# Patient Record
Sex: Female | Born: 1955 | Race: White | Hispanic: No | Smoking: Never smoker
Health system: Southern US, Community
[De-identification: ages and names within clinical notes are randomized; demographics above are authoritative.]

## PROBLEM LIST (undated history)

## (undated) DIAGNOSIS — E119 Type 2 diabetes mellitus without complications: Secondary | ICD-10-CM

## (undated) DIAGNOSIS — T7840XA Allergy, unspecified, initial encounter: Secondary | ICD-10-CM

## (undated) DIAGNOSIS — E785 Hyperlipidemia, unspecified: Secondary | ICD-10-CM

## (undated) DIAGNOSIS — I517 Cardiomegaly: Secondary | ICD-10-CM

## (undated) DIAGNOSIS — I5020 Unspecified systolic (congestive) heart failure: Secondary | ICD-10-CM

## (undated) DIAGNOSIS — K760 Fatty (change of) liver, not elsewhere classified: Secondary | ICD-10-CM

## (undated) DIAGNOSIS — M1990 Unspecified osteoarthritis, unspecified site: Secondary | ICD-10-CM

## (undated) DIAGNOSIS — I700 Atherosclerosis of aorta: Secondary | ICD-10-CM

## (undated) DIAGNOSIS — M51369 Other intervertebral disc degeneration, lumbar region without mention of lumbar back pain or lower extremity pain: Secondary | ICD-10-CM

## (undated) DIAGNOSIS — I447 Left bundle-branch block, unspecified: Secondary | ICD-10-CM

## (undated) DIAGNOSIS — D649 Anemia, unspecified: Secondary | ICD-10-CM

## (undated) DIAGNOSIS — I10 Essential (primary) hypertension: Secondary | ICD-10-CM

## (undated) DIAGNOSIS — O903 Peripartum cardiomyopathy: Secondary | ICD-10-CM

## (undated) DIAGNOSIS — K469 Unspecified abdominal hernia without obstruction or gangrene: Secondary | ICD-10-CM

## (undated) HISTORY — DX: Allergy, unspecified, initial encounter: T78.40XA

## (undated) HISTORY — PX: FRACTURE SURGERY: SHX138

## (undated) HISTORY — PX: HERNIA REPAIR: SHX51

## (undated) HISTORY — DX: Type 2 diabetes mellitus without complications: E11.9

## (undated) HISTORY — PX: COLON SURGERY: SHX602

## (undated) HISTORY — DX: Unspecified osteoarthritis, unspecified site: M19.90

---

## 1999-09-02 HISTORY — PX: ABDOMINAL HERNIA REPAIR: SHX539

## 2004-09-01 HISTORY — PX: OTHER SURGICAL HISTORY: SHX169

## 2005-01-13 ENCOUNTER — Ambulatory Visit: Payer: Self-pay | Admitting: Anesthesiology

## 2005-02-19 ENCOUNTER — Ambulatory Visit: Payer: Self-pay | Admitting: Anesthesiology

## 2005-02-26 ENCOUNTER — Other Ambulatory Visit: Payer: Self-pay

## 2005-03-03 ENCOUNTER — Ambulatory Visit: Payer: Self-pay | Admitting: General Surgery

## 2005-03-05 ENCOUNTER — Ambulatory Visit: Payer: Self-pay | Admitting: General Surgery

## 2005-05-01 ENCOUNTER — Ambulatory Visit: Payer: Self-pay | Admitting: Anesthesiology

## 2005-05-13 ENCOUNTER — Ambulatory Visit: Payer: Self-pay | Admitting: Anesthesiology

## 2005-06-04 ENCOUNTER — Ambulatory Visit: Payer: Self-pay | Admitting: Anesthesiology

## 2011-09-02 HISTORY — PX: WRIST FRACTURE SURGERY: SHX121

## 2013-12-18 ENCOUNTER — Emergency Department (HOSPITAL_COMMUNITY): Payer: Self-pay

## 2013-12-18 ENCOUNTER — Encounter (HOSPITAL_COMMUNITY): Payer: Self-pay | Admitting: Emergency Medicine

## 2013-12-18 ENCOUNTER — Emergency Department (HOSPITAL_COMMUNITY): Payer: MEDICAID

## 2013-12-18 ENCOUNTER — Inpatient Hospital Stay (HOSPITAL_COMMUNITY)
Admission: EM | Admit: 2013-12-18 | Discharge: 2013-12-23 | DRG: 389 | Disposition: A | Discharge status: Home / Self Care | Payer: Self-pay | Attending: General Surgery | Admitting: General Surgery

## 2013-12-18 DIAGNOSIS — K56609 Unspecified intestinal obstruction, unspecified as to partial versus complete obstruction: Principal | ICD-10-CM | POA: Diagnosis present

## 2013-12-18 DIAGNOSIS — Z6841 Body Mass Index (BMI) 40.0 and over, adult: Secondary | ICD-10-CM

## 2013-12-18 DIAGNOSIS — I10 Essential (primary) hypertension: Secondary | ICD-10-CM | POA: Diagnosis present

## 2013-12-18 HISTORY — DX: Cardiomegaly: I51.7

## 2013-12-18 HISTORY — DX: Essential (primary) hypertension: I10

## 2013-12-18 LAB — COMPREHENSIVE METABOLIC PANEL
ALT: 26 U/L (ref 0–35)
AST: 27 U/L (ref 0–37)
Albumin: 3.8 g/dL (ref 3.5–5.2)
Alkaline Phosphatase: 122 U/L — ABNORMAL HIGH (ref 39–117)
BUN: 10 mg/dL (ref 6–23)
CALCIUM: 9.4 mg/dL (ref 8.4–10.5)
CO2: 22 mEq/L (ref 19–32)
Chloride: 104 mEq/L (ref 96–112)
Creatinine, Ser: 0.65 mg/dL (ref 0.50–1.10)
GFR calc Af Amer: >90 mL/min (ref >90)
GFR calc non Af Amer: >90 mL/min (ref >90)
GLUCOSE: 145 mg/dL — AB (ref 70–99)
Potassium: 4.5 mEq/L (ref 3.7–5.3)
SODIUM: 140 meq/L (ref 137–147)
TOTAL PROTEIN: 8 g/dL (ref 6.0–8.3)
Total Bilirubin: 0.4 mg/dL (ref 0.3–1.2)

## 2013-12-18 LAB — I-STAT TROPONIN, ED
TROPONIN I, POC: 0 ng/mL (ref 0.00–0.08)
TROPONIN I, POC: 0 ng/mL (ref 0.00–0.08)

## 2013-12-18 LAB — URINALYSIS, ROUTINE W REFLEX MICROSCOPIC
Bilirubin Urine: NEGATIVE
GLUCOSE, UA: NEGATIVE mg/dL
HGB URINE DIPSTICK: NEGATIVE
KETONES UR: NEGATIVE mg/dL
Leukocytes, UA: NEGATIVE
NITRITE: NEGATIVE
PH: 7.5 (ref 5.0–8.0)
Protein, ur: NEGATIVE mg/dL
SPECIFIC GRAVITY, URINE: 1.016 (ref 1.005–1.030)
Urobilinogen, UA: 0.2 mg/dL (ref 0.0–1.0)

## 2013-12-18 LAB — CBC WITH DIFFERENTIAL/PLATELET
BASOS ABS: 0 10*3/uL (ref 0.0–0.1)
Basophils Relative: 0 % (ref 0–1)
EOS ABS: 0.2 10*3/uL (ref 0.0–0.7)
EOS PCT: 2 % (ref 0–5)
HCT: 37.5 % (ref 36.0–46.0)
Hemoglobin: 12 g/dL (ref 12.0–15.0)
LYMPHS PCT: 15 % (ref 12–46)
Lymphs Abs: 1.7 10*3/uL (ref 0.7–4.0)
MCH: 24.7 pg — AB (ref 26.0–34.0)
MCHC: 32 g/dL (ref 30.0–36.0)
MCV: 77.3 fL — ABNORMAL LOW (ref 78.0–100.0)
Monocytes Absolute: 0.5 10*3/uL (ref 0.1–1.0)
Monocytes Relative: 5 % (ref 3–12)
Neutro Abs: 8.5 10*3/uL — ABNORMAL HIGH (ref 1.7–7.7)
Neutrophils Relative %: 78 % — ABNORMAL HIGH (ref 43–77)
PLATELETS: 398 10*3/uL (ref 150–400)
RBC: 4.85 MIL/uL (ref 3.87–5.11)
RDW: 15.4 % (ref 11.5–15.5)
WBC: 10.9 10*3/uL — AB (ref 4.0–10.5)

## 2013-12-18 LAB — LIPASE, BLOOD: Lipase: 19 U/L (ref 11–59)

## 2013-12-18 MED ORDER — HYDROMORPHONE HCL PF 1 MG/ML IJ SOLN
1.0000 mg | Freq: Once | INTRAMUSCULAR | Status: AC
  Administered 2013-12-18: 1 mg via INTRAVENOUS
  Filled 2013-12-18: qty 1

## 2013-12-18 MED ORDER — PROCHLORPERAZINE EDISYLATE 5 MG/ML IJ SOLN
10.0000 mg | Freq: Once | INTRAMUSCULAR | Status: AC
  Administered 2013-12-18: 10 mg via INTRAVENOUS
  Filled 2013-12-18: qty 2

## 2013-12-18 MED ORDER — SODIUM CHLORIDE 0.9 % IV BOLUS (SEPSIS)
1000.0000 mL | Freq: Once | INTRAVENOUS | Status: AC
  Administered 2013-12-18: 1000 mL via INTRAVENOUS

## 2013-12-18 MED ORDER — ONDANSETRON HCL 4 MG/2ML IJ SOLN: 4.0000 mg | Freq: Once | INTRAMUSCULAR | Status: DC

## 2013-12-18 MED ORDER — SODIUM CHLORIDE 0.9 % IV SOLN: Freq: Once | INTRAVENOUS | Status: DC

## 2013-12-18 MED ORDER — ONDANSETRON HCL 4 MG/2ML IJ SOLN
4.0000 mg | Freq: Once | INTRAMUSCULAR | Status: AC
  Administered 2013-12-18: 4 mg via INTRAVENOUS
  Filled 2013-12-18: qty 2

## 2013-12-18 NOTE — ED Notes (Signed)
Received pt via EMS with c/o Pt c/o mid abdominal pain from belly button radiating downard onset about 3 hours ago. Pt denies n/v.

## 2013-12-18 NOTE — ED Provider Notes (Signed)
CSN: [id number]     Arrival date & time 12/18/13  1843 History   First MD Initiated Contact with Patient 12/18/13 1916     Chief Complaint  Patient presents with  . Abdominal Pain     (Consider location/radiation/quality/duration/timing/severity/associated sxs/prior Treatment) The history is provided by the patient and a relative. No language interpreter was used.    Stacy Martinez Is a 58 year old obese female with a past medical history of hypertension and cardiomegaly who presents the emergency department with chief complaint of abdominal pain, nausea and vomiting.  She has a past surgical history of incarcerated abdominal hernia and mesh repair.  The patient states that at approximately 3 PM this afternoon she had sudden onset, severe abdominal pain.  Patient states he has associated nausea and profuse vomiting.  Chief is located in her umbilicus and it radiates down to her lower abdomen.  She has had some epigastric pain.  She's never had anything like this before.  She denies any flank pain, urinary symptoms.  Patient no longer has her menstrual period.  Patient denies any shortness of breath, chest pain, radiation to the left jaw or shoulder.  She states that her pain is severe, constant with crescendos.  Unable to find a comfortable position.  She is a family history of of kidney stones and biliary colic.  Past Medical History  Diagnosis Date  . Enlarged heart   . Hypertension    Past Surgical History  Procedure Laterality Date  . Colon surgery     No family history on file. History  Substance Use Topics  . Smoking status: Never Smoker   . Smokeless tobacco: Not on file  . Alcohol Use: No   OB History   Grav Para Term Preterm Abortions TAB SAB Ect Mult Living                 Review of Systems  Ten systems reviewed and are negative for acute change, except as noted in the HPI.    Allergies  Review of patient's allergies indicates no known allergies.  Home Medications    Prior to Admission medications   Medication Sig Start Date End Date Taking? Authorizing Provider  amLODipine (NORVASC) 10 MG tablet Take 10 mg by mouth daily.   Yes Historical Provider, MD  carvedilol (COREG) 25 MG tablet Take 25 mg by mouth 2 (two) times daily with a meal.   Yes Historical Provider, MD  enalapril (VASOTEC) 20 MG tablet Take 20 mg by mouth daily.   Yes Historical Provider, MD  furosemide (LASIX) 40 MG tablet Take 40 mg by mouth daily.   Yes Historical Provider, MD   BP 210/85  Pulse 79  Temp(Src) 97.8 F (36.6 C) (Oral)  Resp 18  Ht 5\' 1"  (1.549 m)  Wt 250 lb (113.399 kg)  BMI 47.26 kg/m2  SpO2 99% Physical Exam  Nursing note and vitals reviewed. Constitutional: She is oriented to person, place, and time. No distress.  Pale diaphoretic female, she is able to speak in full sentences.  She is actively vomiting.  HENT:  Head: Normocephalic.  Eyes: Conjunctivae are normal. Pupils are equal, round, and reactive to light.  Neck: Normal range of motion. Neck supple. No JVD present.  Cardiovascular: Normal rate and regular rhythm.   Pulmonary/Chest: Effort normal and breath sounds normal. She has no wheezes. She has no rales.  Abdominal: Soft. There is tenderness (diffuse abdominal tenderness worse in the right upper quadrant.).  Musculoskeletal: Normal range of motion.  She exhibits no edema.  Neurological: She is alert and oriented to person, place, and time.  Skin: She is diaphoretic. There is pallor.    ED Course  Procedures (including critical care time) Labs Review Labs Reviewed  CBC WITH DIFFERENTIAL - Abnormal; Notable for the following:    WBC 10.9 (*)    MCV 77.3 (*)    MCH 24.7 (*)    Neutrophils Relative % 78 (*)    Neutro Abs 8.5 (*)    All other components within normal limits  COMPREHENSIVE METABOLIC PANEL - Abnormal; Notable for the following:    Glucose, Bld 145 (*)    Alkaline Phosphatase 122 (*)    All other components within normal limits   URINALYSIS, ROUTINE W REFLEX MICROSCOPIC  LIPASE, BLOOD    Imaging Review No results found.   EKG Interpretation   Date/Time:  Sunday December 18 2013 22:03:27 EDT Ventricular Rate:  70 PR Interval:  164 QRS Duration: 151 QT Interval:  477 QTC Calculation: 515 R Axis:   123 Text Interpretation:  Sinus rhythm Anterior infarct, old Abnormal T,  consider ischemia, lateral leads Prolonged QT interval No previous tracing  Confirmed by Maryan Rued  MD, WHITNEY ([id number]) on 12/18/2013 11:20:29 PM      MDM   Final diagnoses:  Small bowel obstruction    9:01 PM Filed Vitals:   12/18/13 1847  BP: 210/85  Pulse: 79  Temp: 97.8 F (36.6 C)  Resp: 18   Patient with severe epigastric pain, nausea, vomiting.  Triage labs included CBC and CMP.  She has an elevated alkaline phosphatase and right upper quadrant abdominal pain.  Negative troponin, lipase is pending.  Differential includes biliary colic, developing appendicitis, gastroenteritis, renal colic.    Patient with abnormal EKG and Prolonged QTc at 523ms. Will D/c zofran  Continued nausea. Compazine ordered.   12:11 AM Filed Vitals:   12/18/13 2202 12/18/13 2215 12/18/13 2230 12/18/13 2322  BP: 180/64 181/65 157/78 148/55  Pulse: 66 67 65 63  Temp:      TempSrc:      Resp:  12 11 14   Height:      Weight:      SpO2: 97% 96% 95% 97%   Patient with concern for closed loop bowel obstruction on CT scan. I have consulted wit Dr. Jenel Lucks admit the paient.  i have reviewed labs and imaging with patient. The patient appears reasonably stabilized for admission considering the current resources, flow, and capabilities available in the ED at this time, and I doubt any other [facility] requiring further screening and/or treatment in the ED prior to admission.   Margarita Mail, PA-C 12/20/13 2147

## 2013-12-19 DIAGNOSIS — K56609 Unspecified intestinal obstruction, unspecified as to partial versus complete obstruction: Secondary | ICD-10-CM | POA: Diagnosis present

## 2013-12-19 LAB — CBC
HCT: 34.6 % — ABNORMAL LOW (ref 36.0–46.0)
Hemoglobin: 10.7 g/dL — ABNORMAL LOW (ref 12.0–15.0)
MCH: 24.4 pg — ABNORMAL LOW (ref 26.0–34.0)
MCHC: 30.9 g/dL (ref 30.0–36.0)
MCV: 79 fL (ref 78.0–100.0)
PLATELETS: 323 10*3/uL (ref 150–400)
RBC: 4.38 MIL/uL (ref 3.87–5.11)
RDW: 15.7 % — AB (ref 11.5–15.5)
WBC: 10.6 10*3/uL — AB (ref 4.0–10.5)

## 2013-12-19 LAB — BASIC METABOLIC PANEL
BUN: 10 mg/dL (ref 6–23)
CALCIUM: 8.6 mg/dL (ref 8.4–10.5)
CO2: 22 mEq/L (ref 19–32)
CREATININE: 0.64 mg/dL (ref 0.50–1.10)
Chloride: 102 mEq/L (ref 96–112)
GFR calc non Af Amer: >90 mL/min (ref >90)
Glucose, Bld: 142 mg/dL — ABNORMAL HIGH (ref 70–99)
Potassium: 4.4 mEq/L (ref 3.7–5.3)
SODIUM: 137 meq/L (ref 137–147)

## 2013-12-19 LAB — I-STAT CG4 LACTIC ACID, ED: LACTIC ACID, VENOUS: 1.07 mmol/L (ref 0.5–2.2)

## 2013-12-19 MED ORDER — ENOXAPARIN SODIUM 40 MG/0.4ML ~~LOC~~ SOLN
40.0000 mg | SUBCUTANEOUS | Status: DC
  Administered 2013-12-19 – 2013-12-23 (×5): 40 mg via SUBCUTANEOUS
  Filled 2013-12-19 (×6): qty 0.4

## 2013-12-19 MED ORDER — KCL IN DEXTROSE-NACL 20-5-0.45 MEQ/L-%-% IV SOLN
INTRAVENOUS | Status: DC
  Administered 2013-12-19 – 2013-12-21 (×7): via INTRAVENOUS
  Administered 2013-12-22: 100 mL/h via INTRAVENOUS
  Filled 2013-12-19 (×12): qty 1000

## 2013-12-19 MED ORDER — ONDANSETRON HCL 4 MG/2ML IJ SOLN
4.0000 mg | Freq: Four times a day (QID) | INTRAMUSCULAR | Status: DC | PRN
  Administered 2013-12-19 – 2013-12-22 (×4): 4 mg via INTRAVENOUS
  Filled 2013-12-19 (×4): qty 2

## 2013-12-19 MED ORDER — HYDROMORPHONE HCL PF 1 MG/ML IJ SOLN
1.0000 mg | INTRAMUSCULAR | Status: DC | PRN
  Administered 2013-12-19 – 2013-12-20 (×4): 1 mg via INTRAVENOUS
  Administered 2013-12-20: 2 mg via INTRAVENOUS
  Administered 2013-12-22: 1 mg via INTRAVENOUS
  Filled 2013-12-19 (×3): qty 1
  Filled 2013-12-19: qty 2
  Filled 2013-12-19 (×2): qty 1

## 2013-12-19 MED ORDER — LIDOCAINE VISCOUS 2 % MT SOLN
15.0000 mL | Freq: Once | OROMUCOSAL | Status: DC
  Filled 2013-12-19: qty 15

## 2013-12-19 MED ORDER — LORAZEPAM 2 MG/ML IJ SOLN
1.0000 mg | Freq: Once | INTRAMUSCULAR | Status: AC
  Administered 2013-12-19: 1 mg via INTRAVENOUS
  Filled 2013-12-19: qty 1

## 2013-12-19 MED ORDER — PANTOPRAZOLE SODIUM 40 MG IV SOLR
40.0000 mg | Freq: Every day | INTRAVENOUS | Status: DC
Start: 2013-12-19 — End: 2013-12-23
  Administered 2013-12-19 – 2013-12-22 (×5): 40 mg via INTRAVENOUS
  Filled 2013-12-19 (×6): qty 40

## 2013-12-19 NOTE — Progress Notes (Signed)
[id number]/ pt admitted to [place] room 20  via stretcher from ed/ Pt alert and oriented/ Ng in rt nare with tan drainage/ pt oriented to room but very drowsy/Pt information will need to be reiterated

## 2013-12-19 NOTE — Progress Notes (Signed)
I have seen and examined the patient and agree with the assessment and plans. Not an acute abdomen on exam so will try conservative management with NG and bowel rest  Eusevio Schriver A. Ninfa Linden  MD, FACS

## 2013-12-19 NOTE — Progress Notes (Signed)
Subjective: Patient feels much better today.  No N/V since NG tube placed.  Pain still in the upper abdomen/epigastrium   Objective: Vital signs in last 24 hours: Temp:  [97.6 F (36.4 C)-98.7 F (37.1 C)] 97.6 F (36.4 C) (04/20 0832) Pulse Rate:  [63-79] 75 (04/20 0832) Resp:  [11-19] 18 (04/20 0832) BP: (148-210)/(55-85) 186/78 mmHg (04/20 0832) SpO2:  [92 %-99 %] 92 % (04/20 0832) Weight:  [250 lb (113.399 kg)-251 lb 13.5 oz (114.236 kg)] 251 lb 13.5 oz (114.236 kg) (04/20 0225) Last BM Date: 12/18/13  Intake/Output from previous day: 04/19 0701 - 04/20 0700 In: 400 [I.V.:400] Out: 400 [Emesis/NG output:300] Intake/Output this shift:    PE: Gen:  Alert, NAD, pleasant Abd: Obese, soft, mild tenderness in the upper abdomen, no pain in the lower abdomen, diminished BS, no HSM, NG tube with yellowish drainage at 418mL since insertion   Lab Results:   Recent Labs  12/18/13 1903 12/19/13 0600  WBC 10.9* 10.6*  HGB 12.0 10.7*  HCT 37.5 34.6*  PLT 398 323   BMET  Recent Labs  12/18/13 1903 12/19/13 0600  NA 140 137  K 4.5 4.4  CL 104 102  CO2 22 22  GLUCOSE 145* 142*  BUN 10 10  CREATININE 0.65 0.64  CALCIUM 9.4 8.6   PT/INR No results found for this basename: LABPROT, INR,  in the last 72 hours CMP     Component Value Date/Time   NA 137 12/19/2013 0600   K 4.4 12/19/2013 0600   CL 102 12/19/2013 0600   CO2 22 12/19/2013 0600   GLUCOSE 142* 12/19/2013 0600   BUN 10 12/19/2013 0600   CREATININE 0.64 12/19/2013 0600   CALCIUM 8.6 12/19/2013 0600   PROT 8.0 12/18/2013 1903   ALBUMIN 3.8 12/18/2013 1903   AST 27 12/18/2013 1903   ALT 26 12/18/2013 1903   ALKPHOS 122* 12/18/2013 1903   BILITOT 0.4 12/18/2013 1903   GFRNONAA >90 12/19/2013 0600   GFRAA >90 12/19/2013 0600   Lipase     Component Value Date/Time   LIPASE 19 12/18/2013 2031       Studies/Results: Ct Abdomen Pelvis Wo Contrast  12/18/2013   CLINICAL DATA:  Mid abdominal and periumbilical  pain radiating downward for 3 hr.  EXAM: CT ABDOMEN AND PELVIS WITHOUT CONTRAST  TECHNIQUE: Multidetector CT imaging of the abdomen and pelvis was performed following the standard protocol without intravenous contrast.  COMPARISON:  US ABDOMEN COMPLETE dated 12/18/2013  FINDINGS: The lung bases are clear. No renal, ureteral, or bladder stones are identified. No pyelocaliectasis or ureterectasis.  There is diffuse fatty infiltration throughout the liver with suggestion of focal sparing around the gallbladder fossa. The unenhanced appearance of the spleen, gallbladder, pancreas, adrenal glands, inferior vena cava, and abdominal aorta is unremarkable. Small accessory spleen. Mild prominence of retroperitoneal lymph nodes without pathological enlargement, likely reactive. The stomach is decompressed.  Pelvis: In the left lower quadrant, there is a collection of mildly dilated fluid-filled small bowel with associated mesenteric fluid and edema. The adjacent proximal and distal small bowel loops are decompressed. Appearance is concerning for closed loop obstruction. No definite wall thickening, pneumatosis, or portal venous gas. There is surgical mesh in the anterior abdominal wall extending from the umbilicus into the pelvis consistent with previous hernia repair. Small amount of free fluid in the pelvis is nonspecific but could be reactive. The uterus and ovaries are not enlarged. The appendix is normal. Colon is decompressed without evidence of  diverticulitis. No significant pelvic lymphadenopathy. Spondylolysis with mild spondylolisthesis at L4-5. Degenerative changes in the lower lumbar spine. No destructive bone lesions appreciated.  IMPRESSION: Focal mildly distended fluid-filled small bowel in the left lower quadrant with associated mesenteric fluid and edema. Findings are concerning for closed loop obstruction. Diffuse fatty infiltration of the liver. No renal or ureteral stone or obstruction.   Electronically  Signed   By: Lucienne Capers M.D.   On: 12/18/2013 23:51   Dg Chest 2 View  12/18/2013   CLINICAL DATA:  Abdominal pain  EXAM: CHEST - 2 VIEW  COMPARISON:  None.  FINDINGS: The heart is mildly enlarged. Clear lungs. Normal vascularity. No pleural effusion and no pneumothorax.  IMPRESSION: Cardiomegaly without decompensation.   Electronically Signed   By: Maryclare Bean M.D.   On: 12/18/2013 21:20   US Abdomen Complete  12/18/2013   CLINICAL DATA:  Abdominal pain and vomiting  EXAM: ULTRASOUND ABDOMEN COMPLETE  COMPARISON:  None.  FINDINGS: Gallbladder:  No gallstones or wall thickening visualized. There is no pericholecystic fluid. No sonographic Murphy sign noted.  Common bile duct:  Diameter: 6 mm. There is no intrahepatic, common hepatic, or common bile duct dilatation.  Liver:  Liver shows diffuse increased echogenicity. Near the gallbladder fossa, there is a relative hypoechoic area measuring 2.3 x 2.2 x 1.3 cm.  IVC:  No abnormality visualized.  Pancreas:  Visualized portion unremarkable. Portions of the tail of the pancreas are obscured by gas.  Spleen:  Size and appearance within normal limits.  Right Kidney:  Length: 10.7 cm. Echogenicity within normal limits. No mass or hydronephrosis visualized.  Left Kidney:  Length: 12.1 cm. Echogenicity within normal limits. No mass or hydronephrosis visualized.  Abdominal aorta:  No aneurysm visualized.  Other findings:  No demonstrable ascites.  IMPRESSION: Liver shows diffuse increased echogenicity consistent with fatty change. There is a relative hypoechoic area near the gallbladder fossa which may represent fatty sparing. A focal mass in this area, however, cannot be excluded. Given this circumstance, would advise nonemergent CT are MRI pre and post-contrast to further evaluate. MR would be the abdomen study to evaluate this lesion if patient is able.  Portions the pancreatic tail are obscured by gas. Visualized portions of the pancreas appear normal.  Study is  otherwise unremarkable.   Electronically Signed   By: Lowella Grip M.D.   On: 12/18/2013 21:58    Anti-infectives: Anti-infectives   None       Assessment/Plan SBO with possible closed loop obstruction H/o some abdominal surgery in 2006  Plan: 1.  Conservative management:  IVF, pain control, antiemetics, NG tube/bowel rest 2.  Hopefully this will resolve with conservative management along 3.  Ambulate and IS 4.  SCD's and lovenox     LOS: 1 day    Coralie Keens 12/19/2013, 9:07 AM Pager: [phone]

## 2013-12-19 NOTE — H&P (Signed)
Stacy Martinez is an 58 y.o. female.   Chief Complaint: Abdominal pain and nausea and vomiting HPI: The patient developed acute onset of diffuse mid abdominal pain associated with nausea and vomiting.This pain was severe on arrival, but has subsided with the administration of some pain medication and rehydration.  CT scan performed was concerning for a closed loop obstruction.  Surgery was called  Past Medical History  Diagnosis Date  . Enlarged heart   . Hypertension     Past Surgical History  Procedure Laterality Date  . Colon surgery      No family history on file. Social History:  reports that she has never smoked. She does not have any smokeless tobacco history on file. She reports that she does not drink alcohol or use illicit drugs.  Allergies: No Known Allergies   (Not in a hospital admission)  Results for orders placed during the hospital encounter of 12/18/13 (from the past 48 hour(s))  CBC WITH DIFFERENTIAL     Status: Abnormal   Collection Time    12/18/13  7:03 PM      Result Value Ref Range   WBC 10.9 (*) 4.0 - 10.5 K/uL   RBC 4.85  3.87 - 5.11 MIL/uL   Hemoglobin 12.0  12.0 - 15.0 g/dL   HCT 29.0  89.6 - 48.4 %   MCV 77.3 (*) 78.0 - 100.0 fL   MCH 24.7 (*) 26.0 - 34.0 pg   MCHC 32.0  30.0 - 36.0 g/dL   RDW 74.2  29.4 - 63.9 %   Platelets 398  150 - 400 K/uL   Neutrophils Relative % 78 (*) 43 - 77 %   Neutro Abs 8.5 (*) 1.7 - 7.7 K/uL   Lymphocytes Relative 15  12 - 46 %   Lymphs Abs 1.7  0.7 - 4.0 K/uL   Monocytes Relative 5  3 - 12 %   Monocytes Absolute 0.5  0.1 - 1.0 K/uL   Eosinophils Relative 2  0 - 5 %   Eosinophils Absolute 0.2  0.0 - 0.7 K/uL   Basophils Relative 0  0 - 1 %   Basophils Absolute 0.0  0.0 - 0.1 K/uL  COMPREHENSIVE METABOLIC PANEL     Status: Abnormal   Collection Time    12/18/13  7:03 PM      Result Value Ref Range   Sodium 140  137 - 147 mEq/L   Potassium 4.5  3.7 - 5.3 mEq/L   Chloride 104  96 - 112 mEq/L   CO2 22  19 - 32  mEq/L   Glucose, Bld 145 (*) 70 - 99 mg/dL   BUN 10  6 - 23 mg/dL   Creatinine, Ser 2.02  0.50 - 1.10 mg/dL   Calcium 9.4  8.4 - 25.7 mg/dL   Total Protein 8.0  6.0 - 8.3 g/dL   Albumin 3.8  3.5 - 5.2 g/dL   AST 27  0 - 37 U/L   ALT 26  0 - 35 U/L   Alkaline Phosphatase 122 (*) 39 - 117 U/L   Total Bilirubin 0.4  0.3 - 1.2 mg/dL   GFR calc non Af Amer >90  >90 mL/min   GFR calc Af Amer >90  >90 mL/min   Comment: (NOTE)     The eGFR has been calculated using the CKD EPI equation.     This calculation has not been validated in all clinical situations.     eGFR's persistently <90 mL/min signify  possible Chronic Kidney     Disease.  LIPASE, BLOOD     Status: None   Collection Time    12/18/13  8:31 PM      Result Value Ref Range   Lipase 19  11 - 59 U/L  I-STAT TROPOININ, ED     Status: None   Collection Time    12/18/13  8:35 PM      Result Value Ref Range   Troponin i, poc 0.00  0.00 - 0.08 ng/mL   Comment 3            Comment: Due to the release kinetics of cTnI,     a negative result within the first hours     of the onset of symptoms does not rule out     myocardial infarction with certainty.     If myocardial infarction is still suspected,     repeat the test at appropriate intervals.  URINALYSIS, ROUTINE W REFLEX MICROSCOPIC     Status: None   Collection Time    12/18/13 10:55 PM      Result Value Ref Range   Color, Urine YELLOW  YELLOW   APPearance CLEAR  CLEAR   Specific Gravity, Urine 1.016  1.005 - 1.030   pH 7.5  5.0 - 8.0   Glucose, UA NEGATIVE  NEGATIVE mg/dL   Hgb urine dipstick NEGATIVE  NEGATIVE   Bilirubin Urine NEGATIVE  NEGATIVE   Ketones, ur NEGATIVE  NEGATIVE mg/dL   Protein, ur NEGATIVE  NEGATIVE mg/dL   Urobilinogen, UA 0.2  0.0 - 1.0 mg/dL   Nitrite NEGATIVE  NEGATIVE   Leukocytes, UA NEGATIVE  NEGATIVE   Comment: MICROSCOPIC NOT DONE ON URINES WITH NEGATIVE PROTEIN, BLOOD, LEUKOCYTES, NITRITE, OR GLUCOSE <1000 mg/dL.  Rosezena Sensor, ED      Status: None   Collection Time    12/18/13 11:33 PM      Result Value Ref Range   Troponin i, poc 0.00  0.00 - 0.08 ng/mL   Comment 3            Comment: Due to the release kinetics of cTnI,     a negative result within the first hours     of the onset of symptoms does not rule out     myocardial infarction with certainty.     If myocardial infarction is still suspected,     repeat the test at appropriate intervals.   Ct Abdomen Pelvis Wo Contrast  12/18/2013   CLINICAL DATA:  Mid abdominal and periumbilical pain radiating downward for 3 hr.  EXAM: CT ABDOMEN AND PELVIS WITHOUT CONTRAST  TECHNIQUE: Multidetector CT imaging of the abdomen and pelvis was performed following the standard protocol without intravenous contrast.  COMPARISON:  US ABDOMEN COMPLETE dated 12/18/2013  FINDINGS: The lung bases are clear. No renal, ureteral, or bladder stones are identified. No pyelocaliectasis or ureterectasis.  There is diffuse fatty infiltration throughout the liver with suggestion of focal sparing around the gallbladder fossa. The unenhanced appearance of the spleen, gallbladder, pancreas, adrenal glands, inferior vena cava, and abdominal aorta is unremarkable. Small accessory spleen. Mild prominence of retroperitoneal lymph nodes without pathological enlargement, likely reactive. The stomach is decompressed.  Pelvis: In the left lower quadrant, there is a collection of mildly dilated fluid-filled small bowel with associated mesenteric fluid and edema. The adjacent proximal and distal small bowel loops are decompressed. Appearance is concerning for closed loop obstruction. No definite wall thickening, pneumatosis, or portal venous  gas. There is surgical mesh in the anterior abdominal wall extending from the umbilicus into the pelvis consistent with previous hernia repair. Small amount of free fluid in the pelvis is nonspecific but could be reactive. The uterus and ovaries are not enlarged. The appendix is  normal. Colon is decompressed without evidence of diverticulitis. No significant pelvic lymphadenopathy. Spondylolysis with mild spondylolisthesis at L4-5. Degenerative changes in the lower lumbar spine. No destructive bone lesions appreciated.  IMPRESSION: Focal mildly distended fluid-filled small bowel in the left lower quadrant with associated mesenteric fluid and edema. Findings are concerning for closed loop obstruction. Diffuse fatty infiltration of the liver. No renal or ureteral stone or obstruction.   Electronically Signed   By: Lucienne Capers M.D.   On: 12/18/2013 23:51   Dg Chest 2 View  12/18/2013   CLINICAL DATA:  Abdominal pain  EXAM: CHEST - 2 VIEW  COMPARISON:  None.  FINDINGS: The heart is mildly enlarged. Clear lungs. Normal vascularity. No pleural effusion and no pneumothorax.  IMPRESSION: Cardiomegaly without decompensation.   Electronically Signed   By: Maryclare Bean M.D.   On: 12/18/2013 21:20   US Abdomen Complete  12/18/2013   CLINICAL DATA:  Abdominal pain and vomiting  EXAM: ULTRASOUND ABDOMEN COMPLETE  COMPARISON:  None.  FINDINGS: Gallbladder:  No gallstones or wall thickening visualized. There is no pericholecystic fluid. No sonographic Murphy sign noted.  Common bile duct:  Diameter: 6 mm. There is no intrahepatic, common hepatic, or common bile duct dilatation.  Liver:  Liver shows diffuse increased echogenicity. Near the gallbladder fossa, there is a relative hypoechoic area measuring 2.3 x 2.2 x 1.3 cm.  IVC:  No abnormality visualized.  Pancreas:  Visualized portion unremarkable. Portions of the tail of the pancreas are obscured by gas.  Spleen:  Size and appearance within normal limits.  Right Kidney:  Length: 10.7 cm. Echogenicity within normal limits. No mass or hydronephrosis visualized.  Left Kidney:  Length: 12.1 cm. Echogenicity within normal limits. No mass or hydronephrosis visualized.  Abdominal aorta:  No aneurysm visualized.  Other findings:  No demonstrable ascites.   IMPRESSION: Liver shows diffuse increased echogenicity consistent with fatty change. There is a relative hypoechoic area near the gallbladder fossa which may represent fatty sparing. A focal mass in this area, however, cannot be excluded. Given this circumstance, would advise nonemergent CT are MRI pre and post-contrast to further evaluate. MR would be the abdomen study to evaluate this lesion if patient is able.  Portions the pancreatic tail are obscured by gas. Visualized portions of the pancreas appear normal.  Study is otherwise unremarkable.   Electronically Signed   By: Lowella Grip M.D.   On: 12/18/2013 21:58    Review of Systems  Constitutional: Negative for fever and chills.  Gastrointestinal: Positive for nausea, vomiting and abdominal pain.  All other systems reviewed and are negative.   Blood pressure 148/55, pulse 63, temperature 97.8 F (36.6 C), temperature source Oral, resp. rate 14, height $RemoveBe'5\' 1"'MaNWBalEO$  (1.549 m), weight 113.399 kg (250 lb), SpO2 97.00%. Physical Exam  Vitals reviewed. Constitutional: She is oriented to person, place, and time. She appears well-developed and well-nourished.  Morbidly obese  HENT:  Head: Normocephalic and atraumatic.  Eyes: Conjunctivae and EOM are normal. Pupils are equal, round, and reactive to light.  Neck: Normal range of motion. Neck supple.  Cardiovascular: Normal rate, regular rhythm, normal heart sounds and intact distal pulses.   Respiratory: Effort normal.  GI: Normal appearance and  bowel sounds are normal. She exhibits distension. There is generalized tenderness (very minimal tenderness on examination).  Musculoskeletal: Normal range of motion.  Neurological: She is alert and oriented to person, place, and time. She has normal reflexes.  Skin: Skin is warm and dry.  Psychiatric: She has a normal mood and affect. Her behavior is normal. Judgment and thought content normal.     Assessment/Plan Small bowel obstruction with possible  closed loop obstruction, WBC not markedly abnormal, No acidotic No peritoneal signs on examination.  NGT IV hydrations. Re-evaluate for continued problems in the future. No X-rays today, but may get some tomorrow.  Gwenyth Ober 12/19/2013, 12:52 AM

## 2013-12-20 LAB — BASIC METABOLIC PANEL
BUN: 8 mg/dL (ref 6–23)
CHLORIDE: 101 meq/L (ref 96–112)
CO2: 23 meq/L (ref 19–32)
Calcium: 9.1 mg/dL (ref 8.4–10.5)
Creatinine, Ser: 0.74 mg/dL (ref 0.50–1.10)
GFR calc non Af Amer: >90 mL/min (ref >90)
Glucose, Bld: 125 mg/dL — ABNORMAL HIGH (ref 70–99)
POTASSIUM: 5.1 meq/L (ref 3.7–5.3)
Sodium: 138 mEq/L (ref 137–147)

## 2013-12-20 LAB — CBC
HEMATOCRIT: 36.5 % (ref 36.0–46.0)
HEMOGLOBIN: 11.4 g/dL — AB (ref 12.0–15.0)
MCH: 24.7 pg — ABNORMAL LOW (ref 26.0–34.0)
MCHC: 31.2 g/dL (ref 30.0–36.0)
MCV: 79 fL (ref 78.0–100.0)
Platelets: 364 10*3/uL (ref 150–400)
RBC: 4.62 MIL/uL (ref 3.87–5.11)
RDW: 15.7 % — ABNORMAL HIGH (ref 11.5–15.5)
WBC: 11.6 10*3/uL — AB (ref 4.0–10.5)

## 2013-12-20 NOTE — Progress Notes (Signed)
I have seen and examined the patient and agree with the assessment and plans. Will continue conservative management and repeat films in the morning  Odalis Jordan A. Ninfa Linden  MD, FACS

## 2013-12-20 NOTE — Progress Notes (Signed)
Subjective: Pt still tender in her upper abdomen, no N/V.  Has her tube clamped right now because she was up ambulating and she's not having any increase in symptoms.  No flatus or BM yet.  Had 822mL/ 24 hours.  Objective: Vital signs in last 24 hours: Temp:  [97.6 F (36.4 C)-98.8 F (37.1 C)] 98 F (36.7 C) (04/21 0548) Pulse Rate:  [69-89] 84 (04/21 0548) Resp:  [16-20] 20 (04/21 0548) BP: (148-209)/(57-93) 158/93 mmHg (04/21 0548) SpO2:  [92 %-97 %] 96 % (04/21 0548) Last BM Date: 12/18/13  Intake/Output from previous day: 04/20 0701 - 04/21 0700 In: 4046 [P.O.:240; I.V.:3806] Out: 500 [Emesis/NG output:500] Intake/Output this shift:    PE: Gen:  Alert, NAD, pleasant Abd: Soft, mild distension and pain in epigastrium, better BS today, no HSM, abdominal scars noted (low midline and low horizontal)   Lab Results:   Recent Labs  12/18/13 1903 12/19/13 0600  WBC 10.9* 10.6*  HGB 12.0 10.7*  HCT 37.5 34.6*  PLT 398 323   BMET  Recent Labs  12/18/13 1903 12/19/13 0600  NA 140 137  K 4.5 4.4  CL 104 102  CO2 22 22  GLUCOSE 145* 142*  BUN 10 10  CREATININE 0.65 0.64  CALCIUM 9.4 8.6   PT/INR No results found for this basename: LABPROT, INR,  in the last 72 hours CMP     Component Value Date/Time   NA 137 12/19/2013 0600   K 4.4 12/19/2013 0600   CL 102 12/19/2013 0600   CO2 22 12/19/2013 0600   GLUCOSE 142* 12/19/2013 0600   BUN 10 12/19/2013 0600   CREATININE 0.64 12/19/2013 0600   CALCIUM 8.6 12/19/2013 0600   PROT 8.0 12/18/2013 1903   ALBUMIN 3.8 12/18/2013 1903   AST 27 12/18/2013 1903   ALT 26 12/18/2013 1903   ALKPHOS 122* 12/18/2013 1903   BILITOT 0.4 12/18/2013 1903   GFRNONAA >90 12/19/2013 0600   GFRAA >90 12/19/2013 0600   Lipase     Component Value Date/Time   LIPASE 19 12/18/2013 2031       Studies/Results: Ct Abdomen Pelvis Wo Contrast  12/18/2013   CLINICAL DATA:  Mid abdominal and periumbilical pain radiating downward for 3 hr.   EXAM: CT ABDOMEN AND PELVIS WITHOUT CONTRAST  TECHNIQUE: Multidetector CT imaging of the abdomen and pelvis was performed following the standard protocol without intravenous contrast.  COMPARISON:  US ABDOMEN COMPLETE dated 12/18/2013  FINDINGS: The lung bases are clear. No renal, ureteral, or bladder stones are identified. No pyelocaliectasis or ureterectasis.  There is diffuse fatty infiltration throughout the liver with suggestion of focal sparing around the gallbladder fossa. The unenhanced appearance of the spleen, gallbladder, pancreas, adrenal glands, inferior vena cava, and abdominal aorta is unremarkable. Small accessory spleen. Mild prominence of retroperitoneal lymph nodes without pathological enlargement, likely reactive. The stomach is decompressed.  Pelvis: In the left lower quadrant, there is a collection of mildly dilated fluid-filled small bowel with associated mesenteric fluid and edema. The adjacent proximal and distal small bowel loops are decompressed. Appearance is concerning for closed loop obstruction. No definite wall thickening, pneumatosis, or portal venous gas. There is surgical mesh in the anterior abdominal wall extending from the umbilicus into the pelvis consistent with previous hernia repair. Small amount of free fluid in the pelvis is nonspecific but could be reactive. The uterus and ovaries are not enlarged. The appendix is normal. Colon is decompressed without evidence of diverticulitis. No significant pelvic lymphadenopathy.  Spondylolysis with mild spondylolisthesis at L4-5. Degenerative changes in the lower lumbar spine. No destructive bone lesions appreciated.  IMPRESSION: Focal mildly distended fluid-filled small bowel in the left lower quadrant with associated mesenteric fluid and edema. Findings are concerning for closed loop obstruction. Diffuse fatty infiltration of the liver. No renal or ureteral stone or obstruction.   Electronically Signed   By: Lucienne Capers M.D.    On: 12/18/2013 23:51   Dg Chest 2 View  12/18/2013   CLINICAL DATA:  Abdominal pain  EXAM: CHEST - 2 VIEW  COMPARISON:  None.  FINDINGS: The heart is mildly enlarged. Clear lungs. Normal vascularity. No pleural effusion and no pneumothorax.  IMPRESSION: Cardiomegaly without decompensation.   Electronically Signed   By: Maryclare Bean M.D.   On: 12/18/2013 21:20   US Abdomen Complete  12/18/2013   CLINICAL DATA:  Abdominal pain and vomiting  EXAM: ULTRASOUND ABDOMEN COMPLETE  COMPARISON:  None.  FINDINGS: Gallbladder:  No gallstones or wall thickening visualized. There is no pericholecystic fluid. No sonographic Murphy sign noted.  Common bile duct:  Diameter: 6 mm. There is no intrahepatic, common hepatic, or common bile duct dilatation.  Liver:  Liver shows diffuse increased echogenicity. Near the gallbladder fossa, there is a relative hypoechoic area measuring 2.3 x 2.2 x 1.3 cm.  IVC:  No abnormality visualized.  Pancreas:  Visualized portion unremarkable. Portions of the tail of the pancreas are obscured by gas.  Spleen:  Size and appearance within normal limits.  Right Kidney:  Length: 10.7 cm. Echogenicity within normal limits. No mass or hydronephrosis visualized.  Left Kidney:  Length: 12.1 cm. Echogenicity within normal limits. No mass or hydronephrosis visualized.  Abdominal aorta:  No aneurysm visualized.  Other findings:  No demonstrable ascites.  IMPRESSION: Liver shows diffuse increased echogenicity consistent with fatty change. There is a relative hypoechoic area near the gallbladder fossa which may represent fatty sparing. A focal mass in this area, however, cannot be excluded. Given this circumstance, would advise nonemergent CT are MRI pre and post-contrast to further evaluate. MR would be the abdomen study to evaluate this lesion if patient is able.  Portions the pancreatic tail are obscured by gas. Visualized portions of the pancreas appear normal.  Study is otherwise unremarkable.    Electronically Signed   By: Lowella Grip M.D.   On: 12/18/2013 21:58    Anti-infectives: Anti-infectives   None       Assessment/Plan SBO with possible closed loop obstruction  H/o multiple abdominal surgeries -H/o 2 c-sections -H/o 1 open incisional hernia repair during most recent c-section -H/o laparoscopic incisional hernia repair for recurrent hernia -H/o open midline incisional hernia repair in 2006 (total of 4 previous surgeries)  Plan:  1. Conservative management for now given no evidence of acute abdomen or peritonitis: IVF, pain control, antiemetics, NG tube/bowel rest  2. Hopefully this will resolve with conservative management alone 3. Ambulate and IS  4. SCD's and lovenox 5. Continue NG tube for now even though tolerating NG clamped temporarily and await bowel function     LOS: 2 days    Coralie Keens 12/20/2013, 7:49 AM Pager: [phone]

## 2013-12-21 ENCOUNTER — Inpatient Hospital Stay (HOSPITAL_COMMUNITY): Payer: Self-pay

## 2013-12-21 LAB — BASIC METABOLIC PANEL
BUN: 6 mg/dL (ref 6–23)
CHLORIDE: 100 meq/L (ref 96–112)
CO2: 23 mEq/L (ref 19–32)
Calcium: 8.9 mg/dL (ref 8.4–10.5)
Creatinine, Ser: 0.73 mg/dL (ref 0.50–1.10)
GFR calc Af Amer: >90 mL/min (ref >90)
GFR calc non Af Amer: >90 mL/min (ref >90)
GLUCOSE: 139 mg/dL — AB (ref 70–99)
Potassium: 4.4 mEq/L (ref 3.7–5.3)
Sodium: 138 mEq/L (ref 137–147)

## 2013-12-21 LAB — CBC
HEMATOCRIT: 34 % — AB (ref 36.0–46.0)
Hemoglobin: 10.6 g/dL — ABNORMAL LOW (ref 12.0–15.0)
MCH: 24.5 pg — AB (ref 26.0–34.0)
MCHC: 31.2 g/dL (ref 30.0–36.0)
MCV: 78.7 fL (ref 78.0–100.0)
Platelets: 328 10*3/uL (ref 150–400)
RBC: 4.32 MIL/uL (ref 3.87–5.11)
RDW: 15.5 % (ref 11.5–15.5)
WBC: 10.2 10*3/uL (ref 4.0–10.5)

## 2013-12-21 MED ORDER — METOPROLOL TARTRATE 1 MG/ML IV SOLN
2.0000 mg | Freq: Four times a day (QID) | INTRAVENOUS | Status: DC | PRN
  Administered 2013-12-21: 2 mg via INTRAVENOUS
  Filled 2013-12-21: qty 5

## 2013-12-21 MED ORDER — LORAZEPAM 2 MG/ML IJ SOLN
0.5000 mg | Freq: Once | INTRAMUSCULAR | Status: AC
  Administered 2013-12-21: 0.5 mg via INTRAVENOUS
  Filled 2013-12-21: qty 1

## 2013-12-21 NOTE — Progress Notes (Signed)
I have seen and examined the patient and agree with the assessment and plans. Will need surgery if she fails to improve  Mckaila Duffus A. Ninfa Linden  MD, FACS

## 2013-12-21 NOTE — ED Provider Notes (Signed)
Medical screening examination/treatment/procedure(s) were conducted as a shared visit with non-physician practitioner(s) and myself.  I personally evaluated the patient during the encounter.   EKG Interpretation   Date/Time:  Sunday December 18 2013 22:03:27 EDT Ventricular Rate:  70 PR Interval:  164 QRS Duration: 151 QT Interval:  477 QTC Calculation: 515 R Axis:   123 Text Interpretation:  Sinus rhythm Anterior infarct, old Abnormal T,  consider ischemia, lateral leads Prolonged QT interval No previous tracing  Confirmed by Maryan Rued  MD, Delaine Canter ([id number]) on 12/18/2013 11:20:29 PM      Pt with abd pain that started abruptly and vomiting.  Periumbilical pain with some improvement with pain meds.  nO fever but leukocytosis and CT showed concern for closed loop bowel obstruction.  Pt admitted for further care.  Blanchie Dessert, MD 12/21/13 1539

## 2013-12-21 NOTE — Progress Notes (Signed)
Subjective: Pt feels much better.  She's having no N/V or abdominal pain.  She ambulated well yesterday and thinks that helped.  She's having flatus, getting an appetite.  Urinating well, no BM yet.  KUB still with dilated loops, but clinically significantly improved.  Objective: Vital signs in last 24 hours: Temp:  [97.7 F (36.5 C)-98.1 F (36.7 C)] 98.1 F (36.7 C) Jan 21, 2023 0530) Pulse Rate:  [78-92] 85 2023-01-21 0530) Resp:  [16-20] 16 2023/01/21 0530) BP: (159-165)/(73-87) 159/79 mmHg 01/21/23 0530) SpO2:  [96 %-98 %] 96 % 01-21-2023 0530) Last BM Date: 12/18/13  Intake/Output from previous day: 04/21 0701 - 2023/01/21 0700 In: 1000 [I.V.:1000] Out: 300 [Emesis/NG output:300] Intake/Output this shift:    PE: Gen:  Alert, NAD, pleasant Abd: Soft, ND, no tenderness in the epigastrium like before, great BS today, no HSM, abdominal scars noted (low midline and low horizontal)    Lab Results:   Recent Labs  12/20/13 0920 01-20-2014 0415  WBC 11.6* 10.2  HGB 11.4* 10.6*  HCT 36.5 34.0*  PLT 364 328   BMET  Recent Labs  12/20/13 0920 01-20-2014 0415  NA 138 138  K 5.1 4.4  CL 101 100  CO2 23 23  GLUCOSE 125* 139*  BUN 8 6  CREATININE 0.74 0.73  CALCIUM 9.1 8.9   PT/INR No results found for this basename: LABPROT, INR,  in the last 72 hours CMP     Component Value Date/Time   NA 138 Jan 20, 2014 0415   K 4.4 Jan 20, 2014 0415   CL 100 01/20/14 0415   CO2 23 01/20/14 0415   GLUCOSE 139* 2014/01/20 0415   BUN 6 01/20/2014 0415   CREATININE 0.73 20-Jan-2014 0415   CALCIUM 8.9 2014/01/20 0415   PROT 8.0 12/18/2013 1903   ALBUMIN 3.8 12/18/2013 1903   AST 27 12/18/2013 1903   ALT 26 12/18/2013 1903   ALKPHOS 122* 12/18/2013 1903   BILITOT 0.4 12/18/2013 1903   GFRNONAA >90 2014/01/20 0415   GFRAA >90 Jan 20, 2014 0415   Lipase     Component Value Date/Time   LIPASE 19 12/18/2013 2031       Studies/Results: Dg Abd 2 Views  Jan 20, 2014   CLINICAL DATA:  sbo  EXAM: ABDOMEN - 2 VIEW   COMPARISON:  CT ABD/PELV WO CM dated 12/18/2013  FINDINGS: An NG tube is appreciated coursing along the expected region of the stomach. Compared to prior CT focal dilated loops of small bowel appreciated again within the left upper quadrant. There is otherwise a paucity of bowel gas. Degenerative changes identified within the lower lumbar spine.  IMPRESSION: Persistent focal area of dilated loops of small bowel left upper quadrant. Stable when compared prior CT.   Electronically Signed   By: Margaree Mackintosh M.D.   On: 01/20/2014 08:30    Anti-infectives: Anti-infectives   None       Assessment/Plan SBO with possible closed loop obstruction  H/o multiple abdominal surgeries  -H/o 2 c-sections  -H/o 1 open incisional hernia repair during most recent c-section  -H/o laparoscopic incisional hernia repair for recurrent hernia  -H/o open midline incisional hernia repair in 2006  (total of 4 previous surgeries)   Plan:  1. Conservative management for now given no evidence of acute abdomen or peritonitis: IVF, pain control, antiemetics, NG tube/bowel rest  2. Seems to be resolving clinically, but not yet radiographically, she's having flatus and pain is essentially gone, NG output was only 318mL/24 hours and likely mostly ice chips 3.  Ambulate and IS  4. SCD's and lovenox  5. Clamp NG tube, clamp trials, allow 44mL clear liquids per hour during trial, if tolerates well and NG output <221mL then d/c ng tube and start clears.  If she doesn't tolerate clamping trials she may likely need surgery tomorrow.     LOS: 3 days    Coralie Keens 12/21/2013, 9:03 AM Pager: [phone]

## 2013-12-22 DIAGNOSIS — I10 Essential (primary) hypertension: Secondary | ICD-10-CM

## 2013-12-22 MED ORDER — ENALAPRIL MALEATE 20 MG PO TABS
20.0000 mg | ORAL_TABLET | Freq: Every day | ORAL | Status: DC
  Administered 2013-12-22 – 2013-12-23 (×2): 20 mg via ORAL
  Filled 2013-12-22 (×2): qty 1

## 2013-12-22 MED ORDER — AMLODIPINE BESYLATE 10 MG PO TABS
10.0000 mg | ORAL_TABLET | Freq: Every day | ORAL | Status: DC
  Administered 2013-12-22 – 2013-12-23 (×2): 10 mg via ORAL
  Filled 2013-12-22 (×2): qty 1

## 2013-12-22 MED ORDER — FUROSEMIDE 40 MG PO TABS
40.0000 mg | ORAL_TABLET | Freq: Every day | ORAL | Status: DC
  Administered 2013-12-22 – 2013-12-23 (×2): 40 mg via ORAL
  Filled 2013-12-22 (×2): qty 1

## 2013-12-22 MED ORDER — CARVEDILOL 25 MG PO TABS
25.0000 mg | ORAL_TABLET | Freq: Two times a day (BID) | ORAL | Status: DC
  Administered 2013-12-22 – 2013-12-23 (×3): 25 mg via ORAL
  Filled 2013-12-22 (×5): qty 1

## 2013-12-22 NOTE — Progress Notes (Signed)
Patient ID: Stacy Martinez, female   DOB: 06-Nov-1955, 58 y.o.   MRN: [id number]    Subjective: Pt feels well.  Lots of flatus, no BM, no nausea with NGT clamped since yesterday.  Objective: Vital signs in last 24 hours: Temp:  [97.5 F (36.4 C)-98.3 F (36.8 C)] 97.5 F (36.4 C) (04/23 0542) Pulse Rate:  [74-92] 77 (04/23 0542) Resp:  [16-18] 17 (04/23 0542) BP: (156-186)/(72-117) 156/77 mmHg (04/23 0542) SpO2:  [98 %-100 %] 100 % (04/23 0542) Last BM Date: 12/18/13  Intake/Output from previous day: 2023-01-02 0701 - 04/23 0700 In: 2040 [P.O.:240; I.V.:1800] Out: 150 [Emesis/NG output:150] Intake/Output this shift:    PE: Abd: soft, NT, obese, +BS, NGT with no residual Heart: regular  Lab Results:   Recent Labs  12/20/13 0920 2014/01/01 0415  WBC 11.6* 10.2  HGB 11.4* 10.6*  HCT 36.5 34.0*  PLT 364 328   BMET  Recent Labs  12/20/13 0920 01-01-14 0415  NA 138 138  K 5.1 4.4  CL 101 100  CO2 23 23  GLUCOSE 125* 139*  BUN 8 6  CREATININE 0.74 0.73  CALCIUM 9.1 8.9   PT/INR No results found for this basename: LABPROT, INR,  in the last 72 hours CMP     Component Value Date/Time   NA 138 Jan 01, 2014 0415   K 4.4 2014-01-01 0415   CL 100 01/01/2014 0415   CO2 23 01/01/14 0415   GLUCOSE 139* Jan 01, 2014 0415   BUN 6 01/01/2014 0415   CREATININE 0.73 2014/01/01 0415   CALCIUM 8.9 Jan 01, 2014 0415   PROT 8.0 12/18/2013 1903   ALBUMIN 3.8 12/18/2013 1903   AST 27 12/18/2013 1903   ALT 26 12/18/2013 1903   ALKPHOS 122* 12/18/2013 1903   BILITOT 0.4 12/18/2013 1903   GFRNONAA >90 01/01/14 0415   GFRAA >90 2014/01/01 0415   Lipase     Component Value Date/Time   LIPASE 19 12/18/2013 2031       Studies/Results: Dg Abd 2 Views  01-01-14   CLINICAL DATA:  sbo  EXAM: ABDOMEN - 2 VIEW  COMPARISON:  CT ABD/PELV WO CM dated 12/18/2013  FINDINGS: An NG tube is appreciated coursing along the expected region of the stomach. Compared to prior CT focal dilated loops of small bowel  appreciated again within the left upper quadrant. There is otherwise a paucity of bowel gas. Degenerative changes identified within the lower lumbar spine.  IMPRESSION: Persistent focal area of dilated loops of small bowel left upper quadrant. Stable when compared prior CT.   Electronically Signed   By: Margaree Mackintosh M.D.   On: Jan 01, 2014 08:30    Anti-infectives: Anti-infectives   None       Assessment/Plan  1. SBO, improving 2. HTN  Plan: 1. Dc NGT and give clear liquids 2. Resume home HTN meds   LOS: 4 days    Henreitta Cea 12/22/2013, 7:44 AM Pager: [phone]

## 2013-12-22 NOTE — Progress Notes (Signed)
Patient has tolerated clear liquids without difficulty.  No complaints of pain or nausea on this shift.  Has had two BM's this shift.

## 2013-12-22 NOTE — Progress Notes (Signed)
I have seen and examined the patient and agree with the assessment and plans. Will remove NG  Chord Takahashi A. Ninfa Linden  MD, FACS

## 2013-12-23 MED ORDER — PANTOPRAZOLE SODIUM 40 MG PO TBEC: 40.0000 mg | DELAYED_RELEASE_TABLET | Freq: Every day | ORAL | Status: DC

## 2013-12-23 NOTE — Discharge Summary (Signed)
Physician Discharge Summary  Stacy Martinez VQM:[id number] DOB: September 10, 1955 DOA: 12/18/2013  PCP: No primary provider on file.  Consultation: none  Admit date: 12/18/2013 Discharge date: 12/23/2013  Recommendations for Outpatient Follow-up:   Follow-up Information   Follow up with [name], [name], MD. (As needed)    Specialty:  General Surgery   Contact information:   [place], [place], [place] [address] [phone]      Discharge Diagnoses:  1. SBO 2. HTN   Surgical Procedure: none  Discharge Condition: stable Disposition: home  Diet recommendation: high fiber  Filed Weights   12/18/13 1850 12/19/13 0225  Weight: 250 lb (113.399 kg) 251 lb 13.5 oz (114.236 kg)     Filed Vitals:   12/23/13 0535  BP: 117/59  Pulse: 77  Temp: 98.4 F (36.9 C)  Resp: 20     Hospital Course:  Stacy Martinez presented with diffuse abdominal pain associated with nausea and vomiting.  CT of abdomen showed a possible closed loop obstruction.  She was admitted for IV hydration, NGT decompression.  Follow up XR showed improving obstruction.  NGT was removed and she was started on PO trials which she tolerated well.  She began having BMS and passing flatus.  Her vital signs remained stable.  On HD#5 the patient was tolerating a diet, ambulating, pain had resolved and having BMs.  She was therefore felt stable for discharge home.  She may follow up in our office as needed.     Discharge Instructions     Medication List         amLODipine 10 MG tablet  Commonly known as:  NORVASC  Take 10 mg by mouth daily.     carvedilol 25 MG tablet  Commonly known as:  COREG  Take 25 mg by mouth 2 (two) times daily with a meal.     enalapril 20 MG tablet  Commonly known as:  VASOTEC  Take 20 mg by mouth daily.     furosemide 40 MG tablet  Commonly known as:  LASIX  Take 40 mg by mouth daily.           Follow-up Information   Follow up with [name], [name], MD. (As needed)    Specialty:  General Surgery   Contact information:   [place], [place] [address] [phone]        The results of significant diagnostics from this hospitalization (including imaging, microbiology, ancillary and laboratory) are listed below for reference.    Significant Diagnostic Studies: Ct Abdomen Pelvis Wo Contrast  12/18/2013   CLINICAL DATA:  Mid abdominal and periumbilical pain radiating downward for 3 hr.  EXAM: CT ABDOMEN AND PELVIS WITHOUT CONTRAST  TECHNIQUE: Multidetector CT imaging of the abdomen and pelvis was performed following the standard protocol without intravenous contrast.  COMPARISON:  US ABDOMEN COMPLETE dated 12/18/2013  FINDINGS: The lung bases are clear. No renal, ureteral, or bladder stones are identified. No pyelocaliectasis or ureterectasis.  There is diffuse fatty infiltration throughout the liver with suggestion of focal sparing around the gallbladder fossa. The unenhanced appearance of the spleen, gallbladder, pancreas, adrenal glands, inferior vena cava, and abdominal aorta is unremarkable. Small accessory spleen. Mild prominence of retroperitoneal lymph nodes without pathological enlargement, likely reactive. The stomach is decompressed.  Pelvis: In the left lower quadrant, there is a collection of mildly dilated fluid-filled small bowel with associated mesenteric fluid  and edema. The adjacent proximal and distal small bowel loops are decompressed. Appearance is concerning for closed loop obstruction. No definite wall thickening, pneumatosis, or portal venous gas. There is surgical mesh in the anterior abdominal wall extending from the umbilicus into the pelvis consistent with previous hernia repair. Small amount of free fluid in the pelvis is nonspecific but could be reactive. The uterus and ovaries are not enlarged. The appendix is normal. Colon is decompressed without evidence of  diverticulitis. No significant pelvic lymphadenopathy. Spondylolysis with mild spondylolisthesis at L4-5. Degenerative changes in the lower lumbar spine. No destructive bone lesions appreciated.  IMPRESSION: Focal mildly distended fluid-filled small bowel in the left lower quadrant with associated mesenteric fluid and edema. Findings are concerning for closed loop obstruction. Diffuse fatty infiltration of the liver. No renal or ureteral stone or obstruction.   Electronically Signed   By: Lucienne Capers M.D.   On: 12/18/2013 23:51   Dg Chest 2 View  12/18/2013   CLINICAL DATA:  Abdominal pain  EXAM: CHEST - 2 VIEW  COMPARISON:  None.  FINDINGS: The heart is mildly enlarged. Clear lungs. Normal vascularity. No pleural effusion and no pneumothorax.  IMPRESSION: Cardiomegaly without decompensation.   Electronically Signed   By: Maryclare Bean M.D.   On: 12/18/2013 21:20   US Abdomen Complete  12/18/2013   CLINICAL DATA:  Abdominal pain and vomiting  EXAM: ULTRASOUND ABDOMEN COMPLETE  COMPARISON:  None.  FINDINGS: Gallbladder:  No gallstones or wall thickening visualized. There is no pericholecystic fluid. No sonographic Murphy sign noted.  Common bile duct:  Diameter: 6 mm. There is no intrahepatic, common hepatic, or common bile duct dilatation.  Liver:  Liver shows diffuse increased echogenicity. Near the gallbladder fossa, there is a relative hypoechoic area measuring 2.3 x 2.2 x 1.3 cm.  IVC:  No abnormality visualized.  Pancreas:  Visualized portion unremarkable. Portions of the tail of the pancreas are obscured by gas.  Spleen:  Size and appearance within normal limits.  Right Kidney:  Length: 10.7 cm. Echogenicity within normal limits. No mass or hydronephrosis visualized.  Left Kidney:  Length: 12.1 cm. Echogenicity within normal limits. No mass or hydronephrosis visualized.  Abdominal aorta:  No aneurysm visualized.  Other findings:  No demonstrable ascites.  IMPRESSION: Liver shows diffuse increased  echogenicity consistent with fatty change. There is a relative hypoechoic area near the gallbladder fossa which may represent fatty sparing. A focal mass in this area, however, cannot be excluded. Given this circumstance, would advise nonemergent CT are MRI pre and post-contrast to further evaluate. MR would be the abdomen study to evaluate this lesion if patient is able.  Portions the pancreatic tail are obscured by gas. Visualized portions of the pancreas appear normal.  Study is otherwise unremarkable.   Electronically Signed   By: Lowella Grip M.D.   On: 12/18/2013 21:58   Dg Abd 2 Views  12/21/2013   CLINICAL DATA:  sbo  EXAM: ABDOMEN - 2 VIEW  COMPARISON:  CT ABD/PELV WO CM dated 12/18/2013  FINDINGS: An NG tube is appreciated coursing along the expected region of the stomach. Compared to prior CT focal dilated loops of small bowel appreciated again within the left upper quadrant. There is otherwise a paucity of bowel gas. Degenerative changes identified within the lower lumbar spine.  IMPRESSION: Persistent focal area of dilated loops of small bowel left upper quadrant. Stable when compared prior CT.   Electronically Signed   By: Margaree Mackintosh M.D.  On: 12/21/2013 08:30    Microbiology: No results found for this or any previous visit (from the past 240 hour(s)).   Labs: Basic Metabolic Panel:  Recent Labs Lab 12/18/13 1903 12/19/13 0600 12/20/13 0920 12/21/13 0415  NA 140 137 138 138  K 4.5 4.4 5.1 4.4  CL 104 102 101 100  CO2 22 22 23 23   GLUCOSE 145* 142* 125* 139*  BUN 10 10 8 6   CREATININE 0.65 0.64 0.74 0.73  CALCIUM 9.4 8.6 9.1 8.9   Liver Function Tests:  Recent Labs Lab 12/18/13 1903  AST 27  ALT 26  ALKPHOS 122*  BILITOT 0.4  PROT 8.0  ALBUMIN 3.8    Recent Labs Lab 12/18/13 2031  LIPASE 19   No results found for this basename: AMMONIA,  in the last 168 hours CBC:  Recent Labs Lab 12/18/13 1903 12/19/13 0600 12/20/13 0920 12/21/13 0415  WBC  10.9* 10.6* 11.6* 10.2  NEUTROABS 8.5*  --   --   --   HGB 12.0 10.7* 11.4* 10.6*  HCT 37.5 34.6* 36.5 34.0*  MCV 77.3* 79.0 79.0 78.7  PLT 398 323 364 328   Cardiac Enzymes: No results found for this basename: CKTOTAL, CKMB, CKMBINDEX, TROPONINI,  in the last 168 hours BNP: BNP (last 3 results) No results found for this basename: PROBNP,  in the last 8760 hours CBG: No results found for this basename: GLUCAP,  in the last 168 hours  Active Problems:   Bowel obstruction   Time coordinating discharge: <30 mins  Signed:  Alynah Schone, ANP-BC

## 2013-12-23 NOTE — Progress Notes (Signed)
Patient ID: Stacy Martinez, female   DOB: December 14, 1955, 58 y.o.   MRN: [id number]   Subjective: Had 2 BMs yesterday.  Tolerated clears.  No n/v.    Objective:  Vital signs:  Filed Vitals:   12/22/13 0542 12/22/13 1411 12/22/13 2143 12/23/13 0535  BP: 156/77 125/59 132/81 117/59  Pulse: 77 74 74 77  Temp: 97.5 F (36.4 C) 97.4 F (36.3 C) 98.5 F (36.9 C) 98.4 F (36.9 C)  TempSrc: Oral Oral Oral Oral  Resp: 17 16 20 20   Height:      Weight:      SpO2: 100% 100% 100% 98%    Last BM Date: 12/22/13  Intake/Output   Yesterday:  04/23 0701 - 04/24 0700 In: 120 [P.O.:120] Out: -  This shift:    I/O last 3 completed shifts: In: 1563.3 [P.O.:240; I.V.:1323.3] Out: 0     Physical Exam: General: Pt awake/alert/oriented x4 in acute distress Chest: cta No chest wall pain w good excursion CV:  Pulses intact.  Regular rhythm MS: Normal AROM mjr joints.  No obvious deformity Abdomen: Soft.  Nondistended.  Non tender. No evidence of peritonitis.  No incarcerated hernias. Ext:  SCDs BLE.  No mjr edema.  No cyanosis Skin: No petechiae / purpura   Problem List:   Active Problems:   Bowel obstruction    Results:   Labs: No results found for this or any previous visit (from the past 48 hour(s)).  Imaging / Studies: No results found.  Medications / Allergies: per chart  Antibiotics: Anti-infectives   None      Assessment  1. SBO, improving  2. HTN   Plan 1. Advance to fulls, if able to tolerate discharge home after lunch.  Erby Pian, [facility] Pager [phone] Office [phone]  12/23/2013 8:59 AM

## 2013-12-23 NOTE — Discharge Instructions (Signed)
Intestinal Obstruction Care After Please read the instructions outlined below. Refer to these instructions for the next few weeks. These discharge instructions provide you with general information on caring for yourself after surgery. Your caregiver may also give you specific instructions. While your treatment has been planned according to the most current medical practices available, unavoidable complications occasionally occur. If you have any problems or questions after discharge, please call your caregiver. HOME CARE INSTRUCTIONS   It is normal to be sore for a couple weeks following surgery. See your caregiver if this seems to be getting worse rather than better.  Take prescribed medicine as directed. Only take over-the-counter or prescription medicines for pain, discomfort, and or fever, as directed by your caregiver.  Use showers for bathing, until seen or as instructed.  Change dressings if necessary or as directed.  You may resume normal diet and activities as directed or allowed.  Liquids and soft foods may be easier to digest at first. Once you can tolerate liquid and soft foods, you can begin eating regular solid foods.  Avoid lifting or driving until you are instructed otherwise.  Make an appointment to see your caregiver for suture (stitches) or staple removal when instructed.  You may use ice on your incision for 15-20 minutes, 03-04 times per day for the first two days. SEEK MEDICAL CARE IF:   You see redness, swelling, or have increasing pain in wounds.  You have pus coming from your wound.  You develop an unexplained temperature over 102 F (38.9 C).  You have a bad smell coming from the wound or dressing.  You develop lightheadedness or feel faint. SEEK IMMEDIATE MEDICAL CARE IF:   You have increased bleeding from wounds.  You have increasing abdominal pain, repeated vomiting, dehydration or fainting.  You develop severe weakness, chest pain or back  pain  You develop blood in your vomit, stool or you have tarry stool.  You develop a rash.  You have difficulty breathing.  You develop any reaction or side effects to medicines given. Document Released: 03/07/2005 Document Revised: 11/10/2011 Document Reviewed: 09/21/2007 [facility] Patient Information 2014 [place], [place].

## 2013-12-23 NOTE — Progress Notes (Signed)
I have seen and examined the patient and agree with the assessment and plans. SBO improved.  Hopefully home later today  Kingstin Heims A. Ninfa Linden  MD, FACS

## 2013-12-23 NOTE — Progress Notes (Signed)
Discharge instructions reviewed with patient, questions answered, verbalized understanding. No written prescriptions given.  Patient transported to main entrance of hospital via wheelchair to be taken home by family member.  Patient in good condition at time of discharge from [facility].

## 2014-01-02 ENCOUNTER — Emergency Department (HOSPITAL_COMMUNITY): Payer: Self-pay

## 2014-01-02 ENCOUNTER — Encounter (HOSPITAL_COMMUNITY): Payer: Self-pay | Admitting: Emergency Medicine

## 2014-01-02 ENCOUNTER — Emergency Department (HOSPITAL_COMMUNITY)
Admission: EM | Admit: 2014-01-02 | Discharge: 2014-01-02 | Disposition: A | Discharge status: Home / Self Care | Payer: Self-pay | Attending: Emergency Medicine | Admitting: Emergency Medicine

## 2014-01-02 DIAGNOSIS — I10 Essential (primary) hypertension: Secondary | ICD-10-CM | POA: Insufficient documentation

## 2014-01-02 DIAGNOSIS — R112 Nausea with vomiting, unspecified: Secondary | ICD-10-CM | POA: Insufficient documentation

## 2014-01-02 DIAGNOSIS — R197 Diarrhea, unspecified: Secondary | ICD-10-CM | POA: Insufficient documentation

## 2014-01-02 DIAGNOSIS — R1084 Generalized abdominal pain: Secondary | ICD-10-CM | POA: Insufficient documentation

## 2014-01-02 DIAGNOSIS — Z79899 Other long term (current) drug therapy: Secondary | ICD-10-CM | POA: Insufficient documentation

## 2014-01-02 DIAGNOSIS — R1110 Vomiting, unspecified: Secondary | ICD-10-CM

## 2014-01-02 DIAGNOSIS — R109 Unspecified abdominal pain: Secondary | ICD-10-CM

## 2014-01-02 LAB — COMPREHENSIVE METABOLIC PANEL
ALT: 33 U/L (ref 0–35)
AST: 37 U/L (ref 0–37)
Albumin: 3.9 g/dL (ref 3.5–5.2)
Alkaline Phosphatase: 97 U/L (ref 39–117)
BILIRUBIN TOTAL: 0.7 mg/dL (ref 0.3–1.2)
BUN: 19 mg/dL (ref 6–23)
CHLORIDE: 101 meq/L (ref 96–112)
CO2: 24 meq/L (ref 19–32)
Calcium: 9.2 mg/dL (ref 8.4–10.5)
Creatinine, Ser: 0.8 mg/dL (ref 0.50–1.10)
GFR, EST NON AFRICAN AMERICAN: 80 mL/min — AB (ref >90)
Glucose, Bld: 127 mg/dL — ABNORMAL HIGH (ref 70–99)
POTASSIUM: 4 meq/L (ref 3.7–5.3)
SODIUM: 143 meq/L (ref 137–147)
Total Protein: 8.2 g/dL (ref 6.0–8.3)

## 2014-01-02 LAB — CBC WITH DIFFERENTIAL/PLATELET
Basophils Absolute: 0 10*3/uL (ref 0.0–0.1)
Basophils Relative: 0 % (ref 0–1)
EOS ABS: 0.1 10*3/uL (ref 0.0–0.7)
Eosinophils Relative: 1 % (ref 0–5)
HCT: 37.8 % (ref 36.0–46.0)
HEMOGLOBIN: 12.1 g/dL (ref 12.0–15.0)
LYMPHS PCT: 27 % (ref 12–46)
Lymphs Abs: 2.9 10*3/uL (ref 0.7–4.0)
MCH: 24.9 pg — ABNORMAL LOW (ref 26.0–34.0)
MCHC: 32 g/dL (ref 30.0–36.0)
MCV: 77.8 fL — ABNORMAL LOW (ref 78.0–100.0)
Monocytes Absolute: 0.9 10*3/uL (ref 0.1–1.0)
Monocytes Relative: 9 % (ref 3–12)
NEUTROS PCT: 63 % (ref 43–77)
Neutro Abs: 6.9 10*3/uL (ref 1.7–7.7)
PLATELETS: 522 10*3/uL — AB (ref 150–400)
RBC: 4.86 MIL/uL (ref 3.87–5.11)
RDW: 15.9 % — ABNORMAL HIGH (ref 11.5–15.5)
WBC: 10.8 10*3/uL — ABNORMAL HIGH (ref 4.0–10.5)

## 2014-01-02 LAB — I-STAT CG4 LACTIC ACID, ED: LACTIC ACID, VENOUS: 1.72 mmol/L (ref 0.5–2.2)

## 2014-01-02 MED ORDER — ONDANSETRON HCL 4 MG/2ML IJ SOLN
4.0000 mg | Freq: Once | INTRAMUSCULAR | Status: AC
  Administered 2014-01-02: 4 mg via INTRAVENOUS
  Filled 2014-01-02: qty 2

## 2014-01-02 MED ORDER — SODIUM CHLORIDE 0.9 % IV BOLUS (SEPSIS)
500.0000 mL | Freq: Once | INTRAVENOUS | Status: AC
  Administered 2014-01-02: 500 mL via INTRAVENOUS

## 2014-01-02 MED ORDER — MORPHINE SULFATE 4 MG/ML IJ SOLN
4.0000 mg | Freq: Once | INTRAMUSCULAR | Status: AC
  Administered 2014-01-02: 4 mg via INTRAVENOUS
  Filled 2014-01-02: qty 1

## 2014-01-02 NOTE — ED Provider Notes (Signed)
CSN: [id number]     Arrival date & time 01/02/14  1553 History   First MD Initiated Contact with Patient 01/02/14 1652     Chief Complaint  Patient presents with  . Abdominal Pain      Patient is a 58 y.o. female presenting with abdominal pain. The history is provided by the patient.  Abdominal Pain Pain location:  Generalized Pain quality: aching   Pain radiates to:  Does not radiate Pain severity:  Moderate Onset quality:  Gradual Duration:  2 days Timing:  Constant Progression:  Worsening Chronicity:  Recurrent Relieved by:  Nothing Worsened by:  Palpation Associated symptoms: diarrhea, nausea and vomiting   Associated symptoms: no chest pain    PT reports onset of abd pain about 2 days ago.  She reports it started after eating She reports nonbloody vomitng/diarrhea No cp/sob No dysuria  She reports similar to prior bowel obstructions with recent admission Past Medical History  Diagnosis Date  . Enlarged heart   . Hypertension    Past Surgical History  Procedure Laterality Date  . Colon surgery     History reviewed. No pertinent family history. History  Substance Use Topics  . Smoking status: Never Smoker   . Smokeless tobacco: Not on file  . Alcohol Use: No   OB History   Grav Para Term Preterm Abortions TAB SAB Ect Mult Living                 Review of Systems  Cardiovascular: Negative for chest pain.  Gastrointestinal: Positive for nausea, vomiting, abdominal pain and diarrhea.  All other systems reviewed and are negative.     Allergies  Review of patient's allergies indicates no known allergies.  Home Medications   Prior to Admission medications   Medication Sig Start Date End Date Taking? Authorizing Provider  amLODipine (NORVASC) 10 MG tablet Take 10 mg by mouth daily.   Yes Historical Provider, MD  carvedilol (COREG) 25 MG tablet Take 25 mg by mouth 2 (two) times daily with a meal.   Yes Historical Provider, MD  enalapril (VASOTEC) 20 MG  tablet Take 20 mg by mouth daily.   Yes Historical Provider, MD  furosemide (LASIX) 40 MG tablet Take 40 mg by mouth daily.   Yes Historical Provider, MD   BP 149/71  Pulse 72  Temp(Src) 98 F (36.7 C) (Oral)  Resp 13  SpO2 98% Physical Exam CONSTITUTIONAL: Well developed/well nourished HEAD: Normocephalic/atraumatic EYES: EOMI/PERRL ENMT: Mucous membranes moist NECK: supple no meningeal signs SPINE:entire spine nontender CV: S1/S2 noted, no murmurs/rubs/gallops noted LUNGS: Lungs are clear to auscultation bilaterally, no apparent distress ABDOMEN: soft, obese, mild diffuse tenderness noted, no hernia palpation, +BS noted, no rebound or guarding GU:no cva tenderness NEURO: Pt is awake/alert, moves all extremitiesx4 EXTREMITIES: pulses normal, full ROM SKIN: warm, color normal PSYCH: no abnormalities of mood noted  ED Course  Procedures 7:13 PM Pt with recent admission for SBO, now with recurrent pain Will d/w surgery 8:16 PM Pt reports she is improved Abdominal xray is similar to prior No further pain or vomiting D/w dr Marden Noble blackman surgery We discussed case/imaging/labs If improved she can call for f/u tomorrow with surgery 8:56 PM Pt tolerated PO She is well appearing and denies pain Suspicion for worsening obstruction is low Advised need for surgical followup We discussed strict return precautions  Labs Review Labs Reviewed  CBC WITH DIFFERENTIAL - Abnormal; Notable for the following:    WBC 10.8 (*)  MCV 77.8 (*)    MCH 24.9 (*)    RDW 15.9 (*)    Platelets 522 (*)    All other components within normal limits  COMPREHENSIVE METABOLIC PANEL - Abnormal; Notable for the following:    Glucose, Bld 127 (*)    GFR calc non Af Amer 80 (*)    All other components within normal limits  I-STAT CG4 LACTIC ACID, ED    Imaging Review Dg Abd Acute W/chest  01/02/2014   CLINICAL DATA:  Nausea vomiting  EXAM: ACUTE ABDOMEN SERIES (ABDOMEN 2 VIEW & CHEST 1 VIEW)   COMPARISON:  12/21/2013  FINDINGS: The lungs are clear without focal consolidation, edema, effusion or pneumothorax. Cardio pericardial silhouette is within normal limits for size. Imaged bony structures of the thorax are intact.  Upright film shows no evidence for intraperitoneal free air. Supine abdomen shows dilated small bowel loops measuring up to 5 cm in diameter similar to, but not quite as advanced as seen on the previous study.  IMPRESSION: No acute cardiopulmonary findings.  No intraperitoneal free air. Gaseous small bowel dilatation in the left abdomen, similar to before, compatible with evolving small bowel obstruction.   Electronically Signed   By: Misty Stanley M.D.   On: 01/02/2014 18:39     EKG Interpretation   Date/Time:  Monday Jan 02 2014 16:38:27 EDT Ventricular Rate:  89 PR Interval:  148 QRS Duration: 153 QT Interval:  420 QTC Calculation: 511 R Axis:   -36 Text Interpretation:  Sinus rhythm Left bundle branch block Baseline  wander in lead(s) V2 Confirmed by Christy Gentles  MD, Elenore Rota ([id number]) on 01/02/2014  4:45:09 PM      MDM   Final diagnoses:  Abdominal pain  Vomiting and diarrhea    Nursing notes including past medical history and social history reviewed and considered in documentation Labs/vital reviewed and considered Previous records reviewed and considered     Sharyon Cable, MD 01/02/14 2057

## 2014-01-02 NOTE — ED Notes (Addendum)
Per pt sts she is having generalized abdominal pain since Saturday. sts she was here recently and told she had a "kink" in her bowel. sts N,V,D. Looking at pt recent chart pt recently admitted for bowel obstruction and sts it feels the same as last time or worse.

## 2014-01-02 NOTE — ED Notes (Signed)
Patient requested and received a cup of ice water.

## 2014-01-02 NOTE — ED Notes (Signed)
NOTIFIED DR. WICKLINE OF PATIENTS LAB RESULTS OF CG4+ LACTIC ACID ,01/02/2014. 

## 2014-01-02 NOTE — Discharge Instructions (Signed)

## 2014-01-02 NOTE — ED Notes (Signed)
Pt reports 2 weeks ago she was here for the same thing, c/o abd pain, was admitting for a "kink in her bowel", stayed for about a week. sts she pain had gone away by the time she was discharged and able to keep food down. sts she had been eating regular diet at home like normal and no pain then on Saturday she ate some grapes and started having abd pain. LBM yesterday, liquid like stool, dark brown color. Also having nausea and vomiting, vomiting hours after eating. Tried taking pain medicine but has thrown it back up. Doesn't have any nausea medicine at home to take. Nad, skin warm and dry, resp e/u.

## 2015-04-18 DIAGNOSIS — I10 Essential (primary) hypertension: Secondary | ICD-10-CM | POA: Insufficient documentation

## 2015-04-18 DIAGNOSIS — E119 Type 2 diabetes mellitus without complications: Secondary | ICD-10-CM | POA: Insufficient documentation

## 2015-04-18 DIAGNOSIS — T148XXA Other injury of unspecified body region, initial encounter: Secondary | ICD-10-CM | POA: Insufficient documentation

## 2015-04-18 DIAGNOSIS — E1142 Type 2 diabetes mellitus with diabetic polyneuropathy: Secondary | ICD-10-CM | POA: Insufficient documentation

## 2015-11-30 ENCOUNTER — Encounter: Payer: Self-pay | Admitting: Family Medicine

## 2015-11-30 ENCOUNTER — Ambulatory Visit (INDEPENDENT_AMBULATORY_CARE_PROVIDER_SITE_OTHER): Payer: BLUE CROSS/BLUE SHIELD | Admitting: Family Medicine

## 2015-11-30 VITALS — BP 136/74 | HR 79 | Temp 98.6°F | Resp 16 | Ht 62.0 in | Wt 263.8 lb

## 2015-11-30 DIAGNOSIS — M1990 Unspecified osteoarthritis, unspecified site: Secondary | ICD-10-CM | POA: Diagnosis not present

## 2015-11-30 DIAGNOSIS — E1142 Type 2 diabetes mellitus with diabetic polyneuropathy: Secondary | ICD-10-CM

## 2015-11-30 DIAGNOSIS — I429 Cardiomyopathy, unspecified: Secondary | ICD-10-CM

## 2015-11-30 DIAGNOSIS — E1169 Type 2 diabetes mellitus with other specified complication: Secondary | ICD-10-CM | POA: Diagnosis not present

## 2015-11-30 DIAGNOSIS — E785 Hyperlipidemia, unspecified: Secondary | ICD-10-CM | POA: Diagnosis not present

## 2015-11-30 DIAGNOSIS — I10 Essential (primary) hypertension: Secondary | ICD-10-CM | POA: Diagnosis not present

## 2015-11-30 DIAGNOSIS — M5136 Other intervertebral disc degeneration, lumbar region: Secondary | ICD-10-CM | POA: Diagnosis not present

## 2015-11-30 DIAGNOSIS — Z7689 Persons encountering health services in other specified circumstances: Secondary | ICD-10-CM

## 2015-11-30 DIAGNOSIS — Z7189 Other specified counseling: Secondary | ICD-10-CM

## 2015-11-30 DIAGNOSIS — B373 Candidiasis of vulva and vagina: Secondary | ICD-10-CM | POA: Diagnosis not present

## 2015-11-30 DIAGNOSIS — B3731 Acute candidiasis of vulva and vagina: Secondary | ICD-10-CM

## 2015-11-30 LAB — POCT GLYCOSYLATED HEMOGLOBIN (HGB A1C): HEMOGLOBIN A1C: 9.7

## 2015-11-30 MED ORDER — GLUCOSE BLOOD VI STRP: ORAL_STRIP | Status: DC

## 2015-11-30 MED ORDER — METFORMIN HCL 1000 MG PO TABS: 1000.0000 mg | ORAL_TABLET | Freq: Two times a day (BID) | ORAL | Status: DC

## 2015-11-30 MED ORDER — RELION LANCETS ULTRA-THIN 30G MISC: 1.0000 | Freq: Every day | Status: DC

## 2015-11-30 MED ORDER — FLUCONAZOLE 150 MG PO TABS: 150.0000 mg | ORAL_TABLET | Freq: Once | ORAL | Status: DC

## 2015-11-30 NOTE — Patient Instructions (Addendum)
I am not changing any of your blood pressure medications today. Keep taking as directed. We will have you follow-up with Dr. Nehemiah Massed  Diabetes: Increase metformin to 1000mg  twice daily- can take 2 tab twice daily of current tablets until bottle is empty. We will plan on adding Invokana when we have your labwork   Please seek immediate medical attention at ER or Urgent Care if you develop: Chest pain, pressure or tightness. Shortness of breath accompanied by nausea or diaphoresis Visual changes Numbness or tingling on one side of the body Facial droop Altered mental status Or any concerning symptoms.  Canagliflozin oral tablets What is this medicine? CANAGLIFLOZIN (KAN a gli FLOE zin) helps to treat type 2 diabetes. It helps to control blood sugar. Treatment is combined with diet and exercise. This medicine may be used for other purposes; ask your health care provider or pharmacist if you have questions. What should I tell my health care provider before I take this medicine? They need to know if you have any of these conditions: -dehydration -diabetic ketoacidosis -diet low in salt -eating less due to illness, surgery, dieting, or any other reason -having surgery -high cholesterol -high levels of potassium in the blood -history of pancreatitis or pancreas problems -history of yeast infection of the penis or vagina -if you often drink alcohol -infections in the bladder, kidneys, or urinary tract -kidney disease -liver disease -low blood pressure -on hemodialysis -problems urinating -type 1 diabetes -uncircumcised female -an unusual or allergic reaction to canagliflozin, other medicines, foods, dyes, or preservatives -pregnant or trying to get pregnant -breast-feeding How should I use this medicine? Take this medicine by mouth with a glass of water. Follow the directions on the prescription label. Take it before the first meal of the day. Take your dose at the same time each day.  Do not take more often than directed. Do not stop taking except on your doctor's advice. A special MedGuide will be given to you by the pharmacist with each prescription and refill. Be sure to read this information carefully each time. Talk to your pediatrician regarding the use of this medicine in children. Special care may be needed. Overdosage: If you think you have taken too much of this medicine contact a poison control center or emergency room at once. NOTE: This medicine is only for you. Do not share this medicine with others. What if I miss a dose? If you miss a dose, take it as soon as you can. If it is almost time for your next dose, take only that dose. Do not take double or extra doses. What may interact with this medicine? Do not take this medicine with any of the following medications: -gatifloxacinThis medicine may also interact with the following medications: -alcohol -certain medicines for blood pressure, heart disease -digoxin -diuretics -insulin -nateglinide -phenobarbital -phenytoin -repaglinide -rifampin -ritonavir -sulfonylureas like glimepiride, glipizide, glyburide This list may not describe all possible interactions. Give your health care provider a list of all the medicines, herbs, non-prescription drugs, or dietary supplements you use. Also tell them if you smoke, drink alcohol, or use illegal drugs. Some items may interact with your medicine. What should I watch for while using this medicine? Visit your doctor or health care professional for regular checks on your progress. This medicine can cause a serious condition in which there is too much acid in the blood. If you develop nausea, vomiting, stomach pain, unusual tiredness, or breathing problems, stop taking this medicine and call your doctor right  away. If possible, use a ketone dipstick to check for ketones in your urine. A test called the HbA1C (A1C) will be monitored. This is a simple blood test. It  measures your blood sugar control over the last 2 to 3 months. You will receive this test every 3 to 6 months. Learn how to check your blood sugar. Learn the symptoms of low and high blood sugar and how to manage them. Always carry a quick-source of sugar with you in case you have symptoms of low blood sugar. Examples include hard sugar candy or glucose tablets. Make sure others know that you can choke if you eat or drink when you develop serious symptoms of low blood sugar, such as seizures or unconsciousness. They must get medical help at once. Tell your doctor or health care professional if you have high blood sugar. You might need to change the dose of your medicine. If you are sick or exercising more than usual, you might need to change the dose of your medicine. Do not skip meals. Ask your doctor or health care professional if you should avoid alcohol. Many nonprescription cough and cold products contain sugar or alcohol. These can affect blood sugar. Wear a medical ID bracelet or chain, and carry a card that describes your disease and details of your medicine and dosage times. What side effects may I notice from receiving this medicine? Side effects that you should report to your doctor or health care professional as soon as possible: -allergic reactions like skin rash, itching or hives, swelling of the face, lips, or tongue -breathing problems -chest pain -dizziness -fast or irregular heartbeat -feeling faint or lightheaded, falls -muscle weakness -nausea, vomiting, unusual stomach upset or pain -new pain or tenderness, change in skin color, sores or ulcers, or infection in legs or feet -signs and symptoms of low blood sugar such as feeling anxious, confusion, dizziness, increased hunger, unusually weak or tired, sweating, shakiness, cold, irritable, headache, blurred vision, fast heartbeat, loss of consciousness -signs and symptoms of a urinary tract infection, such as fever, chills, a  burning feeling when urinating, blood in the urine, back pain -trouble passing urine or change in the amount of urine, including an urgent need to urinate more often, in larger amounts, or at night -penile discharge, itching, or pain in men -unusual tiredness -vaginal discharge, itching, or odor in women Side effects that usually do not require medical attention (Report these to your doctor or health care professional if they continue or are bothersome.): -constipation -mild increase in urination -thirsty This list may not describe all possible side effects. Call your doctor for medical advice about side effects. You may report side effects to FDA at 1-800-FDA-1088. Where should I keep my medicine? Keep out of the reach of children. Store at room temperature between 20 and 25 degrees C (68 and 77 degrees F). Throw away any unused medicine after the expiration date. NOTE: This sheet is a summary. It may not cover all possible information. If you have questions about this medicine, talk to your doctor, pharmacist, or health care provider.    2016, [employer]. (2015-01-17 14:33:36)

## 2015-11-30 NOTE — Assessment & Plan Note (Signed)
Previously treated with injections in pain management. Currently treated with ibuprofen daily- pt would like to decrease her NSAID use. Would like to see pain management to discuss injections.

## 2015-11-30 NOTE — Assessment & Plan Note (Signed)
Pt not currently on statin. Will check lipid panel and plan to start statin at next visit. Encouraged heart healthy diet.

## 2015-11-30 NOTE — Assessment & Plan Note (Signed)
Osteoarthritis. Plan to continue as needed NSAIDs only. Reviewed risks with heart disease. Encouraged alternating with tylenol. Encouraged capsaicin cream or blue emu cream for pain. Consider ortho referral for join injections with worsening symptoms.

## 2015-11-30 NOTE — Assessment & Plan Note (Signed)
ECG- no change from previous. LBBB. Refer to cardiology to re-establish care. Continue ACE, Beta blocker, and lasix.  Check CBC and CMET.

## 2015-11-30 NOTE — Progress Notes (Signed)
Subjective:    Patient ID: Stacy Martinez, female    DO: 05/18/1956, 60 y.o.   MRM: [id number]  HPI: Stacy Martinez is a 60 y.o. female presenting on 11/30/2015 for Establish Care   HPI  Pt presents to establish care today. Previous care provider was Dr. Garnette [facility] [place] .  It has been 1 years since Her last PCP visit. Records from previous provider will be requested and reviewed. Current medical problems include:  Diabetes: HgA1c- 9.7% today. Unsure last follow-up.  Metformin 500mg  2 per day. Checking AM BG- 260-380. Numb and tingling in her feet. Great toes mostly. Checks her feet. No changes. No blurred vision. Diagnosed 1 year ago. Meter relion. Unsure last eye exam.  Hypertension: Diagnosed in her pregnancy 16 years ago. Amlodipine 10mg  daily, Coreg 25 BID, Mellaril- 10mg  BID, Lasix 40mg - ordered twice daily she is only taking once. Amlodipine makes her ankles swell. Per her report leg swelling caused BID dosing of lasix. 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 She reports she had chronic changes and heart failure. Was previously seeing Dr. Nehemiah Massed regularly for heart issues- last seen 10 years ago. Was on Coreg for her heart. Previously on lasix for her heart. No recent chest pain or shortness of breath. Some leg swelling. Has had echo but unsure of most recent EF.  Arthritis- Hands and shoulders, knees.Has been checked for RA- negative. Taking ibuprofen daily to help with pain. Tylenol does not work for her.   Chronic Back pain- was getting epidural injections. Previously seeing [facility]- Dr. Primus Bravo. Worked very well. Has disc issues in Lumbar spine- told it was degenerative disc disease- MRI several years ago. Taking ibuprofen for back pain when it flares up. Takes 800mg  1-2 times daily. Taking for a long time.  White cheesy vaginal discharge x 3 weeks. Itchy irritated vulva. Tried monistat OTC- 1 day. Worked  but is came back.    Health maintenance:  Last pap: 2011    Past Medical History  Diagnosis Date  . Enlarged heart   . Hypertension   . Allergy   . Arthritis    Social History   Social History  . Marital Status: Divorced    Spouse Name: N/A  . Number of Children: N/A  . Years of Education: N/A   Occupational History  . Not on file.   Social History Main Topics  . Smoking status: Never Smoker   . Smokeless tobacco: Not on file  . Alcohol Use: No  . Drug Use: No  . Sexual Activity: Not on file   Other Topics Concern  . Not on file   Social History Narrative   Family History  Problem Relation Age of Onset  . Diabetes Mother   . Cancer Father    Current Outpatient Prescriptions on File Prior to Visit  Medication Sig  . amLODipine (NORVASC) 10 MG tablet Take 10 mg by mouth daily.  . carvedilol (COREG) 25 MG tablet Take 25 mg by mouth 2 (two) times daily with a meal.  . enalapril (VASOTEC) 20 MG tablet Take 20 mg by mouth daily.  . furosemide (LASIX) 40 MG tablet Take 40 mg by mouth daily.   No current facility-administered medications on file prior to visit.    Review of Systems  Constitutional: Negative for fever and chills.  HENT: Negative.   Respiratory: Negative for cough, chest tightness and wheezing.   Cardiovascular: Negative for chest pain  and leg swelling.  Gastrointestinal: Negative for nausea, vomiting, abdominal pain, diarrhea and constipation.  Endocrine: Negative.  Negative for cold intolerance, heat intolerance, polydipsia, polyphagia and polyuria.  Genitourinary: Positive for vaginal discharge. Negative for dysuria, vaginal bleeding, difficulty urinating, vaginal pain and pelvic pain.  Musculoskeletal: Negative.   Neurological: Positive for numbness (paresthesias feet). Negative for dizziness and light-headedness.  Psychiatric/Behavioral: Negative.    Per HPI unless specifically indicated above     Objective:    BP 136/74 mmHg  Pulse  79  Temp(Src) 98.6 F (37 C) (Oral)  Resp 16  Ht 5\' 2"  (1.575 m)  Wt 263 lb 12.8 oz (119.659 kg)  BMI 48.24 kg/m2  Wt Readings from Last 3 Encounters:  11/30/15 263 lb 12.8 oz (119.659 kg)  12/19/13 251 lb 13.5 oz (114.236 kg)    Physical Exam  Constitutional: She is oriented to person, place, and time. She appears well-developed and well-nourished.  HENT:  Head: Normocephalic and atraumatic.  Neck: Neck supple.  Cardiovascular: Normal rate, regular rhythm and normal heart sounds.  Exam reveals no gallop and no friction rub.   No murmur heard. Pulmonary/Chest: Effort normal and breath sounds normal. She has no wheezes. She exhibits no tenderness.  Abdominal: Soft. Normal appearance and bowel sounds are normal. She exhibits no distension and no mass. There is no tenderness. There is no rebound and no guarding.  Musculoskeletal: Normal range of motion. She exhibits no edema or tenderness.  Lymphadenopathy:    She has no cervical adenopathy.  Neurological: She is alert and oriented to person, place, and time.  Skin: Skin is warm and dry.   Diabetic Foot Exam - Simple   Simple Foot Form  Diabetic Foot exam was performed with the following findings:  Yes 11/30/2015 11:32 AM  Visual Inspection  No deformities, no ulcerations, no other skin breakdown bilaterally:  Yes  Sensation Testing  See comments:  Yes  Pulse Check  Posterior Tibialis and Dorsalis pulse intact bilaterally:  Yes  Comments  Mild decreased sensation to monofilament bilateral great toes.       Results for orders placed or performed in visit on 11/30/15  POCT HgB A1C  Result Value Ref Range   Hemoglobin A1C 9.7       Assessment & Plan:   Problem List Items Addressed This Visit      Cardiovascular and Mediastinum   BP (high blood pressure)    Controlled with multiple medications. No changes to regimen. Check CMET. Encouraged DASH diet. Reviewed alarm symptoms. Recheck 3 mos.       Cardiomyopathy ([place])     ECG- no change from previous. LBBB. Refer to cardiology to re-establish care. Continue ACE, Beta blocker, and lasix.  Check CBC and CMET.      Relevant Orders   EKG 12-Lead   Ambulatory referral to Cardiology     Nervous and Auditory   DM type 2 with diabetic peripheral neuropathy ([place])    Plan to check labs and start invokana. Reviewed risks, benefits, alarm side  Effects, common side effects.  Maximize metformin dosing to 1000 BID. Encouraged decreasing carbohydrate intake. Increasing exercise.  Foot exam done.  Eye exam ordered. UA micro pending.  ACE for renal protection. Recheck 3 mos.       Relevant Medications   glucose blood (RELION GLUCOSE TEST STRIPS) test strip   ReliOn Ultra Thin Lancets MISC   metFORMIN (GLUCOPHAGE) 1000 MG tablet   Other Relevant Orders   POCT HgB A1C (Completed)  Comprehensive metabolic panel   Urine Microalbumin w/creat. ratio     Musculoskeletal and Integument   DDD (degenerative disc disease), lumbar    Previously treated with injections in pain management. Currently treated with ibuprofen daily- pt would like to decrease her NSAID use. Would like to see pain management to discuss injections.       Relevant Orders   Ambulatory referral to Pain Clinic   Arthritis    Osteoarthritis. Plan to continue as needed NSAIDs only. Reviewed risks with heart disease. Encouraged alternating with tylenol. Encouraged capsaicin cream or blue emu cream for pain. Consider ortho referral for join injections with worsening symptoms.       Relevant Orders   CBC with Differential/Platelet     Other   Hyperlipidemia associated with type 2 diabetes mellitus ([place])    Pt not currently on statin. Will check lipid panel and plan to start statin at next visit. Encouraged heart healthy diet.       Relevant Medications   metFORMIN (GLUCOPHAGE) 1000 MG tablet   Other Relevant Orders   Lipid Profile    Other Visit Diagnoses    Encounter to establish care    -   Primary    Vaginal candida        Treat with diflucan.     Relevant Medications    fluconazole (DIFLUCAN) 150 MG tablet       Meds ordered this encounter  Medications  . DISCONTD: metFORMIN (GLUCOPHAGE) 500 MG tablet    Sig: Take by mouth 2 (two) times daily with a meal.  . glucose blood (RELION GLUCOSE TEST STRIPS) test strip    Sig: Use as instructed    Dispense:  100 each    Refill:  12    Order Specific Question:  Supervising Provider    Answer:  Arlis Porta [phone]  . ReliOn Ultra Thin Lancets MISC    Sig: 1 each by Does not apply route daily.    Dispense:  100 each    Refill:  11    Order Specific Question:  Supervising Provider    Answer:  Arlis Porta [phone]  . metFORMIN (GLUCOPHAGE) 1000 MG tablet    Sig: Take 1 tablet (1,000 mg total) by mouth 2 (two) times daily with a meal.    Dispense:  180 tablet    Refill:  3    Order Specific Question:  Supervising Provider    Answer:  Arlis Porta [phone]  . fluconazole (DIFLUCAN) 150 MG tablet    Sig: Take 1 tablet (150 mg total) by mouth once.    Dispense:  1 tablet    Refill:  1    Order Specific Question:  Supervising Provider    Answer:  Arlis Porta [phone]      Follow up plan: Return in about 3 months (around 02/29/2016) for Diabetes. Marland Kitchen

## 2015-11-30 NOTE — Assessment & Plan Note (Signed)
Controlled with multiple medications. No changes to regimen. Check CMET. Encouraged DASH diet. Reviewed alarm symptoms. Recheck 3 mos.

## 2015-11-30 NOTE — Assessment & Plan Note (Signed)
Plan to check labs and start invokana. Reviewed risks, benefits, alarm side  Effects, common side effects.  Maximize metformin dosing to 1000 BID. Encouraged decreasing carbohydrate intake. Increasing exercise.  Foot exam done.  Eye exam ordered. UA micro pending.  ACE for renal protection. Recheck 3 mos.

## 2015-12-01 LAB — CBC WITH DIFFERENTIAL/PLATELET
BASOS: 1 %
Basophils Absolute: 0.1 10*3/uL (ref 0.0–0.2)
EOS (ABSOLUTE): 0.4 10*3/uL (ref 0.0–0.4)
EOS: 4 %
HEMATOCRIT: 38 % (ref 34.0–46.6)
Hemoglobin: 12.2 g/dL (ref 11.1–15.9)
IMMATURE GRANULOCYTES: 0 %
Immature Grans (Abs): 0 10*3/uL (ref 0.0–0.1)
LYMPHS ABS: 3.1 10*3/uL (ref 0.7–3.1)
Lymphs: 36 %
MCH: 26.7 pg (ref 26.6–33.0)
MCHC: 32.1 g/dL (ref 31.5–35.7)
MCV: 83 fL (ref 79–97)
MONOS ABS: 0.5 10*3/uL (ref 0.1–0.9)
Monocytes: 6 %
NEUTROS PCT: 53 %
Neutrophils Absolute: 4.7 10*3/uL (ref 1.4–7.0)
PLATELETS: 428 10*3/uL — AB (ref 150–379)
RBC: 4.57 x10E6/uL (ref 3.77–5.28)
RDW: 15.1 % (ref 12.3–15.4)
WBC: 8.7 10*3/uL (ref 3.4–10.8)

## 2015-12-01 LAB — COMPREHENSIVE METABOLIC PANEL
A/G RATIO: 1.2 (ref 1.2–2.2)
ALBUMIN: 4.3 g/dL (ref 3.5–5.5)
ALT: 51 IU/L — ABNORMAL HIGH (ref 0–32)
AST: 49 IU/L — ABNORMAL HIGH (ref 0–40)
Alkaline Phosphatase: 119 IU/L — ABNORMAL HIGH (ref 39–117)
BILIRUBIN TOTAL: 0.4 mg/dL (ref 0.0–1.2)
BUN / CREAT RATIO: 24 — AB (ref 9–23)
BUN: 19 mg/dL (ref 6–24)
CALCIUM: 9.5 mg/dL (ref 8.7–10.2)
CHLORIDE: 97 mmol/L (ref 96–106)
CO2: 23 mmol/L (ref 18–29)
Creatinine, Ser: 0.79 mg/dL (ref 0.57–1.00)
GFR, EST AFRICAN AMERICAN: 95 mL/min/{1.73_m2} (ref >59)
GFR, EST NON AFRICAN AMERICAN: 82 mL/min/{1.73_m2} (ref >59)
GLOBULIN, TOTAL: 3.5 g/dL (ref 1.5–4.5)
Glucose: 239 mg/dL — ABNORMAL HIGH (ref 65–99)
POTASSIUM: 4.8 mmol/L (ref 3.5–5.2)
Sodium: 136 mmol/L (ref 134–144)
TOTAL PROTEIN: 7.8 g/dL (ref 6.0–8.5)

## 2015-12-01 LAB — LIPID PANEL
CHOL/HDL RATIO: 3.9 ratio (ref 0.0–4.4)
Cholesterol, Total: 182 mg/dL (ref 100–199)
HDL: 47 mg/dL (ref >39)
LDL Calculated: 105 mg/dL — ABNORMAL HIGH (ref 0–99)
Triglycerides: 149 mg/dL (ref 0–149)
VLDL Cholesterol Cal: 30 mg/dL (ref 5–40)

## 2015-12-01 LAB — MICROALBUMIN / CREATININE URINE RATIO
CREATININE, UR: 39.4 mg/dL
MICROALB/CREAT RATIO: <7.6 mg/g creat (ref 0.0–30.0)

## 2015-12-03 ENCOUNTER — Other Ambulatory Visit: Payer: Self-pay | Admitting: Family Medicine

## 2015-12-03 DIAGNOSIS — E1142 Type 2 diabetes mellitus with diabetic polyneuropathy: Secondary | ICD-10-CM

## 2015-12-03 MED ORDER — CANAGLIFLOZIN 100 MG PO TABS: 100.0000 mg | ORAL_TABLET | Freq: Every day | ORAL | Status: DC

## 2015-12-05 LAB — HEPATITIS PANEL, ACUTE
HEP B C IGM: NEGATIVE
HEP B S AG: NEGATIVE
Hep A IgM: NEGATIVE
Hep C Virus Ab: 0.1 s/co ratio (ref 0.0–0.9)

## 2015-12-05 LAB — GAMMA GT: GGT: 109 IU/L — ABNORMAL HIGH (ref 0–60)

## 2015-12-05 LAB — SPECIMEN STATUS REPORT

## 2015-12-25 ENCOUNTER — Other Ambulatory Visit: Payer: Self-pay | Admitting: Family Medicine

## 2015-12-25 MED ORDER — FUROSEMIDE 40 MG PO TABS: 40.0000 mg | ORAL_TABLET | Freq: Every day | ORAL | Status: DC

## 2015-12-25 MED ORDER — CARVEDILOL 25 MG PO TABS: 25.0000 mg | ORAL_TABLET | Freq: Two times a day (BID) | ORAL | Status: DC

## 2015-12-25 MED ORDER — AMLODIPINE BESYLATE 10 MG PO TABS: 10.0000 mg | ORAL_TABLET | Freq: Every day | ORAL | Status: DC

## 2015-12-25 MED ORDER — ENALAPRIL MALEATE 20 MG PO TABS
20.0000 mg | ORAL_TABLET | Freq: Every day | ORAL | Status: DC
Start: 2015-12-25 — End: 2016-01-10

## 2016-01-08 ENCOUNTER — Encounter: Payer: Self-pay | Admitting: Family Medicine

## 2016-01-10 ENCOUNTER — Other Ambulatory Visit: Payer: Self-pay | Admitting: Family Medicine

## 2016-01-10 MED ORDER — ENALAPRIL MALEATE 20 MG PO TABS: 20.0000 mg | ORAL_TABLET | Freq: Two times a day (BID) | ORAL | Status: DC

## 2016-01-15 ENCOUNTER — Encounter: Payer: Self-pay | Admitting: Pain Medicine

## 2016-01-15 ENCOUNTER — Ambulatory Visit: Payer: BLUE CROSS/BLUE SHIELD | Attending: Pain Medicine | Admitting: Pain Medicine

## 2016-01-15 VITALS — BP 173/72 | HR 84 | Temp 98.1°F | Resp 18 | Ht 62.0 in | Wt 250.0 lb

## 2016-01-15 DIAGNOSIS — M1990 Unspecified osteoarthritis, unspecified site: Secondary | ICD-10-CM | POA: Diagnosis not present

## 2016-01-15 DIAGNOSIS — I10 Essential (primary) hypertension: Secondary | ICD-10-CM | POA: Insufficient documentation

## 2016-01-15 DIAGNOSIS — M179 Osteoarthritis of knee, unspecified: Secondary | ICD-10-CM | POA: Insufficient documentation

## 2016-01-15 DIAGNOSIS — M533 Sacrococcygeal disorders, not elsewhere classified: Secondary | ICD-10-CM

## 2016-01-15 DIAGNOSIS — E1140 Type 2 diabetes mellitus with diabetic neuropathy, unspecified: Secondary | ICD-10-CM | POA: Insufficient documentation

## 2016-01-15 DIAGNOSIS — E0942 Drug or chemical induced diabetes mellitus with neurological complications with diabetic polyneuropathy: Secondary | ICD-10-CM

## 2016-01-15 DIAGNOSIS — M545 Low back pain: Secondary | ICD-10-CM | POA: Diagnosis present

## 2016-01-15 DIAGNOSIS — M47816 Spondylosis without myelopathy or radiculopathy, lumbar region: Secondary | ICD-10-CM

## 2016-01-15 DIAGNOSIS — M5136 Other intervertebral disc degeneration, lumbar region: Secondary | ICD-10-CM | POA: Diagnosis not present

## 2016-01-15 DIAGNOSIS — M170 Bilateral primary osteoarthritis of knee: Secondary | ICD-10-CM

## 2016-01-15 DIAGNOSIS — M79606 Pain in leg, unspecified: Secondary | ICD-10-CM | POA: Diagnosis present

## 2016-01-15 DIAGNOSIS — M1710 Unilateral primary osteoarthritis, unspecified knee: Secondary | ICD-10-CM | POA: Insufficient documentation

## 2016-01-15 NOTE — Patient Instructions (Addendum)
PLAN  Continue present medication  Lumbar facet, medial branch nerve blocks to be performed at time return appointment  F/U PCP A Krebs for evaliation of  BP and general medical  condition  F/U surgical evaluation. May consider pending follow-up evaluations  Ask the nurses and secretary the date of your OPEN lumbar MRI  F/U neurological evaluation. May consider pending follow-up evaluations  May consider radiofrequency rhizolysis or intraspinal procedures pending response to present treatment and F/U evaluation   Patient to call [facility] should patient have concerns prior to scheduled return appointment. Facet Joint Block The facet joints connect the bones of the spine (vertebrae). They make it possible for you to bend, twist, and make other movements with your spine. They also prevent you from overbending, overtwisting, and making other excessive movements.  A facet joint block is a procedure where a numbing medicine (anesthetic) is injected into a facet joint. Often, a type of anti-inflammatory medicine called a steroid is also injected. A facet joint block may be done for two reasons:   Diagnosis. A facet joint block may be done as a test to see whether neck or back pain is caused by a worn-down or infected facet joint. If the pain gets better after a facet joint block, it means the pain is probably coming from the facet joint. If the pain does not get better, it means the pain is probably not coming from the facet joint.   Therapy. A facet joint block may be done to relieve neck or back pain caused by a facet joint. A facet joint block is only done as a therapy if the pain does not improve with medicine, exercise programs, physical therapy, and other forms of pain management. LET [facility] CARE PROVIDER KNOW ABOUT:   Any allergies you have.   All medicines you are taking, including vitamins, herbs, eyedrops, and over-the-counter medicines and creams.   Previous  problems you or members of your family have had with the use of anesthetics.   Any blood disorders you have had.   Other health problems you have. RISKS AND COMPLICATIONS Generally, having a facet joint block is safe. However, as with any procedure, complications can occur. Possible complications associated with having a facet joint block include:   Bleeding.   Injury to a nerve near the injection site.   Pain at the injection site.   Weakness or numbness in areas controlled by nerves near the injection site.   Infection.   Temporary fluid retention.   Allergic reaction to anesthetics or medicines used during the procedure. BEFORE THE PROCEDURE   Follow your health care provider's instructions if you are taking dietary supplements or medicines. You may need to stop taking them or reduce your dosage.   Do not take any new dietary supplements or medicines without asking your health care provider first.   Follow your health care provider's instructions about eating and drinking before the procedure. You may need to stop eating and drinking several hours before the procedure.   Arrange to have an adult drive you home after the procedure. PROCEDURE  You may need to remove your clothing and dress in an open-back gown so that your health care provider can access your spine.   The procedure will be done while you are lying on an X-ray table. Most of the time you will be asked to lie on your stomach, but you may be asked to lie in a different position if an injection will  be made in your neck.   Special machines will be used to monitor your oxygen levels, heart rate, and blood pressure.   If an injection will be made in your neck, an intravenous (IV) tube will be inserted into one of your veins. Fluids and medicine will flow directly into your body through the IV tube.   The area over the facet joint where the injection will be made will be cleaned with an antiseptic  soap. The surrounding skin will be covered with sterile drapes.   An anesthetic will be applied to your skin to make the injection area numb. You may feel a temporary stinging or burning sensation.   A video X-ray machine will be used to locate the joint. A contrast dye may be injected into the facet joint area to help with locating the joint.   When the joint is located, an anesthetic medicine will be injected into the joint through the needle.   Your health care provider will ask you whether you feel pain relief. If you do feel relief, a steroid may be injected to provide pain relief for a longer period of time. If you do not feel relief or feel only partial relief, additional injections of an anesthetic may be made in other facet joints.   The needle will be removed, the skin will be cleansed, and bandages will be applied.  AFTER THE PROCEDURE   You will be observed for 15-30 minutes before being allowed to go home. Do not drive. Have an adult drive you or take a taxi or public transportation instead.   If you feel pain relief, the pain will return in several hours or days when the anesthetic wears off.   You may feel pain relief 2-14 days after the procedure. The amount of time this relief lasts varies from person to person.   It is normal to feel some tenderness over the injected area(s) for 2 days following the procedure.   If you have diabetes, you may have a temporary increase in blood sugar.   This information is not intended to replace advice given to you by your health care provider. Make sure you discuss any questions you have with your health care provider.   Document Released: 01/07/2007 Document Revised: 09/08/2014 Document Reviewed: 06/07/2012 Elsevier Interactive Patient Education 2016 [place]  What are the risk, side effects and possible complications? Generally speaking, most procedures are safe.  However, with any  procedure there are risks, side effects, and the possibility of complications.  The risks and complications are dependent upon the sites that are lesioned, or the type of nerve block to be performed.  The closer the procedure is to the spine, the more serious the risks are.  Great care is taken when placing the radio frequency needles, block needles or lesioning probes, but sometimes complications can occur.  Infection: Any time there is an injection through the skin, there is a risk of infection.  This is why sterile conditions are used for these blocks.  There are four possible types of infection.  Localized skin infection.  Central Nervous System Infection-This can be in the form of Meningitis, which can be deadly.  Epidural Infections-This can be in the form of an epidural abscess, which can cause pressure inside of the spine, causing compression of the spinal cord with subsequent paralysis. This would require an emergency surgery to decompress, and there are no guarantees that the patient would recover from the paralysis.  Discitis-This is an infection of the intervertebral discs.  It occurs in about 1% of discography procedures.  It is difficult to treat and it may lead to surgery.        2. Pain: the needles have to go through skin and soft tissues, will cause soreness.       3. Damage to internal structures:  The nerves to be lesioned may be near blood vessels or    other nerves which can be potentially damaged.       4. Bleeding: Bleeding is more common if the patient is taking blood thinners such as  aspirin, Coumadin, Ticiid, Plavix, etc., or if he/she have some genetic predisposition  such as hemophilia. Bleeding into the spinal canal can cause compression of the spinal  cord with subsequent paralysis.  This would require an emergency surgery to  decompress and there are no guarantees that the patient would recover from the  paralysis.       5. Pneumothorax:  Puncturing of a lung is a  possibility, every time a needle is introduced in  the area of the chest or upper back.  Pneumothorax refers to free air around the  collapsed lung(s), inside of the thoracic cavity (chest cavity).  Another two possible  complications related to a similar event would include: Hemothorax and Chylothorax.   These are variations of the Pneumothorax, where instead of air around the collapsed  lung(s), you may have blood or chyle, respectively.       6. Spinal headaches: They may occur with any procedures in the area of the spine.       7. Persistent CSF (Cerebro-Spinal Fluid) leakage: This is a rare problem, but may occur  with prolonged intrathecal or epidural catheters either due to the formation of a fistulous  track or a dural tear.       8. Nerve damage: By working so close to the spinal cord, there is always a possibility of  nerve damage, which could be as serious as a permanent spinal cord injury with  paralysis.       9. Death:  Although rare, severe deadly allergic reactions known as "Anaphylactic  reaction" can occur to any of the medications used.      10. Worsening of the symptoms:  We can always make thing worse.  What are the chances of something like this happening? Chances of any of this occuring are extremely low.  By statistics, you have more of a chance of getting killed in a motor vehicle accident: while driving to the hospital than any of the above occurring .  Nevertheless, you should be aware that they are possibilities.  In general, it is similar to taking a shower.  Everybody knows that you can slip, hit your head and get killed.  Does that mean that you should not shower again?  Nevertheless always keep in mind that statistics do not mean anything if you happen to be on the wrong side of them.  Even if a procedure has a 1 (one) in a 1,000,000 (million) chance of going wrong, it you happen to be that one..Also, keep in mind that by statistics, you have more of a chance of having  something go wrong when taking medications.  Who should not have this procedure? If you are on a blood thinning medication (e.g. Coumadin, Plavix, see list of "Blood Thinners"), or if you have an active infection going on, you should not have the procedure.  If you  are taking any blood thinners, please inform your physician.  How should I prepare for this procedure?  Do not eat or drink anything at least six hours prior to the procedure.  Bring a driver with you .  It cannot be a taxi.  Come accompanied by an adult that can drive you back, and that is strong enough to help you if your legs get weak or numb from the local anesthetic.  Take all of your medicines the morning of the procedure with just enough water to swallow them.  If you have diabetes, make sure that you are scheduled to have your procedure done first thing in the morning, whenever possible.  If you have diabetes, take only half of your insulin dose and notify our nurse that you have done so as soon as you arrive at the clinic.  If you are diabetic, but only take blood sugar pills (oral hypoglycemic), then do not take them on the morning of your procedure.  You may take them after you have had the procedure.  Do not take aspirin or any aspirin-containing medications, at least eleven (11) days prior to the procedure.  They may prolong bleeding.  Wear loose fitting clothing that may be easy to take off and that you would not mind if it got stained with Betadine or blood.  Do not wear any jewelry or perfume  Remove any nail coloring.  It will interfere with some of our monitoring equipment.  NOTE: Remember that this is not meant to be interpreted as a complete list of all possible complications.  Unforeseen problems may occur.  BLOOD THINNERS The following drugs contain aspirin or other products, which can cause increased bleeding during surgery and should not be taken for 2 weeks prior to and 1 week after surgery.  If you  should need take something for relief of minor pain, you may take acetaminophen which is found in Tylenol,m Datril, Anacin-3 and Panadol. It is not blood thinner. The products listed below are.  Do not take any of the products listed below in addition to any listed on your instruction sheet.  A.P.C or A.P.C with Codeine Codeine Phosphate Capsules #3 Ibuprofen Ridaura  ABC compound Congesprin Imuran rimadil  Advil Cope Indocin Robaxisal  Alka-Seltzer Effervescent Pain Reliever and Antacid Coricidin or Coricidin-D  Indomethacin Rufen  Alka-Seltzer plus Cold Medicine Cosprin Ketoprofen S-A-C Tablets  Anacin Analgesic Tablets or Capsules Coumadin Korlgesic Salflex  Anacin Extra Strength Analgesic tablets or capsules CP-2 Tablets Lanoril Salicylate  Anaprox Cuprimine Capsules Levenox Salocol  Anexsia-D Dalteparin Magan Salsalate  Anodynos Darvon compound Magnesium Salicylate Sine-off  Ansaid Dasin Capsules Magsal Sodium Salicylate  Anturane Depen Capsules Marnal Soma  APF Arthritis pain formula Dewitt's Pills Measurin Stanback  Argesic Dia-Gesic Meclofenamic Sulfinpyrazone  Arthritis Bayer Timed Release Aspirin Diclofenac Meclomen Sulindac  Arthritis pain formula Anacin Dicumarol Medipren Supac  Analgesic (Safety coated) Arthralgen Diffunasal Mefanamic Suprofen  Arthritis Strength Bufferin Dihydrocodeine Mepro Compound Suprol  Arthropan liquid Dopirydamole Methcarbomol with Aspirin Synalgos  ASA tablets/Enseals Disalcid Micrainin Tagament  Ascriptin Doan's Midol Talwin  Ascriptin A/D Dolene Mobidin Tanderil  Ascriptin Extra Strength Dolobid Moblgesic Ticlid  Ascriptin with Codeine Doloprin or Doloprin with Codeine Momentum Tolectin  Asperbuf Duoprin Mono-gesic Trendar  Aspergum Duradyne Motrin or Motrin IB Triminicin  Aspirin plain, buffered or enteric coated Durasal Myochrisine Trigesic  Aspirin Suppositories Easprin Nalfon Trillsate  Aspirin with Codeine Ecotrin Regular or Extra Strength  Naprosyn Uracel  Atromid-S Efficin Naproxen Ursinus  Auranofin Capsules  Elmiron Neocylate Vanquish  Axotal Emagrin Norgesic Verin  Azathioprine Empirin or Empirin with Codeine Normiflo Vitamin E  Azolid Emprazil Nuprin Voltaren  Bayer Aspirin plain, buffered or children's or timed BC Tablets or powders Encaprin Orgaran Warfarin Sodium  Buff-a-Comp Enoxaparin Orudis Zorpin  Buff-a-Comp with Codeine Equegesic Os-Cal-Gesic   Buffaprin Excedrin plain, buffered or Extra Strength Oxalid   Bufferin Arthritis Strength Feldene Oxphenbutazone   Bufferin plain or Extra Strength Feldene Capsules Oxycodone with Aspirin   Bufferin with Codeine Fenoprofen Fenoprofen Pabalate or Pabalate-SF   Buffets II Flogesic Panagesic   Buffinol plain or Extra Strength Florinal or Florinal with Codeine Panwarfarin   Buf-Tabs Flurbiprofen Penicillamine   Butalbital Compound Four-way cold tablets Penicillin   Butazolidin Fragmin Pepto-Bismol   Carbenicillin Geminisyn Percodan   Carna Arthritis Reliever Geopen Persantine   Carprofen Gold's salt Persistin   Chloramphenicol Goody's Phenylbutazone   Chloromycetin Haltrain Piroxlcam   Clmetidine heparin Plaquenil   Cllnoril Hyco-pap Ponstel   Clofibrate Hydroxy chloroquine Propoxyphen         Before stopping any of these medications, be sure to consult the physician who ordered them.  Some, such as Coumadin (Warfarin) are ordered to prevent or treat serious conditions such as "deep thrombosis", "pumonary embolisms", and other heart problems.  The amount of time that you may need off of the medication may also vary with the medication and the reason for which you were taking it.  If you are taking any of these medications, please make sure you notify your pain physician before you undergo any procedures.

## 2016-01-15 NOTE — Progress Notes (Signed)
Subjective:    Patient ID: Stacy Martinez, female    DOB: 1956-06-20, 60 y.o.   MRN: [id number]  HPI  The patient is a 60 year old female who comes to pain management at the request of Dr. Fredia Sorrow for further evaluation and treatment of pain involving the lower back and lower extremity region predominantly the patient has history of degenerative disc disease of the lumbar spine and has undergone prior interventional treatment consisting of lumbar epidural steroid injections in 2006. The patient states that he has been return of significant pain at this time and that she is in hopes of being able to undergo treatment in attempt to decrease severity of symptoms, minimize progression of symptoms, and avoid the need for more involved treatment. The patient describes the pain is aching and annoying D Bill sharp stabbing uncomfortable sensation. The patient stated the pain was associated with walking standing motion. The patient stated the pain decreased with resting sitting sleeping warm showers and baths and lying down and that nerve blocks that hip. We discussed patient's condition and informed patient that we would like to update the MRI of the lumbar region and that we would proceed with lumbar facet, medial branch nerve, blocks at time of return appointment. The patient appeared to be with significant degenerative disc disease of the lumbar spine with significant component of pain due to facet arthropathy with facet syndrome. The patient was with understanding and agreed to suggested treatment plan. The patient denied any recent trauma change in events of daily living because change in symptomatology.  Review of Systems     Cardiovascular: High blood pressure  Pulmonary: Unremarkable  Neurological: Unremarkable  Psychological: Unremarkable  Gastrointestinal: Unremarkable  Genitourinary: Unremarkable  Hematologic: Unremarkable  Endocrine: Diabetes  mellitus  Rheumatological: Unremarkable  Musculoskeletal: Unremarkable  Other significant: Unremarkable      Objective:   Physical Exam  There was tenderness to palpation of the splenius capitis and occipitalis musculature region of mild degree with mild tenderness of the region of the cervical facet cervical paraspinal musculature region as well as the thoracic facet thoracic paraspinal musculature region. Palpation  acromioclavicular and glenohumeral joint regions reproduces minimal discomfort. The patient appeared to be with unremarkable Spurling's maneuver. There was tenderness to palpation of the thoracic region thoracic paraspinal must reason a moderate degree in the lower thoracic paraspinal musculature region. Tinel and Phalen's maneuver were without increase of pain of significant degree and patient appeared to be with bilaterally equal grip strength. There was tenderness over the lumbar paraspinal must reason lumbar facet region a moderate degree with lateral bending rotation extension and palpation of the lumbar facets reproducing severe discomfort. There was moderate to moderately severe tenderness of the PSIS and PII S region. There was mild tenderness of the greater trochanteric region iliotibial band region. Straight leg raise was tolerates approximately 30 without a definite increased pain with dorsiflexion noted. DTRs appeared to be trace at the knees. There was negative clonus negative Homans. Knees were with no increased warmth erythema of the knees. There was negative anterior and posterior drawer signs without ballottement of the patella. T. The patient had mild difficulty attending to stand on tiptoes and heels. No definite sensory deficit or dermatomal distribution detected. Abdomen was nontender with no costovertebral tenderness noted          Assessment & Plan:   Degenerative disc disease lumbar spine  Lumbar facet syndrome  Sacroiliac joint  dysfunction  Diabetes mellitus with diabetic neuropathy  Arthritis  PLAN  Continue present medication  Lumbar facet, medial branch nerve blocks to be performed at time return appointment  F/U PCP A Krebs for evaliation of  BP and general medical  condition  F/U surgical evaluation. May consider pending follow-up evaluations  Ask the nurses and secretary the date of your OPEN lumbar MRI  F/U neurological evaluation. May consider pending follow-up evaluations  May consider radiofrequency rhizolysis or intraspinal procedures pending response to present treatment and F/U evaluation   Patient to call Pain Management Center should patient have concerns prior to scheduled return appointment.

## 2016-01-15 NOTE — Progress Notes (Signed)
Safety precautions to be maintained throughout the outpatient stay will include: orient to surroundings, keep bed in low position, maintain call bell within reach at all times, provide assistance with transfer out of bed and ambulation.  

## 2016-01-22 LAB — TOXASSURE SELECT 13 (MW), URINE

## 2016-01-22 NOTE — Progress Notes (Signed)
Quick Note:  Reviewed. ______ 

## 2016-01-30 ENCOUNTER — Ambulatory Visit: Payer: BLUE CROSS/BLUE SHIELD | Attending: Pain Medicine | Admitting: Pain Medicine

## 2016-01-30 ENCOUNTER — Encounter: Payer: Self-pay | Admitting: Pain Medicine

## 2016-01-30 VITALS — BP 152/67 | HR 70 | Temp 97.7°F | Resp 13 | Ht 62.0 in | Wt 250.0 lb

## 2016-01-30 DIAGNOSIS — M545 Low back pain: Secondary | ICD-10-CM | POA: Insufficient documentation

## 2016-01-30 DIAGNOSIS — M533 Sacrococcygeal disorders, not elsewhere classified: Secondary | ICD-10-CM

## 2016-01-30 DIAGNOSIS — M47816 Spondylosis without myelopathy or radiculopathy, lumbar region: Secondary | ICD-10-CM

## 2016-01-30 DIAGNOSIS — M170 Bilateral primary osteoarthritis of knee: Secondary | ICD-10-CM

## 2016-01-30 DIAGNOSIS — M79606 Pain in leg, unspecified: Secondary | ICD-10-CM | POA: Diagnosis present

## 2016-01-30 DIAGNOSIS — E0942 Drug or chemical induced diabetes mellitus with neurological complications with diabetic polyneuropathy: Secondary | ICD-10-CM

## 2016-01-30 MED ORDER — CEFAZOLIN SODIUM 1-5 GM-% IV SOLN
1.0000 g | Freq: Once | INTRAVENOUS | Status: AC
  Administered 2016-01-30: 1 g via INTRAVENOUS

## 2016-01-30 MED ORDER — LACTATED RINGERS IV SOLN
1000.0000 mL | INTRAVENOUS | Status: DC
  Administered 2016-01-30: 1000 mL via INTRAVENOUS

## 2016-01-30 MED ORDER — MIDAZOLAM HCL 5 MG/5ML IJ SOLN
5.0000 mg | Freq: Once | INTRAMUSCULAR | Status: AC
  Administered 2016-01-30: 3 mg via INTRAVENOUS

## 2016-01-30 MED ORDER — ORPHENADRINE CITRATE 30 MG/ML IJ SOLN
60.0000 mg | Freq: Once | INTRAMUSCULAR | Status: AC
  Administered 2016-01-30: 60 mg via INTRAMUSCULAR

## 2016-01-30 MED ORDER — CEFUROXIME AXETIL 250 MG PO TABS: 250.0000 mg | ORAL_TABLET | Freq: Two times a day (BID) | ORAL | Status: DC

## 2016-01-30 MED ORDER — CEFAZOLIN SODIUM 1 G IJ SOLR
INTRAMUSCULAR | Status: AC
  Administered 2016-01-30: 10:00:00
  Filled 2016-01-30: qty 10

## 2016-01-30 MED ORDER — FENTANYL CITRATE (PF) 100 MCG/2ML IJ SOLN
100.0000 ug | Freq: Once | INTRAMUSCULAR | Status: AC
  Administered 2016-01-30: 100 ug via INTRAVENOUS

## 2016-01-30 MED ORDER — BUPIVACAINE HCL (PF) 0.25 % IJ SOLN
30.0000 mL | Freq: Once | INTRAMUSCULAR | Status: AC
Start: 2016-01-30 — End: 2016-01-30
  Administered 2016-01-30: 30 mL

## 2016-01-30 NOTE — Progress Notes (Signed)
Subjective:    Patient ID: Stacy Martinez, female    DOB: [birth year], 60 y.o.   MRN: [id number]  HPI  PROCEDURE PERFORMED: Lumbar facet (medial branch block)   NOTE: The patient is a 59 y.o. female who returns to [place] for further evaluation and treatment of pain involving the lumbar and lower extremity region.The patient has severe reproduction of pain with palpation over the lumbar facet lumbar paraspinal musculature region with extension and palpation over the lumbar facets reproducing severe disabling pain. 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 The risks, benefits, and expectations of the procedure have been discussed and explained to the patient who was understanding and in agreement with suggested treatment plan. We will proceed with interventional treatment as discussed and as explained to the patient who was understanding and wished to proceed with procedure as planned.   DESCRIPTION OF PROCEDURE: Lumbar facet (medial branch block) with IV Versed, IV fentanyl conscious sedation, EKG, blood pressure, pulse, capnography, and pulse oximetry monitoring. The procedure was performed with the patient in the prone position. Betadine prep of proposed entry site performed.   NEEDLE PLACEMENT AT: Left L 2 lumbar facet (medial branch block). Under fluoroscopic guidance with oblique orientation of 15 degrees, a 22-gauge needle was inserted at the L 2 vertebral body level with needle placed at the targeted area of Burton's Eye or Eye of the Scotty Dog with documentation of needle placement in the superior and lateral border of targeted area of Burton's Eye or Eye of the Scotty Dog with oblique orientation of 15 degrees. Following documentation of needle placement at the L 2 vertebral body level, needle placement was then accomplished at the L 3 vertebral body level.   NEEDLE PLACEMENT AT L3, L4, and L5  VERTEBRAL BODY LEVELS ON THE LEFT SIDE The procedure was performed at the L3, L4, and L5 vertebral body levels exactly as was performed at the L 2 vertebral body level utilizing the same technique and under fluoroscopic guidance.  NEEDLE PLACEMENT AT THE SACRAL ALA with AP view of the lumbosacral spine. With the patient in the prone position, Betadine prep of proposed entry site accomplished, a 22 gauge needle was inserted in the region of the sacral ala (groove formed by the superior articulating process of S1 and the sacral wing). Following documentation of needle placement at the sacral ala,    Needle placement was then verified at all levels on lateral view. Following documentation of needle placement at all levels on lateral view and following negative aspiration for heme and CSF, each level was injected with 1 mL of 0.25% bupivacaine with Kenalog.     LUMBAR FACET, MEDIAL BRANCH NERVE, BLOCKS PERFORMED ON THE RIGHT SIDE   The procedure was performed on the right side exactly as was performed on the left side at the same levels and utilizing the same technique under fluoroscopic guidance.     The patient tolerated the procedure well. A total of 40 mg of Kenalog was utilized for the procedure.   PLAN:  1. Medications: The patient will continue presently prescribed medications. 2. May consider modification of treatment regimen at time of return appointment pending response to treatment rendered on today's visit. 3. The patient is to follow-up with primary care physician  A Krebs for further evaluation of blood pressure and general medical condition status post steroid injection performed on today's visit. 4. Surgical follow-up evaluation.  Has been addressed 5. Neurological follow-up evaluation. May consider PNCV EMG studies and other studies 6. The patient may be candidate for radiofrequency procedures, implantation type procedures, and other treatment pending response to treatment and  follow-up evaluation. 7. The patient has been advised to call the [facility] Management Center prior to scheduled return appointment should there be significant change in condition or should patient have other concerns regarding condition prior to scheduled return appointment.  The patient is understanding and in agreement with suggested treatment plan.   Review of Systems     Objective:   Physical Exam        Assessment & Plan:

## 2016-01-30 NOTE — Progress Notes (Signed)
Patient here for procedure d/t lower back pain. Safety precautions to be maintained throughout the outpatient stay will include: orient to surroundings, keep bed in low position, maintain call bell within reach at all times, provide assistance with transfer out of bed and ambulation.  

## 2016-01-30 NOTE — Patient Instructions (Addendum)
PLAN  Continue present medications. Please obtain Ceftin antibiotic today and begin taking Ceftin antibiotic today as prescribed   F/U PCP A Krebs for evaliation of  BP diabetes mellitus  and general medical  condition  F/U surgical evaluation. May consider pending follow-up evaluations  Ask the nurses and secretary the date of your OPEN lumbar MRI  F/U neurological evaluation. May consider  PNCV/EMG studies and other studies pending follow-up evaluations  May consider radiofrequency rhizolysis or intraspinal procedures pending response to present treatment and F/U evaluation   Patient to call Pain Management Center should patient have concerns prior to scheduled return appointment.   GENERAL RISKS AND COMPLICATIONS  What are the risk, side effects and possible complications? Generally speaking, most procedures are safe.  However, with any procedure there are risks, side effects, and the possibility of complications.  The risks and complications are dependent upon the sites that are lesioned, or the type of nerve block to be performed.  The closer the procedure is to the spine, the more serious the risks are.  Great care is taken when placing the radio frequency needles, block needles or lesioning probes, but sometimes complications can occur. 1. Infection: Any time there is an injection through the skin, there is a risk of infection.  This is why sterile conditions are used for these blocks.  There are four possible types of infection. 1. Localized skin infection. 2. Central Nervous System Infection-This can be in the form of Meningitis, which can be deadly. 3. Epidural Infections-This can be in the form of an epidural abscess, which can cause pressure inside of the spine, causing compression of the spinal cord with subsequent paralysis. This would require an emergency surgery to decompress, and there are no guarantees that the patient would recover from the paralysis. 4. Discitis-This is an  infection of the intervertebral discs.  It occurs in about 1% of discography procedures.  It is difficult to treat and it may lead to surgery.        2. Pain: the needles have to go through skin and soft tissues, will cause soreness.       3. Damage to internal structures:  The nerves to be lesioned may be near blood vessels or    other nerves which can be potentially damaged.       4. Bleeding: Bleeding is more common if the patient is taking blood thinners such as  aspirin, Coumadin, Ticiid, Plavix, etc., or if he/she have some genetic predisposition  such as hemophilia. Bleeding into the spinal canal can cause compression of the spinal  cord with subsequent paralysis.  This would require an emergency surgery to  decompress and there are no guarantees that the patient would recover from the  paralysis.       5. Pneumothorax:  Puncturing of a lung is a possibility, every time a needle is introduced in  the area of the chest or upper back.  Pneumothorax refers to free air around the  collapsed lung(s), inside of the thoracic cavity (chest cavity).  Another two possible  complications related to a similar event would include: Hemothorax and Chylothorax.   These are variations of the Pneumothorax, where instead of air around the collapsed  lung(s), you may have blood or chyle, respectively.       6. Spinal headaches: They may occur with any procedures in the area of the spine.       7. Persistent CSF (Cerebro-Spinal Fluid) leakage: This is a rare problem,  but may occur  with prolonged intrathecal or epidural catheters either due to the formation of a fistulous  track or a dural tear.       8. Nerve damage: By working so close to the spinal cord, there is always a possibility of  nerve damage, which could be as serious as a permanent spinal cord injury with  paralysis.       9. Death:  Although rare, severe deadly allergic reactions known as "Anaphylactic  reaction" can occur to any of the medications used.       10. Worsening of the symptoms:  We can always make thing worse.  What are the chances of something like this happening? Chances of any of this occuring are extremely low.  By statistics, you have more of a chance of getting killed in a motor vehicle accident: while driving to the hospital than any of the above occurring .  Nevertheless, you should be aware that they are possibilities.  In general, it is similar to taking a shower.  Everybody knows that you can slip, hit your head and get killed.  Does that mean that you should not shower again?  Nevertheless always keep in mind that statistics do not mean anything if you happen to be on the wrong side of them.  Even if a procedure has a 1 (one) in a 1,000,000 (million) chance of going wrong, it you happen to be that one..Also, keep in mind that by statistics, you have more of a chance of having something go wrong when taking medications.  Who should not have this procedure? If you are on a blood thinning medication (e.g. Coumadin, Plavix, see list of "Blood Thinners"), or if you have an active infection going on, you should not have the procedure.  If you are taking any blood thinners, please inform your physician.  How should I prepare for this procedure?  Do not eat or drink anything at least six hours prior to the procedure.  Bring a driver with you .  It cannot be a taxi.  Come accompanied by an adult that can drive you back, and that is strong enough to help you if your legs get weak or numb from the local anesthetic.  Take all of your medicines the morning of the procedure with just enough water to swallow them.  If you have diabetes, make sure that you are scheduled to have your procedure done first thing in the morning, whenever possible.  If you have diabetes, take only half of your insulin dose and notify our nurse that you have done so as soon as you arrive at the clinic.  If you are diabetic, but only take blood sugar pills  (oral hypoglycemic), then do not take them on the morning of your procedure.  You may take them after you have had the procedure.  Do not take aspirin or any aspirin-containing medications, at least eleven (11) days prior to the procedure.  They may prolong bleeding.  Wear loose fitting clothing that may be easy to take off and that you would not mind if it got stained with Betadine or blood.  Do not wear any jewelry or perfume  Remove any nail coloring.  It will interfere with some of our monitoring equipment.  NOTE: Remember that this is not meant to be interpreted as a complete list of all possible complications.  Unforeseen problems may occur.  BLOOD THINNERS The following drugs contain aspirin or other products, which can cause  increased bleeding during surgery and should not be taken for 2 weeks prior to and 1 week after surgery.  If you should need take something for relief of minor pain, you may take acetaminophen which is found in Tylenol,m Datril, Anacin-3 and Panadol. It is not blood thinner. The products listed below are.  Do not take any of the products listed below in addition to any listed on your instruction sheet.  A.P.C or A.P.C with Codeine Codeine Phosphate Capsules #3 Ibuprofen Ridaura  ABC compound Congesprin Imuran rimadil  Advil Cope Indocin Robaxisal  Alka-Seltzer Effervescent Pain Reliever and Antacid Coricidin or Coricidin-D  Indomethacin Rufen  Alka-Seltzer plus Cold Medicine Cosprin Ketoprofen S-A-C Tablets  Anacin Analgesic Tablets or Capsules Coumadin Korlgesic Salflex  Anacin Extra Strength Analgesic tablets or capsules CP-2 Tablets Lanoril Salicylate  Anaprox Cuprimine Capsules Levenox Salocol  Anexsia-D Dalteparin Magan Salsalate  Anodynos Darvon compound Magnesium Salicylate Sine-off  Ansaid Dasin Capsules Magsal Sodium Salicylate  Anturane Depen Capsules Marnal Soma  APF Arthritis pain formula Dewitt's Pills Measurin Stanback  Argesic Dia-Gesic  Meclofenamic Sulfinpyrazone  Arthritis Bayer Timed Release Aspirin Diclofenac Meclomen Sulindac  Arthritis pain formula Anacin Dicumarol Medipren Supac  Analgesic (Safety coated) Arthralgen Diffunasal Mefanamic Suprofen  Arthritis Strength Bufferin Dihydrocodeine Mepro Compound Suprol  Arthropan liquid Dopirydamole Methcarbomol with Aspirin Synalgos  ASA tablets/Enseals Disalcid Micrainin Tagament  Ascriptin Doan's Midol Talwin  Ascriptin A/D Dolene Mobidin Tanderil  Ascriptin Extra Strength Dolobid Moblgesic Ticlid  Ascriptin with Codeine Doloprin or Doloprin with Codeine Momentum Tolectin  Asperbuf Duoprin Mono-gesic Trendar  Aspergum Duradyne Motrin or Motrin IB Triminicin  Aspirin plain, buffered or enteric coated Durasal Myochrisine Trigesic  Aspirin Suppositories Easprin Nalfon Trillsate  Aspirin with Codeine Ecotrin Regular or Extra Strength Naprosyn Uracel  Atromid-S Efficin Naproxen Ursinus  Auranofin Capsules Elmiron Neocylate Vanquish  Axotal Emagrin Norgesic Verin  Azathioprine Empirin or Empirin with Codeine Normiflo Vitamin E  Azolid Emprazil Nuprin Voltaren  Bayer Aspirin plain, buffered or children's or timed BC Tablets or powders Encaprin Orgaran Warfarin Sodium  Buff-a-Comp Enoxaparin Orudis Zorpin  Buff-a-Comp with Codeine Equegesic Os-Cal-Gesic   Buffaprin Excedrin plain, buffered or Extra Strength Oxalid   Bufferin Arthritis Strength Feldene Oxphenbutazone   Bufferin plain or Extra Strength Feldene Capsules Oxycodone with Aspirin   Bufferin with Codeine Fenoprofen Fenoprofen Pabalate or Pabalate-SF   Buffets II Flogesic Panagesic   Buffinol plain or Extra Strength Florinal or Florinal with Codeine Panwarfarin   Buf-Tabs Flurbiprofen Penicillamine   Butalbital Compound Four-way cold tablets Penicillin   Butazolidin Fragmin Pepto-Bismol   Carbenicillin Geminisyn Percodan   Carna Arthritis Reliever Geopen Persantine   Carprofen Gold's salt Persistin    Chloramphenicol Goody's Phenylbutazone   Chloromycetin Haltrain Piroxlcam   Clmetidine heparin Plaquenil   Cllnoril Hyco-pap Ponstel   Clofibrate Hydroxy chloroquine Propoxyphen         Before stopping any of these medications, be sure to consult the physician who ordered them.  Some, such as Coumadin (Warfarin) are ordered to prevent or treat serious conditions such as "deep thrombosis", "pumonary embolisms", and other heart problems.  The amount of time that you may need off of the medication may also vary with the medication and the reason for which you were taking it.  If you are taking any of these medications, please make sure you notify your pain physician before you undergo any procedures.  Pain Management Discharge Instructions  General Discharge Instructions :  If you need to reach your doctor call: Monday-Friday 8:00 am -  4:00 pm at [phone] or toll free [phone].  After clinic hours [phone] to have operator reach doctor.  Bring all of your medication bottles to all your appointments in the pain clinic.  To cancel or reschedule your appointment with Pain Management please remember to call 24 hours in advance to avoid a fee.  Refer to the educational materials which you have been given on: General Risks, I had my Procedure. Discharge Instructions, Post Sedation.  Post Procedure Instructions:  The drugs you were given will stay in your system until tomorrow, so for the next 24 hours you should not drive, make any legal decisions or drink any alcoholic beverages.  You may eat anything you prefer, but it is better to start with liquids then soups and crackers, and gradually work up to solid foods.  Please notify your doctor immediately if you have any unusual bleeding, trouble breathing or pain that is not related to your normal pain.  Depending on the type of procedure that was done, some parts of your body may feel week and/or numb.  This usually clears up by  tonight or the next day.  Walk with the use of an assistive device or accompanied by an adult for the 24 hours.  You may use ice on the affected area for the first 24 hours.  Put ice in a Ziploc bag and cover with a towel and place against area 15 minutes on 15 minutes off.  You may switch to heat after 24 hours.

## 2016-01-31 ENCOUNTER — Telehealth: Payer: Self-pay

## 2016-01-31 NOTE — Telephone Encounter (Signed)
"  I feel great" I don't hurt", denies any needs at this time.

## 2016-02-20 ENCOUNTER — Encounter: Payer: Self-pay | Admitting: Pain Medicine

## 2016-02-20 ENCOUNTER — Ambulatory Visit: Payer: BLUE CROSS/BLUE SHIELD | Attending: Pain Medicine | Admitting: Pain Medicine

## 2016-02-20 VITALS — BP 136/65 | HR 97 | Temp 98.5°F | Resp 15 | Ht 62.0 in | Wt 250.0 lb

## 2016-02-20 DIAGNOSIS — E1140 Type 2 diabetes mellitus with diabetic neuropathy, unspecified: Secondary | ICD-10-CM | POA: Insufficient documentation

## 2016-02-20 DIAGNOSIS — M545 Low back pain: Secondary | ICD-10-CM | POA: Diagnosis present

## 2016-02-20 DIAGNOSIS — M170 Bilateral primary osteoarthritis of knee: Secondary | ICD-10-CM

## 2016-02-20 DIAGNOSIS — E0942 Drug or chemical induced diabetes mellitus with neurological complications with diabetic polyneuropathy: Secondary | ICD-10-CM

## 2016-02-20 DIAGNOSIS — M1990 Unspecified osteoarthritis, unspecified site: Secondary | ICD-10-CM | POA: Insufficient documentation

## 2016-02-20 DIAGNOSIS — M62830 Muscle spasm of back: Secondary | ICD-10-CM | POA: Insufficient documentation

## 2016-02-20 DIAGNOSIS — M533 Sacrococcygeal disorders, not elsewhere classified: Secondary | ICD-10-CM | POA: Diagnosis not present

## 2016-02-20 DIAGNOSIS — M5136 Other intervertebral disc degeneration, lumbar region: Secondary | ICD-10-CM | POA: Diagnosis not present

## 2016-02-20 DIAGNOSIS — M47816 Spondylosis without myelopathy or radiculopathy, lumbar region: Secondary | ICD-10-CM

## 2016-02-20 MED ORDER — TRAMADOL HCL 50 MG PO TABS: ORAL_TABLET | ORAL | Status: DC

## 2016-02-20 NOTE — Progress Notes (Signed)
Subjective:    Patient ID: Stacy Martinez, female    DOB: Feb 20, 1956, 60 y.o.   MRN: [id number]  HPI  Patient is 60 year old female who returns to pain management for further evaluation and treatment of pain involving the lower back region predominantly. The patient's pain has been aggravated by standing walking twisting turning maneuvers. The patient denies any trauma change in events of daily living the call significant change in symptomatology. The patient states that she had great relief following lumbar facet, medial branch nerve blocks. We discussed patient's condition and decision was made to proceed with lumbar facet, medial branch nerve blocks at time return appointment. We also prescribed tramadol for patient today and we will observe response to the present treatment regimen. The patient stated that she also experience significant pain involving the region of the knees with pain radiating from the lumbar region to the level of the knees predominantly at times. We will observe response to lumbar facet, medial branch nerve, blocks to be performed at time return appointment and consider patient for additional modifications of treatment regimen. The patient may also be a candidate for genicular nerve blocks of the knees we will proceed with lumbar facet, medial branch nerve blocks at time return appointment and consider additional modifications of treatment regimen pending follow-up evaluation. All agreed to suggested treatment plan   Review of Systems     Objective:   Physical Exam   There was tenderness of the splenius capitis and occipitalis region a mild degree with mild tenderness over the cervical facet cervical paraspinal musculature region. There was mild tenderness of the thoracic facet thoracic paraspinal musculature region. The patient was with mild tenderness of the acromioclavicular and glenohumeral joint region. The patient appeared to be with bilaterally equal grip strength with  Tinel and Phalen's maneuver reproducing minimal discomfort palpation over the region of the thoracic region was with evidence of muscle spasms of the lower thoracic region. Palpation over the lumbar paraspinal musculatures and lumbar facet region was attends to palpation with increased pain with lateral bending rotation extension and palpation of the lumbar facets reproducing moderate discomfort. There was moderate tenderness of the PSIS and PII S region with straight leg raising tolerates approximately 30 without increased pain with dorsiflexion noted. EHL strength appeared to be decreased. There was no sensory deficit of dermatomal distribution detected. The knees were with tenderness to palpation with negative anterior and posterior drawer signs without ballottement of the patella. There was crepitus of the knees with increased pain of the knees with range of motion maneuvers of the knees. Was negative clonus negative Homans. Abdomen nontender with no costovertebral tenderness noted     Assessment & Plan:    Degenerative disc disease lumbar spine  Lumbar facet syndrome  Sacroiliac joint dysfunction  Diabetes mellitus with diabetic neuropathy  Arthritis      PLAN  Continue present medication We will BEGIN  TRAMADOL today  CAUTION Medication can cause respiratory depression and cause you to stop breathing, cause excessive sedation, cause confusion and other side effects.  Exercise extreme caution when taking medication and call EMS or go to the Emergency Department immediately if you develop any of these symptoms   Lumbar facet, medial branch nerve, blocks to be performed at time return appointment as discussed  F/U PCP A Krebs for evaliation of  BP and general medical  condition  F/U surgical evaluation. May consider pending follow-up evaluations  Ask the nurses and secretary the date of your OPEN  lumbar MRI  F/U neurological evaluation. May consider pending follow-up  evaluations  May consider radiofrequency rhizolysis or intraspinal procedures pending response to present treatment and F/U evaluation   Patient to call Pain Management Center should patient have concerns prior to scheduled return appointment.

## 2016-02-20 NOTE — Progress Notes (Signed)
Safety precautions to be maintained throughout the outpatient stay will include: orient to surroundings, keep bed in low position, maintain call bell within reach at all times, provide assistance with transfer out of bed and ambulation.  

## 2016-02-20 NOTE — Patient Instructions (Addendum)
PLAN  Continue present medication We will BEGIN tramadol today  CAUTION Medication can cause respiratory depression and cause you to stop breathing, cause excessive sedation, cause confusion and other side effects.  Exercise extreme caution when taking medication and call EMS or go to the Emergency Department immediately if you develop any of these symptoms   Lumbar facet, medial branch nerve, blocks to be performed at time return appointment as discussed  F/U PCP A Krebs for evaliation of  BP and general medical  condition  F/U surgical evaluation. May consider pending follow-up evaluations  Ask the nurses and secretary the date of your OPEN lumbar MRI  F/U neurological evaluation. May consider pending follow-up evaluations  May consider radiofrequency rhizolysis or intraspinal procedures pending response to present treatment and F/U evaluation   Patient to call [facility] should patient have concerns prior to scheduled return appointment. Facet Joint Block The facet joints connect the bones of the spine (vertebrae). They make it possible for you to bend, twist, and make other movements with your spine. They also prevent you from overbending, overtwisting, and making other excessive movements.  A facet joint block is a procedure where a numbing medicine (anesthetic) is injected into a facet joint. Often, a type of anti-inflammatory medicine called a steroid is also injected. A facet joint block may be done for two reasons:   Diagnosis. A facet joint block may be done as a test to see whether neck or back pain is caused by a worn-down or infected facet joint. If the pain gets better after a facet joint block, it means the pain is probably coming from the facet joint. If the pain does not get better, it means the pain is probably not coming from the facet joint.   Therapy. A facet joint block may be done to relieve neck or back pain caused by a facet joint. A facet joint block is  only done as a therapy if the pain does not improve with medicine, exercise programs, physical therapy, and other forms of pain management. LET [facility] CARE PROVIDER KNOW ABOUT:   Any allergies you have.   All medicines you are taking, including vitamins, herbs, eyedrops, and over-the-counter medicines and creams.   Previous problems you or members of your family have had with the use of anesthetics.   Any blood disorders you have had.   Other health problems you have. RISKS AND COMPLICATIONS Generally, having a facet joint block is safe. However, as with any procedure, complications can occur. Possible complications associated with having a facet joint block include:   Bleeding.   Injury to a nerve near the injection site.   Pain at the injection site.   Weakness or numbness in areas controlled by nerves near the injection site.   Infection.   Temporary fluid retention.   Allergic reaction to anesthetics or medicines used during the procedure. BEFORE THE PROCEDURE   Follow your health care provider's instructions if you are taking dietary supplements or medicines. You may need to stop taking them or reduce your dosage.   Do not take any new dietary supplements or medicines without asking your health care provider first.   Follow your health care provider's instructions about eating and drinking before the procedure. You may need to stop eating and drinking several hours before the procedure.   Arrange to have an adult drive you home after the procedure. PROCEDURE  You may need to remove your clothing and dress in an open-back  gown so that your health care provider can access your spine.   The procedure will be done while you are lying on an X-ray table. Most of the time you will be asked to lie on your stomach, but you may be asked to lie in a different position if an injection will be made in your neck.   Special machines will be used to monitor your  oxygen levels, heart rate, and blood pressure.   If an injection will be made in your neck, an intravenous (IV) tube will be inserted into one of your veins. Fluids and medicine will flow directly into your body through the IV tube.   The area over the facet joint where the injection will be made will be cleaned with an antiseptic soap. The surrounding skin will be covered with sterile drapes.   An anesthetic will be applied to your skin to make the injection area numb. You may feel a temporary stinging or burning sensation.   A video X-ray machine will be used to locate the joint. A contrast dye may be injected into the facet joint area to help with locating the joint.   When the joint is located, an anesthetic medicine will be injected into the joint through the needle.   Your health care provider will ask you whether you feel pain relief. If you do feel relief, a steroid may be injected to provide pain relief for a longer period of time. If you do not feel relief or feel only partial relief, additional injections of an anesthetic may be made in other facet joints.   The needle will be removed, the skin will be cleansed, and bandages will be applied.  AFTER THE PROCEDURE   You will be observed for 15-30 minutes before being allowed to go home. Do not drive. Have an adult drive you or take a taxi or public transportation instead.   If you feel pain relief, the pain will return in several hours or days when the anesthetic wears off.   You may feel pain relief 2-14 days after the procedure. The amount of time this relief lasts varies from person to person.   It is normal to feel some tenderness over the injected area(s) for 2 days following the procedure.   If you have diabetes, you may have a temporary increase in blood sugar.   This information is not intended to replace advice given to you by your health care provider. Make sure you discuss any questions you have with your  health care provider.   Document Released: 01/07/2007 Document Revised: 09/08/2014 Document Reviewed: 06/07/2012 Elsevier Interactive Patient Education 2016 [place]  What are the risk, side effects and possible complications? Generally speaking, most procedures are safe.  However, with any procedure there are risks, side effects, and the possibility of complications.  The risks and complications are dependent upon the sites that are lesioned, or the type of nerve block to be performed.  The closer the procedure is to the spine, the more serious the risks are.  Great care is taken when placing the radio frequency needles, block needles or lesioning probes, but sometimes complications can occur.  Infection: Any time there is an injection through the skin, there is a risk of infection.  This is why sterile conditions are used for these blocks.  There are four possible types of infection.  Localized skin infection.  Central Nervous System Infection-This can be in the form of Meningitis,  which can be deadly.  Epidural Infections-This can be in the form of an epidural abscess, which can cause pressure inside of the spine, causing compression of the spinal cord with subsequent paralysis. This would require an emergency surgery to decompress, and there are no guarantees that the patient would recover from the paralysis.  Discitis-This is an infection of the intervertebral discs.  It occurs in about 1% of discography procedures.  It is difficult to treat and it may lead to surgery.        2. Pain: the needles have to go through skin and soft tissues, will cause soreness.       3. Damage to internal structures:  The nerves to be lesioned may be near blood vessels or    other nerves which can be potentially damaged.       4. Bleeding: Bleeding is more common if the patient is taking blood thinners such as  aspirin, Coumadin, Ticiid, Plavix, etc., or if he/she have  some genetic predisposition  such as hemophilia. Bleeding into the spinal canal can cause compression of the spinal  cord with subsequent paralysis.  This would require an emergency surgery to  decompress and there are no guarantees that the patient would recover from the  paralysis.       5. Pneumothorax:  Puncturing of a lung is a possibility, every time a needle is introduced in  the area of the chest or upper back.  Pneumothorax refers to free air around the  collapsed lung(s), inside of the thoracic cavity (chest cavity).  Another two possible  complications related to a similar event would include: Hemothorax and Chylothorax.   These are variations of the Pneumothorax, where instead of air around the collapsed  lung(s), you may have blood or chyle, respectively.       6. Spinal headaches: They may occur with any procedures in the area of the spine.       7. Persistent CSF (Cerebro-Spinal Fluid) leakage: This is a rare problem, but may occur  with prolonged intrathecal or epidural catheters either due to the formation of a fistulous  track or a dural tear.       8. Nerve damage: By working so close to the spinal cord, there is always a possibility of  nerve damage, which could be as serious as a permanent spinal cord injury with  paralysis.       9. Death:  Although rare, severe deadly allergic reactions known as "Anaphylactic  reaction" can occur to any of the medications used.      10. Worsening of the symptoms:  We can always make thing worse.  What are the chances of something like this happening? Chances of any of this occuring are extremely low.  By statistics, you have more of a chance of getting killed in a motor vehicle accident: while driving to the hospital than any of the above occurring .  Nevertheless, you should be aware that they are possibilities.  In general, it is similar to taking a shower.  Everybody knows that you can slip, hit your head and get killed.  Does that mean that you  should not shower again?  Nevertheless always keep in mind that statistics do not mean anything if you happen to be on the wrong side of them.  Even if a procedure has a 1 (one) in a 1,000,000 (million) chance of going wrong, it you happen to be that one..Also, keep in mind that by statistics,  you have more of a chance of having something go wrong when taking medications.  Who should not have this procedure? If you are on a blood thinning medication (e.g. Coumadin, Plavix, see list of "Blood Thinners"), or if you have an active infection going on, you should not have the procedure.  If you are taking any blood thinners, please inform your physician.  How should I prepare for this procedure?  Do not eat or drink anything at least six hours prior to the procedure.  Bring a driver with you .  It cannot be a taxi.  Come accompanied by an adult that can drive you back, and that is strong enough to help you if your legs get weak or numb from the local anesthetic.  Take all of your medicines the morning of the procedure with just enough water to swallow them.  If you have diabetes, make sure that you are scheduled to have your procedure done first thing in the morning, whenever possible.  If you have diabetes, take only half of your insulin dose and notify our nurse that you have done so as soon as you arrive at the clinic.  If you are diabetic, but only take blood sugar pills (oral hypoglycemic), then do not take them on the morning of your procedure.  You may take them after you have had the procedure.  Do not take aspirin or any aspirin-containing medications, at least eleven (11) days prior to the procedure.  They may prolong bleeding.  Wear loose fitting clothing that may be easy to take off and that you would not mind if it got stained with Betadine or blood.  Do not wear any jewelry or perfume  Remove any nail coloring.  It will interfere with some of our monitoring equipment.  NOTE:  Remember that this is not meant to be interpreted as a complete list of all possible complications.  Unforeseen problems may occur.  BLOOD THINNERS The following drugs contain aspirin or other products, which can cause increased bleeding during surgery and should not be taken for 2 weeks prior to and 1 week after surgery.  If you should need take something for relief of minor pain, you may take acetaminophen which is found in Tylenol,m Datril, Anacin-3 and Panadol. It is not blood thinner. The products listed below are.  Do not take any of the products listed below in addition to any listed on your instruction sheet.  A.P.C or A.P.C with Codeine Codeine Phosphate Capsules #3 Ibuprofen Ridaura  ABC compound Congesprin Imuran rimadil  Advil Cope Indocin Robaxisal  Alka-Seltzer Effervescent Pain Reliever and Antacid Coricidin or Coricidin-D  Indomethacin Rufen  Alka-Seltzer plus Cold Medicine Cosprin Ketoprofen S-A-C Tablets  Anacin Analgesic Tablets or Capsules Coumadin Korlgesic Salflex  Anacin Extra Strength Analgesic tablets or capsules CP-2 Tablets Lanoril Salicylate  Anaprox Cuprimine Capsules Levenox Salocol  Anexsia-D Dalteparin Magan Salsalate  Anodynos Darvon compound Magnesium Salicylate Sine-off  Ansaid Dasin Capsules Magsal Sodium Salicylate  Anturane Depen Capsules Marnal Soma  APF Arthritis pain formula Dewitt's Pills Measurin Stanback  Argesic Dia-Gesic Meclofenamic Sulfinpyrazone  Arthritis Bayer Timed Release Aspirin Diclofenac Meclomen Sulindac  Arthritis pain formula Anacin Dicumarol Medipren Supac  Analgesic (Safety coated) Arthralgen Diffunasal Mefanamic Suprofen  Arthritis Strength Bufferin Dihydrocodeine Mepro Compound Suprol  Arthropan liquid Dopirydamole Methcarbomol with Aspirin Synalgos  ASA tablets/Enseals Disalcid Micrainin Tagament  Ascriptin Doan's Midol Talwin  Ascriptin A/D Dolene Mobidin Tanderil  Ascriptin Extra Strength Dolobid Moblgesic Ticlid   Ascriptin with Codeine  Doloprin or Doloprin with Codeine Momentum Tolectin  Asperbuf Duoprin Mono-gesic Trendar  Aspergum Duradyne Motrin or Motrin IB Triminicin  Aspirin plain, buffered or enteric coated Durasal Myochrisine Trigesic  Aspirin Suppositories Easprin Nalfon Trillsate  Aspirin with Codeine Ecotrin Regular or Extra Strength Naprosyn Uracel  Atromid-S Efficin Naproxen Ursinus  Auranofin Capsules Elmiron Neocylate Vanquish  Axotal Emagrin Norgesic Verin  Azathioprine Empirin or Empirin with Codeine Normiflo Vitamin E  Azolid Emprazil Nuprin Voltaren  Bayer Aspirin plain, buffered or children's or timed BC Tablets or powders Encaprin Orgaran Warfarin Sodium  Buff-a-Comp Enoxaparin Orudis Zorpin  Buff-a-Comp with Codeine Equegesic Os-Cal-Gesic   Buffaprin Excedrin plain, buffered or Extra Strength Oxalid   Bufferin Arthritis Strength Feldene Oxphenbutazone   Bufferin plain or Extra Strength Feldene Capsules Oxycodone with Aspirin   Bufferin with Codeine Fenoprofen Fenoprofen Pabalate or Pabalate-SF   Buffets II Flogesic Panagesic   Buffinol plain or Extra Strength Florinal or Florinal with Codeine Panwarfarin   Buf-Tabs Flurbiprofen Penicillamine   Butalbital Compound Four-way cold tablets Penicillin   Butazolidin Fragmin Pepto-Bismol   Carbenicillin Geminisyn Percodan   Carna Arthritis Reliever Geopen Persantine   Carprofen Gold's salt Persistin   Chloramphenicol Goody's Phenylbutazone   Chloromycetin Haltrain Piroxlcam   Clmetidine heparin Plaquenil   Cllnoril Hyco-pap Ponstel   Clofibrate Hydroxy chloroquine Propoxyphen         Before stopping any of these medications, be sure to consult the physician who ordered them.  Some, such as Coumadin (Warfarin) are ordered to prevent or treat serious conditions such as "deep thrombosis", "pumonary embolisms", and other heart problems.  The amount of time that you may need off of the medication may also vary with the medication  and the reason for which you were taking it.  If you are taking any of these medications, please make sure you notify your pain physician before you undergo any procedures.

## 2016-03-05 ENCOUNTER — Encounter: Payer: Self-pay | Admitting: Family Medicine

## 2016-03-05 ENCOUNTER — Ambulatory Visit (INDEPENDENT_AMBULATORY_CARE_PROVIDER_SITE_OTHER): Payer: BLUE CROSS/BLUE SHIELD | Admitting: Family Medicine

## 2016-03-05 VITALS — BP 126/61 | HR 65 | Temp 98.0°F | Resp 16 | Ht 62.0 in | Wt 255.6 lb

## 2016-03-05 DIAGNOSIS — E785 Hyperlipidemia, unspecified: Secondary | ICD-10-CM

## 2016-03-05 DIAGNOSIS — E1169 Type 2 diabetes mellitus with other specified complication: Secondary | ICD-10-CM

## 2016-03-05 DIAGNOSIS — E1142 Type 2 diabetes mellitus with diabetic polyneuropathy: Secondary | ICD-10-CM | POA: Diagnosis not present

## 2016-03-05 DIAGNOSIS — I10 Essential (primary) hypertension: Secondary | ICD-10-CM | POA: Diagnosis not present

## 2016-03-05 LAB — COMPLETE METABOLIC PANEL WITH GFR
ALT: 44 U/L — ABNORMAL HIGH (ref 6–29)
AST: 40 U/L — ABNORMAL HIGH (ref 10–35)
Albumin: 4 g/dL (ref 3.6–5.1)
Alkaline Phosphatase: 104 U/L (ref 33–130)
BUN: 16 mg/dL (ref 7–25)
CHLORIDE: 104 mmol/L (ref 98–110)
CO2: 28 mmol/L (ref 20–31)
Calcium: 9.2 mg/dL (ref 8.6–10.4)
Creat: 0.89 mg/dL (ref 0.50–0.99)
GFR, Est African American: 81 mL/min (ref >=60)
GFR, Est Non African American: 71 mL/min (ref >=60)
GLUCOSE: 219 mg/dL — AB (ref 65–99)
POTASSIUM: 5.1 mmol/L (ref 3.5–5.3)
SODIUM: 141 mmol/L (ref 135–146)
Total Bilirubin: 0.4 mg/dL (ref 0.2–1.2)
Total Protein: 7.2 g/dL (ref 6.1–8.1)

## 2016-03-05 LAB — POCT GLYCOSYLATED HEMOGLOBIN (HGB A1C): HEMOGLOBIN A1C: 9.7

## 2016-03-05 MED ORDER — FUROSEMIDE 40 MG PO TABS: 80.0000 mg | ORAL_TABLET | Freq: Every day | ORAL | Status: DC

## 2016-03-05 MED ORDER — CANAGLIFLOZIN 300 MG PO TABS: 300.0000 mg | ORAL_TABLET | Freq: Every day | ORAL | Status: DC

## 2016-03-05 NOTE — Assessment & Plan Note (Signed)
Plan to start statin pending resolution of AST/ ALT elevation. Reviewed heart healthy diet.

## 2016-03-05 NOTE — Assessment & Plan Note (Signed)
Controlled. Continue current regimen. 

## 2016-03-05 NOTE — Progress Notes (Signed)
Subjective:    Patient ID: Stacy Martinez, female    DOB: 1956-01-28, 60 y.o.   MRN: [id number]  HPI: Stacy Martinez is a 60 y.o. female presenting on 03/05/2016 for Diabetes   HPI  Pt presents for diabetes follow-up. Has not been taking metformin regularly due to nausea/ stomach upset. Is taking invokana regularly. Weight is down 8lbs since last visit. Numbness and tingling in feet. Needs eye exam:   HTN: Taking twice daily. BP doing well at home. No CP. SOB, dizziness.  Needs to reschedule her appt with cardiology for her cardiomyopathy.   Past Medical History  Diagnosis Date  . Enlarged heart   . Hypertension   . Allergy   . Arthritis   . Diabetes mellitus without complication [facility])     Current Outpatient Prescriptions on File Prior to Visit  Medication Sig  . amLODipine (NORVASC) 10 MG tablet Take 1 tablet (10 mg total) by mouth daily.  . carvedilol (COREG) 25 MG tablet Take 1 tablet (25 mg total) by mouth 2 (two) times daily with a meal.  . cefUROXime (CEFTIN) 250 MG tablet Take 1 tablet (250 mg total) by mouth 2 (two) times daily with a meal.  . enalapril (VASOTEC) 20 MG tablet Take 1 tablet (20 mg total) by mouth 2 (two) times daily.  . fluconazole (DIFLUCAN) 150 MG tablet Take 1 tablet (150 mg total) by mouth once.  Marland Kitchen glucose blood (RELION GLUCOSE TEST STRIPS) test strip Use as instructed  . metFORMIN (GLUCOPHAGE) 1000 MG tablet Take 1 tablet (1,000 mg total) by mouth 2 (two) times daily with a meal.  . ReliOn Ultra Thin Lancets MISC 1 each by Does not apply route daily.  . traMADol (ULTRAM) 50 MG tablet Limit one half to one tab by mouth per day or twice per day if tolerated   Current Facility-Administered Medications on File Prior to Visit  Medication  . lactated ringers infusion 1,000 mL    Review of Systems  Constitutional: Negative for fever and chills.  HENT: Negative.   Respiratory: Negative for cough, chest tightness and wheezing.   Cardiovascular: Negative for  chest pain and leg swelling.  Gastrointestinal: Negative for nausea, vomiting, abdominal pain, diarrhea and constipation.  Endocrine: Negative.  Negative for cold intolerance, heat intolerance, polydipsia, polyphagia and polyuria.  Genitourinary: Negative for dysuria and difficulty urinating.  Musculoskeletal: Negative.   Neurological: Negative for dizziness, light-headedness and numbness.  Psychiatric/Behavioral: Negative.    Per HPI unless specifically indicated above     Objective:    BP 126/61 mmHg  Pulse 65  Temp(Src) 98 F (36.7 C) (Oral)  Resp 16  Ht 5\' 2"  (1.575 m)  Wt 255 lb 9.6 oz (115.939 kg)  BMI 46.74 kg/m2  Wt Readings from Last 3 Encounters:  03/05/16 255 lb 9.6 oz (115.939 kg)  02/20/16 250 lb (113.399 kg)  01/30/16 250 lb (113.399 kg)    Physical Exam  Constitutional: She is oriented to person, place, and time. She appears well-developed and well-nourished.  HENT:  Head: Normocephalic and atraumatic.  Neck: Neck supple.  Cardiovascular: Normal rate, regular rhythm and normal heart sounds.  Exam reveals no gallop and no friction rub.   No murmur heard. Pulmonary/Chest: Effort normal and breath sounds normal. She has no wheezes. She exhibits no tenderness.  Abdominal: Soft. Normal appearance and bowel sounds are normal. She exhibits no distension and no mass. There is no tenderness. There is no rebound and no guarding.  Musculoskeletal: Normal  range of motion. She exhibits no edema or tenderness.  Lymphadenopathy:    She has no cervical adenopathy.  Neurological: She is alert and oriented to person, place, and time.  Skin: Skin is warm and dry.   Diabetic Foot Exam - Simple   Simple Foot Form  Diabetic Foot exam was performed with the following findings:  Yes 03/05/2016  8:58 AM  Visual Inspection  No deformities, no ulcerations, no other skin breakdown bilaterally:  Yes  Sensation Testing  Intact to touch and monofilament testing bilaterally:  Yes  Pulse  Check  Posterior Tibialis and Dorsalis pulse intact bilaterally:  Yes  Comments      Results for orders placed or performed in visit on 03/05/16  POCT HgB A1C  Result Value Ref Range   Hemoglobin A1C 9.7       Assessment & Plan:   Problem List Items Addressed This Visit      Cardiovascular and Mediastinum   BP (high blood pressure)    Controlled. Continue current regimen.       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 Discussed starting insulin- pt would like to defer starting insulin at this time. Plan to increase invokana to 300mg  once daily. Encouraged compliance with metformin. Encouraged diet and lifestyle changes.  UA- done. Eye exam: ordered. Recheck 3 mos.       Relevant Medications   canagliflozin (INVOKANA) 300 MG TABS tablet   Other Relevant Orders   POCT HgB A1C (Completed)   Ambulatory referral to Ophthalmology   COMPLETE METABOLIC PANEL WITH GFR     Other   Hyperlipidemia associated with type 2 diabetes mellitus ([place])    Plan to start statin pending resolution of AST/ ALT elevation. Reviewed heart healthy diet.       Relevant Medications   canagliflozin (INVOKANA) 300 MG TABS tablet   furosemide (LASIX) 40 MG tablet      Meds ordered this encounter  Medications  . canagliflozin (INVOKANA) 300 MG TABS tablet    Sig: Take 1 tablet (300 mg total) by mouth daily before breakfast.    Dispense:  30 tablet    Refill:  11    Order Specific Question:  Supervising Provider    Answer:  Arlis Porta [phone]  . furosemide (LASIX) 40 MG tablet    Sig: Take 2 tablets (80 mg total) by mouth daily.    Dispense:  60 tablet    Refill:  11    Order Specific Question:  Supervising Provider    Answer:  Arlis Porta [phone]      Follow up plan: Return in about 3 months (around 06/05/2016) for Diabetes. Marland Kitchen

## 2016-03-05 NOTE — Assessment & Plan Note (Signed)
A1c with no change. Discussed starting insulin- pt would like to defer starting insulin at this time. Plan to increase invokana to 300mg  once daily. Encouraged compliance with metformin. Encouraged diet and lifestyle changes.  UA- done. Eye exam: ordered. Recheck 3 mos.

## 2016-03-05 NOTE — Patient Instructions (Addendum)
Diabetes: We will go up to 300mg  on the invokana to see if we can impact your sugars. Try to avoid excess sugars- breads, rice pasta. Keep up weight loss.

## 2016-03-08 ENCOUNTER — Other Ambulatory Visit: Payer: Self-pay | Admitting: Family Medicine

## 2016-03-10 ENCOUNTER — Ambulatory Visit: Payer: BLUE CROSS/BLUE SHIELD | Attending: Pain Medicine | Admitting: Pain Medicine

## 2016-03-10 ENCOUNTER — Encounter: Payer: Self-pay | Admitting: Pain Medicine

## 2016-03-10 DIAGNOSIS — M47816 Spondylosis without myelopathy or radiculopathy, lumbar region: Secondary | ICD-10-CM

## 2016-03-10 DIAGNOSIS — M533 Sacrococcygeal disorders, not elsewhere classified: Secondary | ICD-10-CM

## 2016-03-10 DIAGNOSIS — M79606 Pain in leg, unspecified: Secondary | ICD-10-CM | POA: Diagnosis present

## 2016-03-10 DIAGNOSIS — M545 Low back pain: Secondary | ICD-10-CM | POA: Diagnosis present

## 2016-03-10 DIAGNOSIS — E0942 Drug or chemical induced diabetes mellitus with neurological complications with diabetic polyneuropathy: Secondary | ICD-10-CM

## 2016-03-10 DIAGNOSIS — M170 Bilateral primary osteoarthritis of knee: Secondary | ICD-10-CM

## 2016-03-10 MED ORDER — CEFAZOLIN IN D5W 1 GM/50ML IV SOLN
1.0000 g | Freq: Once | INTRAVENOUS | Status: AC
  Administered 2016-03-10: 1 g via INTRAVENOUS

## 2016-03-10 MED ORDER — BUPIVACAINE HCL (PF) 0.25 % IJ SOLN
30.0000 mL | Freq: Once | INTRAMUSCULAR | Status: AC
  Administered 2016-03-10: 30 mL
  Filled 2016-03-10: qty 30

## 2016-03-10 MED ORDER — ORPHENADRINE CITRATE 30 MG/ML IJ SOLN
60.0000 mg | Freq: Once | INTRAMUSCULAR | Status: AC
  Administered 2016-03-10: 60 mg via INTRAMUSCULAR
  Filled 2016-03-10: qty 2

## 2016-03-10 MED ORDER — LACTATED RINGERS IV SOLN
1000.0000 mL | INTRAVENOUS | Status: DC
  Administered 2016-03-10: 1000 mL via INTRAVENOUS

## 2016-03-10 MED ORDER — TRIAMCINOLONE ACETONIDE 40 MG/ML IJ SUSP
40.0000 mg | Freq: Once | INTRAMUSCULAR | Status: AC
  Administered 2016-03-10: 40 mg
  Filled 2016-03-10: qty 1

## 2016-03-10 MED ORDER — CEFAZOLIN SODIUM 1 G IJ SOLR
INTRAMUSCULAR | Status: AC
  Administered 2016-03-10: 13:00:00
  Filled 2016-03-10: qty 10

## 2016-03-10 MED ORDER — FENTANYL CITRATE (PF) 100 MCG/2ML IJ SOLN
100.0000 ug | Freq: Once | INTRAMUSCULAR | Status: AC
  Administered 2016-03-10: 100 ug via INTRAVENOUS
  Filled 2016-03-10: qty 2

## 2016-03-10 MED ORDER — MIDAZOLAM HCL 5 MG/5ML IJ SOLN
5.0000 mg | Freq: Once | INTRAMUSCULAR | Status: AC
  Administered 2016-03-10: 5 mg via INTRAVENOUS
  Filled 2016-03-10: qty 5

## 2016-03-10 MED ORDER — CEFUROXIME AXETIL 250 MG PO TABS: 250.0000 mg | ORAL_TABLET | Freq: Two times a day (BID) | ORAL | Status: DC

## 2016-03-10 NOTE — Patient Instructions (Addendum)
PLAN  Continue present medications. Please obtain Ceftin antibiotic today and begin taking Ceftin antibiotic today as prescribed   F/U PCP A Krebs for evaluation of  BP diabetes mellitus  and general medical  condition  F/U surgical evaluation. May consider pending follow-up evaluations  Ask the nurses and secretary the date of your OPEN lumbar MRI  F/U neurological evaluation. May consider  PNCV/EMG studies and other studies pending follow-up evaluations  May consider radiofrequency rhizolysis or intraspinal procedures pending response to present treatment and F/U evaluation   Patient to call [facility] should patient have concerns prior to scheduled return appointment.  Facet Blocks Patient Information  Description: The facets are joints in the spine between the vertebrae.  Like any joints in the body, facets can become irritated and painful.  Arthritis can also effect the facets.  By injecting steroids and local anesthetic in and around these joints, we can temporarily block the nerve supply to them.  Steroids act directly on irritated nerves and tissues to reduce selling and inflammation which often leads to decreased pain.  Facet blocks may be done anywhere along the spine from the neck to the low back depending upon the location of your pain.   After numbing the skin with local anesthetic (like Novocaine), a small needle is passed onto the facet joints under x-ray guidance.  You may experience a sensation of pressure while this is being done.  The entire block usually lasts about 15-25 minutes.   Conditions which may be treated by facet blocks:   Low back/buttock pain  Neck/shoulder pain  Certain types of headaches  Preparation for the injection:  1. Do not eat any solid food or dairy products within 8 hours of your appointment. 2. You may drink clear liquid up to 3 hours before appointment.  Clear liquids include water, black coffee, juice or soda.  No milk or  cream please. 3. You may take your regular medication, including pain medications, with a sip of water before your appointment.  Diabetics should hold regular insulin (if taken separately) and take 1/2 normal NPH dose the morning of the procedure.  Carry some sugar containing items with you to your appointment. 4. A driver must accompany you and be prepared to drive you home after your procedure. 5. Bring all your current medications with you. 6. An IV may be inserted and sedation may be given at the discretion of the physician. 7. A blood pressure cuff, EKG and other monitors will often be applied during the procedure.  Some patients may need to have extra oxygen administered for a short period. 8. You will be asked to provide medical information, including your allergies and medications, prior to the procedure.  We must know immediately if you are taking blood thinners (like Coumadin/Warfarin) or if you are allergic to IV iodine contrast (dye).  We must know if you could possible be pregnant.  Possible side-effects:   Bleeding from needle site  Infection (rare, may require surgery)  Nerve injury (rare)  Numbness & tingling (temporary)  Difficulty urinating (rare, temporary)  Spinal headache (a headache worse with upright posture)  Light-headedness (temporary)  Pain at injection site (serveral days)  Decreased blood pressure (rare, temporary)  Weakness in arm/leg (temporary)  Pressure sensation in back/neck (temporary)   Call if you experience:   Fever/chills associated with headache or increased back/neck pain  Headache worsened by an upright position  New onset, weakness or numbness of an extremity below the injection site  Hives or difficulty breathing (go to the emergency room)  Inflammation or drainage at the injection site(s)  Severe back/neck pain greater than usual  New symptoms which are concerning to you  Please note:  Although the local anesthetic  injected can often make your back or neck feel good for several hours after the injection, the pain will likely return. It takes 3-7 days for steroids to work.  You may not notice any pain relief for at least one week.  If effective, we will often do a series of 2-3 injections spaced 3-6 weeks apart to maximally decrease your pain.  After the initial series, you may be a candidate for a more permanent nerve block of the facets.  If you have any questions, please call #[phone]) [facility]  Be careful moving about. Muscle spasms in the area of the injection may occur.  Use ice for the next 24 hours (15 minutes on, 15 minutes off).  After 24 hours, you may use heat for comfort if you wish.  Post procedure numbness or redness is expected, average 4-6 hours.  If numbness develops after 4-6 hours and is felt to be progressing and worsening, immediately contact your physician.  A prescription for CEFTIN, DIFLUCAN was sent to your pharmacy and should be available for pickup

## 2016-03-10 NOTE — Progress Notes (Signed)
Safety precautions to be maintained throughout the outpatient stay will include: orient to surroundings, keep bed in low position, maintain call bell within reach at all times, provide assistance with transfer out of bed and ambulation.  Versed given 5mg  total, 2mg  1327. 1mg  1335 2mg  1344

## 2016-03-10 NOTE — Progress Notes (Signed)
Subjective:    Patient ID: Stacy Martinez, female    DOB: 21-Apr-1956, 60 y.o.   MRN: [id number]  HPI  PROCEDURE PERFORMED: Lumbar facet (medial branch block)   NOTE: The patient is a 60 y.o. female who returns to [facility] for further evaluation and treatment of pain involving the lumbar and lower extremity region. 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 The risks, benefits, and expectations of the procedure have been discussed and explained to the patient who was understanding and in agreement with suggested treatment plan. We will proceed with interventional treatment as discussed and as explained to the patient who was understanding and wished to proceed with procedure as planned.   DESCRIPTION OF PROCEDURE: Lumbar facet (medial branch block) with IV Versed, IV fentanyl conscious sedation, EKG, blood pressure, pulse, capnography, and pulse oximetry monitoring. The procedure was performed with the patient in the prone position. Betadine prep of proposed entry site performed.   NEEDLE PLACEMENT AT: Left L 2 lumbar facet (medial branch block). Under fluoroscopic guidance with oblique orientation of 15 degrees, a 22-gauge needle was inserted at the L 2 vertebral body level with needle placed at the targeted area of Burton's Eye or Eye of the [place] with documentation of needle placement in the superior and lateral border of targeted area of Burton's Eye or Eye of the Scotty Dog with oblique orientation of 15 degrees. Following documentation of needle placement at the L 2 vertebral body level, needle placement was then accomplished at the L 3 vertebral body level.   NEEDLE PLACEMENT AT L3, L4, and L5 VERTEBRAL BODY LEVELS ON THE LEFT SIDE The procedure was performed at the L3, L4, and L5 vertebral body levels exactly as was performed at the L 2 vertebral body level utilizing the same technique and under fluoroscopic  guidance.  NEEDLE PLACEMENT AT THE SACRAL ALA with AP view of the lumbosacral spine. With the patient in the prone position, Betadine prep of proposed entry site accomplished, a 22 gauge needle was inserted in the region of the sacral ala (groove formed by the superior articulating process of S1 and the sacral wing). Following documentation of needle placement at the sacral ala,    Needle placement was then verified at all levels on lateral view. Following documentation of needle placement at all levels on lateral view and following negative aspiration for heme and CSF, each level was injected with 1 mL of 0.25% bupivacaine with Kenalog.     LUMBAR FACET, MEDIAL BRANCH NERVE, BLOCKS PERFORMED ON THE RIGHT SIDE   The procedure was performed on the right side exactly as was performed on the left side at the same levels and utilizing the same technique under fluoroscopic guidance.     The patient tolerated the procedure well. A total of 40 mg of Kenalog was utilized for the procedure.   PLAN:  1. Medications: The patient will continue presently prescribed medications. 2. May consider modification of treatment regimen at time of return appointment pending response to treatment rendered on today's visit. 3. The patient is to follow-up with primary care physician Dr.Krebs  for further evaluation of blood pressure and general medical condition status post steroid injection performed on today's visit. 4. Surgical follow-up evaluation.Has been addressed 5. Neurological  follow-up evaluation.May consider PNCV EMG studies and other studies  6. The patient may be candidate for radiofrequency procedures, implantation type procedures, and other treatment pending response to treatment  and follow-up evaluation. 7. The patient has been advised to call the [facility] prior to scheduled return appointment should there be significant change in condition or should patient have other concerns  regarding condition prior to scheduled return appointment.  The patient is understanding and in agreement with suggested treatment plan.   Review of Systems     Objective:   Physical Exam        Assessment & Plan:

## 2016-03-11 ENCOUNTER — Telehealth: Payer: Self-pay | Admitting: *Deleted

## 2016-03-11 NOTE — Telephone Encounter (Signed)
Spoke with patient, denies any questions or concerns re; procedure on yesterday.  

## 2016-03-12 ENCOUNTER — Ambulatory Visit: Payer: BLUE CROSS/BLUE SHIELD | Admitting: Pain Medicine

## 2016-03-13 ENCOUNTER — Other Ambulatory Visit: Payer: Self-pay | Admitting: Family Medicine

## 2016-03-14 MED ORDER — FLUCONAZOLE 150 MG PO TABS: 150.0000 mg | ORAL_TABLET | Freq: Once | ORAL | Status: DC

## 2016-03-14 NOTE — Addendum Note (Signed)
Addended byVincenza Hews, AMY L on: 03/14/2016 12:12 PM   Modules accepted: Orders

## 2016-03-17 ENCOUNTER — Emergency Department
Admission: EM | Admit: 2016-03-17 | Discharge: 2016-03-17 | Disposition: A | Discharge status: Home / Self Care | Payer: BLUE CROSS/BLUE SHIELD | Attending: Emergency Medicine | Admitting: Emergency Medicine

## 2016-03-17 ENCOUNTER — Encounter: Payer: Self-pay | Admitting: *Deleted

## 2016-03-17 ENCOUNTER — Emergency Department: Payer: BLUE CROSS/BLUE SHIELD

## 2016-03-17 DIAGNOSIS — Z792 Long term (current) use of antibiotics: Secondary | ICD-10-CM | POA: Insufficient documentation

## 2016-03-17 DIAGNOSIS — I10 Essential (primary) hypertension: Secondary | ICD-10-CM | POA: Insufficient documentation

## 2016-03-17 DIAGNOSIS — E785 Hyperlipidemia, unspecified: Secondary | ICD-10-CM | POA: Insufficient documentation

## 2016-03-17 DIAGNOSIS — Z79899 Other long term (current) drug therapy: Secondary | ICD-10-CM | POA: Diagnosis not present

## 2016-03-17 DIAGNOSIS — Z7984 Long term (current) use of oral hypoglycemic drugs: Secondary | ICD-10-CM | POA: Diagnosis not present

## 2016-03-17 DIAGNOSIS — M1990 Unspecified osteoarthritis, unspecified site: Secondary | ICD-10-CM | POA: Insufficient documentation

## 2016-03-17 DIAGNOSIS — E1142 Type 2 diabetes mellitus with diabetic polyneuropathy: Secondary | ICD-10-CM | POA: Diagnosis not present

## 2016-03-17 DIAGNOSIS — R0602 Shortness of breath: Secondary | ICD-10-CM

## 2016-03-17 DIAGNOSIS — M5136 Other intervertebral disc degeneration, lumbar region: Secondary | ICD-10-CM | POA: Insufficient documentation

## 2016-03-17 DIAGNOSIS — N179 Acute kidney failure, unspecified: Secondary | ICD-10-CM

## 2016-03-17 DIAGNOSIS — I429 Cardiomyopathy, unspecified: Secondary | ICD-10-CM | POA: Diagnosis not present

## 2016-03-17 LAB — BASIC METABOLIC PANEL
ANION GAP: 11 (ref 5–15)
BUN: 34 mg/dL — AB (ref 6–20)
CALCIUM: 9.8 mg/dL (ref 8.9–10.3)
CO2: 26 mmol/L (ref 22–32)
CREATININE: 1.25 mg/dL — AB (ref 0.44–1.00)
Chloride: 97 mmol/L — ABNORMAL LOW (ref 101–111)
GFR calc Af Amer: 53 mL/min — ABNORMAL LOW (ref >60)
GFR, EST NON AFRICAN AMERICAN: 46 mL/min — AB (ref >60)
GLUCOSE: 215 mg/dL — AB (ref 65–99)
Potassium: 4.9 mmol/L (ref 3.5–5.1)
Sodium: 134 mmol/L — ABNORMAL LOW (ref 135–145)

## 2016-03-17 LAB — CBC
HCT: 41.5 % (ref 35.0–47.0)
Hemoglobin: 14 g/dL (ref 12.0–16.0)
MCH: 27.3 pg (ref 26.0–34.0)
MCHC: 33.6 g/dL (ref 32.0–36.0)
MCV: 81.3 fL (ref 80.0–100.0)
PLATELETS: 420 10*3/uL (ref 150–440)
RBC: 5.1 MIL/uL (ref 3.80–5.20)
RDW: 15.3 % — AB (ref 11.5–14.5)
WBC: 14 10*3/uL — ABNORMAL HIGH (ref 3.6–11.0)

## 2016-03-17 LAB — TROPONIN I

## 2016-03-17 MED ORDER — SODIUM CHLORIDE 0.9 % IV BOLUS (SEPSIS): 1000.0000 mL | Freq: Once | INTRAVENOUS | Status: DC

## 2016-03-17 NOTE — Discharge Instructions (Signed)
You were evaluated for shortness of breath, and although no certain cause was found, your exam and evaluation are reassuring in the emergency department today.  As discussed, that you need to follow up with your cardiologist in the next 1-2 days for further evaluation of some exertional shortness of breath and likely stress testing.  Return to the emergency department for any worsening condition including shortness of breath, can't catch your breath, palpitations, chest pain, pain with breathing, fever, weakness, dizziness, passing out, or any other symptoms concerning to you.  Your blood work showed slight kidney dysfunction called mild acute renal failure consistent with dehydration. Make sure you drink plenty of fluids. Acute Kidney Injury Acute kidney injury is any condition in which there is sudden (acute) damage to the kidneys. Acute kidney injury was previously known as acute kidney failure or acute renal failure. The kidneys are two organs that lie on either side of the spine between the middle of the back and the front of the abdomen. The kidneys:  Remove wastes and extra water from the blood.   Produce important hormones. These help keep bones strong, regulate blood pressure, and help create red blood cells.   Balance the fluids and chemicals in the blood and tissues. A small amount of kidney damage may not cause problems, but a large amount of damage may make it difficult or impossible for the kidneys to work the way they should. Acute kidney injury may develop into long-lasting (chronic) kidney disease. It may also develop into a life-threatening disease called end-stage kidney disease. Acute kidney injury can get worse very quickly, so it should be treated right away. Early treatment may prevent other kidney diseases from developing. CAUSES   A problem with blood flow to the kidneys. This may be caused by:   Blood loss.   Heart disease.   Severe burns.   Liver  disease.  Direct damage to the kidneys. This may be caused by:  Some medicines.   A kidney infection.   Poisoning or consuming toxic substances.   A surgical wound.   A blow to the kidney area.   A problem with urine flow. This may be caused by:   Cancer.   Kidney stones.   An enlarged prostate. SIGNS AND SYMPTOMS   Swelling (edema) of the legs, ankles, or feet.   Tiredness (lethargy).   Nausea or vomiting.   Confusion.   Problems with urination, such as:   Painful or burning feeling during urination.   Decreased urine production.   Frequent accidents in children who are potty trained.   Bloody urine.   Muscle twitches and cramps.   Shortness of breath.   Seizures.   Chest pain or pressure. Sometimes, no symptoms are present. DIAGNOSIS Acute kidney injury may be detected and diagnosed by tests, including blood, urine, imaging, or kidney biopsy tests.  TREATMENT Treatment of acute kidney injury varies depending on the cause and severity of the kidney damage. In mild cases, no treatment may be needed. The kidneys may heal on their own. If acute kidney injury is more severe, your health care provider will treat the cause of the kidney damage, help the kidneys heal, and prevent complications from occurring. Severe cases may require a procedure to remove toxic wastes from the body (dialysis) or surgery to repair kidney damage. Surgery may involve:   Repair of a torn kidney.   Removal of an obstruction. HOME CARE INSTRUCTIONS  Follow your prescribed diet.  Take medicines only as  directed by your health care provider.  Do not take any new medicines (prescription, over-the-counter, or nutritional supplements) unless approved by your health care provider. Many medicines can worsen your kidney damage or may need to have the dose adjusted.   Keep all follow-up visits as directed by your health care provider. This is important.  Observe  your condition to make sure you are healing as expected. SEEK IMMEDIATE MEDICAL CARE IF:  You are feeling ill or have severe pain in the back or side.   Your symptoms return or you have new symptoms.  You have any symptoms of end-stage kidney disease. These include:   Persistent itchiness.   Loss of appetite.   Headaches.   Abnormally dark or light skin.  Numbness in the hands or feet.   Easy bruising.   Frequent hiccups.   Menstruation stops.   You have a fever.  You have increased urine production.  You have pain or bleeding when urinating. MAKE SURE YOU:   Understand these instructions.  Will watch your condition.  Will get help right away if you are not doing well or get worse.   This information is not intended to replace advice given to you by your health care provider. Make sure you discuss any questions you have with your health care provider.   Document Released: 03/03/2011 Document Revised: 09/08/2014 Document Reviewed: 04/16/2012 Elsevier Interactive Patient Education 2016 [place] of Breath Shortness of breath means you have trouble breathing. It could also mean that you have a medical problem. You should get immediate medical care for shortness of breath. CAUSES   Not enough oxygen in the air such as with high altitudes or a smoke-filled room.  Certain lung diseases, infections, or problems.  Heart disease or conditions, such as angina or heart failure.  Low red blood cells (anemia).  Poor physical fitness, which can cause shortness of breath when you exercise.  Chest or back injuries or stiffness.  Being overweight.  Smoking.  Anxiety, which can make you feel like you are not getting enough air. DIAGNOSIS  Serious medical problems can often be found during your physical exam. Tests may also be done to determine why you are having shortness of breath. Tests may include:  Chest X-rays.  Lung function  tests.  Blood tests.  An electrocardiogram (ECG).  An ambulatory electrocardiogram. An ambulatory ECG records your heartbeat patterns over a 24-hour period.  Exercise testing.  A transthoracic echocardiogram (TTE). During echocardiography, sound waves are used to evaluate how blood flows through your heart.  A transesophageal echocardiogram (TEE).  Imaging scans. Your health care provider may not be able to find a cause for your shortness of breath after your exam. In this case, it is important to have a follow-up exam with your health care provider as directed.  TREATMENT  Treatment for shortness of breath depends on the cause of your symptoms and can vary greatly. HOME CARE INSTRUCTIONS   Do not smoke. Smoking is a common cause of shortness of breath. If you smoke, ask for help to quit.  Avoid being around chemicals or things that may bother your breathing, such as paint fumes and dust.  Rest as needed. Slowly resume your usual activities.  If medicines were prescribed, take them as directed for the full length of time directed. This includes oxygen and any inhaled medicines.  Keep all follow-up appointments as directed by your health care provider. SEEK MEDICAL CARE IF:   Your condition does  not improve in the time expected.  You have a hard time doing your normal activities even with rest.  You have any new symptoms. SEEK IMMEDIATE MEDICAL CARE IF:   Your shortness of breath gets worse.  You feel light-headed, faint, or develop a cough not controlled with medicines.  You start coughing up blood.  You have pain with breathing.  You have chest pain or pain in your arms, shoulders, or abdomen.  You have a fever.  You are unable to walk up stairs or exercise the way you normally do. MAKE SURE YOU:  Understand these instructions.  Will watch your condition.  Will get help right away if you are not doing well or get worse.   This information is not intended to  replace advice given to you by your health care provider. Make sure you discuss any questions you have with your health care provider.   Document Released: 05/13/2001 Document Revised: 08/23/2013 Document Reviewed: 11/03/2011 Elsevier Interactive Patient Education [employer].

## 2016-03-17 NOTE — ED Notes (Signed)
Ambulates around department, denies discomfort or SOB, SAT 98 percent after walk.

## 2016-03-17 NOTE — ED Notes (Signed)
PO fluids given, tol well.

## 2016-03-17 NOTE — ED Notes (Signed)
States chest pain, weakness and headache since Sunday morning, pt awake and alert upon arrival

## 2016-03-17 NOTE — ED Provider Notes (Signed)
[facility] Emergency Department Provider Note   ____________________________________________  Time seen: Approximately 3:45pm I have reviewed the triage vital signs and the triage nursing note.  HISTORY  Chief Complaint Shortness of Breath; Headache; and Chest Pain   Historian Patient  HPI Stacy Martinez is a 60 y.o. female with a history of hypertension and diabetes and cardiac myopathy who follows with Dr. Nehemiah Massed, cardiology, presents here for symptom has generalized fatigue over the past weekend, Saturday and Sunday. She works during the week and this week and was doing her typical schedule of cleaning the house and doing laundry and she had episodes where when she was exerting herself she felt a little short of breath and occasionally broke out in a sweat. No chest pain or pressure. No palpitations. No focal weakness or numbness. No confusion altered mental status. Denies fever. Denies coughing. Denies history of asthma or COPD.  In terms of pulmonary embolism risk factors, no prior history of DVT, family history of PE or DVT, not on hormones, and no recent immobilization or car/plane travel.  Today she feels okay, no persistent shortness of breath.    Past Medical History  Diagnosis Date  . Enlarged heart   . Hypertension   . Allergy   . Arthritis   . Diabetes mellitus without complication [facility])     Patient Active Problem List   Diagnosis Date Noted  . Facet syndrome, lumbar 01/15/2016  . Diabetic neuropathy ([place]) 01/15/2016  . DJD (degenerative joint disease) of knee 01/15/2016  . Sacroiliac joint dysfunction 01/15/2016  . Hyperlipidemia associated with type 2 diabetes mellitus ([place]) 11/30/2015  . DDD (degenerative disc disease), lumbar 11/30/2015  . Cardiomyopathy ([place]) 11/30/2015  . Arthritis 11/30/2015  . BP (high blood pressure) 04/18/2015  . DM type 2 with diabetic peripheral neuropathy ([place]) 04/18/2015  . Wound of skin 04/18/2015  .  Bowel obstruction ([place]) 12/19/2013    Past Surgical History  Procedure Laterality Date  . Colon surgery    . Hernia repair    . Wrist fracture surgery Right     Current Outpatient Rx  Name  Route  Sig  Dispense  Refill  . amLODipine (NORVASC) 10 MG tablet   Oral   Take 1 tablet (10 mg total) by mouth daily.   30 tablet   11   . canagliflozin (INVOKANA) 300 MG TABS tablet   Oral   Take 1 tablet (300 mg total) by mouth daily before breakfast.   30 tablet   11   . carvedilol (COREG) 25 MG tablet   Oral   Take 1 tablet (25 mg total) by mouth 2 (two) times daily with a meal.   60 tablet   11   . cefUROXime (CEFTIN) 250 MG tablet   Oral   Take 1 tablet (250 mg total) by mouth 2 (two) times daily with a meal.   14 tablet   0   . cefUROXime (CEFTIN) 250 MG tablet   Oral   Take 1 tablet (250 mg total) by mouth 2 (two) times daily with a meal.   14 tablet   0   . enalapril (VASOTEC) 20 MG tablet   Oral   Take 1 tablet (20 mg total) by mouth 2 (two) times daily.   60 tablet   11   . fluconazole (DIFLUCAN) 150 MG tablet   Oral   Take 1 tablet (150 mg total) by mouth once.   1 tablet   1   .  furosemide (LASIX) 40 MG tablet   Oral   Take 2 tablets (80 mg total) by mouth daily.   60 tablet   11   . glucose blood (RELION GLUCOSE TEST STRIPS) test strip      Use as instructed   100 each   12   . metFORMIN (GLUCOPHAGE) 1000 MG tablet   Oral   Take 1 tablet (1,000 mg total) by mouth 2 (two) times daily with a meal.   180 tablet   3   . ReliOn Ultra Thin Lancets MISC   Does not apply   1 each by Does not apply route daily.   100 each   11   . traMADol (ULTRAM) 50 MG tablet      Limit one half to one tab by mouth per day or twice per day if tolerated   50 tablet   0     Allergies Prednisone  Family History  Problem Relation Age of Onset  . Diabetes Mother   . Cancer Father   . Mental illness Son     Social History Social History  Substance  Use Topics  . Smoking status: Never Smoker   . Smokeless tobacco: None  . Alcohol Use: No    Review of Systems  Constitutional: Negative for fever. Eyes: Negative for visual changes. ENT: Negative for sore throat. Cardiovascular: Negative for chest pain. Respiratory: Negative for cough. Gastrointestinal: Negative for abdominal pain, vomiting and diarrhea. Genitourinary: Negative for dysuria. Musculoskeletal: Negative for back pain. Skin: Negative for rash. Neurological: Negative for headache. 10 point Review of Systems otherwise negative ____________________________________________   PHYSICAL EXAM:  VITAL SIGNS: ED Triage Vitals  Enc Vitals Group     BP 03/17/16 1237 106/59 mmHg     Pulse Rate 03/17/16 1237 73     Resp 03/17/16 1237 18     Temp 03/17/16 1237 97.9 F (36.6 C)     Temp Source 03/17/16 1237 Oral     SpO2 03/17/16 1237 98 %     Weight 03/17/16 1237 240 lb (108.863 kg)     Height 03/17/16 1237 5\' 2"  (1.575 m)     Head Cir --      Peak Flow --      Pain Score 03/17/16 1237 5     Pain Loc --      Pain Edu? --      Excl. in [place]? --      Constitutional: Alert and oriented. Well appearing and in no distress. HEENT   Head: Normocephalic and atraumatic.      Eyes: Conjunctivae are normal. PERRL. Normal extraocular movements.      Ears:         Nose: No congestion/rhinnorhea.   Mouth/Throat: Mucous membranes are moist.   Neck: No stridor. Cardiovascular/Chest: Normal rate, regular rhythm.  No murmurs, rubs, or gallops. Respiratory: Normal respiratory effort without tachypnea nor retractions. Breath sounds are clear and equal bilaterally. No wheezes/rales/rhonchi. Gastrointestinal: Soft. No distention, no guarding, no rebound. Nontender.    Genitourinary/rectal:Deferred Musculoskeletal: Nontender with normal range of motion in all extremities. No joint effusions.  No lower extremity or calf tenderness.  No edema. Neurologic:  Normal speech and  language. No gross or focal neurologic deficits are appreciated. Skin:  Skin is warm, dry and intact. No rash noted. Psychiatric: Mood and affect are normal. Speech and behavior are normal. Patient exhibits appropriate insight and judgment.  ____________________________________________   EKG I, Lisa Roca, MD, the attending physician have  personally viewed and interpreted all ECGs.  77 bpm. Normal sinus rhythm. Left axis deviation. Left bundle branch block. Similar to prior EKGs. ____________________________________________  LABS (pertinent positives/negatives)  Labs Reviewed  BASIC METABOLIC PANEL - Abnormal; Notable for the following:    Sodium 134 (*)    Chloride 97 (*)    Glucose, Bld 215 (*)    BUN 34 (*)    Creatinine, Ser 1.25 (*)    GFR calc non Af Amer 46 (*)    GFR calc Af Amer 53 (*)    All other components within normal limits  CBC - Abnormal; Notable for the following:    WBC 14.0 (*)    RDW 15.3 (*)    All other components within normal limits  TROPONIN I    ____________________________________________  RADIOLOGY All Xrays were viewed by me. Imaging interpreted by Radiologist.  Chest two-view: No radial graphic evidence of acute cardio pulmonary disease. Aortic atherosclerosis __________________________________________  PROCEDURES  Procedure(s) performed: None  Critical Care performed: None  ____________________________________________   ED COURSE / ASSESSMENT AND PLAN  Pertinent labs & imaging results that were available during my care of the patient were reviewed by me and considered in my medical decision making (see chart for details).   She is asymptomatic now with no fever or history of fever, and stable vital signs with 02 sat 100% Room air.  Walking no less than 98% without symptoms here.  Chest x-ray is clear, her lungs are clear clinically.  White blood cell count is a little elevated, uncertain etiology. I discussed this with the  patient. She's not having symptoms of wheezing or pneumonia.  Symptoms have been ongoing for the past 2 days and her EKG is unchanged from prior and her troponin is negative. I am asking her to follow up with her cardiologist for possibility of stress testing given some exertional dyspnea which could possibly be cardiac in etiology. Without chest pain or passing out, any symptomatic now, I don't think that she needs emergency cardiology evaluation or admission. We have discussed return precautions with respect to this.  With respect to the possibly of pulmonary embolism, I think is unlikely. She has no hypoxia. She has no pleuritic chest discomfort. We discussed if she develops any pleuritic chest pain, lightheadedness or dizziness or persistence of shortness of breath, she should only come back for reevaluation.  Her BUN/creatinine are little elevated today. She states that she didn't eat and drink much yesterday because she wasn't feeling really well. She also took her Lasix today. I've asked her to orally rehydrate, she rather do this then do an IV.    CONSULTATIONS:   None   Patient / Family / Caregiver informed of clinical course, medical decision-making process, and agree with plan.   I discussed return precautions, follow-up instructions, and discharged instructions with patient and/or family.   ___________________________________________   FINAL CLINICAL IMPRESSION(S) / ED DIAGNOSES   Final diagnoses:  Acute renal failure, unspecified acute renal failure type ([place])  Shortness of breath              Note: This dictation was prepared with Dragon dictation. Any transcriptional errors that result from this process are unintentional   Lisa Roca, MD 03/17/16 1616

## 2016-03-25 DIAGNOSIS — I10 Essential (primary) hypertension: Secondary | ICD-10-CM | POA: Insufficient documentation

## 2016-03-25 DIAGNOSIS — I447 Left bundle-branch block, unspecified: Secondary | ICD-10-CM | POA: Insufficient documentation

## 2016-03-25 DIAGNOSIS — E782 Mixed hyperlipidemia: Secondary | ICD-10-CM | POA: Insufficient documentation

## 2016-03-25 DIAGNOSIS — R600 Localized edema: Secondary | ICD-10-CM | POA: Insufficient documentation

## 2016-04-01 ENCOUNTER — Ambulatory Visit: Payer: BLUE CROSS/BLUE SHIELD | Attending: Pain Medicine | Admitting: Pain Medicine

## 2016-04-01 ENCOUNTER — Ambulatory Visit: Payer: BLUE CROSS/BLUE SHIELD | Admitting: Pain Medicine

## 2016-04-01 ENCOUNTER — Encounter: Payer: Self-pay | Admitting: Pain Medicine

## 2016-04-01 VITALS — BP 90/55 | HR 77 | Temp 97.8°F | Resp 16 | Ht 62.0 in | Wt 250.0 lb

## 2016-04-01 DIAGNOSIS — M5136 Other intervertebral disc degeneration, lumbar region: Secondary | ICD-10-CM | POA: Insufficient documentation

## 2016-04-01 DIAGNOSIS — M47816 Spondylosis without myelopathy or radiculopathy, lumbar region: Secondary | ICD-10-CM

## 2016-04-01 DIAGNOSIS — M48062 Spinal stenosis, lumbar region with neurogenic claudication: Secondary | ICD-10-CM

## 2016-04-01 DIAGNOSIS — M4806 Spinal stenosis, lumbar region: Secondary | ICD-10-CM | POA: Diagnosis not present

## 2016-04-01 DIAGNOSIS — T148XXA Other injury of unspecified body region, initial encounter: Secondary | ICD-10-CM

## 2016-04-01 DIAGNOSIS — M533 Sacrococcygeal disorders, not elsewhere classified: Secondary | ICD-10-CM

## 2016-04-01 DIAGNOSIS — M545 Low back pain: Secondary | ICD-10-CM | POA: Diagnosis present

## 2016-04-01 DIAGNOSIS — M1990 Unspecified osteoarthritis, unspecified site: Secondary | ICD-10-CM | POA: Diagnosis not present

## 2016-04-01 DIAGNOSIS — E0841 Diabetes mellitus due to underlying condition with diabetic mononeuropathy: Secondary | ICD-10-CM

## 2016-04-01 DIAGNOSIS — E1140 Type 2 diabetes mellitus with diabetic neuropathy, unspecified: Secondary | ICD-10-CM | POA: Insufficient documentation

## 2016-04-01 DIAGNOSIS — M79606 Pain in leg, unspecified: Secondary | ICD-10-CM | POA: Diagnosis present

## 2016-04-01 DIAGNOSIS — M170 Bilateral primary osteoarthritis of knee: Secondary | ICD-10-CM

## 2016-04-01 MED ORDER — TRAMADOL HCL 50 MG PO TABS: ORAL_TABLET | ORAL | 0 refills | Status: DC

## 2016-04-01 NOTE — Progress Notes (Signed)
Safety precautions to be maintained throughout the outpatient stay will include: orient to surroundings, keep bed in low position, maintain call bell within reach at all times, provide assistance with transfer out of bed and ambulation.  

## 2016-04-01 NOTE — Progress Notes (Signed)
The patient is a 60 year old female who returns to pain management for further evaluation and treatment of pain involving the lumbar and lower extremity region. The patient has had significant improvement of pain involving the lumbar region status post lumbar facet, medial branch nerve, blocks. The patient admits to weakness of the lower extremities with prolonged standing and walking. The patient states that she can stand for a brief while at times and has the onset of pain of the lower back progressing to weakness of the lower extremities. We discussed patient's condition. Patient continues tramadol at this time and has minimal use of the tramadol since undergoing lumbar facet, medial branch nerve, blocks. We discussed patient's condition and we will proceed with lumbar epidural steroid injection at time return appointment in attempt to decrease severity of symptoms, minimize progression of symptoms, and avoid the need for more involved treatment. The patient stated that the lower extremity weakness is quite bothersome. We reviewed MRI findings and have discussed neurosurgical evaluation as well and we will proceed with number epidural steroid injection at time return appointment as discussed. All agreed to suggested treatment plan.    Physical examination  There was tenderness to palpation of the splenius capitis and occipitalis regions palpation of these regions reproduced pain of mild degree with mild tenderness of the acromioclavicular and glenohumeral joint region. The patient appeared to be with bilaterally equal grip strength without significant increase of pain with Tinel and Phalen's maneuver. The patient appeared to be with unremarkable Spurling's maneuver. Palpation over the thoracic region was attends to palpation of moderate degree in the lower thoracic region without crepitus of the thoracic region noted. Palpation over the lumbar region was attends to palpation with lateral bending  rotation extension and palpation over the lumbar facets reproducing mild to moderate discomfort. Straight leg raising was tolerates approximately 30 without a definite increase of pain with dorsiflexion noted. DTRs appeared to be trace at the knees. EHL strength was slightly decreased. No definite sensory deficit of dermatomal dystrophy detected. Palpation over the PSIS and PII S regions reproduce moderate discomfor with mild tenderness of the greater trochanteric region iliotibial band region. The knees were attends to palpation without significant increased pain with range of motion maneuvers of the knees noted. There was crepitus of the knees noted. There was also negative anterior and posterior drawer signs noted. Abdomen was nontender with no costovertebral tenderness noted       Assessment    Degenerative disc disease lumbar spine  Lumbar stenosis with neurogenic claudication  Lumbar facet syndrome  Sacroiliac joint dysfunction  Diabetes mellitus with diabetic neuropathy  Arthritis     PLAN  Continue present medication tramadol . CAUTION Medication can cause respiratory depression and cause you to stop breathing, cause excessive sedation, cause confusion and other side effects.  Exercise extreme caution when taking medication and call EMS or go to the Emergency Department immediately if you develop any of these symptoms  Lumbar epidural steroid injection to be performed at time of return appointment  F/U PCP A Krebs for evaliation of  BP and general medical  condition  F/U surgical evaluation . Surgical evaluation has been addressed  Ask the nurses and secretary the date of your OPEN lumbar MRI  F/U neurological evaluation. May consider PNCV/EMG studies and other studies pending follow-up evaluations  May consider radiofrequency rhizolysis or intraspinal procedures pending response to present treatment and F/U evaluation   Patient to call [facility] should  patient  have concerns prior to scheduled return appointment

## 2016-04-01 NOTE — Patient Instructions (Addendum)
PLAN  Continue present medication tramadol . CAUTION Medication can cause respiratory depression and cause you to stop breathing, cause excessive sedation, cause confusion and other side effects.  Exercise extreme caution when taking medication and call EMS or go to the Emergency Department immediately if you develop any of these symptoms  Lumbar epidural steroid injection to be performed at time of return appointment  F/U PCP A Krebs for evaliation of  BP and general medical  condition  F/U surgical evaluation. May consider pending follow-up evaluations  Ask the nurses and secretary the date of your OPEN lumbar MRI  F/U neurological evaluation. May consider pending follow-up evaluations  May consider radiofrequency rhizolysis or intraspinal procedures pending response to present treatment and F/U evaluation   Patient to call [facility] should patient have concerns prior to scheduled return appointment.GENERAL RISKS AND COMPLICATIONS  What are the risk, side effects and possible complications? Generally speaking, most procedures are safe.  However, with any procedure there are risks, side effects, and the possibility of complications.  The risks and complications are dependent upon the sites that are lesioned, or the type of nerve block to be performed.  The closer the procedure is to the spine, the more serious the risks are.  Great care is taken when placing the radio frequency needles, block needles or lesioning probes, but sometimes complications can occur. 1. Infection: Any time there is an injection through the skin, there is a risk of infection.  This is why sterile conditions are used for these blocks.  There are four possible types of infection. 1. Localized skin infection. 2. Central Nervous System Infection-This can be in the form of Meningitis, which can be deadly. 3. Epidural Infections-This can be in the form of an epidural abscess, which can cause pressure inside of  the spine, causing compression of the spinal cord with subsequent paralysis. This would require an emergency surgery to decompress, and there are no guarantees that the patient would recover from the paralysis. 4. Discitis-This is an infection of the intervertebral discs.  It occurs in about 1% of discography procedures.  It is difficult to treat and it may lead to surgery.        2. Pain: the needles have to go through skin and soft tissues, will cause soreness.       3. Damage to internal structures:  The nerves to be lesioned may be near blood vessels or    other nerves which can be potentially damaged.       4. Bleeding: Bleeding is more common if the patient is taking blood thinners such as  aspirin, Coumadin, Ticiid, Plavix, etc., or if he/she have some genetic predisposition  such as hemophilia. Bleeding into the spinal canal can cause compression of the spinal  cord with subsequent paralysis.  This would require an emergency surgery to  decompress and there are no guarantees that the patient would recover from the  paralysis.       5. Pneumothorax:  Puncturing of a lung is a possibility, every time a needle is introduced in  the area of the chest or upper back.  Pneumothorax refers to free air around the  collapsed lung(s), inside of the thoracic cavity (chest cavity).  Another two possible  complications related to a similar event would include: Hemothorax and Chylothorax.   These are variations of the Pneumothorax, where instead of air around the collapsed  lung(s), you may have blood or chyle, respectively.  6. Spinal headaches: They may occur with any procedures in the area of the spine.       7. Persistent CSF (Cerebro-Spinal Fluid) leakage: This is a rare problem, but may occur  with prolonged intrathecal or epidural catheters either due to the formation of a fistulous  track or a dural tear.       8. Nerve damage: By working so close to the spinal cord, there is always a possibility of   nerve damage, which could be as serious as a permanent spinal cord injury with  paralysis.       9. Death:  Although rare, severe deadly allergic reactions known as "Anaphylactic  reaction" can occur to any of the medications used.      10. Worsening of the symptoms:  We can always make thing worse.  What are the chances of something like this happening? Chances of any of this occuring are extremely low.  By statistics, you have more of a chance of getting killed in a motor vehicle accident: while driving to the hospital than any of the above occurring .  Nevertheless, you should be aware that they are possibilities.  In general, it is similar to taking a shower.  Everybody knows that you can slip, hit your head and get killed.  Does that mean that you should not shower again?  Nevertheless always keep in mind that statistics do not mean anything if you happen to be on the wrong side of them.  Even if a procedure has a 1 (one) in a 1,000,000 (million) chance of going wrong, it you happen to be that one..Also, keep in mind that by statistics, you have more of a chance of having something go wrong when taking medications.  Who should not have this procedure? If you are on a blood thinning medication (e.g. Coumadin, Plavix, see list of "Blood Thinners"), or if you have an active infection going on, you should not have the procedure.  If you are taking any blood thinners, please inform your physician.  How should I prepare for this procedure?  Do not eat or drink anything at least six hours prior to the procedure.  Bring a driver with you .  It cannot be a taxi.  Come accompanied by an adult that can drive you back, and that is strong enough to help you if your legs get weak or numb from the local anesthetic.  Take all of your medicines the morning of the procedure with just enough water to swallow them.  If you have diabetes, make sure that you are scheduled to have your procedure done first thing  in the morning, whenever possible.  If you have diabetes, take only half of your insulin dose and notify our nurse that you have done so as soon as you arrive at the clinic.  If you are diabetic, but only take blood sugar pills (oral hypoglycemic), then do not take them on the morning of your procedure.  You may take them after you have had the procedure.  Do not take aspirin or any aspirin-containing medications, at least eleven (11) days prior to the procedure.  They may prolong bleeding.  Wear loose fitting clothing that may be easy to take off and that you would not mind if it got stained with Betadine or blood.  Do not wear any jewelry or perfume  Remove any nail coloring.  It will interfere with some of our monitoring equipment.  NOTE: Remember that this is  not meant to be interpreted as a complete list of all possible complications.  Unforeseen problems may occur.  BLOOD THINNERS The following drugs contain aspirin or other products, which can cause increased bleeding during surgery and should not be taken for 2 weeks prior to and 1 week after surgery.  If you should need take something for relief of minor pain, you may take acetaminophen which is found in Tylenol,m Datril, Anacin-3 and Panadol. It is not blood thinner. The products listed below are.  Do not take any of the products listed below in addition to any listed on your instruction sheet.  A.P.C or A.P.C with Codeine Codeine Phosphate Capsules #3 Ibuprofen Ridaura  ABC compound Congesprin Imuran rimadil  Advil Cope Indocin Robaxisal  Alka-Seltzer Effervescent Pain Reliever and Antacid Coricidin or Coricidin-D  Indomethacin Rufen  Alka-Seltzer plus Cold Medicine Cosprin Ketoprofen S-A-C Tablets  Anacin Analgesic Tablets or Capsules Coumadin Korlgesic Salflex  Anacin Extra Strength Analgesic tablets or capsules CP-2 Tablets Lanoril Salicylate  Anaprox Cuprimine Capsules Levenox Salocol  Anexsia-D Dalteparin Magan Salsalate   Anodynos Darvon compound Magnesium Salicylate Sine-off  Ansaid Dasin Capsules Magsal Sodium Salicylate  Anturane Depen Capsules Marnal Soma  APF Arthritis pain formula Dewitt's Pills Measurin Stanback  Argesic Dia-Gesic Meclofenamic Sulfinpyrazone  Arthritis Bayer Timed Release Aspirin Diclofenac Meclomen Sulindac  Arthritis pain formula Anacin Dicumarol Medipren Supac  Analgesic (Safety coated) Arthralgen Diffunasal Mefanamic Suprofen  Arthritis Strength Bufferin Dihydrocodeine Mepro Compound Suprol  Arthropan liquid Dopirydamole Methcarbomol with Aspirin Synalgos  ASA tablets/Enseals Disalcid Micrainin Tagament  Ascriptin Doan's Midol Talwin  Ascriptin A/D Dolene Mobidin Tanderil  Ascriptin Extra Strength Dolobid Moblgesic Ticlid  Ascriptin with Codeine Doloprin or Doloprin with Codeine Momentum Tolectin  Asperbuf Duoprin Mono-gesic Trendar  Aspergum Duradyne Motrin or Motrin IB Triminicin  Aspirin plain, buffered or enteric coated Durasal Myochrisine Trigesic  Aspirin Suppositories Easprin Nalfon Trillsate  Aspirin with Codeine Ecotrin Regular or Extra Strength Naprosyn Uracel  Atromid-S Efficin Naproxen Ursinus  Auranofin Capsules Elmiron Neocylate Vanquish  Axotal Emagrin Norgesic Verin  Azathioprine Empirin or Empirin with Codeine Normiflo Vitamin E  Azolid Emprazil Nuprin Voltaren  Bayer Aspirin plain, buffered or children's or timed BC Tablets or powders Encaprin Orgaran Warfarin Sodium  Buff-a-Comp Enoxaparin Orudis Zorpin  Buff-a-Comp with Codeine Equegesic Os-Cal-Gesic   Buffaprin Excedrin plain, buffered or Extra Strength Oxalid   Bufferin Arthritis Strength Feldene Oxphenbutazone   Bufferin plain or Extra Strength Feldene Capsules Oxycodone with Aspirin   Bufferin with Codeine Fenoprofen Fenoprofen Pabalate or Pabalate-SF   Buffets II Flogesic Panagesic   Buffinol plain or Extra Strength Florinal or Florinal with Codeine Panwarfarin   Buf-Tabs Flurbiprofen  Penicillamine   Butalbital Compound Four-way cold tablets Penicillin   Butazolidin Fragmin Pepto-Bismol   Carbenicillin Geminisyn Percodan   Carna Arthritis Reliever Geopen Persantine   Carprofen Gold's salt Persistin   Chloramphenicol Goody's Phenylbutazone   Chloromycetin Haltrain Piroxlcam   Clmetidine heparin Plaquenil   Cllnoril Hyco-pap Ponstel   Clofibrate Hydroxy chloroquine Propoxyphen         Before stopping any of these medications, be sure to consult the physician who ordered them.  Some, such as Coumadin (Warfarin) are ordered to prevent or treat serious conditions such as "deep thrombosis", "pumonary embolisms", and other heart problems.  The amount of time that you may need off of the medication may also vary with the medication and the reason for which you were taking it.  If you are taking any of these medications, please make sure  you notify your pain physician before you undergo any procedures.         Epidural Steroid Injection An epidural steroid injection is given to relieve pain in your neck, back, or legs that is caused by the irritation or swelling of a nerve root. This procedure involves injecting a steroid and numbing medicine (anesthetic) into the epidural space. The epidural space is the space between the outer covering of your spinal cord and the bones that form your backbone (vertebra).  LET [facility] CARE PROVIDER KNOW ABOUT:   Any allergies you have.  All medicines you are taking, including vitamins, herbs, eye drops, creams, and over-the-counter medicines such as aspirin.  Previous problems you or members of your family have had with the use of anesthetics.  Any blood disorders or blood clotting disorders you have.  Previous surgeries you have had.  Medical conditions you have. RISKS AND COMPLICATIONS Generally, this is a safe procedure. However, as with any procedure, complications can occur. Possible complications of epidural steroid  injection include:  Headache.  Bleeding.  Infection.  Allergic reaction to the medicines.  Damage to your nerves. The response to this procedure depends on the underlying cause of the pain and its duration. People who have long-term (chronic) pain are less likely to benefit from epidural steroids than are those people whose pain comes on strong and suddenly. BEFORE THE PROCEDURE   Ask your health care provider about changing or stopping your regular medicines. You may be advised to stop taking blood-thinning medicines a few days before the procedure.  You may be given medicines to reduce anxiety.  Arrange for someone to take you home after the procedure. PROCEDURE   You will remain awake during the procedure. You may receive medicine to make you relaxed.  You will be asked to lie on your stomach.  The injection site will be cleaned.  The injection site will be numbed with a medicine (local anesthetic).  A needle will be injected through your skin into the epidural space.  Your health care provider will use an X-ray machine to ensure that the steroid is delivered closest to the affected nerve. You may have minimal discomfort at this time.  Once the needle is in the right position, the local anesthetic and the steroid will be injected into the epidural space.  The needle will then be removed and a bandage will be applied to the injection site. AFTER THE PROCEDURE   You may be monitored for a short time before you go home.  You may feel weakness or numbness in your arm or leg, which disappears within hours.  You may be allowed to eat, drink, and take your regular medicine.  You may have soreness at the site of the injection.   This information is not intended to replace advice given to you by your health care provider. Make sure you discuss any questions you have with your health care provider.   Document Released: 11/25/2007 Document Revised: 04/20/2013 Document  Reviewed: 02/04/2013 Elsevier Interactive Patient Education [employer].

## 2016-04-14 ENCOUNTER — Ambulatory Visit: Payer: BLUE CROSS/BLUE SHIELD | Attending: Pain Medicine | Admitting: Pain Medicine

## 2016-04-14 ENCOUNTER — Encounter: Payer: Self-pay | Admitting: Pain Medicine

## 2016-04-14 VITALS — BP 119/67 | HR 73 | Temp 97.7°F | Resp 12 | Ht 62.0 in | Wt 250.0 lb

## 2016-04-14 DIAGNOSIS — M533 Sacrococcygeal disorders, not elsewhere classified: Secondary | ICD-10-CM

## 2016-04-14 DIAGNOSIS — M1990 Unspecified osteoarthritis, unspecified site: Secondary | ICD-10-CM

## 2016-04-14 DIAGNOSIS — M48062 Spinal stenosis, lumbar region with neurogenic claudication: Secondary | ICD-10-CM

## 2016-04-14 DIAGNOSIS — M47816 Spondylosis without myelopathy or radiculopathy, lumbar region: Secondary | ICD-10-CM

## 2016-04-14 DIAGNOSIS — M545 Low back pain: Secondary | ICD-10-CM | POA: Diagnosis present

## 2016-04-14 DIAGNOSIS — M170 Bilateral primary osteoarthritis of knee: Secondary | ICD-10-CM

## 2016-04-14 DIAGNOSIS — M5136 Other intervertebral disc degeneration, lumbar region: Secondary | ICD-10-CM | POA: Diagnosis not present

## 2016-04-14 DIAGNOSIS — M79606 Pain in leg, unspecified: Secondary | ICD-10-CM | POA: Diagnosis present

## 2016-04-14 DIAGNOSIS — E0841 Diabetes mellitus due to underlying condition with diabetic mononeuropathy: Secondary | ICD-10-CM

## 2016-04-14 MED ORDER — CEFAZOLIN IN D5W 1 GM/50ML IV SOLN: 1.0000 g | Freq: Once | INTRAVENOUS | Status: DC

## 2016-04-14 MED ORDER — LIDOCAINE HCL (PF) 1 % IJ SOLN
10.0000 mL | Freq: Once | INTRAMUSCULAR | Status: DC
Start: 2016-04-14 — End: 2020-08-23

## 2016-04-14 MED ORDER — TRIAMCINOLONE ACETONIDE 40 MG/ML IJ SUSP
INTRAMUSCULAR | Status: AC
  Administered 2016-04-14: 11:00:00
  Filled 2016-04-14: qty 1

## 2016-04-14 MED ORDER — SODIUM CHLORIDE 0.9% FLUSH: 20.0000 mL | Freq: Once | INTRAVENOUS | Status: DC

## 2016-04-14 MED ORDER — MIDAZOLAM HCL 5 MG/5ML IJ SOLN
INTRAMUSCULAR | Status: AC
  Filled 2016-04-14: qty 5

## 2016-04-14 MED ORDER — FENTANYL CITRATE (PF) 100 MCG/2ML IJ SOLN
INTRAMUSCULAR | Status: AC
  Filled 2016-04-14: qty 2

## 2016-04-14 MED ORDER — CEFAZOLIN SODIUM 1 G IJ SOLR
INTRAMUSCULAR | Status: AC
  Administered 2016-04-14: 11:00:00
  Filled 2016-04-14: qty 10

## 2016-04-14 MED ORDER — MIDAZOLAM HCL 5 MG/5ML IJ SOLN: 5.0000 mg | Freq: Once | INTRAMUSCULAR | Status: DC

## 2016-04-14 MED ORDER — CEFUROXIME AXETIL 250 MG PO TABS: 250.0000 mg | ORAL_TABLET | Freq: Two times a day (BID) | ORAL | 0 refills | Status: DC

## 2016-04-14 MED ORDER — BUPIVACAINE HCL (PF) 0.25 % IJ SOLN
INTRAMUSCULAR | Status: AC
  Administered 2016-04-14: 11:00:00
  Filled 2016-04-14: qty 30

## 2016-04-14 MED ORDER — ORPHENADRINE CITRATE 30 MG/ML IJ SOLN
INTRAMUSCULAR | Status: AC
  Administered 2016-04-14: 11:00:00
  Filled 2016-04-14: qty 2

## 2016-04-14 MED ORDER — LACTATED RINGERS IV SOLN
1000.0000 mL | INTRAVENOUS | Status: DC
Start: 2016-04-14 — End: 2019-11-11

## 2016-04-14 MED ORDER — BUPIVACAINE HCL (PF) 0.25 % IJ SOLN: 30.0000 mL | Freq: Once | INTRAMUSCULAR | Status: DC

## 2016-04-14 MED ORDER — LIDOCAINE HCL (PF) 1 % IJ SOLN
INTRAMUSCULAR | Status: AC
  Administered 2016-04-14: 11:00:00
  Filled 2016-04-14: qty 5

## 2016-04-14 MED ORDER — FENTANYL CITRATE (PF) 100 MCG/2ML IJ SOLN: 100.0000 ug | Freq: Once | INTRAMUSCULAR | Status: DC

## 2016-04-14 MED ORDER — ORPHENADRINE CITRATE 30 MG/ML IJ SOLN: 60.0000 mg | Freq: Once | INTRAMUSCULAR | Status: DC

## 2016-04-14 MED ORDER — TRIAMCINOLONE ACETONIDE 40 MG/ML IJ SUSP: 40.0000 mg | Freq: Once | INTRAMUSCULAR | Status: DC

## 2016-04-14 MED ORDER — SODIUM CHLORIDE 0.9 % IJ SOLN
INTRAMUSCULAR | Status: AC
  Administered 2016-04-14: 11:00:00
  Filled 2016-04-14: qty 20

## 2016-04-14 NOTE — Progress Notes (Signed)
    PROCEDURE PERFORMED: Lumbar epidural steroid injection   NOTE: The patient is a 60 y.o. female who returns to [place] for further evaluation and treatment of pain involving the lumbar and lower extremity region.. . There is concern regarding degenerative disc disease of the lumbar spine contributing to patient's symptomatology The risks, benefits, and expectations of the procedure have been discussed and explained to the patient who was understanding and in agreement with suggested treatment plan. We will proceed with lumbar epidural steroid injection as discussed and as explained to the patient who is willing to proceed with procedure as planned.   DESCRIPTION OF PROCEDURE: Lumbar epidural steroid injection with IV Versed, IV fentanyl conscious sedation, EKG, blood pressure, pulse, capnography, and pulse oximetry monitoring. The procedure was performed with the patient in the prone position under fluoroscopic guidance. A local anesthetic skin wheal of 1.5% plain lidocaine was accomplished at proposed entry site. An 18-gauge Tuohy epidural needle was inserted at the L 4 vertebral body level left of the midline via loss-of-resistance technique with negative heme and negative CSF return. A total of 4 mL of Preservative-Free normal saline with 40 mg of Kenalog injected incrementally via epidurally placed needle. Needle was removed.   Myoneural block injections of the lumbar paraspinal musculature region Following Betadine prep of proposed entry site a 22-gauge needle was inserted in the lumbar paraspinal musculature region and following negative aspiration 2 cc of 0.25% bupivacaine with Norflex was injected for myoneural block injection of the lumbar paraspinal musculature region 4    A total of 40 mg of Kenalog was utilized for the procedure.   The patient tolerated the injection well.    PLAN:   1. Medications: We will continue presently prescribed medications. 2. Will  consider modification of treatment regimen pending response to treatment rendered on today's visit and follow-up evaluation. 3. The patient is to follow-up with primary care physician Dr.Amy Krebs regarding blood pressure and general medical condition status post lumbar epidural steroid injection performed on today's visit. 4. Surgical evaluation.Has been addressed  5. Neurological evaluation.May consider [facility] EMG studies and other studies  6. The patient may be a candidate for radiofrequency procedures, implantation device, and other treatment pending response to treatment and follow-up evaluation. 7. The patient has been advised to adhere to proper body mechanics and avoid activities which appear to aggravate condition. 8. The patient has been advised to call the Pain Management Center prior to scheduled return appointment should there be significant change in condition or should there be sign  The patient is understanding and agrees with the suggested  treatment plan

## 2016-04-14 NOTE — Progress Notes (Signed)
Safety precautions to be maintained throughout the outpatient stay will include: orient to surroundings, keep bed in low position, maintain call bell within reach at all times, provide assistance with transfer out of bed and ambulation.  

## 2016-04-14 NOTE — Patient Instructions (Addendum)
PLAN  Continue present medications. Please obtain Ceftin antibiotic today and begin taking Ceftin antibiotic today as prescribed   F/U PCP A Krebs for evaluation of  BP diabetes mellitus  and general medical  condition  F/U surgical evaluation. May consider pending follow-up evaluations  Ask the nurses and secretary the date of your OPEN lumbar MRI  F/U neurological evaluation. May consider  PNCV/EMG studies and other studies pending follow-up evaluations  May consider radiofrequency rhizolysis or intraspinal procedures pending response to present treatment and F/U evaluation   Patient to call Pain Management Center should patient have concerns prior to scheduled return appointment.Epidural Steroid Injection Patient Information  Description: The epidural space surrounds the nerves as they exit the spinal cord.  In some patients, the nerves can be compressed and inflamed by a bulging disc or a tight spinal canal (spinal stenosis).  By injecting steroids into the epidural space, we can bring irritated nerves into direct contact with a potentially helpful medication.  These steroids act directly on the irritated nerves and can reduce swelling and inflammation which often leads to decreased pain.  Epidural steroids may be injected anywhere along the spine and from the neck to the low back depending upon the location of your pain.   After numbing the skin with local anesthetic (like Novocaine), a small needle is passed into the epidural space slowly.  You may experience a sensation of pressure while this is being done.  The entire block usually last less than 10 minutes.  Conditions which may be treated by epidural steroids:   Low back and leg pain  Neck and arm pain  Spinal stenosis  Post-laminectomy syndrome  Herpes zoster (shingles) pain  Pain from compression fractures  Preparation for the injection:  1. Do not eat any solid food or dairy products within 8 hours of your  appointment.  2. You may drink clear liquids up to 3 hours before appointment.  Clear liquids include water, black coffee, juice or soda.  No milk or cream please. 3. You may take your regular medication, including pain medications, with a sip of water before your appointment  Diabetics should hold regular insulin (if taken separately) and take 1/2 normal NPH dos the morning of the procedure.  Carry some sugar containing items with you to your appointment. 4. A driver must accompany you and be prepared to drive you home after your procedure.  5. Bring all your current medications with your. 6. An IV may be inserted and sedation may be given at the discretion of the physician.   7. A blood pressure cuff, EKG and other monitors will often be applied during the procedure.  Some patients may need to have extra oxygen administered for a short period. 8. You will be asked to provide medical information, including your allergies, prior to the procedure.  We must know immediately if you are taking blood thinners (like Coumadin/Warfarin)  Or if you are allergic to IV iodine contrast (dye). We must know if you could possible be pregnant.  Possible side-effects:  Bleeding from needle site  Infection (rare, may require surgery)  Nerve injury (rare)  Numbness & tingling (temporary)  Difficulty urinating (rare, temporary)  Spinal headache ( a headache worse with upright posture)  Light -headedness (temporary)  Pain at injection site (several days)  Decreased blood pressure (temporary)  Weakness in arm/leg (temporary)  Pressure sensation in back/neck (temporary)  Call if you experience:  Fever/chills associated with headache or increased back/neck pain.  Headache  worsened by an upright position.  New onset weakness or numbness of an extremity below the injection site  Hives or difficulty breathing (go to the emergency room)  Inflammation or drainage at the infection site  Severe  back/neck pain  Any new symptoms which are concerning to you  Please note:  Although the local anesthetic injected can often make your back or neck feel good for several hours after the injection, the pain will likely return.  It takes 3-7 days for steroids to work in the epidural space.  You may not notice any pain relief for at least that one week.  If effective, we will often do a series of three injections spaced 3-6 weeks apart to maximally decrease your pain.  After the initial series, we generally will wait several months before considering a repeat injection of the same type.  If you have any questions, please call [phone] [facility] Inst[facility]  General Discharge Instructions :  If you need to reach your doctor call: Monday-Friday 8:00 am - 4:00 pm at [phone] or toll free [phone].  After clinic hours [phone] to have operator reach doctor.  Bring all of your medication bottles to all your appointments in the pain clinic.  To cancel or reschedule your appointment with Pain Management please remember to call 24 hours in advance to avoid a fee.  Refer to the educational materials which you have been given on: General Risks, I had my Procedure. Discharge Instructions, Post Sedation.  Post Procedure Instructions:  The drugs you were given will stay in your system until tomorrow, so for the next 24 hours you should not drive, make any legal decisions or drink any alcoholic beverages.  You may eat anything you prefer, but it is better to start with liquids then soups and crackers, and gradually work up to solid foods.  Please notify your doctor immediately if you have any unusual bleeding, trouble breathing or pain that is not related to your normal pain.  Depending on the type of procedure that was done, some parts of your body may feel week and/or numb.  This usually clears up by tonight or the next  day.  Walk with the use of an assistive device or accompanied by an adult for the 24 hours.  You may use ice on the affected area for the first 24 hours.  Put ice in a Ziploc bag and cover with a towel and place against area 15 minutes on 15 minutes off.  You may switch to heat after 24 hours.GENERAL RISKS AND COMPLICATIONS  What are the risk, side effects and possible complications? Generally speaking, most procedures are safe.  However, with any procedure there are risks, side effects, and the possibility of complications.  The risks and complications are dependent upon the sites that are lesioned, or the type of nerve block to be performed.  The closer the procedure is to the spine, the more serious the risks are.  Great care is taken when placing the radio frequency needles, block needles or lesioning probes, but sometimes complications can occur. 1. Infection: Any time there is an injection through the skin, there is a risk of infection.  This is why sterile conditions are used for these blocks.  There are four possible types of infection. 1. Localized skin infection. 2. Central Nervous System Infection-This can be in the form of Meningitis, which can be deadly. 3. Epidural Infections-This can be in the form of an  epidural abscess, which can cause pressure inside of the spine, causing compression of the spinal cord with subsequent paralysis. This would require an emergency surgery to decompress, and there are no guarantees that the patient would recover from the paralysis. 4. Discitis-This is an infection of the intervertebral discs.  It occurs in about 1% of discography procedures.  It is difficult to treat and it may lead to surgery.        2. Pain: the needles have to go through skin and soft tissues, will cause soreness.       3. Damage to internal structures:  The nerves to be lesioned may be near blood vessels or    other nerves which can be potentially damaged.       4. Bleeding:  Bleeding is more common if the patient is taking blood thinners such as  aspirin, Coumadin, Ticiid, Plavix, etc., or if he/she have some genetic predisposition  such as hemophilia. Bleeding into the spinal canal can cause compression of the spinal  cord with subsequent paralysis.  This would require an emergency surgery to  decompress and there are no guarantees that the patient would recover from the  paralysis.       5. Pneumothorax:  Puncturing of a lung is a possibility, every time a needle is introduced in  the area of the chest or upper back.  Pneumothorax refers to free air around the  collapsed lung(s), inside of the thoracic cavity (chest cavity).  Another two possible  complications related to a similar event would include: Hemothorax and Chylothorax.   These are variations of the Pneumothorax, where instead of air around the collapsed  lung(s), you may have blood or chyle, respectively.       6. Spinal headaches: They may occur with any procedures in the area of the spine.       7. Persistent CSF (Cerebro-Spinal Fluid) leakage: This is a rare problem, but may occur  with prolonged intrathecal or epidural catheters either due to the formation of a fistulous  track or a dural tear.       8. Nerve damage: By working so close to the spinal cord, there is always a possibility of  nerve damage, which could be as serious as a permanent spinal cord injury with  paralysis.       9. Death:  Although rare, severe deadly allergic reactions known as "Anaphylactic  reaction" can occur to any of the medications used.      10. Worsening of the symptoms:  We can always make thing worse.  What are the chances of something like this happening? Chances of any of this occuring are extremely low.  By statistics, you have more of a chance of getting killed in a motor vehicle accident: while driving to the hospital than any of the above occurring .  Nevertheless, you should be aware that they are possibilities.  In  general, it is similar to taking a shower.  Everybody knows that you can slip, hit your head and get killed.  Does that mean that you should not shower again?  Nevertheless always keep in mind that statistics do not mean anything if you happen to be on the wrong side of them.  Even if a procedure has a 1 (one) in a 1,000,000 (million) chance of going wrong, it you happen to be that one..Also, keep in mind that by statistics, you have more of a chance of having something go wrong when taking medications.  Who should not have this procedure? If you are on a blood thinning medication (e.g. Coumadin, Plavix, see list of "Blood Thinners"), or if you have an active infection going on, you should not have the procedure.  If you are taking any blood thinners, please inform your physician.  How should I prepare for this procedure?  Do not eat or drink anything at least six hours prior to the procedure.  Bring a driver with you .  It cannot be a taxi.  Come accompanied by an adult that can drive you back, and that is strong enough to help you if your legs get weak or numb from the local anesthetic.  Take all of your medicines the morning of the procedure with just enough water to swallow them.  If you have diabetes, make sure that you are scheduled to have your procedure done first thing in the morning, whenever possible.  If you have diabetes, take only half of your insulin dose and notify our nurse that you have done so as soon as you arrive at the clinic.  If you are diabetic, but only take blood sugar pills (oral hypoglycemic), then do not take them on the morning of your procedure.  You may take them after you have had the procedure.  Do not take aspirin or any aspirin-containing medications, at least eleven (11) days prior to the procedure.  They may prolong bleeding.  Wear loose fitting clothing that may be easy to take off and that you would not mind if it got stained with Betadine or  blood.  Do not wear any jewelry or perfume  Remove any nail coloring.  It will interfere with some of our monitoring equipment.  NOTE: Remember that this is not meant to be interpreted as a complete list of all possible complications.  Unforeseen problems may occur.  BLOOD THINNERS The following drugs contain aspirin or other products, which can cause increased bleeding during surgery and should not be taken for 2 weeks prior to and 1 week after surgery.  If you should need take something for relief of minor pain, you may take acetaminophen which is found in Tylenol,m Datril, Anacin-3 and Panadol. It is not blood thinner. The products listed below are.  Do not take any of the products listed below in addition to any listed on your instruction sheet.  A.P.C or A.P.C with Codeine Codeine Phosphate Capsules #3 Ibuprofen Ridaura  ABC compound Congesprin Imuran rimadil  Advil Cope Indocin Robaxisal  Alka-Seltzer Effervescent Pain Reliever and Antacid Coricidin or Coricidin-D  Indomethacin Rufen  Alka-Seltzer plus Cold Medicine Cosprin Ketoprofen S-A-C Tablets  Anacin Analgesic Tablets or Capsules Coumadin Korlgesic Salflex  Anacin Extra Strength Analgesic tablets or capsules CP-2 Tablets Lanoril Salicylate  Anaprox Cuprimine Capsules Levenox Salocol  Anexsia-D Dalteparin Magan Salsalate  Anodynos Darvon compound Magnesium Salicylate Sine-off  Ansaid Dasin Capsules Magsal Sodium Salicylate  Anturane Depen Capsules Marnal Soma  APF Arthritis pain formula Dewitt's Pills Measurin Stanback  Argesic Dia-Gesic Meclofenamic Sulfinpyrazone  Arthritis Bayer Timed Release Aspirin Diclofenac Meclomen Sulindac  Arthritis pain formula Anacin Dicumarol Medipren Supac  Analgesic (Safety coated) Arthralgen Diffunasal Mefanamic Suprofen  Arthritis Strength Bufferin Dihydrocodeine Mepro Compound Suprol  Arthropan liquid Dopirydamole Methcarbomol with Aspirin Synalgos  ASA tablets/Enseals Disalcid Micrainin  Tagament  Ascriptin Doan's Midol Talwin  Ascriptin A/D Dolene Mobidin Tanderil  Ascriptin Extra Strength Dolobid Moblgesic Ticlid  Ascriptin with Codeine Doloprin or Doloprin with Codeine Momentum Tolectin  Asperbuf Duoprin Mono-gesic Trendar  Aspergum Duradyne  Motrin or Motrin IB Triminicin  Aspirin plain, buffered or enteric coated Durasal Myochrisine Trigesic  Aspirin Suppositories Easprin Nalfon Trillsate  Aspirin with Codeine Ecotrin Regular or Extra Strength Naprosyn Uracel  Atromid-S Efficin Naproxen Ursinus  Auranofin Capsules Elmiron Neocylate Vanquish  Axotal Emagrin Norgesic Verin  Azathioprine Empirin or Empirin with Codeine Normiflo Vitamin E  Azolid Emprazil Nuprin Voltaren  Bayer Aspirin plain, buffered or children's or timed BC Tablets or powders Encaprin Orgaran Warfarin Sodium  Buff-a-Comp Enoxaparin Orudis Zorpin  Buff-a-Comp with Codeine Equegesic Os-Cal-Gesic   Buffaprin Excedrin plain, buffered or Extra Strength Oxalid   Bufferin Arthritis Strength Feldene Oxphenbutazone   Bufferin plain or Extra Strength Feldene Capsules Oxycodone with Aspirin   Bufferin with Codeine Fenoprofen Fenoprofen Pabalate or Pabalate-SF   Buffets II Flogesic Panagesic   Buffinol plain or Extra Strength Florinal or Florinal with Codeine Panwarfarin   Buf-Tabs Flurbiprofen Penicillamine   Butalbital Compound Four-way cold tablets Penicillin   Butazolidin Fragmin Pepto-Bismol   Carbenicillin Geminisyn Percodan   Carna Arthritis Reliever Geopen Persantine   Carprofen Gold's salt Persistin   Chloramphenicol Goody's Phenylbutazone   Chloromycetin Haltrain Piroxlcam   Clmetidine heparin Plaquenil   Cllnoril Hyco-pap Ponstel   Clofibrate Hydroxy chloroquine Propoxyphen         Before stopping any of these medications, be sure to consult the physician who ordered them.  Some, such as Coumadin (Warfarin) are ordered to prevent or treat serious conditions such as "deep thrombosis", "pumonary  embolisms", and other heart problems.  The amount of time that you may need off of the medication may also vary with the medication and the reason for which you were taking it.  If you are taking any of these medications, please make sure you notify your pain physician before you undergo any procedures.

## 2016-04-15 ENCOUNTER — Telehealth: Payer: Self-pay | Admitting: *Deleted

## 2016-04-15 NOTE — Telephone Encounter (Signed)
Message left

## 2016-04-17 ENCOUNTER — Ambulatory Visit: Payer: BLUE CROSS/BLUE SHIELD | Admitting: Pain Medicine

## 2016-04-21 ENCOUNTER — Ambulatory Visit: Payer: BLUE CROSS/BLUE SHIELD | Attending: Pain Medicine | Admitting: Pain Medicine

## 2016-04-21 ENCOUNTER — Encounter: Payer: Self-pay | Admitting: Pain Medicine

## 2016-04-21 VITALS — BP 144/63 | HR 72 | Temp 98.2°F | Resp 14 | Ht 62.0 in | Wt 250.0 lb

## 2016-04-21 DIAGNOSIS — M170 Bilateral primary osteoarthritis of knee: Secondary | ICD-10-CM

## 2016-04-21 DIAGNOSIS — M533 Sacrococcygeal disorders, not elsewhere classified: Secondary | ICD-10-CM | POA: Diagnosis not present

## 2016-04-21 DIAGNOSIS — M5136 Other intervertebral disc degeneration, lumbar region: Secondary | ICD-10-CM | POA: Diagnosis not present

## 2016-04-21 DIAGNOSIS — E0841 Diabetes mellitus due to underlying condition with diabetic mononeuropathy: Secondary | ICD-10-CM

## 2016-04-21 DIAGNOSIS — M545 Low back pain: Secondary | ICD-10-CM | POA: Diagnosis present

## 2016-04-21 DIAGNOSIS — M4806 Spinal stenosis, lumbar region: Secondary | ICD-10-CM | POA: Insufficient documentation

## 2016-04-21 DIAGNOSIS — M47816 Spondylosis without myelopathy or radiculopathy, lumbar region: Secondary | ICD-10-CM

## 2016-04-21 DIAGNOSIS — E1140 Type 2 diabetes mellitus with diabetic neuropathy, unspecified: Secondary | ICD-10-CM | POA: Insufficient documentation

## 2016-04-21 DIAGNOSIS — M1990 Unspecified osteoarthritis, unspecified site: Secondary | ICD-10-CM | POA: Diagnosis not present

## 2016-04-21 MED ORDER — TRAMADOL HCL 50 MG PO TABS: ORAL_TABLET | ORAL | 0 refills | Status: DC

## 2016-04-21 NOTE — Patient Instructions (Signed)
PLAN  Continue present medication tramadol . CAUTION Medication can cause respiratory depression and cause you to stop breathing, cause excessive sedation, cause confusion and other side effects.  Exercise extreme caution when taking medication and call EMS or go to the Emergency Department immediately if you develop any of these symptoms  F/U PCP A Krebs for evaliation of  BP and general medical  condition  F/U surgical evaluation. May consider pending follow-up evaluations  Ask the nurses and secretary the date of your OPEN lumbar MRI  F/U neurological evaluation. May consider pending follow-up evaluations  May consider radiofrequency rhizolysis or intraspinal procedures pending response to present treatment and F/U evaluation   Patient to call Pain Management Center should patient have concerns prior to scheduled return appointment.

## 2016-04-21 NOTE — Progress Notes (Signed)
     The patient is a 60 year old female who returns to pain management for further evaluation and treatment of pain involving the lumbar lower extremity region predominantly. The patient is status post lumbar epidural steroid injection as had improvement of her pain of significant degree. We will continue medication tramadol as discussed. We will consider modification of treatment regimen pending follow-up evaluation. The patient denies trauma change in events of daily living the cost change in symptomatology and stated that she was doing very well at this time. We discuss surgical evaluation which patient prefers to avoid and neurological evaluations as well. The patient is to call pain management should there be significant change in her condition prior to scheduled return appointment. The patient was provided prescription for tramadol on today's visit. All agreed to suggested treatment plan. Patient is doing well at this time.    Physical examination  There was tenderness to palpation of the paraspinal musculature region cervical region cervical facet region a mild degree with mild tenderness of the splenius capitis and occipitalis regions. Palpation over the region of the cervical facet cervical paraspinal musculature region was with tenderness to palpation of minimal degree. There was minimal tenderness of the acromioclavicular and glenohumeral joint region and patient was with unremarkable drop test and unremarkable Spurling's maneuver. The patient was with bilaterally equal grip strength and Tinel and Phalen's maneuver were without increase of pain of significant degree. Palpation over the lumbar region was attends to palpation of mild degree with lateral bending rotation extension and palpation over the lumbar facets reproducing mild discomfort. Straight leg raise was tolerates approximately 30 without increased pain with dorsiflexion noted. There was negative clonus negative Homans without  sensory deficit of dermatomal distribution detected. There was mild tenderness of the knees with negative anterior and posterior drawer signs without ballottement of the patella. Palpation over the PSIS and PII S region as well as the gluteal and piriformis musculature region and greater trochanteric region reproduced pain of mild degree. No sensory deficit of dermatomal distribution detected. There was negative clonus negative Homans. Abdomen was nontender with no costovertebral angle tenderness noted.     Assessment    Degenerative disc disease lumbar spine  Lumbar stenosis with neurogenic claudication  Lumbar facet syndrome  Sacroiliac joint dysfunction  Diabetes mellitus with diabetic neuropathy  Arthritis      PLAN  Continue present medication tramadol . CAUTION Medication can cause respiratory depression and cause you to stop breathing, cause excessive sedation, cause confusion and other side effects.  Exercise extreme caution when taking medication and call EMS or go to the Emergency Department immediately if you develop any of these symptoms  F/U PCP A Krebs for evaluation of  BP and general medical  condition  F/U surgical evaluation. May consider pending follow-up evaluations  Ask the nurses and secretary the date of your OPEN lumbar MRI  F/U neurological evaluation. May consider pending follow-up evaluations  May consider radiofrequency rhizolysis or intraspinal procedures pending response to present treatment and F/U evaluation   Patient to call Pain Management Center should patient have concerns prior to scheduled return appointment.

## 2016-05-01 ENCOUNTER — Other Ambulatory Visit: Payer: Self-pay | Admitting: Family Medicine

## 2016-05-01 ENCOUNTER — Encounter: Payer: Self-pay | Admitting: Family Medicine

## 2016-05-01 ENCOUNTER — Other Ambulatory Visit: Payer: Self-pay | Admitting: Pain Medicine

## 2016-05-01 DIAGNOSIS — B379 Candidiasis, unspecified: Secondary | ICD-10-CM

## 2016-05-01 MED ORDER — FLUCONAZOLE 150 MG PO TABS: 150.0000 mg | ORAL_TABLET | Freq: Once | ORAL | 2 refills | Status: AC

## 2016-05-09 ENCOUNTER — Telehealth: Payer: Self-pay | Admitting: *Deleted

## 2016-05-29 ENCOUNTER — Ambulatory Visit: Payer: BLUE CROSS/BLUE SHIELD | Admitting: Pain Medicine

## 2016-06-19 ENCOUNTER — Other Ambulatory Visit: Payer: BLUE CROSS/BLUE SHIELD

## 2016-06-19 ENCOUNTER — Other Ambulatory Visit: Payer: Self-pay | Admitting: Family Medicine

## 2016-06-19 DIAGNOSIS — E785 Hyperlipidemia, unspecified: Secondary | ICD-10-CM

## 2016-06-19 DIAGNOSIS — E1169 Type 2 diabetes mellitus with other specified complication: Secondary | ICD-10-CM

## 2016-06-19 DIAGNOSIS — E1142 Type 2 diabetes mellitus with diabetic polyneuropathy: Secondary | ICD-10-CM

## 2016-06-19 DIAGNOSIS — D72820 Lymphocytosis (symptomatic): Secondary | ICD-10-CM

## 2016-06-19 LAB — LIPID PANEL
Cholesterol: 208 mg/dL — ABNORMAL HIGH (ref 125–200)
HDL: 46 mg/dL (ref >=46)
LDL Cholesterol: 130 mg/dL — ABNORMAL HIGH (ref <130)
Total CHOL/HDL Ratio: 4.5 Ratio (ref <=5.0)
Triglycerides: 158 mg/dL — ABNORMAL HIGH (ref <150)
VLDL: 32 mg/dL — ABNORMAL HIGH (ref <30)

## 2016-06-19 LAB — COMPLETE METABOLIC PANEL WITH GFR
ALBUMIN: 4.2 g/dL (ref 3.6–5.1)
ALK PHOS: 89 U/L (ref 33–130)
ALT: 20 U/L (ref 6–29)
AST: 24 U/L (ref 10–35)
BILIRUBIN TOTAL: 0.5 mg/dL (ref 0.2–1.2)
BUN: 15 mg/dL (ref 7–25)
CO2: 26 mmol/L (ref 20–31)
CREATININE: 0.76 mg/dL (ref 0.50–0.99)
Calcium: 9.5 mg/dL (ref 8.6–10.4)
Chloride: 103 mmol/L (ref 98–110)
GFR, Est Non African American: 86 mL/min (ref >=60)
GLUCOSE: 135 mg/dL — AB (ref 65–99)
Potassium: 4.1 mmol/L (ref 3.5–5.3)
SODIUM: 143 mmol/L (ref 135–146)
TOTAL PROTEIN: 7.5 g/dL (ref 6.1–8.1)

## 2016-06-20 ENCOUNTER — Ambulatory Visit (INDEPENDENT_AMBULATORY_CARE_PROVIDER_SITE_OTHER): Payer: BLUE CROSS/BLUE SHIELD | Admitting: Family Medicine

## 2016-06-20 VITALS — BP 118/74 | HR 76 | Temp 98.7°F | Resp 16 | Ht 62.0 in | Wt 244.0 lb

## 2016-06-20 DIAGNOSIS — E1142 Type 2 diabetes mellitus with diabetic polyneuropathy: Secondary | ICD-10-CM | POA: Diagnosis not present

## 2016-06-20 DIAGNOSIS — I10 Essential (primary) hypertension: Secondary | ICD-10-CM | POA: Diagnosis not present

## 2016-06-20 DIAGNOSIS — E785 Hyperlipidemia, unspecified: Secondary | ICD-10-CM | POA: Diagnosis not present

## 2016-06-20 DIAGNOSIS — D75839 Thrombocytosis, unspecified: Secondary | ICD-10-CM

## 2016-06-20 DIAGNOSIS — I429 Cardiomyopathy, unspecified: Secondary | ICD-10-CM

## 2016-06-20 DIAGNOSIS — Z23 Encounter for immunization: Secondary | ICD-10-CM | POA: Diagnosis not present

## 2016-06-20 DIAGNOSIS — E1169 Type 2 diabetes mellitus with other specified complication: Secondary | ICD-10-CM

## 2016-06-20 DIAGNOSIS — D473 Essential (hemorrhagic) thrombocythemia: Secondary | ICD-10-CM

## 2016-06-20 LAB — CBC WITH DIFFERENTIAL/PLATELET
BASOS ABS: 101 {cells}/uL (ref 0–200)
Basophils Relative: 1 %
EOS ABS: 505 {cells}/uL — AB (ref 15–500)
EOS PCT: 5 %
HCT: 40 % (ref 35.0–45.0)
Hemoglobin: 13 g/dL (ref 11.7–15.5)
LYMPHS PCT: 36 %
Lymphs Abs: 3636 cells/uL (ref 850–3900)
MCH: 26.9 pg — AB (ref 27.0–33.0)
MCHC: 32.5 g/dL (ref 32.0–36.0)
MCV: 82.6 fL (ref 80.0–100.0)
MONOS PCT: 6 %
MPV: 9.5 fL (ref 7.5–12.5)
Monocytes Absolute: 606 cells/uL (ref 200–950)
Neutro Abs: 5252 cells/uL (ref 1500–7800)
Neutrophils Relative %: 52 %
PLATELETS: 533 10*3/uL — AB (ref 140–400)
RBC: 4.84 MIL/uL (ref 3.80–5.10)
RDW: 13.9 % (ref 11.0–15.0)
WBC: 10.1 10*3/uL (ref 3.8–10.8)

## 2016-06-20 LAB — POCT GLYCOSYLATED HEMOGLOBIN (HGB A1C): Hemoglobin A1C: 7.3

## 2016-06-20 MED ORDER — ATORVASTATIN CALCIUM 20 MG PO TABS: 20.0000 mg | ORAL_TABLET | Freq: Every day | ORAL | 3 refills | Status: DC

## 2016-06-20 MED ORDER — ZOSTER VACCINE LIVE 19400 UNT/0.65ML ~~LOC~~ SUSR: 0.6500 mL | Freq: Once | SUBCUTANEOUS | 0 refills | Status: AC

## 2016-06-20 NOTE — Assessment & Plan Note (Signed)
Great improvement in A1c from last visit. Continue invokana once daily. Encouraged continued diet and lifestyle interventions.  Eye exam: Referred in July- requesting report. Foot exam UTD. UA microalbumin UTD.  Return 3 mos.

## 2016-06-20 NOTE — Assessment & Plan Note (Signed)
Followed by Dr, Nehemiah Massed.  Normal LV function. Mild LVH.  Will continue to follow q35mos.

## 2016-06-20 NOTE — Patient Instructions (Signed)
Your goal blood pressure is 140/90 Work on low salt/sodium diet - goal <1.5gm (1,500mg) per day. Eat a diet high in fruits/vegetables and whole grains.  Look into mediterranean and DASH diet. Goal activity is 150min/wk of moderate intensity exercise.  This can be split into 30 minute chunks.  If you are not at this level, you can start with smaller 10-15 min increments and slowly build up activity. Look at www.heart.org for more resources  Please seek immediate medical attention at ER or Urgent Care if you develop: Chest pain, pressure or tightness. Shortness of breath accompanied by nausea or diaphoresis Visual changes Numbness or tingling on one side of the body Facial droop Altered mental status Or any concerning symptoms.   

## 2016-06-20 NOTE — Assessment & Plan Note (Signed)
Controlled today. Continue current regimen. Normal kidney function after acute kidney injury. Continue DASH diet. Return 3 mos.

## 2016-06-20 NOTE — Assessment & Plan Note (Signed)
Pt is not on a statin. Plan to start atorvastatin 20mg  once daily to help reduce risk of heart attack or stroke.

## 2016-06-20 NOTE — Progress Notes (Signed)
Subjective:    Patient ID: Stacy Martinez, female    DOB: 05-12-1956, 60 y.o.   MRN: [id number]  HPI: Stacy Martinez is a 60 y.o. female presenting on 06/20/2016 for Diabetes (highest BS 350-400 and lowest 140 )   HPI  Pt presents for follow-up of diabetes. Started invokana last visit- A1c was 9.7% now down to 7.3%. Trying to drink lots of water. Exercise regularly. Walking daily. 10lb weight loss.  Bp doing well. No dizziness. No shortness breath. No chest pain. Recent check by cardiology showed normal LV function with mild LVH. Doing well. Q6 mos follow-up.    Past Medical History:  Diagnosis Date  . Allergy   . Arthritis   . Diabetes mellitus without complication ([place])   . Enlarged heart   . Hypertension     Current Outpatient Prescriptions on File Prior to Visit  Medication Sig  . amLODipine (NORVASC) 10 MG tablet Take 1 tablet (10 mg total) by mouth daily.  . canagliflozin (INVOKANA) 300 MG TABS tablet Take 1 tablet (300 mg total) by mouth daily before breakfast.  . carvedilol (COREG) 25 MG tablet Take 1 tablet (25 mg total) by mouth 2 (two) times daily with a meal.  . enalapril (VASOTEC) 20 MG tablet Take 1 tablet (20 mg total) by mouth 2 (two) times daily.  . furosemide (LASIX) 40 MG tablet Take 2 tablets (80 mg total) by mouth daily.  Marland Kitchen glucose blood (RELION GLUCOSE TEST STRIPS) test strip Use as instructed  . metFORMIN (GLUCOPHAGE) 1000 MG tablet Take 1 tablet (1,000 mg total) by mouth 2 (two) times daily with a meal.  . ReliOn Ultra Thin Lancets MISC 1 each by Does not apply route daily.  . traMADol (ULTRAM) 50 MG tablet Limit one half to one tab by mouth per day or every other day if tolerated   Current Facility-Administered Medications on File Prior to Visit  Medication  . bupivacaine (PF) (MARCAINE) 0.25 % injection 30 mL  . ceFAZolin (ANCEF) IVPB 1 g/50 mL premix  . fentaNYL (SUBLIMAZE) injection 100 mcg  . lactated ringers infusion 1,000 mL  . lactated ringers  infusion 1,000 mL  . lactated ringers infusion 1,000 mL  . lidocaine (PF) (XYLOCAINE) 1 % injection 10 mL  . midazolam (VERSED) 5 MG/5ML injection 5 mg  . orphenadrine (NORFLEX) injection 60 mg  . sodium chloride flush (NS) 0.9 % injection 20 mL  . triamcinolone acetonide (KENALOG-40) injection 40 mg    Review of Systems  Constitutional: Negative for chills and fever.  HENT: Negative.   Respiratory: Negative for cough, chest tightness and wheezing.   Cardiovascular: Negative for chest pain and leg swelling.  Gastrointestinal: Negative for abdominal pain, constipation, diarrhea, nausea and vomiting.  Endocrine: Negative.  Negative for cold intolerance, heat intolerance, polydipsia, polyphagia and polyuria.  Genitourinary: Negative for difficulty urinating and dysuria.  Musculoskeletal: Negative.   Neurological: Negative for dizziness, light-headedness and numbness.  Psychiatric/Behavioral: Negative.    Per HPI unless specifically indicated above     Objective:    BP 118/74   Pulse 76   Temp 98.7 F (37.1 C) (Oral)   Resp 16   Ht 5\' 2"  (1.575 m)   Wt 244 lb (110.7 kg)   BMI 44.63 kg/m   Wt Readings from Last 3 Encounters:  06/20/16 244 lb (110.7 kg)  04/21/16 250 lb (113.4 kg)  04/14/16 250 lb (113.4 kg)    Physical Exam  Constitutional: She is oriented to person,  place, and time. She appears well-developed and well-nourished.  HENT:  Head: Normocephalic and atraumatic.  Neck: Neck supple.  Cardiovascular: Normal rate, regular rhythm and normal heart sounds.  Exam reveals no gallop and no friction rub.   No murmur heard. Pulmonary/Chest: Effort normal and breath sounds normal. She has no wheezes. She exhibits no tenderness.  Abdominal: Soft. Normal appearance and bowel sounds are normal. She exhibits no distension and no mass. There is no tenderness. There is no rebound and no guarding.  Musculoskeletal: Normal range of motion. She exhibits no edema or tenderness.    Lymphadenopathy:    She has no cervical adenopathy.  Neurological: She is alert and oriented to person, place, and time.  Skin: Skin is warm and dry.   Diabetic Foot Exam - Simple   Simple Foot Form Diabetic Foot exam was performed with the following findings:  Yes 06/20/2016 12:10 PM  Visual Inspection No deformities, no ulcerations, no other skin breakdown bilaterally:  Yes Sensation Testing Intact to touch and monofilament testing bilaterally:  Yes Pulse Check Posterior Tibialis and Dorsalis pulse intact bilaterally:  Yes Comments     Results for orders placed or performed in visit on 06/20/16  POCT HgB A1C  Result Value Ref Range   Hemoglobin A1C 7.3       Assessment & Plan:   Problem List Items Addressed This Visit      Cardiovascular and Mediastinum   BP (high blood pressure)    Controlled today. Continue current regimen. Normal kidney function after acute kidney injury. Continue DASH diet. Return 3 mos.       Relevant Medications   atorvastatin (LIPITOR) 20 MG tablet   Cardiomyopathy ([place])    Followed by Dr, Nehemiah Massed.  Normal LV function. Mild LVH.  Will continue to follow q73mos.       Relevant Medications   atorvastatin (LIPITOR) 20 MG tablet     Endocrine   DM type 2 with diabetic peripheral neuropathy (HCC)    Great improvement in A1c from last visit. Continue invokana once daily. Encouraged continued diet and lifestyle interventions.  Eye exam: Referred in July- requesting report. Foot exam UTD. UA microalbumin UTD.  Return 3 mos.       Relevant Medications   atorvastatin (LIPITOR) 20 MG tablet   Other Relevant Orders   POCT HgB A1C (Completed)   Hyperlipidemia associated with type 2 diabetes mellitus ([place]) - Primary    Pt is not on a statin. Plan to start atorvastatin 20mg  once daily to help reduce risk of heart attack or stroke.       Relevant Medications   atorvastatin (LIPITOR) 20 MG tablet    Other Visit Diagnoses    Need for Zostavax  administration       Relevant Medications   Zoster Vaccine Live, PF, (ZOSTAVAX) 09811 UNT/0.65ML injection   Need for influenza vaccination       Relevant Orders   Flu Vaccine QUAD 36+ mos PF IM (Fluarix & Fluzone Quad PF) (Completed)   Thrombocytosis (HCC)       Elevated platelets- may be reactive or iron related. add on iron studies. Recommend pt take baby aspirin. Recheck 1 mos and if still elevated consider hematolog      Meds ordered this encounter  Medications  . atorvastatin (LIPITOR) 20 MG tablet    Sig: Take 1 tablet (20 mg total) by mouth daily.    Dispense:  90 tablet    Refill:  3  Order Specific Question:   Supervising Provider    Answer:   Arlis Porta [id number]  . Zoster Vaccine Live, PF, (ZOSTAVAX) 16109 UNT/0.65ML injection    Sig: Inject 19,400 Units into the skin once.    Dispense:  1 each    Refill:  0    Order Specific Question:   Supervising Provider    Answer:   Arlis Porta [id number]      Follow up plan: Return in about 3 months (around 09/20/2016), or if symptoms worsen or fail to improve, for Diabetes. Marland Kitchen

## 2016-06-24 ENCOUNTER — Ambulatory Visit: Payer: BLUE CROSS/BLUE SHIELD | Admitting: Family Medicine

## 2016-10-21 DIAGNOSIS — I872 Venous insufficiency (chronic) (peripheral): Secondary | ICD-10-CM | POA: Insufficient documentation

## 2016-10-22 LAB — HM DIABETES EYE EXAM

## 2017-01-02 ENCOUNTER — Other Ambulatory Visit: Payer: Self-pay

## 2017-01-02 MED ORDER — AMLODIPINE BESYLATE 10 MG PO TABS: 10.0000 mg | ORAL_TABLET | Freq: Every day | ORAL | 0 refills | Status: DC

## 2017-01-02 MED ORDER — FUROSEMIDE 40 MG PO TABS: 80.0000 mg | ORAL_TABLET | Freq: Every day | ORAL | 0 refills | Status: DC

## 2017-01-02 MED ORDER — CARVEDILOL 25 MG PO TABS: 25.0000 mg | ORAL_TABLET | Freq: Two times a day (BID) | ORAL | 0 refills | Status: DC

## 2017-01-02 NOTE — Telephone Encounter (Signed)
Last ov  06/20/16 Last filled 12/25/15

## 2017-01-02 NOTE — Telephone Encounter (Signed)
Patient has not been seen in office for >6 months, previously followed by Fredia Sorrow NP.  I have refilled her requested medications for 1 month supply.  Please notify her to schedule follow-up appointment to re-establish care now that Amy is no longer here, for any further refills on these medications.  Nobie Putnam, [facility] 01/02/2017, 4:55 PM

## 2017-01-05 NOTE — Telephone Encounter (Signed)
NA. Mail box is full. sd

## 2017-01-06 LAB — HEMOGLOBIN A1C: Hemoglobin A1C: 8

## 2017-02-13 ENCOUNTER — Other Ambulatory Visit: Payer: Self-pay | Admitting: Family Medicine

## 2017-02-13 DIAGNOSIS — Z1239 Encounter for other screening for malignant neoplasm of breast: Secondary | ICD-10-CM

## 2017-03-18 ENCOUNTER — Other Ambulatory Visit: Payer: Self-pay | Admitting: Family Medicine

## 2017-04-02 ENCOUNTER — Encounter: Payer: Self-pay | Admitting: Nurse Practitioner

## 2017-04-02 ENCOUNTER — Ambulatory Visit (INDEPENDENT_AMBULATORY_CARE_PROVIDER_SITE_OTHER): Payer: BLUE CROSS/BLUE SHIELD | Admitting: Nurse Practitioner

## 2017-04-02 VITALS — BP 130/72 | HR 65 | Temp 98.2°F | Ht 62.0 in | Wt 243.6 lb

## 2017-04-02 DIAGNOSIS — E1142 Type 2 diabetes mellitus with diabetic polyneuropathy: Secondary | ICD-10-CM

## 2017-04-02 DIAGNOSIS — I10 Essential (primary) hypertension: Secondary | ICD-10-CM

## 2017-04-02 DIAGNOSIS — M545 Low back pain: Secondary | ICD-10-CM | POA: Diagnosis not present

## 2017-04-02 DIAGNOSIS — E785 Hyperlipidemia, unspecified: Secondary | ICD-10-CM | POA: Diagnosis not present

## 2017-04-02 DIAGNOSIS — G8929 Other chronic pain: Secondary | ICD-10-CM | POA: Diagnosis not present

## 2017-04-02 DIAGNOSIS — E1169 Type 2 diabetes mellitus with other specified complication: Secondary | ICD-10-CM

## 2017-04-02 MED ORDER — AMLODIPINE BESYLATE 10 MG PO TABS: 10.0000 mg | ORAL_TABLET | Freq: Every day | ORAL | 0 refills | Status: DC

## 2017-04-02 MED ORDER — ROSUVASTATIN CALCIUM 5 MG PO TABS: 5.0000 mg | ORAL_TABLET | Freq: Every day | ORAL | 3 refills | Status: DC

## 2017-04-02 MED ORDER — EXENATIDE ER 2 MG/0.85ML ~~LOC~~ AUIJ: 2.0000 mg | AUTO-INJECTOR | SUBCUTANEOUS | 1 refills | Status: DC

## 2017-04-02 NOTE — Patient Instructions (Addendum)
[place], Thank you for coming in to clinic today.  1. FOR you diabetes: - Take 500 mg metformin twice daily. - If still having GI side effects, can change to extended release.  Please call clinic for updates before your next appointment. - START taking byureon bcise injection once weekly.  Choose the same day each week to inject. - Continue checking glucose in the morning.  2. FOR your blood pressure: - Continue all medications without changes. - Continue checking blood pressure once per week  3. FOR your cholesterol: - Recheck lipid panel. - WE are going to consider Zetia (pill) and Praluent or Repatha (injectable) if your lipids are still not at goal.  You will be due for FASTING BLOOD WORK (no food or drink after midnight before, only water or coffee without cream/sugar on the morning of) - Please go ahead and schedule a "Lab Only" visit in the morning at the clinic for lab draw in On Thursday OR Friday next week.  For Lab Results, once available within 2-3 days of blood draw, you can can log in to MyChart online to view your results and a brief explanation. Also, we can discuss results at next follow-up visit.  Please schedule a follow-up appointment with Cassell Smiles, AGNP. Return in about 3 months (around 07/10/2017) for diabetes, blood pressure, cholesterol; and annual physical at your convenience in the next 2 months.  If you have any other questions or concerns, please feel free to call the clinic or send a message through [place]. You may also schedule an earlier appointment if necessary.  You will receive a survey after today's visit either digitally by e-mail or paper by [employer]. Your experiences and feedback matter to [place].  Please respond so we know how we are doing as we provide care for you.  Cassell Smiles, DNP, AGNP-BC Adult Gerontology Nurse Practitioner [place]

## 2017-04-02 NOTE — Progress Notes (Signed)
Subjective:    Patient ID: Stacy Martinez, female    DOB: December 30, 1955, 60 y.o.   MRN: [id number]  Stacy Martinez is a 61 y.o. female presenting on 04/02/2017 for Diabetes (f/u blood pressure )   HPI Diabetes Pt presents today for follow up of Type 2 diabetes mellitus. She is checking fasting am CBG at home with a range of 118-190, usually 140-150 - Current diabetic medications include: metformin and jardiance and has lots of gas and diarrhea. Is taking metformin 1,000 mg once daily and Jardiance - She  has symptoms of paresthesia of the feet, polydipsia and polyuria. She denies polyphagia, headaches, diaphoresis, shakiness, chills, pain, numbness or tingling in extremities and changes in vision.  Clinical course has been unchanged. -She  reports no regular exercise routine. Her diet is low in salt, low in fat, and moderate in carbohydrates. Weight trend: stable  PREVENTION: Eye exam current (within one year): yes February - no retinopathy ([address]) Foot exam current (within one year): yes  Recent Labs  06/20/16 0952 01/06/17  HGBA1C 7.3 8.0    Hypertension She is checking BP at home.  Readings 189/98 Sunday night off amlodipine. On amlodipine 130/60s  Current medications: carvedilol 25 mg twice daily, enalapril 20 mg twice daily, amlodipine 10 mg once daily.  tolerating well without side effects.  Pt notes she has been off amlodipine for a few weeks.  Pt denies headache, lightheadedness, dizziness, changes in vision, chest tightness/pressure, palpitations, leg swelling, sudden loss of speech or loss of consciousness.  Hyperlipidemia Pt has history of hyperlipidemia.  Is taking rosuvastatin 5 mg 3 days per week (mwf). Previous statin intolerance to atorvastatin r/t myalgias and arthralgias.  She is still having persistent myalgias and arthralgias even w/ intermittent statin use. Pt has not had any ASCVD events in past.  NO family history of heart  attack/stroke or hyperlipidemia.   Social History  Substance Use Topics  . Smoking status: Never Smoker  . Smokeless tobacco: Never Used  . Alcohol use No    Review of Systems Per HPI unless specifically indicated above     Objective:    BP 130/72 (BP Location: Right Arm, Patient Position: Sitting, Cuff Size: Large)   Pulse 65   Temp 98.2 F (36.8 C) (Oral)   Ht 5\' 2"  (1.575 m)   Wt 243 lb 9.6 oz (110.5 kg)   BMI 44.56 kg/m   Wt Readings from Last 3 Encounters:  04/02/17 243 lb 9.6 oz (110.5 kg)  06/20/16 244 lb (110.7 kg)  04/21/16 250 lb (113.4 kg)    Physical Exam  General - morbidly obese, well-appearing, NAD HEENT - Normocephalic, atraumatic Neck - supple, non-tender, no LAD, no carotid bruit Heart - RRR, no murmurs heard Lungs - Clear throughout all lobes, no wheezing, crackles, or rhonchi. Normal work of breathing. Abdomen - soft, NTND, no masses, no hepatosplenomegaly by scratch test, active bowel sounds Extremeties - non-tender, no edema, cap refill < 2 seconds, peripheral pulses intact +2 bilaterally Skin - warm, dry, no rashes Neuro - awake, alert, oriented x3, antalgic gait Psych - Normal mood and affect, normal behavior    Results for orders placed or performed in visit on 04/02/17  Hemoglobin A1c  Result Value Ref Range   Hemoglobin A1C 8.0       Assessment & Plan:   Problem List Items Addressed This Visit      Cardiovascular and Mediastinum   BP (high blood pressure)  At goal today.  Pt has been off amlodipine and will achieve better BP control when resumed.  Pt had measured increased BP while on amlodipine at home and was previously asymptomatic.  Plan: 1. Continue current medication regimen of carvedilol 25 mg twice daily, enalapril 20 mg twice daily, amlodipine 10 mg once daily. 2. Recheck labs today. 3. DASH diet. 4. Follow up 3 months      Relevant Medications   amLODipine (NORVASC) 10 MG tablet   rosuvastatin (CRESTOR) 5 MG  tablet   Other Relevant Orders   Comprehensive metabolic panel   Lipid panel     Endocrine   DM type 2 with diabetic peripheral neuropathy ([place]) - Primary    Uncontrolled w/ A1c above goal at last check in May 2018.  6 days before 120 days.  Orders placed for lab collect in 6 days to reassess.  Pt notes dietary indiscretions and intolerance of metformin.  Plan: 1. Take metformin 500 mg bid instead of 1,000 once daily.  Consider transition to extended release. 2. START bydureon bcise pen.  Pt needs improved glucose control and would also like to see weight loss.  Education provided. Take same day once per week. 3. Continue checking blood glucose once daily in am fasting. 4. Follow up 3 months.       Relevant Medications   empagliflozin (JARDIANCE) 25 MG TABS tablet   Exenatide ER (BYDUREON BCISE) 2 MG/0.85ML AUIJ   rosuvastatin (CRESTOR) 5 MG tablet   Other Relevant Orders   Comprehensive metabolic panel   Lipid panel   Hemoglobin A1c   Hyperlipidemia associated with type 2 diabetes mellitus ([place])    Pt is on a statin. Previously intolerance to atorvastatin w/ myalgias and arthralgias.  Current intolerance to rosuvastatin M, W, F w/ myalgias and arthralgias.  No recent evaluation of lipid panel.  Plan: 1. Lipid panel w/ next lab collection. 2. Continue max tolerated rosuvastatin dose.  Consider adding zetia or Praluent/Repatha if ideal control not achieved. 3. Follow up 3 months.       Relevant Medications   empagliflozin (JARDIANCE) 25 MG TABS tablet   amLODipine (NORVASC) 10 MG tablet   Exenatide ER (BYDUREON BCISE) 2 MG/0.85ML AUIJ   rosuvastatin (CRESTOR) 5 MG tablet   Other Relevant Orders   Comprehensive metabolic panel   Lipid panel    Other Visit Diagnoses    Chronic left-sided low back pain, with sciatica presence unspecified       Pt requests to re-establish with pain management for epidural injections.   Plan: Referral placed to [facility] management.    Relevant Orders   Ambulatory referral to Pain Clinic      Meds ordered this encounter  Medications  . empagliflozin (JARDIANCE) 25 MG TABS tablet    Sig: Take 25 mg by mouth daily.  Marland Kitchen amLODipine (NORVASC) 10 MG tablet    Sig: Take 1 tablet (10 mg total) by mouth daily.    Dispense:  30 tablet    Refill:  0    Order Specific Question:   Supervising Provider    Answer:   Olin Hauser [[id number]]  . Exenatide ER (BYDUREON BCISE) 2 MG/0.85ML AUIJ    Sig: Inject 2 mg into the skin once a week.    Dispense:  12 pen    Refill:  1    Order Specific Question:   Supervising Provider    Answer:   Olin Hauser [[id number]]  . rosuvastatin (CRESTOR) 5 MG tablet  Sig: Take 1 tablet (5 mg total) by mouth daily.    Dispense:  90 tablet    Refill:  3    Order Specific Question:   Supervising Provider    Answer:   Olin Hauser [[id number]]      Follow up plan: Return in about 3 months (around 07/10/2017) for diabetes, blood pressure, cholesterol; and annual physical at your convenience in the next 2 months.   Cassell Smiles, DNP, AGPCNP-BC Adult Gerontology Primary Care Nurse Practitioner [facility] 04/03/2017, 5:31 PM

## 2017-04-03 NOTE — Assessment & Plan Note (Signed)
Uncontrolled w/ A1c above goal at last check in May 2018.  6 days before 120 days.  Orders placed for lab collect in 6 days to reassess.  Pt notes dietary indiscretions and intolerance of metformin.  Plan: 1. Take metformin 500 mg bid instead of 1,000 once daily.  Consider transition to extended release. 2. START bydureon bcise pen.  Pt needs improved glucose control and would also like to see weight loss.  Education provided. Take same day once per week. 3. Continue checking blood glucose once daily in am fasting. 4. Follow up 3 months.

## 2017-04-03 NOTE — Assessment & Plan Note (Addendum)
Pt is on a statin. Previously intolerance to atorvastatin w/ myalgias and arthralgias.  Current intolerance to rosuvastatin M, W, F w/ myalgias and arthralgias.  No recent evaluation of lipid panel.  Plan: 1. Lipid panel w/ next lab collection. 2. Continue max tolerated rosuvastatin dose.  Consider adding zetia or Praluent/Repatha if ideal control not achieved. 3. Follow up 3 months.

## 2017-04-03 NOTE — Progress Notes (Signed)
I have reviewed this encounter including the documentation in this note and/or discussed this patient with the provider, Cassell Smiles, AGPCNP-BC. I am certifying that I agree with the content of this note as supervising physician.  Nobie Putnam, [facility] 04/03/2017, 6:22 PM

## 2017-04-03 NOTE — Assessment & Plan Note (Addendum)
At goal today.  Pt has been off amlodipine and will achieve better BP control when resumed.  Pt had measured increased BP while on amlodipine at home and was previously asymptomatic.  Plan: 1. Continue current medication regimen of carvedilol 25 mg twice daily, enalapril 20 mg twice daily, amlodipine 10 mg once daily. 2. Recheck labs today. 3. DASH diet. 4. Follow up 3 months

## 2017-04-06 ENCOUNTER — Other Ambulatory Visit: Payer: Self-pay | Admitting: Nurse Practitioner

## 2017-04-06 MED ORDER — EXENATIDE ER 2 MG/0.85ML ~~LOC~~ AUIJ: 2.0000 mg | AUTO-INJECTOR | SUBCUTANEOUS | 1 refills | Status: DC

## 2017-04-07 ENCOUNTER — Telehealth: Payer: Self-pay

## 2017-04-07 NOTE — Telephone Encounter (Signed)
Mailed out a letter to the pt with a coupon.

## 2017-04-08 ENCOUNTER — Other Ambulatory Visit: Payer: BLUE CROSS/BLUE SHIELD

## 2017-04-09 LAB — HEMOGLOBIN A1C
Hgb A1c MFr Bld: 7.9 % — ABNORMAL HIGH (ref <5.7)
Mean Plasma Glucose: 180 mg/dL

## 2017-04-09 LAB — COMPREHENSIVE METABOLIC PANEL
ALT: 13 U/L (ref 6–29)
AST: 13 U/L (ref 10–35)
Albumin: 4.1 g/dL (ref 3.6–5.1)
Alkaline Phosphatase: 119 U/L (ref 33–130)
BUN: 19 mg/dL (ref 7–25)
CO2: 24 mmol/L (ref 20–32)
Calcium: 9.3 mg/dL (ref 8.6–10.4)
Chloride: 103 mmol/L (ref 98–110)
Creat: 0.94 mg/dL (ref 0.50–0.99)
Glucose, Bld: 146 mg/dL — ABNORMAL HIGH (ref 65–99)
Potassium: 4.2 mmol/L (ref 3.5–5.3)
Sodium: 140 mmol/L (ref 135–146)
Total Bilirubin: 0.5 mg/dL (ref 0.2–1.2)
Total Protein: 7.2 g/dL (ref 6.1–8.1)

## 2017-04-09 LAB — LIPID PANEL
Cholesterol: 170 mg/dL (ref <200)
HDL: 48 mg/dL — ABNORMAL LOW (ref >50)
LDL Cholesterol: 89 mg/dL (ref <100)
Total CHOL/HDL Ratio: 3.5 Ratio (ref <5.0)
Triglycerides: 167 mg/dL — ABNORMAL HIGH (ref <150)
VLDL: 33 mg/dL — ABNORMAL HIGH (ref <30)

## 2017-04-16 ENCOUNTER — Ambulatory Visit: Payer: BLUE CROSS/BLUE SHIELD | Admitting: Student in an Organized Health Care Education/Training Program

## 2017-04-23 ENCOUNTER — Ambulatory Visit
Payer: BLUE CROSS/BLUE SHIELD | Attending: Student in an Organized Health Care Education/Training Program | Admitting: Student in an Organized Health Care Education/Training Program

## 2017-04-23 ENCOUNTER — Encounter: Payer: Self-pay | Admitting: Student in an Organized Health Care Education/Training Program

## 2017-04-23 VITALS — BP 157/83 | HR 70 | Temp 98.2°F | Resp 18 | Ht 62.0 in | Wt 240.0 lb

## 2017-04-23 DIAGNOSIS — Z791 Long term (current) use of non-steroidal anti-inflammatories (NSAID): Secondary | ICD-10-CM | POA: Diagnosis not present

## 2017-04-23 DIAGNOSIS — M1990 Unspecified osteoarthritis, unspecified site: Secondary | ICD-10-CM | POA: Diagnosis not present

## 2017-04-23 DIAGNOSIS — M170 Bilateral primary osteoarthritis of knee: Secondary | ICD-10-CM

## 2017-04-23 DIAGNOSIS — E1142 Type 2 diabetes mellitus with diabetic polyneuropathy: Secondary | ICD-10-CM | POA: Diagnosis not present

## 2017-04-23 DIAGNOSIS — M25561 Pain in right knee: Secondary | ICD-10-CM | POA: Diagnosis present

## 2017-04-23 DIAGNOSIS — M4696 Unspecified inflammatory spondylopathy, lumbar region: Secondary | ICD-10-CM

## 2017-04-23 DIAGNOSIS — E785 Hyperlipidemia, unspecified: Secondary | ICD-10-CM | POA: Diagnosis not present

## 2017-04-23 DIAGNOSIS — I129 Hypertensive chronic kidney disease with stage 1 through stage 4 chronic kidney disease, or unspecified chronic kidney disease: Secondary | ICD-10-CM | POA: Insufficient documentation

## 2017-04-23 DIAGNOSIS — M5136 Other intervertebral disc degeneration, lumbar region: Secondary | ICD-10-CM | POA: Insufficient documentation

## 2017-04-23 DIAGNOSIS — E1122 Type 2 diabetes mellitus with diabetic chronic kidney disease: Secondary | ICD-10-CM | POA: Insufficient documentation

## 2017-04-23 DIAGNOSIS — M47816 Spondylosis without myelopathy or radiculopathy, lumbar region: Secondary | ICD-10-CM | POA: Diagnosis not present

## 2017-04-23 DIAGNOSIS — M533 Sacrococcygeal disorders, not elsewhere classified: Secondary | ICD-10-CM | POA: Diagnosis not present

## 2017-04-23 DIAGNOSIS — E0841 Diabetes mellitus due to underlying condition with diabetic mononeuropathy: Secondary | ICD-10-CM | POA: Diagnosis not present

## 2017-04-23 DIAGNOSIS — M545 Low back pain: Secondary | ICD-10-CM | POA: Diagnosis present

## 2017-04-23 DIAGNOSIS — E1141 Type 2 diabetes mellitus with diabetic mononeuropathy: Secondary | ICD-10-CM | POA: Insufficient documentation

## 2017-04-23 DIAGNOSIS — F329 Major depressive disorder, single episode, unspecified: Secondary | ICD-10-CM | POA: Insufficient documentation

## 2017-04-23 DIAGNOSIS — G8929 Other chronic pain: Secondary | ICD-10-CM | POA: Diagnosis not present

## 2017-04-23 DIAGNOSIS — N189 Chronic kidney disease, unspecified: Secondary | ICD-10-CM | POA: Diagnosis not present

## 2017-04-23 DIAGNOSIS — M25562 Pain in left knee: Secondary | ICD-10-CM | POA: Diagnosis present

## 2017-04-23 DIAGNOSIS — I429 Cardiomyopathy, unspecified: Secondary | ICD-10-CM | POA: Diagnosis not present

## 2017-04-23 MED ORDER — DICLOFENAC SODIUM 1 % TD GEL: 4.0000 g | Freq: Four times a day (QID) | TRANSDERMAL | 2 refills | Status: AC

## 2017-04-23 MED ORDER — MELOXICAM 15 MG PO TABS: 7.5000 mg | ORAL_TABLET | Freq: Two times a day (BID) | ORAL | 0 refills | Status: AC

## 2017-04-23 MED ORDER — TIZANIDINE HCL 4 MG PO CAPS: 4.0000 mg | ORAL_CAPSULE | Freq: Two times a day (BID) | ORAL | 0 refills | Status: DC | PRN

## 2017-04-23 NOTE — Progress Notes (Signed)
Patient's Name: Stacy Martinez  MRN: [id number]  Referring Provider: Mikey College, *  DOB: [birth year]  PCP: Mikey College, NP  DOS: 04/23/2017  Note by: Gillis Santa, MD  Service setting: Ambulatory outpatient  Specialty: Interventional Pain Management  Location: [facility] (AMB) Pain Management Facility  Visit type: Initial Patient Evaluation  Patient type: New Patient   Primary Reason(s) for Visit: Encounter for initial evaluation of one or more chronic problems (new to examiner) potentially causing chronic pain, and posing a threat to normal musculoskeletal function. (Level of risk: High) CC: Back Pain (lower) and Knee Pain (both)  HPI  Stacy Martinez is a 61 y.o. year old, female patient, who comes today to see [place] for the first time for an initial evaluation of her chronic pain. She has Bowel obstruction ([place]); BP (high blood pressure); DM type 2 with diabetic peripheral neuropathy ([place]); Wound of skin; Hyperlipidemia associated with type 2 diabetes mellitus ([place]); DDD (degenerative disc disease), lumbar; Cardiomyopathy ([place]); Arthritis; Facet syndrome, lumbar ([place]); Diabetic neuropathy (HCC); DJD (degenerative joint disease) of knee; and Sacroiliac joint dysfunction on her problem list. Today she comes in for evaluation of her Back Pain (lower) and Knee Pain (both)  Pain Assessment: Location: Lower Back Radiating: right upper leg Onset: More than a month ago Duration: Chronic pain Quality: Dull, Throbbing, Heaviness (deep) Severity: 4 /10 (self-reported pain score)  Note: Reported level is compatible with observation.                   Effect on ADL: slower movements Timing: Intermittent Modifying factors: sitting, lying down  Onset and Duration: Gradual and Present longer than 3 months Cause of pain: Unknown Severity: Getting worse, NAS-11 at its worse: 8/10, NAS-11 at its best: 1/10, NAS-11 now: 4/10 and NAS-11 on the average: 4/10 Timing: Afternoon, During activity or exercise and  After activity or exercise Aggravating Factors: Kneeling, Prolonged standing and Walking Alleviating Factors: Lying down, Medications and Sleeping Associated Problems: Night-time cramps and Pain that does not allow patient to sleep Quality of Pain: Aching, Deep and Throbbing Previous Examinations or Tests: MRI scan Previous Treatments: Epidural steroid injections  The patient comes into the clinics today for the first time for a chronic pain management evaluation.   Patient is a 61 year old female with a past medical history of hypertension, type 2 diabetes (last A1c 7.3), cardiomyopathy with chronic low back pain secondary to lumbar degenerative disc disease, lumbar facet arthropathy, lumbar spondylosis and hip pain secondary to SI joint dysfunction. Patient also notes severe bilateral knee pain secondary to knee osteoarthritis. Patient was previously a patient of Dr. crisp at this clinic. Of note patient had a death in the family prior to Jan 25, 2017. Patient has had epidural steroid injections in the past, last visit was 04/14/2016 which helped out with her low back pain that radiated into her leg.  Patient today mainly complains of axial low back pain that radiates to her posterior thigh but does not extend beyond her knee. She notes a different type of knee pain and notes that his quality is different than her back pain that extends to the back of her thigh.  Today I took the time to provide the patient with information regarding my pain practice. The patient was informed that my practice is divided into two sections: an interventional pain management section, as well as a completely separate and distinct medication management section. I explained that I have procedure days for my interventional therapies, and evaluation  days for follow-ups and medication management. Because of the amount of documentation required during both, they are kept separated. This means that there is the possibility that she  may be scheduled for a procedure on one day, and medication management the next. I have also informed her that because of staffing and facility limitations, I no longer take patients for medication management only. To illustrate the reasons for this, I gave the patient the example of surgeons, and how inappropriate it would be to refer a patient to his/her care, just to write for the post-surgical antibiotics on a surgery done by a different surgeon.   Because interventional pain management is my board-certified specialty, the patient was informed that joining my practice means that they are open to any and all interventional therapies. I made it clear that this does not mean that they will be forced to have any procedures done. What this means is that I believe interventional therapies to be essential part of the diagnosis and proper management of chronic pain conditions. Therefore, patients not interested in these interventional alternatives will be better served under the care of a different practitioner.  The patient was also made aware of my Comprehensive Pain Management Safety Guidelines where by joining my practice, they limit all of their nerve blocks and joint injections to those done by our practice, for as long as we are retained to manage their care.   Historic Controlled Substance Pharmacotherapy Review  PMP and historical list of controlled substances: Tramadol 50 g, quantity 30, last fill 05/10/2016 Medications: The patient did not bring the medication(s) to the appointment, as requested in our "New Patient Package" Pharmacodynamics: Desired effects: Analgesia: The patient reports >50% benefit. Reported improvement in function: The patient reports medication allows her to accomplish basic ADLs. Clinically meaningful improvement in function (CMIF): Sustained CMIF goals met Perceived effectiveness: Described as relatively effective, allowing for increase in activities of daily living  (ADL) Undesirable effects: Side-effects or Adverse reactions: None reported Historical Monitoring: The patient  reports that she does not use drugs. List of all UDS Test(s): No results found for: MDMA, COCAINSCRNUR, PCPSCRNUR, PCPQUANT, CANNABQUANT, THCU, [place] List of all Serum Drug Screening Test(s):  No results found for: AMPHSCRSER, BARBSCRSER, BENZOSCRSER, COCAINSCRSER, PCPSCRSER, PCPQUANT, THCSCRSER, CANNABQUANT, OPIATESCRSER, OXYSCRSER, PROPOXSCRSER Historical Background Evaluation: [place] PDMP: Six (6) year initial data search conducted.             [place] Department of public safety, offender search: Editor, commissioning Information) Non-contributory Risk Assessment Profile: Aberrant behavior: None observed or detected today Risk factors for fatal opioid overdose: None identified today Fatal overdose hazard ratio (HR): Calculation deferred Non-fatal overdose hazard ratio (HR): Calculation deferred Risk of opioid abuse or dependence: 0.7-3.0% with doses ? 36 MME/day and 6.1-26% with doses ? 120 MME/day. Substance use disorder (SUD) risk level: Pending results of Medical Psychology Evaluation for SUD Opioid risk tool (ORT) (Total Score): 0     Opioid Risk Tool - 04/23/17 0929      Family History of Substance Abuse   Alcohol Negative   Illegal Drugs Negative   Rx Drugs Negative     Personal History of Substance Abuse   Alcohol Negative   Illegal Drugs Negative   Rx Drugs Negative     Age   Age between 50-45 years  No     History of Preadolescent Sexual Abuse   History of Preadolescent Sexual Abuse Negative or Female     Psychological Disease   Psychological Disease Negative  Depression Negative     Total Score   Opioid Risk Tool Scoring 0   Opioid Risk Interpretation Low Risk     ORT Scoring interpretation table:  Score <3 = Low Risk for SUD  Score between 4-7 = Moderate Risk for SUD  Score >8 = High Risk for Opioid Abuse     Pharmacologic Plan: Pending ordered tests and/or  consults            Initial impression: Pending review of available data and ordered tests.  Meds   Current Meds  Medication Sig  . amLODipine (NORVASC) 10 MG tablet Take 1 tablet (10 mg total) by mouth daily.  . carvedilol (COREG) 25 MG tablet Take 1 tablet (25 mg total) by mouth 2 (two) times daily with a meal.  . empagliflozin (JARDIANCE) 25 MG TABS tablet Take 25 mg by mouth daily.  . enalapril (VASOTEC) 20 MG tablet Take 1 tablet (20 mg total) by mouth 2 (two) times daily.  . furosemide (LASIX) 40 MG tablet Take 2 tablets (80 mg total) by mouth daily. (Patient taking differently: Take 40 mg by mouth daily. )  . glucose blood (RELION GLUCOSE TEST STRIPS) test strip Use as instructed  . ReliOn Ultra Thin Lancets MISC 1 each by Does not apply route daily.  . rosuvastatin (CRESTOR) 5 MG tablet Take 1 tablet (5 mg total) by mouth daily.   Current Facility-Administered Medications for the 04/23/17 encounter (Office Visit) with Gillis Santa, MD  Medication  . bupivacaine (PF) (MARCAINE) 0.25 % injection 30 mL  . ceFAZolin (ANCEF) IVPB 1 g/50 mL premix  . fentaNYL (SUBLIMAZE) injection 100 mcg  . lactated ringers infusion 1,000 mL  . lactated ringers infusion 1,000 mL  . lactated ringers infusion 1,000 mL  . lidocaine (PF) (XYLOCAINE) 1 % injection 10 mL  . midazolam (VERSED) 5 MG/5ML injection 5 mg  . orphenadrine (NORFLEX) injection 60 mg  . sodium chloride flush (NS) 0.9 % injection 20 mL  . triamcinolone acetonide (KENALOG-40) injection 40 mg    Imaging Review    No recent spine imaging in computer.  ROS  Cardiovascular History: Heart trouble and High blood pressure Pulmonary or Respiratory History: Snoring  Neurological History: No reported neurological signs or symptoms such as seizures, abnormal skin sensations, urinary and/or fecal incontinence, being born with an abnormal open spine and/or a tethered spinal cord Review of Past Neurological Studies: No results found for  this or any previous visit. Psychological-Psychiatric History: No reported psychological or psychiatric signs or symptoms such as difficulty sleeping, anxiety, depression, delusions or hallucinations (schizophrenial), mood swings (bipolar disorders) or suicidal ideations or attempts Gastrointestinal History: No reported gastrointestinal signs or symptoms such as vomiting or evacuating blood, reflux, heartburn, alternating episodes of diarrhea and constipation, inflamed or scarred liver, or pancreas or irrregular and/or infrequent bowel movements Genitourinary History: No reported renal or genitourinary signs or symptoms such as difficulty voiding or producing urine, peeing blood, non-functioning kidney, kidney stones, difficulty emptying the bladder, difficulty controlling the flow of urine, or chronic kidney disease Hematological History: No reported hematological signs or symptoms such as prolonged bleeding, low or poor functioning platelets, bruising or bleeding easily, hereditary bleeding problems, low energy levels due to low hemoglobin or being anemic Endocrine History: High blood sugar controlled without the use of insulin (NIDDM) Rheumatologic History: No reported rheumatological signs and symptoms such as fatigue, joint pain, tenderness, swelling, redness, heat, stiffness, decreased range of motion, with or without associated rash Musculoskeletal History: Negative  for myasthenia gravis, muscular dystrophy, multiple sclerosis or malignant hyperthermia Work History: Working full time  Allergies  Ms. Quattrone is allergic to prednisone.  Laboratory Chemistry  Inflammation Markers (CRP: Acute Phase) (ESR: Chronic Phase) No results found for: CRP, ESRSEDRATE               Renal Function Markers Lab Results  Component Value Date   BUN 19 04/08/2017   CREATININE 0.94 04/08/2017   GFRAA >89 06/19/2016   GFRNONAA 86 06/19/2016                 Hepatic Function Markers Lab Results  Component  Value Date   AST 13 04/08/2017   ALT 13 04/08/2017   ALBUMIN 4.1 04/08/2017   ALKPHOS 119 04/08/2017                 Electrolytes Lab Results  Component Value Date   NA 140 04/08/2017   K 4.2 04/08/2017   CL 103 04/08/2017   CALCIUM 9.3 04/08/2017                 Neuropathy Markers No results found for: [id number]               Bone Pathology Markers Lab Results  Component Value Date   ALKPHOS 119 04/08/2017   CALCIUM 9.3 04/08/2017                 Coagulation Parameters Lab Results  Component Value Date   PLT 533 (H) 06/19/2016                 Cardiovascular Markers Lab Results  Component Value Date   HGB 13.0 06/19/2016   HCT 40.0 06/19/2016                 Note: Lab results reviewed.  PFSH  Drug: Ms. Dorning  reports that she does not use drugs. Alcohol:  reports that she does not drink alcohol. Tobacco:  reports that she has never smoked. She has never used smokeless tobacco. Medical:  has a past medical history of Allergy; Arthritis; Diabetes mellitus without complication ([place]); Enlarged heart; and Hypertension. Family: family history includes Cancer in her father; Diabetes in her mother; Mental illness in her son.  Past Surgical History:  Procedure Laterality Date  . COLON SURGERY    . HERNIA REPAIR    . WRIST FRACTURE SURGERY Right    Active Ambulatory Problems    Diagnosis Date Noted  . Bowel obstruction ([place]) 12/19/2013  . BP (high blood pressure) 04/18/2015  . DM type 2 with diabetic peripheral neuropathy ([place]) 04/18/2015  . Wound of skin 04/18/2015  . Hyperlipidemia associated with type 2 diabetes mellitus ([place]) 11/30/2015  . DDD (degenerative disc disease), lumbar 11/30/2015  . Cardiomyopathy ([place]) 11/30/2015  . Arthritis 11/30/2015  . Facet syndrome, lumbar ([place]) 01/15/2016  . Diabetic neuropathy ([place]) 01/15/2016  . DJD (degenerative joint disease) of knee 01/15/2016  . Sacroiliac joint dysfunction 01/15/2016   Resolved Ambulatory  Problems    Diagnosis Date Noted  . No Resolved Ambulatory Problems   Past Medical History:  Diagnosis Date  . Allergy   . Arthritis   . Diabetes mellitus without complication ([place])   . Enlarged heart   . Hypertension    Constitutional Exam  General appearance: Well nourished, well developed, and well hydrated. In no apparent acute distress Vitals:   04/23/17 0922  BP: (!) 157/83  Pulse: 70  Resp: 18  Temp: 98.2 F (  36.8 C)  TempSrc: Oral  SpO2: (!) 9%  Weight: 240 lb (108.9 kg)  Height: '5\' 2"'$  (1.575 m)   BMI Assessment: Estimated body mass index is 43.9 kg/m as calculated from the following:   Height as of this encounter: '5\' 2"'$  (1.575 m).   Weight as of this encounter: 240 lb (108.9 kg).  BMI interpretation table: BMI level Category Range association with higher incidence of chronic pain  <18 kg/m2 Underweight   18.5-24.9 kg/m2 Ideal body weight   25-29.9 kg/m2 Overweight Increased incidence by 20%  30-34.9 kg/m2 Obese (Class I) Increased incidence by 68%  35-39.9 kg/m2 Severe obesity (Class II) Increased incidence by 136%  >40 kg/m2 Extreme obesity (Class III) Increased incidence by 254%   BMI Readings from Last 4 Encounters:  04/23/17 43.90 kg/m  04/02/17 44.56 kg/m  06/20/16 44.63 kg/m  04/21/16 45.73 kg/m   Wt Readings from Last 4 Encounters:  04/23/17 240 lb (108.9 kg)  04/02/17 243 lb 9.6 oz (110.5 kg)  06/20/16 244 lb (110.7 kg)  04/21/16 250 lb (113.4 kg)  Psych/Mental status: Alert, oriented x 3 (person, place, & time)       Eyes: PERLA Respiratory: No evidence of acute respiratory distress  Cervical Spine Area Exam  Skin & Axial Inspection: No masses, redness, edema, swelling, or associated skin lesions Alignment: Symmetrical Functional ROM: Unrestricted ROM      Stability: No instability detected Muscle Tone/Strength: Functionally intact. No obvious neuro-muscular anomalies detected. Sensory (Neurological): Unimpaired Palpation: No  palpable anomalies              Upper Extremity (UE) Exam    Side: Right upper extremity  Side: Left upper extremity  Skin & Extremity Inspection: Skin color, temperature, and hair growth are WNL. No peripheral edema or cyanosis. No masses, redness, swelling, asymmetry, or associated skin lesions. No contractures.  Skin & Extremity Inspection: Skin color, temperature, and hair growth are WNL. No peripheral edema or cyanosis. No masses, redness, swelling, asymmetry, or associated skin lesions. No contractures.  Functional ROM: Unrestricted ROM          Functional ROM: Unrestricted ROM          Muscle Tone/Strength: Functionally intact. No obvious neuro-muscular anomalies detected.  Muscle Tone/Strength: Functionally intact. No obvious neuro-muscular anomalies detected.  Sensory (Neurological): Unimpaired  Sensory (Neurological): Unimpaired  Palpation: No palpable anomalies              Palpation: No palpable anomalies              Specialized Test(s): Deferred         Specialized Test(s): Deferred          Thoracic Spine Area Exam  Skin & Axial Inspection: No masses, redness, or swelling Alignment: Symmetrical Functional ROM: Unrestricted ROM Stability: No instability detected Muscle Tone/Strength: Functionally intact. No obvious neuro-muscular anomalies detected. Sensory (Neurological): Unimpaired Muscle strength & Tone: No palpable anomalies  Lumbar Spine Area Exam  Skin & Axial Inspection: No masses, redness, or swelling Alignment: Asymmetric Functional ROM: Unrestricted ROM      Stability: No instability detected Muscle Tone/Strength: Functionally intact. No obvious neuro-muscular anomalies detected. Sensory (Neurological): Movement-associated pain Palpation: Complains of area being tender to palpation       Provocative Tests: Lumbar Hyperextension and rotation test: Positive       Lumbar Lateral bending test: Positive       Patrick's Maneuver: Positive  Gait &  Posture Assessment  Ambulation: Unassisted Gait: Relatively normal for age and body habitus Posture: WNL   Lower Extremity Exam    Side: Right lower extremity  Side: Left lower extremity  Skin & Extremity Inspection: Skin color, temperature, and hair growth are WNL. No peripheral edema or cyanosis. No masses, redness, swelling, asymmetry, or associated skin lesions. No contractures.  Skin & Extremity Inspection: Skin color, temperature, and hair growth are WNL. No peripheral edema or cyanosis. No masses, redness, swelling, asymmetry, or associated skin lesions. No contractures.  Functional ROM: Limited knee extension and knee flexion bilaterally, primarily limited by pain.          Functional ROM: Decreased ROM          Muscle Tone/Strength: Functionally intact. No obvious neuro-muscular anomalies detected.  Muscle Tone/Strength: Functionally intact. No obvious neuro-muscular anomalies detected.  Sensory (Neurological): Arthropathic arthralgia  Sensory (Neurological): Movement-associated pain  Palpation: Medial patellar regions tender to palpation bilaterally  Palpation: Complains of area being tender to palpation   Assessment  Primary Diagnosis & Pertinent Problem List: The primary encounter diagnosis was Spondylosis without myelopathy or radiculopathy, lumbar region. Diagnoses of Diabetic mononeuropathy associated with diabetes mellitus due to underlying condition ([place]), DM type 2 with diabetic peripheral neuropathy ([place]), Cardiomyopathy, unspecified type ([place]), DDD (degenerative disc disease), lumbar, Arthritis, Facet syndrome, lumbar ([place]), Primary osteoarthritis of both knees, and Sacroiliac joint dysfunction were also pertinent to this visit.  Visit Diagnosis (New problems to examiner): 1. Spondylosis without myelopathy or radiculopathy, lumbar region   2. Diabetic mononeuropathy associated with diabetes mellitus due to underlying condition ([place])   3. DM type 2 with diabetic peripheral  neuropathy (H[facility])   4. Cardiomyopathy, unspecified type ([place])   5. DDD (degenerative disc disease), lumbar   6. Arthritis   7. Facet syndrome, lumbar ([place])   8. Primary osteoarthritis of both knees   9. Sacroiliac joint dysfunction    Plan of Care (Initial workup plan)  Note: Please be advised that as per protocol, today's visit has been an evaluation only. We have not taken over the patient's controlled substance management.   Patient is a 61 year old female with a past medical history of cardiomyopathy, hypertension, hyperlipidemia with chronic low back and hip pain secondary to lumbar spondylosis without myelopathy, lumbar spinal stenosis, degenerative joint disease of bilateral knees and SI joint arthralgia. Patient's last creatinine was 0.94. Patient previously has had epidural steroid injection which helped. Her last epidural steroid injection was 04/14/2016.  Today we discussed lumbar epidural steroid injections, lumbar medial branch blocks with goal radiofrequency ablation, SI joint injections and genicular nerve blocks to target her low back pain, hip pain and knee pain respectively. Given that the patient does have pain with facet loading and lumbar extension and pain that doesn't radiate past her knees, this is likely secondary to her lumbar spondylosis or SI joint arthritis. We will start with diagnostic lumbar medial branch blocks at L3-L5 on the right and hopefully proceed  proceed with radiofrequency ablation if positive results from diagnostic blocks.  The patient prefers non-opioid medication management which I agree with. I'll prescribe her meloxicam 7.5 mg twice a day for 14 days, Voltaren gel as she can apply to her low back and knees, tizanidine 4 mg twice a day when necessary muscle spasms. Patient also benefit from physical therapy, specifically [facility] therapy and aquatic therapy to help spine rehabilitation and increased range of motion. Patient does work every day so I  recommended that she  go for a couple of sessions and obtain exercises that she can do at home for stretching and range of motion improvement.   Patient does not have any recent imaging in the computer. I would like to get lumbar, hip, SI joint, bilateral knee x-rays.  Plan: 1. X-rays: Lumbar spine, hip, SI joint, bilateral knees 2. Schedule for right diagnostic L3-L5 lumbar medial branch nerve block. 3. Meloxicam 7.5 g twice a day 14 days. Patient instructed to take this after eating. Most recent kidney function within normal limits. 4. Alternative gel the patient apply to her low back and knees. 5. Tizanidine 4 mg twice a day when necessary 6. Referral to physical therapy, aquatic therapy and McKenzie technique for spine rehabilitation and improve range of motion.  Ordered Lab-work, Procedure(s), Referral(s), & Consult(s): Orders Placed This Encounter  Procedures  . LUMBAR FACET(MEDIAL BRANCH NERVE BLOCK) MBNB  . DG Lumbar Spine Complete W/Bend  . DG Si Joints  . DG HIP UNILAT W OR W/O PELVIS 2-3 VIEWS LEFT  . DG HIP UNILAT W OR W/O PELVIS 2-3 VIEWS RIGHT  . DG Knee 1-2 Views Right  . DG Knee 1-2 Views Left  . Ambulatory referral to Physical Therapy   Pharmacotherapy (current): Medications ordered:  Meds ordered this encounter  Medications  . meloxicam (MOBIC) 15 MG tablet    Sig: Take 0.5 tablets (7.5 mg total) by mouth 2 (two) times daily.    Dispense:  14 tablet    Refill:  0    Do not add this medication to the electronic "Automatic Refill" notification system. Patient may have prescription filled one day early if pharmacy is closed on scheduled refill date.  Marland Kitchen tiZANidine (ZANAFLEX) 4 MG capsule    Sig: Take 1 capsule (4 mg total) by mouth 2 (two) times daily as needed for muscle spasms.    Dispense:  60 capsule    Refill:  0    Do not place this medication, or any other prescription from our practice, on "Automatic Refill". Patient may have prescription filled one day  early if pharmacy is closed on scheduled refill date.  . diclofenac sodium (VOLTAREN) 1 % GEL    Sig: Apply 4 g topically 4 (four) times daily.    Dispense:  100 g    Refill:  2    Do not add this medication to the electronic "Automatic Refill" notification system. Patient may have prescription filled one day early if pharmacy is closed on scheduled refill date.   Medications administered during this visit: Ms. Waldroup had no medications administered during this visit.   Pharmacological management options:  Opioid Analgesics: The patient was informed that there is no guarantee that she would be a candidate for opioid analgesics. The decision will be made following CDC guidelines. This decision will be based on the results of diagnostic studies, as well as Ms. Devincentis's risk profile.   Membrane stabilizer: Consider gabapentin, Lyrica, Topamax  Muscle relaxant: Trial of tizanidine now. Can consider baclofen, Robaxin. Patient states she became sedated with Flexeril.  NSAID: Trial of meloxicam for 14 days. Can consider diclofenac trial the future. Do not recommend chronic NSAID therapy. Check creatinine before prescription of NSAIDs.  Other analgesic(s): Voltaren gel trial today. Consider TENS, lidocaine ointment, lidocaine patches.   Interventional management options: Ms. Shepherd was informed that there is no guarantee that she would be a candidate for interventional therapies. The decision will be based on the results of diagnostic studies, as well as Ms. Nemetz's  risk profile.  Procedure(s) under consideration:  -Right L3-L5 medial branch nerve block with goal greater frequency ablation -Lumbar epidural steroid injection -SI joint injection -piriformis injection -Bilateral genicular nerves block    Provider-requested follow-up: Return for Procedure.  Future Appointments Date Time Provider [place]  05/07/2017 9:00 AM Mikey College, NP [facility] None  07/21/2017 8:00 AM Mikey College, NP [facility] None    Primary Care Physician: Mikey College, NP Location: [facility] Note by: Gillis Santa, M.D, Date: 04/23/2017; Time: 10:43 AM  Patient Instructions  It was nice meeting you today. Today we did the following:  #1. We will obtain x-rays of your lumbar spine, hips, knees bilaterally.  #2. I will prescribe you meloxicam 7.5 mg twice a day for 14 days. Please take this after you have eaten. This is an anti-inflammatory.  #3. Voltaren gel that she can apply to your knees.  #4. Tizanidine 4 mg twice a day when necessary muscle spasms.  #5. Referral to physical therapy.   #6. Right L3, L4, L5 medial branch nerve block Facet Blocks Patient Information  Description: The facets are joints in the spine between the vertebrae.  Like any joints in the body, facets can become irritated and painful.  Arthritis can also effect the facets.  By injecting steroids and local anesthetic in and around these joints, we can temporarily block the nerve supply to them.  Steroids act directly on irritated nerves and tissues to reduce selling and inflammation which often leads to decreased pain.  Facet blocks may be done anywhere along the spine from the neck to the low back depending upon the location of your pain.   After numbing the skin with local anesthetic (like Novocaine), a small needle is passed onto the facet joints under x-ray guidance.  You may experience a sensation of pressure while this is being done.  The entire block usually lasts about 15-25 minutes.   Conditions which may be treated by facet blocks:   Low back/buttock pain  Neck/shoulder pain  Certain types of headaches  Preparation for the injection:  1. Do not eat any solid food or dairy products within 8 hours of your appointment. 2. You may drink clear liquid up to 3 hours before appointment.  Clear liquids include water, black coffee, juice or soda.  No milk or  cream please. 3. You may take your regular medication, including pain medications, with a sip of water before your appointment.  Diabetics should hold regular insulin (if taken separately) and take 1/2 normal NPH dose the morning of the procedure.  Carry some sugar containing items with you to your appointment. 4. A driver must accompany you and be prepared to drive you home after your procedure. 5. Bring all your current medications with you. 6. An IV may be inserted and sedation may be given at the discretion of the physician. 7. A blood pressure cuff, EKG and other monitors will often be applied during the procedure.  Some patients may need to have extra oxygen administered for a short period. 8. You will be asked to provide medical information, including your allergies and medications, prior to the procedure.  We must know immediately if you are taking blood thinners (like Coumadin/Warfarin) or if you are allergic to IV iodine contrast (dye).  We must know if you could possible be pregnant.  Possible side-effects:   Bleeding from needle site  Infection (rare, may require surgery)  Nerve injury (rare)  Numbness &  tingling (temporary)  Difficulty urinating (rare, temporary)  Spinal headache (a headache worse with upright posture)  Light-headedness (temporary)  Pain at injection site (serveral days)  Decreased blood pressure (rare, temporary)  Weakness in arm/leg (temporary)  Pressure sensation in back/neck (temporary)   Call if you experience:   Fever/chills associated with headache or increased back/neck pain  Headache worsened by an upright position  New onset, weakness or numbness of an extremity below the injection site  Hives or difficulty breathing (go to the emergency room)  Inflammation or drainage at the injection site(s)  Severe back/neck pain greater than usual  New symptoms which are concerning to you  Please note:  Although the local anesthetic  injected can often make your back or neck feel good for several hours after the injection, the pain will likely return. It takes 3-7 days for steroids to work.  You may not notice any pain relief for at least one week.  If effective, we will often do a series of 2-3 injections spaced 3-6 weeks apart to maximally decrease your pain.  After the initial series, you may be a candidate for a more permanent nerve block of the facets.  If you have any questions, please call #[phone]) [facility]

## 2017-04-23 NOTE — Patient Instructions (Addendum)
It was nice meeting you today. Today we did the following:  #1. We will obtain x-rays of your lumbar spine, hips, knees bilaterally.  #2. I will prescribe you meloxicam 7.5 mg twice a day for 14 days. Please take this after you have eaten. This is an anti-inflammatory.  #3. Voltaren gel that she can apply to your knees.  #4. Tizanidine 4 mg twice a day when necessary muscle spasms.  #5. Referral to physical therapy.   #6. Right L3, L4, L5 medial branch nerve block Facet Blocks Patient Information  Description: The facets are joints in the spine between the vertebrae.  Like any joints in the body, facets can become irritated and painful.  Arthritis can also effect the facets.  By injecting steroids and local anesthetic in and around these joints, we can temporarily block the nerve supply to them.  Steroids act directly on irritated nerves and tissues to reduce selling and inflammation which often leads to decreased pain.  Facet blocks may be done anywhere along the spine from the neck to the low back depending upon the location of your pain.   After numbing the skin with local anesthetic (like Novocaine), a small needle is passed onto the facet joints under x-ray guidance.  You may experience a sensation of pressure while this is being done.  The entire block usually lasts about 15-25 minutes.   Conditions which may be treated by facet blocks:   Low back/buttock pain  Neck/shoulder pain  Certain types of headaches  Preparation for the injection:  1. Do not eat any solid food or dairy products within 8 hours of your appointment. 2. You may drink clear liquid up to 3 hours before appointment.  Clear liquids include water, black coffee, juice or soda.  No milk or cream please. 3. You may take your regular medication, including pain medications, with a sip of water before your appointment.  Diabetics should hold regular insulin (if taken separately) and take 1/2 normal NPH dose the morning  of the procedure.  Carry some sugar containing items with you to your appointment. 4. A driver must accompany you and be prepared to drive you home after your procedure. 5. Bring all your current medications with you. 6. An IV may be inserted and sedation may be given at the discretion of the physician. 7. A blood pressure cuff, EKG and other monitors will often be applied during the procedure.  Some patients may need to have extra oxygen administered for a short period. 8. You will be asked to provide medical information, including your allergies and medications, prior to the procedure.  We must know immediately if you are taking blood thinners (like Coumadin/Warfarin) or if you are allergic to IV iodine contrast (dye).  We must know if you could possible be pregnant.  Possible side-effects:   Bleeding from needle site  Infection (rare, may require surgery)  Nerve injury (rare)  Numbness & tingling (temporary)  Difficulty urinating (rare, temporary)  Spinal headache (a headache worse with upright posture)  Light-headedness (temporary)  Pain at injection site (serveral days)  Decreased blood pressure (rare, temporary)  Weakness in arm/leg (temporary)  Pressure sensation in back/neck (temporary)   Call if you experience:   Fever/chills associated with headache or increased back/neck pain  Headache worsened by an upright position  New onset, weakness or numbness of an extremity below the injection site  Hives or difficulty breathing (go to the emergency room)  Inflammation or drainage at the injection site(s)  Severe back/neck pain greater than usual  New symptoms which are concerning to you  Please note:  Although the local anesthetic injected can often make your back or neck feel good for several hours after the injection, the pain will likely return. It takes 3-7 days for steroids to work.  You may not notice any pain relief for at least one week.  If effective,  we will often do a series of 2-3 injections spaced 3-6 weeks apart to maximally decrease your pain.  After the initial series, you may be a candidate for a more permanent nerve block of the facets.  If you have any questions, please call #[phone]) [facility]

## 2017-04-23 NOTE — Progress Notes (Signed)
Safety precautions to be maintained throughout the outpatient stay will include: orient to surroundings, keep bed in low position, maintain call bell within reach at all times, provide assistance with transfer out of bed and ambulation.  

## 2017-04-28 ENCOUNTER — Ambulatory Visit
Admission: RE | Admit: 2017-04-28 | Discharge: 2017-04-28 | Disposition: A | Discharge status: Home / Self Care | Payer: BLUE CROSS/BLUE SHIELD | Source: Ambulatory Visit | Attending: Student in an Organized Health Care Education/Training Program | Admitting: Student in an Organized Health Care Education/Training Program

## 2017-04-28 DIAGNOSIS — M5137 Other intervertebral disc degeneration, lumbosacral region: Secondary | ICD-10-CM | POA: Insufficient documentation

## 2017-04-28 DIAGNOSIS — M533 Sacrococcygeal disorders, not elsewhere classified: Secondary | ICD-10-CM

## 2017-04-28 DIAGNOSIS — M5136 Other intervertebral disc degeneration, lumbar region: Secondary | ICD-10-CM | POA: Diagnosis not present

## 2017-04-28 DIAGNOSIS — M170 Bilateral primary osteoarthritis of knee: Secondary | ICD-10-CM | POA: Diagnosis present

## 2017-04-28 DIAGNOSIS — M4316 Spondylolisthesis, lumbar region: Secondary | ICD-10-CM | POA: Diagnosis not present

## 2017-04-28 DIAGNOSIS — M47816 Spondylosis without myelopathy or radiculopathy, lumbar region: Secondary | ICD-10-CM

## 2017-04-28 DIAGNOSIS — M160 Bilateral primary osteoarthritis of hip: Secondary | ICD-10-CM | POA: Diagnosis not present

## 2017-05-06 ENCOUNTER — Ambulatory Visit
Admission: RE | Admit: 2017-05-06 | Discharge: 2017-05-06 | Disposition: A | Discharge status: Home / Self Care | Payer: BLUE CROSS/BLUE SHIELD | Source: Ambulatory Visit | Attending: Student in an Organized Health Care Education/Training Program | Admitting: Student in an Organized Health Care Education/Training Program

## 2017-05-06 ENCOUNTER — Ambulatory Visit (HOSPITAL_BASED_OUTPATIENT_CLINIC_OR_DEPARTMENT_OTHER): Payer: BLUE CROSS/BLUE SHIELD | Admitting: Student in an Organized Health Care Education/Training Program

## 2017-05-06 ENCOUNTER — Encounter: Payer: Self-pay | Admitting: Student in an Organized Health Care Education/Training Program

## 2017-05-06 VITALS — BP 115/53 | HR 69 | Temp 97.6°F | Resp 15 | Ht 62.0 in | Wt 240.0 lb

## 2017-05-06 DIAGNOSIS — E0841 Diabetes mellitus due to underlying condition with diabetic mononeuropathy: Secondary | ICD-10-CM

## 2017-05-06 DIAGNOSIS — M549 Dorsalgia, unspecified: Secondary | ICD-10-CM | POA: Insufficient documentation

## 2017-05-06 DIAGNOSIS — M5136 Other intervertebral disc degeneration, lumbar region: Secondary | ICD-10-CM | POA: Insufficient documentation

## 2017-05-06 DIAGNOSIS — M51369 Other intervertebral disc degeneration, lumbar region without mention of lumbar back pain or lower extremity pain: Secondary | ICD-10-CM

## 2017-05-06 DIAGNOSIS — E1142 Type 2 diabetes mellitus with diabetic polyneuropathy: Secondary | ICD-10-CM

## 2017-05-06 DIAGNOSIS — M47816 Spondylosis without myelopathy or radiculopathy, lumbar region: Secondary | ICD-10-CM | POA: Diagnosis not present

## 2017-05-06 DIAGNOSIS — M170 Bilateral primary osteoarthritis of knee: Secondary | ICD-10-CM

## 2017-05-06 DIAGNOSIS — M25551 Pain in right hip: Secondary | ICD-10-CM | POA: Insufficient documentation

## 2017-05-06 DIAGNOSIS — I429 Cardiomyopathy, unspecified: Secondary | ICD-10-CM | POA: Diagnosis not present

## 2017-05-06 DIAGNOSIS — M48062 Spinal stenosis, lumbar region with neurogenic claudication: Secondary | ICD-10-CM | POA: Diagnosis not present

## 2017-05-06 DIAGNOSIS — M25552 Pain in left hip: Secondary | ICD-10-CM | POA: Insufficient documentation

## 2017-05-06 DIAGNOSIS — Z889 Allergy status to unspecified drugs, medicaments and biological substances status: Secondary | ICD-10-CM | POA: Diagnosis not present

## 2017-05-06 DIAGNOSIS — M533 Sacrococcygeal disorders, not elsewhere classified: Secondary | ICD-10-CM

## 2017-05-06 DIAGNOSIS — M1990 Unspecified osteoarthritis, unspecified site: Secondary | ICD-10-CM

## 2017-05-06 DIAGNOSIS — E1141 Type 2 diabetes mellitus with diabetic mononeuropathy: Secondary | ICD-10-CM | POA: Diagnosis not present

## 2017-05-06 DIAGNOSIS — M4696 Unspecified inflammatory spondylopathy, lumbar region: Secondary | ICD-10-CM

## 2017-05-06 MED ORDER — LACTATED RINGERS IV SOLN
1000.0000 mL | Freq: Once | INTRAVENOUS | Status: AC
  Administered 2017-05-06: 1000 mL via INTRAVENOUS

## 2017-05-06 MED ORDER — FENTANYL CITRATE (PF) 100 MCG/2ML IJ SOLN
25.0000 ug | INTRAMUSCULAR | Status: DC | PRN
Start: 2017-05-06 — End: 2017-05-06
  Administered 2017-05-06: 50 ug via INTRAVENOUS
  Filled 2017-05-06: qty 2

## 2017-05-06 MED ORDER — ROPIVACAINE HCL 2 MG/ML IJ SOLN
9.0000 mL | Freq: Once | INTRAMUSCULAR | Status: AC
  Administered 2017-05-06: 10 mL
  Filled 2017-05-06: qty 10

## 2017-05-06 MED ORDER — LIDOCAINE HCL 1 % IJ SOLN
10.0000 mL | Freq: Once | INTRAMUSCULAR | Status: AC
  Administered 2017-05-06: 5 mL
  Filled 2017-05-06: qty 10

## 2017-05-06 NOTE — Progress Notes (Signed)
Patient's Name: Stacy Martinez  MRN: [id number]  Referring Provider: Mikey College, *  DOB: February 26, 1956  PCP: Mikey College, NP  DOS: 05/06/2017  Note by: Gillis Santa, MD  Service setting: Ambulatory outpatient  Specialty: Interventional Pain Management  Patient type: Established  Location: [facility] (AMB) Pain Management Facility  Visit type: Interventional Procedure   Primary Reason for Visit: Interventional Pain Management Treatment. CC: Back Pain (low); Hip Pain (bilateral); and Knee Pain (bilateral)  Procedure:  Anesthesia, Analgesia, Anxiolysis:  Type: Diagnostic Medial Branch Facet Block Region: Lumbar Level:  L3, L4, L5, Medial Branch Level(s) Laterality: Bilateral  Type: Local Anesthesia with Moderate (Conscious) Sedation Local Anesthetic: Lidocaine 1% Route: Intravenous (IV) IV Access: Secured Sedation: Meaningful verbal contact was maintained at all times during the procedure  Indication(s): Analgesia and Anxiety  Indications: 1. Spondylosis without myelopathy or radiculopathy, lumbar region   2. Diabetic mononeuropathy associated with diabetes mellitus due to underlying condition ([place])   3. DM type 2 with diabetic peripheral neuropathy ([facility])   4. Cardiomyopathy, unspecified type ([place])   5. DDD (degenerative disc disease), lumbar   6. Arthritis   7. Facet syndrome, lumbar ([place])   8. Primary osteoarthritis of both knees   9. Sacroiliac joint dysfunction   10. Spinal stenosis, lumbar region, with neurogenic claudication    Pain Score: Pre-procedure: 4 /10 Post-procedure: 0-No pain/10  Patient with worsening chronic axial low back pain that radiates to bilateral buttocks and posterior thighs. Patient previously was endorsing right sided pain but states that over the last week she is having bilateral pain and wants to have both sides injected. She does have bilateral low back and buttock pain with facet loading and lumbar extension.  Pre-op Assessment:  Stacy Martinez is a  61 y.o. (year old), female patient, seen today for interventional treatment. She  has a past surgical history that includes Colon surgery; Hernia repair; and Wrist fracture surgery (Right). Stacy Martinez has a current medication list which includes the following prescription(s): amlodipine, carvedilol, diclofenac sodium, empagliflozin, enalapril, exenatide er, furosemide, glucose blood, meloxicam, relion ultra thin lancets, rosuvastatin, tizanidine, and tramadol, and the following Facility-Administered Medications: bupivacaine (pf), cefazolin, fentanyl, fentanyl, lactated ringers, lactated ringers, lactated ringers, lidocaine (pf), midazolam, orphenadrine, sodium chloride flush, and triamcinolone acetonide. Her primarily concern today is the Back Pain (low); Hip Pain (bilateral); and Knee Pain (bilateral)  Initial Vital Signs: Blood pressure (!) 115/53, pulse 69, temperature 97.6 F (36.4 C), resp. rate 15, height 5\' 2"  (1.575 m), weight 240 lb (108.9 kg), SpO2 97 %. BMI: Estimated body mass index is 43.9 kg/m as calculated from the following:   Height as of this encounter: 5\' 2"  (1.575 m).   Weight as of this encounter: 240 lb (108.9 kg).  Risk Assessment: Allergies: Reviewed. She is allergic to prednisone.  Allergy Precautions: None required Coagulopathies: Reviewed. None identified.  Blood-thinner therapy: None at this time Active Infection(s): Reviewed. None identified. Stacy Martinez is afebrile  Site Confirmation: Stacy Martinez was asked to confirm the procedure and laterality before marking the site Procedure checklist: Completed Consent: Before the procedure and under the influence of no sedative(s), amnesic(s), or anxiolytics, the patient was informed of the treatment options, risks and possible complications. To fulfill our ethical and legal obligations, as recommended by the American Medical Association's Code of Ethics, I have informed the patient of my clinical impression; the nature and purpose of  the treatment or procedure; the risks, benefits, and possible complications of the intervention; the alternatives,  including doing nothing; the risk(s) and benefit(s) of the alternative treatment(s) or procedure(s); and the risk(s) and benefit(s) of doing nothing. The patient was provided information about the general risks and possible complications associated with the procedure. These may include, but are not limited to: failure to achieve desired goals, infection, bleeding, organ or nerve damage, allergic reactions, paralysis, and death. In addition, the patient was informed of those risks and complications associated to Spine-related procedures, such as failure to decrease pain; infection (i.e.: Meningitis, epidural or intraspinal abscess); bleeding (i.e.: epidural hematoma, subarachnoid hemorrhage, or any other type of intraspinal or peri-dural bleeding); organ or nerve damage (i.e.: Any type of peripheral nerve, nerve root, or spinal cord injury) with subsequent damage to sensory, motor, and/or autonomic systems, resulting in permanent pain, numbness, and/or weakness of one or several areas of the body; allergic reactions; (i.e.: anaphylactic reaction); and/or death. Furthermore, the patient was informed of those risks and complications associated with the medications. These include, but are not limited to: allergic reactions (i.e.: anaphylactic or anaphylactoid reaction(s)); adrenal axis suppression; blood sugar elevation that in diabetics may result in ketoacidosis or comma; water retention that in patients with history of congestive heart failure may result in shortness of breath, pulmonary edema, and decompensation with resultant heart failure; weight gain; swelling or edema; medication-induced neural toxicity; particulate matter embolism and blood vessel occlusion with resultant organ, and/or nervous system infarction; and/or aseptic necrosis of one or more joints. Finally, the patient was informed  that Medicine is not an exact science; therefore, there is also the possibility of unforeseen or unpredictable risks and/or possible complications that may result in a catastrophic outcome. The patient indicated having understood very clearly. We have given the patient no guarantees and we have made no promises. Enough time was given to the patient to ask questions, all of which were answered to the patient's satisfaction. Stacy Martinez has indicated that she wanted to continue with the procedure. Attestation: I, the ordering provider, attest that I have discussed with the patient the benefits, risks, side-effects, alternatives, likelihood of achieving goals, and potential problems during recovery for the procedure that I have provided informed consent. Date: 05/06/2017; Time: 4:08 PM  Pre-Procedure Preparation:  Monitoring: As per clinic protocol. Respiration, ETCO2, SpO2, BP, heart rate and rhythm monitor placed and checked for adequate function Safety Precautions: Patient was assessed for positional comfort and pressure points before starting the procedure. Time-out: I initiated and conducted the "Time-out" before starting the procedure, as per protocol. The patient was asked to participate by confirming the accuracy of the "Time Out" information. Verification of the correct person, site, and procedure were performed and confirmed by me, the nursing staff, and the patient. "Time-out" conducted as per Joint Commission's Universal Protocol (UP.01.01.01). "Time-out" Date & Time: 05/06/2017; 1203 hrs.  Description of Procedure Process:   Position: Prone Target Area: For Lumbar Facet blocks, the target is the groove formed by the junction of the transverse process and superior articular process. For the L5 dorsal ramus, the target is the notch between superior articular process and sacral ala.  Approach: Paramedial approach. Area Prepped: Entire Posterior Lumbosacral Region Prepping solution: ChloraPrep (2%  chlorhexidine gluconate and 70% isopropyl alcohol) Safety Precautions: Aspiration looking for blood return was conducted prior to all injections. At no point did we inject any substances, as a needle was being advanced. No attempts were made at seeking any paresthesias. Safe injection practices and needle disposal techniques used. Medications properly checked for expiration dates. SDV (single  dose vial) medications used. Description of the Procedure: Protocol guidelines were followed. The patient was placed in position over the fluoroscopy table. The target area was identified and the area prepped in the usual manner. Skin desensitized using vapocoolant spray. Skin & deeper tissues infiltrated with local anesthetic. Appropriate amount of time allowed to pass for local anesthetics to take effect. The procedure needle was introduced through the skin, ipsilateral to the reported pain, and advanced to the target area. Employing the "Medial Branch Technique", the needles were advanced to the angle made by the superior and medial portion of the transverse process, and the lateral and inferior portion of the superior articulating process of the targeted vertebral bodies. This area is known as "Burton's Eye" or the "Eye of the [place] Dog". A procedure needle was introduced through the skin, and this time advanced to the angle made by the superior and medial border of the sacral ala, and the lateral border of the S1 vertebral body. Negative aspiration confirmed. Solution injected in intermittent fashion, asking for systemic symptoms every 0.5cc of injectate. The needles were then removed and the area cleansed, making sure to leave some of the prepping solution back to take advantage of its long term bactericidal properties.   Illustration of the posterior view of the lumbar spine and the posterior neural structures. Laminae of L2 through S1 are labeled. DPRL5, dorsal primary ramus of L5; DPRS1, dorsal primary ramus of  S1; DPR3, dorsal primary ramus of L3; FJ, facet (zygapophyseal) joint L3-L4; I, inferior articular process of L4; LB1, lateral branch of dorsal primary ramus of L1; IAB, inferior articular branches from L3 medial branch (supplies L4-L5 facet joint); IBP, intermediate branch plexus; MB3, medial branch of dorsal primary ramus of L3; NR3, third lumbar nerve root; S, superior articular process of L5; SAB, superior articular branches from L4 (supplies L4-5 facet joint also); TP3, transverse process of L3.  Vitals:   05/06/17 1227 05/06/17 1236 05/06/17 1246 05/06/17 1256  BP: 128/60 (!) 100/49 (!) 101/57 (!) 115/53  Pulse: 68 66 70 69  Resp: 15 13 16 15   Temp:  97.9 F (36.6 C)  97.6 F (36.4 C)  TempSrc:      SpO2: 96% 97% 98% 97%  Weight:      Height:        Start Time: 1210 hrs. End Time:   hrs. Materials:  Needle(s) Type: Regular needle Gauge: 22G Length: 3.5-in Medication(s): We administered lactated ringers, fentaNYL, ropivacaine (PF) 2 mg/mL (0.2%), and lidocaine. Please see chart orders for dosing details. 1.5 cc of 0.2% ropivacaine at each level Imaging Guidance (Spinal):  Type of Imaging Technique: Fluoroscopy Guidance (Spinal) Indication(s): Assistance in needle guidance and placement for procedures requiring needle placement in or near specific anatomical locations not easily accessible without such assistance. Exposure Time: Please see nurses notes. Contrast: None used. Fluoroscopic Guidance: I was personally present during the use of fluoroscopy. "Tunnel Vision Technique" used to obtain the best possible view of the target area. Parallax error corrected before commencing the procedure. "Direction-depth-direction" technique used to introduce the needle under continuous pulsed fluoroscopy. Once target was reached, antero-posterior, oblique, and lateral fluoroscopic projection used confirm needle placement in all planes. Images permanently stored in EMR. Interpretation: No contrast  injected. I personally interpreted the imaging intraoperatively. Adequate needle placement confirmed in multiple planes. Permanent images saved into the patient's record.  Antibiotic Prophylaxis:  Indication(s): None identified Antibiotic given: None  Post-operative Assessment:  EBL: None Complications: No immediate post-treatment complications  observed by team, or reported by patient. Note: The patient tolerated the entire procedure well. A repeat set of vitals were taken after the procedure and the patient was kept under observation following institutional policy, for this type of procedure. Post-procedural neurological assessment was performed, showing return to baseline, prior to discharge. The patient was provided with post-procedure discharge instructions, including a section on how to identify potential problems. Should any problems arise concerning this procedure, the patient was given instructions to immediately contact us, at any time, without hesitation. In any case, we plan to contact the patient by telephone for a follow-up status report regarding this interventional procedure. Comments:  No additional relevant information.  Plan of Care  Disposition: Discharge home  Discharge Date & Time: 05/06/2017; 1305 hrs.  5 out of 5 strength bilateral lower extremities: Knee flexion, knee extension, plantar flexion, dorsiflexion upon discharge. Notes significant improvement in the low back pain with facet loading, improved from before.  Physician-requested Follow-up:  Return in about 2 weeks (around 05/20/2017) for Post Procedure Evaluation.  Future Appointments Date Time Provider [place]  05/07/2017 9:00 AM Mikey College, NP [facility] None  05/21/2017 2:30 PM Gillis Santa, MD [facility] None  07/21/2017 8:00 AM Mikey College, NP [facility] None    Imaging Orders     DG C-Arm 1-60 Min-No Report Procedure Orders    No procedure(s) ordered today    Medications  ordered for procedure: Meds ordered this encounter  Medications  . lactated ringers infusion 1,000 mL  . fentaNYL (SUBLIMAZE) injection 25-50 mcg    Make sure Narcan is available in the pyxis when using this medication. In the event of respiratory depression (RR< 8/min): Titrate NARCAN (naloxone) in increments of 0.1 to 0.2 mg IV at 2-3 minute intervals, until desired degree of reversal.  . ropivacaine (PF) 2 mg/mL (0.2%) (NAROPIN) injection 9 mL  . lidocaine (XYLOCAINE) 1 % (with pres) injection 10 mL   Medications administered: We administered lactated ringers, fentaNYL, ropivacaine (PF) 2 mg/mL (0.2%), and lidocaine.  See the medical record for exact dosing, route, and time of administration.  New Prescriptions   No medications on file   Primary Care Physician: Mikey College, NP Location: [facility] Note by: Gillis Santa, MD Date: 05/06/2017; Time: 4:24 PM  Disclaimer:  Medicine is not an exact science. The only guarantee in medicine is that nothing is guaranteed. It is important to note that the decision to proceed with this intervention was based on the information collected from the patient. The Data and conclusions were drawn from the patient's questionnaire, the interview, and the physical examination. Because the information was provided in large part by the patient, it cannot be guaranteed that it has not been purposely or unconsciously manipulated. Every effort has been made to obtain as much relevant data as possible for this evaluation. It is important to note that the conclusions that lead to this procedure are derived in large part from the available data. Always take into account that the treatment will also be dependent on availability of resources and existing treatment guidelines, considered by other Pain Management Practitioners as being common knowledge and practice, at the time of the intervention. For Medico-Legal purposes, it is also  important to point out that variation in procedural techniques and pharmacological choices are the acceptable norm. The indications, contraindications, technique, and results of the above procedure should only be interpreted and judged by a Board-Certified Interventional Pain Specialist with extensive familiarity and  expertise in the same exact procedure and technique.

## 2017-05-06 NOTE — Progress Notes (Signed)
Safety precautions to be maintained throughout the outpatient stay will include: orient to surroundings, keep bed in low position, maintain call bell within reach at all times, provide assistance with transfer out of bed and ambulation.  

## 2017-05-06 NOTE — Patient Instructions (Signed)
Pain Management Discharge Instructions  General Discharge Instructions :  If you need to reach your doctor call: Monday-Friday 8:00 am - 4:00 pm at [phone] or toll free [phone].  After clinic hours [phone] to have operator reach doctor.  Bring all of your medication bottles to all your appointments in the pain clinic.  To cancel or reschedule your appointment with Pain Management please remember to call 24 hours in advance to avoid a fee.  Refer to the educational materials which you have been given on: General Risks, I had my Procedure. Discharge Instructions, Post Sedation.  Post Procedure Instructions:  The drugs you were given will stay in your system until tomorrow, so for the next 24 hours you should not drive, make any legal decisions or drink any alcoholic beverages.  You may eat anything you prefer, but it is better to start with liquids then soups and crackers, and gradually work up to solid foods.  Please notify your doctor immediately if you have any unusual bleeding, trouble breathing or pain that is not related to your normal pain.  Depending on the type of procedure that was done, some parts of your body may feel week and/or numb.  This usually clears up by tonight or the next day.  Walk with the use of an assistive device or accompanied by an adult for the 24 hours.  You may use ice on the affected area for the first 24 hours.  Put ice in a Ziploc bag and cover with a towel and place against area 15 minutes on 15 minutes off.  You may switch to heat after 24 hours.GENERAL RISKS AND COMPLICATIONS  What are the risk, side effects and possible complications? Generally speaking, most procedures are safe.  However, with any procedure there are risks, side effects, and the possibility of complications.  The risks and complications are dependent upon the sites that are lesioned, or the type of nerve block to be performed.  The closer the procedure is to the spine,  the more serious the risks are.  Great care is taken when placing the radio frequency needles, block needles or lesioning probes, but sometimes complications can occur. 1. Infection: Any time there is an injection through the skin, there is a risk of infection.  This is why sterile conditions are used for these blocks.  There are four possible types of infection. 1. Localized skin infection. 2. Central Nervous System Infection-This can be in the form of Meningitis, which can be deadly. 3. Epidural Infections-This can be in the form of an epidural abscess, which can cause pressure inside of the spine, causing compression of the spinal cord with subsequent paralysis. This would require an emergency surgery to decompress, and there are no guarantees that the patient would recover from the paralysis. 4. Discitis-This is an infection of the intervertebral discs.  It occurs in about 1% of discography procedures.  It is difficult to treat and it may lead to surgery.        2. Pain: the needles have to go through skin and soft tissues, will cause soreness.       3. Damage to internal structures:  The nerves to be lesioned may be near blood vessels or    other nerves which can be potentially damaged.       4. Bleeding: Bleeding is more common if the patient is taking blood thinners such as  aspirin, Coumadin, Ticiid, Plavix, etc., or if he/she have some genetic predisposition  such as   hemophilia. Bleeding into the spinal canal can cause compression of the spinal  cord with subsequent paralysis.  This would require an emergency surgery to  decompress and there are no guarantees that the patient would recover from the  paralysis.       5. Pneumothorax:  Puncturing of a lung is a possibility, every time a needle is introduced in  the area of the chest or upper back.  Pneumothorax refers to free air around the  collapsed lung(s), inside of the thoracic cavity (chest cavity).  Another two possible  complications  related to a similar event would include: Hemothorax and Chylothorax.   These are variations of the Pneumothorax, where instead of air around the collapsed  lung(s), you may have blood or chyle, respectively.       6. Spinal headaches: They may occur with any procedures in the area of the spine.       7. Persistent CSF (Cerebro-Spinal Fluid) leakage: This is a rare problem, but may occur  with prolonged intrathecal or epidural catheters either due to the formation of a fistulous  track or a dural tear.       8. Nerve damage: By working so close to the spinal cord, there is always a possibility of  nerve damage, which could be as serious as a permanent spinal cord injury with  paralysis.       9. Death:  Although rare, severe deadly allergic reactions known as "Anaphylactic  reaction" can occur to any of the medications used.      10. Worsening of the symptoms:  We can always make thing worse.  What are the chances of something like this happening? Chances of any of this occuring are extremely low.  By statistics, you have more of a chance of getting killed in a motor vehicle accident: while driving to the hospital than any of the above occurring .  Nevertheless, you should be aware that they are possibilities.  In general, it is similar to taking a shower.  Everybody knows that you can slip, hit your head and get killed.  Does that mean that you should not shower again?  Nevertheless always keep in mind that statistics do not mean anything if you happen to be on the wrong side of them.  Even if a procedure has a 1 (one) in a 1,000,000 (million) chance of going wrong, it you happen to be that one..Also, keep in mind that by statistics, you have more of a chance of having something go wrong when taking medications.  Who should not have this procedure? If you are on a blood thinning medication (e.g. Coumadin, Plavix, see list of "Blood Thinners"), or if you have an active infection going on, you should not  have the procedure.  If you are taking any blood thinners, please inform your physician.  How should I prepare for this procedure?  Do not eat or drink anything at least six hours prior to the procedure.  Bring a driver with you .  It cannot be a taxi.  Come accompanied by an adult that can drive you back, and that is strong enough to help you if your legs get weak or numb from the local anesthetic.  Take all of your medicines the morning of the procedure with just enough water to swallow them.  If you have diabetes, make sure that you are scheduled to have your procedure done first thing in the morning, whenever possible.  If you have diabetes,   take only half of your insulin dose and notify our nurse that you have done so as soon as you arrive at the clinic.  If you are diabetic, but only take blood sugar pills (oral hypoglycemic), then do not take them on the morning of your procedure.  You may take them after you have had the procedure.  Do not take aspirin or any aspirin-containing medications, at least eleven (11) days prior to the procedure.  They may prolong bleeding.  Wear loose fitting clothing that may be easy to take off and that you would not mind if it got stained with Betadine or blood.  Do not wear any jewelry or perfume  Remove any nail coloring.  It will interfere with some of our monitoring equipment.  NOTE: Remember that this is not meant to be interpreted as a complete list of all possible complications.  Unforeseen problems may occur.  BLOOD THINNERS The following drugs contain aspirin or other products, which can cause increased bleeding during surgery and should not be taken for 2 weeks prior to and 1 week after surgery.  If you should need take something for relief of minor pain, you may take acetaminophen which is found in Tylenol,m Datril, Anacin-3 and Panadol. It is not blood thinner. The products listed below are.  Do not take any of the products listed below  in addition to any listed on your instruction sheet.  A.P.C or A.P.C with Codeine Codeine Phosphate Capsules #3 Ibuprofen Ridaura  ABC compound Congesprin Imuran rimadil  Advil Cope Indocin Robaxisal  Alka-Seltzer Effervescent Pain Reliever and Antacid Coricidin or Coricidin-D  Indomethacin Rufen  Alka-Seltzer plus Cold Medicine Cosprin Ketoprofen S-A-C Tablets  Anacin Analgesic Tablets or Capsules Coumadin Korlgesic Salflex  Anacin Extra Strength Analgesic tablets or capsules CP-2 Tablets Lanoril Salicylate  Anaprox Cuprimine Capsules Levenox Salocol  Anexsia-D Dalteparin Magan Salsalate  Anodynos Darvon compound Magnesium Salicylate Sine-off  Ansaid Dasin Capsules Magsal Sodium Salicylate  Anturane Depen Capsules Marnal Soma  APF Arthritis pain formula Dewitt's Pills Measurin Stanback  Argesic Dia-Gesic Meclofenamic Sulfinpyrazone  Arthritis Bayer Timed Release Aspirin Diclofenac Meclomen Sulindac  Arthritis pain formula Anacin Dicumarol Medipren Supac  Analgesic (Safety coated) Arthralgen Diffunasal Mefanamic Suprofen  Arthritis Strength Bufferin Dihydrocodeine Mepro Compound Suprol  Arthropan liquid Dopirydamole Methcarbomol with Aspirin Synalgos  ASA tablets/Enseals Disalcid Micrainin Tagament  Ascriptin Doan's Midol Talwin  Ascriptin A/D Dolene Mobidin Tanderil  Ascriptin Extra Strength Dolobid Moblgesic Ticlid  Ascriptin with Codeine Doloprin or Doloprin with Codeine Momentum Tolectin  Asperbuf Duoprin Mono-gesic Trendar  Aspergum Duradyne Motrin or Motrin IB Triminicin  Aspirin plain, buffered or enteric coated Durasal Myochrisine Trigesic  Aspirin Suppositories Easprin Nalfon Trillsate  Aspirin with Codeine Ecotrin Regular or Extra Strength Naprosyn Uracel  Atromid-S Efficin Naproxen Ursinus  Auranofin Capsules Elmiron Neocylate Vanquish  Axotal Emagrin Norgesic Verin  Azathioprine Empirin or Empirin with Codeine Normiflo Vitamin E  Azolid Emprazil Nuprin Voltaren  Bayer  Aspirin plain, buffered or children's or timed BC Tablets or powders Encaprin Orgaran Warfarin Sodium  Buff-a-Comp Enoxaparin Orudis Zorpin  Buff-a-Comp with Codeine Equegesic Os-Cal-Gesic   Buffaprin Excedrin plain, buffered or Extra Strength Oxalid   Bufferin Arthritis Strength Feldene Oxphenbutazone   Bufferin plain or Extra Strength Feldene Capsules Oxycodone with Aspirin   Bufferin with Codeine Fenoprofen Fenoprofen Pabalate or Pabalate-SF   Buffets II Flogesic Panagesic   Buffinol plain or Extra Strength Florinal or Florinal with Codeine Panwarfarin   Buf-Tabs Flurbiprofen Penicillamine   Butalbital Compound Four-way cold tablets   Penicillin   Butazolidin Fragmin Pepto-Bismol   Carbenicillin Geminisyn Percodan   Carna Arthritis Reliever Geopen Persantine   Carprofen Gold's salt Persistin   Chloramphenicol Goody's Phenylbutazone   Chloromycetin Haltrain Piroxlcam   Clmetidine heparin Plaquenil   Cllnoril Hyco-pap Ponstel   Clofibrate Hydroxy chloroquine Propoxyphen         Before stopping any of these medications, be sure to consult the physician who ordered them.  Some, such as Coumadin (Warfarin) are ordered to prevent or treat serious conditions such as "deep thrombosis", "pumonary embolisms", and other heart problems.  The amount of time that you may need off of the medication may also vary with the medication and the reason for which you were taking it.  If you are taking any of these medications, please make sure you notify your pain physician before you undergo any procedures.         Facet Blocks Patient Information  Description: The facets are joints in the spine between the vertebrae.  Like any joints in the body, facets can become irritated and painful.  Arthritis can also effect the facets.  By injecting steroids and local anesthetic in and around these joints, we can temporarily block the nerve supply to them.  Steroids act directly on irritated nerves and tissues  to reduce selling and inflammation which often leads to decreased pain.  Facet blocks may be done anywhere along the spine from the neck to the low back depending upon the location of your pain.   After numbing the skin with local anesthetic (like Novocaine), a small needle is passed onto the facet joints under x-ray guidance.  You may experience a sensation of pressure while this is being done.  The entire block usually lasts about 15-25 minutes.   Conditions which may be treated by facet blocks:   Low back/buttock pain  Neck/shoulder pain  Certain types of headaches  Preparation for the injection:  1. Do not eat any solid food or dairy products within 8 hours of your appointment. 2. You may drink clear liquid up to 3 hours before appointment.  Clear liquids include water, black coffee, juice or soda.  No milk or cream please. 3. You may take your regular medication, including pain medications, with a sip of water before your appointment.  Diabetics should hold regular insulin (if taken separately) and take 1/2 normal NPH dose the morning of the procedure.  Carry some sugar containing items with you to your appointment. 4. A driver must accompany you and be prepared to drive you home after your procedure. 5. Bring all your current medications with you. 6. An IV may be inserted and sedation may be given at the discretion of the physician. 7. A blood pressure cuff, EKG and other monitors will often be applied during the procedure.  Some patients may need to have extra oxygen administered for a short period. 8. You will be asked to provide medical information, including your allergies and medications, prior to the procedure.  We must know immediately if you are taking blood thinners (like Coumadin/Warfarin) or if you are allergic to IV iodine contrast (dye).  We must know if you could possible be pregnant.  Possible side-effects:   Bleeding from needle site  Infection (rare, may require  surgery)  Nerve injury (rare)  Numbness & tingling (temporary)  Difficulty urinating (rare, temporary)  Spinal headache (a headache worse with upright posture)  Light-headedness (temporary)  Pain at injection site (serveral days)  Decreased blood pressure (rare,   temporary)  Weakness in arm/leg (temporary)  Pressure sensation in back/neck (temporary)   Call if you experience:   Fever/chills associated with headache or increased back/neck pain  Headache worsened by an upright position  New onset, weakness or numbness of an extremity below the injection site  Hives or difficulty breathing (go to the emergency room)  Inflammation or drainage at the injection site(s)  Severe back/neck pain greater than usual  New symptoms which are concerning to you  Please note:  Although the local anesthetic injected can often make your back or neck feel good for several hours after the injection, the pain will likely return. It takes 3-7 days for steroids to work.  You may not notice any pain relief for at least one week.  If effective, we will often do a series of 2-3 injections spaced 3-6 weeks apart to maximally decrease your pain.  After the initial series, you may be a candidate for a more permanent nerve block of the facets.  If you have any questions, please call [phone] [facility] 

## 2017-05-07 ENCOUNTER — Telehealth: Payer: Self-pay

## 2017-05-07 ENCOUNTER — Encounter: Payer: BLUE CROSS/BLUE SHIELD | Admitting: Nurse Practitioner

## 2017-05-07 NOTE — Telephone Encounter (Signed)
Pt not avail, spoke with son, states taht she is doing fine. Instructed him to tell pt to call if needed

## 2017-05-21 ENCOUNTER — Ambulatory Visit: Payer: BLUE CROSS/BLUE SHIELD | Admitting: Student in an Organized Health Care Education/Training Program

## 2017-05-27 ENCOUNTER — Ambulatory Visit: Payer: BLUE CROSS/BLUE SHIELD | Admitting: Student in an Organized Health Care Education/Training Program

## 2017-06-02 ENCOUNTER — Ambulatory Visit
Payer: BLUE CROSS/BLUE SHIELD | Attending: Student in an Organized Health Care Education/Training Program | Admitting: Student in an Organized Health Care Education/Training Program

## 2017-06-02 ENCOUNTER — Encounter: Payer: Self-pay | Admitting: Student in an Organized Health Care Education/Training Program

## 2017-06-02 VITALS — BP 128/57 | HR 71 | Temp 97.9°F | Resp 16 | Ht 62.0 in | Wt 240.0 lb

## 2017-06-02 DIAGNOSIS — M5136 Other intervertebral disc degeneration, lumbar region: Secondary | ICD-10-CM | POA: Diagnosis not present

## 2017-06-02 DIAGNOSIS — M48062 Spinal stenosis, lumbar region with neurogenic claudication: Secondary | ICD-10-CM

## 2017-06-02 DIAGNOSIS — M1990 Unspecified osteoarthritis, unspecified site: Secondary | ICD-10-CM

## 2017-06-02 DIAGNOSIS — I10 Essential (primary) hypertension: Secondary | ICD-10-CM | POA: Diagnosis not present

## 2017-06-02 DIAGNOSIS — M47816 Spondylosis without myelopathy or radiculopathy, lumbar region: Secondary | ICD-10-CM

## 2017-06-02 DIAGNOSIS — G8929 Other chronic pain: Secondary | ICD-10-CM | POA: Diagnosis present

## 2017-06-02 DIAGNOSIS — I429 Cardiomyopathy, unspecified: Secondary | ICD-10-CM | POA: Diagnosis not present

## 2017-06-02 DIAGNOSIS — E785 Hyperlipidemia, unspecified: Secondary | ICD-10-CM | POA: Insufficient documentation

## 2017-06-02 DIAGNOSIS — M170 Bilateral primary osteoarthritis of knee: Secondary | ICD-10-CM

## 2017-06-02 DIAGNOSIS — M533 Sacrococcygeal disorders, not elsewhere classified: Secondary | ICD-10-CM

## 2017-06-02 DIAGNOSIS — E1142 Type 2 diabetes mellitus with diabetic polyneuropathy: Secondary | ICD-10-CM | POA: Insufficient documentation

## 2017-06-02 DIAGNOSIS — E0841 Diabetes mellitus due to underlying condition with diabetic mononeuropathy: Secondary | ICD-10-CM | POA: Diagnosis not present

## 2017-06-02 DIAGNOSIS — K56609 Unspecified intestinal obstruction, unspecified as to partial versus complete obstruction: Secondary | ICD-10-CM | POA: Insufficient documentation

## 2017-06-02 DIAGNOSIS — M51369 Other intervertebral disc degeneration, lumbar region without mention of lumbar back pain or lower extremity pain: Secondary | ICD-10-CM

## 2017-06-02 DIAGNOSIS — M545 Low back pain: Secondary | ICD-10-CM | POA: Insufficient documentation

## 2017-06-02 DIAGNOSIS — E0942 Drug or chemical induced diabetes mellitus with neurological complications with diabetic polyneuropathy: Secondary | ICD-10-CM | POA: Diagnosis not present

## 2017-06-02 MED ORDER — MELOXICAM 7.5 MG PO TABS: 7.5000 mg | ORAL_TABLET | Freq: Two times a day (BID) | ORAL | 0 refills | Status: AC

## 2017-06-02 MED ORDER — TIZANIDINE HCL 4 MG PO CAPS: 4.0000 mg | ORAL_CAPSULE | Freq: Two times a day (BID) | ORAL | 0 refills | Status: DC | PRN

## 2017-06-02 NOTE — Progress Notes (Signed)
alsoPatient's Name: Stacy Martinez  MRN: [id number]  Referring Provider: Mikey College, *  DOB: [birth year]  PCP: Mikey College, NP  DOS: 06/02/2017  Note by: Gillis Santa, MD  Service setting: Ambulatory outpatient  Specialty: Interventional Pain Management  Location: [facility] (AMB) Pain Management Facility    Patient type: Established   Primary Reason(s) for Visit: Encounter for post-procedure evaluation of chronic illness with mild to moderate exacerbation CC: Back Pain (low)  HPI  [name] is a 61 y.o. year old, female patient, who comes today for a post-procedure evaluation. She has Bowel obstruction ([place]); BP (high blood pressure); DM type 2 with diabetic peripheral neuropathy ([place]); Wound of skin; Hyperlipidemia associated with type 2 diabetes mellitus ([place]); DDD (degenerative disc disease), lumbar; Cardiomyopathy ([place]); Arthritis; Facet syndrome, lumbar; Diabetic neuropathy ([facility]); DJD (degenerative joint disease) of knee; and Sacroiliac joint dysfunction on her problem list. Her primarily concern today is the Back Pain (low)  Pain Assessment: Location: Lower, Left Back Radiating: radiates down back of both legs to knees at times Onset: More than a month ago Duration: Chronic pain Quality: Dull, Throbbing, Heaviness Severity: 5 /10 (self-reported pain score)  Note: Reported level is compatible with observation.                   When using our objective Pain Scale, levels between 6 and 10/10 are said to belong in an emergency room, as it progressively worsens from a 6/10, described as severely limiting, requiring emergency care not usually available at an outpatient pain management facility. At a 6/10 level, communication becomes difficult and requires great effort. Assistance to reach the emergency department may be required. Facial flushing and profuse sweating along with potentially dangerous increases in heart rate and blood pressure will be evident. Effect on ADL:   Timing:  Constant Modifying factors:    [name] comes in today for post-procedure evaluation after the treatment done on 05/06/2017.  Further details on both, my assessment(s), as well as the proposed treatment plan, please see below.  Post-Procedure Assessment  05/06/2017 Procedure: bilateral lumbar facet blocks at L3/L4, L4/L5, L5/S1 Pre-procedure pain score:  4/10 Post-procedure pain score: 0/10 (100% relief) Influential Factors: BMI: 43.90 kg/m Intra-procedural challenges: None observed.         Assessment challenges: None detected.              Reported side-effects: None.        Post-procedural adverse reactions or complications: None reported         Sedation: Please see nurses note. When no sedatives are used, the analgesic levels obtained are directly associated to the effectiveness of the local anesthetics. However, when sedation is provided, the level of analgesia obtained during the initial 1 hour following the intervention, is believed to be the result of a combination of factors. These factors may include, but are not limited to: 1. The effectiveness of the local anesthetics used. 2. The effects of the analgesic(s) and/or anxiolytic(s) used. 3. The degree of discomfort experienced by the patient at the time of the procedure. 4. The patients ability and reliability in recalling and recording the events. 5. The presence and influence of possible secondary gains and/or psychosocial factors. Reported result: Relief experienced during the 1st hour after the procedure: 100 % (Ultra-Short Term Relief)            Interpretative annotation: Clinically appropriate result. Analgesia during this period is likely to be Local Anesthetic and/or IV Sedative (Analgesic/Anxiolytic) related.  Effects of local anesthetic: The analgesic effects attained during this period are directly associated to the localized infiltration of local anesthetics and therefore cary significant diagnostic value as to  the etiological location, or anatomical origin, of the pain. Expected duration of relief is directly dependent on the pharmacodynamics of the local anesthetic used. Long-acting (4-6 hours) anesthetics used.  Reported result: Relief during the next 4 to 6 hour after the procedure: 100 % (Short-Term Relief)            Interpretative annotation: Clinically appropriate result. Analgesia during this period is likely to be Local Anesthetic-related.          Long-term benefit: Defined as the period of time past the expected duration of local anesthetics (1 hour for short-acting and 4-6 hours for long-acting). With the possible exception of prolonged sympathetic blockade from the local anesthetics, benefits during this period are typically attributed to, or associated with, other factors such as analgesic sensory neuropraxia, antiinflammatory effects, or beneficial biochemical changes provided by agents other than the local anesthetics.  Reported result: Extended relief following procedure: 100 % (lasting 2 days) (Long-Term Relief)            Interpretative annotation: Clinically appropriate result. Short-term benefit. No permanent benefit expected. Inflammation plays a part in the etiology to the pain.          Current benefits: Defined as reported results that persistent at this point in time.   Analgesia: 0-25 %            Function: Somewhat improved ROM: Somewhat improved Interpretative annotation: Recurrence of symptoms. No permanent benefit expected. Effective diagnostic intervention.          Interpretation: Results would suggest a successful diagnostic intervention. We'll proceed with the next treatment, as soon as convenient which will include radiofrequency ablation starting with the left side first.          Plan:  Please see "Plan of Care" for details.       Laboratory Chemistry  Inflammation Markers (CRP: Acute Phase) (ESR: Chronic Phase) No results found for: CRP, ESRSEDRATE                Renal Function Markers Lab Results  Component Value Date   BUN 19 04/08/2017   CREATININE 0.94 04/08/2017   GFRAA >89 06/19/2016   GFRNONAA 86 06/19/2016                 Hepatic Function Markers Lab Results  Component Value Date   AST 13 04/08/2017   ALT 13 04/08/2017   ALBUMIN 4.1 04/08/2017   ALKPHOS 119 04/08/2017                 Electrolytes Lab Results  Component Value Date   NA 140 04/08/2017   K 4.2 04/08/2017   CL 103 04/08/2017   CALCIUM 9.3 04/08/2017                 Neuropathy Markers No results found for: [id number]               Bone Pathology Markers Lab Results  Component Value Date   ALKPHOS 119 04/08/2017   CALCIUM 9.3 04/08/2017                 Coagulation Parameters Lab Results  Component Value Date   PLT 533 (H) 06/19/2016                 Cardiovascular Markers Lab Results  Component Value Date   HGB 13.0 06/19/2016   HCT 40.0 06/19/2016                 Note: Lab results reviewed.  Recent Diagnostic Imaging Results  DG C-Arm 1-60 Min-No Report Fluoroscopy was utilized by the requesting physician.  No radiographic  interpretation.   Note: Imaging results reviewed.        Meds   Current Outpatient Prescriptions:  .  amLODipine (NORVASC) 10 MG tablet, Take 1 tablet (10 mg total) by mouth daily., Disp: 30 tablet, Rfl: 0 .  carvedilol (COREG) 25 MG tablet, Take 1 tablet (25 mg total) by mouth 2 (two) times daily with a meal., Disp: 60 tablet, Rfl: 0 .  empagliflozin (JARDIANCE) 25 MG TABS tablet, Take 25 mg by mouth daily., Disp: , Rfl:  .  enalapril (VASOTEC) 20 MG tablet, Take 1 tablet (20 mg total) by mouth 2 (two) times daily., Disp: 60 tablet, Rfl: 11 .  Exenatide ER (BYDUREON BCISE) 2 MG/0.85ML AUIJ, Inject 2 mg into the skin once a week., Disp: 12 pen, Rfl: 1 .  furosemide (LASIX) 40 MG tablet, Take 2 tablets (80 mg total) by mouth daily. (Patient taking differently: Take 40 mg by mouth daily. ), Disp: 60 tablet, Rfl: 0 .   glucose blood (RELION GLUCOSE TEST STRIPS) test strip, Use as instructed, Disp: 100 each, Rfl: 12 .  ReliOn Ultra Thin Lancets MISC, 1 each by Does not apply route daily., Disp: 100 each, Rfl: 11 .  rosuvastatin (CRESTOR) 5 MG tablet, Take 1 tablet (5 mg total) by mouth daily., Disp: 90 tablet, Rfl: 3 .  tiZANidine (ZANAFLEX) 4 MG capsule, Take 1 capsule (4 mg total) by mouth 2 (two) times daily as needed for muscle spasms., Disp: 60 capsule, Rfl: 0 .  diclofenac sodium (VOLTAREN) 1 % GEL, Apply 4 g topically 4 (four) times daily. (Patient not taking: Reported on 06/02/2017), Disp: 100 g, Rfl: 2 .  meloxicam (MOBIC) 7.5 MG tablet, Take 1 tablet (7.5 mg total) by mouth 2 (two) times daily., Disp: 28 tablet, Rfl: 0 .  traMADol (ULTRAM) 50 MG tablet, Limit one half to one tab by mouth per day or every other day if tolerated (Patient not taking: Reported on 06/02/2017), Disp: 30 tablet, Rfl: 0  Current Facility-Administered Medications:  .  bupivacaine (PF) (MARCAINE) 0.25 % injection 30 mL, 30 mL, Other, Once, Mohammed Kindle, MD .  ceFAZolin (ANCEF) IVPB 1 g/50 mL premix, 1 g, Intravenous, Once, Mohammed Kindle, MD .  fentaNYL (SUBLIMAZE) injection 100 mcg, 100 mcg, Intravenous, Once, Mohammed Kindle, MD .  lactated ringers infusion 1,000 mL, 1,000 mL, Intravenous, Continuous, Mohammed Kindle, MD, Last Rate: 125 mL/hr at 01/30/16 0933, 1,000 mL at 01/30/16 0933 .  lactated ringers infusion 1,000 mL, 1,000 mL, Intravenous, Continuous, Mohammed Kindle, MD, Last Rate: 125 mL/hr at 03/10/16 1100, 1,000 mL at 03/10/16 1100 .  lactated ringers infusion 1,000 mL, 1,000 mL, Intravenous, Continuous, Mohammed Kindle, MD .  lidocaine (PF) (XYLOCAINE) 1 % injection 10 mL, 10 mL, Subcutaneous, Once, Mohammed Kindle, MD .  midazolam (VERSED) 5 MG/5ML injection 5 mg, 5 mg, Intravenous, Once, Mohammed Kindle, MD .  orphenadrine (NORFLEX) injection 60 mg, 60 mg, Intramuscular, Once, Mohammed Kindle, MD .  sodium chloride flush  (NS) 0.9 % injection 20 mL, 20 mL, Other, Once, Mohammed Kindle, MD .  triamcinolone acetonide (KENALOG-40) injection 40 mg, 40 mg, Other, Once, Mohammed Kindle, MD  ROS  Constitutional: Denies any fever  or chills Gastrointestinal: No reported hemesis, hematochezia, vomiting, or acute GI distress Musculoskeletal: Denies any acute onset joint swelling, redness, loss of ROM, or weakness Neurological: No reported episodes of acute onset apraxia, aphasia, dysarthria, agnosia, amnesia, paralysis, loss of coordination, or loss of consciousness  Allergies  Ms. Leathers is allergic to prednisone.  PFSH  Drug: Ms. Bostwick  reports that she does not use drugs. Alcohol:  reports that she does not drink alcohol. Tobacco:  reports that she has never smoked. She has never used smokeless tobacco. Medical:  has a past medical history of Allergy; Arthritis; Diabetes mellitus without complication ([place]); Enlarged heart; and Hypertension. Surgical: Ms. Laroque  has a past surgical history that includes Colon surgery; Hernia repair; and Wrist fracture surgery (Right). Family: family history includes Cancer in her father; Diabetes in her mother; Mental illness in her son.  Constitutional Exam  General appearance: Well nourished, well developed, and well hydrated. In no apparent acute distress Vitals:   06/02/17 1100  BP: (!) 128/57  Pulse: 71  Resp: 16  Temp: 97.9 F (36.6 C)  SpO2: 97%  Weight: 240 lb (108.9 kg)  Height: _0  (1.575 m)   BMI Assessment: Estimated body mass index is 43.9 kg/m as calculated from the following:   Height as of this encounter: _1  (1.575 m).   Weight as of this encounter: 240 lb (108.9 kg).  BMI interpretation table: BMI level Category Range association with higher incidence of chronic pain  <18 kg/m2 Underweight   18.5-24.9 kg/m2 Ideal body weight   25-29.9 kg/m2 Overweight Increased incidence by 20%  30-34.9 kg/m2 Obese (Class I) Increased incidence by 68%  35-39.9 kg/m2  Severe obesity (Class II) Increased incidence by 136%  >40 kg/m2 Extreme obesity (Class III) Increased incidence by 254%   BMI Readings from Last 4 Encounters:  06/02/17 43.90 kg/m  05/06/17 43.90 kg/m  04/23/17 43.90 kg/m  04/02/17 44.56 kg/m   Wt Readings from Last 4 Encounters:  06/02/17 240 lb (108.9 kg)  05/06/17 240 lb (108.9 kg)  04/23/17 240 lb (108.9 kg)  04/02/17 243 lb 9.6 oz (110.5 kg)  Psych/Mental status: Alert, oriented x 3 (person, place, & time)       Eyes: PERLA Respiratory: No evidence of acute respiratory distress  Cervical Spine Area Exam  Skin & Axial Inspection: No masses, redness, edema, swelling, or associated skin lesions Alignment: Symmetrical Functional ROM: Unrestricted ROM      Stability: No instability detected Muscle Tone/Strength: Functionally intact. No obvious neuro-muscular anomalies detected. Sensory (Neurological): Unimpaired Palpation: No palpable anomalies              Upper Extremity (UE) Exam    Side: Right upper extremity  Side: Left upper extremity  Skin & Extremity Inspection: Skin color, temperature, and hair growth are WNL. No peripheral edema or cyanosis. No masses, redness, swelling, asymmetry, or associated skin lesions. No contractures.  Skin & Extremity Inspection: Skin color, temperature, and hair growth are WNL. No peripheral edema or cyanosis. No masses, redness, swelling, asymmetry, or associated skin lesions. No contractures.  Functional ROM: Unrestricted ROM          Functional ROM: Unrestricted ROM          Muscle Tone/Strength: Functionally intact. No obvious neuro-muscular anomalies detected.  Muscle Tone/Strength: Functionally intact. No obvious neuro-muscular anomalies detected.  Sensory (Neurological): Unimpaired          Sensory (Neurological): Unimpaired          Palpation:  No palpable anomalies              Palpation: No palpable anomalies              Specialized Test(s): Deferred         Specialized Test(s):  Deferred          Thoracic Spine Area Exam  Skin & Axial Inspection: No masses, redness, or swelling Alignment: Symmetrical Functional ROM: Unrestricted ROM Stability: No instability detected Muscle Tone/Strength: Functionally intact. No obvious neuro-muscular anomalies detected. Sensory (Neurological): Unimpaired Muscle strength & Tone: No palpable anomalies  Lumbar Spine Area Exam  Skin & Axial Inspection: No masses, redness, or swelling Alignment: Symmetrical Functional ROM: Unrestricted ROM      Stability: No instability detected Muscle Tone/Strength: Functionally intact. No obvious neuro-muscular anomalies detected. Sensory (Neurological): Unimpaired Palpation: Complains of area being tender to palpation       Provocative Tests: Lumbar Hyperextension and rotation test: Positive left greater than right Lumbar Lateral bending test: evaluation deferred today       Patrick's Maneuver: evaluation deferred today                   Tender to palpation overlying lumbar facets Gait & Posture Assessment  Ambulation: Unassisted Gait: Relatively normal for age and body habitus Posture: WNL   Lower Extremity Exam    Side: Right lower extremity  Side: Left lower extremity  Skin & Extremity Inspection: Skin color, temperature, and hair growth are WNL. No peripheral edema or cyanosis. No masses, redness, swelling, asymmetry, or associated skin lesions. No contractures.  Skin & Extremity Inspection: Skin color, temperature, and hair growth are WNL. No peripheral edema or cyanosis. No masses, redness, swelling, asymmetry, or associated skin lesions. No contractures.  Functional ROM: Unrestricted ROM          Functional ROM: Unrestricted ROM          Muscle Tone/Strength: Functionally intact. No obvious neuro-muscular anomalies detected.  Muscle Tone/Strength: Functionally intact. No obvious neuro-muscular anomalies detected.  Sensory (Neurological): Unimpaired  Sensory (Neurological): Unimpaired   Palpation: No palpable anomalies  Palpation: No palpable anomalies   Assessment  Primary Diagnosis & Pertinent Problem List: The primary encounter diagnosis was Spondylosis without myelopathy or radiculopathy, lumbar region. 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  Status Diagnosis  Responding Responding Stable 1. Spondylosis without myelopathy or radiculopathy, lumbar region   2. Facet syndrome, lumbar   3. DDD (degenerative disc disease), lumbar   4. Arthritis   5. 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 Sacroiliac joint dysfunction   7. Primary osteoarthritis of both knees   8. Diabetic mononeuropathy associated with diabetes mellitus due to underlying condition ([place])   9. Spinal stenosis, lumbar region, with neurogenic claudication      61 year old female with a past medical history of cardiomyopathy, hypertension, hyperlipidemia with a history of chronic low back pain secondary to lumbar spondylosis without myelopathy, lumbar spinal stenosis, degenerative joint disease of bilateral knees and SI joint arthralgia. Patient is status post bilateral lumbar facet blocks which resulted in 100% improvement in pain for approximately 2-3 days. Patient had increased range of motion, ability to tolerate  ambulating for a longer period of time, inability to perform activities of daily living without as much pain. This signifies a positive diagnostic nerve block and patient would like to proceed with radiofrequency ablation starting with the left side  first.  Plan: -Radiofrequency ablation of left lumbar facets at L3/L4, L4/L5, L5/S1. We will perform right side after that. -Refill of Mobic 7.5 mg twice a day 14 days and tizanidine as prescribed.  Plan of Care  Pharmacotherapy (Medications Ordered): Meds ordered this encounter  Medications  . tiZANidine (ZANAFLEX) 4 MG capsule    Sig: Take 1 capsule (4 mg total) by mouth 2 (two) times daily as needed for muscle spasms.    Dispense:  60 capsule    Refill:  0    Do not place this medication, or any other prescription from our practice, on "Automatic Refill". Patient may have prescription filled one day early if pharmacy is closed on scheduled refill date.  . meloxicam (MOBIC) 7.5 MG tablet    Sig: Take 1 tablet (7.5 mg total) by mouth 2 (two) times daily.    Dispense:  28 tablet    Refill:  0   Lab-work, procedure(s), and/or referral(s): Orders Placed This Encounter  Procedures  . Radiofrequency,Lumbar    Pharmacological management options:  Opioid Analgesics: The patient was informed that there is no guarantee that she would be a candidate for opioid analgesics. The decision will be made following CDC guidelines. This decision will be based on the results of diagnostic studies, as well as Ms. Quiett's risk profile.   Membrane stabilizer: Consider gabapentin, Lyrica, Topamax  Muscle relaxant: Trial of tizanidine which the patient finds effective.. Can consider baclofen, Robaxin. Patient states she became sedated with Flexeril.  NSAID: Trial of meloxicam for 14 days. Can consider diclofenac trial the future. Do not recommend chronic NSAID therapy. Check creatinine before prescription of NSAIDs.  Other analgesic(s): Voltaren gel trial today. Consider TENS, lidocaine ointment, lidocaine patches.   Interventional management options: Ms. Wignall was informed that there is no guarantee that she would be a candidate for interventional therapies. The decision will be based on the results of  diagnostic studies, as well as Ms. Raschke's risk profile.  Procedure(s) under consideration:  -lumbar radiofrequency ablation -Lumbar epidural steroid injection -SI joint injection -piriformis injection -Bilateral genicular nerves block      Provider-requested follow-up: Return for Procedure.  Future Appointments Date Time Provider [place]  07/21/2017 8:00 AM Mikey College, NP [facility] None    Primary Care Physician: Mikey College, NP Location: [facility] Note by: Gillis Santa, M.D Date: 06/02/2017; Time: 11:36 AM  Patient Instructions  I'm glad to hear that our diagnostic lumbar blocks were very helpful for you. This is a good sign that radiofrequency ablation of those nerves will provide long lasting pain relief.  We will schedule you for left-sided radiofrequency ablation first followed by the right side thereafter. This will be done with sedation.Preparing for Procedure with Sedation Instructions: . Oral Intake: Do not eat or drink anything for at least 8 hours prior to your procedure. . Transportation: Public transportation is not allowed. Bring an adult driver. The driver must be physically present in our waiting room before any procedure can be started. Marland Kitchen Physical Assistance: Bring an adult capable of physically assisting you, in the event you need help. . Blood Pressure Medicine: Take your blood pressure medicine with a sip of water the morning of the procedure. . Insulin: Take only  of your normal insulin dose. . Preventing infections: Shower with an antibacterial soap the morning of your procedure. . Build-up your immune system: Take 1000 mg of Vitamin C with every meal (3 times a day) the day prior to your procedure. . Pregnancy: If you are pregnant, call and cancel the procedure. . Sickness: If you have a cold, fever, or any active infections, call and cancel the procedure. . Arrival: You must be in the facility at  least 30 minutes prior to your scheduled procedure. . Children: Do not bring children with you. . Dress appropriately: Bring dark clothing that you would not mind if they get stained. . Valuables: Do not bring any jewelry or valuables. Procedure appointments are reserved for interventional treatments only. Marland Kitchen No Prescription Refills. . No medication changes will be discussed during procedure appointments. No disability issues will be discussed.Radiofrequency Lesioning Radiofrequency lesioning is a procedure that is performed to relieve pain. The procedure is often used for back, neck, or arm pain. Radiofrequency lesioning involves the use of a machine that creates radio waves to make heat. During the procedure, the heat is applied to the nerve that carries the pain signal. The heat damages the nerve and interferes with the pain signal. Pain relief usually starts about 2 weeks after the procedure and lasts for 6 months to 1 year. Tell a health care provider about:  Any allergies you have.  All medicines you are taking, including vitamins, herbs, eye drops, creams, and over-the-counter medicines.  Any problems you or family members have had with anesthetic medicines.  Any blood disorders you have.  Any surgeries you have had.  Any medical conditions you have.  Whether you are pregnant or may be pregnant. What are the risks? Generally, this is a safe procedure. However, problems may occur, including:  Pain or soreness at the injection site.  Infection at the injection site.  Damage to nerves or blood vessels.  What happens before the procedure?  Ask your health care provider about: ? Changing or stopping your regular medicines. This is especially important if you are taking diabetes medicines or blood thinners. ? Taking medicines such as aspirin and ibuprofen. These medicines can thin your blood. Do not take these medicines before your procedure if your health care provider instructs  you not to.  Follow instructions from your health care provider about eating or drinking restrictions.  Plan to have someone take you home after the procedure.  If you go home right after the procedure, plan to have someone with you for 24 hours. What happens during the procedure?  You will be given one or more of the following: ? A medicine to help you relax (sedative). ? A medicine to numb the area (local anesthetic).  You will be awake during the procedure. You will need to be able to talk with the health care provider during the procedure.  With the help of a type of X-ray (fluoroscopy), the health care provider will insert a radiofrequency needle into the area to be treated.  Next, a wire that carries the radio waves (electrode) will be put through the radiofrequency needle. An electrical pulse will be sent through the electrode to verify the correct nerve. You will feel a tingling sensation, and you may have muscle twitching.  Then, the tissue that is around the needle tip will be heated by an electric current that is passed using the radiofrequency machine. This will numb the nerves.  A bandage (dressing) will be put on  the insertion area after the procedure is done. The procedure may vary among health care providers and hospitals. What happens after the procedure?  Your blood pressure, heart rate, breathing rate, and blood oxygen level will be monitored often until the medicines you were given have worn off.  Return to your normal activities as directed by your health care provider. This information is not intended to replace advice given to you by your health care provider. Make sure you discuss any questions you have with your health care provider. Document Released: 04/16/2011 Document Revised: 01/24/2016 Document Reviewed: 09/25/2014 Elsevier Interactive Patient Education  [employer].

## 2017-06-02 NOTE — Patient Instructions (Addendum)
I'm glad to hear that our diagnostic lumbar blocks were very helpful for you. This is a good sign that radiofrequency ablation of those nerves will provide long lasting pain relief.  We will schedule you for left-sided radiofrequency ablation first followed by the right side thereafter. This will be done with sedation.Preparing for Procedure with Sedation Instructions: . Oral Intake: Do not eat or drink anything for at least 8 hours prior to your procedure. . Transportation: Public transportation is not allowed. Bring an adult driver. The driver must be physically present in our waiting room before any procedure can be started. Marland Kitchen Physical Assistance: Bring an adult capable of physically assisting you, in the event you need help. . Blood Pressure Medicine: Take your blood pressure medicine with a sip of water the morning of the procedure. . Insulin: Take only  of your normal insulin dose. . Preventing infections: Shower with an antibacterial soap the morning of your procedure. . Build-up your immune system: Take 1000 mg of Vitamin C with every meal (3 times a day) the day prior to your procedure. . Pregnancy: If you are pregnant, call and cancel the procedure. . Sickness: If you have a cold, fever, or any active infections, call and cancel the procedure. . Arrival: You must be in the facility at least 30 minutes prior to your scheduled procedure. . Children: Do not bring children with you. . Dress appropriately: Bring dark clothing that you would not mind if they get stained. . Valuables: Do not bring any jewelry or valuables. Procedure appointments are reserved for interventional treatments only. Marland Kitchen No Prescription Refills. . No medication changes will be discussed during procedure appointments. No disability issues will be discussed.Radiofrequency Lesioning Radiofrequency lesioning is a procedure that is performed to relieve pain. The procedure is often used for back, neck, or arm pain.  Radiofrequency lesioning involves the use of a machine that creates radio waves to make heat. During the procedure, the heat is applied to the nerve that carries the pain signal. The heat damages the nerve and interferes with the pain signal. Pain relief usually starts about 2 weeks after the procedure and lasts for 6 months to 1 year. Tell a health care provider about:  Any allergies you have.  All medicines you are taking, including vitamins, herbs, eye drops, creams, and over-the-counter medicines.  Any problems you or family members have had with anesthetic medicines.  Any blood disorders you have.  Any surgeries you have had.  Any medical conditions you have.  Whether you are pregnant or may be pregnant. What are the risks? Generally, this is a safe procedure. However, problems may occur, including:  Pain or soreness at the injection site.  Infection at the injection site.  Damage to nerves or blood vessels.  What happens before the procedure?  Ask your health care provider about: ? Changing or stopping your regular medicines. This is especially important if you are taking diabetes medicines or blood thinners. ? Taking medicines such as aspirin and ibuprofen. These medicines can thin your blood. Do not take these medicines before your procedure if your health care provider instructs you not to.  Follow instructions from your health care provider about eating or drinking restrictions.  Plan to have someone take you home after the procedure.  If you go home right after the procedure, plan to have someone with you for 24 hours. What happens during the procedure?  You will be given one or more of the following: ? A medicine  to help you relax (sedative). ? A medicine to numb the area (local anesthetic).  You will be awake during the procedure. You will need to be able to talk with the health care provider during the procedure.  With the help of a type of X-ray  (fluoroscopy), the health care provider will insert a radiofrequency needle into the area to be treated.  Next, a wire that carries the radio waves (electrode) will be put through the radiofrequency needle. An electrical pulse will be sent through the electrode to verify the correct nerve. You will feel a tingling sensation, and you may have muscle twitching.  Then, the tissue that is around the needle tip will be heated by an electric current that is passed using the radiofrequency machine. This will numb the nerves.  A bandage (dressing) will be put on the insertion area after the procedure is done. The procedure may vary among health care providers and hospitals. What happens after the procedure?  Your blood pressure, heart rate, breathing rate, and blood oxygen level will be monitored often until the medicines you were given have worn off.  Return to your normal activities as directed by your health care provider. This information is not intended to replace advice given to you by your health care provider. Make sure you discuss any questions you have with your health care provider. Document Released: 04/16/2011 Document Revised: 01/24/2016 Document Reviewed: 09/25/2014 Elsevier Interactive Patient Education  [employer].

## 2017-06-02 NOTE — Progress Notes (Signed)
Safety precautions to be maintained throughout the outpatient stay will include: orient to surroundings, keep bed in low position, maintain call bell within reach at all times, provide assistance with transfer out of bed and ambulation.  

## 2017-06-08 ENCOUNTER — Other Ambulatory Visit: Payer: Self-pay | Admitting: Nurse Practitioner

## 2017-06-08 MED ORDER — SEMAGLUTIDE(0.25 OR 0.5MG/DOS) 2 MG/1.5ML ~~LOC~~ SOPN: 0.2500 mg | PEN_INJECTOR | SUBCUTANEOUS | 2 refills | Status: DC

## 2017-06-08 NOTE — Progress Notes (Unsigned)
Please notify patient of change from Bydureon Bcise to Ozempic once weekly because of insurance preference.  Copay card available online for patient to register.

## 2017-06-18 ENCOUNTER — Other Ambulatory Visit: Payer: Self-pay | Admitting: Student in an Organized Health Care Education/Training Program

## 2017-06-18 DIAGNOSIS — M47816 Spondylosis without myelopathy or radiculopathy, lumbar region: Secondary | ICD-10-CM

## 2017-06-18 NOTE — Progress Notes (Signed)
Plan for bilateral diagnostic facet blocks #2 before RFA.

## 2017-06-24 ENCOUNTER — Encounter: Payer: Self-pay | Admitting: Student in an Organized Health Care Education/Training Program

## 2017-06-24 ENCOUNTER — Ambulatory Visit (HOSPITAL_BASED_OUTPATIENT_CLINIC_OR_DEPARTMENT_OTHER): Payer: BLUE CROSS/BLUE SHIELD | Admitting: Student in an Organized Health Care Education/Training Program

## 2017-06-24 ENCOUNTER — Ambulatory Visit
Admission: RE | Admit: 2017-06-24 | Discharge: 2017-06-24 | Disposition: A | Discharge status: Home / Self Care | Payer: BLUE CROSS/BLUE SHIELD | Source: Ambulatory Visit | Attending: Student in an Organized Health Care Education/Training Program | Admitting: Student in an Organized Health Care Education/Training Program

## 2017-06-24 VITALS — BP 117/55 | HR 77 | Temp 96.8°F | Resp 14 | Ht 62.0 in | Wt 240.0 lb

## 2017-06-24 DIAGNOSIS — M47816 Spondylosis without myelopathy or radiculopathy, lumbar region: Secondary | ICD-10-CM | POA: Insufficient documentation

## 2017-06-24 DIAGNOSIS — M51369 Other intervertebral disc degeneration, lumbar region without mention of lumbar back pain or lower extremity pain: Secondary | ICD-10-CM

## 2017-06-24 DIAGNOSIS — M48062 Spinal stenosis, lumbar region with neurogenic claudication: Secondary | ICD-10-CM | POA: Diagnosis not present

## 2017-06-24 DIAGNOSIS — M5136 Other intervertebral disc degeneration, lumbar region: Secondary | ICD-10-CM | POA: Diagnosis not present

## 2017-06-24 MED ORDER — FENTANYL CITRATE (PF) 100 MCG/2ML IJ SOLN
INTRAMUSCULAR | Status: AC
Start: 2017-06-24 — End: ?
  Filled 2017-06-24: qty 2

## 2017-06-24 MED ORDER — ROPIVACAINE HCL 2 MG/ML IJ SOLN
INTRAMUSCULAR | Status: AC
Start: 2017-06-24 — End: ?
  Filled 2017-06-24: qty 10

## 2017-06-24 MED ORDER — FENTANYL CITRATE (PF) 100 MCG/2ML IJ SOLN
25.0000 ug | INTRAMUSCULAR | Status: DC | PRN
  Administered 2017-06-24: 50 ug via INTRAVENOUS

## 2017-06-24 MED ORDER — LIDOCAINE HCL (PF) 1 % IJ SOLN
INTRAMUSCULAR | Status: AC
  Filled 2017-06-24: qty 5

## 2017-06-24 MED ORDER — ROPIVACAINE HCL 2 MG/ML IJ SOLN
10.0000 mL | Freq: Once | INTRAMUSCULAR | Status: AC
  Administered 2017-06-24: 10 mL

## 2017-06-24 MED ORDER — LIDOCAINE HCL (PF) 1 % IJ SOLN
10.0000 mL | Freq: Once | INTRAMUSCULAR | Status: AC
  Administered 2017-06-24: 5 mL

## 2017-06-24 MED ORDER — LACTATED RINGERS IV SOLN
1000.0000 mL | Freq: Once | INTRAVENOUS | Status: AC
  Administered 2017-06-24: 1000 mL via INTRAVENOUS

## 2017-06-24 NOTE — Progress Notes (Signed)
Safety precautions to be maintained throughout the outpatient stay will include: orient to surroundings, keep bed in low position, maintain call bell within reach at all times, provide assistance with transfer out of bed and ambulation.  

## 2017-06-24 NOTE — Patient Instructions (Signed)

## 2017-06-24 NOTE — Progress Notes (Signed)
Patient's Name: Stacy Martinez  MRN: [id number]  Referring Provider: Gillis Santa, MD  DOB: Oct 13, 1955  PCP: Mikey College, NP  DOS: 06/24/2017  Note by: Gillis Santa, MD  Service setting: Ambulatory outpatient  Specialty: Interventional Pain Management  Patient type: Established  Location: [facility] (AMB) Pain Management Facility  Visit type: Interventional Procedure   Primary Reason for Visit: Interventional Pain Management Treatment. CC: Back Pain (low left is worse)  Procedure:  Anesthesia, Analgesia, Anxiolysis:  Type: Diagnostic Medial Branch Facet Block #2 Region: Lumbar Level:  L3, L4, L5,  Medial Branch Level(s) Laterality: Bilateral  Type: Local Anesthesia with Moderate (Conscious) Sedation Local Anesthetic: Lidocaine 1% Route: Intravenous (IV) IV Access: Secured Sedation: Meaningful verbal contact was maintained at all times during the procedure  Indication(s): Analgesia and Anxiety   Indications: 1. Spondylosis without myelopathy or radiculopathy, lumbar region   2. Facet syndrome, lumbar   3. DDD (degenerative disc disease), lumbar   4. Spinal stenosis, lumbar region, with neurogenic claudication   5. Lumbar spondylosis    Pain Score: Pre-procedure: 7 /10 Post-procedure: 0-No pain/10  Pre-op Assessment:  Stacy Martinez is a 61 y.o. (year old), female patient, seen today for interventional treatment. She  has a past surgical history that includes Colon surgery; Hernia repair; and Wrist fracture surgery (Right). Stacy Martinez has a current medication list which includes the following prescription(s): amlodipine, carvedilol, diclofenac sodium, empagliflozin, enalapril, furosemide, glucose blood, relion ultra thin lancets, rosuvastatin, semaglutide, tizanidine, and tramadol, and the following Facility-Administered Medications: bupivacaine (pf), cefazolin, fentanyl, fentanyl, lactated ringers, lactated ringers, lactated ringers, lidocaine (pf), midazolam, orphenadrine, sodium chloride  flush, and triamcinolone acetonide. Her primarily concern today is the Back Pain (low left is worse)  Initial Vital Signs: There were no vitals taken for this visit. BMI: Estimated body mass index is 43.9 kg/m as calculated from the following:   Height as of this encounter: 5\' 2"  (1.575 m).   Weight as of this encounter: 240 lb (108.9 kg).  Risk Assessment: Allergies: Reviewed. She is allergic to prednisone.  Allergy Precautions: None required Coagulopathies: Reviewed. None identified.  Blood-thinner therapy: None at this time Active Infection(s): Reviewed. None identified. Stacy Martinez is afebrile  Site Confirmation: Stacy Martinez was asked to confirm the procedure and laterality before marking the site Procedure checklist: Completed Consent: Before the procedure and under the influence of no sedative(s), amnesic(s), or anxiolytics, the patient was informed of the treatment options, risks and possible complications. To fulfill our ethical and legal obligations, as recommended by the American Medical Association's Code of Ethics, I have informed the patient of my clinical impression; the nature and purpose of the treatment or procedure; the risks, benefits, and possible complications of the intervention; the alternatives, including doing nothing; the risk(s) and benefit(s) of the alternative treatment(s) or procedure(s); and the risk(s) and benefit(s) of doing nothing. The patient was provided information about the general risks and possible complications associated with the procedure. These may include, but are not limited to: failure to achieve desired goals, infection, bleeding, organ or nerve damage, allergic reactions, paralysis, and death. In addition, the patient was informed of those risks and complications associated to Spine-related procedures, such as failure to decrease pain; infection (i.e.: Meningitis, epidural or intraspinal abscess); bleeding (i.e.: epidural hematoma, subarachnoid  hemorrhage, or any other type of intraspinal or peri-dural bleeding); organ or nerve damage (i.e.: Any type of peripheral nerve, nerve root, or spinal cord injury) with subsequent damage to sensory, motor, and/or autonomic systems, resulting in  permanent pain, numbness, and/or weakness of one or several areas of the body; allergic reactions; (i.e.: anaphylactic reaction); and/or death. Furthermore, the patient was informed of those risks and complications associated with the medications. These include, but are not limited to: allergic reactions (i.e.: anaphylactic or anaphylactoid reaction(s)); adrenal axis suppression; blood sugar elevation that in diabetics may result in ketoacidosis or comma; water retention that in patients with history of congestive heart failure may result in shortness of breath, pulmonary edema, and decompensation with resultant heart failure; weight gain; swelling or edema; medication-induced neural toxicity; particulate matter embolism and blood vessel occlusion with resultant organ, and/or nervous system infarction; and/or aseptic necrosis of one or more joints. Finally, the patient was informed that Medicine is not an exact science; therefore, there is also the possibility of unforeseen or unpredictable risks and/or possible complications that may result in a catastrophic outcome. The patient indicated having understood very clearly. We have given the patient no guarantees and we have made no promises. Enough time was given to the patient to ask questions, all of which were answered to the patient's satisfaction. Stacy Martinez has indicated that she wanted to continue with the procedure. Attestation: I, the ordering provider, attest that I have discussed with the patient the benefits, risks, side-effects, alternatives, likelihood of achieving goals, and potential problems during recovery for the procedure that I have provided informed consent. Date: 06/24/2017; Time: 10:30  AM  Pre-Procedure Preparation:  Monitoring: As per clinic protocol. Respiration, ETCO2, SpO2, BP, heart rate and rhythm monitor placed and checked for adequate function Safety Precautions: Patient was assessed for positional comfort and pressure points before starting the procedure. Time-out: I initiated and conducted the "Time-out" before starting the procedure, as per protocol. The patient was asked to participate by confirming the accuracy of the "Time Out" information. Verification of the correct person, site, and procedure were performed and confirmed by me, the nursing staff, and the patient. "Time-out" conducted as per Joint Commission's Universal Protocol (UP.01.01.01). "Time-out" Date & Time: 06/24/2017; 1133 hrs.  Description of Procedure Process:   Position: Prone Target Area: For Lumbar Facet blocks, the target is the groove formed by the junction of the transverse process and superior articular process. For the L5 dorsal ramus, the target is the notch between superior articular process and sacral ala.  Approach: Paramedial approach. Area Prepped: Entire Posterior Lumbosacral Region Prepping solution: ChloraPrep (2% chlorhexidine gluconate and 70% isopropyl alcohol) Safety Precautions: Aspiration looking for blood return was conducted prior to all injections. At no point did we inject any substances, as a needle was being advanced. No attempts were made at seeking any paresthesias. Safe injection practices and needle disposal techniques used. Medications properly checked for expiration dates. SDV (single dose vial) medications used. Description of the Procedure: Protocol guidelines were followed. The patient was placed in position over the fluoroscopy table. The target area was identified and the area prepped in the usual manner. Skin desensitized using vapocoolant spray. Skin & deeper tissues infiltrated with local anesthetic. Appropriate amount of time allowed to pass for local  anesthetics to take effect. The procedure needle was introduced through the skin, ipsilateral to the reported pain, and advanced to the target area. Employing the "Medial Branch Technique", the needles were advanced to the angle made by the superior and medial portion of the transverse process, and the lateral and inferior portion of the superior articulating process of the targeted vertebral bodies. This area is known as "Burton's Eye" or the "Eye of the  Scottish Dog". A procedure needle was introduced through the skin, and this time advanced to the angle made by the superior and medial border of the sacral ala, and the lateral border of the S1 vertebral body. Negative aspiration confirmed. Solution injected in intermittent fashion, asking for systemic symptoms every 0.5cc of injectate. The needles were then removed and the area cleansed, making sure to leave some of the prepping solution back to take advantage of its long term bactericidal properties.   Illustration of the posterior view of the lumbar spine and the posterior neural structures. Laminae of L2 through S1 are labeled. DPRL5, dorsal primary ramus of L5; DPRS1, dorsal primary ramus of S1; DPR3, dorsal primary ramus of L3; FJ, facet (zygapophyseal) joint L3-L4; I, inferior articular process of L4; LB1, lateral branch of dorsal primary ramus of L1; IAB, inferior articular branches from L3 medial branch (supplies L4-L5 facet joint); IBP, intermediate branch plexus; MB3, medial branch of dorsal primary ramus of L3; NR3, third lumbar nerve root; S, superior articular process of L5; SAB, superior articular branches from L4 (supplies L4-5 facet joint also); TP3, transverse process of L3.  Vitals:   06/24/17 1153 06/24/17 1201 06/24/17 1211 06/24/17 1221  BP: 132/71 115/60 (!) 124/52 (!) 117/55  Pulse: 65 66 67 77  Resp: 19 11 13 14   Temp:    (!) 96.8 F (36 C)  TempSrc:      SpO2: 100% 98% 100% 100%  Weight:      Height:        Start Time:  1135 hrs. End Time: 1152 hrs. Materials:  Needle(s) Type: Regular needle Gauge: 22G Length: 3.5-in Medication(s): We administered lactated ringers, fentaNYL, lidocaine (PF), and ropivacaine (PF) 2 mg/mL (0.2%). Please see chart orders for dosing details. 1.5 cc of 0.2% ropivacaine at each level. Imaging Guidance (Spinal):  Type of Imaging Technique: Fluoroscopy Guidance (Spinal) Indication(s): Assistance in needle guidance and placement for procedures requiring needle placement in or near specific anatomical locations not easily accessible without such assistance. Exposure Time: Please see nurses notes. Contrast: None used. Fluoroscopic Guidance: I was personally present during the use of fluoroscopy. "Tunnel Vision Technique" used to obtain the best possible view of the target area. Parallax error corrected before commencing the procedure. "Direction-depth-direction" technique used to introduce the needle under continuous pulsed fluoroscopy. Once target was reached, antero-posterior, oblique, and lateral fluoroscopic projection used confirm needle placement in all planes. Images permanently stored in EMR. Interpretation: No contrast injected. I personally interpreted the imaging intraoperatively. Adequate needle placement confirmed in multiple planes. Permanent images saved into the patient's record.  Antibiotic Prophylaxis:  Indication(s): None identified Antibiotic given: None  Post-operative Assessment:  EBL: None Complications: No immediate post-treatment complications observed by team, or reported by patient. Note: The patient tolerated the entire procedure well. A repeat set of vitals were taken after the procedure and the patient was kept under observation following institutional policy, for this type of procedure. Post-procedural neurological assessment was performed, showing return to baseline, prior to discharge. The patient was provided with post-procedure discharge instructions,  including a section on how to identify potential problems. Should any problems arise concerning this procedure, the patient was given instructions to immediately contact us, at any time, without hesitation. In any case, we plan to contact the patient by telephone for a follow-up status report regarding this interventional procedure. Comments:  No additional relevant information. 5 out of 5 strength bilateral lower extremity: Plantar flexion, dorsiflexion, knee flexion, knee extension.  Plan of  Care  Follow-up in 2 weeks for postprocedure evaluation.   Imaging Orders     DG C-Arm 1-60 Min-No Report Procedure Orders    No procedure(s) ordered today    Medications ordered for procedure: Meds ordered this encounter  Medications  . lactated ringers infusion 1,000 mL  . fentaNYL (SUBLIMAZE) injection 25-50 mcg    Make sure Narcan is available in the pyxis when using this medication. In the event of respiratory depression (RR< 8/min): Titrate NARCAN (naloxone) in increments of 0.1 to 0.2 mg IV at 2-3 minute intervals, until desired degree of reversal.  . lidocaine (PF) (XYLOCAINE) 1 % injection 10 mL  . ropivacaine (PF) 2 mg/mL (0.2%) (NAROPIN) injection 10 mL   Medications administered: We administered lactated ringers, fentaNYL, lidocaine (PF), and ropivacaine (PF) 2 mg/mL (0.2%).  See the medical record for exact dosing, route, and time of administration.  New Prescriptions   No medications on file   Disposition: Discharge home  Discharge Date & Time: 06/24/2017; 1225 hrs.   Physician-requested Follow-up: Return in about 2 weeks (around 07/08/2017) for Post Procedure Evaluation. Future Appointments Date Time Provider [place]  07/09/2017 10:15 AM Gillis Santa, MD [facility] None  07/21/2017 8:00 AM Mikey College, NP [facility] None   Primary Care Physician: Mikey College, NP Location: [facility] Note by: Gillis Santa, MD Date:  06/24/2017; Time: 1:04 PM  Disclaimer:  Medicine is not an exact science. The only guarantee in medicine is that nothing is guaranteed. It is important to note that the decision to proceed with this intervention was based on the information collected from the patient. The Data and conclusions were drawn from the patient's questionnaire, the interview, and the physical examination. Because the information was provided in large part by the patient, it cannot be guaranteed that it has not been purposely or unconsciously manipulated. Every effort has been made to obtain as much relevant data as possible for this evaluation. It is important to note that the conclusions that lead to this procedure are derived in large part from the available data. Always take into account that the treatment will also be dependent on availability of resources and existing treatment guidelines, considered by other Pain Management Practitioners as being common knowledge and practice, at the time of the intervention. For Medico-Legal purposes, it is also important to point out that variation in procedural techniques and pharmacological choices are the acceptable norm. The indications, contraindications, technique, and results of the above procedure should only be interpreted and judged by a Board-Certified Interventional Pain Specialist with extensive familiarity and expertise in the same exact procedure and technique.

## 2017-07-09 ENCOUNTER — Ambulatory Visit
Payer: BLUE CROSS/BLUE SHIELD | Attending: Student in an Organized Health Care Education/Training Program | Admitting: Student in an Organized Health Care Education/Training Program

## 2017-07-09 ENCOUNTER — Encounter: Payer: Self-pay | Admitting: Student in an Organized Health Care Education/Training Program

## 2017-07-09 VITALS — BP 157/69 | HR 63 | Temp 97.9°F | Resp 16 | Ht 62.0 in | Wt 240.0 lb

## 2017-07-09 DIAGNOSIS — M545 Low back pain: Secondary | ICD-10-CM | POA: Diagnosis present

## 2017-07-09 DIAGNOSIS — Z833 Family history of diabetes mellitus: Secondary | ICD-10-CM | POA: Insufficient documentation

## 2017-07-09 DIAGNOSIS — E119 Type 2 diabetes mellitus without complications: Secondary | ICD-10-CM | POA: Diagnosis not present

## 2017-07-09 DIAGNOSIS — M5136 Other intervertebral disc degeneration, lumbar region: Secondary | ICD-10-CM | POA: Diagnosis not present

## 2017-07-09 DIAGNOSIS — I10 Essential (primary) hypertension: Secondary | ICD-10-CM | POA: Insufficient documentation

## 2017-07-09 DIAGNOSIS — Z79891 Long term (current) use of opiate analgesic: Secondary | ICD-10-CM | POA: Diagnosis not present

## 2017-07-09 DIAGNOSIS — Z79899 Other long term (current) drug therapy: Secondary | ICD-10-CM | POA: Diagnosis not present

## 2017-07-09 DIAGNOSIS — M47816 Spondylosis without myelopathy or radiculopathy, lumbar region: Secondary | ICD-10-CM | POA: Diagnosis not present

## 2017-07-09 DIAGNOSIS — Z809 Family history of malignant neoplasm, unspecified: Secondary | ICD-10-CM | POA: Diagnosis not present

## 2017-07-09 NOTE — Progress Notes (Signed)
Safety precautions to be maintained throughout the outpatient stay will include: orient to surroundings, keep bed in low position, maintain call bell within reach at all times, provide assistance with transfer out of bed and ambulation.  

## 2017-07-09 NOTE — Progress Notes (Signed)
Patient's Name: Stacy Martinez  MRN: [id number]  Referring Provider: Mikey College, *  DOB: 02/05/1956  PCP: Mikey College, NP  DOS: 07/09/2017  Note by: Gillis Santa, MD  Service setting: Ambulatory outpatient  Specialty: Interventional Pain Management  Location: [facility] (AMB) Pain Management Facility    Patient type: Established   Primary Reason(s) for Visit: Encounter for post-procedure evaluation of chronic illness with mild to moderate exacerbation CC: Back Pain (lower)  HPI  Stacy Martinez is a 61 y.o. year old, female patient, who comes today for a post-procedure evaluation. She has Bowel obstruction ([place]); BP (high blood pressure); DM type 2 with diabetic peripheral neuropathy ([place]); Wound of skin; Hyperlipidemia associated with type 2 diabetes mellitus ([place]); DDD (degenerative disc disease), lumbar; Cardiomyopathy ([place]); Arthritis; Facet syndrome, lumbar; Diabetic neuropathy ([facility]); DJD (degenerative joint disease) of knee; and Sacroiliac joint dysfunction on their problem list. Her primarily concern today is the Back Pain (lower)  Pain Assessment: Location: Lower Back Radiating: both upper legs Onset: More than a month ago Duration: Chronic pain Quality: Dull, Aching Severity: 5 /10 (self-reported pain score)  Note: Reported level is inconsistent with clinical observations. Clinically the patient looks like a 3/10 Information on the proper use of the pain scale provided to the patient today. When using our objective Pain Scale, levels between 6 and 10/10 are said to belong in an emergency room, as it progressively worsens from a 6/10, described as severely limiting, requiring emergency care not usually available at an outpatient pain management facility. At a 6/10 level, communication becomes difficult and requires great effort. Assistance to reach the emergency department may be required. Facial flushing and profuse sweating along with potentially dangerous increases in heart rate and  blood pressure will be evident. Effect on ADL:   Timing: Constant Modifying factors: rest  Stacy Martinez comes in today for post-procedure evaluation after the treatment done on 06/24/17  Further details on both, my assessment(s), as well as the proposed treatment plan, please see below.  Post-Procedure Assessment  06/24/17 Procedure: bilateral lumbar facet blocks at L3/L4, L4/L5, L5/S1 #2 Pre-procedure pain score:  4/10 Post-procedure pain score: 0/10 (100% relief) Influential Factors: BMI: 43.90 kg/m Intra-procedural challenges: None observed.         Assessment challenges: None detected.              Reported side-effects: None.        Post-procedural adverse reactions or complications: None reported         Sedation: Please see nurses note. When no sedatives are used, the analgesic levels obtained are directly associated to the effectiveness of the local anesthetics. However, when sedation is provided, the level of analgesia obtained during the initial 1 hour following the intervention, is believed to be the result of a combination of factors. These factors may include, but are not limited to: 1. The effectiveness of the local anesthetics used. 2. The effects of the analgesic(s) and/or anxiolytic(s) used. 3. The degree of discomfort experienced by the patient at the time of the procedure. 4. The patients ability and reliability in recalling and recording the events. 5. The presence and influence of possible secondary gains and/or psychosocial factors. Reported result: Relief experienced during the 1st hour after the procedure: 100 % (Ultra-Short Term Relief)            Interpretative annotation: Clinically appropriate result. Analgesia during this period is likely to be Local Anesthetic and/or IV Sedative (Analgesic/Anxiolytic) related.  Effects of local anesthetic: The analgesic effects attained during this period are directly associated to the localized infiltration of local  anesthetics and therefore cary significant diagnostic value as to the etiological location, or anatomical origin, of the pain. Expected duration of relief is directly dependent on the pharmacodynamics of the local anesthetic used. Long-acting (4-6 hours) anesthetics used.  Reported result: Relief during the next 4 to 6 hour after the procedure: 100 % (Short-Term Relief)            Interpretative annotation: Clinically appropriate result. Analgesia during this period is likely to be Local Anesthetic-related.          Long-term benefit: Defined as the period of time past the expected duration of local anesthetics (1 hour for short-acting and 4-6 hours for long-acting). With the possible exception of prolonged sympathetic blockade from the local anesthetics, benefits during this period are typically attributed to, or associated with, other factors such as analgesic sensory neuropraxia, antiinflammatory effects, or beneficial biochemical changes provided by agents other than the local anesthetics.  Reported result: Extended relief following procedure: 0 % (Long-Term Relief)            Interpretative annotation: Clinically appropriate result. Short-term benefit. No permanent benefit expected. Inflammation plays a part in the etiology to the pain.          Current benefits: Defined as reported results that persistent at this point in time.   Analgesia: 0-25 %            Function: Somewhat improved ROM: Somewhat improved Interpretative annotation: Recurrence of symptoms. No permanent benefit expected. Effective diagnostic intervention.          Interpretation: Results would suggest a successful diagnostic intervention. We'll proceed with the next treatment, as soon as convenient which will include radiofrequency ablation starting with the left side first.          Plan:  Please see "Plan of Care" for details.       Laboratory Chemistry  Inflammation Markers (CRP: Acute Phase) (ESR: Chronic Phase) No  results found for: CRP, ESRSEDRATE               Renal Function Markers Lab Results  Component Value Date   BUN 19 04/08/2017   CREATININE 0.94 04/08/2017   GFRAA >89 06/19/2016   GFRNONAA 86 06/19/2016                 Hepatic Function Markers Lab Results  Component Value Date   AST 13 04/08/2017   ALT 13 04/08/2017   ALBUMIN 4.1 04/08/2017   ALKPHOS 119 04/08/2017                 Electrolytes Lab Results  Component Value Date   NA 140 04/08/2017   K 4.2 04/08/2017   CL 103 04/08/2017   CALCIUM 9.3 04/08/2017                 Neuropathy Markers No results found for: [id number]               Bone Pathology Markers Lab Results  Component Value Date   ALKPHOS 119 04/08/2017   CALCIUM 9.3 04/08/2017                 Coagulation Parameters Lab Results  Component Value Date   PLT 533 (H) 06/19/2016                 Cardiovascular Markers Lab Results  Component Value Date  HGB 13.0 06/19/2016   HCT 40.0 06/19/2016                 Note: Lab results reviewed.  Recent Diagnostic Imaging Results  DG C-Arm 1-60 Min-No Report Fluoroscopy was utilized by the requesting physician.  No radiographic  interpretation.   Note: Imaging results reviewed.        Meds   Current Outpatient Medications:  .  amLODipine (NORVASC) 10 MG tablet, Take 1 tablet (10 mg total) by mouth daily., Disp: 30 tablet, Rfl: 0 .  carvedilol (COREG) 25 MG tablet, Take 1 tablet (25 mg total) by mouth 2 (two) times daily with a meal., Disp: 60 tablet, Rfl: 0 .  diclofenac sodium (VOLTAREN) 1 % GEL, Apply 4 g topically 4 (four) times daily., Disp: 100 g, Rfl: 2 .  empagliflozin (JARDIANCE) 25 MG TABS tablet, Take 25 mg by mouth daily., Disp: , Rfl:  .  enalapril (VASOTEC) 20 MG tablet, Take 1 tablet (20 mg total) by mouth 2 (two) times daily., Disp: 60 tablet, Rfl: 11 .  furosemide (LASIX) 40 MG tablet, Take 2 tablets (80 mg total) by mouth daily. (Patient taking differently: Take 40 mg by mouth  daily. ), Disp: 60 tablet, Rfl: 0 .  glucose blood (RELION GLUCOSE TEST STRIPS) test strip, Use as instructed, Disp: 100 each, Rfl: 12 .  ReliOn Ultra Thin Lancets MISC, 1 each by Does not apply route daily., Disp: 100 each, Rfl: 11 .  rosuvastatin (CRESTOR) 5 MG tablet, Take 1 tablet (5 mg total) by mouth daily., Disp: 90 tablet, Rfl: 3 .  Semaglutide (OZEMPIC) 0.25 or 0.5 MG/DOSE SOPN, Inject 0.25 mg into the skin once a week. For 4 weeks, then inject 0.5 mg into the skin once a week and continue at this dose., Disp: 1 pen, Rfl: 2 .  tiZANidine (ZANAFLEX) 4 MG capsule, Take 1 capsule (4 mg total) by mouth 2 (two) times daily as needed for muscle spasms., Disp: 60 capsule, Rfl: 0 .  traMADol (ULTRAM) 50 MG tablet, Limit one half to one tab by mouth per day or every other day if tolerated, Disp: 30 tablet, Rfl: 0  Current Facility-Administered Medications:  .  bupivacaine (PF) (MARCAINE) 0.25 % injection 30 mL, 30 mL, Other, Once, Mohammed Kindle, MD .  ceFAZolin (ANCEF) IVPB 1 g/50 mL premix, 1 g, Intravenous, Once, Mohammed Kindle, MD .  fentaNYL (SUBLIMAZE) injection 100 mcg, 100 mcg, Intravenous, Once, Mohammed Kindle, MD .  lactated ringers infusion 1,000 mL, 1,000 mL, Intravenous, Continuous, Mohammed Kindle, MD, Last Rate: 125 mL/hr at 01/30/16 0933, 1,000 mL at 01/30/16 0933 .  lactated ringers infusion 1,000 mL, 1,000 mL, Intravenous, Continuous, Mohammed Kindle, MD, Last Rate: 125 mL/hr at 03/10/16 1100, 1,000 mL at 03/10/16 1100 .  lactated ringers infusion 1,000 mL, 1,000 mL, Intravenous, Continuous, Mohammed Kindle, MD .  lidocaine (PF) (XYLOCAINE) 1 % injection 10 mL, 10 mL, Subcutaneous, Once, Mohammed Kindle, MD .  midazolam (VERSED) 5 MG/5ML injection 5 mg, 5 mg, Intravenous, Once, Mohammed Kindle, MD .  orphenadrine (NORFLEX) injection 60 mg, 60 mg, Intramuscular, Once, Mohammed Kindle, MD .  sodium chloride flush (NS) 0.9 % injection 20 mL, 20 mL, Other, Once, Mohammed Kindle, MD .   triamcinolone acetonide (KENALOG-40) injection 40 mg, 40 mg, Other, Once, Mohammed Kindle, MD  ROS  Constitutional: Denies any fever or chills Gastrointestinal: No reported hemesis, hematochezia, vomiting, or acute GI distress Musculoskeletal: Denies any acute onset joint swelling, redness, loss of ROM,  or weakness Neurological: No reported episodes of acute onset apraxia, aphasia, dysarthria, agnosia, amnesia, paralysis, loss of coordination, or loss of consciousness  Allergies  Stacy Martinez is allergic to prednisone.  PFSH  Drug: Stacy Martinez  reports that she does not use drugs. Alcohol:  reports that she does not drink alcohol. Tobacco:  reports that  has never smoked. she has never used smokeless tobacco. Medical:  has a past medical history of Allergy, Arthritis, Diabetes mellitus without complication ([place]), Enlarged heart, and Hypertension. Surgical: Stacy Martinez  has a past surgical history that includes Colon surgery; Hernia repair; and Wrist fracture surgery (Right). Family: family history includes Cancer in her father; Diabetes in her mother; Mental illness in her son.  Constitutional Exam  General appearance: Well nourished, well developed, and well hydrated. In no apparent acute distress Vitals:   07/09/17 1034  BP: (!) 157/69  Pulse: 63  Resp: 16  Temp: 97.9 F (36.6 C)  TempSrc: Oral  SpO2: 100%  Weight: 240 lb (108.9 kg)  Height: '5\' 2"'$  (1.575 m)   BMI Assessment: Estimated body mass index is 43.9 kg/m as calculated from the following:   Height as of this encounter: '5\' 2"'$  (1.575 m).   Weight as of this encounter: 240 lb (108.9 kg).  BMI interpretation table: BMI level Category Range association with higher incidence of chronic pain  <18 kg/m2 Underweight   18.5-24.9 kg/m2 Ideal body weight   25-29.9 kg/m2 Overweight Increased incidence by 20%  30-34.9 kg/m2 Obese (Class I) Increased incidence by 68%  35-39.9 kg/m2 Severe obesity (Class II) Increased incidence by 136%   >40 kg/m2 Extreme obesity (Class III) Increased incidence by 254%   BMI Readings from Last 4 Encounters:  07/09/17 43.90 kg/m  06/24/17 43.90 kg/m  06/02/17 43.90 kg/m  05/06/17 43.90 kg/m   Wt Readings from Last 4 Encounters:  07/09/17 240 lb (108.9 kg)  06/24/17 240 lb (108.9 kg)  06/02/17 240 lb (108.9 kg)  05/06/17 240 lb (108.9 kg)  Psych/Mental status: Alert, oriented x 3 (person, place, & time)       Eyes: PERLA Respiratory: No evidence of acute respiratory distress  Cervical Spine Area Exam  Skin & Axial Inspection: No masses, redness, edema, swelling, or associated skin lesions Alignment: Symmetrical Functional ROM: Unrestricted ROM      Stability: No instability detected Muscle Tone/Strength: Functionally intact. No obvious neuro-muscular anomalies detected. Sensory (Neurological): Unimpaired Palpation: No palpable anomalies              Upper Extremity (UE) Exam    Side: Right upper extremity  Side: Left upper extremity  Skin & Extremity Inspection: Skin color, temperature, and hair growth are WNL. No peripheral edema or cyanosis. No masses, redness, swelling, asymmetry, or associated skin lesions. No contractures.  Skin & Extremity Inspection: Skin color, temperature, and hair growth are WNL. No peripheral edema or cyanosis. No masses, redness, swelling, asymmetry, or associated skin lesions. No contractures.  Functional ROM: Unrestricted ROM          Functional ROM: Unrestricted ROM          Muscle Tone/Strength: Functionally intact. No obvious neuro-muscular anomalies detected.  Muscle Tone/Strength: Functionally intact. No obvious neuro-muscular anomalies detected.  Sensory (Neurological): Unimpaired          Sensory (Neurological): Unimpaired          Palpation: No palpable anomalies              Palpation: No palpable anomalies  Specialized Test(s): Deferred         Specialized Test(s): Deferred          Thoracic Spine Area Exam  Skin & Axial  Inspection: No masses, redness, or swelling Alignment: Symmetrical Functional ROM: Unrestricted ROM Stability: No instability detected Muscle Tone/Strength: Functionally intact. No obvious neuro-muscular anomalies detected. Sensory (Neurological): Unimpaired Muscle strength & Tone: No palpable anomalies  Lumbar Spine Area Exam  Skin & Axial Inspection: No masses, redness, or swelling Alignment: Symmetrical Functional ROM: Unrestricted ROM      Stability: No instability detected Muscle Tone/Strength: Functionally intact. No obvious neuro-muscular anomalies detected. Sensory (Neurological): Unimpaired Palpation: Complains of area being tender to palpation       Provocative Tests: Lumbar Hyperextension and rotation test: Positive left greater than right improved after treatment for 2 days Lumbar Lateral bending test: evaluation deferred today       Patrick's Maneuver: evaluation deferred today                   Tender to palpation overlying lumbar facets Gait & Posture Assessment  Ambulation: Unassisted Gait: Relatively normal for age and body habitus Posture: WNL   Lower Extremity Exam    Side: Right lower extremity  Side: Left lower extremity  Skin & Extremity Inspection: Skin color, temperature, and hair growth are WNL. No peripheral edema or cyanosis. No masses, redness, swelling, asymmetry, or associated skin lesions. No contractures.  Skin & Extremity Inspection: Skin color, temperature, and hair growth are WNL. No peripheral edema or cyanosis. No masses, redness, swelling, asymmetry, or associated skin lesions. No contractures.  Functional ROM: Unrestricted ROM          Functional ROM: Unrestricted ROM          Muscle Tone/Strength: Functionally intact. No obvious neuro-muscular anomalies detected.  Muscle Tone/Strength: Functionally intact. No obvious neuro-muscular anomalies detected.  Sensory (Neurological): Unimpaired  Sensory (Neurological): Unimpaired  Palpation: No palpable  anomalies  Palpation: No palpable anomalies   Assessment  Primary Diagnosis & Pertinent Problem List: The primary encounter diagnosis was Spondylosis without myelopathy or radiculopathy, lumbar region. Diagnoses of Facet syndrome, lumbar, DDD (degenerative disc disease), lumbar, and Lumbar spondylosis were also pertinent to this visit.  Status Diagnosis  Responding Responding Stable 1. Spondylosis without myelopathy or radiculopathy, lumbar region   2. Facet syndrome, lumbar   3. DDD (degenerative disc disease), lumbar   4. Lumbar spondylosis      61 year old female with a past medical history of cardiomyopathy, hypertension, hyperlipidemia with a history of chronic low back pain secondary to lumbar spondylosis without myelopathy, lumbar spinal stenosis, degenerative joint disease of bilateral knees and SI joint arthralgia. Patient is status post bilateral lumbar facet blocks 05/06/17, 06/24/17 which resulted in 100% improvement in pain for approximately 2-3 days with both blocks. Patient had increased range of motion, ability to tolerate ambulating for a longer period of time, inability to perform activities of daily living without as much pain. This signifies a positive diagnostic nerve block and patient would like to proceed with radiofrequency ablation starting with the left side first.  Plan: -Radiofrequency ablation of left lumbar facets at L3/L4, L4/L5, L5/S1. We will perform right side after that. -Continue all other medications as prescribed  Lab-work, procedure(s), and/or referral(s): Orders Placed This Encounter  Procedures  . Radiofrequency,Lumbar    Pharmacological management options:  Opioid Analgesics: The patient was informed that there is no guarantee that she would be a candidate for opioid analgesics. The decision will  be made following CDC guidelines. This decision will be based on the results of diagnostic studies, as well as Stacy Martinez's risk profile.   Membrane  stabilizer: Consider gabapentin, Lyrica, Topamax  Muscle relaxant: Trial of tizanidine which the patient finds effective.. Can consider baclofen, Robaxin. Patient states she became sedated with Flexeril.  NSAID: Trial of meloxicam for 14 days. Can consider diclofenac trial the future. Do not recommend chronic NSAID therapy. Check creatinine before prescription of NSAIDs.  Other analgesic(s): Voltaren gel trial today. Consider TENS, lidocaine ointment, lidocaine patches.   Interventional management options: Stacy Martinez was informed that there is no guarantee that she would be a candidate for interventional therapies. The decision will be based on the results of diagnostic studies, as well as Stacy Martinez's risk profile.  Procedure(s) under consideration:  -lumbar radiofrequency ablation, start with left first. -Lumbar epidural steroid injection -SI joint injection -piriformis injection -Bilateral genicular nerves block      Provider-requested follow-up: Return in about 3 weeks (around 07/27/2017) for Procedure.  Future Appointments  Date Time Provider [place]  07/21/2017  8:00 AM Mikey College, NP [facility] None    Primary Care Physician: Mikey College, NP Location: [facility] Note by: Gillis Santa, M.D Date: 07/09/2017; Time: 3:30 PM  Patient Instructions  Follow up for Left RFA Nov  26  Radiofrequency Lesioning Radiofrequency lesioning is a procedure that is performed to relieve pain. The procedure is often used for back, neck, or arm pain. Radiofrequency lesioning involves the use of a machine that creates radio waves to make heat. During the procedure, the heat is applied to the nerve that carries the pain signal. The heat damages the nerve and interferes with the pain signal. Pain relief usually starts about 2 weeks after the procedure and lasts for 6 months to 1 year. Tell a health care provider about:  Any allergies you  have.  All medicines you are taking, including vitamins, herbs, eye drops, creams, and over-the-counter medicines.  Any problems you or family members have had with anesthetic medicines.  Any blood disorders you have.  Any surgeries you have had.  Any medical conditions you have.  Whether you are pregnant or may be pregnant. What are the risks? Generally, this is a safe procedure. However, problems may occur, including:  Pain or soreness at the injection site.  Infection at the injection site.  Damage to nerves or blood vessels.  What happens before the procedure?  Ask your health care provider about: ? Changing or stopping your regular medicines. This is especially important if you are taking diabetes medicines or blood thinners. ? Taking medicines such as aspirin and ibuprofen. These medicines can thin your blood. Do not take these medicines before your procedure if your health care provider instructs you not to.  Follow instructions from your health care provider about eating or drinking restrictions.  Plan to have someone take you home after the procedure.  If you go home right after the procedure, plan to have someone with you for 24 hours. What happens during the procedure?  You will be given one or more of the following: ? A medicine to help you relax (sedative). ? A medicine to numb the area (local anesthetic).  You will be awake during the procedure. You will need to be able to talk with the health care provider during the procedure.  With the help of a type of X-ray (fluoroscopy), the health care provider will insert a radiofrequency  needle into the area to be treated.  Next, a wire that carries the radio waves (electrode) will be put through the radiofrequency needle. An electrical pulse will be sent through the electrode to verify the correct nerve. You will feel a tingling sensation, and you may have muscle twitching.  Then, the tissue that is around the  needle tip will be heated by an electric current that is passed using the radiofrequency machine. This will numb the nerves.  A bandage (dressing) will be put on the insertion area after the procedure is done. The procedure may vary among health care providers and hospitals. What happens after the procedure?  Your blood pressure, heart rate, breathing rate, and blood oxygen level will be monitored often until the medicines you were given have worn off.  Return to your normal activities as directed by your health care provider. This information is not intended to replace advice given to you by your health care provider. Make sure you discuss any questions you have with your health care provider. Document Released: 04/16/2011 Document Revised: 01/24/2016 Document Reviewed: 09/25/2014 Elsevier Interactive Patient Education  [employer].

## 2017-07-09 NOTE — Patient Instructions (Addendum)
Follow up for Left RFA Nov  26  Radiofrequency Lesioning Radiofrequency lesioning is a procedure that is performed to relieve pain. The procedure is often used for back, neck, or arm pain. Radiofrequency lesioning involves the use of a machine that creates radio waves to make heat. During the procedure, the heat is applied to the nerve that carries the pain signal. The heat damages the nerve and interferes with the pain signal. Pain relief usually starts about 2 weeks after the procedure and lasts for 6 months to 1 year. Tell a health care provider about:  Any allergies you have.  All medicines you are taking, including vitamins, herbs, eye drops, creams, and over-the-counter medicines.  Any problems you or family members have had with anesthetic medicines.  Any blood disorders you have.  Any surgeries you have had.  Any medical conditions you have.  Whether you are pregnant or may be pregnant. What are the risks? Generally, this is a safe procedure. However, problems may occur, including:  Pain or soreness at the injection site.  Infection at the injection site.  Damage to nerves or blood vessels.  What happens before the procedure?  Ask your health care provider about: ? Changing or stopping your regular medicines. This is especially important if you are taking diabetes medicines or blood thinners. ? Taking medicines such as aspirin and ibuprofen. These medicines can thin your blood. Do not take these medicines before your procedure if your health care provider instructs you not to.  Follow instructions from your health care provider about eating or drinking restrictions.  Plan to have someone take you home after the procedure.  If you go home right after the procedure, plan to have someone with you for 24 hours. What happens during the procedure?  You will be given one or more of the following: ? A medicine to help you relax (sedative). ? A medicine to numb the area  (local anesthetic).  You will be awake during the procedure. You will need to be able to talk with the health care provider during the procedure.  With the help of a type of X-ray (fluoroscopy), the health care provider will insert a radiofrequency needle into the area to be treated.  Next, a wire that carries the radio waves (electrode) will be put through the radiofrequency needle. An electrical pulse will be sent through the electrode to verify the correct nerve. You will feel a tingling sensation, and you may have muscle twitching.  Then, the tissue that is around the needle tip will be heated by an electric current that is passed using the radiofrequency machine. This will numb the nerves.  A bandage (dressing) will be put on the insertion area after the procedure is done. The procedure may vary among health care providers and hospitals. What happens after the procedure?  Your blood pressure, heart rate, breathing rate, and blood oxygen level will be monitored often until the medicines you were given have worn off.  Return to your normal activities as directed by your health care provider. This information is not intended to replace advice given to you by your health care provider. Make sure you discuss any questions you have with your health care provider. Document Released: 04/16/2011 Document Revised: 01/24/2016 Document Reviewed: 09/25/2014 Elsevier Interactive Patient Education  [employer].

## 2017-07-14 ENCOUNTER — Other Ambulatory Visit: Payer: Self-pay | Admitting: Student in an Organized Health Care Education/Training Program

## 2017-07-21 ENCOUNTER — Ambulatory Visit: Payer: BLUE CROSS/BLUE SHIELD | Admitting: Nurse Practitioner

## 2017-08-16 ENCOUNTER — Other Ambulatory Visit: Payer: Self-pay | Admitting: Nurse Practitioner

## 2017-08-16 DIAGNOSIS — I10 Essential (primary) hypertension: Secondary | ICD-10-CM

## 2017-08-26 ENCOUNTER — Other Ambulatory Visit: Payer: Self-pay | Admitting: Student in an Organized Health Care Education/Training Program

## 2017-08-26 ENCOUNTER — Telehealth: Payer: Self-pay

## 2017-08-26 DIAGNOSIS — M47816 Spondylosis without myelopathy or radiculopathy, lumbar region: Secondary | ICD-10-CM

## 2017-08-26 NOTE — Telephone Encounter (Signed)
-----   Message from Gillis Santa, MD sent at 08/26/2017  1:35 PM EST ----- Yes, I responded to [facility]- order placed and we can bring her in for b/l facet blocks until her RFA gets approved

## 2017-08-26 NOTE — Telephone Encounter (Signed)
Dr Holley Raring,  Are you Ok with this message. Please advise so we can let the patient know.  Thanks

## 2017-08-26 NOTE — Telephone Encounter (Signed)
Ok to schedule bilateral lumbar facet before RF is approved Per Dr Holley Raring. Please call patient to schedule

## 2017-09-07 ENCOUNTER — Encounter: Payer: Self-pay | Admitting: Student in an Organized Health Care Education/Training Program

## 2017-09-07 ENCOUNTER — Ambulatory Visit
Admission: RE | Admit: 2017-09-07 | Discharge: 2017-09-07 | Disposition: A | Discharge status: Home / Self Care | Payer: BLUE CROSS/BLUE SHIELD | Source: Ambulatory Visit | Attending: Student in an Organized Health Care Education/Training Program | Admitting: Student in an Organized Health Care Education/Training Program

## 2017-09-07 ENCOUNTER — Ambulatory Visit (HOSPITAL_BASED_OUTPATIENT_CLINIC_OR_DEPARTMENT_OTHER): Payer: BLUE CROSS/BLUE SHIELD | Admitting: Student in an Organized Health Care Education/Training Program

## 2017-09-07 VITALS — BP 156/70 | HR 62 | Temp 97.8°F | Resp 16 | Ht 62.0 in | Wt 240.0 lb

## 2017-09-07 DIAGNOSIS — Z9889 Other specified postprocedural states: Secondary | ICD-10-CM | POA: Diagnosis not present

## 2017-09-07 DIAGNOSIS — Z8781 Personal history of (healed) traumatic fracture: Secondary | ICD-10-CM | POA: Diagnosis not present

## 2017-09-07 DIAGNOSIS — M47816 Spondylosis without myelopathy or radiculopathy, lumbar region: Secondary | ICD-10-CM | POA: Insufficient documentation

## 2017-09-07 DIAGNOSIS — Z888 Allergy status to other drugs, medicaments and biological substances status: Secondary | ICD-10-CM | POA: Insufficient documentation

## 2017-09-07 MED ORDER — LIDOCAINE HCL (PF) 1 % IJ SOLN
10.0000 mL | Freq: Once | INTRAMUSCULAR | Status: AC
  Administered 2017-09-07: 10 mL
  Filled 2017-09-07: qty 10

## 2017-09-07 MED ORDER — ROPIVACAINE HCL 2 MG/ML IJ SOLN
10.0000 mL | Freq: Once | INTRAMUSCULAR | Status: AC
  Administered 2017-09-07: 10 mL
  Filled 2017-09-07: qty 10

## 2017-09-07 MED ORDER — FENTANYL CITRATE (PF) 100 MCG/2ML IJ SOLN
25.0000 ug | INTRAMUSCULAR | Status: DC | PRN
  Administered 2017-09-07: 75 ug via INTRAVENOUS
  Filled 2017-09-07: qty 2

## 2017-09-07 MED ORDER — LACTATED RINGERS IV SOLN
1000.0000 mL | Freq: Once | INTRAVENOUS | Status: AC
  Administered 2017-09-07: 1000 mL via INTRAVENOUS

## 2017-09-07 MED ORDER — DEXAMETHASONE SODIUM PHOSPHATE 10 MG/ML IJ SOLN
10.0000 mg | Freq: Once | INTRAMUSCULAR | Status: AC
  Administered 2017-09-07: 10 mg
  Filled 2017-09-07: qty 1

## 2017-09-07 NOTE — Progress Notes (Signed)
Safety precautions to be maintained throughout the outpatient stay will include: orient to surroundings, keep bed in low position, maintain call bell within reach at all times, provide assistance with transfer out of bed and ambulation.  

## 2017-09-07 NOTE — Patient Instructions (Signed)

## 2017-09-07 NOTE — Progress Notes (Signed)
Patient's Name: Stacy Martinez  MRN: [id number]  Referring Provider: Mikey College, *  DOB: 1956-08-28  PCP: Mikey College, NP  DOS: 09/07/2017  Note by: Gillis Santa, MD  Service setting: Ambulatory outpatient  Specialty: Interventional Pain Management  Patient type: Established  Location: [facility] (AMB) Pain Management Facility  Visit type: Interventional Procedure   Primary Reason for Visit: Interventional Pain Management Treatment. CC: Back Pain (lower bilateral)  Procedure:  Anesthesia, Analgesia, Anxiolysis:  Type: Therapeutic Medial Branch Facet Block #3 with steroid Region: Lumbar Level:  L3, L4, L5,  Medial Branch Level(s) Laterality: Bilateral  Type: Local Anesthesia with Moderate (Conscious) Sedation Local Anesthetic: Lidocaine 1% Route: Intravenous (IV) IV Access: Secured Sedation: Meaningful verbal contact was maintained at all times during the procedure  Indication(s): Analgesia and Anxiety   Indications: 1. Spondylosis without myelopathy or radiculopathy, lumbar region   2. Facet syndrome, lumbar   3. Lumbar spondylosis    Pain Score: Pre-procedure: 5 /10 Post-procedure: 0-No pain/10  Pre-op Assessment:  Stacy Martinez is a 62 y.o. (year old), female patient, seen today for interventional treatment. She  has a past surgical history that includes Colon surgery; Hernia repair; and Wrist fracture surgery (Right). Stacy Martinez has a current medication list which includes the following prescription(s): amlodipine, carvedilol, empagliflozin, enalapril, furosemide, glucose blood, relion ultra thin lancets, rosuvastatin, semaglutide, tizanidine, and tramadol, and the following Facility-Administered Medications: bupivacaine (pf), cefazolin, fentanyl, fentanyl, lactated ringers, lactated ringers, lactated ringers, lactated ringers, lidocaine (pf), midazolam, orphenadrine, sodium chloride flush, and triamcinolone acetonide. Her primarily concern today is the Back Pain (lower  bilateral)  Initial Vital Signs: There were no vitals taken for this visit. BMI: Estimated body mass index is 43.9 kg/m as calculated from the following:   Height as of this encounter: 5\' 2"  (1.575 m).   Weight as of this encounter: 240 lb (108.9 kg).  Risk Assessment: Allergies: Reviewed. She is allergic to prednisone.  Allergy Precautions: None required Coagulopathies: Reviewed. None identified.  Blood-thinner therapy: None at this time Active Infection(s): Reviewed. None identified. Stacy Martinez is afebrile  Site Confirmation: Stacy Martinez was asked to confirm the procedure and laterality before marking the site Procedure checklist: Completed Consent: Before the procedure and under the influence of no sedative(s), amnesic(s), or anxiolytics, the patient was informed of the treatment options, risks and possible complications. To fulfill our ethical and legal obligations, as recommended by the American Medical Association's Code of Ethics, I have informed the patient of my clinical impression; the nature and purpose of the treatment or procedure; the risks, benefits, and possible complications of the intervention; the alternatives, including doing nothing; the risk(s) and benefit(s) of the alternative treatment(s) or procedure(s); and the risk(s) and benefit(s) of doing nothing. The patient was provided information about the general risks and possible complications associated with the procedure. These may include, but are not limited to: failure to achieve desired goals, infection, bleeding, organ or nerve damage, allergic reactions, paralysis, and death. In addition, the patient was informed of those risks and complications associated to Spine-related procedures, such as failure to decrease pain; infection (i.e.: Meningitis, epidural or intraspinal abscess); bleeding (i.e.: epidural hematoma, subarachnoid hemorrhage, or any other type of intraspinal or peri-dural bleeding); organ or nerve damage (i.e.:  Any type of peripheral nerve, nerve root, or spinal cord injury) with subsequent damage to sensory, motor, and/or autonomic systems, resulting in permanent pain, numbness, and/or weakness of one or several areas of the body; allergic reactions; (i.e.: anaphylactic reaction); and/or  death. Furthermore, the patient was informed of those risks and complications associated with the medications. These include, but are not limited to: allergic reactions (i.e.: anaphylactic or anaphylactoid reaction(s)); adrenal axis suppression; blood sugar elevation that in diabetics may result in ketoacidosis or comma; water retention that in patients with history of congestive heart failure may result in shortness of breath, pulmonary edema, and decompensation with resultant heart failure; weight gain; swelling or edema; medication-induced neural toxicity; particulate matter embolism and blood vessel occlusion with resultant organ, and/or nervous system infarction; and/or aseptic necrosis of one or more joints. Finally, the patient was informed that Medicine is not an exact science; therefore, there is also the possibility of unforeseen or unpredictable risks and/or possible complications that may result in a catastrophic outcome. The patient indicated having understood very clearly. We have given the patient no guarantees and we have made no promises. Enough time was given to the patient to ask questions, all of which were answered to the patient's satisfaction. Stacy Martinez has indicated that she wanted to continue with the procedure. Attestation: I, the ordering provider, attest that I have discussed with the patient the benefits, risks, side-effects, alternatives, likelihood of achieving goals, and potential problems during recovery for the procedure that I have provided informed consent. Date: 09/07/2017; Time: 10:30 AM  Pre-Procedure Preparation:  Monitoring: As per clinic protocol. Respiration, ETCO2, SpO2, BP, heart rate and  rhythm monitor placed and checked for adequate function Safety Precautions: Patient was assessed for positional comfort and pressure points before starting the procedure. Time-out: I initiated and conducted the "Time-out" before starting the procedure, as per protocol. The patient was asked to participate by confirming the accuracy of the "Time Out" information. Verification of the correct person, site, and procedure were performed and confirmed by me, the nursing staff, and the patient. "Time-out" conducted as per Joint Commission's Universal Protocol (UP.01.01.01). "Time-out" Date & Time: 09/07/2017; 1050 hrs.  Description of Procedure Process:   Position: Prone Target Area: For Lumbar Facet blocks, the target is the groove formed by the junction of the transverse process and superior articular process. For the L5 dorsal ramus, the target is the notch between superior articular process and sacral ala.  Approach: Paramedial approach. Area Prepped: Entire Posterior Lumbosacral Region Prepping solution: ChloraPrep (2% chlorhexidine gluconate and 70% isopropyl alcohol) Safety Precautions: Aspiration looking for blood return was conducted prior to all injections. At no point did we inject any substances, as a needle was being advanced. No attempts were made at seeking any paresthesias. Safe injection practices and needle disposal techniques used. Medications properly checked for expiration dates. SDV (single dose vial) medications used. Description of the Procedure: Protocol guidelines were followed. The patient was placed in position over the fluoroscopy table. The target area was identified and the area prepped in the usual manner. Skin desensitized using vapocoolant spray. Skin & deeper tissues infiltrated with local anesthetic. Appropriate amount of time allowed to pass for local anesthetics to take effect. The procedure needle was introduced through the skin, ipsilateral to the reported pain, and  advanced to the target area. Employing the "Medial Branch Technique", the needles were advanced to the angle made by the superior and medial portion of the transverse process, and the lateral and inferior portion of the superior articulating process of the targeted vertebral bodies. This area is known as "Burton's Eye" or the "Eye of the [place] Dog". A procedure needle was introduced through the skin, and this time advanced to the angle made by  the superior and medial border of the sacral ala.  Negative aspiration confirmed. Solution injected in intermittent fashion, asking for systemic symptoms every 0.5cc of injectate. The needles were then removed and the area cleansed, making sure to leave some of the prepping solution back to take advantage of its long term bactericidal properties.   Illustration of the posterior view of the lumbar spine and the posterior neural structures. Laminae of L2 through S1 are labeled. DPRL5, dorsal primary ramus of L5; DPRS1, dorsal primary ramus of S1; DPR3, dorsal primary ramus of L3; FJ, facet (zygapophyseal) joint L3-L4; I, inferior articular process of L4; LB1, lateral branch of dorsal primary ramus of L1; IAB, inferior articular branches from L3 medial branch (supplies L4-L5 facet joint); IBP, intermediate branch plexus; MB3, medial branch of dorsal primary ramus of L3; NR3, third lumbar nerve root; S, superior articular process of L5; SAB, superior articular branches from L4 (supplies L4-5 facet joint also); TP3, transverse process of L3.  Vitals:   09/07/17 1109 09/07/17 1118 09/07/17 1128 09/07/17 1137  BP: 124/68 (!) 145/69 (!) 172/61 (!) 156/70  Pulse:      Resp: 10 13 16 16   Temp:  97.8 F (36.6 C)    TempSrc:  Tympanic    SpO2: 93% 98% 97% 99%  Weight:      Height:        Start Time: 1054 hrs. End Time: 1108 hrs. Materials:  Needle(s) Type: Regular needle Gauge: 22G Length: 3.5-in Medication(s): We administered lactated ringers, fentaNYL,  lidocaine (PF), ropivacaine (PF) 2 mg/mL (0.2%), and dexamethasone. Please see chart orders for dosing details. 10 cc solution made of 9 cc of 0.2% Ropivacaine and 1 cc of Decadron, injected 1-1.5 cc  at each level  Imaging Guidance (Spinal):  Type of Imaging Technique: Fluoroscopy Guidance (Spinal) Indication(s): Assistance in needle guidance and placement for procedures requiring needle placement in or near specific anatomical locations not easily accessible without such assistance. Exposure Time: Please see nurses notes. Contrast: None used. Fluoroscopic Guidance: I was personally present during the use of fluoroscopy. "Tunnel Vision Technique" used to obtain the best possible view of the target area. Parallax error corrected before commencing the procedure. "Direction-depth-direction" technique used to introduce the needle under continuous pulsed fluoroscopy. Once target was reached, antero-posterior, oblique, and lateral fluoroscopic projection used confirm needle placement in all planes. Images permanently stored in EMR. Interpretation: No contrast injected. I personally interpreted the imaging intraoperatively. Adequate needle placement confirmed in multiple planes. Permanent images saved into the patient's record.  Antibiotic Prophylaxis:  Indication(s): None identified Antibiotic given: None  Post-operative Assessment:  EBL: None Complications: No immediate post-treatment complications observed by team, or reported by patient. Note: The patient tolerated the entire procedure well. A repeat set of vitals were taken after the procedure and the patient was kept under observation following institutional policy, for this type of procedure. Post-procedural neurological assessment was performed, showing return to baseline, prior to discharge. The patient was provided with post-procedure discharge instructions, including a section on how to identify potential problems. Should any problems arise  concerning this procedure, the patient was given instructions to immediately contact us, at any time, without hesitation. In any case, we plan to contact the patient by telephone for a follow-up status report regarding this interventional procedure. Comments:  No additional relevant information. 5 out of 5 strength bilateral lower extremity: Plantar flexion, dorsiflexion, knee flexion, knee extension.  Plan of Care  Follow-up in 4 weeks for postprocedure evaluation. Patient recommended  to do PT to help with low back sx and also to proceed with RFA.   Imaging Orders     DG C-Arm 1-60 Min-No Report Procedure Orders    No procedure(s) ordered today    Medications ordered for procedure: Meds ordered this encounter  Medications  . lactated ringers infusion 1,000 mL  . fentaNYL (SUBLIMAZE) injection 25-100 mcg    Make sure Narcan is available in the pyxis when using this medication. In the event of respiratory depression (RR< 8/min): Titrate NARCAN (naloxone) in increments of 0.1 to 0.2 mg IV at 2-3 minute intervals, until desired degree of reversal.  . lidocaine (PF) (XYLOCAINE) 1 % injection 10 mL  . ropivacaine (PF) 2 mg/mL (0.2%) (NAROPIN) injection 10 mL  . dexamethasone (DECADRON) injection 10 mg   Medications administered: We administered lactated ringers, fentaNYL, lidocaine (PF), ropivacaine (PF) 2 mg/mL (0.2%), and dexamethasone.  See the medical record for exact dosing, route, and time of administration.  This SmartLink is deprecated. Use AVSMEDLIST instead to display the medication list for a patient. Disposition: Discharge home  Discharge Date & Time: 09/07/2017; 1137 hrs.   Physician-requested Follow-up: Return in about 4 weeks (around 10/05/2017) for Post Procedure Evaluation. Future Appointments  Date Time Provider [place]  10/06/2017  8:45 AM Gillis Santa, MD [facility] None   Primary Care Physician: Mikey College, NP Location: [facility] Note by: Gillis Santa, MD Date: 09/07/2017; Time: 11:52 AM  Disclaimer:  Medicine is not an exact science. The only guarantee in medicine is that nothing is guaranteed. It is important to note that the decision to proceed with this intervention was based on the information collected from the patient. The Data and conclusions were drawn from the patient's questionnaire, the interview, and the physical examination. Because the information was provided in large part by the patient, it cannot be guaranteed that it has not been purposely or unconsciously manipulated. Every effort has been made to obtain as much relevant data as possible for this evaluation. It is important to note that the conclusions that lead to this procedure are derived in large part from the available data. Always take into account that the treatment will also be dependent on availability of resources and existing treatment guidelines, considered by other Pain Management Practitioners as being common knowledge and practice, at the time of the intervention. For Medico-Legal purposes, it is also important to point out that variation in procedural techniques and pharmacological choices are the acceptable norm. The indications, contraindications, technique, and results of the above procedure should only be interpreted and judged by a Board-Certified Interventional Pain Specialist with extensive familiarity and expertise in the same exact procedure and technique.

## 2017-09-08 ENCOUNTER — Telehealth: Payer: Self-pay | Admitting: *Deleted

## 2017-09-08 NOTE — Telephone Encounter (Signed)
Attempted to call for post procedure follow-up. Unable to leave message, mailbox is full.

## 2017-09-10 ENCOUNTER — Telehealth: Payer: Self-pay | Admitting: *Deleted

## 2017-09-10 ENCOUNTER — Other Ambulatory Visit: Payer: Self-pay | Admitting: *Deleted

## 2017-09-10 MED ORDER — TIZANIDINE HCL 4 MG PO CAPS: 4.0000 mg | ORAL_CAPSULE | Freq: Two times a day (BID) | ORAL | 0 refills | Status: DC | PRN

## 2017-09-10 NOTE — Telephone Encounter (Signed)
Patient notified that Tizanidine sent to pharmacy.

## 2017-09-10 NOTE — Telephone Encounter (Signed)
Called Dr. Holley Raring to ask if this can be called in. Message left.

## 2017-09-10 NOTE — Telephone Encounter (Signed)
Ok to refill per Dr. Holley Raring. Attempted to notify patient, unable to leave a message.

## 2017-10-06 ENCOUNTER — Ambulatory Visit
Payer: BLUE CROSS/BLUE SHIELD | Attending: Student in an Organized Health Care Education/Training Program | Admitting: Student in an Organized Health Care Education/Training Program

## 2017-10-06 ENCOUNTER — Encounter: Payer: Self-pay | Admitting: Student in an Organized Health Care Education/Training Program

## 2017-10-06 ENCOUNTER — Other Ambulatory Visit: Payer: Self-pay

## 2017-10-06 VITALS — BP 150/74 | HR 79 | Temp 97.7°F | Resp 16 | Ht 62.0 in | Wt 240.0 lb

## 2017-10-06 DIAGNOSIS — M47816 Spondylosis without myelopathy or radiculopathy, lumbar region: Secondary | ICD-10-CM | POA: Insufficient documentation

## 2017-10-06 DIAGNOSIS — G8929 Other chronic pain: Secondary | ICD-10-CM | POA: Insufficient documentation

## 2017-10-06 DIAGNOSIS — I429 Cardiomyopathy, unspecified: Secondary | ICD-10-CM | POA: Insufficient documentation

## 2017-10-06 DIAGNOSIS — E1140 Type 2 diabetes mellitus with diabetic neuropathy, unspecified: Secondary | ICD-10-CM | POA: Diagnosis not present

## 2017-10-06 DIAGNOSIS — E785 Hyperlipidemia, unspecified: Secondary | ICD-10-CM | POA: Diagnosis not present

## 2017-10-06 DIAGNOSIS — Z888 Allergy status to other drugs, medicaments and biological substances status: Secondary | ICD-10-CM | POA: Diagnosis not present

## 2017-10-06 DIAGNOSIS — I10 Essential (primary) hypertension: Secondary | ICD-10-CM | POA: Insufficient documentation

## 2017-10-06 DIAGNOSIS — M5136 Other intervertebral disc degeneration, lumbar region: Secondary | ICD-10-CM

## 2017-10-06 DIAGNOSIS — Z79899 Other long term (current) drug therapy: Secondary | ICD-10-CM | POA: Diagnosis not present

## 2017-10-06 NOTE — Patient Instructions (Signed)
GENERAL RISKS AND COMPLICATIONS  What are the risk, side effects and possible complications? Generally speaking, most procedures are safe.  However, with any procedure there are risks, side effects, and the possibility of complications.  The risks and complications are dependent upon the sites that are lesioned, or the type of nerve block to be performed.  The closer the procedure is to the spine, the more serious the risks are.  Great care is taken when placing the radio frequency needles, block needles or lesioning probes, but sometimes complications can occur. 1. Infection: Any time there is an injection through the skin, there is a risk of infection.  This is why sterile conditions are used for these blocks.  There are four possible types of infection. 1. Localized skin infection. 2. Central Nervous System Infection-This can be in the form of Meningitis, which can be deadly. 3. Epidural Infections-This can be in the form of an epidural abscess, which can cause pressure inside of the spine, causing compression of the spinal cord with subsequent paralysis. This would require an emergency surgery to decompress, and there are no guarantees that the patient would recover from the paralysis. 4. Discitis-This is an infection of the intervertebral discs.  It occurs in about 1% of discography procedures.  It is difficult to treat and it may lead to surgery.        2. Pain: the needles have to go through skin and soft tissues, will cause soreness.       3. Damage to internal structures:  The nerves to be lesioned may be near blood vessels or    other nerves which can be potentially damaged.       4. Bleeding: Bleeding is more common if the patient is taking blood thinners such as  aspirin, Coumadin, Ticiid, Plavix, etc., or if he/she have some genetic predisposition  such as hemophilia. Bleeding into the spinal canal can cause compression of the spinal  cord with subsequent paralysis.  This would require an  emergency surgery to  decompress and there are no guarantees that the patient would recover from the  paralysis.       5. Pneumothorax:  Puncturing of a lung is a possibility, every time a needle is introduced in  the area of the chest or upper back.  Pneumothorax refers to free air around the  collapsed lung(s), inside of the thoracic cavity (chest cavity).  Another two possible  complications related to a similar event would include: Hemothorax and Chylothorax.   These are variations of the Pneumothorax, where instead of air around the collapsed  lung(s), you may have blood or chyle, respectively.       6. Spinal headaches: They may occur with any procedures in the area of the spine.       7. Persistent CSF (Cerebro-Spinal Fluid) leakage: This is a rare problem, but may occur  with prolonged intrathecal or epidural catheters either due to the formation of a fistulous  track or a dural tear.       8. Nerve damage: By working so close to the spinal cord, there is always a possibility of  nerve damage, which could be as serious as a permanent spinal cord injury with  paralysis.       9. Death:  Although rare, severe deadly allergic reactions known as "Anaphylactic  reaction" can occur to any of the medications used.      10. Worsening of the symptoms:  We can always make thing worse.  What are the chances of something like this happening? Chances of any of this occuring are extremely low.  By statistics, you have more of a chance of getting killed in a motor vehicle accident: while driving to the hospital than any of the above occurring .  Nevertheless, you should be aware that they are possibilities.  In general, it is similar to taking a shower.  Everybody knows that you can slip, hit your head and get killed.  Does that mean that you should not shower again?  Nevertheless always keep in mind that statistics do not mean anything if you happen to be on the wrong side of them.  Even if a procedure has a 1  (one) in a 1,000,000 (million) chance of going wrong, it you happen to be that one..Also, keep in mind that by statistics, you have more of a chance of having something go wrong when taking medications.  Who should not have this procedure? If you are on a blood thinning medication (e.g. Coumadin, Plavix, see list of "Blood Thinners"), or if you have an active infection going on, you should not have the procedure.  If you are taking any blood thinners, please inform your physician.  How should I prepare for this procedure?  Do not eat or drink anything at least six hours prior to the procedure.  Bring a driver with you .  It cannot be a taxi.  Come accompanied by an adult that can drive you back, and that is strong enough to help you if your legs get weak or numb from the local anesthetic.  Take all of your medicines the morning of the procedure with just enough water to swallow them.  If you have diabetes, make sure that you are scheduled to have your procedure done first thing in the morning, whenever possible.  If you have diabetes, take only half of your insulin dose and notify our nurse that you have done so as soon as you arrive at the clinic.  If you are diabetic, but only take blood sugar pills (oral hypoglycemic), then do not take them on the morning of your procedure.  You may take them after you have had the procedure.  Do not take aspirin or any aspirin-containing medications, at least eleven (11) days prior to the procedure.  They may prolong bleeding.  Wear loose fitting clothing that may be easy to take off and that you would not mind if it got stained with Betadine or blood.  Do not wear any jewelry or perfume  Remove any nail coloring.  It will interfere with some of our monitoring equipment.  NOTE: Remember that this is not meant to be interpreted as a complete list of all possible complications.  Unforeseen problems may occur.  BLOOD THINNERS The following drugs  contain aspirin or other products, which can cause increased bleeding during surgery and should not be taken for 2 weeks prior to and 1 week after surgery.  If you should need take something for relief of minor pain, you may take acetaminophen which is found in Tylenol,m Datril, Anacin-3 and Panadol. It is not blood thinner. The products listed below are.  Do not take any of the products listed below in addition to any listed on your instruction sheet.  A.P.C or A.P.C with Codeine Codeine Phosphate Capsules #3 Ibuprofen Ridaura  ABC compound Congesprin Imuran rimadil  Advil Cope Indocin Robaxisal  Alka-Seltzer Effervescent Pain Reliever and Antacid Coricidin or Coricidin-D  Indomethacin Rufen  Alka-Seltzer plus Cold Medicine Cosprin Ketoprofen S-A-C Tablets  Anacin Analgesic Tablets or Capsules Coumadin Korlgesic Salflex  Anacin Extra Strength Analgesic tablets or capsules CP-2 Tablets Lanoril Salicylate  Anaprox Cuprimine Capsules Levenox Salocol  Anexsia-D Dalteparin Magan Salsalate  Anodynos Darvon compound Magnesium Salicylate Sine-off  Ansaid Dasin Capsules Magsal Sodium Salicylate  Anturane Depen Capsules Marnal Soma  APF Arthritis pain formula Dewitt's Pills Measurin Stanback  Argesic Dia-Gesic Meclofenamic Sulfinpyrazone  Arthritis Bayer Timed Release Aspirin Diclofenac Meclomen Sulindac  Arthritis pain formula Anacin Dicumarol Medipren Supac  Analgesic (Safety coated) Arthralgen Diffunasal Mefanamic Suprofen  Arthritis Strength Bufferin Dihydrocodeine Mepro Compound Suprol  Arthropan liquid Dopirydamole Methcarbomol with Aspirin Synalgos  ASA tablets/Enseals Disalcid Micrainin Tagament  Ascriptin Doan's Midol Talwin  Ascriptin A/D Dolene Mobidin Tanderil  Ascriptin Extra Strength Dolobid Moblgesic Ticlid  Ascriptin with Codeine Doloprin or Doloprin with Codeine Momentum Tolectin  Asperbuf Duoprin Mono-gesic Trendar  Aspergum Duradyne Motrin or Motrin IB Triminicin  Aspirin  plain, buffered or enteric coated Durasal Myochrisine Trigesic  Aspirin Suppositories Easprin Nalfon Trillsate  Aspirin with Codeine Ecotrin Regular or Extra Strength Naprosyn Uracel  Atromid-S Efficin Naproxen Ursinus  Auranofin Capsules Elmiron Neocylate Vanquish  Axotal Emagrin Norgesic Verin  Azathioprine Empirin or Empirin with Codeine Normiflo Vitamin E  Azolid Emprazil Nuprin Voltaren  Bayer Aspirin plain, buffered or children's or timed BC Tablets or powders Encaprin Orgaran Warfarin Sodium  Buff-a-Comp Enoxaparin Orudis Zorpin  Buff-a-Comp with Codeine Equegesic Os-Cal-Gesic   Buffaprin Excedrin plain, buffered or Extra Strength Oxalid   Bufferin Arthritis Strength Feldene Oxphenbutazone   Bufferin plain or Extra Strength Feldene Capsules Oxycodone with Aspirin   Bufferin with Codeine Fenoprofen Fenoprofen Pabalate or Pabalate-SF   Buffets II Flogesic Panagesic   Buffinol plain or Extra Strength Florinal or Florinal with Codeine Panwarfarin   Buf-Tabs Flurbiprofen Penicillamine   Butalbital Compound Four-way cold tablets Penicillin   Butazolidin Fragmin Pepto-Bismol   Carbenicillin Geminisyn Percodan   Carna Arthritis Reliever Geopen Persantine   Carprofen Gold's salt Persistin   Chloramphenicol Goody's Phenylbutazone   Chloromycetin Haltrain Piroxlcam   Clmetidine heparin Plaquenil   Cllnoril Hyco-pap Ponstel   Clofibrate Hydroxy chloroquine Propoxyphen         Before stopping any of these medications, be sure to consult the physician who ordered them.  Some, such as Coumadin (Warfarin) are ordered to prevent or treat serious conditions such as "deep thrombosis", "pumonary embolisms", and other heart problems.  The amount of time that you may need off of the medication may also vary with the medication and the reason for which you were taking it.  If you are taking any of these medications, please make sure you notify your pain physician before you undergo any  procedures.         Facet Blocks Patient Information  Description: The facets are joints in the spine between the vertebrae.  Like any joints in the body, facets can become irritated and painful.  Arthritis can also effect the facets.  By injecting steroids and local anesthetic in and around these joints, we can temporarily block the nerve supply to them.  Steroids act directly on irritated nerves and tissues to reduce selling and inflammation which often leads to decreased pain.  Facet blocks may be done anywhere along the spine from the neck to the low back depending upon the location of your pain.   After numbing the skin with local anesthetic (like Novocaine), a small needle is passed onto the facet joints under x-ray guidance.  You may experience a sensation of pressure while this is being done.  The entire block usually lasts about 15-25 minutes.   Conditions which may be treated by facet blocks:   Low back/buttock pain  Neck/shoulder pain  Certain types of headaches  Preparation for the injection:  1. Do not eat any solid food or dairy products within 8 hours of your appointment. 2. You may drink clear liquid up to 3 hours before appointment.  Clear liquids include water, black coffee, juice or soda.  No milk or cream please. 3. You may take your regular medication, including pain medications, with a sip of water before your appointment.  Diabetics should hold regular insulin (if taken separately) and take 1/2 normal NPH dose the morning of the procedure.  Carry some sugar containing items with you to your appointment. 4. A driver must accompany you and be prepared to drive you home after your procedure. 5. Bring all your current medications with you. 6. An IV may be inserted and sedation may be given at the discretion of the physician. 7. A blood pressure cuff, EKG and other monitors will often be applied during the procedure.  Some patients may need to have extra oxygen  administered for a short period. 8. You will be asked to provide medical information, including your allergies and medications, prior to the procedure.  We must know immediately if you are taking blood thinners (like Coumadin/Warfarin) or if you are allergic to IV iodine contrast (dye).  We must know if you could possible be pregnant.  Possible side-effects:   Bleeding from needle site  Infection (rare, may require surgery)  Nerve injury (rare)  Numbness & tingling (temporary)  Difficulty urinating (rare, temporary)  Spinal headache (a headache worse with upright posture)  Light-headedness (temporary)  Pain at injection site (serveral days)  Decreased blood pressure (rare, temporary)  Weakness in arm/leg (temporary)  Pressure sensation in back/neck (temporary)   Call if you experience:   Fever/chills associated with headache or increased back/neck pain  Headache worsened by an upright position  New onset, weakness or numbness of an extremity below the injection site  Hives or difficulty breathing (go to the emergency room)  Inflammation or drainage at the injection site(s)  Severe back/neck pain greater than usual  New symptoms which are concerning to you  Please note:  Although the local anesthetic injected can often make your back or neck feel good for several hours after the injection, the pain will likely return. It takes 3-7 days for steroids to work.  You may not notice any pain relief for at least one week.  If effective, we will often do a series of 2-3 injections spaced 3-6 weeks apart to maximally decrease your pain.  After the initial series, you may be a candidate for a more permanent nerve block of the facets.  If you have any questions, please call #[phone]) [facility]____________________________________________________________________________________________  Genicular Nerve Block  What is a genicular nerve  block? A genicular nerve block is the injection of a local anesthetic to block the nerves that transmits pain from the knee.  What is the purpose of a facet nerve block? A genicular nerve block is a diagnostic procedure to determine if the pathologic changes (i.e. arthritis, meniscal tears, etc) and inflammation within the knee joint is the source of your knee pain. It also confirms that the knee pain will respond well to the actual treatment procedure. If a genicular nerve  block works, it will give you relief for several hours. After that, the pain is expected to return to normal. This test is always performed twice (usually a week or two apart) because two successful tests are required to move onto treatment. If both diagnostic tests are positive, then we schedule a treatment called radiofrequency (RF) ablation. In this procedure, the same nerves are cauterized, which typically leads to pain relief for 4 -18 months. If this process works well for one knee, it can be performed on the other knee if needed.  How is the procedure performed? You will be placed on the procedure table. The injection site is sterilized with either iodine or chlorhexadine. The site to be injected is numbed with a local anesthetic, and a needle is directed to the target area. X-ray guidance is used to ensure proper placement and positioning of the needle. When the needle is properly positioned near the genicular nerve, local anesthetic is injected to numb that nerve. This will be repeated at multiple sites around the knee to block all genicular nerves.  Will the procedure be painful? The injection can be painful and we therefore provide the option of receiving IV sedation. IV sedation, combined with local anesthetic, can make the injection nearly pain free. It allows you to remain very still during the procedure, which can also make the injection easier, faster, and more successful. If you decide to have IV sedation, you must have  a driver to get you home safely afterwards. In addition, you cannot have anything to eat or drink within 8 hours of your appointment (clear liquids are allowed until 3 hours before the procedure). If you take medications for diabetes, these medications may need to be adjusted the morning of the procedure. Your primary care physician can help you with this adjustment.  What are the discharge instructions? If you received IV sedation do not drive or operate machinery for at least 24 hours after the procedure. You may return to work the next day following your procedure. You may resume your normal diet immediately. Do not engage in any strenuous activity for 24 hours. You should, however, engage in moderate activity that typically causes your ususal pain. If the block works, those activities should not be painful for several hours after the injection. Do not take a bath, swim, or use a hot tub for 24 hours (you may take a shower). Call the office if you have any of the following: severe pain afterwards (different than your usual symptoms), redness/swelling/discharge at the injection site(s), fevers/chills, difficulty with bowel or bladder functions.  What are the risks and side effects? The complication rate for this procedure is very low. Whenever a needle enters the skin, bleeding or infection can occur. Some other serious but extremely rare risks include paralysis and death. You may have an allergic reaction to any of the medications used. If you have a known allergy to any medications, especially local anesthetics, notify our staff before the procedure takes place. You may experience any of the following side effects up to 4 - 6 hours after the procedure:  Leg muscle weakness or numbness may occur due to the local anesthetic affecting the nerves that control your legs (this is a temporary affect and it is not paralysis). If you have any leg weakness or numbness, walk only with assistance in order to  prevent falls and injury. Your leg strength will return slowly and completely.  Dizziness may occur due to a decrease in your blood  pressure. If this occurs, remain in a seated or lying position. Gradually sit up, and then stand after at least 10 minutes of sitting.  Mild headaches may occur. Drink fluids and take pain medications if needed. If the headaches persist or become severe, call the office.  Mild discomfort at the injection site can occur. This typically lasts for a few hours but can persist for a couple days. If this occurs, take anti-inflammatories or pain medications, apply ice to the area the day of the procedure. If it persists, apply moist heat in the day(s) following.  The side effects listed above can be normal. They are not dangerous and will resolve on their own. If, however, you experience any of the following, a complication may have occurred and you should either contact your doctor. If he is not readily available, then you should proceed to the closest urgent care center for evaluation:  Severe or progressive pain at the injection site(s)  Arm or leg weakness that progressively worsens or persists for longer than 8 hours  Severe or progressive redness, swelling, or discharge from the injections site(s)  Fevers, chills, nausea, or vomiting  Bowel or bladder dysfunction (i.e. inability to urinate or pass stool or difficulty controlling either)  How long does it take for the procedure to work? You should feel relief from your usual pain within the first hour. Again, this is only expected to last for several hours, at the most. Remember, you may be sore in the middle part of your back from the needles, and you must distinguish this from your usual pain. ____________________________________________________________________________________________

## 2017-10-06 NOTE — Progress Notes (Signed)
Safety precautions to be maintained throughout the outpatient stay will include: orient to surroundings, keep bed in low position, maintain call bell within reach at all times, provide assistance with transfer out of bed and ambulation.  

## 2017-10-06 NOTE — Progress Notes (Signed)
Patient's Name: Stacy Martinez  MRN: [id number]  Referring Provider: Mikey College, *  DOB: 09/27/1955  PCP: Mikey College, NP  DOS: 10/06/2017  Note by: Gillis Santa, MD  Service setting: Ambulatory outpatient  Specialty: Interventional Pain Management  Location: [facility] (AMB) Pain Management Facility    Patient type: Established   Primary Reason(s) for Visit: Encounter for post-procedure evaluation of chronic illness with mild to moderate exacerbation CC: Back Pain (lower)  HPI  Stacy Martinez is a 62 y.o. year old, female patient, who comes today for a post-procedure evaluation. She has Bowel obstruction ([place]); BP (high blood pressure); DM type 2 with diabetic peripheral neuropathy ([place]); Wound of skin; Hyperlipidemia associated with type 2 diabetes mellitus ([place]); DDD (degenerative disc disease), lumbar; Cardiomyopathy ([place]); Arthritis; Facet syndrome, lumbar; Diabetic neuropathy ([facility]); DJD (degenerative joint disease) of knee; and Sacroiliac joint dysfunction on their problem list. Her primarily concern today is the Back Pain (lower)  Pain Assessment: Location: Lower Back Radiating: sometimes moves down the back of both legs and around to knees Onset: More than a month ago Duration: Chronic pain Quality: Constant, Throbbing, Dull, Shooting Severity: 5 /10 (self-reported pain score)  Note: Reported level is compatible with observation.                         When using our objective Pain Scale, levels between 6 and 10/10 are said to belong in an emergency room, as it progressively worsens from a 6/10, described as severely limiting, requiring emergency care not usually available at an outpatient pain management facility. At a 6/10 level, communication becomes difficult and requires great effort. Assistance to reach the emergency department may be required. Facial flushing and profuse sweating along with potentially dangerous increases in heart rate and blood pressure will be evident. Effect  on ADL: sometimes shootings pains won't allow pt to stand Timing: Constant Modifying factors: sitting, reclining, medications, procedures  [name] comes in today for post-procedure evaluation after the treatment done on 09/07/2017.  Further details on both, my assessment(s), as well as the proposed treatment plan, please see below.  Post-Procedure Assessment  09/07/2017 Procedure:  Therapeutic Medial Branch Facet Block #3 with steroid Level:  L3, L4, L5,  Medial Branch Level(s) Laterality: Bilateral Pre-procedure pain score:  5/10 Post-procedure pain score: 0/10         Influential Factors: BMI: 43.90 kg/m Intra-procedural challenges: None observed.         Assessment challenges: None detected.              Reported side-effects: None.        Post-procedural adverse reactions or complications: None reported         Sedation: Please see nurses note. When no sedatives are used, the analgesic levels obtained are directly associated to the effectiveness of the local anesthetics. However, when sedation is provided, the level of analgesia obtained during the initial 1 hour following the intervention, is believed to be the result of a combination of factors. These factors may include, but are not limited to: 1. The effectiveness of the local anesthetics used. 2. The effects of the analgesic(s) and/or anxiolytic(s) used. 3. The degree of discomfort experienced by the patient at the time of the procedure. 4. The patients ability and reliability in recalling and recording the events. 5. The presence and influence of possible secondary gains and/or psychosocial factors. Reported result: Relief experienced during the 1st hour after the procedure: 100 % (  Ultra-Short Term Relief)            Interpretative annotation: Clinically appropriate result. Analgesia during this period is likely to be Local Anesthetic and/or IV Sedative (Analgesic/Anxiolytic) related.          Effects of local anesthetic: The  analgesic effects attained during this period are directly associated to the localized infiltration of local anesthetics and therefore cary significant diagnostic value as to the etiological location, or anatomical origin, of the pain. Expected duration of relief is directly dependent on the pharmacodynamics of the local anesthetic used. Long-acting (4-6 hours) anesthetics used.  Reported result: Relief during the next 4 to 6 hour after the procedure: 100 % (Short-Term Relief)            Interpretative annotation: Clinically appropriate result. Analgesia during this period is likely to be Local Anesthetic-related.          Long-term benefit: Defined as the period of time past the expected duration of local anesthetics (1 hour for short-acting and 4-6 hours for long-acting). With the possible exception of prolonged sympathetic blockade from the local anesthetics, benefits during this period are typically attributed to, or associated with, other factors such as analgesic sensory neuropraxia, antiinflammatory effects, or beneficial biochemical changes provided by agents other than the local anesthetics.  Reported result: Extended relief following procedure: 50 %(100% improvement lasted for 2 weeks) (Long-Term Relief)            Interpretative annotation: Clinically appropriate result. Good relief. No permanent benefit expected. Inflammation plays a part in the etiology to the pain.          Current benefits: Defined as reported results that persistent at this point in time.   Analgesia: 25-50 %            Function: Somewhat improved ROM: Somewhat improved Interpretative annotation: Recurrence of symptoms. No permanent benefit expected. Effective diagnostic intervention.          Interpretation: Results would suggest a successful diagnostic intervention.                  Plan:  Proceed with treatment No.: 4  Laboratory Chemistry  Inflammation Markers (CRP: Acute Phase) (ESR: Chronic Phase) Lab  Results  Component Value Date   LATICACIDVEN 1.72 01/02/2014                 Rheumatology Markers No results found for: Elayne Guerin, [facility]              Renal Function Markers Lab Results  Component Value Date   BUN 19 04/08/2017   CREATININE 0.94 04/08/2017   GFRAA >89 06/19/2016   GFRNONAA 86 06/19/2016                 Hepatic Function Markers Lab Results  Component Value Date   AST 13 04/08/2017   ALT 13 04/08/2017   ALBUMIN 4.1 04/08/2017   ALKPHOS 119 04/08/2017   LIPASE 19 12/18/2013                 Electrolytes Lab Results  Component Value Date   NA 140 04/08/2017   K 4.2 04/08/2017   CL 103 04/08/2017   CALCIUM 9.3 04/08/2017                 Neuropathy Markers Lab Results  Component Value Date   HGBA1C 7.9 (H) 04/08/2017  Bone Pathology Markers No results found for: VD25OH, [id number], [id number], VF6433IR5, 25OHVITD1, 25OHVITD2, 25OHVITD3, TESTOFREE, TESTOSTERONE               Coagulation Parameters Lab Results  Component Value Date   PLT 533 (H) 06/19/2016                 Cardiovascular Markers Lab Results  Component Value Date   TROPONINI <0.03 03/17/2016   HGB 13.0 06/19/2016   HCT 40.0 06/19/2016                 CA Markers No results found for: CEA, CA125, LABCA2               Note: Lab results reviewed.  Recent Diagnostic Imaging Results  DG C-Arm 1-60 Min-No Report Fluoroscopy was utilized by the requesting physician.  No radiographic  interpretation.   Complexity Note: Imaging results reviewed. Results shared with Ms. Daigneault, using Layman's terms.                         Meds   Current Outpatient Medications:  .  amLODipine (NORVASC) 10 MG tablet, TAKE 1 TABLET BY MOUTH ONCE DAILY, Disp: 30 tablet, Rfl: 0 .  carvedilol (COREG) 25 MG tablet, Take 1 tablet (25 mg total) by mouth 2 (two) times daily with a meal., Disp: 60 tablet, Rfl: 0 .  empagliflozin (JARDIANCE) 25 MG TABS tablet,  Take 25 mg by mouth daily., Disp: , Rfl:  .  enalapril (VASOTEC) 20 MG tablet, Take 1 tablet (20 mg total) by mouth 2 (two) times daily., Disp: 60 tablet, Rfl: 11 .  furosemide (LASIX) 40 MG tablet, Take 2 tablets (80 mg total) by mouth daily. (Patient taking differently: Take 40 mg by mouth daily. ), Disp: 60 tablet, Rfl: 0 .  glucose blood (RELION GLUCOSE TEST STRIPS) test strip, Use as instructed, Disp: 100 each, Rfl: 12 .  ReliOn Ultra Thin Lancets MISC, 1 each by Does not apply route daily., Disp: 100 each, Rfl: 11 .  rosuvastatin (CRESTOR) 5 MG tablet, Take 1 tablet (5 mg total) by mouth daily., Disp: 90 tablet, Rfl: 3 .  Semaglutide (OZEMPIC) 0.25 or 0.5 MG/DOSE SOPN, Inject 0.25 mg into the skin once a week. For 4 weeks, then inject 0.5 mg into the skin once a week and continue at this dose., Disp: 1 pen, Rfl: 2 .  tiZANidine (ZANAFLEX) 4 MG capsule, Take 1 capsule (4 mg total) by mouth 2 (two) times daily as needed for muscle spasms., Disp: 60 capsule, Rfl: 0 .  traMADol (ULTRAM) 50 MG tablet, Limit one half to one tab by mouth per day or every other day if tolerated (Patient not taking: Reported on 09/07/2017), Disp: 30 tablet, Rfl: 0  Current Facility-Administered Medications:  .  bupivacaine (PF) (MARCAINE) 0.25 % injection 30 mL, 30 mL, Other, Once, Mohammed Kindle, MD .  ceFAZolin (ANCEF) IVPB 1 g/50 mL premix, 1 g, Intravenous, Once, Mohammed Kindle, MD .  fentaNYL (SUBLIMAZE) injection 100 mcg, 100 mcg, Intravenous, Once, Mohammed Kindle, MD .  lactated ringers infusion 1,000 mL, 1,000 mL, Intravenous, Continuous, Mohammed Kindle, MD, Last Rate: 125 mL/hr at 01/30/16 0933, 1,000 mL at 01/30/16 0933 .  lactated ringers infusion 1,000 mL, 1,000 mL, Intravenous, Continuous, Mohammed Kindle, MD, Last Rate: 125 mL/hr at 03/10/16 1100, 1,000 mL at 03/10/16 1100 .  lactated ringers infusion 1,000 mL, 1,000 mL, Intravenous, Continuous, Mohammed Kindle, MD .  lidocaine (PF) (  XYLOCAINE) 1 % injection  10 mL, 10 mL, Subcutaneous, Once, Mohammed Kindle, MD .  midazolam (VERSED) 5 MG/5ML injection 5 mg, 5 mg, Intravenous, Once, Mohammed Kindle, MD .  orphenadrine (NORFLEX) injection 60 mg, 60 mg, Intramuscular, Once, Mohammed Kindle, MD .  sodium chloride flush (NS) 0.9 % injection 20 mL, 20 mL, Other, Once, Mohammed Kindle, MD .  triamcinolone acetonide (KENALOG-40) injection 40 mg, 40 mg, Other, Once, Mohammed Kindle, MD  ROS  Constitutional: Denies any fever or chills Gastrointestinal: No reported hemesis, hematochezia, vomiting, or acute GI distress Musculoskeletal: Denies any acute onset joint swelling, redness, loss of ROM, or weakness Neurological: No reported episodes of acute onset apraxia, aphasia, dysarthria, agnosia, amnesia, paralysis, loss of coordination, or loss of consciousness  Allergies  Ms. Weinand is allergic to prednisone.  PFSH  Drug: Ms. Carreras  reports that she does not use drugs. Alcohol:  reports that she does not drink alcohol. Tobacco:  reports that  has never smoked. she has never used smokeless tobacco. Medical:  has a past medical history of Allergy, Arthritis, Diabetes mellitus without complication ([place]), Enlarged heart, and Hypertension. Surgical: Ms. Fruge  has a past surgical history that includes Colon surgery; Hernia repair; and Wrist fracture surgery (Right). Family: family history includes Cancer in her father; Diabetes in her mother; Mental illness in her son.  Constitutional Exam  General appearance: Well nourished, well developed, and well hydrated. In no apparent acute distress Vitals:   10/06/17 0843  BP: (!) 150/74  Pulse: 79  Resp: 16  Temp: 97.7 F (36.5 C)  TempSrc: Oral  SpO2: 98%  Weight: 240 lb (108.9 kg)  Height: '5\' 2"'$  (1.575 m)   BMI Assessment: Estimated body mass index is 43.9 kg/m as calculated from the following:   Height as of this encounter: '5\' 2"'$  (1.575 m).   Weight as of this encounter: 240 lb (108.9 kg).  BMI interpretation  table: BMI level Category Range association with higher incidence of chronic pain  <18 kg/m2 Underweight   18.5-24.9 kg/m2 Ideal body weight   25-29.9 kg/m2 Overweight Increased incidence by 20%  30-34.9 kg/m2 Obese (Class I) Increased incidence by 68%  35-39.9 kg/m2 Severe obesity (Class II) Increased incidence by 136%  >40 kg/m2 Extreme obesity (Class III) Increased incidence by 254%   BMI Readings from Last 4 Encounters:  10/06/17 43.90 kg/m  09/07/17 43.90 kg/m  07/09/17 43.90 kg/m  06/24/17 43.90 kg/m   Wt Readings from Last 4 Encounters:  10/06/17 240 lb (108.9 kg)  09/07/17 240 lb (108.9 kg)  07/09/17 240 lb (108.9 kg)  06/24/17 240 lb (108.9 kg)  Psych/Mental status: Alert, oriented x 3 (person, place, & time)       Eyes: PERLA Respiratory: No evidence of acute respiratory distress  Cervical Spine Area Exam  Skin & Axial Inspection: No masses, redness, edema, swelling, or associated skin lesions Alignment: Symmetrical Functional ROM: Unrestricted ROM      Stability: No instability detected Muscle Tone/Strength: Functionally intact. No obvious neuro-muscular anomalies detected. Sensory (Neurological): Unimpaired Palpation: No palpable anomalies              Upper Extremity (UE) Exam    Side: Right upper extremity  Side: Left upper extremity  Skin & Extremity Inspection: Skin color, temperature, and hair growth are WNL. No peripheral edema or cyanosis. No masses, redness, swelling, asymmetry, or associated skin lesions. No contractures.  Skin & Extremity Inspection: Skin color, temperature, and hair growth are WNL. No peripheral edema  or cyanosis. No masses, redness, swelling, asymmetry, or associated skin lesions. No contractures.  Functional ROM: Unrestricted ROM          Functional ROM: Unrestricted ROM          Muscle Tone/Strength: Functionally intact. No obvious neuro-muscular anomalies detected.  Muscle Tone/Strength: Functionally intact. No obvious  neuro-muscular anomalies detected.  Sensory (Neurological): Unimpaired          Sensory (Neurological): Unimpaired          Palpation: No palpable anomalies              Palpation: No palpable anomalies              Specialized Test(s): Deferred         Specialized Test(s): Deferred          Thoracic Spine Area Exam  Skin & Axial Inspection: No masses, redness, or swelling Alignment: Symmetrical Functional ROM: Unrestricted ROM Stability: No instability detected Muscle Tone/Strength: Functionally intact. No obvious neuro-muscular anomalies detected. Sensory (Neurological): Unimpaired Muscle strength & Tone: No palpable anomalies  Lumbar Spine Area Exam  Skin & Axial Inspection: No masses, redness, or swelling Alignment: Symmetrical Functional ROM: Decreased ROM, bilaterally Stability: No instability detected Muscle Tone/Strength: Functionally intact. No obvious neuro-muscular anomalies detected. Sensory (Neurological): Articular pain pattern Palpation: Complains of area being tender to palpation Bilateral Fist Percussion Test Provocative Tests: Lumbar Hyperextension and rotation test: Positive bilaterally for facet joint pain. Lumbar Lateral bending test: Positive due to pain. Patrick's Maneuver: evaluation deferred today                    Gait & Posture Assessment  Ambulation: Unassisted Gait: Relatively normal for age and body habitus Posture: WNL   Lower Extremity Exam    Side: Right lower extremity  Side: Left lower extremity  Skin & Extremity Inspection: Skin color, temperature, and hair growth are WNL. No peripheral edema or cyanosis. No masses, redness, swelling, asymmetry, or associated skin lesions. No contractures.  Skin & Extremity Inspection: Skin color, temperature, and hair growth are WNL. No peripheral edema or cyanosis. No masses, redness, swelling, asymmetry, or associated skin lesions. No contractures.  Functional ROM: Unrestricted ROM          Functional ROM:  Unrestricted ROM          Muscle Tone/Strength: Functionally intact. No obvious neuro-muscular anomalies detected.  Muscle Tone/Strength: Functionally intact. No obvious neuro-muscular anomalies detected.  Sensory (Neurological): Unimpaired  Sensory (Neurological): Unimpaired  Palpation: No palpable anomalies  Palpation: No palpable anomalies   Assessment  Primary Diagnosis & Pertinent Problem List: The primary encounter diagnosis was Spondylosis without myelopathy or radiculopathy, lumbar region. Diagnoses of Facet syndrome, lumbar, Lumbar spondylosis, and DDD (degenerative disc disease), lumbar were also pertinent to this visit.  Status Diagnosis  Recurring Persistent Persistent 1. Spondylosis without myelopathy or radiculopathy, lumbar region   2. Facet syndrome, lumbar   3. Lumbar spondylosis   4. DDD (degenerative disc disease), lumbar     General Recommendations: The pain condition that the patient suffers from is best treated with a multidisciplinary approach that involves an increase in physical activity to prevent de-conditioning and worsening of the pain cycle, as well as psychological counseling (formal and/or informal) to address the co-morbid psychological affects of pain. Treatment will often involve judicious use of pain medications and interventional procedures to decrease the pain, allowing the patient to participate in the physical activity that will ultimately produce long-lasting pain reductions. The  goal of the multidisciplinary approach is to return the patient to a higher level of overall function and to restore their ability to perform activities of daily living.  62 year old female with a past medical history of cardiomyopathy, hypertension, hyperlipidemia with a history of chronic low back pain secondary to lumbar spondylosis without myelopathy, lumbar spinal stenosis, degenerative joint disease of bilateral knees and SI joint arthralgia. Patient is status post bilateral  lumbar facet blocks on September 07, 2017.  She notes significant improvement in her axial low back pain for approximately 2 weeks.  She notes greater than 75% improvement in those 2 weeks.  She was able to perform activities of daily living, stand up erect, walk for greater period of time without having as much pain.  She notes return of axial low back pain symptoms similar in quality to pre-block levels although not the same severity yet.  She still has not completed physical therapy yet and we are waiting for her to do that so that we can proceed with radiofrequency ablation to provide longer duration of benefit.  Patient states that her work does not allow her to participate in physical therapy.  She is also having to take care of her daughter who is having neurological issues.  At this point we will plan on repeat lumbar facet medial branch nerve block at L3/L4, L4/L5, L5/S1 bilaterally.  Plan: -Repeat lumbar facet medial branch nerve block at L3, L4, L5 bilaterally.  This will be therapeutic block #4. -Future considerations include radiofrequency ablation after patient has completed physical therapy.  Plan of Care  Pharmacotherapy (Medications Ordered): No orders of the defined types were placed in this encounter.  Lab-work, procedure(s), and/or referral(s): Orders Placed This Encounter  Procedures  . LUMBAR FACET(MEDIAL BRANCH NERVE BLOCK) MBNB    Provider-requested follow-up: Return in about 2 weeks (around 10/20/2017) for Procedure. Time Note: Greater than 50% of the 25 minute(s) of face-to-face time spent with Ms. Dore, was spent in counseling/coordination of care regarding: Ms. Glick's primary cause of pain, the treatment plan, treatment alternatives, the results, interpretation and significance of  her recent diagnostic interventional treatment(s), realistic expectations and the need to bring and keep the BMI below 30. Future Appointments  Date Time Provider [place]  10/21/2017   9:00 AM Gillis Santa, MD [facility] None    Primary Care Physician: Mikey College, NP Location: [facility] Out[facility] Note by: Gillis Santa, M.D Date: 10/06/2017; Time: 9:41 AM  Patient Instructions   GENERAL RISKS AND COMPLICATIONS  What are the risk, side effects and possible complications? Generally speaking, most procedures are safe.  However, with any procedure there are risks, side effects, and the possibility of complications.  The risks and complications are dependent upon the sites that are lesioned, or the type of nerve block to be performed.  The closer the procedure is to the spine, the more serious the risks are.  Great care is taken when placing the radio frequency needles, block needles or lesioning probes, but sometimes complications can occur. 1. Infection: Any time there is an injection through the skin, there is a risk of infection.  This is why sterile conditions are used for these blocks.  There are four possible types of infection. 1. Localized skin infection. 2. Central Nervous System Infection-This can be in the form of Meningitis, which can be deadly. 3. Epidural Infections-This can be in the form of an epidural abscess, which can cause pressure inside of the spine, causing compression of  the spinal cord with subsequent paralysis. This would require an emergency surgery to decompress, and there are no guarantees that the patient would recover from the paralysis. 4. Discitis-This is an infection of the intervertebral discs.  It occurs in about 1% of discography procedures.  It is difficult to treat and it may lead to surgery.        2. Pain: the needles have to go through skin and soft tissues, will cause soreness.       3. Damage to internal structures:  The nerves to be lesioned may be near blood vessels or    other nerves which can be potentially damaged.       4. Bleeding: Bleeding is more common if the patient is taking blood thinners such  as  aspirin, Coumadin, Ticiid, Plavix, etc., or if he/she have some genetic predisposition  such as hemophilia. Bleeding into the spinal canal can cause compression of the spinal  cord with subsequent paralysis.  This would require an emergency surgery to  decompress and there are no guarantees that the patient would recover from the  paralysis.       5. Pneumothorax:  Puncturing of a lung is a possibility, every time a needle is introduced in  the area of the chest or upper back.  Pneumothorax refers to free air around the  collapsed lung(s), inside of the thoracic cavity (chest cavity).  Another two possible  complications related to a similar event would include: Hemothorax and Chylothorax.   These are variations of the Pneumothorax, where instead of air around the collapsed  lung(s), you may have blood or chyle, respectively.       6. Spinal headaches: They may occur with any procedures in the area of the spine.       7. Persistent CSF (Cerebro-Spinal Fluid) leakage: This is a rare problem, but may occur  with prolonged intrathecal or epidural catheters either due to the formation of a fistulous  track or a dural tear.       8. Nerve damage: By working so close to the spinal cord, there is always a possibility of  nerve damage, which could be as serious as a permanent spinal cord injury with  paralysis.       9. Death:  Although rare, severe deadly allergic reactions known as "Anaphylactic  reaction" can occur to any of the medications used.      10. Worsening of the symptoms:  We can always make thing worse.  What are the chances of something like this happening? Chances of any of this occuring are extremely low.  By statistics, you have more of a chance of getting killed in a motor vehicle accident: while driving to the hospital than any of the above occurring .  Nevertheless, you should be aware that they are possibilities.  In general, it is similar to taking a shower.  Everybody knows that you can  slip, hit your head and get killed.  Does that mean that you should not shower again?  Nevertheless always keep in mind that statistics do not mean anything if you happen to be on the wrong side of them.  Even if a procedure has a 1 (one) in a 1,000,000 (million) chance of going wrong, it you happen to be that one..Also, keep in mind that by statistics, you have more of a chance of having something go wrong when taking medications.  Who should not have this procedure? If you are on a blood  thinning medication (e.g. Coumadin, Plavix, see list of "Blood Thinners"), or if you have an active infection going on, you should not have the procedure.  If you are taking any blood thinners, please inform your physician.  How should I prepare for this procedure?  Do not eat or drink anything at least six hours prior to the procedure.  Bring a driver with you .  It cannot be a taxi.  Come accompanied by an adult that can drive you back, and that is strong enough to help you if your legs get weak or numb from the local anesthetic.  Take all of your medicines the morning of the procedure with just enough water to swallow them.  If you have diabetes, make sure that you are scheduled to have your procedure done first thing in the morning, whenever possible.  If you have diabetes, take only half of your insulin dose and notify our nurse that you have done so as soon as you arrive at the clinic.  If you are diabetic, but only take blood sugar pills (oral hypoglycemic), then do not take them on the morning of your procedure.  You may take them after you have had the procedure.  Do not take aspirin or any aspirin-containing medications, at least eleven (11) days prior to the procedure.  They may prolong bleeding.  Wear loose fitting clothing that may be easy to take off and that you would not mind if it got stained with Betadine or blood.  Do not wear any jewelry or perfume  Remove any nail coloring.  It will  interfere with some of our monitoring equipment.  NOTE: Remember that this is not meant to be interpreted as a complete list of all possible complications.  Unforeseen problems may occur.  BLOOD THINNERS The following drugs contain aspirin or other products, which can cause increased bleeding during surgery and should not be taken for 2 weeks prior to and 1 week after surgery.  If you should need take something for relief of minor pain, you may take acetaminophen which is found in Tylenol,m Datril, Anacin-3 and Panadol. It is not blood thinner. The products listed below are.  Do not take any of the products listed below in addition to any listed on your instruction sheet.  A.P.C or A.P.C with Codeine Codeine Phosphate Capsules #3 Ibuprofen Ridaura  ABC compound Congesprin Imuran rimadil  Advil Cope Indocin Robaxisal  Alka-Seltzer Effervescent Pain Reliever and Antacid Coricidin or Coricidin-D  Indomethacin Rufen  Alka-Seltzer plus Cold Medicine Cosprin Ketoprofen S-A-C Tablets  Anacin Analgesic Tablets or Capsules Coumadin Korlgesic Salflex  Anacin Extra Strength Analgesic tablets or capsules CP-2 Tablets Lanoril Salicylate  Anaprox Cuprimine Capsules Levenox Salocol  Anexsia-D Dalteparin Magan Salsalate  Anodynos Darvon compound Magnesium Salicylate Sine-off  Ansaid Dasin Capsules Magsal Sodium Salicylate  Anturane Depen Capsules Marnal Soma  APF Arthritis pain formula Dewitt's Pills Measurin Stanback  Argesic Dia-Gesic Meclofenamic Sulfinpyrazone  Arthritis Bayer Timed Release Aspirin Diclofenac Meclomen Sulindac  Arthritis pain formula Anacin Dicumarol Medipren Supac  Analgesic (Safety coated) Arthralgen Diffunasal Mefanamic Suprofen  Arthritis Strength Bufferin Dihydrocodeine Mepro Compound Suprol  Arthropan liquid Dopirydamole Methcarbomol with Aspirin Synalgos  ASA tablets/Enseals Disalcid Micrainin Tagament  Ascriptin Doan's Midol Talwin  Ascriptin A/D Dolene Mobidin Tanderil   Ascriptin Extra Strength Dolobid Moblgesic Ticlid  Ascriptin with Codeine Doloprin or Doloprin with Codeine Momentum Tolectin  Asperbuf Duoprin Mono-gesic Trendar  Aspergum Duradyne Motrin or Motrin IB Triminicin  Aspirin plain, buffered or enteric coated  Durasal Myochrisine Trigesic  Aspirin Suppositories Easprin Nalfon Trillsate  Aspirin with Codeine Ecotrin Regular or Extra Strength Naprosyn Uracel  Atromid-S Efficin Naproxen Ursinus  Auranofin Capsules Elmiron Neocylate Vanquish  Axotal Emagrin Norgesic Verin  Azathioprine Empirin or Empirin with Codeine Normiflo Vitamin E  Azolid Emprazil Nuprin Voltaren  Bayer Aspirin plain, buffered or children's or timed BC Tablets or powders Encaprin Orgaran Warfarin Sodium  Buff-a-Comp Enoxaparin Orudis Zorpin  Buff-a-Comp with Codeine Equegesic Os-Cal-Gesic   Buffaprin Excedrin plain, buffered or Extra Strength Oxalid   Bufferin Arthritis Strength Feldene Oxphenbutazone   Bufferin plain or Extra Strength Feldene Capsules Oxycodone with Aspirin   Bufferin with Codeine Fenoprofen Fenoprofen Pabalate or Pabalate-SF   Buffets II Flogesic Panagesic   Buffinol plain or Extra Strength Florinal or Florinal with Codeine Panwarfarin   Buf-Tabs Flurbiprofen Penicillamine   Butalbital Compound Four-way cold tablets Penicillin   Butazolidin Fragmin Pepto-Bismol   Carbenicillin Geminisyn Percodan   Carna Arthritis Reliever Geopen Persantine   Carprofen Gold's salt Persistin   Chloramphenicol Goody's Phenylbutazone   Chloromycetin Haltrain Piroxlcam   Clmetidine heparin Plaquenil   Cllnoril Hyco-pap Ponstel   Clofibrate Hydroxy chloroquine Propoxyphen         Before stopping any of these medications, be sure to consult the physician who ordered them.  Some, such as Coumadin (Warfarin) are ordered to prevent or treat serious conditions such as "deep thrombosis", "pumonary embolisms", and other heart problems.  The amount of time that you may need off  of the medication may also vary with the medication and the reason for which you were taking it.  If you are taking any of these medications, please make sure you notify your pain physician before you undergo any procedures.         Facet Blocks Patient Information  Description: The facets are joints in the spine between the vertebrae.  Like any joints in the body, facets can become irritated and painful.  Arthritis can also effect the facets.  By injecting steroids and local anesthetic in and around these joints, we can temporarily block the nerve supply to them.  Steroids act directly on irritated nerves and tissues to reduce selling and inflammation which often leads to decreased pain.  Facet blocks may be done anywhere along the spine from the neck to the low back depending upon the location of your pain.   After numbing the skin with local anesthetic (like Novocaine), a small needle is passed onto the facet joints under x-ray guidance.  You may experience a sensation of pressure while this is being done.  The entire block usually lasts about 15-25 minutes.   Conditions which may be treated by facet blocks:   Low back/buttock pain  Neck/shoulder pain  Certain types of headaches  Preparation for the injection:  1. Do not eat any solid food or dairy products within 8 hours of your appointment. 2. You may drink clear liquid up to 3 hours before appointment.  Clear liquids include water, black coffee, juice or soda.  No milk or cream please. 3. You may take your regular medication, including pain medications, with a sip of water before your appointment.  Diabetics should hold regular insulin (if taken separately) and take 1/2 normal NPH dose the morning of the procedure.  Carry some sugar containing items with you to your appointment. 4. A driver must accompany you and be prepared to drive you home after your procedure. 5. Bring all your current medications with you. 6. An IV  may be  inserted and sedation may be given at the discretion of the physician. 7. A blood pressure cuff, EKG and other monitors will often be applied during the procedure.  Some patients may need to have extra oxygen administered for a short period. 8. You will be asked to provide medical information, including your allergies and medications, prior to the procedure.  We must know immediately if you are taking blood thinners (like Coumadin/Warfarin) or if you are allergic to IV iodine contrast (dye).  We must know if you could possible be pregnant.  Possible side-effects:   Bleeding from needle site  Infection (rare, may require surgery)  Nerve injury (rare)  Numbness & tingling (temporary)  Difficulty urinating (rare, temporary)  Spinal headache (a headache worse with upright posture)  Light-headedness (temporary)  Pain at injection site (serveral days)  Decreased blood pressure (rare, temporary)  Weakness in arm/leg (temporary)  Pressure sensation in back/neck (temporary)   Call if you experience:   Fever/chills associated with headache or increased back/neck pain  Headache worsened by an upright position  New onset, weakness or numbness of an extremity below the injection site  Hives or difficulty breathing (go to the emergency room)  Inflammation or drainage at the injection site(s)  Severe back/neck pain greater than usual  New symptoms which are concerning to you  Please note:  Although the local anesthetic injected can often make your back or neck feel good for several hours after the injection, the pain will likely return. It takes 3-7 days for steroids to work.  You may not notice any pain relief for at least one week.  If effective, we will often do a series of 2-3 injections spaced 3-6 weeks apart to maximally decrease your pain.  After the initial series, you may be a candidate for a more permanent nerve block of the facets.  If you have any questions, please  call [phone]) [facility]____________________________________________________________________________________________  Genicular Nerve Block  What is a genicular nerve block? A genicular nerve block is the injection of a local anesthetic to block the nerves that transmits pain from the knee.  What is the purpose of a facet nerve block? A genicular nerve block is a diagnostic procedure to determine if the pathologic changes (i.e. arthritis, meniscal tears, etc) and inflammation within the knee joint is the source of your knee pain. It also confirms that the knee pain will respond well to the actual treatment procedure. If a genicular nerve block works, it will give you relief for several hours. After that, the pain is expected to return to normal. This test is always performed twice (usually a week or two apart) because two successful tests are required to move onto treatment. If both diagnostic tests are positive, then we schedule a treatment called radiofrequency (RF) ablation. In this procedure, the same nerves are cauterized, which typically leads to pain relief for 4 -18 months. If this process works well for one knee, it can be performed on the other knee if needed.  How is the procedure performed? You will be placed on the procedure table. The injection site is sterilized with either iodine or chlorhexadine. The site to be injected is numbed with a local anesthetic, and a needle is directed to the target area. X-ray guidance is used to ensure proper placement and positioning of the needle. When the needle is properly positioned near the genicular nerve, local anesthetic is injected to numb that nerve. This will  be repeated at multiple sites around the knee to block all genicular nerves.  Will the procedure be painful? The injection can be painful and we therefore provide the option of receiving IV sedation. IV sedation, combined with local anesthetic, can  make the injection nearly pain free. It allows you to remain very still during the procedure, which can also make the injection easier, faster, and more successful. If you decide to have IV sedation, you must have a driver to get you home safely afterwards. In addition, you cannot have anything to eat or drink within 8 hours of your appointment (clear liquids are allowed until 3 hours before the procedure). If you take medications for diabetes, these medications may need to be adjusted the morning of the procedure. Your primary care physician can help you with this adjustment.  What are the discharge instructions? If you received IV sedation do not drive or operate machinery for at least 24 hours after the procedure. You may return to work the next day following your procedure. You may resume your normal diet immediately. Do not engage in any strenuous activity for 24 hours. You should, however, engage in moderate activity that typically causes your ususal pain. If the block works, those activities should not be painful for several hours after the injection. Do not take a bath, swim, or use a hot tub for 24 hours (you may take a shower). Call the office if you have any of the following: severe pain afterwards (different than your usual symptoms), redness/swelling/discharge at the injection site(s), fevers/chills, difficulty with bowel or bladder functions.  What are the risks and side effects? The complication rate for this procedure is very low. Whenever a needle enters the skin, bleeding or infection can occur. Some other serious but extremely rare risks include paralysis and death. You may have an allergic reaction to any of the medications used. If you have a known allergy to any medications, especially local anesthetics, notify our staff before the procedure takes place. You may experience any of the following side effects up to 4 - 6 hours after the procedure: . Leg muscle weakness or numbness may  occur due to the local anesthetic affecting the nerves that control your legs (this is a temporary affect and it is not paralysis). If you have any leg weakness or numbness, walk only with assistance in order to prevent falls and injury. Your leg strength will return slowly and completely. . Dizziness may occur due to a decrease in your blood pressure. If this occurs, remain in a seated or lying position. Gradually sit up, and then stand after at least 10 minutes of sitting. . Mild headaches may occur. Drink fluids and take pain medications if needed. If the headaches persist or become severe, call the office. . Mild discomfort at the injection site can occur. This typically lasts for a few hours but can persist for a couple days. If this occurs, take anti-inflammatories or pain medications, apply ice to the area the day of the procedure. If it persists, apply moist heat in the day(s) following.  The side effects listed above can be normal. They are not dangerous and will resolve on their own. If, however, you experience any of the following, a complication may have occurred and you should either contact your doctor. If he is not readily available, then you should proceed to the closest urgent care center for evaluation: . Severe or progressive pain at the injection site(s) . Arm or leg weakness that  progressively worsens or persists for longer than 8 hours . Severe or progressive redness, swelling, or discharge from the injections site(s) . Fevers, chills, nausea, or vomiting . Bowel or bladder dysfunction (i.e. inability to urinate or pass stool or difficulty controlling either)  How long does it take for the procedure to work? You should feel relief from your usual pain within the first hour. Again, this is only expected to last for several hours, at the most. Remember, you may be sore in the middle part of your back from the needles, and you must distinguish this from your usual  pain. ____________________________________________________________________________________________

## 2017-10-18 ENCOUNTER — Other Ambulatory Visit: Payer: Self-pay | Admitting: Nurse Practitioner

## 2017-10-18 ENCOUNTER — Other Ambulatory Visit: Payer: Self-pay | Admitting: Family Medicine

## 2017-10-18 DIAGNOSIS — I10 Essential (primary) hypertension: Secondary | ICD-10-CM

## 2017-10-21 ENCOUNTER — Encounter: Payer: Self-pay | Admitting: Student in an Organized Health Care Education/Training Program

## 2017-10-21 ENCOUNTER — Ambulatory Visit
Admission: RE | Admit: 2017-10-21 | Discharge: 2017-10-21 | Disposition: A | Discharge status: Home / Self Care | Payer: BLUE CROSS/BLUE SHIELD | Source: Ambulatory Visit | Attending: Student in an Organized Health Care Education/Training Program | Admitting: Student in an Organized Health Care Education/Training Program

## 2017-10-21 ENCOUNTER — Other Ambulatory Visit: Payer: Self-pay

## 2017-10-21 ENCOUNTER — Ambulatory Visit (HOSPITAL_BASED_OUTPATIENT_CLINIC_OR_DEPARTMENT_OTHER): Payer: BLUE CROSS/BLUE SHIELD | Admitting: Student in an Organized Health Care Education/Training Program

## 2017-10-21 VITALS — BP 107/82 | HR 65 | Temp 98.0°F | Resp 20 | Ht 62.0 in | Wt 240.0 lb

## 2017-10-21 DIAGNOSIS — Z888 Allergy status to other drugs, medicaments and biological substances status: Secondary | ICD-10-CM | POA: Insufficient documentation

## 2017-10-21 DIAGNOSIS — M545 Low back pain: Secondary | ICD-10-CM | POA: Insufficient documentation

## 2017-10-21 DIAGNOSIS — Z9889 Other specified postprocedural states: Secondary | ICD-10-CM | POA: Insufficient documentation

## 2017-10-21 DIAGNOSIS — M5136 Other intervertebral disc degeneration, lumbar region: Secondary | ICD-10-CM | POA: Diagnosis not present

## 2017-10-21 DIAGNOSIS — M47816 Spondylosis without myelopathy or radiculopathy, lumbar region: Secondary | ICD-10-CM | POA: Insufficient documentation

## 2017-10-21 MED ORDER — FENTANYL CITRATE (PF) 100 MCG/2ML IJ SOLN
25.0000 ug | INTRAMUSCULAR | Status: DC | PRN
  Administered 2017-10-21: 50 ug via INTRAVENOUS
  Filled 2017-10-21: qty 2

## 2017-10-21 MED ORDER — ROPIVACAINE HCL 2 MG/ML IJ SOLN
10.0000 mL | Freq: Once | INTRAMUSCULAR | Status: AC
  Administered 2017-10-21: 10 mL
  Filled 2017-10-21: qty 10

## 2017-10-21 MED ORDER — LIDOCAINE HCL (PF) 1 % IJ SOLN
INTRAMUSCULAR | Status: AC
Start: 2017-10-21 — End: 2017-10-21
  Filled 2017-10-21: qty 5

## 2017-10-21 MED ORDER — LIDOCAINE HCL 1 % IJ SOLN
10.0000 mL | Freq: Once | INTRAMUSCULAR | Status: AC
Start: 2017-10-21 — End: 2017-10-21
  Administered 2017-10-21: 10 mL
  Filled 2017-10-21: qty 10

## 2017-10-21 MED ORDER — DEXAMETHASONE SODIUM PHOSPHATE 10 MG/ML IJ SOLN
10.0000 mg | Freq: Once | INTRAMUSCULAR | Status: AC
Start: 2017-10-21 — End: 2017-10-21
  Administered 2017-10-21: 10 mg
  Filled 2017-10-21: qty 1

## 2017-10-21 MED ORDER — LACTATED RINGERS IV SOLN
1000.0000 mL | Freq: Once | INTRAVENOUS | Status: AC
  Administered 2017-10-21: 1000 mL via INTRAVENOUS

## 2017-10-21 NOTE — Patient Instructions (Addendum)
____________________________________________________________________________________________  Post-procedure Information What to expect: Most procedures involve the use of a local anesthetic (numbing medicine), and a steroid (anti-inflammatory medicine).  The local anesthetics may cause temporary numbness and weakness of the legs or arms, depending on the location of the block. This numbness/weakness may last 4-6 hours, depending on the local anesthetic used. In rare instances, it can last up to 24 hours. While numb, you must be very careful not to injure the extremity.  After any procedure, you could expect the pain to get better within 15-20 minutes. This relief is temporary and may last 4-6 hours. Once the local anesthetics wears off, you could experience discomfort, possibly more than usual, for up to 10 (ten) days. In the case of radiofrequencies, it may last up to 6 weeks. Surgeries may take up to 8 weeks for the healing process. The discomfort is due to the irritation caused by needles going through skin and muscle. To minimize the discomfort, we recommend using ice the first day, and heat from then on. The ice should be applied for 15 minutes on, and 15 minutes off. Keep repeating this cycle until bedtime. Avoid applying the ice directly to the skin, to prevent frostbite. Heat should be used daily, until the pain improves (4-10 days). Be careful not to burn yourself.  Occasionally you may experience muscle spasms or cramps. These occur as a consequence of the irritation caused by the needle sticks to the muscle and the blood that will inevitably be lost into the surrounding muscle tissue. Blood tends to be very irritating to tissues, which tend to react by going into spasm. These spasms may start the same day of your procedure, but they may also take days to develop. This late onset type of spasm or cramp is usually caused by electrolyte imbalances triggered by the steroids, at the level of the  kidney. Cramps and spasms tend to respond well to muscle relaxants, multivitamins (some are triggered by the procedure, but may have their origins in vitamin deficiencies), and Gatorade, or any sports drinks that can replenish any electrolyte imbalances. (If you are a diabetic, ask your pharmacist to get you a sugar-free brand.) Warm showers or baths may also be helpful. Stretching exercises are highly recommended. General Instructions:  Be alert for signs of possible infection: redness, swelling, heat, red streaks, elevated temperature, and/or fever. These typically appear 4 to 6 days after the procedure. Immediately notify your doctor if you experience unusual bleeding, difficulty breathing, or loss of bowel or bladder control. If you experience increased pain, do not increase your pain medicine intake, unless instructed by your pain physician. Post-Procedure Care:  Be careful in moving about. Muscle spasms in the area of the injection may occur. Applying ice or heat to the area is often helpful. The incidence of spinal headaches after epidural injections ranges between 1.4% and 6%. If you develop a headache that does not seem to respond to conservative therapy, please let your physician know. This can be treated with an epidural blood patch.   Post-procedure numbness or redness is to be expected, however it should average 4 to 6 hours. If numbness and weakness of your extremities begins to develop 4 to 6 hours after your procedure, and is felt to be progressing and worsening, immediately contact your physician.   Diet:  If you experience nausea, do not eat until this sensation goes away. If you had a Stellate Ganglion Block for upper extremity Reflex Sympathetic Dystrophy, do not eat or drink  until your hoarseness goes away. In any case, always start with liquids first and if you tolerate them well, then slowly progress to more solid foods. Activity:  For the first 4 to 6 hours after the  procedure, use caution in moving about as you may experience numbness and/or weakness. Use caution in cooking, using household electrical appliances, and climbing steps. If you need to reach your Doctor call our office: [phone] Monday-Thursday 8:00 am - 4:00 PM    Fridays: Closed     In case of an emergency: In case of emergency, call 911 or go to the nearest emergency room and have the physician there call us.  Interpretation of Procedure Every nerve block has two components: a diagnostic component, and a treatment component. Unrealistic expectations are the most common causes of perceived failure.  In a perfect world, a single nerve block should be able to completely and permanently eliminate the pain. Sadly, the world is not perfect.  Most pain management nerve blocks are performed using local anesthetics and steroids. Steroids are responsible for any long-term benefit that you may experience. Their purpose is to decrease any chronic swelling that may exist in the area. Steroids begin to work immediately after being injected. However, most patients will not experience any benefits until 5 to 10 days after the injection, when the swelling has come down to the point where they can tell a difference. Steroids will only help if there is swelling to be treated. As such, they can assist with the diagnosis. If effective, they suggest an inflammatory component to the pain, and if ineffective, they rule out inflammation as the main cause or component of the problem. If the problem is one of mechanical compression, you will get no benefit from those steroids.   In the case of local anesthetics, they have a crucial role in the diagnosis of your condition. Most will begin to work within15 to 20 minutes after injection. The duration will depend on the type used (short- vs. Long-acting). It is of outmost importance that patients keep tract of their pain, after the procedure. To assist with this matter, a  Post-procedure Pain Diary is provided. Make sure to complete it and to bring it back to your follow-up appointment.  As long as the patient keeps accurate, detailed records of their symptoms after every procedure, and returns to have those interpreted, every procedure will provide [place] with invaluable information. Even a block that does not provide the patient with any relief, will always provide [place] with information about the mechanism and the origin of the pain. The only time a nerve block can be considered a waste of time is when patients do not keep track of the results, or do not keep their post-procedure appointment.  Reporting the results back to your physician The Pain Score  Pain is a subjective complaint. It cannot be seen, touched, or measured. We depend entirely on the patients report of the pain in order to assess your condition and treatment. To evaluate the pain, we use a pain scale, where 0 means No Pain, and a 10 is the worst possible pain that you can even imagine (i.e. something like been eaten alive by a shark or being torn apart by a lion).   You will frequently be asked to rate your pain. Please be as accurate, remember that medical decisions will be based on your responses. Please do not rate your pain above a 10. Doing so is actually interpreted as symptom magnification (  exaggeration), as well as lack of understanding with regards to the scale. To put this into perspective, when you tell us that your pain is at a 10 (ten), what you are saying is that there is nothing we can do to make this pain any worse. (Carefully think about that.)Facet Blocks Patient Information  Description: The facets are joints in the spine between the vertebrae.  Like any joints in the body, facets can become irritated and painful.  Arthritis can also effect the facets.  By injecting steroids and local anesthetic in and around these joints, we can temporarily block the nerve supply to them.   Steroids act directly on irritated nerves and tissues to reduce selling and inflammation which often leads to decreased pain.  Facet blocks may be done anywhere along the spine from the neck to the low back depending upon the location of your pain.   After numbing the skin with local anesthetic (like Novocaine), a small needle is passed onto the facet joints under x-ray guidance.  You may experience a sensation of pressure while this is being done.  The entire block usually lasts about 15-25 minutes.   Conditions which may be treated by facet blocks:   Low back/buttock pain  Neck/shoulder pain  Certain types of headaches  Preparation for the injection:  1. Do not eat any solid food or dairy products within 8 hours of your appointment. 2. You may drink clear liquid up to 3 hours before appointment.  Clear liquids include water, black coffee, juice or soda.  No milk or cream please. 3. You may take your regular medication, including pain medications, with a sip of water before your appointment.  Diabetics should hold regular insulin (if taken separately) and take 1/2 normal NPH dose the morning of the procedure.  Carry some sugar containing items with you to your appointment. 4. A driver must accompany you and be prepared to drive you home after your procedure. 5. Bring all your current medications with you. 6. An IV may be inserted and sedation may be given at the discretion of the physician. 7. A blood pressure cuff, EKG and other monitors will often be applied during the procedure.  Some patients may need to have extra oxygen administered for a short period. 8. You will be asked to provide medical information, including your allergies and medications, prior to the procedure.  We must know immediately if you are taking blood thinners (like Coumadin/Warfarin) or if you are allergic to IV iodine contrast (dye).  We must know if you could possible be pregnant.  Possible  side-effects:   Bleeding from needle site  Infection (rare, may require surgery)  Nerve injury (rare)  Numbness & tingling (temporary)  Difficulty urinating (rare, temporary)  Spinal headache (a headache worse with upright posture)  Light-headedness (temporary)  Pain at injection site (serveral days)  Decreased blood pressure (rare, temporary)  Weakness in arm/leg (temporary)  Pressure sensation in back/neck (temporary)   Call if you experience:   Fever/chills associated with headache or increased back/neck pain  Headache worsened by an upright position  New onset, weakness or numbness of an extremity below the injection site  Hives or difficulty breathing (go to the emergency room)  Inflammation or drainage at the injection site(s)  Severe back/neck pain greater than usual  New symptoms which are concerning to you  Please note:  Although the local anesthetic injected can often make your back or neck feel good for several hours after the injection,  the pain will likely return. It takes 3-7 days for steroids to work.  You may not notice any pain relief for at least one week.  If effective, we will often do a series of 2-3 injections spaced 3-6 weeks apart to maximally decrease your pain.  After the initial series, you may be a candidate for a more permanent nerve block of the facets.  If you have any questions, please call #[phone]) [facility]

## 2017-10-21 NOTE — Progress Notes (Signed)
Patient's Name: Stacy Martinez  MRN: [id number]  Referring Provider: Mikey College, *  DOB: November 28, 1955  PCP: Mikey College, NP  DOS: 10/21/2017  Note by: Gillis Santa, MD  Service setting: Ambulatory outpatient  Specialty: Interventional Pain Management  Patient type: Established  Location: [facility] (AMB) Pain Management Facility  Visit type: Interventional Procedure   Primary Reason for Visit: Interventional Pain Management Treatment. CC: Back Pain (lower, bilat)  Procedure:       Anesthesia, Analgesia, Anxiolysis:  Type: Lumbar Facet, Medial Branch Block #4  Level:  L3, L4, L5, Medial Branch Level(s).  Primary Purpose: Therapeutic Region: Posterolateral Lumbosacral Spine Laterality: Bilateral  Type: Local Anesthesia with Moderate (Conscious) Sedation Local Anesthetic: Lidocaine 1% Route: Intravenous (IV) IV Access: Secured Sedation: Meaningful verbal contact was maintained at all times during the procedure  Indication(s): Analgesia and Anxiety   Indications: 1. Spondylosis without myelopathy or radiculopathy, lumbar region   2. Facet syndrome, lumbar   3. Lumbar spondylosis   4. DDD (degenerative disc disease), lumbar    Pain Score: Pre-procedure: 6 /10 Post-procedure: 0-No pain/10  Pre-op Assessment:  Stacy Martinez is a 62 y.o. (year old), female patient, seen today for interventional treatment. She  has a past surgical history that includes Colon surgery; Hernia repair; and Wrist fracture surgery (Right). Stacy Martinez has a current medication list which includes the following prescription(s): amlodipine, carvedilol, empagliflozin, enalapril, furosemide, glucose blood, relion ultra thin lancets, rosuvastatin, tizanidine, semaglutide, tramadol, and metformin, and the following Facility-Administered Medications: bupivacaine (pf), cefazolin, fentanyl, fentanyl, lactated ringers, lactated ringers, lactated ringers, lidocaine (pf), midazolam, orphenadrine, sodium chloride flush, and  triamcinolone acetonide. Her primarily concern today is the Back Pain (lower, bilat)  Initial Vital Signs:  Pulse Rate: 75 Temp: 97.9 F (36.6 C) Resp: 16 BP: 124/64 SpO2: 97 %  BMI: Estimated body mass index is 43.9 kg/m as calculated from the following:   Height as of this encounter: 5\' 2"  (1.575 m).   Weight as of this encounter: 240 lb (108.9 kg).  Risk Assessment: Allergies: Reviewed. She is allergic to prednisone.  Allergy Precautions: None required Coagulopathies: Reviewed. None identified.  Blood-thinner therapy: None at this time Active Infection(s): Reviewed. None identified. Stacy Martinez is afebrile  Site Confirmation: Stacy Martinez was asked to confirm the procedure and laterality before marking the site Procedure checklist: Completed Consent: Before the procedure and under the influence of no sedative(s), amnesic(s), or anxiolytics, the patient was informed of the treatment options, risks and possible complications. To fulfill our ethical and legal obligations, as recommended by the American Medical Association's Code of Ethics, I have informed the patient of my clinical impression; the nature and purpose of the treatment or procedure; the risks, benefits, and possible complications of the intervention; the alternatives, including doing nothing; the risk(s) and benefit(s) of the alternative treatment(s) or procedure(s); and the risk(s) and benefit(s) of doing nothing. The patient was provided information about the general risks and possible complications associated with the procedure. These may include, but are not limited to: failure to achieve desired goals, infection, bleeding, organ or nerve damage, allergic reactions, paralysis, and death. In addition, the patient was informed of those risks and complications associated to Spine-related procedures, such as failure to decrease pain; infection (i.e.: Meningitis, epidural or intraspinal abscess); bleeding (i.e.: epidural hematoma,  subarachnoid hemorrhage, or any other type of intraspinal or peri-dural bleeding); organ or nerve damage (i.e.: Any type of peripheral nerve, nerve root, or spinal cord injury) with subsequent damage to sensory,  motor, and/or autonomic systems, resulting in permanent pain, numbness, and/or weakness of one or several areas of the body; allergic reactions; (i.e.: anaphylactic reaction); and/or death. Furthermore, the patient was informed of those risks and complications associated with the medications. These include, but are not limited to: allergic reactions (i.e.: anaphylactic or anaphylactoid reaction(s)); adrenal axis suppression; blood sugar elevation that in diabetics may result in ketoacidosis or comma; water retention that in patients with history of congestive heart failure may result in shortness of breath, pulmonary edema, and decompensation with resultant heart failure; weight gain; swelling or edema; medication-induced neural toxicity; particulate matter embolism and blood vessel occlusion with resultant organ, and/or nervous system infarction; and/or aseptic necrosis of one or more joints. Finally, the patient was informed that Medicine is not an exact science; therefore, there is also the possibility of unforeseen or unpredictable risks and/or possible complications that may result in a catastrophic outcome. The patient indicated having understood very clearly. We have given the patient no guarantees and we have made no promises. Enough time was given to the patient to ask questions, all of which were answered to the patient's satisfaction. Stacy Martinez has indicated that she wanted to continue with the procedure. Attestation: I, the ordering provider, attest that I have discussed with the patient the benefits, risks, side-effects, alternatives, likelihood of achieving goals, and potential problems during recovery for the procedure that I have provided informed consent. Date  Time: 10/21/2017  8:46  AM  Pre-Procedure Preparation:  Monitoring: As per clinic protocol. Respiration, ETCO2, SpO2, BP, heart rate and rhythm monitor placed and checked for adequate function Safety Precautions: Patient was assessed for positional comfort and pressure points before starting the procedure. Time-out: I initiated and conducted the "Time-out" before starting the procedure, as per protocol. The patient was asked to participate by confirming the accuracy of the "Time Out" information. Verification of the correct person, site, and procedure were performed and confirmed by me, the nursing staff, and the patient. "Time-out" conducted as per Joint Commission's Universal Protocol (UP.01.01.01). Time: 0920  Description of Procedure:       Position: Prone Laterality: Bilateral. The procedure was performed in identical fashion on both sides. Levels:  L3, L4, L5, Medial Branch Level(s) Area Prepped: Lumbosacral Prepping solution: ChloraPrep (2% chlorhexidine gluconate and 70% isopropyl alcohol) Safety Precautions: Aspiration looking for blood return was conducted prior to all injections. At no point did we inject any substances, as a needle was being advanced. Before injecting, the patient was told to immediately notify me if she was experiencing any new onset of "ringing in the ears, or metallic taste in the mouth". No attempts were made at seeking any paresthesias. Safe injection practices and needle disposal techniques used. Medications properly checked for expiration dates. SDV (single dose vial) medications used. After the completion of the procedure, all disposable equipment used was discarded in the proper designated medical waste containers. Local Anesthesia: Protocol guidelines were followed. The patient was positioned over the fluoroscopy table. The area was prepped in the usual manner. The time-out was completed. The target area was identified using fluoroscopy. A 12-in long, straight, sterile hemostat was used  with fluoroscopic guidance to locate the targets for each level blocked. Once located, the skin was marked with an approved surgical skin marker. Once all sites were marked, the skin (epidermis, dermis, and hypodermis), as well as deeper tissues (fat, connective tissue and muscle) were infiltrated with a small amount of a short-acting local anesthetic, loaded on a 10cc syringe  with a 25G, 1.5-in  Needle. An appropriate amount of time was allowed for local anesthetics to take effect before proceeding to the next step. Local Anesthetic: Lidocaine 1.0% The unused portion of the local anesthetic was discarded in the proper designated containers. Technical explanation of process:   L3 Medial Branch Nerve Block (MBB): The target area for the L3 medial branch is at the junction of the postero-lateral aspect of the superior articular process and the superior, posterior, and medial edge of the transverse process of L4. Under fluoroscopic guidance, a Quincke needle was inserted until contact was made with os over the superior postero-lateral aspect of the pedicular shadow (target area). After negative aspiration for blood, 0.5 mL of the nerve block solution was injected without difficulty or complication. The needle was removed intact. L4 Medial Branch Nerve Block (MBB): The target area for the L4 medial branch is at the junction of the postero-lateral aspect of the superior articular process and the superior, posterior, and medial edge of the transverse process of L5. Under fluoroscopic guidance, a Quincke needle was inserted until contact was made with os over the superior postero-lateral aspect of the pedicular shadow (target area). After negative aspiration for blood, 0.5 mL of the nerve block solution was injected without difficulty or complication. The needle was removed intact. L5 Medial Branch Nerve Block (MBB): The target area for the L5 medial branch is at the junction of the postero-lateral aspect of the  superior articular process and the superior, posterior, and medial edge of the sacral ala. Under fluoroscopic guidance, a Quincke needle was inserted until contact was made with os over the superior postero-lateral aspect of the pedicular shadow (target area). After negative aspiration for blood, 0.5 mL of the nerve block solution was injected without difficulty or complication. The needle was removed intact.  Procedural Needles: 22-gauge, 3.5-inch, Quincke needles used for all levels. Nerve block solution: 0.2% PF-Ropivacaine9 cc +1 cc of Decadron 10 mg/cc The unused portion of the solution was discarded in the proper designated containers.  Once the entire procedure was completed, the treated area was cleaned, making sure to leave some of the prepping solution back to take advantage of its long term bactericidal properties.   Illustration of the posterior view of the lumbar spine and the posterior neural structures. Laminae of L2 through S1 are labeled. DPRL5, dorsal primary ramus of L5; DPRS1, dorsal primary ramus of S1; DPR3, dorsal primary ramus of L3; FJ, facet (zygapophyseal) joint L3-L4; I, inferior articular process of L4; LB1, lateral branch of dorsal primary ramus of L1; IAB, inferior articular branches from L3 medial branch (supplies L4-L5 facet joint); IBP, intermediate branch plexus; MB3, medial branch of dorsal primary ramus of L3; NR3, third lumbar nerve root; S, superior articular process of L5; SAB, superior articular branches from L4 (supplies L4-5 facet joint also); TP3, transverse process of L3.  Vitals:   10/21/17 0940 10/21/17 0947 10/21/17 0959 10/21/17 1006  BP: 139/68 (!) 131/52 (!) 114/94 107/82  Pulse: 65     Resp: 13 14 18 20   Temp:  (!) 97.3 F (36.3 C)  98 F (36.7 C)  TempSrc:      SpO2: 100% 99% 98% 100%  Weight:      Height:        Start Time:   hrs. End Time: 0940 hrs. Imaging Guidance (Spinal):  Type of Imaging Technique: Fluoroscopy Guidance  (Spinal) Indication(s): Assistance in needle guidance and placement for procedures requiring needle placement in or near  specific anatomical locations not easily accessible without such assistance. Exposure Time: Please see nurses notes. Contrast: None used. Fluoroscopic Guidance: I was personally present during the use of fluoroscopy. "Tunnel Vision Technique" used to obtain the best possible view of the target area. Parallax error corrected before commencing the procedure. "Direction-depth-direction" technique used to introduce the needle under continuous pulsed fluoroscopy. Once target was reached, antero-posterior, oblique, and lateral fluoroscopic projection used confirm needle placement in all planes. Images permanently stored in EMR. Interpretation: No contrast injected. I personally interpreted the imaging intraoperatively. Adequate needle placement confirmed in multiple planes. Permanent images saved into the patient's record.  Antibiotic Prophylaxis:   Anti-infectives (From admission, onward)   None     Indication(s): None identified  Post-operative Assessment:  Post-procedure Vital Signs:  Pulse Rate: 65 Temp: 98 F (36.7 C) Resp: 20 BP: 107/82 SpO2: 100 %  EBL: None  Complications: No immediate post-treatment complications observed by team, or reported by patient.  Note: The patient tolerated the entire procedure well. A repeat set of vitals were taken after the procedure and the patient was kept under observation following institutional policy, for this type of procedure. Post-procedural neurological assessment was performed, showing return to baseline, prior to discharge. The patient was provided with post-procedure discharge instructions, including a section on how to identify potential problems. Should any problems arise concerning this procedure, the patient was given instructions to immediately contact us, at any time, without hesitation. In any case, we plan to contact  the patient by telephone for a follow-up status report regarding this interventional procedure.  Comments:  No additional relevant information. 5 out of 5 strength bilateral lower extremity: Plantar flexion, dorsiflexion, knee flexion, knee extension.  Plan of Care    Imaging Orders     DG C-Arm 1-60 Min-No Report Procedure Orders    No procedure(s) ordered today    Medications ordered for procedure: Meds ordered this encounter  Medications  . lactated ringers infusion 1,000 mL  . fentaNYL (SUBLIMAZE) injection 25-100 mcg    Make sure Narcan is available in the pyxis when using this medication. In the event of respiratory depression (RR< 8/min): Titrate NARCAN (naloxone) in increments of 0.1 to 0.2 mg IV at 2-3 minute intervals, until desired degree of reversal.  . lidocaine (XYLOCAINE) 1 % (with pres) injection 10 mL  . ropivacaine (PF) 2 mg/mL (0.2%) (NAROPIN) injection 10 mL  . dexamethasone (DECADRON) injection 10 mg   Medications administered: We administered lactated ringers, fentaNYL, lidocaine, ropivacaine (PF) 2 mg/mL (0.2%), and dexamethasone.  See the medical record for exact dosing, route, and time of administration.  New Prescriptions   No medications on file   Disposition: Discharge home  Discharge Date & Time: 10/21/2017; 1007 hrs.   Physician-requested Follow-up: Return in about 6 weeks (around 12/02/2017) for Post Procedure Evaluation.  Future Appointments  Date Time Provider [place]  12/02/2017  8:45 AM Gillis Santa, MD [facility] None   Primary Care Physician: Mikey College, NP Location: [facility] Note by: Gillis Santa, MD Date: 10/21/2017; Time: 10:40 AM  Disclaimer:  Medicine is not an exact science. The only guarantee in medicine is that nothing is guaranteed. It is important to note that the decision to proceed with this intervention was based on the information collected from the patient. The Data and  conclusions were drawn from the patient's questionnaire, the interview, and the physical examination. Because the information was provided in large part by the patient, it cannot  be guaranteed that it has not been purposely or unconsciously manipulated. Every effort has been made to obtain as much relevant data as possible for this evaluation. It is important to note that the conclusions that lead to this procedure are derived in large part from the available data. Always take into account that the treatment will also be dependent on availability of resources and existing treatment guidelines, considered by other Pain Management Practitioners as being common knowledge and practice, at the time of the intervention. For Medico-Legal purposes, it is also important to point out that variation in procedural techniques and pharmacological choices are the acceptable norm. The indications, contraindications, technique, and results of the above procedure should only be interpreted and judged by a Board-Certified Interventional Pain Specialist with extensive familiarity and expertise in the same exact procedure and technique.

## 2017-10-21 NOTE — Progress Notes (Deleted)
Patient's Name: Stacy Martinez  MRN: [id number]  Referring Provider: Mikey College, *  DOB: Jan 15, 1956  PCP: Mikey College, NP  DOS: 10/21/2017  Note by: Gillis Santa, MD  Service setting: Ambulatory outpatient  Specialty: Interventional Pain Management  Patient type: Established  Location: [facility] (AMB) Pain Management Facility  Visit type: Interventional Procedure   Primary Reason for Visit: Interventional Pain Management Treatment. CC: Back Pain (lower, bilat)  Procedure  Anesthesia, Analgesia, Anxiolysis:  Type: Lumbar Facet, Medial Branch Block #4  Level: L2, L3, L4, L5, & S1 Medial Branch Level(s). Lesioning of these levels should completely denervate the L3-4, L4-5, and the L5-S1 lumbar facet joints. Primary Purpose: Therapeutic Region: Posterolateral Lumbosacral Spine Laterality: Bilateral  Type: Local Anesthesia with Moderate (Conscious) Sedation Local Anesthetic: Lidocaine 1% Route: Intravenous (IV) IV Access: Secured Sedation: Meaningful verbal contact was maintained at all times during the procedure  Indication(s): Analgesia and Anxiety   Indications: No diagnosis found. Pain Score: Pre-procedure: 6 /10 Post-procedure: 6 /10  Pre-op Assessment:  Stacy Martinez is a 62 y.o. (year old), female patient, seen today for interventional treatment. She  has a past surgical history that includes Colon surgery; Hernia repair; and Wrist fracture surgery (Right). Stacy Martinez has a current medication list which includes the following prescription(s): amlodipine, carvedilol, empagliflozin, enalapril, furosemide, glucose blood, relion ultra thin lancets, rosuvastatin, semaglutide, tizanidine, tramadol, and metformin, and the following Facility-Administered Medications: bupivacaine (pf), cefazolin, fentanyl, lactated ringers, lactated ringers, lactated ringers, lidocaine (pf), midazolam, orphenadrine, sodium chloride flush, and triamcinolone acetonide. Her primarily concern today is the Back  Pain (lower, bilat)  Initial Vital Signs:  Pulse Rate: 75 Temp: 97.9 F (36.6 C) Resp: 16 BP: 124/64 SpO2: 97 %  BMI: Estimated body mass index is 43.9 kg/m as calculated from the following:   Height as of this encounter: 5\' 2"  (1.575 m).   Weight as of this encounter: 240 lb (108.9 kg).  Risk Assessment: Allergies: Reviewed. She is allergic to prednisone.  Allergy Precautions: None required Coagulopathies: Reviewed. None identified.  Blood-thinner therapy: None at this time Active Infection(s): Reviewed. None identified. Stacy Martinez is afebrile  Site Confirmation: Stacy Martinez was asked to confirm the procedure and laterality before marking the site Procedure checklist: Completed Consent: Before the procedure and under the influence of no sedative(s), amnesic(s), or anxiolytics, the patient was informed of the treatment options, risks and possible complications. To fulfill our ethical and legal obligations, as recommended by the American Medical Association's Code of Ethics, I have informed the patient of my clinical impression; the nature and purpose of the treatment or procedure; the risks, benefits, and possible complications of the intervention; the alternatives, including doing nothing; the risk(s) and benefit(s) of the alternative treatment(s) or procedure(s); and the risk(s) and benefit(s) of doing nothing. The patient was provided information about the general risks and possible complications associated with the procedure. These may include, but are not limited to: failure to achieve desired goals, infection, bleeding, organ or nerve damage, allergic reactions, paralysis, and death. In addition, the patient was informed of those risks and complications associated to Spine-related procedures, such as failure to decrease pain; infection (i.e.: Meningitis, epidural or intraspinal abscess); bleeding (i.e.: epidural hematoma, subarachnoid hemorrhage, or any other type of intraspinal or  peri-dural bleeding); organ or nerve damage (i.e.: Any type of peripheral nerve, nerve root, or spinal cord injury) with subsequent damage to sensory, motor, and/or autonomic systems, resulting in permanent pain, numbness, and/or weakness of one or several areas  of the body; allergic reactions; (i.e.: anaphylactic reaction); and/or death. Furthermore, the patient was informed of those risks and complications associated with the medications. These include, but are not limited to: allergic reactions (i.e.: anaphylactic or anaphylactoid reaction(s)); adrenal axis suppression; blood sugar elevation that in diabetics may result in ketoacidosis or comma; water retention that in patients with history of congestive heart failure may result in shortness of breath, pulmonary edema, and decompensation with resultant heart failure; weight gain; swelling or edema; medication-induced neural toxicity; particulate matter embolism and blood vessel occlusion with resultant organ, and/or nervous system infarction; and/or aseptic necrosis of one or more joints. Finally, the patient was informed that Medicine is not an exact science; therefore, there is also the possibility of unforeseen or unpredictable risks and/or possible complications that may result in a catastrophic outcome. The patient indicated having understood very clearly. We have given the patient no guarantees and we have made no promises. Enough time was given to the patient to ask questions, all of which were answered to the patient's satisfaction. Stacy Martinez has indicated that she wanted to continue with the procedure. Attestation: I, the ordering provider, attest that I have discussed with the patient the benefits, risks, side-effects, alternatives, likelihood of achieving goals, and potential problems during recovery for the procedure that I have provided informed consent. Date  Time: {CHL [facility]-PAIN TIME CHOICES:21018001}  Pre-Procedure Preparation:  Monitoring:  As per clinic protocol. Respiration, ETCO2, SpO2, BP, heart rate and rhythm monitor placed and checked for adequate function Safety Precautions: Patient was assessed for positional comfort and pressure points before starting the procedure. Time-out: I initiated and conducted the "Time-out" before starting the procedure, as per protocol. The patient was asked to participate by confirming the accuracy of the "Time Out" information. Verification of the correct person, site, and procedure were performed and confirmed by me, the nursing staff, and the patient. "Time-out" conducted as per Joint Commission's Universal Protocol (UP.01.01.01). Time:    Description of Procedure:       Position: Prone Laterality: Bilateral. The procedure was performed in identical fashion on both sides. Levels:  L3, L4, L5,  Medial Branch Level(s) Area Prepped: Lumbosacral Prepping solution: ChloraPrep (2% chlorhexidine gluconate and 70% isopropyl alcohol) Safety Precautions: Aspiration looking for blood return was conducted prior to all injections. At no point did we inject any substances, as a needle was being advanced. Before injecting, the patient was told to immediately notify me if she was experiencing any new onset of "ringing in the ears, or metallic taste in the mouth". No attempts were made at seeking any paresthesias. Safe injection practices and needle disposal techniques used. Medications properly checked for expiration dates. SDV (single dose vial) medications used. After the completion of the procedure, all disposable equipment used was discarded in the proper designated medical waste containers. Local Anesthesia: Protocol guidelines were followed. The patient was positioned over the fluoroscopy table. The area was prepped in the usual manner. The time-out was completed. The target area was identified using fluoroscopy. A 12-in long, straight, sterile hemostat was used with fluoroscopic guidance to locate the targets  for each level blocked. Once located, the skin was marked with an approved surgical skin marker. Once all sites were marked, the skin (epidermis, dermis, and hypodermis), as well as deeper tissues (fat, connective tissue and muscle) were infiltrated with a small amount of a short-acting local anesthetic, loaded on a 10cc syringe with a 25G, 1.5-in  Needle. An appropriate amount of time was allowed for  local anesthetics to take effect before proceeding to the next step. Local Anesthetic: Lidocaine 2.0% The unused portion of the local anesthetic was discarded in the proper designated containers. Technical explanation of process:  @[id number]  DESCRIPTION@ Once the entire procedure was completed, the treated area was cleaned, making sure to leave some of the prepping solution back to take advantage of its long term bactericidal properties.   Illustration of the posterior view of the lumbar spine and the posterior neural structures. Laminae of L2 through S1 are labeled. DPRL5, dorsal primary ramus of L5; DPRS1, dorsal primary ramus of S1; DPR3, dorsal primary ramus of L3; FJ, facet (zygapophyseal) joint L3-L4; I, inferior articular process of L4; LB1, lateral branch of dorsal primary ramus of L1; IAB, inferior articular branches from L3 medial branch (supplies L4-L5 facet joint); IBP, intermediate branch plexus; MB3, medial branch of dorsal primary ramus of L3; NR3, third lumbar nerve root; S, superior articular process of L5; SAB, superior articular branches from L4 (supplies L4-5 facet joint also); TP3, transverse process of L3.  Vitals:   10/21/17 0843  BP: 124/64  Pulse: 75  Resp: 16  Temp: 97.9 F (36.6 C)  TempSrc: Oral  SpO2: 97%  Weight: 240 lb (108.9 kg)  Height: 5\' 2"  (1.575 m)    Start Time:   hrs. End Time:   hrs. Imaging Guidance (Spinal):  Type of Imaging Technique: Fluoroscopy Guidance (Spinal) Indication(s): Assistance in needle guidance and placement for procedures requiring  needle placement in or near specific anatomical locations not easily accessible without such assistance. Exposure Time: Please see nurses notes. Contrast: None used. Fluoroscopic Guidance: I was personally present during the use of fluoroscopy. "Tunnel Vision Technique" used to obtain the best possible view of the target area. Parallax error corrected before commencing the procedure. "Direction-depth-direction" technique used to introduce the needle under continuous pulsed fluoroscopy. Once target was reached, antero-posterior, oblique, and lateral fluoroscopic projection used confirm needle placement in all planes. Images permanently stored in EMR. Interpretation: No contrast injected. I personally interpreted the imaging intraoperatively. Adequate needle placement confirmed in multiple planes. Permanent images saved into the patient's record.  Antibiotic Prophylaxis:   Anti-infectives (From admission, onward)   None     Indication(s): None identified  Post-operative Assessment:  Post-procedure Vital Signs:  Pulse Rate: 75 Temp: 97.9 F (36.6 C) Resp: 16 BP: 124/64 SpO2: 97 %  EBL: None  Complications: No immediate post-treatment complications observed by team, or reported by patient.  Note: The patient tolerated the entire procedure well. A repeat set of vitals were taken after the procedure and the patient was kept under observation following institutional policy, for this type of procedure. Post-procedural neurological assessment was performed, showing return to baseline, prior to discharge. The patient was provided with post-procedure discharge instructions, including a section on how to identify potential problems. Should any problems arise concerning this procedure, the patient was given instructions to immediately contact us, at any time, without hesitation. In any case, we plan to contact the patient by telephone for a follow-up status report regarding this interventional  procedure.  Comments:  No additional relevant information.  Plan of Care   Imaging Orders  No imaging studies ordered today   Procedure Orders    No procedure(s) ordered today    Medications ordered for procedure: No orders of the defined types were placed in this encounter.  Medications administered: Laylah Riga. Start had no medications administered during this visit.  See the medical record for exact dosing, route, and  time of administration.  New Prescriptions   No medications on file   Disposition: Discharge home  Discharge Date & Time: 10/21/2017;   hrs.   Physician-requested Follow-up: No Follow-up on file.  Future Appointments  Date Time Provider [place]  10/21/2017  9:00 AM Gillis Santa, MD [facility] None   Primary Care Physician: Mikey College, NP Location: [facility] Note by: Gillis Santa, MD Date: 10/21/2017; Time: 8:49 AM  Disclaimer:  Medicine is not an exact science. The only guarantee in medicine is that nothing is guaranteed. It is important to note that the decision to proceed with this intervention was based on the information collected from the patient. The Data and conclusions were drawn from the patient's questionnaire, the interview, and the physical examination. Because the information was provided in large part by the patient, it cannot be guaranteed that it has not been purposely or unconsciously manipulated. Every effort has been made to obtain as much relevant data as possible for this evaluation. It is important to note that the conclusions that lead to this procedure are derived in large part from the available data. Always take into account that the treatment will also be dependent on availability of resources and existing treatment guidelines, considered by other Pain Management Practitioners as being common knowledge and practice, at the time of the intervention. For Medico-Legal purposes, it is also  important to point out that variation in procedural techniques and pharmacological choices are the acceptable norm. The indications, contraindications, technique, and results of the above procedure should only be interpreted and judged by a Board-Certified Interventional Pain Specialist with extensive familiarity and expertise in the same exact procedure and technique.

## 2017-10-21 NOTE — Progress Notes (Signed)
Safety precautions to be maintained throughout the outpatient stay will include: orient to surroundings, keep bed in low position, maintain call bell within reach at all times, provide assistance with transfer out of bed and ambulation.  

## 2017-10-22 ENCOUNTER — Telehealth: Payer: Self-pay

## 2017-10-22 NOTE — Telephone Encounter (Signed)
No answer, left message

## 2017-10-27 ENCOUNTER — Other Ambulatory Visit: Payer: Self-pay | Admitting: Nurse Practitioner

## 2017-10-27 DIAGNOSIS — I10 Essential (primary) hypertension: Secondary | ICD-10-CM

## 2017-10-28 NOTE — Telephone Encounter (Signed)
Pt must come to appointments here to continue getting medication.  Her BP is rising again as noted at her last pain management appointment.  I will send one 30-day supply again, but this is the absolute last one prior to her next appointment with me.  She was last seen in August 2018.  I requested followup a 3 months.  We are now approaching 7 months since last visit.  Please schedule an appointment about 2 weeks after resuming medication.

## 2017-10-29 ENCOUNTER — Telehealth: Payer: Self-pay | Admitting: *Deleted

## 2017-10-29 NOTE — Telephone Encounter (Signed)
Voicemail left for patient for additional information about muscle relaxer that was to be prescribed.

## 2017-11-02 ENCOUNTER — Telehealth: Payer: Self-pay | Admitting: *Deleted

## 2017-11-02 ENCOUNTER — Other Ambulatory Visit: Payer: Self-pay | Admitting: Student in an Organized Health Care Education/Training Program

## 2017-11-02 MED ORDER — TIZANIDINE HCL 4 MG PO CAPS: 4.0000 mg | ORAL_CAPSULE | Freq: Two times a day (BID) | ORAL | 4 refills | Status: DC | PRN

## 2017-11-02 NOTE — Progress Notes (Signed)
Rx called in for Tizanidine.  Requested Prescriptions   Signed Prescriptions Disp Refills  . tiZANidine (ZANAFLEX) 4 MG capsule 60 capsule 4    Sig: Take 1 capsule (4 mg total) by mouth 2 (two) times daily as needed for muscle spasms.

## 2017-11-02 NOTE — Telephone Encounter (Signed)
Spoke with patient and she is requesting Zanaflex to be refilled.  Spoke with Dr Holley Raring, zanaflex escribed to [facility], [address].

## 2017-11-03 ENCOUNTER — Ambulatory Visit: Payer: BLUE CROSS/BLUE SHIELD | Admitting: Nurse Practitioner

## 2017-11-03 ENCOUNTER — Other Ambulatory Visit: Payer: Self-pay

## 2017-11-03 ENCOUNTER — Encounter: Payer: Self-pay | Admitting: Nurse Practitioner

## 2017-11-03 VITALS — BP 107/84 | HR 62 | Temp 97.7°F | Ht 62.0 in | Wt 242.6 lb

## 2017-11-03 DIAGNOSIS — E1142 Type 2 diabetes mellitus with diabetic polyneuropathy: Secondary | ICD-10-CM

## 2017-11-03 DIAGNOSIS — I10 Essential (primary) hypertension: Secondary | ICD-10-CM | POA: Diagnosis not present

## 2017-11-03 LAB — POCT GLYCOSYLATED HEMOGLOBIN (HGB A1C): Hemoglobin A1C: 9.2

## 2017-11-03 MED ORDER — SITAGLIPTIN PHOSPHATE 100 MG PO TABS
100.0000 mg | ORAL_TABLET | Freq: Every day | ORAL | 1 refills | Status: DC
Start: 2017-11-03 — End: 2018-06-09

## 2017-11-03 MED ORDER — METFORMIN HCL ER (OSM) 500 MG PO TB24: 500.0000 mg | ORAL_TABLET | Freq: Two times a day (BID) | ORAL | 1 refills | Status: DC

## 2017-11-03 MED ORDER — AMLODIPINE BESYLATE 10 MG PO TABS: ORAL_TABLET | ORAL | 3 refills | Status: DC

## 2017-11-03 NOTE — Progress Notes (Signed)
Subjective:    Patient ID: Stacy Martinez, female    DOB: [birth year], 62 y.o.   MRN: [id number]  Stacy Martinez is a 62 y.o. female presenting on 11/03/2017 for Diabetes   HPI Diabetes Pt presents today for follow up of Type 2 diabetes mellitus. She is checking fasting am CBG at home with a range of 180-250 - Current diabetic medications include: metformin 250 mg tid, Jardiance 25 mg once daily.  She started and took Ozempic for 2 months.  She stopped taking it because she had become severely nauseated.  Tolerated at low dose, but has decided to stop completely for now.  - She is symptomatic with polyuria, polydipsia.  - She denies polyuria, headaches, diaphoresis, shakiness, chills, pain, numbness or tingling in extremities and changes in vision.   - Clinical course has been worsening.  Pt reports increased stress and caregiving responsibilities for family members. - She  reports no regular exercise routine. - Her diet is moderate in salt, high in fat, and high in carbohydrates. - Weight trend: fluctuating a bit  PREVENTION: Eye exam current (within one year): no Foot exam current (within one year): no Lipid/ASCVD risk reduction - on statin: rosuvastatin 5 mg once daily and tolerates well Kidney protection - on ace or arb: enalapril 20 mg bid Recent Labs    01/06/17 04/08/17 0857 11/03/17 0926  HGBA1C 8.0 7.9* 9.2    Hypertension - She is checking BP at home or outside of clinic.  With readings slightly elevated at home to 130-140 SBP. - Current medications: amlodipine 10 mg once daily, carvedilol 25 mg bid wc, enalapril 20 mg bid, tolerating well without side effects .  She has been without amlodipine for about 3 days and has normal BP reading in clinic today. - She is not currently symptomatic. - Pt denies headache, lightheadedness, dizziness, changes in vision, chest tightness/pressure, palpitations, leg swelling, sudden loss of speech or loss of consciousness.  Social History    Tobacco Use  . Smoking status: Never Smoker  . Smokeless tobacco: Never Used  Substance Use Topics  . Alcohol use: No    Alcohol/week: 0.0 oz  . Drug use: No    Review of Systems Per HPI unless specifically indicated above     Objective:    BP 107/84 (BP Location: Right Arm, Patient Position: Sitting, Cuff Size: Large)   Pulse 62   Temp 97.7 F (36.5 C) (Oral)   Ht 5\' 2"  (1.575 m)   Wt 242 lb 9.6 oz (110 kg)   BMI 44.37 kg/m   Wt Readings from Last 3 Encounters:  11/03/17 242 lb 9.6 oz (110 kg)  10/21/17 240 lb (108.9 kg)  10/06/17 240 lb (108.9 kg)    Physical Exam  General - morbidly obese, well-appearing, NAD HEENT - Normocephalic, atraumatic Neck - supple, non-tender, no LAD, no carotid bruit Heart - RRR, no murmurs heard Lungs - Clear throughout all lobes, no wheezing, crackles, or rhonchi. Normal work of breathing. Extremeties - non-tender, trace pedal edema, cap refill < 2 seconds, peripheral pulses intact +2 bilaterally Skin - warm, dry Neuro - awake, alert, oriented x3, normal gait Psych - Normal mood and affect, normal behavior    Diabetic Foot Exam - Simple   Simple Foot Form Diabetic Foot exam was performed with the following findings:  Yes 11/03/2017  8:20 AM  Visual Inspection No deformities, no ulcerations, no other skin breakdown bilaterally:  Yes Sensation Testing Intact to touch and monofilament testing  bilaterally:  Yes Pulse Check Posterior Tibialis and Dorsalis pulse intact bilaterally:  Yes Comments     Results for orders placed or performed in visit on 11/03/17  POCT glycosylated hemoglobin (Hb A1C)  Result Value Ref Range   Hemoglobin A1C 9.2       Assessment & Plan:   Problem List Items Addressed This Visit      Cardiovascular and Mediastinum   BP (high blood pressure) Well controlled hypertension.  BP goal < 130/80.  Pt is not working on lifestyle modifications.  Taking medications tolerating well without side effects. No  currently identified complications.  Pt labs showed normal kidney function in August 2018  Plan: 1. Continue taking amlodipine, carvedilol, enalapril without changes.  Amlodipine refilled and stressed importance of keeping followup appointments. 2. Obtain labs CMP, lipid panel today  3. Encouraged heart healthy diet and increasing exercise to 30 minutes most days of the week. 4. Check BP 1-2 x per week at home, keep log, and bring to clinic at next appointment. 5. Follow up 3 months.    Relevant Medications   amLODipine (NORVASC) 10 MG tablet   Other Relevant Orders   COMPLETE METABOLIC PANEL WITH GFR   Lipid panel     Endocrine   DM type 2 with diabetic peripheral neuropathy ([place]) - Primary Uncontrolled DM with A1c 9.2 worsening control from 7.9 and goal A1c < 7.0%. Pt with dietary indiscretions and no regular self care except fasting CBG and foot checks.   - Complications - paresthesias, peripheral neuropathy and hyperglycemia.  Plan:  1. Change therapy: Change metformin to  2. Encourage improved lifestyle: - low carb/low glycemic diet handout provided - Increase physical activity to 30 minutes most days of the week.  Explained that increased physical activity increases body's use of sugar for energy. 3. Check fasting am CBG.  Bring log to next visit for review 4. Continue ASA, ACEi and Statin 5. DM Foot exam done today with normal findings.   and Advised to schedule DM ophtho exam, send record. 6. Follow-up 3 months or sooner if glycemic control worsening/not improving.    Relevant Medications   metformin (FORTAMET) 500 MG (OSM) 24 hr tablet   sitaGLIPtin (JANUVIA) 100 MG tablet   Other Relevant Orders   POCT glycosylated hemoglobin (Hb A1C) (Completed)   COMPLETE METABOLIC PANEL WITH GFR   Lipid panel      Meds ordered this encounter  Medications  . amLODipine (NORVASC) 10 MG tablet    Sig: TAKE 1 TABLET BY MOUTH ONCE DAILY    Dispense:  90 tablet    Refill:  3     Order Specific Question:   Supervising Provider    Answer:   Olin Hauser [[id number]]  . metformin (FORTAMET) 500 MG (OSM) 24 hr tablet    Sig: Take 1 tablet (500 mg total) by mouth 2 (two) times daily with a meal.    Dispense:  180 tablet    Refill:  1    Order Specific Question:   Supervising Provider    Answer:   Olin Hauser [[id number]]  . sitaGLIPtin (JANUVIA) 100 MG tablet    Sig: Take 1 tablet (100 mg total) by mouth daily.    Dispense:  90 tablet    Refill:  1    Order Specific Question:   Supervising Provider    Answer:   Olin Hauser [[id number]]      Follow up plan: Return in about 3 months (around  02/03/2018) for Diabetes.   Cassell Smiles, DNP, AGPCNP-BC Adult Gerontology Primary Care Nurse Practitioner [facility] 11/03/2017, 12:51 PM

## 2017-11-03 NOTE — Patient Instructions (Addendum)
Stacy Martinez, Thank you for coming in to clinic today.  1. START metformin ER 500 mg tablet twice daily.  If not tolerated, reduce to one tablet once daily.  2. START sitagliptin (Januvia) 100 mg tablet once daily.  3. Continue Jardiance.  4. Continue all BP meds without changes.  Please schedule a follow-up appointment with Cassell Smiles, AGNP. Return in about 3 months (around 02/03/2018) for Diabetes.  If you have any other questions or concerns, please feel free to call the clinic or send a message through [place]. You may also schedule an earlier appointment if necessary.  You will receive a survey after today's visit either digitally by e-mail or paper by [employer]. Your experiences and feedback matter to [place].  Please respond so we know how we are doing as we provide care for you.   Cassell Smiles, DNP, AGNP-BC Adult Gerontology Nurse Practitioner [place]

## 2017-11-04 ENCOUNTER — Other Ambulatory Visit: Payer: Self-pay

## 2017-11-04 LAB — COMPLETE METABOLIC PANEL WITH GFR
AG Ratio: 1.3 (calc) (ref 1.0–2.5)
ALT: 16 U/L (ref 6–29)
AST: 14 U/L (ref 10–35)
Albumin: 4 g/dL (ref 3.6–5.1)
Alkaline phosphatase (APISO): 114 U/L (ref 33–130)
BUN: 17 mg/dL (ref 7–25)
CO2: 26 mmol/L (ref 20–32)
Calcium: 9.3 mg/dL (ref 8.6–10.4)
Chloride: 101 mmol/L (ref 98–110)
Creat: 0.76 mg/dL (ref 0.50–0.99)
GFR, Est African American: 98 mL/min/{1.73_m2} (ref >=60)
GFR, Est Non African American: 85 mL/min/{1.73_m2} (ref >=60)
Globulin: 3.1 g/dL (calc) (ref 1.9–3.7)
Glucose, Bld: 213 mg/dL — ABNORMAL HIGH (ref 65–99)
Potassium: 4.4 mmol/L (ref 3.5–5.3)
Sodium: 136 mmol/L (ref 135–146)
Total Bilirubin: 0.5 mg/dL (ref 0.2–1.2)
Total Protein: 7.1 g/dL (ref 6.1–8.1)

## 2017-11-04 LAB — LIPID PANEL
Cholesterol: 204 mg/dL — ABNORMAL HIGH
HDL: 50 mg/dL — ABNORMAL LOW
LDL Cholesterol (Calc): 118 mg/dL — ABNORMAL HIGH
Non-HDL Cholesterol (Calc): 154 mg/dL — ABNORMAL HIGH
Total CHOL/HDL Ratio: 4.1 (calc)
Triglycerides: 238 mg/dL — ABNORMAL HIGH

## 2017-11-11 ENCOUNTER — Telehealth: Payer: Self-pay

## 2017-11-11 NOTE — Telephone Encounter (Signed)
Prior Auth Request for Metformin ER 500MG  is in process.

## 2017-12-02 ENCOUNTER — Ambulatory Visit
Payer: BLUE CROSS/BLUE SHIELD | Attending: Student in an Organized Health Care Education/Training Program | Admitting: Student in an Organized Health Care Education/Training Program

## 2017-12-02 ENCOUNTER — Other Ambulatory Visit: Payer: Self-pay

## 2017-12-02 ENCOUNTER — Encounter: Payer: Self-pay | Admitting: Student in an Organized Health Care Education/Training Program

## 2017-12-02 VITALS — BP 128/63 | HR 73 | Temp 97.7°F | Resp 16 | Ht 62.0 in | Wt 240.0 lb

## 2017-12-02 DIAGNOSIS — M47816 Spondylosis without myelopathy or radiculopathy, lumbar region: Secondary | ICD-10-CM | POA: Diagnosis not present

## 2017-12-02 DIAGNOSIS — M51369 Other intervertebral disc degeneration, lumbar region without mention of lumbar back pain or lower extremity pain: Secondary | ICD-10-CM

## 2017-12-02 DIAGNOSIS — M1710 Unilateral primary osteoarthritis, unspecified knee: Secondary | ICD-10-CM | POA: Diagnosis not present

## 2017-12-02 DIAGNOSIS — M5136 Other intervertebral disc degeneration, lumbar region: Secondary | ICD-10-CM | POA: Diagnosis not present

## 2017-12-02 DIAGNOSIS — E1141 Type 2 diabetes mellitus with diabetic mononeuropathy: Secondary | ICD-10-CM | POA: Diagnosis not present

## 2017-12-02 DIAGNOSIS — Z79899 Other long term (current) drug therapy: Secondary | ICD-10-CM | POA: Insufficient documentation

## 2017-12-02 DIAGNOSIS — M533 Sacrococcygeal disorders, not elsewhere classified: Secondary | ICD-10-CM | POA: Diagnosis not present

## 2017-12-02 DIAGNOSIS — E0841 Diabetes mellitus due to underlying condition with diabetic mononeuropathy: Secondary | ICD-10-CM

## 2017-12-02 DIAGNOSIS — Z7984 Long term (current) use of oral hypoglycemic drugs: Secondary | ICD-10-CM | POA: Insufficient documentation

## 2017-12-02 DIAGNOSIS — M1990 Unspecified osteoarthritis, unspecified site: Secondary | ICD-10-CM

## 2017-12-02 DIAGNOSIS — G8929 Other chronic pain: Secondary | ICD-10-CM | POA: Diagnosis not present

## 2017-12-02 DIAGNOSIS — E785 Hyperlipidemia, unspecified: Secondary | ICD-10-CM | POA: Diagnosis not present

## 2017-12-02 DIAGNOSIS — I10 Essential (primary) hypertension: Secondary | ICD-10-CM | POA: Insufficient documentation

## 2017-12-02 DIAGNOSIS — M545 Low back pain: Secondary | ICD-10-CM | POA: Diagnosis present

## 2017-12-02 DIAGNOSIS — I429 Cardiomyopathy, unspecified: Secondary | ICD-10-CM | POA: Insufficient documentation

## 2017-12-02 DIAGNOSIS — M25561 Pain in right knee: Secondary | ICD-10-CM | POA: Insufficient documentation

## 2017-12-02 DIAGNOSIS — M25562 Pain in left knee: Secondary | ICD-10-CM | POA: Insufficient documentation

## 2017-12-02 MED ORDER — DICLOFENAC SODIUM 75 MG PO TBEC: 75.0000 mg | DELAYED_RELEASE_TABLET | Freq: Two times a day (BID) | ORAL | 0 refills | Status: AC

## 2017-12-02 NOTE — Progress Notes (Signed)
Patient's Name: Stacy Martinez  MRN: [id number]  Referring Provider: Mikey College, *  DOB: September 21, 1955  PCP: Mikey College, NP  DOS: 12/02/2017  Note by: Gillis Santa, MD  Service setting: Ambulatory outpatient  Specialty: Interventional Pain Management  Location: [facility] (AMB) Pain Management Facility    Patient type: Established   Primary Reason(s) for Visit: Encounter for post-procedure evaluation of chronic illness with mild to moderate exacerbation CC: Back Pain (lower)  HPI  Stacy Martinez is a 62 y.o. year old, female patient, who comes today for a post-procedure evaluation. She has Bowel obstruction ([place]); BP (high blood pressure); DM type 2 with diabetic peripheral neuropathy ([place]); Wound of skin; Hyperlipidemia associated with type 2 diabetes mellitus ([place]); DDD (degenerative disc disease), lumbar; Cardiomyopathy ([place]); Arthritis; Facet syndrome, lumbar; Diabetic neuropathy ([facility]); DJD (degenerative joint disease) of knee; and Sacroiliac joint dysfunction on their problem list. Her primarily concern today is the Back Pain (lower)  Pain Assessment: Location: Right, Left, Lower Back Radiating: denies Onset:  > 3 months ago Duration: Chronic pain Quality: Aching, Constant, Dull Severity: 3 /10 (self-reported pain score)  Note: Reported level is compatible with observation.                         When using our objective Pain Scale, levels between 6 and 10/10 are said to belong in an emergency room, as it progressively worsens from a 6/10, described as severely limiting, requiring emergency care not usually available at an outpatient pain management facility. At a 6/10 level, communication becomes difficult and requires great effort. Assistance to reach the emergency department may be required. Facial flushing and profuse sweating along with potentially dangerous increases in heart rate and blood pressure will be evident. Effect on ADL: sometimes shooting pains prevent pt from being able  to stand Timing: Constant Modifying factors: sitting, medications, procedures  Stacy Martinez comes in today for post-procedure evaluation after the treatment done on 10/21/2017.  Further details on both, my assessment(s), as well as the proposed treatment plan, please see below.  Post-Procedure Assessment  10/21/2017 Procedure: Bilateral L3, 4, L5 facet medial branch nerve block Pre-procedure pain score:  6/10 Post-procedure pain score: 0/10         Influential Factors: BMI: 43.90 kg/m Intra-procedural challenges: None observed.         Assessment challenges: None detected.              Reported side-effects: None.        Post-procedural adverse reactions or complications: None reported         Sedation: Please see nurses note. When no sedatives are used, the analgesic levels obtained are directly associated to the effectiveness of the local anesthetics. However, when sedation is provided, the level of analgesia obtained during the initial 1 hour following the intervention, is believed to be the result of a combination of factors. These factors may include, but are not limited to: 1. The effectiveness of the local anesthetics used. 2. The effects of the analgesic(s) and/or anxiolytic(s) used. 3. The degree of discomfort experienced by the patient at the time of the procedure. 4. The patients ability and reliability in recalling and recording the events. 5. The presence and influence of possible secondary gains and/or psychosocial factors. Reported result: Relief experienced during the 1st hour after the procedure: 100 % (Ultra-Short Term Relief)            Interpretative annotation: Clinically appropriate result. Analgesia  during this period is likely to be Local Anesthetic and/or IV Sedative (Analgesic/Anxiolytic) related.          Effects of local anesthetic: The analgesic effects attained during this period are directly associated to the localized infiltration of local anesthetics and  therefore cary significant diagnostic value as to the etiological location, or anatomical origin, of the pain. Expected duration of relief is directly dependent on the pharmacodynamics of the local anesthetic used. Long-acting (4-6 hours) anesthetics used.  Reported result: Relief during the next 4 to 6 hour after the procedure: 100 % (Short-Term Relief)            Interpretative annotation: Clinically appropriate result. Analgesia during this period is likely to be Local Anesthetic-related.          Long-term benefit: Defined as the period of time past the expected duration of local anesthetics (1 hour for short-acting and 4-6 hours for long-acting). With the possible exception of prolonged sympathetic blockade from the local anesthetics, benefits during this period are typically attributed to, or associated with, other factors such as analgesic sensory neuropraxia, antiinflammatory effects, or beneficial biochemical changes provided by agents other than the local anesthetics.  Reported result: Extended relief following procedure: (100% relief for approx 10 days until pt started Januvia which she says has been causing her back pain) (Long-Term Relief)            Interpretative annotation: Clinically appropriate result. Good relief. No permanent benefit expected. Inflammation plays a part in the etiology to the pain.          Current benefits: Defined as reported results that persistent at this point in time.   Analgesia: 75-100 % Stacy Martinez reports that both, extremity and the axial pain improved with the treatment. Function: Somewhat improved ROM: Somewhat improved Interpretative annotation: Ongoing benefit. No permanent benefit expected. Effective diagnostic intervention.          Interpretation: Results would suggest a successful diagnostic and therapeutic intervention.                  Plan:  Please see "Plan of Care" for details.                Laboratory Chemistry  Inflammation Markers (CRP:  Acute Phase) (ESR: Chronic Phase) Lab Results  Component Value Date   LATICACIDVEN 1.72 01/02/2014                         Rheumatology Markers No results found for: Elayne Guerin, [facility]                      Renal Function Markers Lab Results  Component Value Date   BUN 17 11/03/2017   CREATININE 0.76 11/03/2017   GFRAA 98 11/03/2017   GFRNONAA 85 11/03/2017                              Hepatic Function Markers Lab Results  Component Value Date   AST 14 11/03/2017   ALT 16 11/03/2017   ALBUMIN 4.1 04/08/2017   ALKPHOS 119 04/08/2017   LIPASE 19 12/18/2013                        Electrolytes Lab Results  Component Value Date   NA 136 11/03/2017   K 4.4 11/03/2017   CL 101 11/03/2017  CALCIUM 9.3 11/03/2017                        Neuropathy Markers Lab Results  Component Value Date   HGBA1C 9.2 11/03/2017                        Bone Pathology Markers No results found for: VD25OH, [id number], TX7741SE3, TR3202BX4, 25OHVITD1, 25OHVITD2, 25OHVITD3, TESTOFREE, TESTOSTERONE                       Coagulation Parameters Lab Results  Component Value Date   PLT 533 (H) 06/19/2016                        Cardiovascular Markers Lab Results  Component Value Date   TROPONINI <0.03 03/17/2016   HGB 13.0 06/19/2016   HCT 40.0 06/19/2016                         CA Markers No results found for: CEA, CA125, LABCA2                      Note: Lab results reviewed.  Recent Diagnostic Imaging Results  DG C-Arm 1-60 Min-No Report Fluoroscopy was utilized by the requesting physician.  No radiographic  interpretation.   Complexity Note: Imaging results reviewed. Results shared with Stacy Martinez, using Layman's terms.                         Meds   Current Outpatient Medications:  .  amLODipine (NORVASC) 10 MG tablet, TAKE 1 TABLET BY MOUTH ONCE DAILY, Disp: 90 tablet, Rfl: 3 .  carvedilol (COREG) 25 MG tablet, Take 1 tablet (25 mg total)  by mouth 2 (two) times daily with a meal., Disp: 60 tablet, Rfl: 0 .  empagliflozin (JARDIANCE) 25 MG TABS tablet, Take 25 mg by mouth daily., Disp: , Rfl:  .  enalapril (VASOTEC) 20 MG tablet, Take 1 tablet (20 mg total) by mouth 2 (two) times daily., Disp: 60 tablet, Rfl: 11 .  furosemide (LASIX) 40 MG tablet, Take 2 tablets (80 mg total) by mouth daily. (Patient taking differently: Take 40 mg by mouth daily. ), Disp: 60 tablet, Rfl: 0 .  glucose blood (RELION GLUCOSE TEST STRIPS) test strip, Use as instructed, Disp: 100 each, Rfl: 12 .  metformin (FORTAMET) 500 MG (OSM) 24 hr tablet, Take 1 tablet (500 mg total) by mouth 2 (two) times daily with a meal., Disp: 180 tablet, Rfl: 1 .  ReliOn Ultra Thin Lancets MISC, 1 each by Does not apply route daily., Disp: 100 each, Rfl: 11 .  rosuvastatin (CRESTOR) 5 MG tablet, Take 1 tablet (5 mg total) by mouth daily., Disp: 90 tablet, Rfl: 3 .  sitaGLIPtin (JANUVIA) 100 MG tablet, Take 1 tablet (100 mg total) by mouth daily., Disp: 90 tablet, Rfl: 1 .  tiZANidine (ZANAFLEX) 4 MG capsule, Take 1 capsule (4 mg total) by mouth 2 (two) times daily as needed for muscle spasms., Disp: 60 capsule, Rfl: 4 .  diclofenac (VOLTAREN) 75 MG EC tablet, Take 1 tablet (75 mg total) by mouth 2 (two) times daily., Disp: 60 tablet, Rfl: 0  Current Facility-Administered Medications:  .  bupivacaine (PF) (MARCAINE) 0.25 % injection 30 mL, 30 mL, Other, Once, Mohammed Kindle, MD .  ceFAZolin (ANCEF) IVPB 1 g/50  mL premix, 1 g, Intravenous, Once, Mohammed Kindle, MD .  fentaNYL (SUBLIMAZE) injection 100 mcg, 100 mcg, Intravenous, Once, Mohammed Kindle, MD .  lactated ringers infusion 1,000 mL, 1,000 mL, Intravenous, Continuous, Mohammed Kindle, MD, Last Rate: 125 mL/hr at 01/30/16 0933, 1,000 mL at 01/30/16 0933 .  lactated ringers infusion 1,000 mL, 1,000 mL, Intravenous, Continuous, Mohammed Kindle, MD, Last Rate: 125 mL/hr at 03/10/16 1100, 1,000 mL at 03/10/16 1100 .  lactated  ringers infusion 1,000 mL, 1,000 mL, Intravenous, Continuous, Mohammed Kindle, MD .  lidocaine (PF) (XYLOCAINE) 1 % injection 10 mL, 10 mL, Subcutaneous, Once, Mohammed Kindle, MD .  midazolam (VERSED) 5 MG/5ML injection 5 mg, 5 mg, Intravenous, Once, Mohammed Kindle, MD .  orphenadrine (NORFLEX) injection 60 mg, 60 mg, Intramuscular, Once, Mohammed Kindle, MD .  sodium chloride flush (NS) 0.9 % injection 20 mL, 20 mL, Other, Once, Mohammed Kindle, MD .  triamcinolone acetonide (KENALOG-40) injection 40 mg, 40 mg, Other, Once, Mohammed Kindle, MD  ROS  Constitutional: Denies any fever or chills Gastrointestinal: No reported hemesis, hematochezia, vomiting, or acute GI distress Musculoskeletal: Denies any acute onset joint swelling, redness, loss of ROM, or weakness Neurological: No reported episodes of acute onset apraxia, aphasia, dysarthria, agnosia, amnesia, paralysis, loss of coordination, or loss of consciousness  Allergies  Stacy Martinez is allergic to prednisone.  PFSH  Drug: Stacy Martinez  reports that she does not use drugs. Alcohol:  reports that she does not drink alcohol. Tobacco:  reports that she has never smoked. She has never used smokeless tobacco. Medical:  has a past medical history of Allergy, Arthritis, Diabetes mellitus without complication ([place]), Enlarged heart, and Hypertension. Surgical: Stacy Martinez  has a past surgical history that includes Colon surgery; Hernia repair; and Wrist fracture surgery (Right). Family: family history includes Cancer in her father; Diabetes in her mother; Mental illness in her son.  Constitutional Exam  General appearance: Well nourished, well developed, and well hydrated. In no apparent acute distress Vitals:   12/02/17 0847  BP: 128/63  Pulse: 73  Resp: 16  Temp: 97.7 F (36.5 C)  TempSrc: Oral  SpO2: 100%  Weight: 240 lb (108.9 kg)  Height: '5\' 2"'$  (1.575 m)   BMI Assessment: Estimated body mass index is 43.9 kg/m as calculated from the  following:   Height as of this encounter: '5\' 2"'$  (1.575 m).   Weight as of this encounter: 240 lb (108.9 kg).  BMI interpretation table: BMI level Category Range association with higher incidence of chronic pain  <18 kg/m2 Underweight   18.5-24.9 kg/m2 Ideal body weight   25-29.9 kg/m2 Overweight Increased incidence by 20%  30-34.9 kg/m2 Obese (Class I) Increased incidence by 68%  35-39.9 kg/m2 Severe obesity (Class II) Increased incidence by 136%  >40 kg/m2 Extreme obesity (Class III) Increased incidence by 254%   BMI Readings from Last 4 Encounters:  12/02/17 43.90 kg/m  11/03/17 44.37 kg/m  10/21/17 43.90 kg/m  10/06/17 43.90 kg/m   Wt Readings from Last 4 Encounters:  12/02/17 240 lb (108.9 kg)  11/03/17 242 lb 9.6 oz (110 kg)  10/21/17 240 lb (108.9 kg)  10/06/17 240 lb (108.9 kg)  Psych/Mental status: Alert, oriented x 3 (person, place, & time)       Eyes: PERLA Respiratory: No evidence of acute respiratory distress  Cervical Spine Area Exam  Skin & Axial Inspection: No masses, redness, edema, swelling, or associated skin lesions Alignment: Symmetrical Functional ROM: Unrestricted ROM  Stability: No instability detected Muscle Tone/Strength: Functionally intact. No obvious neuro-muscular anomalies detected. Sensory (Neurological): Unimpaired Palpation: No palpable anomalies              Upper Extremity (UE) Exam    Side: Right upper extremity  Side: Left upper extremity  Skin & Extremity Inspection: Skin color, temperature, and hair growth are WNL. No peripheral edema or cyanosis. No masses, redness, swelling, asymmetry, or associated skin lesions. No contractures.  Skin & Extremity Inspection: Skin color, temperature, and hair growth are WNL. No peripheral edema or cyanosis. No masses, redness, swelling, asymmetry, or associated skin lesions. No contractures.  Functional ROM: Unrestricted ROM          Functional ROM: Unrestricted ROM          Muscle  Tone/Strength: Functionally intact. No obvious neuro-muscular anomalies detected.  Muscle Tone/Strength: Functionally intact. No obvious neuro-muscular anomalies detected.  Sensory (Neurological): Unimpaired          Sensory (Neurological): Unimpaired          Palpation: No palpable anomalies              Palpation: No palpable anomalies              Specialized Test(s): Deferred         Specialized Test(s): Deferred          Thoracic Spine Area Exam  Skin & Axial Inspection: No masses, redness, or swelling Alignment: Symmetrical Functional ROM: Unrestricted ROM Stability: No instability detected Muscle Tone/Strength: Functionally intact. No obvious neuro-muscular anomalies detected. Sensory (Neurological): Unimpaired Muscle strength & Tone: No palpable anomalies  Lumbar Spine Area Exam  Skin & Axial Inspection: No masses, redness, or swelling Alignment: Symmetrical Functional ROM: Unrestricted ROM      Stability: No instability detected Muscle Tone/Strength: Functionally intact. No obvious neuro-muscular anomalies detected. Sensory (Neurological): Unimpaired Palpation: No palpable anomalies       Provocative Tests: Lumbar Hyperextension and rotation test: Positive bilaterally for facet joint pain.however improved after treatment Lumbar Lateral bending test: evaluation deferred today       Patrick's Maneuver: evaluation deferred today                    Gait & Posture Assessment  Ambulation: Unassisted Gait: Relatively normal for age and body habitus Posture: WNL   Lower Extremity Exam    Side: Right lower extremity  Side: Left lower extremity  Skin & Extremity Inspection: Skin color, temperature, and hair growth are WNL. No peripheral edema or cyanosis. No masses, redness, swelling, asymmetry, or associated skin lesions. No contractures.  Skin & Extremity Inspection: Skin color, temperature, and hair growth are WNL. No peripheral edema or cyanosis. No masses, redness, swelling,  asymmetry, or associated skin lesions. No contractures.  Functional ROM: Unrestricted ROM          Functional ROM: Unrestricted ROM          Muscle Tone/Strength: Functionally intact. No obvious neuro-muscular anomalies detected.  Muscle Tone/Strength: Functionally intact. No obvious neuro-muscular anomalies detected.  Sensory (Neurological): Unimpaired  Sensory (Neurological): Unimpaired  Palpation: No palpable anomalies  Palpation: No palpable anomalies   Assessment  Primary Diagnosis & Pertinent Problem List: The primary encounter diagnosis was Spondylosis without myelopathy or radiculopathy, lumbar region. Diagnoses of Facet syndrome, lumbar, Lumbar spondylosis, DDD (degenerative disc disease), lumbar, Diabetic mononeuropathy associated with diabetes mellitus due to underlying condition ([place]), and Arthritis were also pertinent to this visit.  Status Diagnosis  Responding Responding  Responding 1. Spondylosis without myelopathy or radiculopathy, lumbar region   2. Facet syndrome, lumbar   3. Lumbar spondylosis   4. DDD (degenerative disc disease), lumbar   5. Diabetic mononeuropathy associated with diabetes mellitus due to underlying condition ([place])   6. Arthritis      General Recommendations: The pain condition that the patient suffers from is best treated with a multidisciplinary approach that involves an increase in physical activity to prevent de-conditioning and worsening of the pain cycle, as well as psychological counseling (formal and/or informal) to address the co-morbid psychological affects of pain. Treatment will often involve judicious use of pain medications and interventional procedures to decrease the pain, allowing the patient to participate in the physical activity that will ultimately produce long-lasting pain reductions. The goal of the multidisciplinary approach is to return the patient to a higher level of overall function and to restore their ability to perform  activities of daily living.  62 year old female with a past medical history of cardiomyopathy, hypertension, hyperlipidemia with a history of chronic low back pain secondary to lumbar spondylosis without myelopathy, lumbar spinal stenosis, degenerative joint disease of bilateral knees and SI joint arthralgia. Patient is status post bilateral lumbar facet blocks on 10/21/2017 is continuing to have ongoing benefit.  She is able to perform activities of daily living, stand up erect for greater period of time as well as ambulate for greater period of time without having as much pain.  Patient is endorsing bilateral knee pain.  I will have her trial diclofenac 75 mg twice daily as needed after meal.  Also place as needed order for lumbar facet medial branch nerve block to repeat bilaterally.  Future considerations: Radiofrequency ablation.   Plan of Care  Pharmacotherapy (Medications Ordered): Meds ordered this encounter  Medications  . diclofenac (VOLTAREN) 75 MG EC tablet    Sig: Take 1 tablet (75 mg total) by mouth 2 (two) times daily.    Dispense:  60 tablet    Refill:  0   Lab-work, procedure(s), and/or referral(s): Orders Placed This Encounter  Procedures  . LUMBAR FACET(MEDIAL BRANCH NERVE BLOCK) MBNB    Provider-requested follow-up: Return in about 6 weeks (around 01/13/2018), or if symptoms worsen or fail to improve, for Medication Management. Time Note: Greater than 50% of the 25 minute(s) of face-to-face time spent with Stacy Martinez, was spent in counseling/coordination of care regarding: medication side effects, the results, interpretation and significance of  her recent diagnostic interventional treatment(s), realistic expectations, the goals of pain management (increased in functionality) and the need to bring and keep the BMI below 30. Future Appointments  Date Time Provider [place]  01/12/2018  9:45 AM Gillis Santa, MD [facility] None  02/05/2018  9:20 AM Mikey College, NP [facility] None    Primary Care Physician: Mikey College, NP Location: [facility] Note by: Gillis Santa, M.D Date: 12/02/2017; Time: 9:53 AM  Patient Instructions  ____________________________________________________________________________________________  Preparing for Procedure with Sedation  Instructions: . Oral Intake: Do not eat or drink anything for at least 8 hours prior to your procedure. . Transportation: Public transportation is not allowed. Bring an adult driver. The driver must be physically present in our waiting room before any procedure can be started. Marland Kitchen Physical Assistance: Bring an adult physically capable of assisting you, in the event you need help. This adult should keep you company at home for at least 6 hours after the procedure. . Blood Pressure Medicine: Take your blood  pressure medicine with a sip of water the morning of the procedure. . Blood thinners:  . Diabetics on insulin: Notify the staff so that you can be scheduled 1st case in the morning. If your diabetes requires high dose insulin, take only  of your normal insulin dose the morning of the procedure and notify the staff that you have done so. . Preventing infections: Shower with an antibacterial soap the morning of your procedure. . Build-up your immune system: Take 1000 mg of Vitamin C with every meal (3 times a day) the day prior to your procedure. Marland Kitchen Antibiotics: Inform the staff if you have a condition or reason that requires you to take antibiotics before dental procedures. . Pregnancy: If you are pregnant, call and cancel the procedure. . Sickness: If you have a cold, fever, or any active infections, call and cancel the procedure. . Arrival: You must be in the facility at least 30 minutes prior to your scheduled procedure. . Children: Do not bring children with you. . Dress appropriately: Bring dark clothing that you would not mind if they get  stained. . Valuables: Do not bring any jewelry or valuables.  Procedure appointments are reserved for interventional treatments only. Marland Kitchen No Prescription Refills. . No medication changes will be discussed during procedure appointments. . No disability issues will be discussed.  Remember:  Regular Business hours are:  Monday to Thursday 8:00 AM to 4:00 PM  Provider's Schedule: Milinda Pointer, MD:  Procedure days: Tuesday and Thursday 7:30 AM to 4:00 PM  Gillis Santa, MD:  Procedure days: Monday and Wednesday 7:30 AM to 4:00 PM ____________________________________________________________________________________________

## 2017-12-02 NOTE — Patient Instructions (Signed)
____________________________________________________________________________________________  Preparing for Procedure with Sedation  Instructions: . Oral Intake: Do not eat or drink anything for at least 8 hours prior to your procedure. . Transportation: Public transportation is not allowed. Bring an adult driver. The driver must be physically present in our waiting room before any procedure can be started. . Physical Assistance: Bring an adult physically capable of assisting you, in the event you need help. This adult should keep you company at home for at least 6 hours after the procedure. . Blood Pressure Medicine: Take your blood pressure medicine with a sip of water the morning of the procedure. . Blood thinners:  . Diabetics on insulin: Notify the staff so that you can be scheduled 1st case in the morning. If your diabetes requires high dose insulin, take only  of your normal insulin dose the morning of the procedure and notify the staff that you have done so. . Preventing infections: Shower with an antibacterial soap the morning of your procedure. . Build-up your immune system: Take 1000 mg of Vitamin C with every meal (3 times a day) the day prior to your procedure. . Antibiotics: Inform the staff if you have a condition or reason that requires you to take antibiotics before dental procedures. . Pregnancy: If you are pregnant, call and cancel the procedure. . Sickness: If you have a cold, fever, or any active infections, call and cancel the procedure. . Arrival: You must be in the facility at least 30 minutes prior to your scheduled procedure. . Children: Do not bring children with you. . Dress appropriately: Bring dark clothing that you would not mind if they get stained. . Valuables: Do not bring any jewelry or valuables.  Procedure appointments are reserved for interventional treatments only. . No Prescription Refills. . No medication changes will be discussed during procedure  appointments. . No disability issues will be discussed.  Remember:  Regular Business hours are:  Monday to Thursday 8:00 AM to 4:00 PM  Provider's Schedule: Francisco Naveira, MD:  Procedure days: Tuesday and Thursday 7:30 AM to 4:00 PM  Bilal Lateef, MD:  Procedure days: Monday and Wednesday 7:30 AM to 4:00 PM ____________________________________________________________________________________________    

## 2017-12-02 NOTE — Progress Notes (Signed)
Safety precautions to be maintained throughout the outpatient stay will include: orient to surroundings, keep bed in low position, maintain call bell within reach at all times, provide assistance with transfer out of bed and ambulation.  

## 2018-01-06 ENCOUNTER — Ambulatory Visit
Admission: RE | Admit: 2018-01-06 | Discharge: 2018-01-06 | Disposition: A | Discharge status: Home / Self Care | Payer: BLUE CROSS/BLUE SHIELD | Source: Ambulatory Visit | Attending: Student in an Organized Health Care Education/Training Program | Admitting: Student in an Organized Health Care Education/Training Program

## 2018-01-06 ENCOUNTER — Other Ambulatory Visit: Payer: Self-pay

## 2018-01-06 ENCOUNTER — Ambulatory Visit (HOSPITAL_BASED_OUTPATIENT_CLINIC_OR_DEPARTMENT_OTHER): Payer: BLUE CROSS/BLUE SHIELD | Admitting: Student in an Organized Health Care Education/Training Program

## 2018-01-06 ENCOUNTER — Encounter: Payer: Self-pay | Admitting: Student in an Organized Health Care Education/Training Program

## 2018-01-06 VITALS — BP 134/67 | HR 74 | Temp 97.8°F | Resp 10 | Ht 62.0 in | Wt 240.0 lb

## 2018-01-06 DIAGNOSIS — M47816 Spondylosis without myelopathy or radiculopathy, lumbar region: Secondary | ICD-10-CM | POA: Diagnosis not present

## 2018-01-06 DIAGNOSIS — M549 Dorsalgia, unspecified: Secondary | ICD-10-CM | POA: Insufficient documentation

## 2018-01-06 DIAGNOSIS — Z888 Allergy status to other drugs, medicaments and biological substances status: Secondary | ICD-10-CM | POA: Insufficient documentation

## 2018-01-06 MED ORDER — LIDOCAINE HCL (PF) 1 % IJ SOLN
INTRAMUSCULAR | Status: AC
  Filled 2018-01-06: qty 5

## 2018-01-06 MED ORDER — LIDOCAINE HCL (PF) 1 % IJ SOLN
10.0000 mL | Freq: Once | INTRAMUSCULAR | Status: AC
  Administered 2018-01-06: 5 mL

## 2018-01-06 MED ORDER — FENTANYL CITRATE (PF) 100 MCG/2ML IJ SOLN: 25.0000 ug | INTRAMUSCULAR | Status: DC | PRN

## 2018-01-06 MED ORDER — DEXAMETHASONE SODIUM PHOSPHATE 10 MG/ML IJ SOLN
INTRAMUSCULAR | Status: AC
  Filled 2018-01-06: qty 1

## 2018-01-06 MED ORDER — LACTATED RINGERS IV SOLN
1000.0000 mL | Freq: Once | INTRAVENOUS | Status: AC
  Administered 2018-01-06: 1000 mL via INTRAVENOUS

## 2018-01-06 MED ORDER — FENTANYL CITRATE (PF) 100 MCG/2ML IJ SOLN
INTRAMUSCULAR | Status: AC
  Filled 2018-01-06: qty 2

## 2018-01-06 MED ORDER — DEXAMETHASONE SODIUM PHOSPHATE 10 MG/ML IJ SOLN
10.0000 mg | Freq: Once | INTRAMUSCULAR | Status: AC
  Administered 2018-01-06: 10 mg

## 2018-01-06 MED ORDER — ROPIVACAINE HCL 2 MG/ML IJ SOLN
INTRAMUSCULAR | Status: AC
  Filled 2018-01-06: qty 10

## 2018-01-06 MED ORDER — ROPIVACAINE HCL 2 MG/ML IJ SOLN
10.0000 mL | Freq: Once | INTRAMUSCULAR | Status: AC
  Administered 2018-01-06: 10 mL

## 2018-01-06 NOTE — Progress Notes (Signed)
Safety precautions to be maintained throughout the outpatient stay will include: orient to surroundings, keep bed in low position, maintain call bell within reach at all times, provide assistance with transfer out of bed and ambulation.  

## 2018-01-06 NOTE — Patient Instructions (Signed)

## 2018-01-06 NOTE — Progress Notes (Signed)
Patient's Name: Stacy Martinez  MRN: [id number]  Referring Provider: Gillis Santa, MD  DOB: June 25, 1956  PCP: Mikey College, NP  DOS: 01/06/2018  Note by: Gillis Santa, MD  Service setting: Ambulatory outpatient  Specialty: Interventional Pain Management  Patient type: Established  Location: [facility] (AMB) Pain Management Facility  Visit type: Interventional Procedure   Primary Reason for Visit: Interventional Pain Management Treatment. CC: Procedure (Bilateral Lumbar ) and Back Pain  Procedure:       Anesthesia, Analgesia, Anxiolysis:  Type: Lumbar Facet, Medial Branch Block(s) #5  Primary Purpose: Therapeutic Region: Posterolateral Lumbosacral Spine Level: L2, L3, L4, L5,Medial Branch Level(s). Injecting these levels blocks the L3-4, L4-5, and L5-S1 lumbar facet joints. Laterality: Bilateral  Type: Moderate (Conscious) Sedation combined with Local Anesthesia Indication(s): Analgesia and Anxiety Route: Intravenous (IV) IV Access: Secured Sedation: Meaningful verbal contact was maintained at all times during the procedure  Local Anesthetic: Lidocaine 1%   Indications: 1. Spondylosis without myelopathy or radiculopathy, lumbar region   2. Facet syndrome, lumbar   3. Lumbar spondylosis    Pain Score: Pre-procedure: 10-Worst pain ever/10 Post-procedure: 3 /10  Pre-op Assessment:  Ms. Tesch is a 62 y.o. (year old), female patient, seen today for interventional treatment. She  has a past surgical history that includes Colon surgery; Hernia repair; and Wrist fracture surgery (Right). Ms. Bazar has a current medication list which includes the following prescription(s): amlodipine, carvedilol, empagliflozin, enalapril, furosemide, glucose blood, metformin, relion ultra thin lancets, rosuvastatin, sitagliptin, and tizanidine, and the following Facility-Administered Medications: bupivacaine (pf), cefazolin, fentanyl, fentanyl, lactated ringers, lactated ringers, lactated ringers, lidocaine (pf),  midazolam, orphenadrine, sodium chloride flush, and triamcinolone acetonide. Her primarily concern today is the Procedure (Bilateral Lumbar ) and Back Pain  Initial Vital Signs:  Pulse/HCG Rate: 74ECG Heart Rate: 73 Temp: 97.8 F (36.6 C) Resp: 18 BP: 135/62 SpO2: 97 %  BMI: Estimated body mass index is 43.9 kg/m as calculated from the following:   Height as of this encounter: 5\' 2"  (1.575 m).   Weight as of this encounter: 240 lb (108.9 kg).  Risk Assessment: Allergies: Reviewed. She is allergic to prednisone.  Allergy Precautions: None required Coagulopathies: Reviewed. None identified.  Blood-thinner therapy: None at this time Active Infection(s): Reviewed. None identified. Ms. Langhorst is afebrile  Site Confirmation: Ms. Hoopes was asked to confirm the procedure and laterality before marking the site Procedure checklist: Completed Consent: Before the procedure and under the influence of no sedative(s), amnesic(s), or anxiolytics, the patient was informed of the treatment options, risks and possible complications. To fulfill our ethical and legal obligations, as recommended by the American Medical Association's Code of Ethics, I have informed the patient of my clinical impression; the nature and purpose of the treatment or procedure; the risks, benefits, and possible complications of the intervention; the alternatives, including doing nothing; the risk(s) and benefit(s) of the alternative treatment(s) or procedure(s); and the risk(s) and benefit(s) of doing nothing. The patient was provided information about the general risks and possible complications associated with the procedure. These may include, but are not limited to: failure to achieve desired goals, infection, bleeding, organ or nerve damage, allergic reactions, paralysis, and death. In addition, the patient was informed of those risks and complications associated to Spine-related procedures, such as failure to decrease pain;  infection (i.e.: Meningitis, epidural or intraspinal abscess); bleeding (i.e.: epidural hematoma, subarachnoid hemorrhage, or any other type of intraspinal or peri-dural bleeding); organ or nerve damage (i.e.: Any type of peripheral nerve,  nerve root, or spinal cord injury) with subsequent damage to sensory, motor, and/or autonomic systems, resulting in permanent pain, numbness, and/or weakness of one or several areas of the body; allergic reactions; (i.e.: anaphylactic reaction); and/or death. Furthermore, the patient was informed of those risks and complications associated with the medications. These include, but are not limited to: allergic reactions (i.e.: anaphylactic or anaphylactoid reaction(s)); adrenal axis suppression; blood sugar elevation that in diabetics may result in ketoacidosis or comma; water retention that in patients with history of congestive heart failure may result in shortness of breath, pulmonary edema, and decompensation with resultant heart failure; weight gain; swelling or edema; medication-induced neural toxicity; particulate matter embolism and blood vessel occlusion with resultant organ, and/or nervous system infarction; and/or aseptic necrosis of one or more joints. Finally, the patient was informed that Medicine is not an exact science; therefore, there is also the possibility of unforeseen or unpredictable risks and/or possible complications that may result in a catastrophic outcome. The patient indicated having understood very clearly. We have given the patient no guarantees and we have made no promises. Enough time was given to the patient to ask questions, all of which were answered to the patient's satisfaction. Ms. Bossard has indicated that she wanted to continue with the procedure. Attestation: I, the ordering provider, attest that I have discussed with the patient the benefits, risks, side-effects, alternatives, likelihood of achieving goals, and potential problems during  recovery for the procedure that I have provided informed consent. Date  Time: 01/06/2018 11:11 AM  Pre-Procedure Preparation:  Monitoring: As per clinic protocol. Respiration, ETCO2, SpO2, BP, heart rate and rhythm monitor placed and checked for adequate function Safety Precautions: Patient was assessed for positional comfort and pressure points before starting the procedure. Time-out: I initiated and conducted the "Time-out" before starting the procedure, as per protocol. The patient was asked to participate by confirming the accuracy of the "Time Out" information. Verification of the correct person, site, and procedure were performed and confirmed by me, the nursing staff, and the patient. "Time-out" conducted as per Joint Commission's Universal Protocol (UP.01.01.01). Time: 1154  Description of Procedure:       Position: Prone Laterality: Bilateral. The procedure was performed in identical fashion on both sides. Levels:  L2, L3, L4, L5, Medial Branch Level(s) Area Prepped: Posterior Lumbosacral Region Prepping solution: ChloraPrep (2% chlorhexidine gluconate and 70% isopropyl alcohol) Safety Precautions: Aspiration looking for blood return was conducted prior to all injections. At no point did we inject any substances, as a needle was being advanced. Before injecting, the patient was told to immediately notify me if she was experiencing any new onset of "ringing in the ears, or metallic taste in the mouth". No attempts were made at seeking any paresthesias. Safe injection practices and needle disposal techniques used. Medications properly checked for expiration dates. SDV (single dose vial) medications used. After the completion of the procedure, all disposable equipment used was discarded in the proper designated medical waste containers. Local Anesthesia: Protocol guidelines were followed. The patient was positioned over the fluoroscopy table. The area was prepped in the usual manner. The  time-out was completed. The target area was identified using fluoroscopy. A 12-in long, straight, sterile hemostat was used with fluoroscopic guidance to locate the targets for each level blocked. Once located, the skin was marked with an approved surgical skin marker. Once all sites were marked, the skin (epidermis, dermis, and hypodermis), as well as deeper tissues (fat, connective tissue and muscle) were infiltrated with  a small amount of a short-acting local anesthetic, loaded on a 10cc syringe with a 25G, 1.5-in  Needle. An appropriate amount of time was allowed for local anesthetics to take effect before proceeding to the next step. Local Anesthetic: Lidocaine 1.0% The unused portion of the local anesthetic was discarded in the proper designated containers. Technical explanation of process:  L2 Medial Branch Nerve Block (MBB): The target area for the L2 medial branch is at the junction of the postero-lateral aspect of the superior articular process and the superior, posterior, and medial edge of the transverse process of L3. Under fluoroscopic guidance, a Quincke needle was inserted until contact was made with os over the superior postero-lateral aspect of the pedicular shadow (target area). After negative aspiration for blood, 1 mL of the nerve block solution was injected without difficulty or complication. The needle was removed intact. L3 Medial Branch Nerve Block (MBB): The target area for the L3 medial branch is at the junction of the postero-lateral aspect of the superior articular process and the superior, posterior, and medial edge of the transverse process of L4. Under fluoroscopic guidance, a Quincke needle was inserted until contact was made with os over the superior postero-lateral aspect of the pedicular shadow (target area). After negative aspiration for blood, 58mL of the nerve block solution was injected without difficulty or complication. The needle was removed intact. L4 Medial Branch  Nerve Block (MBB): The target area for the L4 medial branch is at the junction of the postero-lateral aspect of the superior articular process and the superior, posterior, and medial edge of the transverse process of L5. Under fluoroscopic guidance, a Quincke needle was inserted until contact was made with os over the superior postero-lateral aspect of the pedicular shadow (target area). After negative aspiration for blood, 1 mL of the nerve block solution was injected without difficulty or complication. The needle was removed intact. L5 Medial Branch Nerve Block (MBB): The target area for the L5 medial branch is at the junction of the postero-lateral aspect of the superior articular process and the superior, posterior, and medial edge of the sacral ala. Under fluoroscopic guidance, a Quincke needle was inserted until contact was made with os over the superior postero-lateral aspect of the pedicular shadow (target area). After negative aspiration for blood,1  mL of the nerve block solution was injected without difficulty or complication. The needle was removed intact.  Procedural Needles: 22-gauge, 3.5-inch, Quincke needles used for all levels. Nerve block solution: 10 cc solution made of 9 cc of 0.2% ropivacaine, 1 cc of Decadron 10 mg/cc.  1 to 1.5 cc injected at each level bilaterally.  The unused portion of the solution was discarded in the proper designated containers.  Once the entire procedure was completed, the treated area was cleaned, making sure to leave some of the prepping solution back to take advantage of its long term bactericidal properties.   Illustration of the posterior view of the lumbar spine and the posterior neural structures. Laminae of L2 through S1 are labeled. DPRL5, dorsal primary ramus of L5; DPRS1, dorsal primary ramus of S1; DPR3, dorsal primary ramus of L3; FJ, facet (zygapophyseal) joint L3-L4; I, inferior articular process of L4; LB1, lateral branch of dorsal primary ramus  of L1; IAB, inferior articular branches from L3 medial branch (supplies L4-L5 facet joint); IBP, intermediate branch plexus; MB3, medial branch of dorsal primary ramus of L3; NR3, third lumbar nerve root; S, superior articular process of L5; SAB, superior articular branches from L4 (  supplies L4-5 facet joint also); TP3, transverse process of L3.  Vitals:   01/06/18 1219 01/06/18 1229 01/06/18 1239 01/06/18 1249  BP: 122/82 (!) 143/70 (!) 142/74 134/67  Pulse:      Resp: 12 13 19 10   Temp:      TempSrc:      SpO2: 96% 97% 97% 98%  Weight:      Height:        Start Time: 1154 hrs. End Time: 1219 hrs.  Imaging Guidance (Spinal):  Type of Imaging Technique: Fluoroscopy Guidance (Spinal) Indication(s): Assistance in needle guidance and placement for procedures requiring needle placement in or near specific anatomical locations not easily accessible without such assistance. Exposure Time: Please see nurses notes. Contrast: None used. Fluoroscopic Guidance: I was personally present during the use of fluoroscopy. "Tunnel Vision Technique" used to obtain the best possible view of the target area. Parallax error corrected before commencing the procedure. "Direction-depth-direction" technique used to introduce the needle under continuous pulsed fluoroscopy. Once target was reached, antero-posterior, oblique, and lateral fluoroscopic projection used confirm needle placement in all planes. Images permanently stored in EMR. Interpretation: No contrast injected. I personally interpreted the imaging intraoperatively. Adequate needle placement confirmed in multiple planes. Permanent images saved into the patient's record.  Antibiotic Prophylaxis:   Anti-infectives (From admission, onward)   None     Indication(s): None identified  Post-operative Assessment:  Post-procedure Vital Signs:  Pulse/HCG Rate: 7465 Temp: 97.8 F (36.6 C) Resp: 10 BP: 134/67 SpO2: 98 %  EBL: None  Complications: No  immediate post-treatment complications observed by team, or reported by patient.  Note: The patient tolerated the entire procedure well. A repeat set of vitals were taken after the procedure and the patient was kept under observation following institutional policy, for this type of procedure. Post-procedural neurological assessment was performed, showing return to baseline, prior to discharge. The patient was provided with post-procedure discharge instructions, including a section on how to identify potential problems. Should any problems arise concerning this procedure, the patient was given instructions to immediately contact us, at any time, without hesitation. In any case, we plan to contact the patient by telephone for a follow-up status report regarding this interventional procedure.  Comments:  No additional relevant information. 5 out of 5 strength bilateral lower extremity: Plantar flexion, dorsiflexion, knee flexion, knee extension.  Plan of Care   Imaging Orders     DG C-Arm 1-60 Min-No Report oh Procedure Orders    No procedure(s) ordered today    Medications ordered for procedure: Meds ordered this encounter  Medications  . lactated ringers infusion 1,000 mL  . fentaNYL (SUBLIMAZE) injection 25-100 mcg    Make sure Narcan is available in the pyxis when using this medication. In the event of respiratory depression (RR< 8/min): Titrate NARCAN (naloxone) in increments of 0.1 to 0.2 mg IV at 2-3 minute intervals, until desired degree of reversal.  . lidocaine (PF) (XYLOCAINE) 1 % injection 10 mL  . ropivacaine (PF) 2 mg/mL (0.2%) (NAROPIN) injection 10 mL  . dexamethasone (DECADRON) injection 10 mg   Medications administered: We administered lactated ringers, lidocaine (PF), ropivacaine (PF) 2 mg/mL (0.2%), and dexamethasone.  See the medical record for exact dosing, route, and time of administration.  New Prescriptions   No medications on file   Disposition: Discharge home    Discharge Date & Time: 01/06/2018; 1251 hrs.   Physician-requested Follow-up: Return in about 1 month (around 02/03/2018) for Post Procedure Evaluation.  Future Appointments  Date Time Provider [place]  01/12/2018  9:45 AM Gillis Santa, MD [facility] None  02/05/2018  9:20 AM Mikey College, NP [facility] None  02/10/2018 12:15 PM Gillis Santa, MD [facility] None   Primary Care Physician: Mikey College, NP Location: [facility] Note by: Gillis Santa, MD Date: 01/06/2018; Time: 3:02 PM  Disclaimer:  Medicine is not an exact science. The only guarantee in medicine is that nothing is guaranteed. It is important to note that the decision to proceed with this intervention was based on the information collected from the patient. The Data and conclusions were drawn from the patient's questionnaire, the interview, and the physical examination. Because the information was provided in large part by the patient, it cannot be guaranteed that it has not been purposely or unconsciously manipulated. Every effort has been made to obtain as much relevant data as possible for this evaluation. It is important to note that the conclusions that lead to this procedure are derived in large part from the available data. Always take into account that the treatment will also be dependent on availability of resources and existing treatment guidelines, considered by other Pain Management Practitioners as being common knowledge and practice, at the time of the intervention. For Medico-Legal purposes, it is also important to point out that variation in procedural techniques and pharmacological choices are the acceptable norm. The indications, contraindications, technique, and results of the above procedure should only be interpreted and judged by a Board-Certified Interventional Pain Specialist with extensive familiarity and expertise in the same exact procedure and technique.

## 2018-01-11 ENCOUNTER — Other Ambulatory Visit: Payer: Self-pay

## 2018-01-11 DIAGNOSIS — E1142 Type 2 diabetes mellitus with diabetic polyneuropathy: Secondary | ICD-10-CM

## 2018-01-11 DIAGNOSIS — I10 Essential (primary) hypertension: Secondary | ICD-10-CM

## 2018-01-11 MED ORDER — EMPAGLIFLOZIN 25 MG PO TABS: 25.0000 mg | ORAL_TABLET | Freq: Every day | ORAL | 1 refills | Status: DC

## 2018-01-11 MED ORDER — ENALAPRIL MALEATE 20 MG PO TABS: 20.0000 mg | ORAL_TABLET | Freq: Two times a day (BID) | ORAL | 11 refills | Status: DC

## 2018-01-12 ENCOUNTER — Encounter: Payer: BLUE CROSS/BLUE SHIELD | Admitting: Student in an Organized Health Care Education/Training Program

## 2018-02-05 ENCOUNTER — Ambulatory Visit: Payer: BLUE CROSS/BLUE SHIELD | Admitting: Nurse Practitioner

## 2018-02-10 ENCOUNTER — Encounter: Payer: Self-pay | Admitting: Student in an Organized Health Care Education/Training Program

## 2018-02-10 ENCOUNTER — Ambulatory Visit
Payer: BLUE CROSS/BLUE SHIELD | Attending: Student in an Organized Health Care Education/Training Program | Admitting: Student in an Organized Health Care Education/Training Program

## 2018-02-10 ENCOUNTER — Other Ambulatory Visit: Payer: Self-pay

## 2018-02-10 VITALS — BP 117/75 | HR 84 | Temp 98.0°F | Resp 18 | Ht 62.0 in | Wt 240.0 lb

## 2018-02-10 DIAGNOSIS — M47816 Spondylosis without myelopathy or radiculopathy, lumbar region: Secondary | ICD-10-CM

## 2018-02-10 DIAGNOSIS — M545 Low back pain: Secondary | ICD-10-CM | POA: Insufficient documentation

## 2018-02-10 DIAGNOSIS — I119 Hypertensive heart disease without heart failure: Secondary | ICD-10-CM | POA: Insufficient documentation

## 2018-02-10 DIAGNOSIS — M170 Bilateral primary osteoarthritis of knee: Secondary | ICD-10-CM | POA: Insufficient documentation

## 2018-02-10 DIAGNOSIS — M533 Sacrococcygeal disorders, not elsewhere classified: Secondary | ICD-10-CM | POA: Diagnosis not present

## 2018-02-10 DIAGNOSIS — Z7984 Long term (current) use of oral hypoglycemic drugs: Secondary | ICD-10-CM | POA: Insufficient documentation

## 2018-02-10 DIAGNOSIS — I429 Cardiomyopathy, unspecified: Secondary | ICD-10-CM | POA: Insufficient documentation

## 2018-02-10 DIAGNOSIS — E785 Hyperlipidemia, unspecified: Secondary | ICD-10-CM | POA: Insufficient documentation

## 2018-02-10 DIAGNOSIS — Z79899 Other long term (current) drug therapy: Secondary | ICD-10-CM | POA: Diagnosis not present

## 2018-02-10 DIAGNOSIS — M5136 Other intervertebral disc degeneration, lumbar region: Secondary | ICD-10-CM | POA: Diagnosis not present

## 2018-02-10 DIAGNOSIS — G8929 Other chronic pain: Secondary | ICD-10-CM | POA: Diagnosis not present

## 2018-02-10 DIAGNOSIS — M48061 Spinal stenosis, lumbar region without neurogenic claudication: Secondary | ICD-10-CM | POA: Insufficient documentation

## 2018-02-10 DIAGNOSIS — Z888 Allergy status to other drugs, medicaments and biological substances status: Secondary | ICD-10-CM | POA: Diagnosis not present

## 2018-02-10 DIAGNOSIS — E1142 Type 2 diabetes mellitus with diabetic polyneuropathy: Secondary | ICD-10-CM | POA: Diagnosis not present

## 2018-02-10 NOTE — Progress Notes (Signed)
Safety precautions to be maintained throughout the outpatient stay will include: orient to surroundings, keep bed in low position, maintain call bell within reach at all times, provide assistance with transfer out of bed and ambulation.  

## 2018-02-10 NOTE — Progress Notes (Signed)
Patient's Name: Stacy Martinez  MRN: [id number]  Referring Provider: Mikey Martinez, *  DOB: [birth year]  PCP: Stacy College, NP  DOS: 02/10/2018  Note by: Gillis Santa, MD  Service setting: Ambulatory outpatient  Specialty: Interventional Pain Management  Location: [facility] (AMB) Pain Management Facility    Patient type: Established   Primary Reason(s) for Visit: Encounter for post-procedure evaluation of chronic illness with mild to moderate exacerbation CC: Back Pain (low and left)  HPI  [name] is a 62 y.o. year old, female patient, who comes today for a post-procedure evaluation. She has Bowel obstruction ([place]); BP (high blood pressure); DM type 2 with diabetic peripheral neuropathy ([place]); Wound of skin; Hyperlipidemia associated with type 2 diabetes mellitus ([place]); DDD (degenerative disc disease), lumbar; Cardiomyopathy ([place]); Arthritis; Facet syndrome, lumbar; Diabetic neuropathy (H[facility]); DJD (degenerative joint disease) of knee; and Sacroiliac joint dysfunction on their problem list. Her primarily concern today is the Back Pain (low and left)  Pain Assessment: Location: Lower, Left Back Radiating: left leg Onset: More than a month ago Duration: Chronic pain Quality: Aching, Constant, Sharp, Discomfort Severity: 4 /10 (subjective, self-reported pain score)  Note: Reported level is compatible with observation.                         When using our objective Pain Scale, levels between 6 and 10/10 are said to belong in an emergency room, as it progressively worsens from a 6/10, described as severely limiting, requiring emergency care not usually available at an outpatient pain management facility. At a 6/10 level, communication becomes difficult and requires great effort. Assistance to reach the emergency department may be required. Facial flushing and profuse sweating along with potentially dangerous increases in heart rate and blood pressure will be evident. Effect on ADL:   Timing:  Constant Modifying factors: procedures, medications, rest BP: 117/75  HR: 84  [name] comes in today for post-procedure evaluation after the treatment done on 01/06/2018.  Further details on both, my assessment(s), as well as the proposed treatment plan, please see below.  Post-Procedure Assessment  01/06/2018 Procedure: Bilateral L3, L4, L5 facet medial branch nerve block #5 Pre-procedure pain score:  10/10 Post-procedure pain score: 3/10         Influential Factors: BMI: 43.90 kg/m Intra-procedural challenges: None observed.         Assessment challenges: None detected.              Reported side-effects: None.        Post-procedural adverse reactions or complications: None reported         Sedation: Please see nurses note. When no sedatives are used, the analgesic levels obtained are directly associated to the effectiveness of the local anesthetics. However, when sedation is provided, the level of analgesia obtained during the initial 1 hour following the intervention, is believed to be the result of a combination of factors. These factors may include, but are not limited to: 1. The effectiveness of the local anesthetics used. 2. The effects of the analgesic(s) and/or anxiolytic(s) used. 3. The degree of discomfort experienced by the patient at the time of the procedure. 4. The patients ability and reliability in recalling and recording the events. 5. The presence and influence of possible secondary gains and/or psychosocial factors. Reported result: Relief experienced during the 1st hour after the procedure: 100 % (Ultra-Short Term Relief)            Interpretative annotation:  Clinically appropriate result. Analgesia during this period is likely to be Local Anesthetic and/or IV Sedative (Analgesic/Anxiolytic) related.          Effects of local anesthetic: The analgesic effects attained during this period are directly associated to the localized infiltration of local anesthetics and  therefore cary significant diagnostic value as to the etiological location, or anatomical origin, of the pain. Expected duration of relief is directly dependent on the pharmacodynamics of the local anesthetic used. Long-acting (4-6 hours) anesthetics used.  Reported result: Relief during the next 4 to 6 hour after the procedure: 100 % (Short-Term Relief)            Interpretative annotation: Clinically appropriate result. Analgesia during this period is likely to be Local Anesthetic-related.          Long-term benefit: Defined as the period of time past the expected duration of local anesthetics (1 hour for short-acting and 4-6 hours for long-acting). With the possible exception of prolonged sympathetic blockade from the local anesthetics, benefits during this period are typically attributed to, or associated with, other factors such as analgesic sensory neuropraxia, antiinflammatory effects, or beneficial biochemical changes provided by agents other than the local anesthetics.  Reported result: Extended relief following procedure: 100 %(for 3 weeks) (Long-Term Relief)            Interpretative annotation: Clinically appropriate result. Good relief. No permanent benefit expected. Inflammation plays a part in the etiology to the pain.          Current benefits: Defined as reported results that persistent at this point in time.   Analgesia: 50-75 %            Function: Somewhat improved ROM: Somewhat improved Interpretative annotation: Recurrence of symptoms. No permanent benefit expected. Effective therapeutic approach.          Interpretation: Results would suggest a successful therapeutic intervention.                  Plan:  Repeat treatment or therapy and compare extent and duration of benefits.                Laboratory Chemistry  Inflammation Markers (CRP: Acute Phase) (ESR: Chronic Phase) Lab Results  Component Value Date   LATICACIDVEN 1.72 01/02/2014                          Rheumatology Markers No results found for: RF, ANA, LABURIC, URICUR, LYMEIGGIGMAB, LYMEABIGMQN, HLAB27                      Renal Function Markers Lab Results  Component Value Date   BUN 17 11/03/2017   CREATININE 0.76 06/17/5101   BCR NOT APPLICABLE 58/52/7782   GFRAA 98 11/03/2017   GFRNONAA 85 11/03/2017                             Hepatic Function Markers Lab Results  Component Value Date   AST 14 11/03/2017   ALT 16 11/03/2017   ALBUMIN 4.1 04/08/2017   ALKPHOS 119 04/08/2017   LIPASE 19 12/18/2013                        Electrolytes Lab Results  Component Value Date   NA 136 11/03/2017   K 4.4 11/03/2017   CL 101 11/03/2017   CALCIUM 9.3 11/03/2017  Neuropathy Markers Lab Results  Component Value Date   HGBA1C 9.2 11/03/2017                        Bone Pathology Markers No results found for: VD25OH, TD176HY0VPX, TG6269SW5, [id number], 25OHVITD1, 25OHVITD2, 25OHVITD3, TESTOFREE, TESTOSTERONE                       Coagulation Parameters Lab Results  Component Value Date   PLT 533 (H) 06/19/2016                        Cardiovascular Markers Lab Results  Component Value Date   TROPONINI <0.03 03/17/2016   HGB 13.0 06/19/2016   HCT 40.0 06/19/2016                         CA Markers No results found for: CEA, CA125, LABCA2                      Note: Lab results reviewed.  Recent Diagnostic Imaging Results  DG C-Arm 1-60 Min-No Report Fluoroscopy was utilized by the requesting physician.  No radiographic  interpretation.   Complexity Note: Imaging results reviewed. Results shared with Ms. Nourse, using Layman's terms.                         Meds   Current Outpatient Medications:  .  amLODipine (NORVASC) 10 MG tablet, TAKE 1 TABLET BY MOUTH ONCE DAILY, Disp: 90 tablet, Rfl: 3 .  carvedilol (COREG) 25 MG tablet, Take 1 tablet (25 mg total) by mouth 2 (two) times daily with a meal., Disp: 60 tablet, Rfl: 0 .  empagliflozin  (JARDIANCE) 25 MG TABS tablet, Take 25 mg by mouth daily., Disp: 90 tablet, Rfl: 1 .  enalapril (VASOTEC) 20 MG tablet, Take 1 tablet (20 mg total) by mouth 2 (two) times daily., Disp: 60 tablet, Rfl: 11 .  furosemide (LASIX) 40 MG tablet, Take 2 tablets (80 mg total) by mouth daily. (Patient taking differently: Take 40 mg by mouth daily. ), Disp: 60 tablet, Rfl: 0 .  glucose blood (RELION GLUCOSE TEST STRIPS) test strip, Use as instructed, Disp: 100 each, Rfl: 12 .  metformin (FORTAMET) 500 MG (OSM) 24 hr tablet, Take 1 tablet (500 mg total) by mouth 2 (two) times daily with a meal., Disp: 180 tablet, Rfl: 1 .  ReliOn Ultra Thin Lancets MISC, 1 each by Does not apply route daily., Disp: 100 each, Rfl: 11 .  rosuvastatin (CRESTOR) 5 MG tablet, Take 1 tablet (5 mg total) by mouth daily., Disp: 90 tablet, Rfl: 3 .  sitaGLIPtin (JANUVIA) 100 MG tablet, Take 1 tablet (100 mg total) by mouth daily., Disp: 90 tablet, Rfl: 1 .  tiZANidine (ZANAFLEX) 4 MG capsule, Take 1 capsule (4 mg total) by mouth 2 (two) times daily as needed for muscle spasms., Disp: 60 capsule, Rfl: 4  Current Facility-Administered Medications:  .  bupivacaine (PF) (MARCAINE) 0.25 % injection 30 mL, 30 mL, Other, Once, Mohammed Kindle, MD .  ceFAZolin (ANCEF) IVPB 1 g/50 mL premix, 1 g, Intravenous, Once, Mohammed Kindle, MD .  fentaNYL (SUBLIMAZE) injection 100 mcg, 100 mcg, Intravenous, Once, Mohammed Kindle, MD .  lactated ringers infusion 1,000 mL, 1,000 mL, Intravenous, Continuous, Mohammed Kindle, MD, Last Rate: 125 mL/hr at 01/30/16 0933, 1,000 mL at 01/30/16 0933 .  lactated ringers infusion 1,000 mL, 1,000 mL, Intravenous, Continuous, Mohammed Kindle, MD, Last Rate: 125 mL/hr at 03/10/16 1100, 1,000 mL at 03/10/16 1100 .  lactated ringers infusion 1,000 mL, 1,000 mL, Intravenous, Continuous, Mohammed Kindle, MD .  lidocaine (PF) (XYLOCAINE) 1 % injection 10 mL, 10 mL, Subcutaneous, Once, Mohammed Kindle, MD .  midazolam (VERSED) 5  MG/5ML injection 5 mg, 5 mg, Intravenous, Once, Mohammed Kindle, MD .  orphenadrine (NORFLEX) injection 60 mg, 60 mg, Intramuscular, Once, Mohammed Kindle, MD .  sodium chloride flush (NS) 0.9 % injection 20 mL, 20 mL, Other, Once, Mohammed Kindle, MD .  triamcinolone acetonide (KENALOG-40) injection 40 mg, 40 mg, Other, Once, Mohammed Kindle, MD  ROS  Constitutional: Denies any fever or chills Gastrointestinal: No reported hemesis, hematochezia, vomiting, or acute GI distress Musculoskeletal: Denies any acute onset joint swelling, redness, loss of ROM, or weakness Neurological: No reported episodes of acute onset apraxia, aphasia, dysarthria, agnosia, amnesia, paralysis, loss of coordination, or loss of consciousness  Allergies  Ms. Nikolai is allergic to prednisone.  PFSH  Drug: Ms. Degraffenreid  reports that she does not use drugs. Alcohol:  reports that she does not drink alcohol. Tobacco:  reports that she has never smoked. She has never used smokeless tobacco. Medical:  has a past medical history of Allergy, Arthritis, Diabetes mellitus without complication ([place]), Enlarged heart, and Hypertension. Surgical: Ms. Sans  has a past surgical history that includes Colon surgery; Hernia repair; and Wrist fracture surgery (Right). Family: family history includes Cancer in her father; Diabetes in her mother; Mental illness in her son.  Constitutional Exam  General appearance: Well nourished, well developed, and well hydrated. In no apparent acute distress Vitals:   02/10/18 1219  BP: 117/75  Pulse: 84  Resp: 18  Temp: 98 F (36.7 C)  TempSrc: Oral  SpO2: 99%  Weight: 240 lb (108.9 kg)  Height: '5\' 2"'$  (1.575 m)   BMI Assessment: Estimated body mass index is 43.9 kg/m as calculated from the following:   Height as of this encounter: '5\' 2"'$  (1.575 m).   Weight as of this encounter: 240 lb (108.9 kg).  BMI interpretation table: BMI level Category Range association with higher incidence of chronic  pain  <18 kg/m2 Underweight   18.5-24.9 kg/m2 Ideal body weight   25-29.9 kg/m2 Overweight Increased incidence by 20%  30-34.9 kg/m2 Obese (Class I) Increased incidence by 68%  35-39.9 kg/m2 Severe obesity (Class II) Increased incidence by 136%  >40 kg/m2 Extreme obesity (Class III) Increased incidence by 254%   Patient's current BMI Ideal Body weight  Body mass index is 43.9 kg/m. Ideal body weight: 50.1 kg (110 lb 7.2 oz) Adjusted ideal body weight: 73.6 kg (162 lb 4.3 oz)   BMI Readings from Last 4 Encounters:  02/10/18 43.90 kg/m  01/06/18 43.90 kg/m  12/02/17 43.90 kg/m  11/03/17 44.37 kg/m   Wt Readings from Last 4 Encounters:  02/10/18 240 lb (108.9 kg)  01/06/18 240 lb (108.9 kg)  12/02/17 240 lb (108.9 kg)  11/03/17 242 lb 9.6 oz (110 kg)  Psych/Mental status: Alert, oriented x 3 (person, place, & time)       Eyes: PERLA Respiratory: No evidence of acute respiratory distress  Cervical Spine Area Exam  Skin & Axial Inspection: No masses, redness, edema, swelling, or associated skin lesions Alignment: Symmetrical Functional ROM: Unrestricted ROM      Stability: No instability detected Muscle Tone/Strength: Functionally intact. No obvious neuro-muscular anomalies detected. Sensory (Neurological): Unimpaired Palpation:  No palpable anomalies              Upper Extremity (UE) Exam    Side: Right upper extremity  Side: Left upper extremity  Skin & Extremity Inspection: Skin color, temperature, and hair growth are WNL. No peripheral edema or cyanosis. No masses, redness, swelling, asymmetry, or associated skin lesions. No contractures.  Skin & Extremity Inspection: Skin color, temperature, and hair growth are WNL. No peripheral edema or cyanosis. No masses, redness, swelling, asymmetry, or associated skin lesions. No contractures.  Functional ROM: Unrestricted ROM          Functional ROM: Unrestricted ROM          Muscle Tone/Strength: Functionally intact. No obvious  neuro-muscular anomalies detected.  Muscle Tone/Strength: Functionally intact. No obvious neuro-muscular anomalies detected.  Sensory (Neurological): Unimpaired          Sensory (Neurological): Unimpaired          Palpation: No palpable anomalies              Palpation: No palpable anomalies              Provocative Test(s):  Phalen's test: deferred Tinel's test: deferred Apley's scratch test (touch opposite shoulder):  Action 1 (Across chest): deferred Action 2 (Overhead): deferred Action 3 (LB reach): deferred   Provocative Test(s):  Phalen's test: deferred Tinel's test: deferred Apley's scratch test (touch opposite shoulder):  Action 1 (Across chest): deferred Action 2 (Overhead): deferred Action 3 (LB reach): deferred    Thoracic Spine Area Exam  Skin & Axial Inspection: No masses, redness, or swelling Alignment: Symmetrical Functional ROM: Unrestricted ROM Stability: No instability detected Muscle Tone/Strength: Functionally intact. No obvious neuro-muscular anomalies detected. Sensory (Neurological): Unimpaired Muscle strength & Tone: No palpable anomalies  Lumbar Spine Area Exam  Skin & Axial Inspection: No masses, redness, or swelling Alignment: Symmetrical Functional ROM: Decreased ROM affecting both sides Stability: No instability detected Muscle Tone/Strength: Functionally intact. No obvious neuro-muscular anomalies detected. Sensory (Neurological): Articular pain pattern Palpation: No palpable anomalies       Provocative Tests: Lumbar Hyperextension/rotation test: (+) bilaterally for facet joint pain. Lumbar quadrant test (Kemp's test): (+) bilaterally for facet joint pain. Lumbar Lateral bending test: deferred today       Patrick's Maneuver: deferred today                   FABER test: deferred today       Thigh-thrust test: deferred today       S-I compression test: deferred today       S-I distraction test: deferred today        Gait & Posture Assessment   Ambulation: Unassisted Gait: Relatively normal for age and body habitus Posture: WNL   Lower Extremity Exam    Side: Right lower extremity  Side: Left lower extremity  Stability: No instability observed          Stability: No instability observed          Skin & Extremity Inspection: Skin color, temperature, and hair growth are WNL. No peripheral edema or cyanosis. No masses, redness, swelling, asymmetry, or associated skin lesions. No contractures.  Skin & Extremity Inspection: Skin color, temperature, and hair growth are WNL. No peripheral edema or cyanosis. No masses, redness, swelling, asymmetry, or associated skin lesions. No contractures.  Functional ROM: Unrestricted ROM                  Functional ROM: Unrestricted ROM  Muscle Tone/Strength: Functionally intact. No obvious neuro-muscular anomalies detected.  Muscle Tone/Strength: Functionally intact. No obvious neuro-muscular anomalies detected.  Sensory (Neurological): Unimpaired  Sensory (Neurological): Unimpaired  Palpation: No palpable anomalies  Palpation: No palpable anomalies   Assessment  Primary Diagnosis & Pertinent Problem List: The primary encounter diagnosis was Spondylosis without myelopathy or radiculopathy, lumbar region. Diagnoses of Facet syndrome, lumbar, Lumbar spondylosis, and DDD (degenerative disc disease), lumbar were also pertinent to this visit.  Status Diagnosis  Responding Responding Responding 1. Spondylosis without myelopathy or radiculopathy, lumbar region   2. Facet syndrome, lumbar   3. Lumbar spondylosis   4. DDD (degenerative disc disease), lumbar      General Recommendations: The pain condition that the patient suffers from is best treated with a multidisciplinary approach that involves an increase in physical activity to prevent de-conditioning and worsening of the pain cycle, as well as psychological counseling (formal and/or informal) to address the co-morbid psychological  affects of pain. Treatment will often involve judicious use of pain medications and interventional procedures to decrease the pain, allowing the patient to participate in the physical activity that will ultimately produce long-lasting pain reductions. The goal of the multidisciplinary approach is to return the patient to a higher level of overall function and to restore their ability to perform activities of daily living.  62 year old female with a past medical history of cardiomyopathy, hypertension, hyperlipidemia with a history of chronic low back pain secondary to lumbar spondylosis without myelopathy, lumbar spinal stenosis, degenerative joint disease of bilateral knees and SI joint arthralgia. Patient is status post bilateral lumbar facet blockson 01/06/18 is continuing to have ongoing benefit which has decreased over the last week.  She is able to perform activities of daily living, stand up erect for greater period of time as well as ambulate for greater period of time without having as much pain.  Patient states that during the last week, she is having return of her axial low back pain, more pronounced on the left.  She is interested in repeating lumbar facet medial branch nerve blocks.  I had a detailed discussion with the patient that the next steps should be a radiofrequency ablation given that the patient has not participated in physical therapy recently, her insurance company, [employer] will not approve a lumbar radiofrequency ablation.  I will place a another referral for physical therapy and hopefully the patient can do physical therapy after she gets off work at 5 PM so that we can get the lumbar radiofrequency ablation approved which will hopefully provide her with longer lasting pain relief on the order of 6 months to 1 year.  Plan: -Repeat bilateral lumbar facet medial branch nerve blocks L3, L4, L5 with sedation under fluoroscopy #6 -Referral for physical therapy  Future  considerations: Lumbar facet radiofrequency ablation, genicular nerve block  Lab-work, procedure(s), and/or referral(s): Orders Placed This Encounter  Procedures  . LUMBAR FACET(MEDIAL BRANCH NERVE BLOCK) MBNB  . Ambulatory referral to Physical Therapy   Time Note: Greater than 50% of the 25 minute(s) of face-to-face time spent with Ms. Pesch, was spent in counseling/coordination of care regarding: Ms. Scarboro's primary cause of pain, the treatment plan, treatment alternatives, the risks and possible complications of proposed treatment, going over the informed consent, the results, interpretation and significance of  her recent diagnostic interventional treatment(s), realistic expectations, the goals of pain management (increased in functionality) and the need to bring and keep the BMI below 30.  Provider-requested follow-up: Return in  about 4 weeks (around 03/08/2018) for Procedure.  Future Appointments  Date Time Provider [place]  03/10/2018  8:00 AM Stacy College, NP [facility] None    Primary Care Physician: Stacy College, NP Location: [facility] Note by: Gillis Santa, M.D Date: 02/10/2018; Time: 3:32 PM  There are no Patient Instructions on file for this visit.

## 2018-02-25 ENCOUNTER — Telehealth: Payer: Self-pay | Admitting: *Deleted

## 2018-02-25 NOTE — Telephone Encounter (Signed)
Dr Holley Raring, patient states her Diclofenac wasn't prescribed at her last visit and would like for you to send that in for her.   Thank you

## 2018-02-28 MED ORDER — DICLOFENAC SODIUM 75 MG PO TBEC: 75.0000 mg | DELAYED_RELEASE_TABLET | Freq: Two times a day (BID) | ORAL | 1 refills | Status: DC

## 2018-03-02 ENCOUNTER — Telehealth: Payer: Self-pay | Admitting: Student in an Organized Health Care Education/Training Program

## 2018-03-02 NOTE — Telephone Encounter (Signed)
Has only been taking 1 tab per day of Diclofenac, last fill April 3 and needs to get refill, can [place] call this in ? Or does she have to wait for Dr. Holley Raring Please call patient

## 2018-03-02 NOTE — Telephone Encounter (Signed)
Patient notified that Diclofenac was sent in

## 2018-03-10 ENCOUNTER — Ambulatory Visit: Payer: BLUE CROSS/BLUE SHIELD | Admitting: Nurse Practitioner

## 2018-03-29 ENCOUNTER — Ambulatory Visit: Payer: BLUE CROSS/BLUE SHIELD | Admitting: Nurse Practitioner

## 2018-04-12 ENCOUNTER — Ambulatory Visit: Payer: BLUE CROSS/BLUE SHIELD | Admitting: Nurse Practitioner

## 2018-06-09 ENCOUNTER — Other Ambulatory Visit: Payer: Self-pay | Admitting: Nurse Practitioner

## 2018-06-09 DIAGNOSIS — E1142 Type 2 diabetes mellitus with diabetic polyneuropathy: Secondary | ICD-10-CM

## 2018-06-09 NOTE — Telephone Encounter (Signed)
Must have appointment prior to additional refills.  Please call patient to schedule.

## 2018-06-11 ENCOUNTER — Other Ambulatory Visit: Payer: Self-pay

## 2018-06-11 ENCOUNTER — Encounter: Payer: Self-pay | Admitting: Nurse Practitioner

## 2018-06-11 ENCOUNTER — Ambulatory Visit: Payer: BLUE CROSS/BLUE SHIELD | Admitting: Nurse Practitioner

## 2018-06-11 VITALS — BP 116/54 | HR 68 | Ht 62.0 in | Wt 243.2 lb

## 2018-06-11 DIAGNOSIS — G629 Polyneuropathy, unspecified: Secondary | ICD-10-CM

## 2018-06-11 DIAGNOSIS — E1142 Type 2 diabetes mellitus with diabetic polyneuropathy: Secondary | ICD-10-CM

## 2018-06-11 DIAGNOSIS — E785 Hyperlipidemia, unspecified: Secondary | ICD-10-CM | POA: Diagnosis not present

## 2018-06-11 DIAGNOSIS — E1169 Type 2 diabetes mellitus with other specified complication: Secondary | ICD-10-CM

## 2018-06-11 DIAGNOSIS — Z23 Encounter for immunization: Secondary | ICD-10-CM | POA: Diagnosis not present

## 2018-06-11 LAB — POCT GLYCOSYLATED HEMOGLOBIN (HGB A1C): Hemoglobin A1C: 9.3 % — AB (ref 4.0–5.6)

## 2018-06-11 MED ORDER — GABAPENTIN 100 MG PO CAPS: 100.0000 mg | ORAL_CAPSULE | Freq: Three times a day (TID) | ORAL | 3 refills | Status: DC

## 2018-06-11 MED ORDER — PEN NEEDLES 32G X 6 MM MISC: 1.0000 | Freq: Every day | 4 refills | Status: DC

## 2018-06-11 MED ORDER — BASAGLAR KWIKPEN 100 UNIT/ML ~~LOC~~ SOPN: 25.0000 [IU] | PEN_INJECTOR | Freq: Every day | SUBCUTANEOUS | 2 refills | Status: DC

## 2018-06-11 NOTE — Progress Notes (Signed)
Subjective:    Patient ID: Stacy Martinez, female    DOB: October 23, 1955, 62 y.o.   MRN: [id number]  Stacy Martinez is a 62 y.o. female presenting on 06/11/2018 for Diabetes (pt been off Juaniva x 2 weeks , not taking the metformin)  HPI  Diabetes Pt presents today for follow up of Type 2 diabetes mellitus. She is checking fasting am CBG at home with a range of 180-280, 220-230 - Current diabetic medications include: Januvia, Jardiance.  She was taking metformin, but was having explosive diarrhea even on extended release so she stopped this med. - She is symptomatic with polydipsia.   Also having neuropathy pain at bedtime that prevents onset of sleep most nights.  Pain is described as needle-like - She denies polyphagia, polyuria, headaches, diaphoresis, shakiness, chills and changes in vision.   - Clinical course has been unchanged. - She  reports no regular exercise routine. - Her diet is moderate in salt, moderate in fat, and moderate in carbohydrates. - Weight trend: increasing steadily  PREVENTION: Eye exam current (within one year): yes - done at [facility] ([place]) Foot exam current (within one year): yes Lipid/ASCVD risk reduction - on statin: rosuvastatin Kidney protection - on ace or arb: enalapril Recent Labs    11/03/17 0926 06/11/18 1022  HGBA1C 9.2 9.3*    Social History   Tobacco Use  . Smoking status: Never Smoker  . Smokeless tobacco: Never Used  Substance Use Topics  . Alcohol use: No    Alcohol/week: 0.0 standard drinks  . Drug use: No    Review of Systems Per HPI unless specifically indicated above     Objective:    BP (!) 116/54 (BP Location: Right Arm, Patient Position: Sitting, Cuff Size: Large)   Pulse 68   Ht 5\' 2"  (1.575 m)   Wt 243 lb 3.2 oz (110.3 kg)   BMI 44.48 kg/m   Wt Readings from Last 3 Encounters:  06/11/18 243 lb 3.2 oz (110.3 kg)  02/10/18 240 lb (108.9 kg)  01/06/18 240 lb (108.9 kg)    Physical Exam    Constitutional: She is oriented to person, place, and time. She appears well-developed and well-nourished. No distress.  HENT:  Head: Normocephalic and atraumatic.  Cardiovascular: Normal rate, regular rhythm, S1 normal, S2 normal, normal heart sounds and intact distal pulses.  Pulmonary/Chest: Effort normal and breath sounds normal. No respiratory distress.  Neurological: She is alert and oriented to person, place, and time.  Skin: Skin is warm and dry. Capillary refill takes less than 2 seconds.  Psychiatric: She has a normal mood and affect. Her behavior is normal. Judgment and thought content normal.  Vitals reviewed.   Results for orders placed or performed in visit on 06/11/18  POCT glycosylated hemoglobin (Hb A1C)  Result Value Ref Range   Hemoglobin A1C 9.3 (A) 4.0 - 5.6 %   HbA1c POC (<> result, manual entry)     HbA1c, POC (prediabetic range)     HbA1c, POC (controlled diabetic range)        Assessment & Plan:   Problem List Items Addressed This Visit      Endocrine   DM type 2 with diabetic peripheral neuropathy ([place])    Uncontrolled w/ A1c above goal without any improvement over last check. Patient notes continued dietary indiscretions and intolerance of metformin.  Has been taking Jardiance and Januvia without problems other than needing a refill on Januvia.  Plan: 1. Continue Jardiance  and januvia 2. START Basaglar insulin.  Inject 25 units daily at bedtime.  Reviewed CBG goals, low blood sugar symptoms with new insulin, A1c goals. 3. Continue checking blood glucose once daily in am fasting. 4. Follow up 3 months.       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  NO past treatment - is most bothersome at night.  START gabapentin 100 mg at bedtime.  Can increase in future.  Follow-up 3 months       Meds ordered this encounter  Medications  . Insulin Glargine (BASAGLAR KWIKPEN) 100 UNIT/ML SOPN    Sig: Inject 0.25 mLs (25 Units total) into the skin daily.    Dispense:  5 pen    Refill:  2    Order Specific Question:   Supervising Provider    Answer:   Olin Hauser [[id number]]  . Insulin Pen Needle (PEN NEEDLES) 32G X 6 MM MISC    Sig: 1 Device by Does not apply route daily.    Dispense:  100 each    Refill:  4    Order Specific Question:   Supervising Provider    Answer:   Olin Hauser [[id number]]  . gabapentin (NEURONTIN) 100 MG capsule    Sig: Take 1 capsule (100 mg total) by mouth 3 (three) times daily.    Dispense:  90 capsule    Refill:  3    Order Specific Question:   Supervising Provider    Answer:   Olin Hauser [[id number]]   Follow up plan: Return in about 3 months (around 09/11/2018) for diabetes.  Cassell Smiles, DNP, AGPCNP-BC Adult Gerontology Primary Care Nurse Practitioner [facility] 06/14/2018, 8:13 AM

## 2018-06-11 NOTE — Patient Instructions (Addendum)
Stacy Martinez,   Thank you for coming in to clinic today.  1. START basaglar insulin. - Take 25 units daily at the same time.  This is a subcutaneous injection. - Continue checking morning fasting blood sugars.  Goal after starting Basaglar will be < 160 for now.  We will eventually want you less than 120.  Call if not reaching goals of < 160 for morning fasting sugars in about 2-3 weeks. - INCREASE by 4 units daily for 4 days after back injections.  - Send a MyChart message with blood sugars in about 4 weeks.  2. CONTINUE Jardiance and Januvia.  Try these during the day.  3. START gabapentin 100 mg at bedtime for neuropathy.  Your provider would like to you have your annual eye exam. Please contact your current eye doctor or here are some good options for you to contact.   [facility]   Address: [address] [place], [place] [address] Phone: [phone]  Website: [email or web address] [address] [place], [place] [address] Phone: [phone] [email or web address]  [facility]  Address: [place], [place], [place] [address] Phone: [phone]   [facility] [address] [place], [place] [address] Phone: [phone]  [facility] Address: [address], [place], [place] [address]  Phone: [phone]   Please schedule a follow-up appointment with Cassell Smiles, AGNP. Return in about 3 months (around 09/11/2018) for diabetes.  If you have any other questions or concerns, please feel free to call the clinic or send a message through [place]. You may also schedule an earlier appointment if necessary.  You will receive a survey after today's visit either digitally by e-mail or paper by [employer]. Your experiences and feedback matter to [place].  Please respond so we know how we are doing as we provide care for you.   Cassell Smiles, DNP, AGNP-BC Adult Gerontology Nurse Practitioner [place]

## 2018-06-14 ENCOUNTER — Encounter: Payer: Self-pay | Admitting: Nurse Practitioner

## 2018-06-14 NOTE — Assessment & Plan Note (Signed)
Uncontrolled w/ A1c above goal without any improvement over last check. Patient notes continued dietary indiscretions and intolerance of metformin.  Has been taking Jardiance and Januvia without problems other than needing a refill on Januvia.  Plan: 1. Continue Jardiance and januvia 2. START Basaglar insulin.  Inject 25 units daily at bedtime.  Reviewed CBG goals, low blood sugar symptoms with new insulin, A1c goals. 3. Continue checking blood glucose once daily in am fasting. 4. Follow up 3 months.

## 2018-06-15 LAB — COMPLETE METABOLIC PANEL WITH GFR
AG Ratio: 1.3 (calc) (ref 1.0–2.5)
ALT: 31 U/L — ABNORMAL HIGH (ref 6–29)
AST: 25 U/L (ref 10–35)
Albumin: 4 g/dL (ref 3.6–5.1)
Alkaline phosphatase (APISO): 100 U/L (ref 33–130)
BUN: 13 mg/dL (ref 7–25)
CO2: 27 mmol/L (ref 20–32)
Calcium: 9.4 mg/dL (ref 8.6–10.4)
Chloride: 103 mmol/L (ref 98–110)
Creat: 0.84 mg/dL (ref 0.50–0.99)
GFR, Est African American: 86 mL/min/{1.73_m2} (ref >=60)
GFR, Est Non African American: 74 mL/min/{1.73_m2} (ref >=60)
Globulin: 3.1 g/dL (calc) (ref 1.9–3.7)
Glucose, Bld: 213 mg/dL — ABNORMAL HIGH (ref 65–99)
Potassium: 4.8 mmol/L (ref 3.5–5.3)
Sodium: 139 mmol/L (ref 135–146)
Total Bilirubin: 0.6 mg/dL (ref 0.2–1.2)
Total Protein: 7.1 g/dL (ref 6.1–8.1)

## 2018-06-15 LAB — LIPID PANEL
Cholesterol: 210 mg/dL — ABNORMAL HIGH (ref <200)
HDL: 43 mg/dL — ABNORMAL LOW (ref >50)
LDL Cholesterol (Calc): 127 mg/dL (calc) — ABNORMAL HIGH
Non-HDL Cholesterol (Calc): 167 mg/dL (calc) — ABNORMAL HIGH (ref <130)
Total CHOL/HDL Ratio: 4.9 (calc) (ref <5.0)
Triglycerides: 246 mg/dL — ABNORMAL HIGH (ref <150)

## 2018-06-16 MED ORDER — ROSUVASTATIN CALCIUM 10 MG PO TABS: 10.0000 mg | ORAL_TABLET | Freq: Every day | ORAL | 1 refills | Status: DC

## 2018-06-16 NOTE — Addendum Note (Signed)
Addended by: Cassell Smiles R on: 06/16/2018 02:00 PM   Modules accepted: Orders

## 2018-06-21 ENCOUNTER — Other Ambulatory Visit: Payer: Self-pay

## 2018-06-21 ENCOUNTER — Encounter: Payer: Self-pay | Admitting: Student in an Organized Health Care Education/Training Program

## 2018-06-21 ENCOUNTER — Ambulatory Visit (HOSPITAL_BASED_OUTPATIENT_CLINIC_OR_DEPARTMENT_OTHER): Payer: BLUE CROSS/BLUE SHIELD | Admitting: Student in an Organized Health Care Education/Training Program

## 2018-06-21 ENCOUNTER — Ambulatory Visit
Admission: RE | Admit: 2018-06-21 | Discharge: 2018-06-21 | Disposition: A | Discharge status: Home / Self Care | Payer: BLUE CROSS/BLUE SHIELD | Source: Ambulatory Visit | Attending: Student in an Organized Health Care Education/Training Program | Admitting: Student in an Organized Health Care Education/Training Program

## 2018-06-21 VITALS — BP 129/65 | HR 64 | Temp 98.0°F | Resp 16 | Ht 62.0 in | Wt 230.0 lb

## 2018-06-21 DIAGNOSIS — Z888 Allergy status to other drugs, medicaments and biological substances status: Secondary | ICD-10-CM | POA: Insufficient documentation

## 2018-06-21 DIAGNOSIS — Z79899 Other long term (current) drug therapy: Secondary | ICD-10-CM | POA: Diagnosis not present

## 2018-06-21 DIAGNOSIS — M47816 Spondylosis without myelopathy or radiculopathy, lumbar region: Secondary | ICD-10-CM | POA: Diagnosis present

## 2018-06-21 MED ORDER — FENTANYL CITRATE (PF) 100 MCG/2ML IJ SOLN
25.0000 ug | INTRAMUSCULAR | Status: DC | PRN
  Administered 2018-06-21: 75 ug via INTRAVENOUS

## 2018-06-21 MED ORDER — DEXAMETHASONE SODIUM PHOSPHATE 10 MG/ML IJ SOLN
INTRAMUSCULAR | Status: AC
  Filled 2018-06-21: qty 2

## 2018-06-21 MED ORDER — ROPIVACAINE HCL 2 MG/ML IJ SOLN
10.0000 mL | Freq: Once | INTRAMUSCULAR | Status: AC
  Administered 2018-06-21: 10 mL

## 2018-06-21 MED ORDER — FENTANYL CITRATE (PF) 100 MCG/2ML IJ SOLN
INTRAMUSCULAR | Status: AC
  Filled 2018-06-21: qty 2

## 2018-06-21 MED ORDER — DICLOFENAC SODIUM 75 MG PO TBEC: 75.0000 mg | DELAYED_RELEASE_TABLET | Freq: Two times a day (BID) | ORAL | 3 refills | Status: DC

## 2018-06-21 MED ORDER — ROPIVACAINE HCL 2 MG/ML IJ SOLN
1.0000 mL | Freq: Once | INTRAMUSCULAR | Status: AC
  Administered 2018-06-21: 1 mL via EPIDURAL

## 2018-06-21 MED ORDER — DEXAMETHASONE SODIUM PHOSPHATE 10 MG/ML IJ SOLN
10.0000 mg | Freq: Once | INTRAMUSCULAR | Status: AC
  Administered 2018-06-21: 10 mg

## 2018-06-21 MED ORDER — ROPIVACAINE HCL 2 MG/ML IJ SOLN
INTRAMUSCULAR | Status: AC
  Filled 2018-06-21: qty 20

## 2018-06-21 MED ORDER — LACTATED RINGERS IV SOLN
1000.0000 mL | Freq: Once | INTRAVENOUS | Status: AC
  Administered 2018-06-21: 1000 mL via INTRAVENOUS

## 2018-06-21 MED ORDER — LIDOCAINE HCL 2 % IJ SOLN
20.0000 mL | Freq: Once | INTRAMUSCULAR | Status: AC
  Administered 2018-06-21: 400 mg

## 2018-06-21 MED ORDER — LIDOCAINE HCL 2 % IJ SOLN
INTRAMUSCULAR | Status: AC
  Filled 2018-06-21: qty 20

## 2018-06-21 NOTE — Patient Instructions (Signed)

## 2018-06-21 NOTE — Progress Notes (Signed)
Patient's Name: Stacy Martinez  MRN: [id number]  Referring Provider: Mikey College, *  DOB: [birth year]  PCP: Mikey College, NP  DOS: 06/21/2018  Note by: Gillis Santa, MD  Service setting: Ambulatory outpatient  Specialty: Interventional Pain Management  Patient type: Established  Location: [facility] (AMB) Pain Management Facility  Visit type: Interventional Procedure   Primary Reason for Visit: Interventional Pain Management Treatment. CC: Procedure  Procedure:       Anesthesia, Analgesia, Anxiolysis:  Type: Lumbar Facet, Medial Branch Block(s)        #6  Primary Purpose: Therapeutic Region: Posterolateral Lumbosacral Spine Level: L2, L3, L4, L5,Medial Branch Level(s). Injecting these levels blocks the L3-4, L4-5, and L5-S1 lumbar facet joints. Laterality: Bilateral  Type: Moderate (Conscious) Sedation combined with Local Anesthesia Indication(s): Analgesia and Anxiety Route: Intravenous (IV) IV Access: Secured Sedation: Meaningful verbal contact was maintained at all times during the procedure  Local Anesthetic: Lidocaine 2%   Indications: 1. Spondylosis without myelopathy or radiculopathy, lumbar region   2. Facet syndrome, lumbar    Pain Score: Pre-procedure: 6 /10 Post-procedure: 0-No pain/10  Pre-op Assessment:  Stacy Martinez is a 62 y.o. (year old), female patient, seen today for interventional treatment. She  has a past surgical history that includes Colon surgery; Hernia repair; and Wrist fracture surgery (Right). Stacy Martinez has a current medication list which includes the following prescription(s): amlodipine, carvedilol, diclofenac, empagliflozin, enalapril, furosemide, gabapentin, glucose blood, basaglar kwikpen, pen needles, januvia, relion ultra thin lancets, rosuvastatin, and tizanidine, and the following Facility-Administered Medications: bupivacaine (pf), cefazolin, fentanyl, fentanyl, lactated ringers, lactated ringers, lactated ringers, lidocaine (pf), midazolam,  orphenadrine, sodium chloride flush, and triamcinolone acetonide. Her primarily concern today is the Procedure  Initial Vital Signs:  Pulse/HCG Rate: 74ECG Heart Rate: 60 Temp: 98.4 F (36.9 C) Resp: 18 BP: 124/60 SpO2: 98 %  BMI: Estimated body mass index is 42.07 kg/m as calculated from the following:   Height as of this encounter: 5\' 2"  (1.575 m).   Weight as of this encounter: 230 lb (104.3 kg).  Risk Assessment: Allergies: Reviewed. She is allergic to prednisone and metformin and related.  Allergy Precautions: None required Coagulopathies: Reviewed. None identified.  Blood-thinner therapy: None at this time Active Infection(s): Reviewed. None identified. Stacy Martinez is afebrile  Site Confirmation: Stacy Martinez was asked to confirm the procedure and laterality before marking the site Procedure checklist: Completed Consent: Before the procedure and under the influence of no sedative(s), amnesic(s), or anxiolytics, the patient was informed of the treatment options, risks and possible complications. To fulfill our ethical and legal obligations, as recommended by the American Medical Association's Code of Ethics, I have informed the patient of my clinical impression; the nature and purpose of the treatment or procedure; the risks, benefits, and possible complications of the intervention; the alternatives, including doing nothing; the risk(s) and benefit(s) of the alternative treatment(s) or procedure(s); and the risk(s) and benefit(s) of doing nothing. The patient was provided information about the general risks and possible complications associated with the procedure. These may include, but are not limited to: failure to achieve desired goals, infection, bleeding, organ or nerve damage, allergic reactions, paralysis, and death. In addition, the patient was informed of those risks and complications associated to Spine-related procedures, such as failure to decrease pain; infection (i.e.:  Meningitis, epidural or intraspinal abscess); bleeding (i.e.: epidural hematoma, subarachnoid hemorrhage, or any other type of intraspinal or peri-dural bleeding); organ or nerve damage (i.e.: Any type of peripheral nerve, nerve  root, or spinal cord injury) with subsequent damage to sensory, motor, and/or autonomic systems, resulting in permanent pain, numbness, and/or weakness of one or several areas of the body; allergic reactions; (i.e.: anaphylactic reaction); and/or death. Furthermore, the patient was informed of those risks and complications associated with the medications. These include, but are not limited to: allergic reactions (i.e.: anaphylactic or anaphylactoid reaction(s)); adrenal axis suppression; blood sugar elevation that in diabetics may result in ketoacidosis or comma; water retention that in patients with history of congestive heart failure may result in shortness of breath, pulmonary edema, and decompensation with resultant heart failure; weight gain; swelling or edema; medication-induced neural toxicity; particulate matter embolism and blood vessel occlusion with resultant organ, and/or nervous system infarction; and/or aseptic necrosis of one or more joints. Finally, the patient was informed that Medicine is not an exact science; therefore, there is also the possibility of unforeseen or unpredictable risks and/or possible complications that may result in a catastrophic outcome. The patient indicated having understood very clearly. We have given the patient no guarantees and we have made no promises. Enough time was given to the patient to ask questions, all of which were answered to the patient's satisfaction. Stacy Martinez has indicated that she wanted to continue with the procedure. Attestation: I, the ordering provider, attest that I have discussed with the patient the benefits, risks, side-effects, alternatives, likelihood of achieving goals, and potential problems during recovery for the  procedure that I have provided informed consent. Date  Time: 06/21/2018 11:56 AM  Pre-Procedure Preparation:  Monitoring: As per clinic protocol. Respiration, ETCO2, SpO2, BP, heart rate and rhythm monitor placed and checked for adequate function Safety Precautions: Patient was assessed for positional comfort and pressure points before starting the procedure. Time-out: I initiated and conducted the "Time-out" before starting the procedure, as per protocol. The patient was asked to participate by confirming the accuracy of the "Time Out" information. Verification of the correct person, site, and procedure were performed and confirmed by me, the nursing staff, and the patient. "Time-out" conducted as per Joint Commission's Universal Protocol (UP.01.01.01). Time: 1221  Description of Procedure:       Position: Prone Laterality: Bilateral. The procedure was performed in identical fashion on both sides. Levels:  L2, L3, L4, L5, Medial Branch Level(s) Area Prepped: Posterior Lumbosacral Region Prepping solution: ChloraPrep (2% chlorhexidine gluconate and 70% isopropyl alcohol) Safety Precautions: Aspiration looking for blood return was conducted prior to all injections. At no point did we inject any substances, as a needle was being advanced. Before injecting, the patient was told to immediately notify me if she was experiencing any new onset of "ringing in the ears, or metallic taste in the mouth". No attempts were made at seeking any paresthesias. Safe injection practices and needle disposal techniques used. Medications properly checked for expiration dates. SDV (single dose vial) medications used. After the completion of the procedure, all disposable equipment used was discarded in the proper designated medical waste containers. Local Anesthesia: Protocol guidelines were followed. The patient was positioned over the fluoroscopy table. The area was prepped in the usual manner. The time-out was  completed. The target area was identified using fluoroscopy. A 12-in long, straight, sterile hemostat was used with fluoroscopic guidance to locate the targets for each level blocked. Once located, the skin was marked with an approved surgical skin marker. Once all sites were marked, the skin (epidermis, dermis, and hypodermis), as well as deeper tissues (fat, connective tissue and muscle) were infiltrated with a  small amount of a short-acting local anesthetic, loaded on a 10cc syringe with a 25G, 1.5-in  Needle. An appropriate amount of time was allowed for local anesthetics to take effect before proceeding to the next step. Local Anesthetic: Lidocaine 2.0% The unused portion of the local anesthetic was discarded in the proper designated containers. Technical explanation of process:  L2 Medial Branch Nerve Block (MBB): The target area for the L2 medial branch is at the junction of the postero-lateral aspect of the superior articular process and the superior, posterior, and medial edge of the transverse process of L3. Under fluoroscopic guidance, a Quincke needle was inserted until contact was made with os over the superior postero-lateral aspect of the pedicular shadow (target area). After negative aspiration for blood, 1 mL of the nerve block solution was injected without difficulty or complication. The needle was removed intact. L3 Medial Branch Nerve Block (MBB): The target area for the L3 medial branch is at the junction of the postero-lateral aspect of the superior articular process and the superior, posterior, and medial edge of the transverse process of L4. Under fluoroscopic guidance, a Quincke needle was inserted until contact was made with os over the superior postero-lateral aspect of the pedicular shadow (target area). After negative aspiration for blood, 30mL of the nerve block solution was injected without difficulty or complication. The needle was removed intact. L4 Medial Branch Nerve Block  (MBB): The target area for the L4 medial branch is at the junction of the postero-lateral aspect of the superior articular process and the superior, posterior, and medial edge of the transverse process of L5. Under fluoroscopic guidance, a Quincke needle was inserted until contact was made with os over the superior postero-lateral aspect of the pedicular shadow (target area). After negative aspiration for blood, 1 mL of the nerve block solution was injected without difficulty or complication. The needle was removed intact. L5 Medial Branch Nerve Block (MBB): The target area for the L5 medial branch is at the junction of the postero-lateral aspect of the superior articular process and the superior, posterior, and medial edge of the sacral ala. Under fluoroscopic guidance, a Quincke needle was inserted until contact was made with os over the superior postero-lateral aspect of the pedicular shadow (target area). After negative aspiration for blood,1  mL of the nerve block solution was injected without difficulty or complication. The needle was removed intact.  Procedural Needles: 22-gauge, 3.5-inch, Quincke needles used for all levels. Nerve block solution: 10 cc solution made of 9 cc of 0.2% ropivacaine, 1 cc of Decadron 10 mg/cc.  1 to 1.5 cc injected at each level bilaterally.  The unused portion of the solution was discarded in the proper designated containers.  Once the entire procedure was completed, the treated area was cleaned, making sure to leave some of the prepping solution back to take advantage of its long term bactericidal properties.   Illustration of the posterior view of the lumbar spine and the posterior neural structures. Laminae of L2 through S1 are labeled. DPRL5, dorsal primary ramus of L5; DPRS1, dorsal primary ramus of S1; DPR3, dorsal primary ramus of L3; FJ, facet (zygapophyseal) joint L3-L4; I, inferior articular process of L4; LB1, lateral branch of dorsal primary ramus of L1; IAB,  inferior articular branches from L3 medial branch (supplies L4-L5 facet joint); IBP, intermediate branch plexus; MB3, medial branch of dorsal primary ramus of L3; NR3, third lumbar nerve root; S, superior articular process of L5; SAB, superior articular branches from L4 (supplies  L4-5 facet joint also); TP3, transverse process of L3.  Vitals:   06/21/18 1240 06/21/18 1249 06/21/18 1259 06/21/18 1310  BP: 121/64 (!) 109/59 122/61 129/65  Pulse: 64     Resp: 15 14 16 16   Temp:    98 F (36.7 C)  SpO2: 97% 97% 96% 99%  Weight:      Height:        Start Time: 1221 hrs. End Time: 1237 hrs.  Imaging Guidance (Spinal):  Type of Imaging Technique: Fluoroscopy Guidance (Spinal) Indication(s): Assistance in needle guidance and placement for procedures requiring needle placement in or near specific anatomical locations not easily accessible without such assistance. Exposure Time: Please see nurses notes. Contrast: None used. Fluoroscopic Guidance: I was personally present during the use of fluoroscopy. "Tunnel Vision Technique" used to obtain the best possible view of the target area. Parallax error corrected before commencing the procedure. "Direction-depth-direction" technique used to introduce the needle under continuous pulsed fluoroscopy. Once target was reached, antero-posterior, oblique, and lateral fluoroscopic projection used confirm needle placement in all planes. Images permanently stored in EMR. Interpretation: No contrast injected. I personally interpreted the imaging intraoperatively. Adequate needle placement confirmed in multiple planes. Permanent images saved into the patient's record.  Antibiotic Prophylaxis:   Anti-infectives (From admission, onward)   None     Indication(s): None identified  Post-operative Assessment:  Post-procedure Vital Signs:  Pulse/HCG Rate: 6465 Temp: 98 F (36.7 C) Resp: 16 BP: 129/65 SpO2: 99 %  EBL: None  Complications: No immediate  post-treatment complications observed by team, or reported by patient.  Note: The patient tolerated the entire procedure well. A repeat set of vitals were taken after the procedure and the patient was kept under observation following institutional policy, for this type of procedure. Post-procedural neurological assessment was performed, showing return to baseline, prior to discharge. The patient was provided with post-procedure discharge instructions, including a section on how to identify potential problems. Should any problems arise concerning this procedure, the patient was given instructions to immediately contact us, at any time, without hesitation. In any case, we plan to contact the patient by telephone for a follow-up status report regarding this interventional procedure.  Comments:  No additional relevant information. 5 out of 5 strength bilateral lower extremity: Plantar flexion, dorsiflexion, knee flexion, knee extension.  Plan of Care    Imaging Orders     DG C-Arm 1-60 Min-No Report  Procedure Orders    No procedure(s) ordered today    Medications ordered for procedure: Meds ordered this encounter  Medications  . lactated ringers infusion 1,000 mL  . fentaNYL (SUBLIMAZE) injection 25-100 mcg    Make sure Narcan is available in the pyxis when using this medication. In the event of respiratory depression (RR< 8/min): Titrate NARCAN (naloxone) in increments of 0.1 to 0.2 mg IV at 2-3 minute intervals, until desired degree of reversal.  . ropivacaine (PF) 2 mg/mL (0.2%) (NAROPIN) injection 10 mL  . lidocaine (XYLOCAINE) 2 % (with pres) injection 400 mg  . dexamethasone (DECADRON) injection 10 mg  . dexamethasone (DECADRON) injection 10 mg  . ropivacaine (PF) 2 mg/mL (0.2%) (NAROPIN) injection 1 mL  . diclofenac (VOLTAREN) 75 MG EC tablet    Sig: Take 1 tablet (75 mg total) by mouth 2 (two) times daily.    Dispense:  60 tablet    Refill:  3   Medications administered: We  administered lactated ringers, fentaNYL, ropivacaine (PF) 2 mg/mL (0.2%), lidocaine, dexamethasone, dexamethasone, and ropivacaine (PF)  2 mg/mL (0.2%).  See the medical record for exact dosing, route, and time of administration.  New Prescriptions   No medications on file   Disposition: Discharge home  Discharge Date & Time: 06/21/2018; 1310 hrs.   Physician-requested Follow-up: Return if symptoms worsen or fail to improve.  No future appointments. Primary Care Physician: Mikey College, NP Location: [facility] Note by: Gillis Santa, MD Date: 06/21/2018; Time: 1:31 PM  Disclaimer:  Medicine is not an exact science. The only guarantee in medicine is that nothing is guaranteed. It is important to note that the decision to proceed with this intervention was based on the information collected from the patient. The Data and conclusions were drawn from the patient's questionnaire, the interview, and the physical examination. Because the information was provided in large part by the patient, it cannot be guaranteed that it has not been purposely or unconsciously manipulated. Every effort has been made to obtain as much relevant data as possible for this evaluation. It is important to note that the conclusions that lead to this procedure are derived in large part from the available data. Always take into account that the treatment will also be dependent on availability of resources and existing treatment guidelines, considered by other Pain Management Practitioners as being common knowledge and practice, at the time of the intervention. For Medico-Legal purposes, it is also important to point out that variation in procedural techniques and pharmacological choices are the acceptable norm. The indications, contraindications, technique, and results of the above procedure should only be interpreted and judged by a Board-Certified Interventional Pain Specialist with  extensive familiarity and expertise in the same exact procedure and technique.

## 2018-06-21 NOTE — Progress Notes (Signed)
Safety precautions to be maintained throughout the outpatient stay will include: orient to surroundings, keep bed in low position, maintain call bell within reach at all times, provide assistance with transfer out of bed and ambulation.  

## 2018-06-22 ENCOUNTER — Telehealth: Payer: Self-pay | Admitting: *Deleted

## 2018-06-22 NOTE — Telephone Encounter (Signed)
Attempted to call for post procedure follow-up. Message left. 

## 2018-07-13 ENCOUNTER — Other Ambulatory Visit: Payer: Self-pay

## 2018-07-13 ENCOUNTER — Other Ambulatory Visit: Payer: Self-pay | Admitting: Nurse Practitioner

## 2018-07-13 DIAGNOSIS — E1142 Type 2 diabetes mellitus with diabetic polyneuropathy: Secondary | ICD-10-CM

## 2018-07-18 ENCOUNTER — Other Ambulatory Visit: Payer: Self-pay | Admitting: Nurse Practitioner

## 2018-07-18 DIAGNOSIS — E1142 Type 2 diabetes mellitus with diabetic polyneuropathy: Secondary | ICD-10-CM

## 2018-07-20 ENCOUNTER — Telehealth: Payer: Self-pay | Admitting: Family Medicine

## 2018-07-20 NOTE — Telephone Encounter (Signed)
Pt needs refills on januvia and lasix sent to [facility] [facility].  Her call back number [phone]

## 2018-07-21 MED ORDER — FUROSEMIDE 40 MG PO TABS: 80.0000 mg | ORAL_TABLET | Freq: Every day | ORAL | 0 refills | Status: DC

## 2018-07-21 MED ORDER — CARVEDILOL 25 MG PO TABS: 25.0000 mg | ORAL_TABLET | Freq: Two times a day (BID) | ORAL | 0 refills | Status: DC

## 2018-07-21 NOTE — Telephone Encounter (Signed)
Left message for patient.  Januvia was already sent on 07/19/18.  However, we have a question on Lasix dose.

## 2018-07-21 NOTE — Telephone Encounter (Signed)
Spoke with patient and she takes furosemide as needed.  She reported that she takes more in summer due to swelling.  Refilled medications

## 2018-08-17 ENCOUNTER — Ambulatory Visit: Payer: BLUE CROSS/BLUE SHIELD | Admitting: Student in an Organized Health Care Education/Training Program

## 2018-09-13 ENCOUNTER — Ambulatory Visit: Payer: BLUE CROSS/BLUE SHIELD | Admitting: Student in an Organized Health Care Education/Training Program

## 2018-09-23 ENCOUNTER — Ambulatory Visit
Admission: RE | Admit: 2018-09-23 | Discharge: 2018-09-23 | Disposition: A | Discharge status: Home / Self Care | Payer: BLUE CROSS/BLUE SHIELD | Source: Ambulatory Visit | Attending: Student in an Organized Health Care Education/Training Program | Admitting: Student in an Organized Health Care Education/Training Program

## 2018-09-23 ENCOUNTER — Other Ambulatory Visit: Payer: Self-pay

## 2018-09-23 ENCOUNTER — Ambulatory Visit: Payer: BLUE CROSS/BLUE SHIELD | Admitting: Student in an Organized Health Care Education/Training Program

## 2018-09-23 ENCOUNTER — Encounter: Payer: Self-pay | Admitting: Student in an Organized Health Care Education/Training Program

## 2018-09-23 VITALS — BP 143/66 | HR 74 | Temp 97.8°F | Resp 18 | Ht 62.0 in | Wt 240.0 lb

## 2018-09-23 DIAGNOSIS — M25511 Pain in right shoulder: Secondary | ICD-10-CM | POA: Insufficient documentation

## 2018-09-23 DIAGNOSIS — M47816 Spondylosis without myelopathy or radiculopathy, lumbar region: Secondary | ICD-10-CM

## 2018-09-23 DIAGNOSIS — M67911 Unspecified disorder of synovium and tendon, right shoulder: Secondary | ICD-10-CM

## 2018-09-23 DIAGNOSIS — G8929 Other chronic pain: Secondary | ICD-10-CM | POA: Insufficient documentation

## 2018-09-23 DIAGNOSIS — M1990 Unspecified osteoarthritis, unspecified site: Secondary | ICD-10-CM

## 2018-09-23 MED ORDER — KETOROLAC TROMETHAMINE 30 MG/ML IJ SOLN
INTRAMUSCULAR | Status: AC
  Filled 2018-09-23: qty 1

## 2018-09-23 MED ORDER — TIZANIDINE HCL 4 MG PO CAPS: 4.0000 mg | ORAL_CAPSULE | Freq: Two times a day (BID) | ORAL | 4 refills | Status: DC | PRN

## 2018-09-23 MED ORDER — ORPHENADRINE CITRATE 30 MG/ML IJ SOLN
INTRAMUSCULAR | Status: AC
  Filled 2018-09-23: qty 2

## 2018-09-23 MED ORDER — ORPHENADRINE CITRATE 30 MG/ML IJ SOLN
30.0000 mg | Freq: Once | INTRAMUSCULAR | Status: AC
  Administered 2018-09-23: 60 mg via INTRAMUSCULAR

## 2018-09-23 MED ORDER — KETOROLAC TROMETHAMINE 30 MG/ML IJ SOLN
30.0000 mg | Freq: Once | INTRAMUSCULAR | Status: AC
  Administered 2018-09-23: 30 mg via INTRAMUSCULAR

## 2018-09-23 NOTE — Patient Instructions (Signed)
A prescription was sent to your pharmacy.  ____________________________________________________________________________________________  Preparing for Procedure with Sedation  Instructions: . Oral Intake: Do not eat or drink anything for at least 8 hours prior to your procedure. . Transportation: Public transportation is not allowed. Bring an adult driver. The driver must be physically present in our waiting room before any procedure can be started. Marland Kitchen Physical Assistance: Bring an adult physically capable of assisting you, in the event you need help. This adult should keep you company at home for at least 6 hours after the procedure. . Blood Pressure Medicine: Take your blood pressure medicine with a sip of water the morning of the procedure. . Blood thinners: Notify our staff if you are taking any blood thinners. Depending on which one you take, there will be specific instructions on how and when to stop it. . Diabetics on insulin: Notify the staff so that you can be scheduled 1st case in the morning. If your diabetes requires high dose insulin, take only  of your normal insulin dose the morning of the procedure and notify the staff that you have done so. . Preventing infections: Shower with an antibacterial soap the morning of your procedure. . Build-up your immune system: Take 1000 mg of Vitamin C with every meal (3 times a day) the day prior to your procedure. Marland Kitchen Antibiotics: Inform the staff if you have a condition or reason that requires you to take antibiotics before dental procedures. . Pregnancy: If you are pregnant, call and cancel the procedure. . Sickness: If you have a cold, fever, or any active infections, call and cancel the procedure. . Arrival: You must be in the facility at least 30 minutes prior to your scheduled procedure. . Children: Do not bring children with you. . Dress appropriately: Bring dark clothing that you would not mind if they get stained. . Valuables: Do not  bring any jewelry or valuables.  Procedure appointments are reserved for interventional treatments only. Marland Kitchen No Prescription Refills. . No medication changes will be discussed during procedure appointments. . No disability issues will be discussed.  Reasons to call and reschedule or cancel your procedure: (Following these recommendations will minimize the risk of a serious complication.) . Surgeries: Avoid having procedures within 2 weeks of any surgery. (Avoid for 2 weeks before or after any surgery). . Flu Shots: Avoid having procedures within 2 weeks of a flu shots or . (Avoid for 2 weeks before or after immunizations). . Barium: Avoid having a procedure within 7-10 days after having had a radiological study involving the use of radiological contrast. (Myelograms, Barium swallow or enema study). . Heart attacks: Avoid any elective procedures or surgeries for the initial 6 months after a "Myocardial Infarction" (Heart Attack). . Blood thinners: It is imperative that you stop these medications before procedures. Let us know if you if you take any blood thinner.  . Infection: Avoid procedures during or within two weeks of an infection (including chest colds or gastrointestinal problems). Symptoms associated with infections include: Localized redness, fever, chills, night sweats or profuse sweating, burning sensation when voiding, cough, congestion, stuffiness, runny nose, sore throat, diarrhea, nausea, vomiting, cold or Flu symptoms, recent or current infections. It is specially important if the infection is over the area that we intend to treat. Marland Kitchen Heart and lung problems: Symptoms that may suggest an active cardiopulmonary problem include: cough, chest pain, breathing difficulties or shortness of breath, dizziness, ankle swelling, uncontrolled high or unusually low blood pressure, and/or palpitations. If  you are experiencing any of these symptoms, cancel your procedure and contact your primary care  physician for an evaluation.  Remember:  Regular Business hours are:  Monday to Thursday 8:00 AM to 4:00 PM  Provider's Schedule: Milinda Pointer, MD:  Procedure days: Tuesday and Thursday 7:30 AM to 4:00 PM  Gillis Santa, MD:  Procedure days: Monday and Wednesday 7:30 AM to 4:00 PM ____________________________________________________________________________________________

## 2018-09-23 NOTE — Progress Notes (Signed)
Patient's Name: Stacy Martinez  MRN: [id number]  Referring Provider: Mikey College, *  DOB: April 30, 1956  PCP: Mikey College, NP  DOS: 09/23/2018  Note by: Gillis Santa, MD  Service setting: Ambulatory outpatient  Specialty: Interventional Pain Management  Location: [facility] (AMB) Pain Management Facility    Patient type: Established   Primary Reason(s) for Visit: Encounter for post-procedure evaluation of chronic illness with mild to moderate exacerbation CC: Shoulder Pain (right) and Back Pain (lower)  HPI  Stacy Martinez is a 63 y.o. year old, female patient, who comes today for a post-procedure evaluation. She has Bowel obstruction ([place]); BP (high blood pressure); DM type 2 with diabetic peripheral neuropathy ([place]); Wound of skin; Hyperlipidemia associated with type 2 diabetes mellitus ([place]); DDD (degenerative disc disease), lumbar; Cardiomyopathy ([place]); Arthritis; Facet syndrome, lumbar; Diabetic neuropathy ([facility]); DJD (degenerative joint disease) of knee; and Sacroiliac joint dysfunction on their problem list. Her primarily concern today is the Shoulder Pain (right) and Back Pain (lower)  Pain Assessment: Location: Right Shoulder Radiating: right arm to just above the hand Onset: More than a month ago Duration: Chronic pain Quality: Aching, Constant, Shooting, Dull Severity: 9 /10 (subjective, self-reported pain score)  Note: Reported level is inconsistent with clinical observations. Clinically the patient looks like a 3/10 A 3/10 is viewed as "Moderate" and described as significantly interfering with activities of daily living (ADL). It becomes difficult to feed, bathe, get dressed, get on and off the toilet or to perform personal hygiene functions. Difficult to get in and out of bed or a chair without assistance. Very distracting. With effort, it can be ignored when deeply involved in activities. Information on the proper use of the pain scale provided to the patient today. When using our  objective Pain Scale, levels between 6 and 10/10 are said to belong in an emergency room, as it progressively worsens from a 6/10, described as severely limiting, requiring emergency care not usually available at an outpatient pain management facility. At a 6/10 level, communication becomes difficult and requires great effort. Assistance to reach the emergency department may be required. Facial flushing and profuse sweating along with potentially dangerous increases in heart rate and blood pressure will be evident. Effect on ADL:   Timing: Constant Modifying factors: not moving BP: (!) 143/66  HR: 74  [name] comes in today for post-procedure evaluation.  Further details on both, my assessment(s), as well as the proposed treatment plan, please see below.  Patient follows up status post bilateral lumbar facet medial branch nerve block performed on 06/21/2018.  Patient states that the procedure provided her with significant pain relief for the first week which reduced to approximately 50% 4 weeks thereafter.  Today her primary pain complaint is her right shoulder.  Patient has significant difficulty past 90 degrees shoulder abduction.  This results in pain in the inferior and posterior portion of her shoulder.  This started in approximately November.  No inciting event no trauma.  Is having difficulty with basic ADLs given pain with shoulder flexion and abduction.  Post-Procedure Assessment  06/21/2018 Procedure: L2- L3-L4-L5 facet medial branch nerve block Pre-procedure pain score:  6/10 Post-procedure pain score: 0/10         Influential Factors: BMI: 43.90 kg/m Intra-procedural challenges: None observed.         Assessment challenges: None detected.              Reported side-effects: None.        Post-procedural adverse  reactions or complications: None reported         Sedation: Please see nurses note. When no sedatives are used, the analgesic levels obtained are directly associated to  the effectiveness of the local anesthetics. However, when sedation is provided, the level of analgesia obtained during the initial 1 hour following the intervention, is believed to be the result of a combination of factors. These factors may include, but are not limited to: 1. The effectiveness of the local anesthetics used. 2. The effects of the analgesic(s) and/or anxiolytic(s) used. 3. The degree of discomfort experienced by the patient at the time of the procedure. 4. The patients ability and reliability in recalling and recording the events. 5. The presence and influence of possible secondary gains and/or psychosocial factors. Reported result: Relief experienced during the 1st hour after the procedure: 100 % (Ultra-Short Term Relief)            Interpretative annotation: Clinically appropriate result. Analgesia during this period is likely to be Local Anesthetic and/or IV Sedative (Analgesic/Anxiolytic) related.          Effects of local anesthetic: The analgesic effects attained during this period are directly associated to the localized infiltration of local anesthetics and therefore cary significant diagnostic value as to the etiological location, or anatomical origin, of the pain. Expected duration of relief is directly dependent on the pharmacodynamics of the local anesthetic used. Long-acting (4-6 hours) anesthetics used.  Reported result: Relief during the next 4 to 6 hour after the procedure: 100 % (Short-Term Relief)            Interpretative annotation: Clinically appropriate result. Analgesia during this period is likely to be Local Anesthetic-related.          Long-term benefit: Defined as the period of time past the expected duration of local anesthetics (1 hour for short-acting and 4-6 hours for long-acting). With the possible exception of prolonged sympathetic blockade from the local anesthetics, benefits during this period are typically attributed to, or associated with, other  factors such as analgesic sensory neuropraxia, antiinflammatory effects, or beneficial biochemical changes provided by agents other than the local anesthetics.  Reported result: Extended relief following procedure: 100 %(lasted 3-4 days)and then decreased to 50%  (Long-Term Relief)            Interpretative annotation: Clinically possible results. Good relief. No permanent benefit expected. Inflammation plays a part in the etiology to the pain.          Current benefits: Defined as reported results that persistent at this point in time.   Analgesia: 25-50 %            Function: Somewhat improved ROM: Somewhat improved Interpretative annotation: Recurrence of symptoms. No permanent benefit expected. Effective palliative intervention.          Interpretation: Results would suggest a successful palliative intervention.                  Plan:  Please see "Plan of Care" for details.                Laboratory Chemistry  Inflammation Markers (CRP: Acute Phase) (ESR: Chronic Phase) Lab Results  Component Value Date   LATICACIDVEN 1.72 01/02/2014                         Rheumatology Markers No results found for: RF, ANA, [place], [place], [place], [place], HLAB27  Renal Function Markers Lab Results  Component Value Date   BUN 13 06/15/2018   CREATININE 0.84 62/10/5595   BCR NOT APPLICABLE 41/63/8453   GFRAA 86 06/15/2018   GFRNONAA 74 06/15/2018                             Hepatic Function Markers Lab Results  Component Value Date   AST 25 06/15/2018   ALT 31 (H) 06/15/2018   ALBUMIN 4.1 04/08/2017   ALKPHOS 119 04/08/2017   LIPASE 19 12/18/2013                        Electrolytes Lab Results  Component Value Date   NA 139 06/15/2018   K 4.8 06/15/2018   CL 103 06/15/2018   CALCIUM 9.4 06/15/2018                        Neuropathy Markers Lab Results  Component Value Date   HGBA1C 9.3 (A) 06/11/2018                        CNS Tests No  results found for: COLORCSF, APPEARCSF, RBCCOUNTCSF, WBCCSF, POLYSCSF, LYMPHSCSF, EOSCSF, PROTEINCSF, GLUCCSF, JCVIRUS, CSFOLI, IGGCSF                      Bone Pathology Markers No results found for: VD25OH, VD125OH2TOT, [id number], MI6803OZ2, 25OHVITD1, 25OHVITD2, 25OHVITD3, TESTOFREE, TESTOSTERONE                       Coagulation Parameters Lab Results  Component Value Date   PLT 533 (H) 06/19/2016                        Cardiovascular Markers Lab Results  Component Value Date   TROPONINI <0.03 03/17/2016   HGB 13.0 06/19/2016   HCT 40.0 06/19/2016                         CA Markers No results found for: CEA, CA125, LABCA2                      Note: Lab results reviewed.  Recent Diagnostic Imaging Results  DG C-Arm 1-60 Min-No Report Fluoroscopy was utilized by the requesting physician.  No radiographic  interpretation.   Complexity Note: Imaging results reviewed. Results shared with Ms. Lapenna, using Layman's terms.                         Meds   Current Outpatient Medications:  .  amLODipine (NORVASC) 10 MG tablet, TAKE 1 TABLET BY MOUTH ONCE DAILY, Disp: 90 tablet, Rfl: 3 .  carvedilol (COREG) 25 MG tablet, Take 1 tablet (25 mg total) by mouth 2 (two) times daily with a meal., Disp: 60 tablet, Rfl: 0 .  diclofenac (VOLTAREN) 75 MG EC tablet, Take 1 tablet (75 mg total) by mouth 2 (two) times daily., Disp: 60 tablet, Rfl: 3 .  empagliflozin (JARDIANCE) 25 MG TABS tablet, Take 25 mg by mouth daily., Disp: 90 tablet, Rfl: 1 .  enalapril (VASOTEC) 20 MG tablet, Take 1 tablet (20 mg total) by mouth 2 (two) times daily., Disp: 60 tablet, Rfl: 11 .  furosemide (LASIX) 40 MG tablet, Take 2 tablets (80 mg total)  by mouth daily., Disp: 60 tablet, Rfl: 0 .  gabapentin (NEURONTIN) 100 MG capsule, Take 1 capsule (100 mg total) by mouth 3 (three) times daily., Disp: 90 capsule, Rfl: 3 .  glucose blood (RELION GLUCOSE TEST STRIPS) test strip, Use as instructed, Disp: 100 each, Rfl:  12 .  Insulin Glargine (BASAGLAR KWIKPEN) 100 UNIT/ML SOPN, Inject 0.25 mLs (25 Units total) into the skin daily., Disp: 5 pen, Rfl: 2 .  Insulin Pen Needle (PEN NEEDLES) 32G X 6 MM MISC, 1 Device by Does not apply route daily., Disp: 100 each, Rfl: 4 .  JANUVIA 100 MG tablet, TAKE 1 TABLET BY MOUTH ONCE DAILY *NEEDS APPOINTMENT FOR REFILLS*, Disp: 90 tablet, Rfl: 1 .  ReliOn Ultra Thin Lancets MISC, 1 each by Does not apply route daily., Disp: 100 each, Rfl: 11 .  rosuvastatin (CRESTOR) 10 MG tablet, Take 1 tablet (10 mg total) by mouth daily., Disp: 90 tablet, Rfl: 1 .  tiZANidine (ZANAFLEX) 4 MG capsule, Take 1 capsule (4 mg total) by mouth 2 (two) times daily as needed for muscle spasms., Disp: 60 capsule, Rfl: 4  Current Facility-Administered Medications:  .  bupivacaine (PF) (MARCAINE) 0.25 % injection 30 mL, 30 mL, Other, Once, Mohammed Kindle, MD .  ceFAZolin (ANCEF) IVPB 1 g/50 mL premix, 1 g, Intravenous, Once, Mohammed Kindle, MD .  fentaNYL (SUBLIMAZE) injection 100 mcg, 100 mcg, Intravenous, Once, Mohammed Kindle, MD .  lactated ringers infusion 1,000 mL, 1,000 mL, Intravenous, Continuous, Mohammed Kindle, MD, Last Rate: 125 mL/hr at 01/30/16 0933, 1,000 mL at 01/30/16 0933 .  lactated ringers infusion 1,000 mL, 1,000 mL, Intravenous, Continuous, Mohammed Kindle, MD, Last Rate: 125 mL/hr at 03/10/16 1100, 1,000 mL at 03/10/16 1100 .  lactated ringers infusion 1,000 mL, 1,000 mL, Intravenous, Continuous, Mohammed Kindle, MD .  lidocaine (PF) (XYLOCAINE) 1 % injection 10 mL, 10 mL, Subcutaneous, Once, Mohammed Kindle, MD .  midazolam (VERSED) 5 MG/5ML injection 5 mg, 5 mg, Intravenous, Once, Mohammed Kindle, MD .  orphenadrine (NORFLEX) injection 60 mg, 60 mg, Intramuscular, Once, Mohammed Kindle, MD .  sodium chloride flush (NS) 0.9 % injection 20 mL, 20 mL, Other, Once, Mohammed Kindle, MD .  triamcinolone acetonide (KENALOG-40) injection 40 mg, 40 mg, Other, Once, Mohammed Kindle, MD  ROS   Constitutional: Denies any fever or chills Gastrointestinal: No reported hemesis, hematochezia, vomiting, or acute GI distress Musculoskeletal: Denies any acute onset joint swelling, redness, loss of ROM, or weakness Neurological: No reported episodes of acute onset apraxia, aphasia, dysarthria, agnosia, amnesia, paralysis, loss of coordination, or loss of consciousness  Allergies  Ms. Diegel is allergic to prednisone and metformin and related.  PFSH  Drug: Ms. Wake  reports no history of drug use. Alcohol:  reports no history of alcohol use. Tobacco:  reports that she has never smoked. She has never used smokeless tobacco. Medical:  has a past medical history of Allergy, Arthritis, Diabetes mellitus without complication ([place]), Enlarged heart, and Hypertension. Surgical: Ms. Cathell  has a past surgical history that includes Colon surgery; Hernia repair; and Wrist fracture surgery (Right). Family: family history includes Cancer in her father; Diabetes in her mother; Mental illness in her son.  Constitutional Exam  General appearance: Well nourished, well developed, and well hydrated. In no apparent acute distress Vitals:   09/23/18 1446  BP: (!) 143/66  Pulse: 74  Resp: 18  Temp: 97.8 F (36.6 C)  TempSrc: Oral  SpO2: 99%  Weight: 240 lb (108.9 kg)  Height:  $'5\' 2"'P$  (1.575 m)   BMI Assessment: Estimated body mass index is 43.9 kg/m as calculated from the following:   Height as of this encounter: '5\' 2"'$  (1.575 m).   Weight as of this encounter: 240 lb (108.9 kg).  BMI interpretation table: BMI level Category Range association with higher incidence of chronic pain  <18 kg/m2 Underweight   18.5-24.9 kg/m2 Ideal body weight   25-29.9 kg/m2 Overweight Increased incidence by 20%  30-34.9 kg/m2 Obese (Class I) Increased incidence by 68%  35-39.9 kg/m2 Severe obesity (Class II) Increased incidence by 136%  >40 kg/m2 Extreme obesity (Class III) Increased incidence by 254%   Patient's  current BMI Ideal Body weight  Body mass index is 43.9 kg/m. Ideal body weight: 50.1 kg (110 lb 7.2 oz) Adjusted ideal body weight: 73.6 kg (162 lb 4.3 oz)   BMI Readings from Last 4 Encounters:  09/23/18 43.90 kg/m  06/21/18 42.07 kg/m  06/11/18 44.48 kg/m  02/10/18 43.90 kg/m   Wt Readings from Last 4 Encounters:  09/23/18 240 lb (108.9 kg)  06/21/18 230 lb (104.3 kg)  06/11/18 243 lb 3.2 oz (110.3 kg)  02/10/18 240 lb (108.9 kg)  Psych/Mental status: Alert, oriented x 3 (person, place, & time)       Eyes: PERLA Respiratory: No evidence of acute respiratory distress  Cervical Spine Area Exam  Skin & Axial Inspection: No masses, redness, edema, swelling, or associated skin lesions Alignment: Symmetrical Functional ROM: Unrestricted ROM      Stability: No instability detected Muscle Tone/Strength: Functionally intact. No obvious neuro-muscular anomalies detected. Sensory (Neurological): Unimpaired Palpation: No palpable anomalies              Upper Extremity (UE) Exam    Side: Right upper extremity  Side: Left upper extremity  Skin & Extremity Inspection: Skin color, temperature, and hair growth are WNL. No peripheral edema or cyanosis. No masses, redness, swelling, asymmetry, or associated skin lesions. No contractures.  Skin & Extremity Inspection: Skin color, temperature, and hair growth are WNL. No peripheral edema or cyanosis. No masses, redness, swelling, asymmetry, or associated skin lesions. No contractures.  Functional ROM: Pain restricted ROM for shoulder  Functional ROM: Unrestricted ROM          Muscle Tone/Strength: Functionally intact. No obvious neuro-muscular anomalies detected.  Muscle Tone/Strength: Functionally intact. No obvious neuro-muscular anomalies detected.  Sensory (Neurological): Arthropathic arthralgia          Sensory (Neurological): Unimpaired          Palpation: Complains of area being tender to palpation              Palpation: No palpable  anomalies              Provocative Test(s):  Phalen's test: deferred Tinel's test: deferred Apley's scratch test (touch opposite shoulder):  Action 1 (Across chest): Decreased ROM Action 2 (Overhead): Decreased ROM Action 3 (LB reach): Decreased ROM   Provocative Test(s):  Phalen's test: deferred Tinel's test: deferred Apley's scratch test (touch opposite shoulder):  Action 1 (Across chest): deferred Action 2 (Overhead): deferred Action 3 (LB reach): deferred    Thoracic Spine Area Exam  Skin & Axial Inspection: No masses, redness, or swelling Alignment: Symmetrical Functional ROM: Unrestricted ROM Stability: No instability detected Muscle Tone/Strength: Functionally intact. No obvious neuro-muscular anomalies detected. Sensory (Neurological): Unimpaired Muscle strength & Tone: No palpable anomalies  Lumbar Spine Area Exam  Skin & Axial Inspection: No masses, redness, or swelling Alignment: Symmetrical Functional  ROM: Decreased ROM affecting both sides Stability: No instability detected Muscle Tone/Strength: Functionally intact. No obvious neuro-muscular anomalies detected. Sensory (Neurological): Musculoskeletal pain pattern Palpation: No palpable anomalies       Provocative Tests: Hyperextension/rotation test: Positive bilaterally for facet joint pain. Lumbar quadrant test (Kemp's test): (+) bilaterally for facet joint pain. Lateral bending test: deferred today       Patrick's Maneuver: deferred today                   FABER* test: deferred today                   S-I anterior distraction/compression test: deferred today         S-I lateral compression test: deferred today         S-I Thigh-thrust test: deferred today         S-I Gaenslen's test: deferred today         *(Flexion, ABduction and External Rotation)  Gait & Posture Assessment  Ambulation: Unassisted Gait: Relatively normal for age and body habitus Posture: WNL   Lower Extremity Exam    Side: Right  lower extremity  Side: Left lower extremity  Stability: No instability observed          Stability: No instability observed          Skin & Extremity Inspection: Skin color, temperature, and hair growth are WNL. No peripheral edema or cyanosis. No masses, redness, swelling, asymmetry, or associated skin lesions. No contractures.  Skin & Extremity Inspection: Skin color, temperature, and hair growth are WNL. No peripheral edema or cyanosis. No masses, redness, swelling, asymmetry, or associated skin lesions. No contractures.  Functional ROM: Unrestricted ROM                  Functional ROM: Unrestricted ROM                  Muscle Tone/Strength: Functionally intact. No obvious neuro-muscular anomalies detected.  Muscle Tone/Strength: Functionally intact. No obvious neuro-muscular anomalies detected.  Sensory (Neurological): Unimpaired        Sensory (Neurological): Unimpaired        DTR: Patellar: deferred today Achilles: deferred today Plantar: deferred today  DTR: Patellar: deferred today Achilles: deferred today Plantar: deferred today  Palpation: No palpable anomalies  Palpation: No palpable anomalies   Assessment  Primary Diagnosis & Pertinent Problem List: The primary encounter diagnosis was Spondylosis without myelopathy or radiculopathy, lumbar region. Diagnoses of Facet syndrome, lumbar, Chronic right shoulder pain, Disorder of right rotator cuff, and Arthritis were also pertinent to this visit.  Status Diagnosis  Having a Flare-up Having a Flare-up Having a Flare-up 1. Spondylosis without myelopathy or radiculopathy, lumbar region   2. Facet syndrome, lumbar   3. Chronic right shoulder pain   4. Disorder of right rotator cuff   5. Arthritis      Severe right shoulder pain that is been going on for the last 2 months and is progressively getting worse.  No inciting or traumatic event.  Will obtain x-ray of right shoulder.  Also discussed posterior shoulder steroid injection.  If  rotator cuff injury is suspected, patient may need MRI of right shoulder.  Discussed repeating lumbar facet medial branch nerve blocks for lumbar facet arthropathy and lumbar spondylosis.  Intramuscular Norflex and Toradol today given increased right shoulder pain.  Refill of tizanidine as below.  Plan of Care  Pharmacotherapy (Medications Ordered): Meds ordered this encounter  Medications  .  tiZANidine (ZANAFLEX) 4 MG capsule    Sig: Take 1 capsule (4 mg total) by mouth 2 (two) times daily as needed for muscle spasms.    Dispense:  60 capsule    Refill:  4    Do not place this medication, or any other prescription from our practice, on "Automatic Refill". Patient may have prescription filled one day early if pharmacy is closed on scheduled refill date.  . orphenadrine (NORFLEX) injection 30 mg  . ketorolac (TORADOL) 30 MG/ML injection 30 mg   Lab-work, procedure(s), and/or referral(s): Orders Placed This Encounter  Procedures  . LUMBAR FACET(MEDIAL BRANCH NERVE BLOCK) MBNB  . SHOULDER INJECTION  . DG Shoulder Right   Provider-requested follow-up: Return in about 2 weeks (around 10/07/2018) for Procedure.  Time Note: Greater than 50% of the 25 minute(s) of face-to-face time spent with Ms. Jose, was spent in counseling/coordination of care regarding: Ms. Dooling's primary cause of pain, the treatment plan, treatment alternatives, the risks and possible complications of proposed treatment, going over the informed consent, the results, interpretation and significance of  her recent diagnostic interventional treatment(s) and realistic expectations.  Future Appointments  Date Time Provider [place]  09/29/2018  9:45 AM Gillis Santa, MD [facility] None    Primary Care Physician: Mikey College, NP Location: [facility] Facility Note by: Gillis Santa, M.D Date: 09/23/2018; Time: 3:48 PM  Patient Instructions  A prescription was sent to your  pharmacy.  ____________________________________________________________________________________________  Preparing for Procedure with Sedation  Instructions: . Oral Intake: Do not eat or drink anything for at least 8 hours prior to your procedure. . Transportation: Public transportation is not allowed. Bring an adult driver. The driver must be physically present in our waiting room before any procedure can be started. Marland Kitchen Physical Assistance: Bring an adult physically capable of assisting you, in the event you need help. This adult should keep you company at home for at least 6 hours after the procedure. . Blood Pressure Medicine: Take your blood pressure medicine with a sip of water the morning of the procedure. . Blood thinners: Notify our staff if you are taking any blood thinners. Depending on which one you take, there will be specific instructions on how and when to stop it. . Diabetics on insulin: Notify the staff so that you can be scheduled 1st case in the morning. If your diabetes requires high dose insulin, take only  of your normal insulin dose the morning of the procedure and notify the staff that you have done so. . Preventing infections: Shower with an antibacterial soap the morning of your procedure. . Build-up your immune system: Take 1000 mg of Vitamin C with every meal (3 times a day) the day prior to your procedure. Marland Kitchen Antibiotics: Inform the staff if you have a condition or reason that requires you to take antibiotics before dental procedures. . Pregnancy: If you are pregnant, call and cancel the procedure. . Sickness: If you have a cold, fever, or any active infections, call and cancel the procedure. . Arrival: You must be in the facility at least 30 minutes prior to your scheduled procedure. . Children: Do not bring children with you. . Dress appropriately: Bring dark clothing that you would not mind if they get stained. . Valuables: Do not bring any jewelry or  valuables.  Procedure appointments are reserved for interventional treatments only. Marland Kitchen No Prescription Refills. . No medication changes will be discussed during procedure appointments. . No disability issues will  be discussed.  Reasons to call and reschedule or cancel your procedure: (Following these recommendations will minimize the risk of a serious complication.) . Surgeries: Avoid having procedures within 2 weeks of any surgery. (Avoid for 2 weeks before or after any surgery). . Flu Shots: Avoid having procedures within 2 weeks of a flu shots or . (Avoid for 2 weeks before or after immunizations). . Barium: Avoid having a procedure within 7-10 days after having had a radiological study involving the use of radiological contrast. (Myelograms, Barium swallow or enema study). . Heart attacks: Avoid any elective procedures or surgeries for the initial 6 months after a "Myocardial Infarction" (Heart Attack). . Blood thinners: It is imperative that you stop these medications before procedures. Let us know if you if you take any blood thinner.  . Infection: Avoid procedures during or within two weeks of an infection (including chest colds or gastrointestinal problems). Symptoms associated with infections include: Localized redness, fever, chills, night sweats or profuse sweating, burning sensation when voiding, cough, congestion, stuffiness, runny nose, sore throat, diarrhea, nausea, vomiting, cold or Flu symptoms, recent or current infections. It is specially important if the infection is over the area that we intend to treat. Marland Kitchen Heart and lung problems: Symptoms that may suggest an active cardiopulmonary problem include: cough, chest pain, breathing difficulties or shortness of breath, dizziness, ankle swelling, uncontrolled high or unusually low blood pressure, and/or palpitations. If you are experiencing any of these symptoms, cancel your procedure and contact your primary care physician for an  evaluation.  Remember:  Regular Business hours are:  Monday to Thursday 8:00 AM to 4:00 PM  Provider's Schedule: Milinda Pointer, MD:  Procedure days: Tuesday and Thursday 7:30 AM to 4:00 PM  Gillis Santa, MD:  Procedure days: Monday and Wednesday 7:30 AM to 4:00 PM ____________________________________________________________________________________________

## 2018-09-23 NOTE — Progress Notes (Signed)
Safety precautions to be maintained throughout the outpatient stay will include: orient to surroundings, keep bed in low position, maintain call bell within reach at all times, provide assistance with transfer out of bed and ambulation.  

## 2018-09-27 ENCOUNTER — Telehealth: Payer: Self-pay | Admitting: Student in an Organized Health Care Education/Training Program

## 2018-09-27 NOTE — Telephone Encounter (Signed)
Spoke with patient.  Arm xray report impression read to patient.  Patient states she wanted to know if he would be treating her shoulder.  Instructed patient that they could discuss it at appointment on Wednesday.

## 2018-09-27 NOTE — Telephone Encounter (Signed)
Pt states that she had xrays done last week and would like to know the results because she is having a procedure done Wednesday.

## 2018-09-29 ENCOUNTER — Ambulatory Visit (HOSPITAL_BASED_OUTPATIENT_CLINIC_OR_DEPARTMENT_OTHER): Payer: BLUE CROSS/BLUE SHIELD | Admitting: Student in an Organized Health Care Education/Training Program

## 2018-09-29 ENCOUNTER — Ambulatory Visit
Admission: RE | Admit: 2018-09-29 | Discharge: 2018-09-29 | Disposition: A | Discharge status: Home / Self Care | Payer: BLUE CROSS/BLUE SHIELD | Source: Ambulatory Visit | Attending: Student in an Organized Health Care Education/Training Program | Admitting: Student in an Organized Health Care Education/Training Program

## 2018-09-29 ENCOUNTER — Other Ambulatory Visit: Payer: Self-pay

## 2018-09-29 ENCOUNTER — Encounter: Payer: Self-pay | Admitting: Student in an Organized Health Care Education/Training Program

## 2018-09-29 VITALS — BP 107/58 | HR 70 | Temp 97.5°F | Resp 16 | Ht 62.0 in | Wt 240.0 lb

## 2018-09-29 DIAGNOSIS — M47816 Spondylosis without myelopathy or radiculopathy, lumbar region: Secondary | ICD-10-CM

## 2018-09-29 DIAGNOSIS — G8929 Other chronic pain: Secondary | ICD-10-CM | POA: Diagnosis not present

## 2018-09-29 DIAGNOSIS — M25511 Pain in right shoulder: Secondary | ICD-10-CM | POA: Insufficient documentation

## 2018-09-29 DIAGNOSIS — M67911 Unspecified disorder of synovium and tendon, right shoulder: Secondary | ICD-10-CM

## 2018-09-29 MED ORDER — ROPIVACAINE HCL 2 MG/ML IJ SOLN
10.0000 mL | Freq: Once | INTRAMUSCULAR | Status: AC
  Administered 2018-09-29: 10 mL

## 2018-09-29 MED ORDER — LACTATED RINGERS IV SOLN
1000.0000 mL | Freq: Once | INTRAVENOUS | Status: AC
  Administered 2018-09-29: 1000 mL via INTRAVENOUS

## 2018-09-29 MED ORDER — ROPIVACAINE HCL 2 MG/ML IJ SOLN
INTRAMUSCULAR | Status: AC
  Filled 2018-09-29: qty 10

## 2018-09-29 MED ORDER — LIDOCAINE HCL 2 % IJ SOLN
20.0000 mL | Freq: Once | INTRAMUSCULAR | Status: AC
  Administered 2018-09-29: 400 mg

## 2018-09-29 MED ORDER — FENTANYL CITRATE (PF) 100 MCG/2ML IJ SOLN
25.0000 ug | INTRAMUSCULAR | Status: DC | PRN
  Administered 2018-09-29: 75 ug via INTRAVENOUS

## 2018-09-29 MED ORDER — DEXAMETHASONE SODIUM PHOSPHATE 10 MG/ML IJ SOLN
INTRAMUSCULAR | Status: AC
  Filled 2018-09-29: qty 3

## 2018-09-29 MED ORDER — IOPAMIDOL (ISOVUE-M 200) INJECTION 41%
INTRAMUSCULAR | Status: AC
  Filled 2018-09-29: qty 10

## 2018-09-29 MED ORDER — DEXAMETHASONE SODIUM PHOSPHATE 10 MG/ML IJ SOLN
10.0000 mg | Freq: Once | INTRAMUSCULAR | Status: AC
  Administered 2018-09-29: 10 mg

## 2018-09-29 MED ORDER — LIDOCAINE HCL 2 % IJ SOLN
INTRAMUSCULAR | Status: AC
  Filled 2018-09-29: qty 20

## 2018-09-29 MED ORDER — FENTANYL CITRATE (PF) 100 MCG/2ML IJ SOLN
INTRAMUSCULAR | Status: AC
  Filled 2018-09-29: qty 2

## 2018-09-29 NOTE — Progress Notes (Signed)
Safety precautions to be maintained throughout the outpatient stay will include: orient to surroundings, keep bed in low position, maintain call bell within reach at all times, provide assistance with transfer out of bed and ambulation.  

## 2018-09-29 NOTE — Patient Instructions (Signed)

## 2018-09-29 NOTE — Progress Notes (Signed)
Patient's Name: Stacy Martinez  MRN: [id number]  Referring Provider: Mikey College, *  DOB: 1955-11-01  PCP: Mikey College, NP  DOS: 09/29/2018  Note by: Gillis Santa, MD  Service setting: Ambulatory outpatient  Specialty: Interventional Pain Management  Patient type: Established  Location: [facility] (AMB) Pain Management Facility  Visit type: Interventional Procedure   Primary Reason for Visit: Interventional Pain Management Treatment. CC: Back Pain (left > right) and Shoulder Pain (right)  Procedure:          Anesthesia, Analgesia, Anxiolysis:  Type: Diagnostic Glenohumeral Joint (shoulder) Injection #1  Primary Purpose: Diagnostic Region: Superior Shoulder Area Level:  Shoulder Target Area: Glenohumeral Joint (shoulder) Approach: Posterior approach. Laterality: Right-Sided  Type: Moderate (Conscious) Sedation combined with Local Anesthesia Indication(s): Analgesia and Anxiety Route: Intravenous (IV) IV Access: Secured Sedation: Meaningful verbal contact was maintained at all times during the procedure  Local Anesthetic: Lidocaine 1-2%  Position: Supine   Indications: RIGHT shoulder arthropathy  Pain Score: Pre-procedure: 4 /10 Post-procedure: 0-No pain/10  Pre-op Assessment:  Stacy Martinez is a 63 y.o. (year old), female patient, seen today for interventional treatment. She  has a past surgical history that includes Colon surgery; Hernia repair; and Wrist fracture surgery (Right). [name] has a current medication list which includes the following prescription(s): amlodipine, carvedilol, diclofenac, empagliflozin, enalapril, furosemide, gabapentin, glucose blood, basaglar kwikpen, pen needles, januvia, relion ultra thin lancets, rosuvastatin, and tizanidine, and the following Facility-Administered Medications: bupivacaine (pf), cefazolin, fentanyl, fentanyl, lactated ringers, lactated ringers, lactated ringers, lidocaine (pf), midazolam, orphenadrine, sodium chloride flush, and  triamcinolone acetonide. Her primarily concern today is the Back Pain (left > right) and Shoulder Pain (right)  Initial Vital Signs:  Pulse/HCG Rate: 69ECG Heart Rate: 63 Temp: 97.6 F (36.4 C) Resp: 18 BP: 131/63 SpO2: 100 %  BMI: Estimated body mass index is 43.9 kg/m as calculated from the following:   Height as of this encounter: 5\' 2"  (1.575 m).   Weight as of this encounter: 240 lb (108.9 kg).  Risk Assessment: Allergies: Reviewed. She is allergic to prednisone and metformin and related.  Allergy Precautions: None required Coagulopathies: Reviewed. None identified.  Blood-thinner therapy: None at this time Active Infection(s): Reviewed. None identified. Stacy Martinez is afebrile  Site Confirmation: [name] was asked to confirm the procedure and laterality before marking the site Procedure checklist: Completed Consent: Before the procedure and under the influence of no sedative(s), amnesic(s), or anxiolytics, the patient was informed of the treatment options, risks and possible complications. To fulfill our ethical and legal obligations, as recommended by the American Medical Association's Code of Ethics, I have informed the patient of my clinical impression; the nature and purpose of the treatment or procedure; the risks, benefits, and possible complications of the intervention; the alternatives, including doing nothing; the risk(s) and benefit(s) of the alternative treatment(s) or procedure(s); and the risk(s) and benefit(s) of doing nothing. The patient was provided information about the general risks and possible complications associated with the procedure. These may include, but are not limited to: failure to achieve desired goals, infection, bleeding, organ or nerve damage, allergic reactions, paralysis, and death. In addition, the patient was informed of those risks and complications associated to the procedure, such as failure to decrease pain; infection; bleeding; organ or nerve  damage with subsequent damage to sensory, motor, and/or autonomic systems, resulting in permanent pain, numbness, and/or weakness of one or several areas of the body; allergic reactions; (i.e.: anaphylactic reaction); and/or death. Furthermore, the  patient was informed of those risks and complications associated with the medications. These include, but are not limited to: allergic reactions (i.e.: anaphylactic or anaphylactoid reaction(s)); adrenal axis suppression; blood sugar elevation that in diabetics may result in ketoacidosis or comma; water retention that in patients with history of congestive heart failure may result in shortness of breath, pulmonary edema, and decompensation with resultant heart failure; weight gain; swelling or edema; medication-induced neural toxicity; particulate matter embolism and blood vessel occlusion with resultant organ, and/or nervous system infarction; and/or aseptic necrosis of one or more joints. Finally, the patient was informed that Medicine is not an exact science; therefore, there is also the possibility of unforeseen or unpredictable risks and/or possible complications that may result in a catastrophic outcome. The patient indicated having understood very clearly. We have given the patient no guarantees and we have made no promises. Enough time was given to the patient to ask questions, all of which were answered to the patient's satisfaction. Stacy Martinez has indicated that she wanted to continue with the procedure. Attestation: I, the ordering provider, attest that I have discussed with the patient the benefits, risks, side-effects, alternatives, likelihood of achieving goals, and potential problems during recovery for the procedure that I have provided informed consent. Date  Time: 09/29/2018  9:50 AM  Pre-Procedure Preparation:  Monitoring: As per clinic protocol. Respiration, ETCO2, SpO2, BP, heart rate and rhythm monitor placed and checked for adequate  function Safety Precautions: Patient was assessed for positional comfort and pressure points before starting the procedure. Time-out: I initiated and conducted the "Time-out" before starting the procedure, as per protocol. The patient was asked to participate by confirming the accuracy of the "Time Out" information. Verification of the correct person, site, and procedure were performed and confirmed by me, the nursing staff, and the patient. "Time-out" conducted as per Joint Commission's Universal Protocol (UP.01.01.01). Time: 1020  Description of Procedure:          Area Prepped: Entire shoulder Area Prepping solution: ChloraPrep (2% chlorhexidine gluconate and 70% isopropyl alcohol) Safety Precautions: Aspiration looking for blood return was conducted prior to all injections. At no point did we inject any substances, as a needle was being advanced. No attempts were made at seeking any paresthesias. Safe injection practices and needle disposal techniques used. Medications properly checked for expiration dates. SDV (single dose vial) medications used. Description of the Procedure: Protocol guidelines were followed. The patient was placed in position over the procedure table. The target area was identified and the area prepped in the usual manner. Skin & deeper tissues infiltrated with local anesthetic. Appropriate amount of time allowed to pass for local anesthetics to take effect. The procedure needles were then advanced to the target area. Proper needle placement secured. Negative aspiration confirmed. Solution injected in intermittent fashion, asking for systemic symptoms every 0.5cc of injectate. The needles were then removed and the area cleansed, making sure to leave some of the prepping solution back to take advantage of its long term bactericidal properties.    Vitals:   09/29/18 1100 09/29/18 1110 09/29/18 1121 09/29/18 1122  BP: (!) 115/58 116/60  (!) 107/58  Pulse:      Resp: 17 14  16    Temp: 97.7 F (36.5 C)  (!) 97.5 F (36.4 C)   TempSrc:      SpO2: 99% 97%  97%  Weight:      Height:        Start Time: 1020 hrs. End Time: 1050 hrs. Materials:  Needle(s) Type: Spinal Needle Gauge: 22G Length: 3.5-in Medication(s): Please see orders for medications and dosing details. 5 cc solution made of 4cc of 0.2% Ropivacaine and 1 cc of Decadron 10 m/cc injected into R Glenohumeral joint Imaging Guidance (Non-Spinal):          Type of Imaging Technique: Fluoroscopy Guidance (Non-Spinal) Indication(s): Assistance in needle guidance and placement for procedures requiring needle placement in or near specific anatomical locations not easily accessible without such assistance. Exposure Time: Please see nurses notes. Contrast: Before injecting any contrast, we confirmed that the patient did not have an allergy to iodine, shellfish, or radiological contrast. Once satisfactory needle placement was completed at the desired level, radiological contrast was injected. Contrast injected under live fluoroscopy. No contrast complications. See chart for type and volume of contrast used. Fluoroscopic Guidance: I was personally present during the use of fluoroscopy. "Tunnel Vision Technique" used to obtain the best possible view of the target area. Parallax error corrected before commencing the procedure. "Direction-depth-direction" technique used to introduce the needle under continuous pulsed fluoroscopy. Once target was reached, antero-posterior, oblique, and lateral fluoroscopic projection used confirm needle placement in all planes. Images permanently stored in EMR. Interpretation: I personally interpreted the imaging intraoperatively. Adequate needle placement confirmed in multiple planes. Appropriate spread of contrast into desired area was observed. No evidence of afferent or efferent intravascular uptake. Permanent images saved into the patient's record.  Antibiotic Prophylaxis:    Anti-infectives (From admission, onward)   None     Indication(s): None identified  Post-operative Assessment:  Post-procedure Vital Signs:  Pulse/HCG Rate: 7067 Temp: (!) 97.5 F (36.4 C) Resp: 16 BP: (!) 107/58 SpO2: 97 %  EBL: None  Complications: No immediate post-treatment complications observed by team, or reported by patient.  Note: The patient tolerated the entire procedure well. A repeat set of vitals were taken after the procedure and the patient was kept under observation following institutional policy, for this type of procedure. Post-procedural neurological assessment was performed, showing return to baseline, prior to discharge. The patient was provided with post-procedure discharge instructions, including a section on how to identify potential problems. Should any problems arise concerning this procedure, the patient was given instructions to immediately contact us, at any time, without hesitation. In any case, we plan to contact the patient by telephone for a follow-up status report regarding this interventional procedure.  Comments:  No additional relevant information.  Plan of Care   Imaging Orders     DG C-Arm 1-60 Min-No Report Procedure Orders    No procedure(s) ordered today    Medications ordered for procedure: Meds ordered this encounter  Medications  . lactated ringers infusion 1,000 mL  . fentaNYL (SUBLIMAZE) injection 25-100 mcg    Make sure Narcan is available in the pyxis when using this medication. In the event of respiratory depression (RR< 8/min): Titrate NARCAN (naloxone) in increments of 0.1 to 0.2 mg IV at 2-3 minute intervals, until desired degree of reversal.  . ropivacaine (PF) 2 mg/mL (0.2%) (NAROPIN) injection 10 mL  . lidocaine (XYLOCAINE) 2 % (with pres) injection 400 mg  . dexamethasone (DECADRON) injection 10 mg  . dexamethasone (DECADRON) injection 10 mg  . dexamethasone (DECADRON) injection 10 mg   Medications  administered: We administered lactated ringers, fentaNYL, ropivacaine (PF) 2 mg/mL (0.2%), lidocaine, dexamethasone, dexamethasone, and dexamethasone.  See the medical record for exact dosing, route, and time of administration.  Disposition: Discharge home  Discharge Date & Time: 09/29/2018; 1123 hrs.  Physician-requested Follow-up: Return in about 6 weeks (around 11/10/2018) for Post Procedure Evaluation.  Future Appointments  Date Time Provider [place]  11/10/2018  1:45 PM Gillis Santa, MD [facility] None   Primary Care Physician: Mikey College, NP Location: [facility] Note by: Gillis Santa, MD Date: 09/29/2018; Time: 12:02 PM  Disclaimer:  Medicine is not an exact science. The only guarantee in medicine is that nothing is guaranteed. It is important to note that the decision to proceed with this intervention was based on the information collected from the patient. The Data and conclusions were drawn from the patient's questionnaire, the interview, and the physical examination. Because the information was provided in large part by the patient, it cannot be guaranteed that it has not been purposely or unconsciously manipulated. Every effort has been made to obtain as much relevant data as possible for this evaluation. It is important to note that the conclusions that lead to this procedure are derived in large part from the available data. Always take into account that the treatment will also be dependent on availability of resources and existing treatment guidelines, considered by other Pain Management Practitioners as being common knowledge and practice, at the time of the intervention. For Medico-Legal purposes, it is also important to point out that variation in procedural techniques and pharmacological choices are the acceptable norm. The indications, contraindications, technique, and results of the above procedure should only be interpreted and  judged by a Board-Certified Interventional Pain Specialist with extensive familiarity and expertise in the same exact procedure and technique.

## 2018-09-29 NOTE — Progress Notes (Signed)
Patient's Name: Stacy Martinez  MRN: [id number]  Referring Provider: Mikey College, *  DOB: 1956/03/02  PCP: Mikey College, NP  DOS: 09/29/2018  Note by: Gillis Santa, MD  Service setting: Ambulatory outpatient  Specialty: Interventional Pain Management  Patient type: Established  Location: [facility] (AMB) Pain Management Facility  Visit type: Interventional Procedure   Primary Reason for Visit: Interventional Pain Management Treatment. CC: Back Pain (left > right) and Shoulder Pain (right)  Procedure:       Anesthesia, Analgesia, Anxiolysis:  Type: Lumbar Facet, Medial Branch Block(s)        #1 in 2020 (had 6 done in 2019, q2 months)  Primary Purpose: Therapeutic Region: Posterolateral Lumbosacral Spine Level: L2, L3, L4, L5,Medial Branch Level(s). Injecting these levels blocks the L3-4, L4-5, and L5-S1 lumbar facet joints. Laterality: Bilateral  Type: Moderate (Conscious) Sedation combined with Local Anesthesia Indication(s): Analgesia and Anxiety Route: Intravenous (IV) IV Access: Secured Sedation: Meaningful verbal contact was maintained at all times during the procedure  Local Anesthetic: Lidocaine 2%   Indications: 1. Spondylosis without myelopathy or radiculopathy, lumbar region   2. Facet syndrome, lumbar   3. Chronic right shoulder pain   4. Disorder of right rotator cuff    Pain Score: Pre-procedure: 4 /10 Post-procedure: 0-No pain/10  Pre-op Assessment:  Stacy Martinez is a 63 y.o. (year old), female patient, seen today for interventional treatment. She  has a past surgical history that includes Colon surgery; Hernia repair; and Wrist fracture surgery (Right). Stacy Martinez has a current medication list which includes the following prescription(s): amlodipine, carvedilol, diclofenac, empagliflozin, enalapril, furosemide, gabapentin, glucose blood, basaglar kwikpen, pen needles, januvia, relion ultra thin lancets, rosuvastatin, and tizanidine, and the following  Facility-Administered Medications: bupivacaine (pf), cefazolin, fentanyl, fentanyl, lactated ringers, lactated ringers, lactated ringers, lidocaine (pf), midazolam, orphenadrine, sodium chloride flush, and triamcinolone acetonide. Her primarily concern today is the Back Pain (left > right) and Shoulder Pain (right)  Initial Vital Signs:  Pulse/HCG Rate: 69ECG Heart Rate: 63 Temp: 97.6 F (36.4 C) Resp: 18 BP: 131/63 SpO2: 100 %  BMI: Estimated body mass index is 43.9 kg/m as calculated from the following:   Height as of this encounter: 5\' 2"  (8.099 m).   Weight as of this encounter: 240 lb (108.9 kg).  Risk Assessment: Allergies: Reviewed. She is allergic to prednisone and metformin and related.  Allergy Precautions: None required Coagulopathies: Reviewed. None identified.  Blood-thinner therapy: None at this time Active Infection(s): Reviewed. None identified. Stacy Martinez is afebrile  Site Confirmation: Stacy Martinez was asked to confirm the procedure and laterality before marking the site Procedure checklist: Completed Consent: Before the procedure and under the influence of no sedative(s), amnesic(s), or anxiolytics, the patient was informed of the treatment options, risks and possible complications. To fulfill our ethical and legal obligations, as recommended by the American Medical Association's Code of Ethics, I have informed the patient of my clinical impression; the nature and purpose of the treatment or procedure; the risks, benefits, and possible complications of the intervention; the alternatives, including doing nothing; the risk(s) and benefit(s) of the alternative treatment(s) or procedure(s); and the risk(s) and benefit(s) of doing nothing. The patient was provided information about the general risks and possible complications associated with the procedure. These may include, but are not limited to: failure to achieve desired goals, infection, bleeding, organ or nerve damage, allergic  reactions, paralysis, and death. In addition, the patient was informed of those risks and complications associated to [facility]  procedures, such as failure to decrease pain; infection (i.e.: Meningitis, epidural or intraspinal abscess); bleeding (i.e.: epidural hematoma, subarachnoid hemorrhage, or any other type of intraspinal or peri-dural bleeding); organ or nerve damage (i.e.: Any type of peripheral nerve, nerve root, or spinal cord injury) with subsequent damage to sensory, motor, and/or autonomic systems, resulting in permanent pain, numbness, and/or weakness of one or several areas of the body; allergic reactions; (i.e.: anaphylactic reaction); and/or death. Furthermore, the patient was informed of those risks and complications associated with the medications. These include, but are not limited to: allergic reactions (i.e.: anaphylactic or anaphylactoid reaction(s)); adrenal axis suppression; blood sugar elevation that in diabetics may result in ketoacidosis or comma; water retention that in patients with history of congestive heart failure may result in shortness of breath, pulmonary edema, and decompensation with resultant heart failure; weight gain; swelling or edema; medication-induced neural toxicity; particulate matter embolism and blood vessel occlusion with resultant organ, and/or nervous system infarction; and/or aseptic necrosis of one or more joints. Finally, the patient was informed that Medicine is not an exact science; therefore, there is also the possibility of unforeseen or unpredictable risks and/or possible complications that may result in a catastrophic outcome. The patient indicated having understood very clearly. We have given the patient no guarantees and we have made no promises. Enough time was given to the patient to ask questions, all of which were answered to the patient's satisfaction. Stacy Martinez has indicated that she wanted to continue with the procedure. Attestation: I,  the ordering provider, attest that I have discussed with the patient the benefits, risks, side-effects, alternatives, likelihood of achieving goals, and potential problems during recovery for the procedure that I have provided informed consent. Date  Time: 09/29/2018  9:53 AM  Pre-Procedure Preparation:  Monitoring: As per clinic protocol. Respiration, ETCO2, SpO2, BP, heart rate and rhythm monitor placed and checked for adequate function Safety Precautions: Patient was assessed for positional comfort and pressure points before starting the procedure. Time-out: I initiated and conducted the "Time-out" before starting the procedure, as per protocol. The patient was asked to participate by confirming the accuracy of the "Time Out" information. Verification of the correct person, site, and procedure were performed and confirmed by me, the nursing staff, and the patient. "Time-out" conducted as per Joint Commission's Universal Protocol (UP.01.01.01). Time: 1020  Description of Procedure:       Position: Prone Laterality: Bilateral. The procedure was performed in identical fashion on both sides. Levels:  L2, L3, L4, L5, Medial Branch Level(s) Area Prepped: Posterior Lumbosacral Region Prepping solution: ChloraPrep (2% chlorhexidine gluconate and 70% isopropyl alcohol) Safety Precautions: Aspiration looking for blood return was conducted prior to all injections. At no point did we inject any substances, as a needle was being advanced. Before injecting, the patient was told to immediately notify me if she was experiencing any new onset of "ringing in the ears, or metallic taste in the mouth". No attempts were made at seeking any paresthesias. Safe injection practices and needle disposal techniques used. Medications properly checked for expiration dates. SDV (single dose vial) medications used. After the completion of the procedure, all disposable equipment used was discarded in the proper designated medical  waste containers. Local Anesthesia: Protocol guidelines were followed. The patient was positioned over the fluoroscopy table. The area was prepped in the usual manner. The time-out was completed. The target area was identified using fluoroscopy. A 12-in long, straight, sterile hemostat was used with fluoroscopic guidance to locate the targets  for each level blocked. Once located, the skin was marked with an approved surgical skin marker. Once all sites were marked, the skin (epidermis, dermis, and hypodermis), as well as deeper tissues (fat, connective tissue and muscle) were infiltrated with a small amount of a short-acting local anesthetic, loaded on a 10cc syringe with a 25G, 1.5-in  Needle. An appropriate amount of time was allowed for local anesthetics to take effect before proceeding to the next step. Local Anesthetic: Lidocaine 2.0% The unused portion of the local anesthetic was discarded in the proper designated containers.  Technical explanation of process:  L2 Medial Branch Nerve Block (MBB): The target area for the L2 medial branch is at the junction of the postero-lateral aspect of the superior articular process and the superior, posterior, and medial edge of the transverse process of L3. Under fluoroscopic guidance, a Quincke needle was inserted until contact was made with os over the superior postero-lateral aspect of the pedicular shadow (target area). After negative aspiration for blood, 1 mL of the nerve block solution was injected without difficulty or complication. The needle was removed intact. L3 Medial Branch Nerve Block (MBB): The target area for the L3 medial branch is at the junction of the postero-lateral aspect of the superior articular process and the superior, posterior, and medial edge of the transverse process of L4. Under fluoroscopic guidance, a Quincke needle was inserted until contact was made with os over the superior postero-lateral aspect of the pedicular shadow (target  area). After negative aspiration for blood, 77mL of the nerve block solution was injected without difficulty or complication. The needle was removed intact. L4 Medial Branch Nerve Block (MBB): The target area for the L4 medial branch is at the junction of the postero-lateral aspect of the superior articular process and the superior, posterior, and medial edge of the transverse process of L5. Under fluoroscopic guidance, a Quincke needle was inserted until contact was made with os over the superior postero-lateral aspect of the pedicular shadow (target area). After negative aspiration for blood, 1 mL of the nerve block solution was injected without difficulty or complication. The needle was removed intact. L5 Medial Branch Nerve Block (MBB): The target area for the L5 medial branch is at the junction of the postero-lateral aspect of the superior articular process and the superior, posterior, and medial edge of the sacral ala. Under fluoroscopic guidance, a Quincke needle was inserted until contact was made with os over the superior postero-lateral aspect of the pedicular shadow (target area). After negative aspiration for blood,1  mL of the nerve block solution was injected without difficulty or complication. The needle was removed intact.  Procedural Needles: 22-gauge, 3.5-inch, Quincke needles used for all levels. Nerve block solution: 10 cc solution made of 9 cc of 0.2% ropivacaine, 1 cc of Decadron 10 mg/cc.  1 to 1.5 cc injected at each level bilaterally.  The unused portion of the solution was discarded in the proper designated containers.  Once the entire procedure was completed, the treated area was cleaned, making sure to leave some of the prepping solution back to take advantage of its long term bactericidal properties.   Illustration of the posterior view of the lumbar spine and the posterior neural structures. Laminae of L2 through S1 are labeled. DPRL5, dorsal primary ramus of L5; DPRS1, dorsal  primary ramus of S1; DPR3, dorsal primary ramus of L3; FJ, facet (zygapophyseal) joint L3-L4; I, inferior articular process of L4; LB1, lateral branch of dorsal primary ramus of L1; IAB,  inferior articular branches from L3 medial branch (supplies L4-L5 facet joint); IBP, intermediate branch plexus; MB3, medial branch of dorsal primary ramus of L3; NR3, third lumbar nerve root; S, superior articular process of L5; SAB, superior articular branches from L4 (supplies L4-5 facet joint also); TP3, transverse process of L3.  Vitals:   09/29/18 1100 09/29/18 1110 09/29/18 1121 09/29/18 1122  BP: (!) 115/58 116/60  (!) 107/58  Pulse:      Resp: 17 14  16   Temp: 97.7 F (36.5 C)  (!) 97.5 F (36.4 C)   TempSrc:      SpO2: 99% 97%  97%  Weight:      Height:        Start Time: 1020 hrs. End Time: 1050 hrs.  Imaging Guidance (Spinal):  Type of Imaging Technique: Fluoroscopy Guidance (Spinal) Indication(s): Assistance in needle guidance and placement for procedures requiring needle placement in or near specific anatomical locations not easily accessible without such assistance. Exposure Time: Please see nurses notes. Contrast: None used. Fluoroscopic Guidance: I was personally present during the use of fluoroscopy. "Tunnel Vision Technique" used to obtain the best possible view of the target area. Parallax error corrected before commencing the procedure. "Direction-depth-direction" technique used to introduce the needle under continuous pulsed fluoroscopy. Once target was reached, antero-posterior, oblique, and lateral fluoroscopic projection used confirm needle placement in all planes. Images permanently stored in EMR. Interpretation: No contrast injected. I personally interpreted the imaging intraoperatively. Adequate needle placement confirmed in multiple planes. Permanent images saved into the patient's record.  Antibiotic Prophylaxis:   Anti-infectives (From admission, onward)   None      Indication(s): None identified  Post-operative Assessment:  Post-procedure Vital Signs:  Pulse/HCG Rate: 7067 Temp: (!) 97.5 F (36.4 C) Resp: 16 BP: (!) 107/58 SpO2: 97 %  EBL: None  Complications: No immediate post-treatment complications observed by team, or reported by patient.  Note: The patient tolerated the entire procedure well. A repeat set of vitals were taken after the procedure and the patient was kept under observation following institutional policy, for this type of procedure. Post-procedural neurological assessment was performed, showing return to baseline, prior to discharge. The patient was provided with post-procedure discharge instructions, including a section on how to identify potential problems. Should any problems arise concerning this procedure, the patient was given instructions to immediately contact us, at any time, without hesitation. In any case, we plan to contact the patient by telephone for a follow-up status report regarding this interventional procedure.  Comments:  No additional relevant information. 5 out of 5 strength bilateral lower extremity: Plantar flexion, dorsiflexion, knee flexion, knee extension.  Plan of Care    Imaging Orders     DG C-Arm 1-60 Min-No Report  Procedure Orders    No procedure(s) ordered today    Medications ordered for procedure: Meds ordered this encounter  Medications  . lactated ringers infusion 1,000 mL  . fentaNYL (SUBLIMAZE) injection 25-100 mcg    Make sure Narcan is available in the pyxis when using this medication. In the event of respiratory depression (RR< 8/min): Titrate NARCAN (naloxone) in increments of 0.1 to 0.2 mg IV at 2-3 minute intervals, until desired degree of reversal.  . ropivacaine (PF) 2 mg/mL (0.2%) (NAROPIN) injection 10 mL  . lidocaine (XYLOCAINE) 2 % (with pres) injection 400 mg  . dexamethasone (DECADRON) injection 10 mg  . dexamethasone (DECADRON) injection 10 mg  . dexamethasone  (DECADRON) injection 10 mg   Medications administered: We administered  lactated ringers, fentaNYL, ropivacaine (PF) 2 mg/mL (0.2%), lidocaine, dexamethasone, dexamethasone, and dexamethasone.  See the medical record for exact dosing, route, and time of administration.  New Prescriptions   No medications on file   Disposition: Discharge home  Discharge Date & Time: 09/29/2018; 1123 hrs.   Physician-requested Follow-up: Return in about 6 weeks (around 11/10/2018) for Post Procedure Evaluation.  Future Appointments  Date Time Provider [place]  11/10/2018  1:45 PM Gillis Santa, MD [facility] None   Primary Care Physician: Mikey College, NP Location: [facility] Management Facility Note by: Gillis Santa, MD Date: 09/29/2018; Time: 12:01 PM  Disclaimer:  Medicine is not an exact science. The only guarantee in medicine is that nothing is guaranteed. It is important to note that the decision to proceed with this intervention was based on the information collected from the patient. The Data and conclusions were drawn from the patient's questionnaire, the interview, and the physical examination. Because the information was provided in large part by the patient, it cannot be guaranteed that it has not been purposely or unconsciously manipulated. Every effort has been made to obtain as much relevant data as possible for this evaluation. It is important to note that the conclusions that lead to this procedure are derived in large part from the available data. Always take into account that the treatment will also be dependent on availability of resources and existing treatment guidelines, considered by other Pain Management Practitioners as being common knowledge and practice, at the time of the intervention. For Medico-Legal purposes, it is also important to point out that variation in procedural techniques and pharmacological choices are the acceptable norm. The indications,  contraindications, technique, and results of the above procedure should only be interpreted and judged by a Board-Certified Interventional Pain Specialist with extensive familiarity and expertise in the same exact procedure and technique.

## 2018-09-30 ENCOUNTER — Telehealth: Payer: Self-pay | Admitting: *Deleted

## 2018-09-30 NOTE — Telephone Encounter (Signed)
Spoke with patient re; procedure on yesterday, no questions or concerns.  

## 2018-10-14 ENCOUNTER — Telehealth: Payer: Self-pay | Admitting: Student in an Organized Health Care Education/Training Program

## 2018-10-14 MED ORDER — METHOCARBAMOL 500 MG PO TABS: 500.0000 mg | ORAL_TABLET | Freq: Three times a day (TID) | ORAL | 2 refills | Status: DC | PRN

## 2018-10-14 NOTE — Telephone Encounter (Signed)
Pt left a voice mail stating that her pharmacy told her that her insurance is not going to cover her prescription of tazanidine and she will need something else.

## 2018-10-14 NOTE — Telephone Encounter (Signed)
Do you wish to change this?

## 2018-10-14 NOTE — Telephone Encounter (Signed)
Patient notified Robaxin has been sent to pharmacy, instructed on how to take.

## 2018-10-24 ENCOUNTER — Other Ambulatory Visit: Payer: Self-pay | Admitting: Nurse Practitioner

## 2018-10-24 DIAGNOSIS — I10 Essential (primary) hypertension: Secondary | ICD-10-CM

## 2018-10-25 ENCOUNTER — Other Ambulatory Visit: Payer: Self-pay | Admitting: Nurse Practitioner

## 2018-10-25 DIAGNOSIS — E1142 Type 2 diabetes mellitus with diabetic polyneuropathy: Secondary | ICD-10-CM

## 2018-10-26 ENCOUNTER — Other Ambulatory Visit: Payer: Self-pay

## 2018-10-26 ENCOUNTER — Encounter: Payer: Self-pay | Admitting: Nurse Practitioner

## 2018-10-26 ENCOUNTER — Ambulatory Visit (INDEPENDENT_AMBULATORY_CARE_PROVIDER_SITE_OTHER): Payer: BLUE CROSS/BLUE SHIELD | Admitting: Nurse Practitioner

## 2018-10-26 ENCOUNTER — Other Ambulatory Visit: Payer: Self-pay | Admitting: Nurse Practitioner

## 2018-10-26 VITALS — BP 120/50 | HR 66 | Temp 98.1°F | Ht 62.0 in | Wt 248.0 lb

## 2018-10-26 DIAGNOSIS — H1033 Unspecified acute conjunctivitis, bilateral: Secondary | ICD-10-CM | POA: Diagnosis not present

## 2018-10-26 DIAGNOSIS — E1142 Type 2 diabetes mellitus with diabetic polyneuropathy: Secondary | ICD-10-CM

## 2018-10-26 MED ORDER — SITAGLIPTIN PHOSPHATE 100 MG PO TABS: ORAL_TABLET | ORAL | 0 refills | Status: DC

## 2018-10-26 MED ORDER — TOBRAMYCIN 0.3 % OP SOLN: 1.0000 [drp] | OPHTHALMIC | 0 refills | Status: AC

## 2018-10-26 MED ORDER — EMPAGLIFLOZIN 25 MG PO TABS: 25.0000 mg | ORAL_TABLET | Freq: Every day | ORAL | 0 refills | Status: DC

## 2018-10-26 MED ORDER — BASAGLAR KWIKPEN 100 UNIT/ML ~~LOC~~ SOPN: 25.0000 [IU] | PEN_INJECTOR | Freq: Every day | SUBCUTANEOUS | 0 refills | Status: DC

## 2018-10-26 NOTE — Patient Instructions (Addendum)
Stacy Martinez,   Thank you for coming in to clinic today.  1. Viral or bacterial conjunctivitis (pink eye) - Practice good hand hygiene.  2. Use tobramycin eye drops every 4 hours for 5 days.  Extend to 7 days if needed.  Please schedule a follow-up appointment with Cassell Smiles, AGNP.  Return if symptoms worsen or fail to improve.  If you have any other questions or concerns, please feel free to call the clinic or send a message through [place]. You may also schedule an earlier appointment if necessary.  You will receive a survey after today's visit either digitally by e-mail or paper by [employer]. Your experiences and feedback matter to [place].  Please respond so we know how we are doing as we provide care for you.   Cassell Smiles, DNP, AGNP-BC Adult Gerontology Nurse Practitioner [facility], [facility]   Bacterial Conjunctivitis, Adult Bacterial conjunctivitis is an infection of the clear membrane that covers the white part of your eye and the inner surface of your eyelid (conjunctiva). When the blood vessels in your conjunctiva become inflamed, your eye becomes red or pink, and it will probably feel itchy. Bacterial conjunctivitis spreads very easily from person to person (is contagious). It also spreads easily from one eye to the other eye. What are the causes? This condition is caused by bacteria. You may get the infection if you come into close contact with:  A person who is infected with the bacteria.  Items that are contaminated with the bacteria, such as a face towel, contact lens solution, or eye makeup. What increases the risk? You are more likely to develop this condition if you:  Are exposed to other people who have the infection.  Wear contact lenses.  Have a sinus infection.  Have had a recent eye injury or surgery.  Have a weak body defense system (immune system).  Have a medical condition that causes dry eyes. What are the signs or  symptoms? Symptoms of this condition include:  Thick, yellowish discharge from the eye. This may turn into a crust on the eyelid overnight and cause your eyelids to stick together.  Tearing or watery eyes.  Itchy eyes.  Burning feeling in your eyes.  Eye redness.  Swollen eyelids.  Blurred vision. How is this diagnosed? This condition is diagnosed based on your symptoms and medical history. Your health care provider may also take a sample of discharge from your eye to find the cause of your infection. This is rarely done. How is this treated? This condition may be treated with:  Antibiotic eye drops or ointment to clear the infection more quickly and prevent the spread of infection to others.  Oral antibiotic medicines to treat infections that do not respond to drops or ointments or that last longer than 10 days.  Cool, wet cloths (cool compresses) placed on the eyes.  Artificial tears applied 2-6 times a day. Follow these instructions at home: Medicines  Take or apply your antibiotic medicine as told by your health care provider. Do not stop taking or applying the antibiotic even if you start to feel better.  Take or apply over-the-counter and prescription medicines only as told by your health care provider.  Be very careful to avoid touching the edge of your eyelid with the eye-drop bottle or the ointment tube when you apply medicines to the affected eye. This will keep you from spreading the infection to your other eye or to other people. Managing discomfort  Gently wipe away any drainage from your eye with a warm, wet washcloth or a cotton ball.  Apply a clean, cool compress to your eye for 10-20 minutes, 3-4 times a day. General instructions  Do not wear contact lenses until the inflammation is gone and your health care provider says it is safe to wear them again. Ask your health care provider how to sterilize or replace your contact lenses before you use them again.  Wear glasses until you can resume wearing contact lenses.  Avoid wearing eye makeup until the inflammation is gone. Throw away any old eye cosmetics that may be contaminated.  Change or wash your pillowcase every day.  Do not share towels or washcloths. This may spread the infection.  Wash your hands often with soap and water. Use paper towels to dry your hands.  Avoid touching or rubbing your eyes.  Do not drive or use heavy machinery if your vision is blurred. Contact a health care provider if:  You have a fever.  Your symptoms do not get better after 10 days. Get help right away if you have:  A fever and your symptoms suddenly get worse.  Severe pain when you move your eye.  Facial pain, redness, or swelling.  Sudden loss of vision. Summary  Bacterial conjunctivitis is an infection of the clear membrane that covers the white part of your eye and the inner surface of your eyelid (conjunctiva).  Bacterial conjunctivitis spreads very easily from person to person (is contagious).  Wash your hands often with soap and water. Use paper towels to dry your hands.  Take or apply your antibiotic medicine as told by your health care provider. Do not stop taking or applying the antibiotic even if you start to feel better.  Contact a health care provider if you have a fever or your symptoms do not get better after 10 days. This information is not intended to replace advice given to you by your health care provider. Make sure you discuss any questions you have with your health care provider. Document Released: 08/18/2005 Document Revised: 03/24/2018 Document Reviewed: 03/24/2018 Elsevier Interactive Patient Education  2019 [employer].

## 2018-10-26 NOTE — Progress Notes (Signed)
Subjective:    Patient ID: Stacy Martinez, female    DOB: Aug 01, 1956, 63 y.o.   MRN: [id number]  Stacy Martinez is a 64 y.o. female presenting on 10/26/2018 for Sinus Problem (possible pinkeye, pt was exposed. Swollen Rt eye w/ drainage. Nasal congestion and nasal drainage x 1 day) and Conjunctivitis (Rt eye. swollen, sensitivity to light. Exposed to pink eye x 1 week.)   HPI Eye pain/swelling Patient did have crusted over eyelids this morning. - Patient is having increased nasal drainage, rhinorrhea, headache mild, and light sensitivity are only other symptoms. R>L - Has had positive exposure to someone at work who also had pink eye in the last week.  Social History   Tobacco Use  . Smoking status: Never Smoker  . Smokeless tobacco: Never Used  Substance Use Topics  . Alcohol use: No    Alcohol/week: 0.0 standard drinks  . Drug use: No    Review of Systems Per HPI unless specifically indicated above     Objective:    BP (!) 120/50 (BP Location: Left Arm, Patient Position: Sitting, Cuff Size: Large)   Pulse 66   Temp 98.1 F (36.7 C) (Oral)   Ht 5\' 2"  (1.575 m)   Wt 248 lb (112.5 kg)   BMI 45.36 kg/m   Wt Readings from Last 3 Encounters:  10/26/18 248 lb (112.5 kg)  09/29/18 240 lb (108.9 kg)  09/23/18 240 lb (108.9 kg)    Physical Exam Vitals signs reviewed.  Constitutional:      General: She is not in acute distress.    Appearance: She is well-developed.  HENT:     Head: Normocephalic and atraumatic.     Right Ear: Hearing, tympanic membrane, ear canal and external ear normal.     Left Ear: Hearing, tympanic membrane, ear canal and external ear normal.     Nose: Nose normal.     Mouth/Throat:     Lips: Pink.     Mouth: Mucous membranes are moist.     Pharynx: Oropharynx is clear. Uvula midline. No oropharyngeal exudate (clear secretions) or uvula swelling.     Tonsils: No tonsillar exudate. Swelling: 0 on the right. 0 on the left.  Eyes:     General: Lids are  normal. Lids are everted, no foreign bodies appreciated.        Right eye: Discharge (increased clear) present.        Left eye: Discharge (increased clear) present.    Extraocular Movements: Extraocular movements intact.     Conjunctiva/sclera:     Right eye: Right conjunctiva is injected. No chemosis, exudate or hemorrhage.    Left eye: Left conjunctiva is injected. No chemosis, exudate or hemorrhage.    Pupils: Pupils are equal, round, and reactive to light.  Neck:     Musculoskeletal: Full passive range of motion without pain, normal range of motion and neck supple.  Cardiovascular:     Rate and Rhythm: Normal rate and regular rhythm.     Pulses:          Radial pulses are 2+ on the right side and 2+ on the left side.       Posterior tibial pulses are 1+ on the right side and 1+ on the left side.     Heart sounds: Normal heart sounds, S1 normal and S2 normal.  Pulmonary:     Effort: Pulmonary effort is normal. No respiratory distress.     Breath sounds: Normal breath sounds and  air entry.  Musculoskeletal:     Right lower leg: No edema.     Left lower leg: No edema.  Lymphadenopathy:     Cervical: No cervical adenopathy.  Skin:    General: Skin is warm and dry.     Capillary Refill: Capillary refill takes less than 2 seconds.  Neurological:     General: No focal deficit present.     Mental Status: She is alert and oriented to person, place, and time.     GCS: GCS eye subscore is 4. GCS verbal subscore is 5. GCS motor subscore is 6.  Psychiatric:        Attention and Perception: Attention normal.        Mood and Affect: Mood and affect normal.        Behavior: Behavior normal. Behavior is cooperative.        Thought Content: Thought content normal.        Judgment: Judgment normal.       Assessment & Plan:   Problem List Items Addressed This Visit    None    Visit Diagnoses    Acute bacterial conjunctivitis of both eyes    -  Primary   Relevant Medications    tobramycin (TOBREX) 0.3 % ophthalmic solution      Acute bilateral conjunctivitis for past 2 days, with mild scleral/conjunctival injection with clear discharge actively. Suspected viral vs bacterial etiology with sick contact and mild associated URI symptoms.  Not likely allergic. - Normal visual acuity in office - No evidence of complication, foreign body, or extending eyelid / pre-septal cellulitis - Adequate conservative treatment at home  Plan: 1. Reassurance, most likely self limited, empiric coverage with Start tobramycin 1 drop in bilateral eyes every 4 hours for 5-7 days 2. Start moist warm compresses over bilateral eyelids 10-15 min at a time, up to 4-6 times daily until resolution 3. Frequent hand washing, avoid rubbing / scratching eye 4. Strict return precautions for spreading infection 5. Follow-up 1-2 weeks as needed   Meds ordered this encounter  Medications  . tobramycin (TOBREX) 0.3 % ophthalmic solution    Sig: Place 1 drop into both eyes every 4 (four) hours for 5 days.    Dispense:  5 mL    Refill:  0    Order Specific Question:   Supervising Provider    Answer:   Olin Hauser [[id number]]    Follow up plan: Return if symptoms worsen or fail to improve, for ALSO schedule in about 4 weeks for diabetes recheck.  Stacy Smiles, DNP, AGPCNP-BC Adult Gerontology Primary Care Nurse Practitioner [facility] 10/26/2018, 11:56 AM

## 2018-10-29 ENCOUNTER — Encounter: Payer: Self-pay | Admitting: Nurse Practitioner

## 2018-11-05 ENCOUNTER — Other Ambulatory Visit: Payer: Self-pay | Admitting: Nurse Practitioner

## 2018-11-05 DIAGNOSIS — I10 Essential (primary) hypertension: Secondary | ICD-10-CM

## 2018-11-10 ENCOUNTER — Encounter: Payer: Self-pay | Admitting: Student in an Organized Health Care Education/Training Program

## 2018-11-10 ENCOUNTER — Other Ambulatory Visit: Payer: Self-pay

## 2018-11-10 ENCOUNTER — Ambulatory Visit
Payer: BLUE CROSS/BLUE SHIELD | Attending: Student in an Organized Health Care Education/Training Program | Admitting: Student in an Organized Health Care Education/Training Program

## 2018-11-10 VITALS — BP 125/65 | HR 75 | Temp 98.3°F | Resp 16 | Ht 62.0 in | Wt 248.0 lb

## 2018-11-10 DIAGNOSIS — M47816 Spondylosis without myelopathy or radiculopathy, lumbar region: Secondary | ICD-10-CM | POA: Diagnosis not present

## 2018-11-10 DIAGNOSIS — M7581 Other shoulder lesions, right shoulder: Secondary | ICD-10-CM

## 2018-11-10 DIAGNOSIS — M67911 Unspecified disorder of synovium and tendon, right shoulder: Secondary | ICD-10-CM

## 2018-11-10 DIAGNOSIS — M25511 Pain in right shoulder: Secondary | ICD-10-CM | POA: Diagnosis not present

## 2018-11-10 DIAGNOSIS — M778 Other enthesopathies, not elsewhere classified: Secondary | ICD-10-CM | POA: Insufficient documentation

## 2018-11-10 DIAGNOSIS — G8929 Other chronic pain: Secondary | ICD-10-CM

## 2018-11-10 NOTE — Progress Notes (Signed)
Safety precautions to be maintained throughout the outpatient stay will include: orient to surroundings, keep bed in low position, maintain call bell within reach at all times, provide assistance with transfer out of bed and ambulation.  

## 2018-11-10 NOTE — Progress Notes (Signed)
Patient's Name: Stacy Martinez  MRN: [id number]  Referring Provider: Mikey College, *  DOB: 06-Jul-1956  PCP: Mikey College, NP  DOS: 11/10/2018  Note by: Gillis Santa, MD  Service setting: Ambulatory outpatient  Specialty: Interventional Pain Management  Location: [facility] (AMB) Pain Management Facility    Patient type: Established   Primary Reason(s) for Visit: Encounter for post-procedure evaluation of chronic illness with mild to moderate exacerbation CC: Shoulder Pain (right) and Back Pain (lower)  HPI  Stacy Martinez is a 63 y.o. year old, female patient, who comes today for a post-procedure evaluation. She has Bowel obstruction ([place]); BP (high blood pressure); DM type 2 with diabetic peripheral neuropathy ([place]); Wound of skin; Hyperlipidemia associated with type 2 diabetes mellitus ([place]); DDD (degenerative disc disease), lumbar; Cardiomyopathy ([place]); Arthritis; Facet syndrome, lumbar; Diabetic neuropathy ([facility]); DJD (degenerative joint disease) of knee; Sacroiliac joint dysfunction; Disorder of right rotator cuff; Chronic right shoulder pain; and Right shoulder tendonitis on their problem list. Her primarily concern today is the Shoulder Pain (right) and Back Pain (lower)  Pain Assessment: Location: Right Shoulder(and lower back) Radiating: down right arm to wrist Onset: More than a month ago Duration: Chronic pain Quality: Throbbing Severity: 4 (right shoulder/lower back)/10 (subjective, self-reported pain score)  Note: Reported level is compatible with observation.                         When using our objective Pain Scale, levels between 6 and 10/10 are said to belong in an emergency room, as it progressively worsens from a 6/10, described as severely limiting, requiring emergency care not usually available at an outpatient pain management facility. At a 6/10 level, communication becomes difficult and requires great effort. Assistance to reach the emergency department may be required.  Facial flushing and profuse sweating along with potentially dangerous increases in heart rate and blood pressure will be evident. Effect on ADL: limits daily activities Timing: Constant Modifying factors: rest, meds BP: 125/65  HR: 75  Stacy Martinez comes in today for post-procedure evaluation.  Further details on both, my assessment(s), as well as the proposed treatment plan, please see below.  Post-Procedure Assessment  10/14/2018 Procedure: Bilateral lumbar facet medial branch nerve blocks at L2, L3, L4, L5, right shoulder steroid injection Pre-procedure pain score:  4/10 Post-procedure pain score: 0/10         Influential Factors: BMI: 45.36 kg/m Intra-procedural challenges: None observed.         Assessment challenges: None detected.              Reported side-effects: None.        Post-procedural adverse reactions or complications: None reported         Sedation: Please see nurses note. When no sedatives are used, the analgesic levels obtained are directly associated to the effectiveness of the local anesthetics. However, when sedation is provided, the level of analgesia obtained during the initial 1 hour following the intervention, is believed to be the result of a combination of factors. These factors may include, but are not limited to: 1. The effectiveness of the local anesthetics used. 2. The effects of the analgesic(s) and/or anxiolytic(s) used. 3. The degree of discomfort experienced by the patient at the time of the procedure. 4. The patients ability and reliability in recalling and recording the events. 5. The presence and influence of possible secondary gains and/or psychosocial factors. Reported result: Relief experienced during the 1st hour after the  procedure: 100 % (Ultra-Short Term Relief)            Interpretative annotation: Clinically appropriate result. Analgesia during this period is likely to be Local Anesthetic and/or IV Sedative (Analgesic/Anxiolytic) related.           Effects of local anesthetic: The analgesic effects attained during this period are directly associated to the localized infiltration of local anesthetics and therefore cary significant diagnostic value as to the etiological location, or anatomical origin, of the pain. Expected duration of relief is directly dependent on the pharmacodynamics of the local anesthetic used. Long-acting (4-6 hours) anesthetics used.  Reported result: Relief during the next 4 to 6 hour after the procedure: 100 % (Short-Term Relief)            Interpretative annotation: Clinically appropriate result. Analgesia during this period is likely to be Local Anesthetic-related.          Long-term benefit: Defined as the period of time past the expected duration of local anesthetics (1 hour for short-acting and 4-6 hours for long-acting). With the possible exception of prolonged sympathetic blockade from the local anesthetics, benefits during this period are typically attributed to, or associated with, other factors such as analgesic sensory neuropraxia, antiinflammatory effects, or beneficial biochemical changes provided by agents other than the local anesthetics.  Reported result: Extended relief following procedure: 100 %(SHOULDER X 10 days; has gradually returned to pre-procedure pain level; LOWER BACK X 4-5 weeks, has gradually since returned to pre-procedure pain level) (Long-Term Relief)            Interpretative annotation: Clinically possible results. Good relief. No permanent benefit expected. Inflammation plays a part in the etiology to the pain.          Current benefits: Defined as reported results that persistent at this point in time.   Analgesia: 25-50 %            Function: Somewhat improved ROM: Somewhat improved Interpretative annotation: Recurrence of symptoms. No permanent benefit expected. Effective diagnostic intervention.          Interpretation: Results would suggest a successful diagnostic  intervention.                  Plan:  Please see "Plan of Care" for details.                Laboratory Chemistry  Inflammation Markers (CRP: Acute Phase) (ESR: Chronic Phase) Lab Results  Component Value Date   LATICACIDVEN 1.72 01/02/2014                         Rheumatology Markers No results found for: RF, ANA, LABURIC, URICUR, LYMEIGGIGMAB, LYMEABIGMQN, HLAB27                      Renal Function Markers Lab Results  Component Value Date   BUN 13 06/15/2018   CREATININE 0.84 60/73/7106   BCR NOT APPLICABLE 26/94/8546   GFRAA 86 06/15/2018   GFRNONAA 74 06/15/2018                             Hepatic Function Markers Lab Results  Component Value Date   AST 25 06/15/2018   ALT 31 (H) 06/15/2018   ALBUMIN 4.1 04/08/2017   ALKPHOS 119 04/08/2017   LIPASE 19 12/18/2013  Electrolytes Lab Results  Component Value Date   NA 139 06/15/2018   K 4.8 06/15/2018   CL 103 06/15/2018   CALCIUM 9.4 06/15/2018                        Neuropathy Markers Lab Results  Component Value Date   HGBA1C 9.3 (A) 06/11/2018                        CNS Tests No results found for: COLORCSF, APPEARCSF, RBCCOUNTCSF, WBCCSF, POLYSCSF, LYMPHSCSF, EOSCSF, PROTEINCSF, GLUCCSF, JCVIRUS, CSFOLI, IGGCSF                      Bone Pathology Markers No results found for: Box, [id number], [id number], [id number], 25OHVITD1, 25OHVITD2, 25OHVITD3, TESTOFREE, TESTOSTERONE                       Coagulation Parameters Lab Results  Component Value Date   PLT 533 (H) 06/19/2016                        Cardiovascular Markers Lab Results  Component Value Date   TROPONINI <0.03 03/17/2016   HGB 13.0 06/19/2016   HCT 40.0 06/19/2016                         CA Markers No results found for: CEA, CA125, LABCA2                      Endocrine Markers No results found for: TSH, FREET4, TESTOFREE, TESTOSTERONE, ESTRADIOL, ESTRADIOLPCT, ESTRADIOLFRE                      Note: Lab  results reviewed.  Recent Diagnostic Imaging Results  DG C-Arm 1-60 Min-No Report Fluoroscopy was utilized by the requesting physician.  No radiographic  interpretation.   Complexity Note: Imaging results reviewed. Results shared with Ms. Keehan, using Layman's terms.                               Meds   Current Outpatient Medications:  .  amLODipine (NORVASC) 10 MG tablet, TAKE 1 TABLET BY MOUTH ONCE DAILY, Disp: 90 tablet, Rfl: 3 .  carvedilol (COREG) 25 MG tablet, TAKE 1 TABLET BY MOUTH TWICE DAILY WITH A MEAL, Disp: 60 tablet, Rfl: 0 .  diclofenac (VOLTAREN) 75 MG EC tablet, Take 1 tablet (75 mg total) by mouth 2 (two) times daily., Disp: 60 tablet, Rfl: 3 .  empagliflozin (JARDIANCE) 25 MG TABS tablet, Take 25 mg by mouth daily., Disp: 30 tablet, Rfl: 0 .  enalapril (VASOTEC) 20 MG tablet, Take 1 tablet (20 mg total) by mouth 2 (two) times daily., Disp: 60 tablet, Rfl: 11 .  furosemide (LASIX) 40 MG tablet, TAKE 2 TABLETS BY MOUTH ONCE DAILY NEED  APPOINTMENT, Disp: 30 tablet, Rfl: 0 .  gabapentin (NEURONTIN) 100 MG capsule, Take 1 capsule (100 mg total) by mouth 3 (three) times daily., Disp: 90 capsule, Rfl: 3 .  glucose blood (RELION GLUCOSE TEST STRIPS) test strip, Use as instructed, Disp: 100 each, Rfl: 12 .  Insulin Glargine (BASAGLAR KWIKPEN) 100 UNIT/ML SOPN, Inject 0.25 mLs (25 Units total) into the skin daily., Disp: 3 pen, Rfl: 0 .  Insulin Pen Needle (PEN NEEDLES) 32G X 6 MM  MISC, 1 Device by Does not apply route daily., Disp: 100 each, Rfl: 4 .  methocarbamol (ROBAXIN) 500 MG tablet, Take 1 tablet (500 mg total) by mouth every 8 (eight) hours as needed for muscle spasms., Disp: 90 tablet, Rfl: 2 .  ReliOn Ultra Thin Lancets MISC, 1 each by Does not apply route daily., Disp: 100 each, Rfl: 11 .  rosuvastatin (CRESTOR) 10 MG tablet, Take 1 tablet (10 mg total) by mouth daily., Disp: 90 tablet, Rfl: 1 .  sitaGLIPtin (JANUVIA) 100 MG tablet, TAKE 1 TABLET BY MOUTH ONCE DAILY,  Disp: 30 tablet, Rfl: 0  Current Facility-Administered Medications:  .  bupivacaine (PF) (MARCAINE) 0.25 % injection 30 mL, 30 mL, Other, Once, Mohammed Kindle, MD .  ceFAZolin (ANCEF) IVPB 1 g/50 mL premix, 1 g, Intravenous, Once, Mohammed Kindle, MD .  fentaNYL (SUBLIMAZE) injection 100 mcg, 100 mcg, Intravenous, Once, Mohammed Kindle, MD .  lactated ringers infusion 1,000 mL, 1,000 mL, Intravenous, Continuous, Mohammed Kindle, MD, Last Rate: 125 mL/hr at 01/30/16 0933, 1,000 mL at 01/30/16 0933 .  lactated ringers infusion 1,000 mL, 1,000 mL, Intravenous, Continuous, Mohammed Kindle, MD, Last Rate: 125 mL/hr at 03/10/16 1100, 1,000 mL at 03/10/16 1100 .  lactated ringers infusion 1,000 mL, 1,000 mL, Intravenous, Continuous, Mohammed Kindle, MD .  lidocaine (PF) (XYLOCAINE) 1 % injection 10 mL, 10 mL, Subcutaneous, Once, Mohammed Kindle, MD .  midazolam (VERSED) 5 MG/5ML injection 5 mg, 5 mg, Intravenous, Once, Mohammed Kindle, MD .  orphenadrine (NORFLEX) injection 60 mg, 60 mg, Intramuscular, Once, Mohammed Kindle, MD .  sodium chloride flush (NS) 0.9 % injection 20 mL, 20 mL, Other, Once, Mohammed Kindle, MD .  triamcinolone acetonide (KENALOG-40) injection 40 mg, 40 mg, Other, Once, Mohammed Kindle, MD  ROS  Constitutional: Denies any fever or chills Gastrointestinal: No reported hemesis, hematochezia, vomiting, or acute GI distress Musculoskeletal: Denies any acute onset joint swelling, redness, loss of ROM, or weakness Neurological: No reported episodes of acute onset apraxia, aphasia, dysarthria, agnosia, amnesia, paralysis, loss of coordination, or loss of consciousness  Allergies  Ms. Christo is allergic to prednisone and metformin and related.  PFSH  Drug: Ms. Kocher  reports no history of drug use. Alcohol:  reports no history of alcohol use. Tobacco:  reports that she has never smoked. She has never used smokeless tobacco. Medical:  has a past medical history of Allergy, Arthritis, Diabetes  mellitus without complication ([place]), Enlarged heart, and Hypertension. Surgical: Ms. Bubb  has a past surgical history that includes Colon surgery; Hernia repair; and Wrist fracture surgery (Right). Family: family history includes Cancer in her father; Diabetes in her mother; Mental illness in her son.  Constitutional Exam  General appearance: Well nourished, well developed, and well hydrated. In no apparent acute distress Vitals:   11/10/18 1359  BP: 125/65  Pulse: 75  Resp: 16  Temp: 98.3 F (36.8 C)  TempSrc: Oral  SpO2: 93%  Weight: 248 lb (112.5 kg)  Height: '5\' 2"'$  (1.575 m)   BMI Assessment: Estimated body mass index is 45.36 kg/m as calculated from the following:   Height as of this encounter: '5\' 2"'$  (1.575 m).   Weight as of this encounter: 248 lb (112.5 kg).  BMI interpretation table: BMI level Category Range association with higher incidence of chronic pain  <18 kg/m2 Underweight   18.5-24.9 kg/m2 Ideal body weight   25-29.9 kg/m2 Overweight Increased incidence by 20%  30-34.9 kg/m2 Obese (Class I) Increased incidence by 68%  35-39.9  kg/m2 Severe obesity (Class II) Increased incidence by 136%  >40 kg/m2 Extreme obesity (Class III) Increased incidence by 254%   Patient's current BMI Ideal Body weight  Body mass index is 45.36 kg/m. Ideal body weight: 50.1 kg (110 lb 7.2 oz) Adjusted ideal body weight: 75.1 kg (165 lb 7.5 oz)   BMI Readings from Last 4 Encounters:  11/10/18 45.36 kg/m  10/26/18 45.36 kg/m  09/29/18 43.90 kg/m  09/23/18 43.90 kg/m   Wt Readings from Last 4 Encounters:  11/10/18 248 lb (112.5 kg)  10/26/18 248 lb (112.5 kg)  09/29/18 240 lb (108.9 kg)  09/23/18 240 lb (108.9 kg)  Psych/Mental status: Alert, oriented x 3 (person, place, & time)       Eyes: PERLA Respiratory: No evidence of acute respiratory distress  Cervical Spine Area Exam  Skin & Axial Inspection: No masses, redness, edema, swelling, or associated skin  lesions Alignment: Symmetrical Functional ROM: Unrestricted ROM      Stability: No instability detected Muscle Tone/Strength: Functionally intact. No obvious neuro-muscular anomalies detected. Sensory (Neurological): Unimpaired Palpation: No palpable anomalies              Upper Extremity (UE) Exam    Side: Right upper extremity  Side: Left upper extremity  Skin & Extremity Inspection: Skin color, temperature, and hair growth are WNL. No peripheral edema or cyanosis. No masses, redness, swelling, asymmetry, or associated skin lesions. No contractures.  Skin & Extremity Inspection: Skin color, temperature, and hair growth are WNL. No peripheral edema or cyanosis. No masses, redness, swelling, asymmetry, or associated skin lesions. No contractures.  Functional ROM: Mechanically restricted ROM for shoulder  Functional ROM: Unrestricted ROM          Muscle Tone/Strength: Functionally intact. No obvious neuro-muscular anomalies detected.  Muscle Tone/Strength: Functionally intact. No obvious neuro-muscular anomalies detected.  Sensory (Neurological): Arthropathic arthralgia          Sensory (Neurological): Unimpaired          Palpation: No palpable anomalies              Palpation: No palpable anomalies              Provocative Test(s):  Phalen's test: deferred Tinel's test: deferred Apley's scratch test (touch opposite shoulder):  Action 1 (Across chest): Decreased ROM Action 2 (Overhead): Decreased ROM Action 3 (LB reach): Decreased ROM   Provocative Test(s):  Phalen's test: deferred Tinel's test: deferred Apley's scratch test (touch opposite shoulder):  Action 1 (Across chest): deferred Action 2 (Overhead): deferred Action 3 (LB reach): deferred    Thoracic Spine Area Exam  Skin & Axial Inspection: No masses, redness, or swelling Alignment: Symmetrical Functional ROM: Unrestricted ROM Stability: No instability detected Muscle Tone/Strength: Functionally intact. No obvious  neuro-muscular anomalies detected. Sensory (Neurological): Unimpaired Muscle strength & Tone: No palpable anomalies  Lumbar Spine Area Exam  Skin & Axial Inspection: No masses, redness, or swelling Alignment: Symmetrical Functional ROM: Improved after treatment       Stability: No instability detected Muscle Tone/Strength: Functionally intact. No obvious neuro-muscular anomalies detected. Sensory (Neurological): Musculoskeletal pain pattern Palpation: No palpable anomalies       Provocative Tests: Hyperextension/rotation test: (+) bilaterally for facet joint pain. Lumbar quadrant test (Kemp's test): deferred today       Lateral bending test: deferred today       Patrick's Maneuver: deferred today                   FABER*  test: deferred today                   S-I anterior distraction/compression test: deferred today         S-I lateral compression test: deferred today         S-I Thigh-thrust test: deferred today         S-I Gaenslen's test: deferred today         *(Flexion, ABduction and External Rotation)  Gait & Posture Assessment  Ambulation: Unassisted Gait: Relatively normal for age and body habitus Posture: WNL   Lower Extremity Exam    Side: Right lower extremity  Side: Left lower extremity  Stability: No instability observed          Stability: No instability observed          Skin & Extremity Inspection: Skin color, temperature, and hair growth are WNL. No peripheral edema or cyanosis. No masses, redness, swelling, asymmetry, or associated skin lesions. No contractures.  Skin & Extremity Inspection: Skin color, temperature, and hair growth are WNL. No peripheral edema or cyanosis. No masses, redness, swelling, asymmetry, or associated skin lesions. No contractures.  Functional ROM: Unrestricted ROM                  Functional ROM: Unrestricted ROM                  Muscle Tone/Strength: Functionally intact. No obvious neuro-muscular anomalies detected.  Muscle  Tone/Strength: Functionally intact. No obvious neuro-muscular anomalies detected.  Sensory (Neurological): Unimpaired        Sensory (Neurological): Unimpaired        DTR: Patellar: deferred today Achilles: deferred today Plantar: deferred today  DTR: Patellar: deferred today Achilles: deferred today Plantar: deferred today  Palpation: No palpable anomalies  Palpation: No palpable anomalies   Assessment   Status Diagnosis  Having a Flare-up Persistent Persistent 1. Disorder of right rotator cuff   2. Chronic right shoulder pain   3. Right shoulder tendonitis   4. Spondylosis without myelopathy or radiculopathy, lumbar region   5. Facet syndrome, lumbar      Updated Problems: Problem  Disorder of Right Rotator Cuff  Chronic Right Shoulder Pain  Right Shoulder Tendonitis   Patient follows up today for postprocedural evaluation.  Endorsed significant pain relief after right glenohumeral shoulder steroid injection.  Is now noticing return of pain.  Discussed repeating right shoulder steroid injection #2.  Also discussed obtaining further diagnostic imaging, recommend right shoulder CT as patient claustrophobic and wants to avoid right shoulder MRI.  Patient experiencing benefit after lumbar facet medial branch nerve blocks.  We will hold off on repeating until 2 to 3 months.  Patient is a candidate for lumbar radiofrequency ablation however she has been having issues with insurance approval.  Plan of Care   Lab-work, procedure(s), and/or referral(s): Orders Placed This Encounter  Procedures  . SHOULDER INJECTION  . CT SHOULDER RIGHT WO CONTRAST    Interventional management options:  Considering:   Repeat right shoulder steroid injection Right suprascapular nerve block Repeat lumbar facet medial branch nerve blocks at L2, L3, L4, L5 Lumbar radiofrequency ablation.   PRN Procedures:   To be determined at a later time   Provider-requested follow-up: Return for Procedure,  After Imaging.  Time Note: Greater than 50% of the 25 minute(s) of face-to-face time spent with Ms. Tregoning, was spent in counseling/coordination of care regarding: Ms. Gruwell's primary cause of pain, the treatment plan, treatment  alternatives, the risks and possible complications of proposed treatment, going over the informed consent, the results, interpretation and significance of  her recent diagnostic interventional treatment(s), realistic expectations and the goals of pain management (increased in functionality).  Future Appointments  Date Time Provider [place]  11/24/2018 10:30 AM Gillis Santa, MD [facility] None  11/26/2018  9:40 AM Mikey College, NP [facility] None    Primary Care Physician: Mikey College, NP Location: [facility] Note by: Gillis Santa, M.D Date: 11/10/2018; Time: 3:24 PM  Patient Instructions   ____________________________________________________________________________________________  Preparing for Procedure with Sedation  Procedure appointments are limited to planned procedures: . No Prescription Refills. . No disability issues will be discussed. . No medication changes will be discussed.  Instructions: . Oral Intake: Do not eat or drink anything for at least 8 hours prior to your procedure. . Transportation: Public transportation is not allowed. Bring an adult driver. The driver must be physically present in our waiting room before any procedure can be started. Marland Kitchen Physical Assistance: Bring an adult physically capable of assisting you, in the event you need help. This adult should keep you company at home for at least 6 hours after the procedure. . Blood Pressure Medicine: Take your blood pressure medicine with a sip of water the morning of the procedure. . Blood thinners: Notify our staff if you are taking any blood thinners. Depending on which one you take, there will be specific instructions on how and when  to stop it. . Diabetics on insulin: Notify the staff so that you can be scheduled 1st case in the morning. If your diabetes requires high dose insulin, take only  of your normal insulin dose the morning of the procedure and notify the staff that you have done so. . Preventing infections: Shower with an antibacterial soap the morning of your procedure. . Build-up your immune system: Take 1000 mg of Vitamin C with every meal (3 times a day) the day prior to your procedure. Marland Kitchen Antibiotics: Inform the staff if you have a condition or reason that requires you to take antibiotics before dental procedures. . Pregnancy: If you are pregnant, call and cancel the procedure. . Sickness: If you have a cold, fever, or any active infections, call and cancel the procedure. . Arrival: You must be in the facility at least 30 minutes prior to your scheduled procedure. . Children: Do not bring children with you. . Dress appropriately: Bring dark clothing that you would not mind if they get stained. . Valuables: Do not bring any jewelry or valuables.  Reasons to call and reschedule or cancel your procedure: (Following these recommendations will minimize the risk of a serious complication.) . Surgeries: Avoid having procedures within 2 weeks of any surgery. (Avoid for 2 weeks before or after any surgery). . Flu Shots: Avoid having procedures within 2 weeks of a flu shots or . (Avoid for 2 weeks before or after immunizations). . Barium: Avoid having a procedure within 7-10 days after having had a radiological study involving the use of radiological contrast. (Myelograms, Barium swallow or enema study). . Heart attacks: Avoid any elective procedures or surgeries for the initial 6 months after a "Myocardial Infarction" (Heart Attack). . Blood thinners: It is imperative that you stop these medications before procedures. Let us know if you if you take any blood thinner.  . Infection: Avoid procedures during or within two  weeks of an infection (including chest colds or gastrointestinal problems). Symptoms associated  with infections include: Localized redness, fever, chills, night sweats or profuse sweating, burning sensation when voiding, cough, congestion, stuffiness, runny nose, sore throat, diarrhea, nausea, vomiting, cold or Flu symptoms, recent or current infections. It is specially important if the infection is over the area that we intend to treat. Marland Kitchen Heart and lung problems: Symptoms that may suggest an active cardiopulmonary problem include: cough, chest pain, breathing difficulties or shortness of breath, dizziness, ankle swelling, uncontrolled high or unusually low blood pressure, and/or palpitations. If you are experiencing any of these symptoms, cancel your procedure and contact your primary care physician for an evaluation.  Remember:  Regular Business hours are:  Monday to Thursday 8:00 AM to 4:00 PM  Provider's Schedule: Milinda Pointer, MD:  Procedure days: Tuesday and Thursday 7:30 AM to 4:00 PM  Gillis Santa, MD:  Procedure days: Monday and Wednesday 7:30 AM to 4:00 PM ____________________________________________________________________________________________  GENERAL RISKS AND COMPLICATIONS  What are the risk, side effects and possible complications? Generally speaking, most procedures are safe.  However, with any procedure there are risks, side effects, and the possibility of complications.  The risks and complications are dependent upon the sites that are lesioned, or the type of nerve block to be performed.  The closer the procedure is to the spine, the more serious the risks are.  Great care is taken when placing the radio frequency needles, block needles or lesioning probes, but sometimes complications can occur. 1. Infection: Any time there is an injection through the skin, there is a risk of infection.  This is why sterile conditions are used for these blocks.  There are four possible  types of infection. 1. Localized skin infection. 2. Central Nervous System Infection-This can be in the form of Meningitis, which can be deadly. 3. Epidural Infections-This can be in the form of an epidural abscess, which can cause pressure inside of the spine, causing compression of the spinal cord with subsequent paralysis. This would require an emergency surgery to decompress, and there are no guarantees that the patient would recover from the paralysis. 4. Discitis-This is an infection of the intervertebral discs.  It occurs in about 1% of discography procedures.  It is difficult to treat and it may lead to surgery.        2. Pain: the needles have to go through skin and soft tissues, will cause soreness.       3. Damage to internal structures:  The nerves to be lesioned may be near blood vessels or    other nerves which can be potentially damaged.       4. Bleeding: Bleeding is more common if the patient is taking blood thinners such as  aspirin, Coumadin, Ticiid, Plavix, etc., or if he/she have some genetic predisposition  such as hemophilia. Bleeding into the spinal canal can cause compression of the spinal  cord with subsequent paralysis.  This would require an emergency surgery to  decompress and there are no guarantees that the patient would recover from the  paralysis.       5. Pneumothorax:  Puncturing of a lung is a possibility, every time a needle is introduced in  the area of the chest or upper back.  Pneumothorax refers to free air around the  collapsed lung(s), inside of the thoracic cavity (chest cavity).  Another two possible  complications related to a similar event would include: Hemothorax and Chylothorax.   These are variations of the Pneumothorax, where instead of air around the collapsed  lung(s), you may have blood or chyle, respectively.       6. Spinal headaches: They may occur with any procedures in the area of the spine.       7. Persistent CSF (Cerebro-Spinal Fluid)  leakage: This is a rare problem, but may occur  with prolonged intrathecal or epidural catheters either due to the formation of a fistulous  track or a dural tear.       8. Nerve damage: By working so close to the spinal cord, there is always a possibility of  nerve damage, which could be as serious as a permanent spinal cord injury with  paralysis.       9. Death:  Although rare, severe deadly allergic reactions known as "Anaphylactic  reaction" can occur to any of the medications used.      10. Worsening of the symptoms:  We can always make thing worse.  What are the chances of something like this happening? Chances of any of this occuring are extremely low.  By statistics, you have more of a chance of getting killed in a motor vehicle accident: while driving to the hospital than any of the above occurring .  Nevertheless, you should be aware that they are possibilities.  In general, it is similar to taking a shower.  Everybody knows that you can slip, hit your head and get killed.  Does that mean that you should not shower again?  Nevertheless always keep in mind that statistics do not mean anything if you happen to be on the wrong side of them.  Even if a procedure has a 1 (one) in a 1,000,000 (million) chance of going wrong, it you happen to be that one..Also, keep in mind that by statistics, you have more of a chance of having something go wrong when taking medications.  Who should not have this procedure? If you are on a blood thinning medication (e.g. Coumadin, Plavix, see list of "Blood Thinners"), or if you have an active infection going on, you should not have the procedure.  If you are taking any blood thinners, please inform your physician.  How should I prepare for this procedure?  Do not eat or drink anything at least six hours prior to the procedure.  Bring a driver with you .  It cannot be a taxi.  Come accompanied by an adult that can drive you back, and that is strong enough to  help you if your legs get weak or numb from the local anesthetic.  Take all of your medicines the morning of the procedure with just enough water to swallow them.  If you have diabetes, make sure that you are scheduled to have your procedure done first thing in the morning, whenever possible.  If you have diabetes, take only half of your insulin dose and notify our nurse that you have done so as soon as you arrive at the clinic.  If you are diabetic, but only take blood sugar pills (oral hypoglycemic), then do not take them on the morning of your procedure.  You may take them after you have had the procedure.  Do not take aspirin or any aspirin-containing medications, at least eleven (11) days prior to the procedure.  They may prolong bleeding.  Wear loose fitting clothing that may be easy to take off and that you would not mind if it got stained with Betadine or blood.  Do not wear any jewelry or perfume  Remove any nail coloring.  It  will interfere with some of our monitoring equipment.  NOTE: Remember that this is not meant to be interpreted as a complete list of all possible complications.  Unforeseen problems may occur.  BLOOD THINNERS The following drugs contain aspirin or other products, which can cause increased bleeding during surgery and should not be taken for 2 weeks prior to and 1 week after surgery.  If you should need take something for relief of minor pain, you may take acetaminophen which is found in Tylenol,m Datril, Anacin-3 and Panadol. It is not blood thinner. The products listed below are.  Do not take any of the products listed below in addition to any listed on your instruction sheet.  A.P.C or A.P.C with Codeine Codeine Phosphate Capsules #3 Ibuprofen Ridaura  ABC compound Congesprin Imuran rimadil  Advil Cope Indocin Robaxisal  Alka-Seltzer Effervescent Pain Reliever and Antacid Coricidin or Coricidin-D  Indomethacin Rufen  Alka-Seltzer plus Cold Medicine Cosprin  Ketoprofen S-A-C Tablets  Anacin Analgesic Tablets or Capsules Coumadin Korlgesic Salflex  Anacin Extra Strength Analgesic tablets or capsules CP-2 Tablets Lanoril Salicylate  Anaprox Cuprimine Capsules Levenox Salocol  Anexsia-D Dalteparin Magan Salsalate  Anodynos Darvon compound Magnesium Salicylate Sine-off  Ansaid Dasin Capsules Magsal Sodium Salicylate  Anturane Depen Capsules Marnal Soma  APF Arthritis pain formula Dewitt's Pills Measurin Stanback  Argesic Dia-Gesic Meclofenamic Sulfinpyrazone  Arthritis Bayer Timed Release Aspirin Diclofenac Meclomen Sulindac  Arthritis pain formula Anacin Dicumarol Medipren Supac  Analgesic (Safety coated) Arthralgen Diffunasal Mefanamic Suprofen  Arthritis Strength Bufferin Dihydrocodeine Mepro Compound Suprol  Arthropan liquid Dopirydamole Methcarbomol with Aspirin Synalgos  ASA tablets/Enseals Disalcid Micrainin Tagament  Ascriptin Doan's Midol Talwin  Ascriptin A/D Dolene Mobidin Tanderil  Ascriptin Extra Strength Dolobid Moblgesic Ticlid  Ascriptin with Codeine Doloprin or Doloprin with Codeine Momentum Tolectin  Asperbuf Duoprin Mono-gesic Trendar  Aspergum Duradyne Motrin or Motrin IB Triminicin  Aspirin plain, buffered or enteric coated Durasal Myochrisine Trigesic  Aspirin Suppositories Easprin Nalfon Trillsate  Aspirin with Codeine Ecotrin Regular or Extra Strength Naprosyn Uracel  Atromid-S Efficin Naproxen Ursinus  Auranofin Capsules Elmiron Neocylate Vanquish  Axotal Emagrin Norgesic Verin  Azathioprine Empirin or Empirin with Codeine Normiflo Vitamin E  Azolid Emprazil Nuprin Voltaren  Bayer Aspirin plain, buffered or children's or timed BC Tablets or powders Encaprin Orgaran Warfarin Sodium  Buff-a-Comp Enoxaparin Orudis Zorpin  Buff-a-Comp with Codeine Equegesic Os-Cal-Gesic   Buffaprin Excedrin plain, buffered or Extra Strength Oxalid   Bufferin Arthritis Strength Feldene Oxphenbutazone   Bufferin plain or Extra Strength  Feldene Capsules Oxycodone with Aspirin   Bufferin with Codeine Fenoprofen Fenoprofen Pabalate or Pabalate-SF   Buffets II Flogesic Panagesic   Buffinol plain or Extra Strength Florinal or Florinal with Codeine Panwarfarin   Buf-Tabs Flurbiprofen Penicillamine   Butalbital Compound Four-way cold tablets Penicillin   Butazolidin Fragmin Pepto-Bismol   Carbenicillin Geminisyn Percodan   Carna Arthritis Reliever Geopen Persantine   Carprofen Gold's salt Persistin   Chloramphenicol Goody's Phenylbutazone   Chloromycetin Haltrain Piroxlcam   Clmetidine heparin Plaquenil   Cllnoril Hyco-pap Ponstel   Clofibrate Hydroxy chloroquine Propoxyphen         Before stopping any of these medications, be sure to consult the physician who ordered them.  Some, such as Coumadin (Warfarin) are ordered to prevent or treat serious conditions such as "deep thrombosis", "pumonary embolisms", and other heart problems.  The amount of time that you may need off of the medication may also vary with the medication and the reason for which you were  taking it.  If you are taking any of these medications, please make sure you notify your pain physician before you undergo any procedures.

## 2018-11-10 NOTE — Patient Instructions (Signed)
____________________________________________________________________________________________  Preparing for Procedure with Sedation  Procedure appointments are limited to planned procedures: . No Prescription Refills. . No disability issues will be discussed. . No medication changes will be discussed.  Instructions: . Oral Intake: Do not eat or drink anything for at least 8 hours prior to your procedure. . Transportation: Public transportation is not allowed. Bring an adult driver. The driver must be physically present in our waiting room before any procedure can be started. . Physical Assistance: Bring an adult physically capable of assisting you, in the event you need help. This adult should keep you company at home for at least 6 hours after the procedure. . Blood Pressure Medicine: Take your blood pressure medicine with a sip of water the morning of the procedure. . Blood thinners: Notify our staff if you are taking any blood thinners. Depending on which one you take, there will be specific instructions on how and when to stop it. . Diabetics on insulin: Notify the staff so that you can be scheduled 1st case in the morning. If your diabetes requires high dose insulin, take only  of your normal insulin dose the morning of the procedure and notify the staff that you have done so. . Preventing infections: Shower with an antibacterial soap the morning of your procedure. . Build-up your immune system: Take 1000 mg of Vitamin C with every meal (3 times a day) the day prior to your procedure. . Antibiotics: Inform the staff if you have a condition or reason that requires you to take antibiotics before dental procedures. . Pregnancy: If you are pregnant, call and cancel the procedure. . Sickness: If you have a cold, fever, or any active infections, call and cancel the procedure. . Arrival: You must be in the facility at least 30 minutes prior to your scheduled procedure. . Children: Do not bring  children with you. . Dress appropriately: Bring dark clothing that you would not mind if they get stained. . Valuables: Do not bring any jewelry or valuables.  Reasons to call and reschedule or cancel your procedure: (Following these recommendations will minimize the risk of a serious complication.) . Surgeries: Avoid having procedures within 2 weeks of any surgery. (Avoid for 2 weeks before or after any surgery). . Flu Shots: Avoid having procedures within 2 weeks of a flu shots or . (Avoid for 2 weeks before or after immunizations). . Barium: Avoid having a procedure within 7-10 days after having had a radiological study involving the use of radiological contrast. (Myelograms, Barium swallow or enema study). . Heart attacks: Avoid any elective procedures or surgeries for the initial 6 months after a "Myocardial Infarction" (Heart Attack). . Blood thinners: It is imperative that you stop these medications before procedures. Let us know if you if you take any blood thinner.  . Infection: Avoid procedures during or within two weeks of an infection (including chest colds or gastrointestinal problems). Symptoms associated with infections include: Localized redness, fever, chills, night sweats or profuse sweating, burning sensation when voiding, cough, congestion, stuffiness, runny nose, sore throat, diarrhea, nausea, vomiting, cold or Flu symptoms, recent or current infections. It is specially important if the infection is over the area that we intend to treat. . Heart and lung problems: Symptoms that may suggest an active cardiopulmonary problem include: cough, chest pain, breathing difficulties or shortness of breath, dizziness, ankle swelling, uncontrolled high or unusually low blood pressure, and/or palpitations. If you are experiencing any of these symptoms, cancel your procedure and contact   your primary care physician for an evaluation.  Remember:  Regular Business hours are:  Monday to Thursday  8:00 AM to 4:00 PM  Provider's Schedule: Milinda Pointer, MD:  Procedure days: Tuesday and Thursday 7:30 AM to 4:00 PM  Gillis Santa, MD:  Procedure days: Monday and Wednesday 7:30 AM to 4:00 PM ____________________________________________________________________________________________  GENERAL RISKS AND COMPLICATIONS  What are the risk, side effects and possible complications? Generally speaking, most procedures are safe.  However, with any procedure there are risks, side effects, and the possibility of complications.  The risks and complications are dependent upon the sites that are lesioned, or the type of nerve block to be performed.  The closer the procedure is to the spine, the more serious the risks are.  Great care is taken when placing the radio frequency needles, block needles or lesioning probes, but sometimes complications can occur. 1. Infection: Any time there is an injection through the skin, there is a risk of infection.  This is why sterile conditions are used for these blocks.  There are four possible types of infection. 1. Localized skin infection. 2. Central Nervous System Infection-This can be in the form of Meningitis, which can be deadly. 3. Epidural Infections-This can be in the form of an epidural abscess, which can cause pressure inside of the spine, causing compression of the spinal cord with subsequent paralysis. This would require an emergency surgery to decompress, and there are no guarantees that the patient would recover from the paralysis. 4. Discitis-This is an infection of the intervertebral discs.  It occurs in about 1% of discography procedures.  It is difficult to treat and it may lead to surgery.        2. Pain: the needles have to go through skin and soft tissues, will cause soreness.       3. Damage to internal structures:  The nerves to be lesioned may be near blood vessels or    other nerves which can be potentially damaged.       4. Bleeding:  Bleeding is more common if the patient is taking blood thinners such as  aspirin, Coumadin, Ticiid, Plavix, etc., or if he/she have some genetic predisposition  such as hemophilia. Bleeding into the spinal canal can cause compression of the spinal  cord with subsequent paralysis.  This would require an emergency surgery to  decompress and there are no guarantees that the patient would recover from the  paralysis.       5. Pneumothorax:  Puncturing of a lung is a possibility, every time a needle is introduced in  the area of the chest or upper back.  Pneumothorax refers to free air around the  collapsed lung(s), inside of the thoracic cavity (chest cavity).  Another two possible  complications related to a similar event would include: Hemothorax and Chylothorax.   These are variations of the Pneumothorax, where instead of air around the collapsed  lung(s), you may have blood or chyle, respectively.       6. Spinal headaches: They may occur with any procedures in the area of the spine.       7. Persistent CSF (Cerebro-Spinal Fluid) leakage: This is a rare problem, but may occur  with prolonged intrathecal or epidural catheters either due to the formation of a fistulous  track or a dural tear.       8. Nerve damage: By working so close to the spinal cord, there is always a possibility of  nerve damage, which  could be as serious as a permanent spinal cord injury with  paralysis.       9. Death:  Although rare, severe deadly allergic reactions known as "Anaphylactic  reaction" can occur to any of the medications used.      10. Worsening of the symptoms:  We can always make thing worse.  What are the chances of something like this happening? Chances of any of this occuring are extremely low.  By statistics, you have more of a chance of getting killed in a motor vehicle accident: while driving to the hospital than any of the above occurring .  Nevertheless, you should be aware that they are possibilities.  In  general, it is similar to taking a shower.  Everybody knows that you can slip, hit your head and get killed.  Does that mean that you should not shower again?  Nevertheless always keep in mind that statistics do not mean anything if you happen to be on the wrong side of them.  Even if a procedure has a 1 (one) in a 1,000,000 (million) chance of going wrong, it you happen to be that one..Also, keep in mind that by statistics, you have more of a chance of having something go wrong when taking medications.  Who should not have this procedure? If you are on a blood thinning medication (e.g. Coumadin, Plavix, see list of "Blood Thinners"), or if you have an active infection going on, you should not have the procedure.  If you are taking any blood thinners, please inform your physician.  How should I prepare for this procedure?  Do not eat or drink anything at least six hours prior to the procedure.  Bring a driver with you .  It cannot be a taxi.  Come accompanied by an adult that can drive you back, and that is strong enough to help you if your legs get weak or numb from the local anesthetic.  Take all of your medicines the morning of the procedure with just enough water to swallow them.  If you have diabetes, make sure that you are scheduled to have your procedure done first thing in the morning, whenever possible.  If you have diabetes, take only half of your insulin dose and notify our nurse that you have done so as soon as you arrive at the clinic.  If you are diabetic, but only take blood sugar pills (oral hypoglycemic), then do not take them on the morning of your procedure.  You may take them after you have had the procedure.  Do not take aspirin or any aspirin-containing medications, at least eleven (11) days prior to the procedure.  They may prolong bleeding.  Wear loose fitting clothing that may be easy to take off and that you would not mind if it got stained with Betadine or  blood.  Do not wear any jewelry or perfume  Remove any nail coloring.  It will interfere with some of our monitoring equipment.  NOTE: Remember that this is not meant to be interpreted as a complete list of all possible complications.  Unforeseen problems may occur.  BLOOD THINNERS The following drugs contain aspirin or other products, which can cause increased bleeding during surgery and should not be taken for 2 weeks prior to and 1 week after surgery.  If you should need take something for relief of minor pain, you may take acetaminophen which is found in Tylenol,m Datril, Anacin-3 and Panadol. It is not blood thinner. The products  listed below are.  Do not take any of the products listed below in addition to any listed on your instruction sheet.  A.P.C or A.P.C with Codeine Codeine Phosphate Capsules #3 Ibuprofen Ridaura  ABC compound Congesprin Imuran rimadil  Advil Cope Indocin Robaxisal  Alka-Seltzer Effervescent Pain Reliever and Antacid Coricidin or Coricidin-D  Indomethacin Rufen  Alka-Seltzer plus Cold Medicine Cosprin Ketoprofen S-A-C Tablets  Anacin Analgesic Tablets or Capsules Coumadin Korlgesic Salflex  Anacin Extra Strength Analgesic tablets or capsules CP-2 Tablets Lanoril Salicylate  Anaprox Cuprimine Capsules Levenox Salocol  Anexsia-D Dalteparin Magan Salsalate  Anodynos Darvon compound Magnesium Salicylate Sine-off  Ansaid Dasin Capsules Magsal Sodium Salicylate  Anturane Depen Capsules Marnal Soma  APF Arthritis pain formula Dewitt's Pills Measurin Stanback  Argesic Dia-Gesic Meclofenamic Sulfinpyrazone  Arthritis Bayer Timed Release Aspirin Diclofenac Meclomen Sulindac  Arthritis pain formula Anacin Dicumarol Medipren Supac  Analgesic (Safety coated) Arthralgen Diffunasal Mefanamic Suprofen  Arthritis Strength Bufferin Dihydrocodeine Mepro Compound Suprol  Arthropan liquid Dopirydamole Methcarbomol with Aspirin Synalgos  ASA tablets/Enseals Disalcid Micrainin  Tagament  Ascriptin Doan's Midol Talwin  Ascriptin A/D Dolene Mobidin Tanderil  Ascriptin Extra Strength Dolobid Moblgesic Ticlid  Ascriptin with Codeine Doloprin or Doloprin with Codeine Momentum Tolectin  Asperbuf Duoprin Mono-gesic Trendar  Aspergum Duradyne Motrin or Motrin IB Triminicin  Aspirin plain, buffered or enteric coated Durasal Myochrisine Trigesic  Aspirin Suppositories Easprin Nalfon Trillsate  Aspirin with Codeine Ecotrin Regular or Extra Strength Naprosyn Uracel  Atromid-S Efficin Naproxen Ursinus  Auranofin Capsules Elmiron Neocylate Vanquish  Axotal Emagrin Norgesic Verin  Azathioprine Empirin or Empirin with Codeine Normiflo Vitamin E  Azolid Emprazil Nuprin Voltaren  Bayer Aspirin plain, buffered or children's or timed BC Tablets or powders Encaprin Orgaran Warfarin Sodium  Buff-a-Comp Enoxaparin Orudis Zorpin  Buff-a-Comp with Codeine Equegesic Os-Cal-Gesic   Buffaprin Excedrin plain, buffered or Extra Strength Oxalid   Bufferin Arthritis Strength Feldene Oxphenbutazone   Bufferin plain or Extra Strength Feldene Capsules Oxycodone with Aspirin   Bufferin with Codeine Fenoprofen Fenoprofen Pabalate or Pabalate-SF   Buffets II Flogesic Panagesic   Buffinol plain or Extra Strength Florinal or Florinal with Codeine Panwarfarin   Buf-Tabs Flurbiprofen Penicillamine   Butalbital Compound Four-way cold tablets Penicillin   Butazolidin Fragmin Pepto-Bismol   Carbenicillin Geminisyn Percodan   Carna Arthritis Reliever Geopen Persantine   Carprofen Gold's salt Persistin   Chloramphenicol Goody's Phenylbutazone   Chloromycetin Haltrain Piroxlcam   Clmetidine heparin Plaquenil   Cllnoril Hyco-pap Ponstel   Clofibrate Hydroxy chloroquine Propoxyphen         Before stopping any of these medications, be sure to consult the physician who ordered them.  Some, such as Coumadin (Warfarin) are ordered to prevent or treat serious conditions such as "deep thrombosis", "pumonary  embolisms", and other heart problems.  The amount of time that you may need off of the medication may also vary with the medication and the reason for which you were taking it.  If you are taking any of these medications, please make sure you notify your pain physician before you undergo any procedures.

## 2018-11-22 ENCOUNTER — Other Ambulatory Visit: Payer: Self-pay | Admitting: Nurse Practitioner

## 2018-11-22 DIAGNOSIS — E785 Hyperlipidemia, unspecified: Secondary | ICD-10-CM

## 2018-11-22 DIAGNOSIS — I10 Essential (primary) hypertension: Secondary | ICD-10-CM

## 2018-11-22 DIAGNOSIS — E1142 Type 2 diabetes mellitus with diabetic polyneuropathy: Secondary | ICD-10-CM

## 2018-11-22 DIAGNOSIS — E1169 Type 2 diabetes mellitus with other specified complication: Secondary | ICD-10-CM

## 2018-11-24 ENCOUNTER — Ambulatory Visit: Payer: BLUE CROSS/BLUE SHIELD | Admitting: Student in an Organized Health Care Education/Training Program

## 2018-11-26 ENCOUNTER — Other Ambulatory Visit: Payer: Self-pay

## 2018-11-26 ENCOUNTER — Ambulatory Visit (INDEPENDENT_AMBULATORY_CARE_PROVIDER_SITE_OTHER): Payer: BLUE CROSS/BLUE SHIELD | Admitting: Nurse Practitioner

## 2018-11-26 ENCOUNTER — Encounter: Payer: Self-pay | Admitting: Nurse Practitioner

## 2018-11-26 VITALS — BP 134/61 | HR 73 | Temp 98.2°F | Ht 62.0 in | Wt 249.6 lb

## 2018-11-26 DIAGNOSIS — E1142 Type 2 diabetes mellitus with diabetic polyneuropathy: Secondary | ICD-10-CM

## 2018-11-26 DIAGNOSIS — E1169 Type 2 diabetes mellitus with other specified complication: Secondary | ICD-10-CM | POA: Diagnosis not present

## 2018-11-26 DIAGNOSIS — I10 Essential (primary) hypertension: Secondary | ICD-10-CM | POA: Diagnosis not present

## 2018-11-26 DIAGNOSIS — E785 Hyperlipidemia, unspecified: Secondary | ICD-10-CM

## 2018-11-26 LAB — POCT GLYCOSYLATED HEMOGLOBIN (HGB A1C): Hemoglobin A1C: 7.4 % — AB (ref 4.0–5.6)

## 2018-11-26 MED ORDER — ROSUVASTATIN CALCIUM 10 MG PO TABS: 10.0000 mg | ORAL_TABLET | Freq: Every day | ORAL | 1 refills | Status: DC

## 2018-11-26 MED ORDER — FUROSEMIDE 40 MG PO TABS: ORAL_TABLET | ORAL | 1 refills | Status: DC

## 2018-11-26 MED ORDER — EMPAGLIFLOZIN 25 MG PO TABS: 25.0000 mg | ORAL_TABLET | Freq: Every day | ORAL | 1 refills | Status: DC

## 2018-11-26 MED ORDER — SITAGLIPTIN PHOSPHATE 100 MG PO TABS: ORAL_TABLET | ORAL | 1 refills | Status: DC

## 2018-11-26 MED ORDER — CARVEDILOL 25 MG PO TABS: 25.0000 mg | ORAL_TABLET | Freq: Two times a day (BID) | ORAL | 1 refills | Status: DC

## 2018-11-26 MED ORDER — AMLODIPINE BESYLATE 10 MG PO TABS: ORAL_TABLET | ORAL | 1 refills | Status: DC

## 2018-11-26 MED ORDER — GABAPENTIN 100 MG PO CAPS: 100.0000 mg | ORAL_CAPSULE | Freq: Every day | ORAL | 1 refills | Status: DC

## 2018-11-26 MED ORDER — PEN NEEDLES 32G X 6 MM MISC: 1.0000 | Freq: Every day | 4 refills | Status: DC

## 2018-11-26 MED ORDER — BASAGLAR KWIKPEN 100 UNIT/ML ~~LOC~~ SOPN: 32.0000 [IU] | PEN_INJECTOR | Freq: Every day | SUBCUTANEOUS | 1 refills | Status: DC

## 2018-11-26 MED ORDER — ENALAPRIL MALEATE 20 MG PO TABS: 20.0000 mg | ORAL_TABLET | Freq: Two times a day (BID) | ORAL | 1 refills | Status: DC

## 2018-11-26 NOTE — Progress Notes (Signed)
Subjective:    Patient ID: Stacy Martinez, female    DOB: [birth year], 63 y.o.   MRN: [id number]  Stacy Martinez is a 63 y.o. female presenting on 11/26/2018 for Diabetes   HPI Diabetes Pt presents today for follow up of Type 2 diabetes mellitus. Patient hasn't continued healthier diet and is worried her DM control on A1c will be worse.  Continues medications daily Januvia, Jardiance.  Basaglar was at 24 and is at 30 units daily per instructions.  CBG is usually 160-170 in am.  After dinner before bed is usually around 160-170. Has had no yeast infections, UTIs with starting Jardiance. - She is symptomatic with polydipsia, paresthesias in feet (not worsening).  - She denies polyphagia, polyuria, headaches, diaphoresis, shakiness, chills and changes in vision.   - Clinical course has been stable. - She  reports no regular exercise routine. - Her diet is moderate in salt, moderate in fat, and moderate in carbohydrates. - Weight trend: stable  PREVENTION: Eye exam current (within one year): not documented - [facility] with dilated exam. Foot exam current (within one year): no  Lipid/ASCVD risk reduction - on statin: rosuvastatin 10 mg once daily.  Having some cramping with statin - may need to decrease if labs support - could also consider ezetimibe vs Praluent/Repatha for LDL lowering given statin intolerance Kidney protection - on ace or arb: yes Recent Labs    06/11/18 1022 11/26/18 1016  HGBA1C 9.3* 7.4*   Hypertension - She is not checking BP at home or outside of clinic.    - Current medications: atorvastatin, enalapril, tolerating well without side effects - She is not currently symptomatic. - Pt denies headache, lightheadedness, dizziness, changes in vision, chest tightness/pressure, palpitations, leg swelling, sudden loss of speech or loss of consciousness.  Social History   Tobacco Use   Smoking status: Never Smoker   Smokeless tobacco: Never Used  Substance  Use Topics   Alcohol use: No    Alcohol/week: 0.0 standard drinks   Drug use: No   Review of Systems Per HPI unless specifically indicated above     Objective:    BP 134/61 (BP Location: Left Arm, Patient Position: Sitting, Cuff Size: Large)    Pulse 73    Temp 98.2 F (36.8 C) (Oral)    Ht 5\' 2"  (1.575 m)    Wt 249 lb 9.6 oz (113.2 kg)    BMI 45.65 kg/m   Wt Readings from Last 3 Encounters:  11/26/18 249 lb 9.6 oz (113.2 kg)  11/10/18 248 lb (112.5 kg)  10/26/18 248 lb (112.5 kg)    Physical Exam Vitals signs reviewed.  Constitutional:      General: She is not in acute distress.    Appearance: She is well-developed.  HENT:     Head: Normocephalic and atraumatic.     Right Ear: Tympanic membrane, ear canal and external ear normal.     Left Ear: Tympanic membrane, ear canal and external ear normal.  Cardiovascular:     Rate and Rhythm: Normal rate and regular rhythm.     Pulses:          Radial pulses are 2+ on the right side and 2+ on the left side.       Posterior tibial pulses are 1+ on the right side and 1+ on the left side.     Heart sounds: Normal heart sounds, S1 normal and S2 normal.  Pulmonary:     Effort: Pulmonary effort  is normal. No respiratory distress.     Breath sounds: Normal breath sounds and air entry.  Musculoskeletal:     Right lower leg: No edema.     Left lower leg: No edema.  Skin:    General: Skin is warm and dry.     Capillary Refill: Capillary refill takes less than 2 seconds.  Neurological:     Mental Status: She is alert and oriented to person, place, and time.  Psychiatric:        Attention and Perception: Attention normal.        Mood and Affect: Mood and affect normal.        Behavior: Behavior normal. Behavior is cooperative.        Thought Content: Thought content normal.        Judgment: Judgment normal.    Results for orders placed or performed in visit on 11/26/18  POCT glycosylated hemoglobin (Hb A1C)  Result Value Ref  Range   Hemoglobin A1C 7.4 (A) 4.0 - 5.6 %   HbA1c POC (<> result, manual entry)     HbA1c, POC (prediabetic range)     HbA1c, POC (controlled diabetic range)        Assessment & Plan:   Problem List Items Addressed This Visit      Cardiovascular and Mediastinum   BP (high blood pressure) Controlled hypertension.  BP goal < 130/80.  Pt is not working on lifestyle modifications.  Taking medications tolerating well without side effects. No current complications.  Plan: 1. Continue taking amlodipine, carvedilol, enalapril, furosemide 2. Obtain labs today  3. Encouraged heart healthy diet and increasing exercise to 30 minutes most days of the week. 4. Check BP 1-2 x per week at home, keep log, and bring to clinic at next appointment. 5. Follow up 3 months.     Relevant Medications   amLODipine (NORVASC) 10 MG tablet   carvedilol (COREG) 25 MG tablet   enalapril (VASOTEC) 20 MG tablet   furosemide (LASIX) 40 MG tablet   rosuvastatin (CRESTOR) 10 MG tablet     Endocrine   Hyperlipidemia associated with type 2 diabetes mellitus ([place]) Patient cholesterol not yet at goal at last labs.  Remains uncontrolled hyperlipidemia.  Currently taking rosuvastatin 10 mg daily with myalgias/cramps. No ASCVD events since last visit.   - No personal history of prior ASCVD events  Plan: 1. Continue rosuvastatin 10 mg once daily. - may need to augment with ezetimibe vs praluent/repatha if LDL not at goal. 2. Discussed low glycemic diet. - handout provided 3. Discussed increasing exercise to 30 minutes most days of the week. 4. Follow-up with labs today and OV in about 3 months.    Relevant Medications   Insulin Glargine (BASAGLAR KWIKPEN) 100 UNIT/ML SOPN   empagliflozin (JARDIANCE) 25 MG TABS tablet   enalapril (VASOTEC) 20 MG tablet   rosuvastatin (CRESTOR) 10 MG tablet   sitaGLIPtin (JANUVIA) 100 MG tablet     Nervous and Auditory   DM type 2 with diabetic peripheral neuropathy (HCC) -  Primary ControlledDM with A1c 7.4% improved from 9.3% and goal A1c < 7.0%. - Complications - hyperlipidemia, paresthesias, and hyperglycemia.  Plan:  1. Change therapy:  - INCREASE Basaglar to 32 units daily - Continue Jardiance, Januvia without change 2. Encourage improved lifestyle: - low carb/low glycemic diet reinforced prior education - Increase physical activity to 30 minutes most days of the week.  Explained that increased physical activity increases body's use of sugar for energy.  3. Check fasting am CBG and bring log to next visit for review 4. Continue ASA, ACEi and Statin 5. DM Foot exam done today with normal findings.   and Advised to schedule DM ophtho exam, send record. 6. Follow-up 3 months    Relevant Medications   Insulin Glargine (BASAGLAR KWIKPEN) 100 UNIT/ML SOPN   empagliflozin (JARDIANCE) 25 MG TABS tablet   enalapril (VASOTEC) 20 MG tablet   gabapentin (NEURONTIN) 100 MG capsule   Insulin Pen Needle (PEN NEEDLES) 32G X 6 MM MISC   rosuvastatin (CRESTOR) 10 MG tablet   sitaGLIPtin (JANUVIA) 100 MG tablet   Other Relevant Orders   POCT glycosylated hemoglobin (Hb A1C) (Completed)      Meds ordered this encounter  Medications   Insulin Glargine (BASAGLAR KWIKPEN) 100 UNIT/ML SOPN    Sig: Inject 0.32 mLs (32 Units total) into the skin daily.    Dispense:  10 pen    Refill:  1    Order Specific Question:   Supervising Provider    Answer:   Olin Hauser [[id number]]   amLODipine (NORVASC) 10 MG tablet    Sig: TAKE 1 TABLET BY MOUTH ONCE DAILY    Dispense:  90 tablet    Refill:  1    Order Specific Question:   Supervising Provider    Answer:   Olin Hauser [[id number]]   carvedilol (COREG) 25 MG tablet    Sig: Take 1 tablet (25 mg total) by mouth 2 (two) times daily with a meal.    Dispense:  180 tablet    Refill:  1    Order Specific Question:   Supervising Provider    Answer:   Olin Hauser [[id number]]   empagliflozin  (JARDIANCE) 25 MG TABS tablet    Sig: Take 25 mg by mouth daily.    Dispense:  90 tablet    Refill:  1    Order Specific Question:   Supervising Provider    Answer:   Olin Hauser [[id number]]   enalapril (VASOTEC) 20 MG tablet    Sig: Take 1 tablet (20 mg total) by mouth 2 (two) times daily.    Dispense:  180 tablet    Refill:  1    Order Specific Question:   Supervising Provider    Answer:   Olin Hauser [[id number]]   furosemide (LASIX) 40 MG tablet    Sig: TAKE 2 TABLETS BY MOUTH ONCE DAILY    Dispense:  90 tablet    Refill:  1    Order Specific Question:   Supervising Provider    Answer:   Olin Hauser [[id number]]   gabapentin (NEURONTIN) 100 MG capsule    Sig: Take 1 capsule (100 mg total) by mouth at bedtime.    Dispense:  90 capsule    Refill:  1    Order Specific Question:   Supervising Provider    Answer:   Olin Hauser [[id number]]   Insulin Pen Needle (PEN NEEDLES) 32G X 6 MM MISC    Sig: 1 Device by Does not apply route daily.    Dispense:  100 each    Refill:  4    Order Specific Question:   Supervising Provider    Answer:   Olin Hauser [[id number]]   rosuvastatin (CRESTOR) 10 MG tablet    Sig: Take 1 tablet (10 mg total) by mouth daily.    Dispense:  90 tablet    Refill:  1  Order Specific Question:   Supervising Provider    Answer:   Olin Hauser [[id number]]   sitaGLIPtin (JANUVIA) 100 MG tablet    Sig: TAKE 1 TABLET BY MOUTH ONCE DAILY    Dispense:  90 tablet    Refill:  1    Please consider 90 day supplies to promote better adherence    Order Specific Question:   Supervising Provider    Answer:   Olin Hauser [[id number]]   Follow up plan: Return in about 3 months (around 02/26/2019) for diabetes, hypertension.  Cassell Smiles, DNP, AGPCNP-BC Adult Gerontology Primary Care Nurse Practitioner [facility] 11/26/2018, 9:50 AM

## 2018-11-26 NOTE — Patient Instructions (Addendum)
Stacy Martinez,   Thank you for coming in to clinic today.  1. CHANGE basaglar to 32 units daily.  2. Continue all other meds without changes.  3. We may make changes to cholesterol meds after labs.  Please schedule a follow-up appointment with Cassell Smiles, AGNP. Return in about 3 months (around 02/26/2019) for diabetes, hypertension.  If you have any other questions or concerns, please feel free to call the clinic or send a message through [place]. You may also schedule an earlier appointment if necessary.  You will receive a survey after today's visit either digitally by e-mail or paper by [employer]. Your experiences and feedback matter to [place].  Please respond so we know how we are doing as we provide care for you.   Cassell Smiles, DNP, AGNP-BC Adult Gerontology Nurse Practitioner [place]

## 2018-11-27 LAB — COMPLETE METABOLIC PANEL WITH GFR
AG Ratio: 1.3 (calc) (ref 1.0–2.5)
ALT: 17 U/L (ref 6–29)
AST: 17 U/L (ref 10–35)
Albumin: 4.1 g/dL (ref 3.6–5.1)
Alkaline phosphatase (APISO): 92 U/L (ref 37–153)
BUN: 19 mg/dL (ref 7–25)
CO2: 26 mmol/L (ref 20–32)
Calcium: 9.5 mg/dL (ref 8.6–10.4)
Chloride: 102 mmol/L (ref 98–110)
Creat: 0.8 mg/dL (ref 0.50–0.99)
GFR, Est African American: 92 mL/min/{1.73_m2} (ref >=60)
GFR, Est Non African American: 79 mL/min/{1.73_m2} (ref >=60)
Globulin: 3.1 g/dL (calc) (ref 1.9–3.7)
Glucose, Bld: 130 mg/dL — ABNORMAL HIGH (ref 65–99)
Potassium: 4.9 mmol/L (ref 3.5–5.3)
Sodium: 139 mmol/L (ref 135–146)
Total Bilirubin: 0.4 mg/dL (ref 0.2–1.2)
Total Protein: 7.2 g/dL (ref 6.1–8.1)

## 2018-11-27 LAB — LIPID PANEL
Cholesterol: 200 mg/dL — ABNORMAL HIGH (ref <200)
HDL: 51 mg/dL (ref >=50)
LDL Cholesterol (Calc): 119 mg/dL (calc) — ABNORMAL HIGH
Non-HDL Cholesterol (Calc): 149 mg/dL (calc) — ABNORMAL HIGH (ref <130)
Total CHOL/HDL Ratio: 3.9 (calc) (ref <5.0)
Triglycerides: 188 mg/dL — ABNORMAL HIGH (ref <150)

## 2018-12-24 ENCOUNTER — Other Ambulatory Visit: Payer: Self-pay

## 2018-12-24 DIAGNOSIS — E1142 Type 2 diabetes mellitus with diabetic polyneuropathy: Secondary | ICD-10-CM

## 2018-12-24 MED ORDER — BASAGLAR KWIKPEN 100 UNIT/ML ~~LOC~~ SOPN: 32.0000 [IU] | PEN_INJECTOR | Freq: Every day | SUBCUTANEOUS | 1 refills | Status: DC

## 2019-01-01 ENCOUNTER — Other Ambulatory Visit: Payer: Self-pay | Admitting: Family Medicine

## 2019-01-01 DIAGNOSIS — E1142 Type 2 diabetes mellitus with diabetic polyneuropathy: Secondary | ICD-10-CM

## 2019-01-03 MED ORDER — INSULIN GLARGINE 100 UNIT/ML SOLOSTAR PEN: 32.0000 [IU] | PEN_INJECTOR | Freq: Every day | SUBCUTANEOUS | 2 refills | Status: DC

## 2019-01-03 NOTE — Telephone Encounter (Signed)
Changed from basaglar to lantus  Nobie Putnam, [facility] 01/03/2019, 8:37 AM

## 2019-03-01 ENCOUNTER — Telehealth: Payer: Self-pay

## 2019-03-01 ENCOUNTER — Ambulatory Visit: Payer: BLUE CROSS/BLUE SHIELD | Admitting: Nurse Practitioner

## 2019-03-01 DIAGNOSIS — M47816 Spondylosis without myelopathy or radiculopathy, lumbar region: Secondary | ICD-10-CM

## 2019-03-01 NOTE — Telephone Encounter (Signed)
Do you want to order an injection for the back pain?

## 2019-03-01 NOTE — Telephone Encounter (Signed)
I called the patient to schedule and she wants a shoulder injection and a back injection. There is no order for a back injection. Can you ask what kind of procedure he wants to do?

## 2019-03-01 NOTE — Telephone Encounter (Signed)
I see an order for shoulder injection. Is it still valid?

## 2019-03-01 NOTE — Telephone Encounter (Signed)
Pt called requesting procedure for shoulder and back, she suppose to come back and have it done but covid stopped everything. Pt needs order for procedure.

## 2019-03-04 ENCOUNTER — Other Ambulatory Visit: Payer: Self-pay

## 2019-03-04 ENCOUNTER — Other Ambulatory Visit
Admission: RE | Admit: 2019-03-04 | Discharge: 2019-03-04 | Disposition: A | Discharge status: Home / Self Care | Payer: BLUE CROSS/BLUE SHIELD | Source: Ambulatory Visit | Attending: Student in an Organized Health Care Education/Training Program | Admitting: Student in an Organized Health Care Education/Training Program

## 2019-03-04 DIAGNOSIS — Z01812 Encounter for preprocedural laboratory examination: Secondary | ICD-10-CM | POA: Diagnosis not present

## 2019-03-04 DIAGNOSIS — Z1159 Encounter for screening for other viral diseases: Secondary | ICD-10-CM | POA: Insufficient documentation

## 2019-03-05 LAB — SARS CORONAVIRUS 2 (TAT 6-24 HRS): SARS Coronavirus 2: NEGATIVE

## 2019-03-09 ENCOUNTER — Encounter: Payer: Self-pay | Admitting: Student in an Organized Health Care Education/Training Program

## 2019-03-09 ENCOUNTER — Ambulatory Visit
Admission: RE | Admit: 2019-03-09 | Discharge: 2019-03-09 | Disposition: A | Discharge status: Home / Self Care | Payer: BLUE CROSS/BLUE SHIELD | Source: Ambulatory Visit | Attending: Student in an Organized Health Care Education/Training Program | Admitting: Student in an Organized Health Care Education/Training Program

## 2019-03-09 ENCOUNTER — Other Ambulatory Visit: Payer: Self-pay

## 2019-03-09 ENCOUNTER — Ambulatory Visit (HOSPITAL_BASED_OUTPATIENT_CLINIC_OR_DEPARTMENT_OTHER): Payer: BLUE CROSS/BLUE SHIELD | Admitting: Student in an Organized Health Care Education/Training Program

## 2019-03-09 VITALS — BP 140/79 | HR 78 | Temp 98.4°F | Resp 14 | Ht 62.0 in | Wt 240.0 lb

## 2019-03-09 DIAGNOSIS — M47816 Spondylosis without myelopathy or radiculopathy, lumbar region: Secondary | ICD-10-CM | POA: Diagnosis present

## 2019-03-09 DIAGNOSIS — M25511 Pain in right shoulder: Secondary | ICD-10-CM | POA: Diagnosis present

## 2019-03-09 DIAGNOSIS — G8929 Other chronic pain: Secondary | ICD-10-CM | POA: Diagnosis present

## 2019-03-09 DIAGNOSIS — M67911 Unspecified disorder of synovium and tendon, right shoulder: Secondary | ICD-10-CM | POA: Diagnosis present

## 2019-03-09 DIAGNOSIS — M19011 Primary osteoarthritis, right shoulder: Secondary | ICD-10-CM

## 2019-03-09 DIAGNOSIS — M778 Other enthesopathies, not elsewhere classified: Secondary | ICD-10-CM

## 2019-03-09 DIAGNOSIS — M7581 Other shoulder lesions, right shoulder: Secondary | ICD-10-CM | POA: Insufficient documentation

## 2019-03-09 MED ORDER — IOHEXOL 180 MG/ML  SOLN
10.0000 mL | Freq: Once | INTRAMUSCULAR | Status: AC
  Administered 2019-03-09: 10 mL via EPIDURAL

## 2019-03-09 MED ORDER — LIDOCAINE HCL 2 % IJ SOLN
20.0000 mL | Freq: Once | INTRAMUSCULAR | Status: AC
  Administered 2019-03-09: 400 mg
  Filled 2019-03-09: qty 20

## 2019-03-09 MED ORDER — ROPIVACAINE HCL 2 MG/ML IJ SOLN
1.0000 mL | Freq: Once | INTRAMUSCULAR | Status: AC
  Administered 2019-03-09: 4 mL via EPIDURAL
  Filled 2019-03-09: qty 10

## 2019-03-09 MED ORDER — FENTANYL CITRATE (PF) 100 MCG/2ML IJ SOLN
25.0000 ug | INTRAMUSCULAR | Status: DC | PRN
  Administered 2019-03-09: 100 ug via INTRAVENOUS
  Filled 2019-03-09: qty 2

## 2019-03-09 MED ORDER — DEXAMETHASONE SODIUM PHOSPHATE 10 MG/ML IJ SOLN
10.0000 mg | Freq: Once | INTRAMUSCULAR | Status: AC
  Administered 2019-03-09: 10 mg
  Filled 2019-03-09: qty 1

## 2019-03-09 NOTE — Progress Notes (Signed)
Patient's Name: Stacy Martinez  MRN: [id number]  Referring Provider: Mikey College, *  DOB: Sep 28, 1955  PCP: Mikey College, NP  DOS: 03/09/2019  Note by: Gillis Santa, MD  Service setting: Ambulatory outpatient  Specialty: Interventional Pain Management  Patient type: Established  Location: [facility] (AMB) Pain Management Facility  Visit type: Interventional Procedure   Primary Reason for Visit: Interventional Pain Management Treatment. CC: Shoulder Pain (right)  Procedure:          Anesthesia, Analgesia, Anxiolysis:  Type: Diagnostic Glenohumeral Joint (shoulder) Injection #2  Primary Purpose: Diagnostic Region: Superior Shoulder Area Level:  Shoulder Target Area: Glenohumeral Joint (shoulder) Approach: Posterior approach. Laterality: Right-Sided  Type: Moderate (Conscious) Sedation combined with Local Anesthesia Indication(s): Analgesia and Anxiety Route: Intravenous (IV) IV Access: Secured Sedation: Meaningful verbal contact was maintained at all times during the procedure  Local Anesthetic: Lidocaine 1-2%  Position: Supine   Indications: RIGHT shoulder arthropathy  Pain Score: Pre-procedure: 10-Worst pain ever/10 Post-procedure: 0-No pain/10  Pre-op Assessment:  Ms. Capece is a 63 y.o. (year old), female patient, seen today for interventional treatment. She  has a past surgical history that includes Colon surgery; Hernia repair; and Wrist fracture surgery (Right). Ms. Whitenack has a current medication list which includes the following prescription(s): amlodipine, carvedilol, diclofenac, empagliflozin, enalapril, furosemide, gabapentin, glucose blood, insulin glargine, pen needles, methocarbamol, relion ultra thin lancets, rosuvastatin, and sitagliptin, and the following Facility-Administered Medications: bupivacaine (pf), cefazolin, fentanyl, fentanyl, lactated ringers, lactated ringers, lactated ringers, lidocaine (pf), midazolam, orphenadrine, sodium chloride flush, and  triamcinolone acetonide. Her primarily concern today is the Shoulder Pain (right)  Initial Vital Signs:  Pulse/HCG Rate: 78ECG Heart Rate: 78 Temp: 98.1 F (36.7 C) Resp: 20 BP: 123/66 SpO2: 98 %  BMI: Estimated body mass index is 43.9 kg/m as calculated from the following:   Height as of this encounter: 5\' 2"  (1.575 m).   Weight as of this encounter: 240 lb (108.9 kg).  Risk Assessment: Allergies: Reviewed. She is allergic to prednisone and metformin and related.  Allergy Precautions: None required Coagulopathies: Reviewed. None identified.  Blood-thinner therapy: None at this time Active Infection(s): Reviewed. None identified. Ms. Kassing is afebrile  Site Confirmation: Ms. Straker was asked to confirm the procedure and laterality before marking the site Procedure checklist: Completed Consent: Before the procedure and under the influence of no sedative(s), amnesic(s), or anxiolytics, the patient was informed of the treatment options, risks and possible complications. To fulfill our ethical and legal obligations, as recommended by the American Medical Association's Code of Ethics, I have informed the patient of my clinical impression; the nature and purpose of the treatment or procedure; the risks, benefits, and possible complications of the intervention; the alternatives, including doing nothing; the risk(s) and benefit(s) of the alternative treatment(s) or procedure(s); and the risk(s) and benefit(s) of doing nothing. The patient was provided information about the general risks and possible complications associated with the procedure. These may include, but are not limited to: failure to achieve desired goals, infection, bleeding, organ or nerve damage, allergic reactions, paralysis, and death. In addition, the patient was informed of those risks and complications associated to the procedure, such as failure to decrease pain; infection; bleeding; organ or nerve damage with subsequent damage  to sensory, motor, and/or autonomic systems, resulting in permanent pain, numbness, and/or weakness of one or several areas of the body; allergic reactions; (i.e.: anaphylactic reaction); and/or death. Furthermore, the patient was informed of those risks and complications associated with the  medications. These include, but are not limited to: allergic reactions (i.e.: anaphylactic or anaphylactoid reaction(s)); adrenal axis suppression; blood sugar elevation that in diabetics may result in ketoacidosis or comma; water retention that in patients with history of congestive heart failure may result in shortness of breath, pulmonary edema, and decompensation with resultant heart failure; weight gain; swelling or edema; medication-induced neural toxicity; particulate matter embolism and blood vessel occlusion with resultant organ, and/or nervous system infarction; and/or aseptic necrosis of one or more joints. Finally, the patient was informed that Medicine is not an exact science; therefore, there is also the possibility of unforeseen or unpredictable risks and/or possible complications that may result in a catastrophic outcome. The patient indicated having understood very clearly. We have given the patient no guarantees and we have made no promises. Enough time was given to the patient to ask questions, all of which were answered to the patient's satisfaction. Ms. Devlin has indicated that she wanted to continue with the procedure. Attestation: I, the ordering provider, attest that I have discussed with the patient the benefits, risks, side-effects, alternatives, likelihood of achieving goals, and potential problems during recovery for the procedure that I have provided informed consent. Date   Time: 03/09/2019 10:59 AM  Pre-Procedure Preparation:  Monitoring: As per clinic protocol. Respiration, ETCO2, SpO2, BP, heart rate and rhythm monitor placed and checked for adequate function Safety Precautions: Patient was  assessed for positional comfort and pressure points before starting the procedure. Time-out: I initiated and conducted the "Time-out" before starting the procedure, as per protocol. The patient was asked to participate by confirming the accuracy of the "Time Out" information. Verification of the correct person, site, and procedure were performed and confirmed by me, the nursing staff, and the patient. "Time-out" conducted as per Joint Commission's Universal Protocol (UP.01.01.01). Time: 1205  Description of Procedure:          Area Prepped: Entire shoulder Area Prepping solution: ChloraPrep (2% chlorhexidine gluconate and 70% isopropyl alcohol) Safety Precautions: Aspiration looking for blood return was conducted prior to all injections. At no point did we inject any substances, as a needle was being advanced. No attempts were made at seeking any paresthesias. Safe injection practices and needle disposal techniques used. Medications properly checked for expiration dates. SDV (single dose vial) medications used. Description of the Procedure: Protocol guidelines were followed. The patient was placed in position over the procedure table. The target area was identified and the area prepped in the usual manner. Skin & deeper tissues infiltrated with local anesthetic. Appropriate amount of time allowed to pass for local anesthetics to take effect. The procedure needles were then advanced to the target area. Proper needle placement secured. Negative aspiration confirmed. Solution injected in intermittent fashion, asking for systemic symptoms every 0.5cc of injectate. The needles were then removed and the area cleansed, making sure to leave some of the prepping solution back to take advantage of its long term bactericidal properties.    Vitals:   03/09/19 1215 03/09/19 1217 03/09/19 1227 03/09/19 1237  BP: 116/72 104/63 118/76 140/79  Pulse:      Resp: 17 19 19 14   Temp:  98.7 F (37.1 C)  98.4 F (36.9  C)  TempSrc:  Temporal  Temporal  SpO2: 98% 100% 98% 99%  Weight:      Height:        Start Time: 1205 hrs. End Time: 1212 hrs. Materials:  Needle(s) Type: Spinal Needle Gauge: 22G Length: 3.5-in Medication(s): Please see orders for  medications and dosing details. 5 cc solution made of 4cc of 0.2% Ropivacaine and 1 cc of Decadron 10 m/cc injected into R Glenohumeral joint (posterior approach) Imaging Guidance (Non-Spinal):          Type of Imaging Technique: Fluoroscopy Guidance (Non-Spinal) Indication(s): Assistance in needle guidance and placement for procedures requiring needle placement in or near specific anatomical locations not easily accessible without such assistance. Exposure Time: Please see nurses notes. Contrast: Before injecting any contrast, we confirmed that the patient did not have an allergy to iodine, shellfish, or radiological contrast. Once satisfactory needle placement was completed at the desired level, radiological contrast was injected. Contrast injected under live fluoroscopy. No contrast complications. See chart for type and volume of contrast used. Fluoroscopic Guidance: I was personally present during the use of fluoroscopy. "Tunnel Vision Technique" used to obtain the best possible view of the target area. Parallax error corrected before commencing the procedure. "Direction-depth-direction" technique used to introduce the needle under continuous pulsed fluoroscopy. Once target was reached, antero-posterior, oblique, and lateral fluoroscopic projection used confirm needle placement in all planes. Images permanently stored in EMR. Interpretation: I personally interpreted the imaging intraoperatively. Adequate needle placement confirmed in multiple planes. Appropriate spread of contrast into desired area was observed. No evidence of afferent or efferent intravascular uptake. Permanent images saved into the patient's record.  Antibiotic Prophylaxis:    Anti-infectives (From admission, onward)   None     Indication(s): None identified  Post-operative Assessment:  Post-procedure Vital Signs:  Pulse/HCG Rate: 7873 Temp: 98.4 F (36.9 C) Resp: 14 BP: 140/79 SpO2: 99 %  EBL: None  Complications: No immediate post-treatment complications observed by team, or reported by patient.  Note: The patient tolerated the entire procedure well. A repeat set of vitals were taken after the procedure and the patient was kept under observation following institutional policy, for this type of procedure. Post-procedural neurological assessment was performed, showing return to baseline, prior to discharge. The patient was provided with post-procedure discharge instructions, including a section on how to identify potential problems. Should any problems arise concerning this procedure, the patient was given instructions to immediately contact us, at any time, without hesitation. In any case, we plan to contact the patient by telephone for a follow-up status report regarding this interventional procedure.  Comments:  No additional relevant information.  Plan of Care    Imaging Orders     DG PAIN CLINIC C-ARM 1-60 MIN NO REPORT  Procedure Orders     LUMBAR FACET(MEDIAL BRANCH NERVE BLOCK) MBNB  Medications ordered for procedure: Meds ordered this encounter  Medications   iohexol (OMNIPAQUE) 180 MG/ML injection 10 mL    Must be Myelogram-compatible. If not available, you may substitute with a water-soluble, non-ionic, hypoallergenic, myelogram-compatible radiological contrast medium.   lidocaine (XYLOCAINE) 2 % (with pres) injection 400 mg   fentaNYL (SUBLIMAZE) injection 25-50 mcg    Make sure Narcan is available in the pyxis when using this medication. In the event of respiratory depression (RR< 8/min): Titrate NARCAN (naloxone) in increments of 0.1 to 0.2 mg IV at 2-3 minute intervals, until desired degree of reversal.   dexamethasone  (DECADRON) injection 10 mg   ropivacaine (PF) 2 mg/mL (0.2%) (NAROPIN) injection 1 mL   Medications administered: We administered iohexol, lidocaine, fentaNYL, dexamethasone, and ropivacaine (PF) 2 mg/mL (0.2%).  See the medical record for exact dosing, route, and time of administration.  Disposition: Discharge home  Discharge Date & Time: 03/09/2019; 1244 hrs.   Physician-requested  Follow-up: Return in about 1 week (around 03/16/2019) for Procedure.  Future Appointments  Date Time Provider [place]  03/16/2019  9:45 AM Gillis Santa, MD [facility] None  03/22/2019  8:20 AM Mikey College, NP [facility] None   Primary Care Physician: Mikey College, NP Location: [facility] Note by: Gillis Santa, MD Date: 03/09/2019; Time: 3:10 PM  Disclaimer:  Medicine is not an exact science. The only guarantee in medicine is that nothing is guaranteed. It is important to note that the decision to proceed with this intervention was based on the information collected from the patient. The Data and conclusions were drawn from the patient's questionnaire, the interview, and the physical examination. Because the information was provided in large part by the patient, it cannot be guaranteed that it has not been purposely or unconsciously manipulated. Every effort has been made to obtain as much relevant data as possible for this evaluation. It is important to note that the conclusions that lead to this procedure are derived in large part from the available data. Always take into account that the treatment will also be dependent on availability of resources and existing treatment guidelines, considered by other Pain Management Practitioners as being common knowledge and practice, at the time of the intervention. For Medico-Legal purposes, it is also important to point out that variation in procedural techniques and pharmacological choices are the acceptable norm. The  indications, contraindications, technique, and results of the above procedure should only be interpreted and judged by a Board-Certified Interventional Pain Specialist with extensive familiarity and expertise in the same exact procedure and technique.

## 2019-03-09 NOTE — Progress Notes (Signed)
Safety precautions to be maintained throughout the outpatient stay will include: orient to surroundings, keep bed in low position, maintain call bell within reach at all times, provide assistance with transfer out of bed and ambulation.  

## 2019-03-10 ENCOUNTER — Telehealth: Payer: Self-pay

## 2019-03-10 NOTE — Telephone Encounter (Signed)
Patient was called and no problems was reported. 

## 2019-03-11 ENCOUNTER — Other Ambulatory Visit: Admission: RE | Admit: 2019-03-11 | Payer: BLUE CROSS/BLUE SHIELD | Source: Ambulatory Visit

## 2019-03-14 ENCOUNTER — Telehealth: Payer: Self-pay | Admitting: *Deleted

## 2019-03-16 ENCOUNTER — Ambulatory Visit: Payer: BLUE CROSS/BLUE SHIELD | Admitting: Student in an Organized Health Care Education/Training Program

## 2019-03-16 ENCOUNTER — Other Ambulatory Visit: Payer: Self-pay

## 2019-03-16 ENCOUNTER — Other Ambulatory Visit
Admission: RE | Admit: 2019-03-16 | Discharge: 2019-03-16 | Disposition: A | Discharge status: Home / Self Care | Payer: BLUE CROSS/BLUE SHIELD | Source: Ambulatory Visit | Attending: Student in an Organized Health Care Education/Training Program | Admitting: Student in an Organized Health Care Education/Training Program

## 2019-03-16 DIAGNOSIS — Z1159 Encounter for screening for other viral diseases: Secondary | ICD-10-CM | POA: Diagnosis not present

## 2019-03-16 LAB — SARS CORONAVIRUS 2 (TAT 6-24 HRS): SARS Coronavirus 2: NEGATIVE

## 2019-03-21 ENCOUNTER — Other Ambulatory Visit: Payer: Self-pay

## 2019-03-21 ENCOUNTER — Encounter: Payer: Self-pay | Admitting: Student in an Organized Health Care Education/Training Program

## 2019-03-21 ENCOUNTER — Ambulatory Visit
Admission: RE | Admit: 2019-03-21 | Discharge: 2019-03-21 | Disposition: A | Discharge status: Home / Self Care | Payer: BLUE CROSS/BLUE SHIELD | Source: Ambulatory Visit | Attending: Student in an Organized Health Care Education/Training Program | Admitting: Student in an Organized Health Care Education/Training Program

## 2019-03-21 ENCOUNTER — Ambulatory Visit (HOSPITAL_BASED_OUTPATIENT_CLINIC_OR_DEPARTMENT_OTHER): Payer: BLUE CROSS/BLUE SHIELD | Admitting: Student in an Organized Health Care Education/Training Program

## 2019-03-21 VITALS — BP 133/67 | HR 86 | Temp 98.4°F | Resp 15 | Ht 62.0 in | Wt 240.0 lb

## 2019-03-21 DIAGNOSIS — M47816 Spondylosis without myelopathy or radiculopathy, lumbar region: Secondary | ICD-10-CM

## 2019-03-21 MED ORDER — DEXAMETHASONE SODIUM PHOSPHATE 10 MG/ML IJ SOLN
10.0000 mg | Freq: Once | INTRAMUSCULAR | Status: AC
  Administered 2019-03-21: 10 mg
  Filled 2019-03-21: qty 1

## 2019-03-21 MED ORDER — LIDOCAINE HCL 2 % IJ SOLN
20.0000 mL | Freq: Once | INTRAMUSCULAR | Status: AC
  Administered 2019-03-21: 14:00:00 400 mg
  Filled 2019-03-21: qty 40

## 2019-03-21 MED ORDER — TIZANIDINE HCL 4 MG PO TABS: 4.0000 mg | ORAL_TABLET | Freq: Two times a day (BID) | ORAL | 3 refills | Status: AC | PRN

## 2019-03-21 MED ORDER — ROPIVACAINE HCL 2 MG/ML IJ SOLN
2.0000 mL | Freq: Once | INTRAMUSCULAR | Status: AC
  Administered 2019-03-21: 2 mL via EPIDURAL
  Filled 2019-03-21: qty 10

## 2019-03-21 MED ORDER — ROPIVACAINE HCL 2 MG/ML IJ SOLN
1.0000 mL | Freq: Once | INTRAMUSCULAR | Status: AC
  Administered 2019-03-21: 1 mL via EPIDURAL
  Filled 2019-03-21: qty 10

## 2019-03-21 MED ORDER — DICLOFENAC SODIUM 75 MG PO TBEC: 75.0000 mg | DELAYED_RELEASE_TABLET | Freq: Two times a day (BID) | ORAL | 3 refills | Status: DC | PRN

## 2019-03-21 MED ORDER — FENTANYL CITRATE (PF) 100 MCG/2ML IJ SOLN
25.0000 ug | INTRAMUSCULAR | Status: DC | PRN
  Administered 2019-03-21: 75 ug via INTRAVENOUS
  Filled 2019-03-21: qty 2

## 2019-03-21 NOTE — Progress Notes (Signed)
Patient's Name: Stacy Martinez  MRN: [id number]  Referring Provider: Mikey College, *  DOB: 1955-10-11  PCP: Mikey College, NP  DOS: 03/21/2019  Note by: Gillis Santa, MD  Service setting: Ambulatory outpatient  Specialty: Interventional Pain Management  Patient type: Established  Location: [facility] (AMB) Pain Management Facility  Visit type: Interventional Procedure   Primary Reason for Visit: Interventional Pain Management Treatment. CC: Back Pain (bilateral, worse left side)  Procedure:       Anesthesia, Analgesia, Anxiolysis:  Type: Lumbar Facet, Medial Branch Block(s)        #2 in 2020 (had 6 done in 2019, q2 months)  Primary Purpose: Therapeutic Region: Posterolateral Lumbosacral Spine Level: L2, L3, L4, L5,Medial Branch Level(s). Injecting these levels blocks the L3-4, L4-5, and L5-S1 lumbar facet joints. Laterality: Bilateral  Type: Moderate (Conscious) Sedation combined with Local Anesthesia Indication(s): Analgesia and Anxiety Route: Intravenous (IV) IV Access: Secured Sedation: Meaningful verbal contact was maintained at all times during the procedure  Local Anesthetic: Lidocaine 2%   Indications: 1. Lumbar spondylosis    Pain Score: Pre-procedure: 10-Worst pain ever/10 Post-procedure: 0-No pain/10  Pre-op Assessment:  Stacy Martinez is a 63 y.o. (year old), female patient, seen today for interventional treatment. She  has a past surgical history that includes Colon surgery; Hernia repair; and Wrist fracture surgery (Right). Stacy Martinez has a current medication list which includes the following prescription(s): amlodipine, carvedilol, diclofenac, empagliflozin, enalapril, furosemide, gabapentin, glucose blood, insulin glargine, pen needles, methocarbamol, relion ultra thin lancets, rosuvastatin, sitagliptin, diclofenac, and tizanidine, and the following Facility-Administered Medications: bupivacaine (pf), cefazolin, fentanyl, fentanyl, lactated ringers, lactated ringers,  lactated ringers, lidocaine (pf), midazolam, orphenadrine, sodium chloride flush, and triamcinolone acetonide. Her primarily concern today is the Back Pain (bilateral, worse left side)  Initial Vital Signs:  Pulse/HCG Rate: 86ECG Heart Rate: 77 Temp: 98.4 F (36.9 C) Resp: 18 BP: (!) 146/76 SpO2: 95 %  BMI: Estimated body mass index is 43.9 kg/m as calculated from the following:   Height as of this encounter: 5\' 2"  (1.575 m).   Weight as of this encounter: 240 lb (108.9 kg).  Risk Assessment: Allergies: Reviewed. She is allergic to prednisone and metformin and related.  Allergy Precautions: None required Coagulopathies: Reviewed. None identified.  Blood-thinner therapy: None at this time Active Infection(s): Reviewed. None identified. [name] is afebrile  Site Confirmation: Stacy Martinez was asked to confirm the procedure and laterality before marking the site Procedure checklist: Completed Consent: Before the procedure and under the influence of no sedative(s), amnesic(s), or anxiolytics, the patient was informed of the treatment options, risks and possible complications. To fulfill our ethical and legal obligations, as recommended by the American Medical Association's Code of Ethics, I have informed the patient of my clinical impression; the nature and purpose of the treatment or procedure; the risks, benefits, and possible complications of the intervention; the alternatives, including doing nothing; the risk(s) and benefit(s) of the alternative treatment(s) or procedure(s); and the risk(s) and benefit(s) of doing nothing. The patient was provided information about the general risks and possible complications associated with the procedure. These may include, but are not limited to: failure to achieve desired goals, infection, bleeding, organ or nerve damage, allergic reactions, paralysis, and death. In addition, the patient was informed of those risks and complications associated to  Spine-related procedures, such as failure to decrease pain; infection (i.e.: Meningitis, epidural or intraspinal abscess); bleeding (i.e.: epidural hematoma, subarachnoid hemorrhage, or any other type of intraspinal or peri-dural  bleeding); organ or nerve damage (i.e.: Any type of peripheral nerve, nerve root, or spinal cord injury) with subsequent damage to sensory, motor, and/or autonomic systems, resulting in permanent pain, numbness, and/or weakness of one or several areas of the body; allergic reactions; (i.e.: anaphylactic reaction); and/or death. Furthermore, the patient was informed of those risks and complications associated with the medications. These include, but are not limited to: allergic reactions (i.e.: anaphylactic or anaphylactoid reaction(s)); adrenal axis suppression; blood sugar elevation that in diabetics may result in ketoacidosis or comma; water retention that in patients with history of congestive heart failure may result in shortness of breath, pulmonary edema, and decompensation with resultant heart failure; weight gain; swelling or edema; medication-induced neural toxicity; particulate matter embolism and blood vessel occlusion with resultant organ, and/or nervous system infarction; and/or aseptic necrosis of one or more joints. Finally, the patient was informed that Medicine is not an exact science; therefore, there is also the possibility of unforeseen or unpredictable risks and/or possible complications that may result in a catastrophic outcome. The patient indicated having understood very clearly. We have given the patient no guarantees and we have made no promises. Enough time was given to the patient to ask questions, all of which were answered to the patient's satisfaction. [name] has indicated that she wanted to continue with the procedure. Attestation: I, the ordering provider, attest that I have discussed with the patient the benefits, risks, side-effects, alternatives,  likelihood of achieving goals, and potential problems during recovery for the procedure that I have provided informed consent. Date  Time: 03/21/2019  1:23 PM  Pre-Procedure Preparation:  Monitoring: As per clinic protocol. Respiration, ETCO2, SpO2, BP, heart rate and rhythm monitor placed and checked for adequate function Safety Precautions: Patient was assessed for positional comfort and pressure points before starting the procedure. Time-out: I initiated and conducted the "Time-out" before starting the procedure, as per protocol. The patient was asked to participate by confirming the accuracy of the "Time Out" information. Verification of the correct person, site, and procedure were performed and confirmed by me, the nursing staff, and the patient. "Time-out" conducted as per Joint Commission's Universal Protocol (UP.01.01.01). Time: 1417  Description of Procedure:       Position: Prone Laterality: Bilateral. The procedure was performed in identical fashion on both sides. Levels:  L2, L3, L4, L5, Medial Branch Level(s) Area Prepped: Posterior Lumbosacral Region Prepping solution: ChloraPrep (2% chlorhexidine gluconate and 70% isopropyl alcohol) Safety Precautions: Aspiration looking for blood return was conducted prior to all injections. At no point did we inject any substances, as a needle was being advanced. Before injecting, the patient was told to immediately notify me if she was experiencing any new onset of "ringing in the ears, or metallic taste in the mouth". No attempts were made at seeking any paresthesias. Safe injection practices and needle disposal techniques used. Medications properly checked for expiration dates. SDV (single dose vial) medications used. After the completion of the procedure, all disposable equipment used was discarded in the proper designated medical waste containers. Local Anesthesia: Protocol guidelines were followed. The patient was positioned over the  fluoroscopy table. The area was prepped in the usual manner. The time-out was completed. The target area was identified using fluoroscopy. A 12-in long, straight, sterile hemostat was used with fluoroscopic guidance to locate the targets for each level blocked. Once located, the skin was marked with an approved surgical skin marker. Once all sites were marked, the skin (epidermis, dermis, and hypodermis), as  well as deeper tissues (fat, connective tissue and muscle) were infiltrated with a small amount of a short-acting local anesthetic, loaded on a 10cc syringe with a 25G, 1.5-in  Needle. An appropriate amount of time was allowed for local anesthetics to take effect before proceeding to the next step. Local Anesthetic: Lidocaine 2.0% The unused portion of the local anesthetic was discarded in the proper designated containers.  Technical explanation of process:  L2 Medial Branch Nerve Block (MBB): The target area for the L2 medial branch is at the junction of the postero-lateral aspect of the superior articular process and the superior, posterior, and medial edge of the transverse process of L3. Under fluoroscopic guidance, a Quincke needle was inserted until contact was made with os over the superior postero-lateral aspect of the pedicular shadow (target area). After negative aspiration for blood, 1 mL of the nerve block solution was injected without difficulty or complication. The needle was removed intact. L3 Medial Branch Nerve Block (MBB): The target area for the L3 medial branch is at the junction of the postero-lateral aspect of the superior articular process and the superior, posterior, and medial edge of the transverse process of L4. Under fluoroscopic guidance, a Quincke needle was inserted until contact was made with os over the superior postero-lateral aspect of the pedicular shadow (target area). After negative aspiration for blood, 104mL of the nerve block solution was injected without difficulty or  complication. The needle was removed intact. L4 Medial Branch Nerve Block (MBB): The target area for the L4 medial branch is at the junction of the postero-lateral aspect of the superior articular process and the superior, posterior, and medial edge of the transverse process of L5. Under fluoroscopic guidance, a Quincke needle was inserted until contact was made with os over the superior postero-lateral aspect of the pedicular shadow (target area). After negative aspiration for blood, 1 mL of the nerve block solution was injected without difficulty or complication. The needle was removed intact. L5 Medial Branch Nerve Block (MBB): The target area for the L5 medial branch is at the junction of the postero-lateral aspect of the superior articular process and the superior, posterior, and medial edge of the sacral ala. Under fluoroscopic guidance, a Quincke needle was inserted until contact was made with os over the superior postero-lateral aspect of the pedicular shadow (target area). After negative aspiration for blood,1  mL of the nerve block solution was injected without difficulty or complication. The needle was removed intact.  Procedural Needles: 22-gauge, 3.5-inch, Quincke needles used for all levels. Nerve block solution: 10 cc solution made of 9 cc of 0.2% ropivacaine, 1 cc of Decadron 10 mg/cc.  1 to 1.5 cc injected at each level bilaterally.  The unused portion of the solution was discarded in the proper designated containers.  Once the entire procedure was completed, the treated area was cleaned, making sure to leave some of the prepping solution back to take advantage of its long term bactericidal properties.   Illustration of the posterior view of the lumbar spine and the posterior neural structures. Laminae of L2 through S1 are labeled. DPRL5, dorsal primary ramus of L5; DPRS1, dorsal primary ramus of S1; DPR3, dorsal primary ramus of L3; FJ, facet (zygapophyseal) joint L3-L4; I, inferior  articular process of L4; LB1, lateral branch of dorsal primary ramus of L1; IAB, inferior articular branches from L3 medial branch (supplies L4-L5 facet joint); IBP, intermediate branch plexus; MB3, medial branch of dorsal primary ramus of L3; NR3, third lumbar nerve  root; S, superior articular process of L5; SAB, superior articular branches from L4 (supplies L4-5 facet joint also); TP3, transverse process of L3.  Vitals:   03/21/19 1435 03/21/19 1445 03/21/19 1455 03/21/19 1506  BP: 133/71 139/61 126/60 133/67  Pulse:      Resp: 15 13 17 15   Temp:      TempSrc:      SpO2: 97% 97% 100% 100%  Weight:      Height:        Start Time: 1417 hrs. End Time: 1435 hrs.  Imaging Guidance (Spinal):  Type of Imaging Technique: Fluoroscopy Guidance (Spinal) Indication(s): Assistance in needle guidance and placement for procedures requiring needle placement in or near specific anatomical locations not easily accessible without such assistance. Exposure Time: Please see nurses notes. Contrast: None used. Fluoroscopic Guidance: I was personally present during the use of fluoroscopy. "Tunnel Vision Technique" used to obtain the best possible view of the target area. Parallax error corrected before commencing the procedure. "Direction-depth-direction" technique used to introduce the needle under continuous pulsed fluoroscopy. Once target was reached, antero-posterior, oblique, and lateral fluoroscopic projection used confirm needle placement in all planes. Images permanently stored in EMR. Interpretation: No contrast injected. I personally interpreted the imaging intraoperatively. Adequate needle placement confirmed in multiple planes. Permanent images saved into the patient's record.  Antibiotic Prophylaxis:   Anti-infectives (From admission, onward)   None     Indication(s): None identified  Post-operative Assessment:  Post-procedure Vital Signs:  Pulse/HCG Rate: 8677 Temp: 98.4 F (36.9 C)  Resp: 15 BP: 133/67 SpO2: 100 %  EBL: None  Complications: No immediate post-treatment complications observed by team, or reported by patient.  Note: The patient tolerated the entire procedure well. A repeat set of vitals were taken after the procedure and the patient was kept under observation following institutional policy, for this type of procedure. Post-procedural neurological assessment was performed, showing return to baseline, prior to discharge. The patient was provided with post-procedure discharge instructions, including a section on how to identify potential problems. Should any problems arise concerning this procedure, the patient was given instructions to immediately contact us, at any time, without hesitation. In any case, we plan to contact the patient by telephone for a follow-up status report regarding this interventional procedure.  Comments:  No additional relevant information. 5 out of 5 strength bilateral lower extremity: Plantar flexion, dorsiflexion, knee flexion, knee extension.  Plan of Care    Imaging Orders     DG PAIN CLINIC C-ARM 1-60 MIN NO REPORT  Procedure Orders    No procedure(s) ordered today    Medications ordered for procedure: Meds ordered this encounter  Medications  . lidocaine (XYLOCAINE) 2 % (with pres) injection 400 mg  . fentaNYL (SUBLIMAZE) injection 25-50 mcg    Make sure Narcan is available in the pyxis when using this medication. In the event of respiratory depression (RR< 8/min): Titrate NARCAN (naloxone) in increments of 0.1 to 0.2 mg IV at 2-3 minute intervals, until desired degree of reversal.  . ropivacaine (PF) 2 mg/mL (0.2%) (NAROPIN) injection 1 mL  . dexamethasone (DECADRON) injection 10 mg  . ropivacaine (PF) 2 mg/mL (0.2%) (NAROPIN) injection 2 mL  . dexamethasone (DECADRON) injection 10 mg  . tiZANidine (ZANAFLEX) 4 MG tablet    Sig: Take 1 tablet (4 mg total) by mouth 2 (two) times daily as needed for muscle spasms.     Dispense:  60 tablet    Refill:  3    Do  not place this medication, or any other prescription from our practice, on "Automatic Refill". Patient may have prescription filled one day early if pharmacy is closed on scheduled refill date.  . diclofenac (VOLTAREN) 75 MG EC tablet    Sig: Take 1 tablet (75 mg total) by mouth 2 (two) times daily as needed for moderate pain.    Dispense:  60 tablet    Refill:  3   Medications administered: We administered lidocaine, fentaNYL, ropivacaine (PF) 2 mg/mL (0.2%), dexamethasone, ropivacaine (PF) 2 mg/mL (0.2%), and dexamethasone.  See the medical record for exact dosing, route, and time of administration.  New Prescriptions   DICLOFENAC (VOLTAREN) 75 MG EC TABLET    Take 1 tablet (75 mg total) by mouth 2 (two) times daily as needed for moderate pain.   TIZANIDINE (ZANAFLEX) 4 MG TABLET    Take 1 tablet (4 mg total) by mouth 2 (two) times daily as needed for muscle spasms.   Disposition: Discharge home  Discharge Date & Time: 03/21/2019; 1510 hrs.   Physician-requested Follow-up: Return in about 6 weeks (around 05/02/2019) for Post Procedure Evaluation, virtual.  Future Appointments  Date Time Provider Gillis  03/22/2019  8:20 AM Mikey College, NP [facility] None  04/28/2019 10:00 AM Gillis Santa, MD [facility] None   Primary Care Physician: Mikey College, NP Location: [facility] Facility Note by: Gillis Santa, MD Date: 03/21/2019; Time: 3:53 PM  Disclaimer:  Medicine is not an exact science. The only guarantee in medicine is that nothing is guaranteed. It is important to note that the decision to proceed with this intervention was based on the information collected from the patient. The Data and conclusions were drawn from the patient's questionnaire, the interview, and the physical examination. Because the information was provided in large part by the patient, it cannot be guaranteed that it has not been  purposely or unconsciously manipulated. Every effort has been made to obtain as much relevant data as possible for this evaluation. It is important to note that the conclusions that lead to this procedure are derived in large part from the available data. Always take into account that the treatment will also be dependent on availability of resources and existing treatment guidelines, considered by other Pain Management Practitioners as being common knowledge and practice, at the time of the intervention. For Medico-Legal purposes, it is also important to point out that variation in procedural techniques and pharmacological choices are the acceptable norm. The indications, contraindications, technique, and results of the above procedure should only be interpreted and judged by a Board-Certified Interventional Pain Specialist with extensive familiarity and expertise in the same exact procedure and technique.

## 2019-03-21 NOTE — Patient Instructions (Signed)

## 2019-03-21 NOTE — Progress Notes (Signed)
Safety precautions to be maintained throughout the outpatient stay will include: orient to surroundings, keep bed in low position, maintain call bell within reach at all times, provide assistance with transfer out of bed and ambulation.  

## 2019-03-22 ENCOUNTER — Ambulatory Visit (INDEPENDENT_AMBULATORY_CARE_PROVIDER_SITE_OTHER): Payer: BLUE CROSS/BLUE SHIELD | Admitting: Nurse Practitioner

## 2019-03-22 ENCOUNTER — Telehealth: Payer: Self-pay

## 2019-03-22 VITALS — BP 125/51 | HR 76 | Ht 62.0 in | Wt 253.0 lb

## 2019-03-22 DIAGNOSIS — Z114 Encounter for screening for human immunodeficiency virus [HIV]: Secondary | ICD-10-CM | POA: Diagnosis not present

## 2019-03-22 DIAGNOSIS — I10 Essential (primary) hypertension: Secondary | ICD-10-CM | POA: Diagnosis not present

## 2019-03-22 DIAGNOSIS — E1142 Type 2 diabetes mellitus with diabetic polyneuropathy: Secondary | ICD-10-CM

## 2019-03-22 LAB — POCT GLYCOSYLATED HEMOGLOBIN (HGB A1C): Hemoglobin A1C: 7.6 % — AB (ref 4.0–5.6)

## 2019-03-22 MED ORDER — BASAGLAR KWIKPEN 100 UNIT/ML ~~LOC~~ SOPN: 36.0000 [IU] | PEN_INJECTOR | Freq: Every day | SUBCUTANEOUS | 1 refills | Status: DC

## 2019-03-22 NOTE — Progress Notes (Signed)
Subjective:    Patient ID: Stacy Martinez, female    DOB: Nov 30, 1955, 63 y.o.   MRN: [id number]  Stacy Martinez is a 63 y.o. female presenting on 03/22/2019 for Diabetes and Hypertension  HPI Diabetes Pt presents today for follow up of Type 2 diabetes mellitus. She is checking fasting am CBG at home with a range of 180-190; now with Lantus is 250-314.  Also checked at 11 am and had reading at 146.   - Current diabetic medications include: Lantus 34 units daily, Jardiance 25 mg once daily, Januvia 100 mg daily - She is not currently symptomatic. Reports early morning nausea over last 3 months with change to Lantus from [place]. - She denies polydipsia, polyphagia, polyuria, headaches, diaphoresis, shakiness, chills, pain, numbness or tingling in extremities and changes in vision.   - Clinical course has been stable. - She  reports an exercise routine that includes walking at work, 5 days per week. - Her diet is moderate in salt, low in fat, and low in carbohydrates. - Weight trend: stable  PREVENTION: Eye exam current (within one year): no Foot exam current (within one year): yes  Lipid/ASCVD risk reduction - on statin: yes Kidney protection - on ace or arb: yes Recent Labs    06/11/18 1022 11/26/18 1016 03/22/19 0909  HGBA1C 9.3* 7.4* 7.6*    Hypertension - She is not checking BP at home or outside of clinic.    - Current medications: amlodipine 10 mg once daily, Carvedilol 25 mg twice daily, enalapril 20 mg twice daily, Lasix 80 mg daily, tolerating well without side effects - She is not currently symptomatic. - Pt denies headache, lightheadedness, dizziness, changes in vision, chest tightness/pressure, palpitations, leg swelling, sudden loss of speech or loss of consciousness.  Social History   Tobacco Use  . Smoking status: Never Smoker  . Smokeless tobacco: Never Used  Substance Use Topics  . Alcohol use: No    Alcohol/week: 0.0 standard drinks  . Drug use: No   Review  of Systems  HENT: Positive for tinnitus (bilaterally).    Per HPI unless specifically indicated above     Objective:    BP (!) 125/51 (BP Location: Left Arm, Patient Position: Sitting, Cuff Size: Large)   Pulse 76   Ht 5\' 2"  (1.575 m)   Wt 253 lb (114.8 kg)   BMI 46.27 kg/m   Wt Readings from Last 3 Encounters:  03/22/19 253 lb (114.8 kg)  03/21/19 240 lb (108.9 kg)  03/09/19 240 lb (108.9 kg)    Physical Exam Vitals signs reviewed.  Constitutional:      General: She is awake. She is not in acute distress.    Appearance: She is well-developed. She is obese.  HENT:     Head: Normocephalic and atraumatic.     Right Ear: Tympanic membrane, ear canal and external ear normal.     Left Ear: Tympanic membrane, ear canal and external ear normal.  Neck:     Musculoskeletal: Normal range of motion and neck supple.     Vascular: No carotid bruit.  Cardiovascular:     Rate and Rhythm: Normal rate and regular rhythm.     Pulses:          Radial pulses are 2+ on the right side and 2+ on the left side.       Posterior tibial pulses are 1+ on the right side and 1+ on the left side.     Heart sounds:  Normal heart sounds, S1 normal and S2 normal.  Pulmonary:     Effort: Pulmonary effort is normal. No respiratory distress.     Breath sounds: Normal breath sounds and air entry.  Skin:    General: Skin is warm and dry.     Capillary Refill: Capillary refill takes less than 2 seconds.  Neurological:     General: No focal deficit present.     Mental Status: She is alert and oriented to person, place, and time. Mental status is at baseline.  Psychiatric:        Attention and Perception: Attention normal.        Mood and Affect: Mood and affect normal.        Behavior: Behavior normal. Behavior is cooperative.        Thought Content: Thought content normal.        Judgment: Judgment normal.     Results for orders placed or performed during the hospital encounter of 03/16/19  SARS  Coronavirus 2 (Performed in [facility] hospital lab)   Specimen: Nasal Swab  Result Value Ref Range   SARS Coronavirus 2 NEGATIVE NEGATIVE      Assessment & Plan:   Problem List Items Addressed This Visit      Cardiovascular and Mediastinum   BP (high blood pressure) Stable today on exam.  Medications tolerated without side effects.  Continue at current doses.  Refills provided.  Check labs today. Followup 3 months.    Relevant Orders   COMPLETE METABOLIC PANEL WITH GFR (Completed)     Nervous and Auditory   DM type 2 with diabetic peripheral neuropathy ([place]) - Primary Remains above goal A1c < 7%, but isStable today on exam.  Medications tolerated without side effects except for Lantus. Patient has nausea and desires to resume basaglar.  Continue at current doses except for Basaglar.  Take Basaglar 36 units once daily.  Refills provided.  Check labs today. Followup 3 months.    Relevant Medications   Insulin Glargine (BASAGLAR KWIKPEN) 100 UNIT/ML SOPN   Other Relevant Orders   POCT glycosylated hemoglobin (Hb A1C) (Completed)   COMPLETE METABOLIC PANEL WITH GFR (Completed)   Lipid panel (Completed)    Other Visit Diagnoses    Screening for HIV without presence of risk factors       Relevant Orders   HIV Antibody (routine testing w rflx) (Completed)      Meds ordered this encounter  Medications  . Insulin Glargine (BASAGLAR KWIKPEN) 100 UNIT/ML SOPN    Sig: Inject 0.36 mLs (36 Units total) into the skin daily.    Dispense:  11 pen    Refill:  1    Order Specific Question:   Supervising Provider    Answer:   Olin Hauser [[id number]]   Follow up plan: Return in about 3 months (around 06/22/2019) for diabetes AND for annual physical with PAP in next 1-2 months.  Cassell Smiles, DNP, AGPCNP-BC Adult Gerontology Primary Care Nurse Practitioner [facility] 03/22/2019, 8:41 AM

## 2019-03-22 NOTE — Telephone Encounter (Signed)
Patient was called and no problems reported. 

## 2019-03-22 NOTE — Patient Instructions (Addendum)
Stacy Martinez,   Thank you for coming in to clinic today.  1. Change to Basaglar - INCREASE to 36 units per day. - Continue Januvia and Jardiance without changes  2. Blood pressure looks great! - No changes to blood pressure meds today.  3. Anytime after 2 weeks, you can come for pneumonia and tetanus vaccines. We can do these at an annual physical if needed.  Please schedule a follow-up appointment with Stacy Martinez, AGNP. Return in about 3 months (around 06/22/2019) for diabetes AND for annual physical with PAP in next 1-2 months.  If you have any other questions or concerns, please feel free to call the clinic or send a message through [place]. You may also schedule an earlier appointment if necessary.  You will receive a survey after today's visit either digitally by e-mail or paper by [employer]. Your experiences and feedback matter to [place].  Please respond so we know how we are doing as we provide care for you.   Stacy Smiles, DNP, AGNP-BC Adult Gerontology Nurse Practitioner [place] [facility]

## 2019-03-23 ENCOUNTER — Other Ambulatory Visit: Payer: Self-pay | Admitting: Nurse Practitioner

## 2019-03-23 ENCOUNTER — Encounter: Payer: Self-pay | Admitting: Nurse Practitioner

## 2019-03-23 DIAGNOSIS — E785 Hyperlipidemia, unspecified: Secondary | ICD-10-CM

## 2019-03-23 DIAGNOSIS — E1169 Type 2 diabetes mellitus with other specified complication: Secondary | ICD-10-CM

## 2019-03-23 LAB — COMPLETE METABOLIC PANEL WITH GFR
AG Ratio: 1.3 (calc) (ref 1.0–2.5)
ALT: 20 U/L (ref 6–29)
AST: 15 U/L (ref 10–35)
Albumin: 4.3 g/dL (ref 3.6–5.1)
Alkaline phosphatase (APISO): 91 U/L (ref 37–153)
BUN/Creatinine Ratio: 37 (calc) — ABNORMAL HIGH (ref 6–22)
BUN: 27 mg/dL — ABNORMAL HIGH (ref 7–25)
CO2: 20 mmol/L (ref 20–32)
Calcium: 10.2 mg/dL (ref 8.6–10.4)
Chloride: 102 mmol/L (ref 98–110)
Creat: 0.73 mg/dL (ref 0.50–0.99)
GFR, Est African American: 102 mL/min/{1.73_m2} (ref >=60)
GFR, Est Non African American: 88 mL/min/{1.73_m2} (ref >=60)
Globulin: 3.3 g/dL (calc) (ref 1.9–3.7)
Glucose, Bld: 193 mg/dL — ABNORMAL HIGH (ref 65–139)
Potassium: 4.7 mmol/L (ref 3.5–5.3)
Sodium: 136 mmol/L (ref 135–146)
Total Bilirubin: 0.5 mg/dL (ref 0.2–1.2)
Total Protein: 7.6 g/dL (ref 6.1–8.1)

## 2019-03-23 LAB — LIPID PANEL
Cholesterol: 218 mg/dL — ABNORMAL HIGH (ref <200)
HDL: 56 mg/dL (ref >=50)
LDL Cholesterol (Calc): 137 mg/dL (calc) — ABNORMAL HIGH
Non-HDL Cholesterol (Calc): 162 mg/dL (calc) — ABNORMAL HIGH (ref <130)
Total CHOL/HDL Ratio: 3.9 (calc) (ref <5.0)
Triglycerides: 125 mg/dL (ref <150)

## 2019-03-23 LAB — HIV ANTIBODY (ROUTINE TESTING W REFLEX): HIV 1&2 Ab, 4th Generation: NONREACTIVE

## 2019-03-23 MED ORDER — ROSUVASTATIN CALCIUM 20 MG PO TABS: 20.0000 mg | ORAL_TABLET | Freq: Every day | ORAL | 1 refills | Status: DC

## 2019-04-27 ENCOUNTER — Encounter: Payer: Self-pay | Admitting: Student in an Organized Health Care Education/Training Program

## 2019-04-28 ENCOUNTER — Other Ambulatory Visit: Payer: Self-pay

## 2019-04-28 ENCOUNTER — Encounter: Payer: Self-pay | Admitting: Student in an Organized Health Care Education/Training Program

## 2019-04-28 ENCOUNTER — Ambulatory Visit
Payer: BLUE CROSS/BLUE SHIELD | Attending: Student in an Organized Health Care Education/Training Program | Admitting: Student in an Organized Health Care Education/Training Program

## 2019-04-28 DIAGNOSIS — M67911 Unspecified disorder of synovium and tendon, right shoulder: Secondary | ICD-10-CM | POA: Diagnosis not present

## 2019-04-28 DIAGNOSIS — M5136 Other intervertebral disc degeneration, lumbar region: Secondary | ICD-10-CM

## 2019-04-28 DIAGNOSIS — M25511 Pain in right shoulder: Secondary | ICD-10-CM | POA: Diagnosis not present

## 2019-04-28 DIAGNOSIS — G8929 Other chronic pain: Secondary | ICD-10-CM

## 2019-04-28 DIAGNOSIS — M19011 Primary osteoarthritis, right shoulder: Secondary | ICD-10-CM | POA: Diagnosis not present

## 2019-04-28 DIAGNOSIS — M7581 Other shoulder lesions, right shoulder: Secondary | ICD-10-CM

## 2019-04-28 DIAGNOSIS — M778 Other enthesopathies, not elsewhere classified: Secondary | ICD-10-CM

## 2019-04-28 DIAGNOSIS — M47816 Spondylosis without myelopathy or radiculopathy, lumbar region: Secondary | ICD-10-CM | POA: Insufficient documentation

## 2019-04-28 NOTE — Progress Notes (Signed)
Pain Management Virtual Encounter Note - Virtual Visit via [place] (real-time audio visits between healthcare provider and patient).   Patient's Phone No. & Preferred Pharmacy:  [phone] (home); [phone] (mobile); (Preferred) [phone] [email or web address]  [facility] [address] [place] [address] Phone: [phone] Fax: [phone]    Pre-screening note:  Our staff contacted Stacy Martinez and offered her an "in person", "face-to-face" appointment versus a telephone encounter. She indicated preferring the telephone encounter, at this time.   Reason for Virtual Visit: COVID-19*  Social distancing based on CDC and AMA recommendations.   I contacted Stacy Martinez on 04/28/2019 via video conference.      I clearly identified myself as Gillis Santa, MD. I verified that I was speaking with the correct person using two identifiers (Name: Stacy Martinez, and date of birth: [birth year]).  Advanced Informed Consent I sought verbal advanced consent from Stacy Martinez for virtual visit interactions. I informed Stacy Martinez of possible security and privacy concerns, risks, and limitations associated with providing "not-in-person" medical evaluation and management services. I also informed Stacy Martinez of the availability of "in-person" appointments. Finally, I informed her that there would be a charge for the virtual visit and that she could be  personally, fully or partially, financially responsible for it. Stacy Martinez expressed understanding and agreed to proceed.   Historic Elements   Stacy Martinez is a 63 y.o. year old, female patient evaluated today after her last encounter by our practice on 03/22/2019. Stacy Martinez  has a past medical history of Allergy, Arthritis, Diabetes mellitus without complication ([place]), Enlarged heart, and Hypertension. She also  has a past surgical history that includes Colon surgery; Hernia repair; and Wrist fracture  surgery (Right). Stacy Martinez has a current medication list which includes the following prescription(s): amlodipine, carvedilol, diclofenac, diclofenac, empagliflozin, enalapril, furosemide, gabapentin, glucose blood, basaglar kwikpen, pen needles, methocarbamol, relion ultra thin lancets, rosuvastatin, sitagliptin, and tizanidine, and the following Facility-Administered Medications: bupivacaine (pf), cefazolin, fentanyl, lactated ringers, lactated ringers, lactated ringers, lidocaine (pf), midazolam, orphenadrine, sodium chloride flush, and triamcinolone acetonide. She  reports that she has never smoked. She has never used smokeless tobacco. She reports that she does not drink alcohol or use drugs. Stacy Martinez is allergic to prednisone and metformin and related.   HPI  Today, she is being contacted for a post-procedure assessment.  Evaluation of last interventional procedure  03/21/2019 Procedure:  Type: Lumbar Facet, Medial Branch Block(s) #2 in 2020 (had 6 done in 2019, q2 months)  Primary Purpose: Therapeutic Region: Posterolateral Lumbosacral Spine Level: L2, L3, L4, L5,Medial Branch Level(s). Injecting these levels blocks the L3-4, L4-5, and L5-S1 lumbar facet joints. Laterality: Bilateral  Pre-procedure pain score:  10/10 Post-procedure pain score: 0/10         Influential Factors: Intra-procedural challenges: None observed.         Reported side-effects: None.        Post-procedural adverse reactions or complications: None reported         Sedation: Please see nurses note for DOS. When no sedatives are used, the analgesic levels obtained are directly associated to the effectiveness of the local anesthetics. However, when sedation is provided, the level of analgesia obtained during the initial 1 hour following the intervention, is believed to be the result of a combination of factors. These factors may include, but are not limited to: 1. The effectiveness of the local anesthetics used. 2.  The  effects of the analgesic(s) and/or anxiolytic(s) used. 3. The degree of discomfort experienced by the patient at the time of the procedure. 4. The patients ability and reliability in recalling and recording the events. 5. The presence and influence of possible secondary gains and/or psychosocial factors. Reported result: Relief experienced during the 1st hour after the procedure:100%   (Ultra-Short Term Relief)            Interpretative annotation: Clinically appropriate result. Analgesia during this period is likely to be Local Anesthetic and/or IV Sedative (Analgesic/Anxiolytic) related.          Effects of local anesthetic: The analgesic effects attained during this period are directly associated to the localized infiltration of local anesthetics and therefore cary significant diagnostic value as to the etiological location, or anatomical origin, of the pain. Expected duration of relief is directly dependent on the pharmacodynamics of the local anesthetic used. Long-acting (4-6 hours) anesthetics used.  Reported result: Relief during the next 4 to 6 hour after the procedure:100%   (Short-Term Relief)            Interpretative annotation: Clinically appropriate result. Analgesia during this period is likely to be Local Anesthetic-related.          Long-term benefit: Defined as the period of time past the expected duration of local anesthetics (1 hour for short-acting and 4-6 hours for long-acting). With the possible exception of prolonged sympathetic blockade from the local anesthetics, benefits during this period are typically attributed to, or associated with, other factors such as analgesic sensory neuropraxia, antiinflammatory effects, or beneficial biochemical changes provided by agents other than the local anesthetics.  Reported result: Extended relief following procedure:80% that is on-going   (Long-Term Relief)            Interpretative annotation: Clinically appropriate result. Good relief.  No permanent benefit expected. Inflammation plays a part in the etiology to the pain.          Laboratory Chemistry Profile (12 mo)  Renal: 03/22/2019: BUN 27; BUN/Creatinine Ratio 37; Creat 0.73  Lab Results  Component Value Date   GFRAA 102 03/22/2019   GFRNONAA 88 03/22/2019   Hepatic: No results found for requested labs within last 8760 hours. Lab Results  Component Value Date   AST 15 03/22/2019   ALT 20 03/22/2019   Other: No results found for requested labs within last 8760 hours. Note: Above Lab results reviewed.  Imaging  Last 90 days:  Dg Pain Clinic C-arm 1-60 Min No Report  Result Date: 03/21/2019 Fluoro was used, but no Radiologist interpretation will be provided. Please refer to "NOTES" tab for provider progress note.  Dg Pain Clinic C-arm 1-60 Min No Report  Result Date: 03/09/2019 Fluoro was used, but no Radiologist interpretation will be provided. Please refer to "NOTES" tab for provider progress note.   Assessment  The primary encounter diagnosis was Primary osteoarthritis of right shoulder. Diagnoses of Chronic right shoulder pain, Disorder of right rotator cuff, Right shoulder tendonitis, Lumbar spondylosis, Facet syndrome, lumbar, and DDD (degenerative disc disease), lumbar were also pertinent to this visit.  1. Primary osteoarthritis of right shoulder -Right x-ray with mild to moderate degenerative changes of the before meals and glenohumeral joints.  Patient is status post 2 glenohumeral joint steroid injections for her right shoulder with a second 1 not providing as much pain relief.  Given worsening pain and limitations in shoulder abduction, recommend referral to orthopedics - Ambulatory referral to Orthopedic Surgery  2.  Chronic right shoulder pain As above - Ambulatory referral to Orthopedic Surgery  3. Disorder of right rotator cuff As above - Ambulatory referral to Orthopedic Surgery  4. Right shoulder tendonitis As above - Ambulatory referral  to Orthopedic Surgery  5. Lumbar spondylosis -Patient finds good benefit with every 2 -3 month therapeutic lumbar facet medial branch nerve blocks at L2, L3, L4, L5.  Can repeat after October 1. - LUMBAR FACET(MEDIAL BRANCH NERVE BLOCK) MBNB; Standing  6. Facet syndrome, lumbar -As above - LUMBAR FACET(MEDIAL BRANCH NERVE BLOCK) MBNB; Standing  7. DDD (degenerative disc disease), lumbar -Stable  Orders:  Orders Placed This Encounter  Procedures  . LUMBAR FACET(MEDIAL BRANCH NERVE BLOCK) MBNB    Standing Status:   Standing    Number of Occurrences:   5    Standing Expiration Date:   04/27/2020    Scheduling Instructions:     Purpose: Diagnostic     Indication: Axial low back pain. Lumbosacral Spondylosis (M47.897).      Side: Bilateral     Level: L3-4, L4-5, & L5-S1 Facets (L2, L3, L4, L5,  Medial Branch Nerves)     Sedation: With Sedation.     TIMEFRAME: PRN procedure. (Stacy Martinez will call when needed.)    Order Specific Question:   Where will this procedure be performed?    Answer:   [facility]  . Ambulatory referral to Orthopedic Surgery    Referral Priority:   Routine    Referral Type:   Surgical    Referral Reason:   Specialty Services Required    Requested Specialty:   Orthopedic Surgery    Number of Visits Requested:   1   Follow-up plan:   Return if symptoms worsen or fail to improve.     Status post L2, L3, L4, L5 facet medial branch nerve block on 03/21/2019: Helpful.  Status post right shoulder steroid injection #2 on 03/09/2019 which was not helpful, having worsening shoulder pain.   Recent Visits Date Type Provider Dept  03/21/19 Procedure visit Gillis Santa, MD [facility]  03/09/19 Procedure visit Gillis Santa, MD [facility]  Showing recent visits within past 90 days and meeting all other requirements   Today's Visits Date Type Provider Dept  04/28/19 Office Visit Gillis Santa, MD [facility]  Showing today's visits  and meeting all other requirements   Future Appointments No visits were found meeting these conditions.  Showing future appointments within next 90 days and meeting all other requirements   I discussed the assessment and treatment plan with the patient. The patient was provided an opportunity to ask questions and all were answered. The patient agreed with the plan and demonstrated an understanding of the instructions.  Patient advised to call back or seek an in-person evaluation if the symptoms or condition worsens.  Total duration of non-face-to-face encounter: 25 minutes.  Note by: Gillis Santa, MD Date: 04/28/2019; Time: 11:18 AM  Note: This dictation was prepared with Dragon dictation. Any transcriptional errors that may result from this process are unintentional.  Disclaimer:  * Given the special circumstances of the COVID-19 pandemic, the federal government has announced that the Office for Civil Rights (OCR) will exercise its enforcement discretion and will not impose penalties on physicians using telehealth in the event of noncompliance with regulatory requirements under the [place] (HIPAA) in connection with the good faith provision of telehealth during the XX123456 national public health emergency. ([place])

## 2019-04-28 NOTE — Patient Instructions (Signed)
Referral to orthopedics PRN repeat lumbar facet blocks after October

## 2019-05-19 IMAGING — CR DG LUMBAR SPINE COMPLETE W/ BEND
1 series · 7 of 7 positions shown · non-contrast
Comparison: CT abdomen pelvis of 12/18/2013

CLINICAL DATA: Low back pain, no injury

EXAM:
LUMBAR SPINE - COMPLETE WITH BENDING VIEWS

[Series 1: dg lumbar spine complete w/bend 6+v · 0.14mm/px · 7 of 7 slices shown]
[im 1/7]
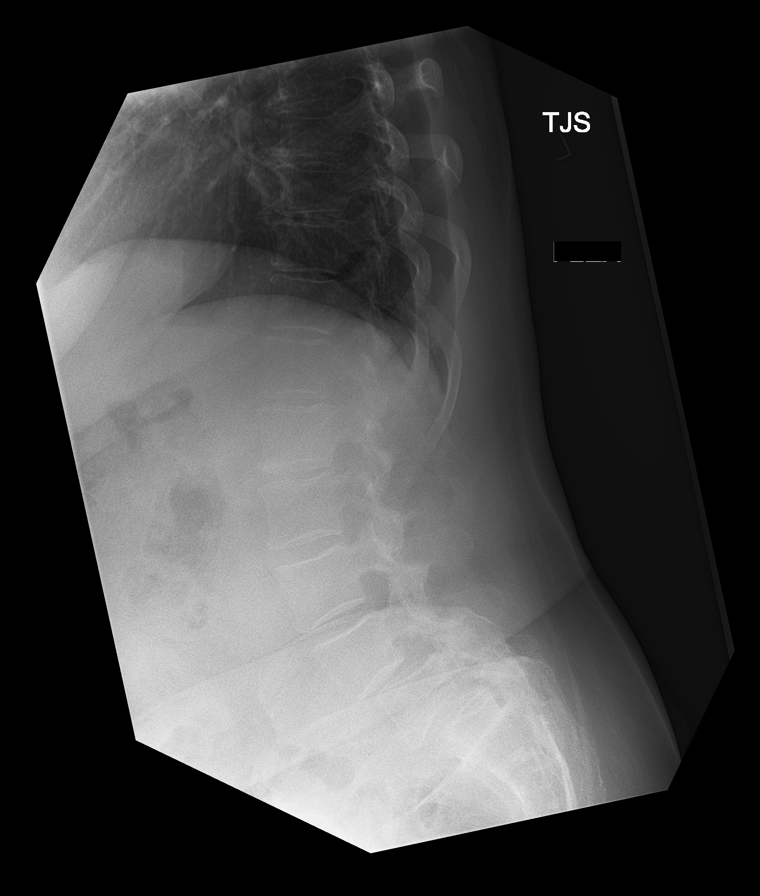
[im 2/7]
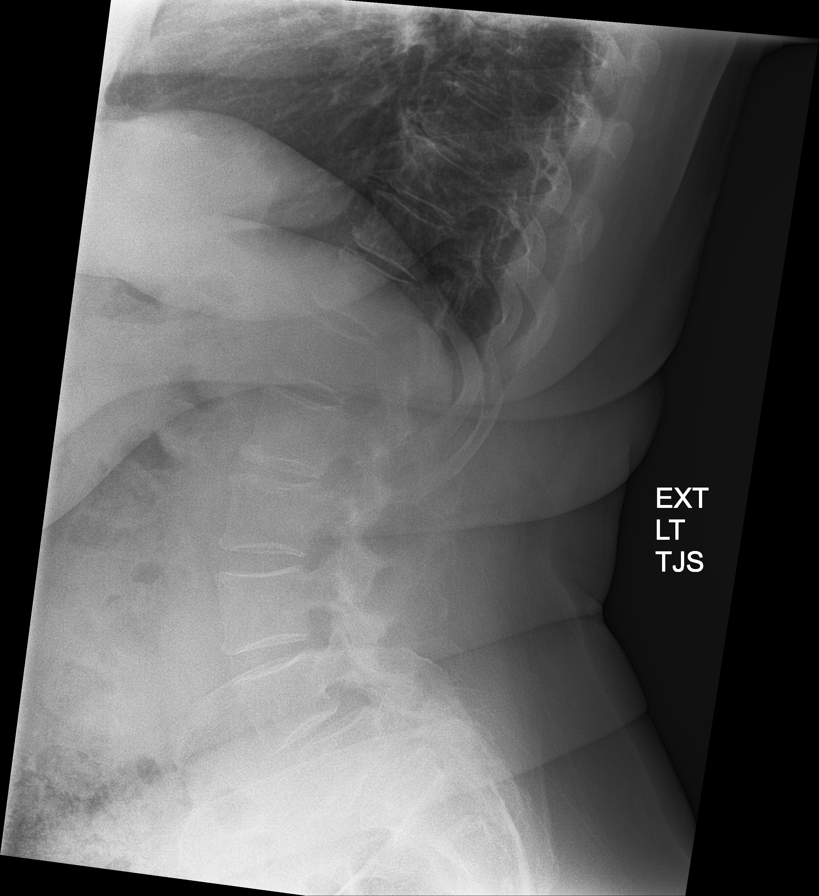
[im 3/7]
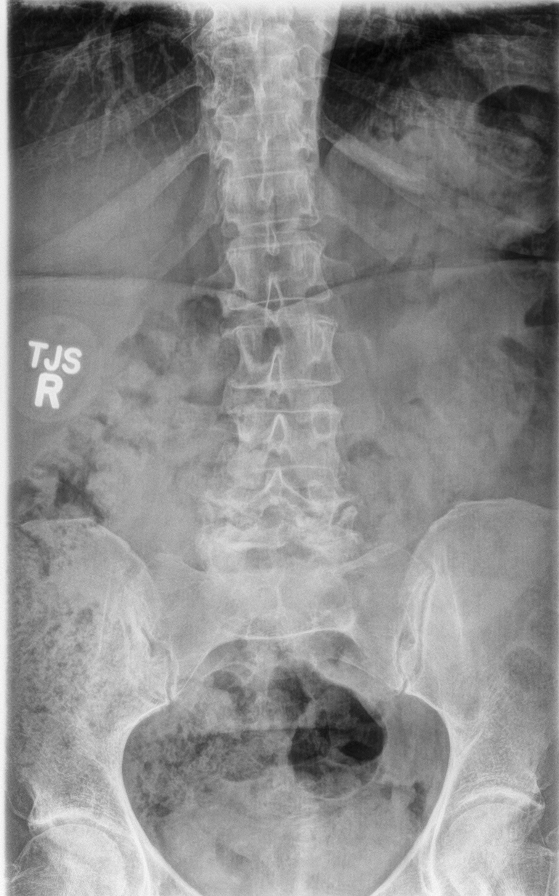
[im 4/7]
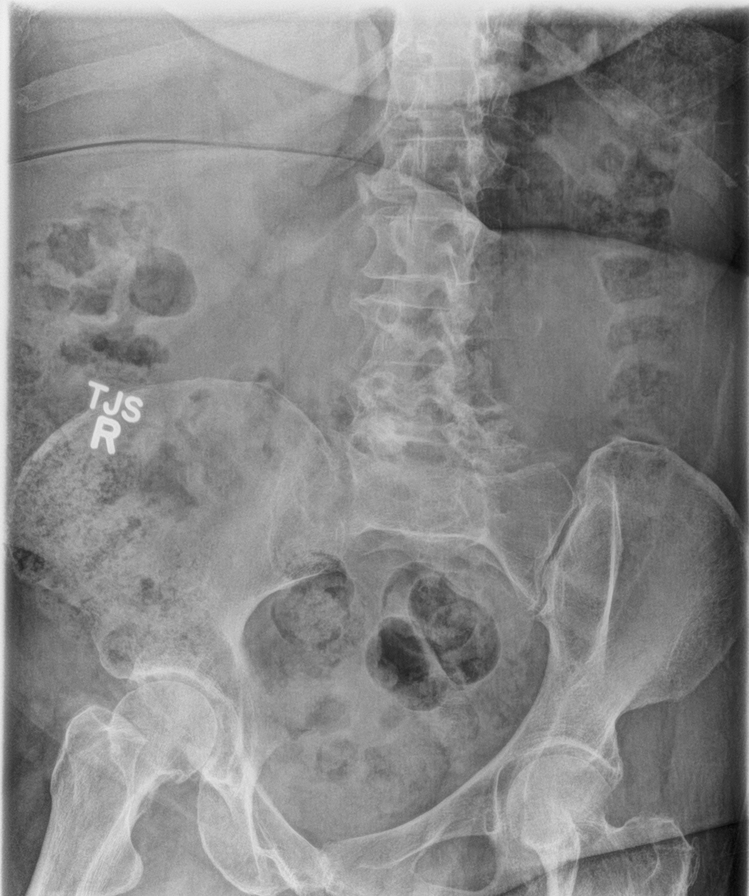
[im 5/7]
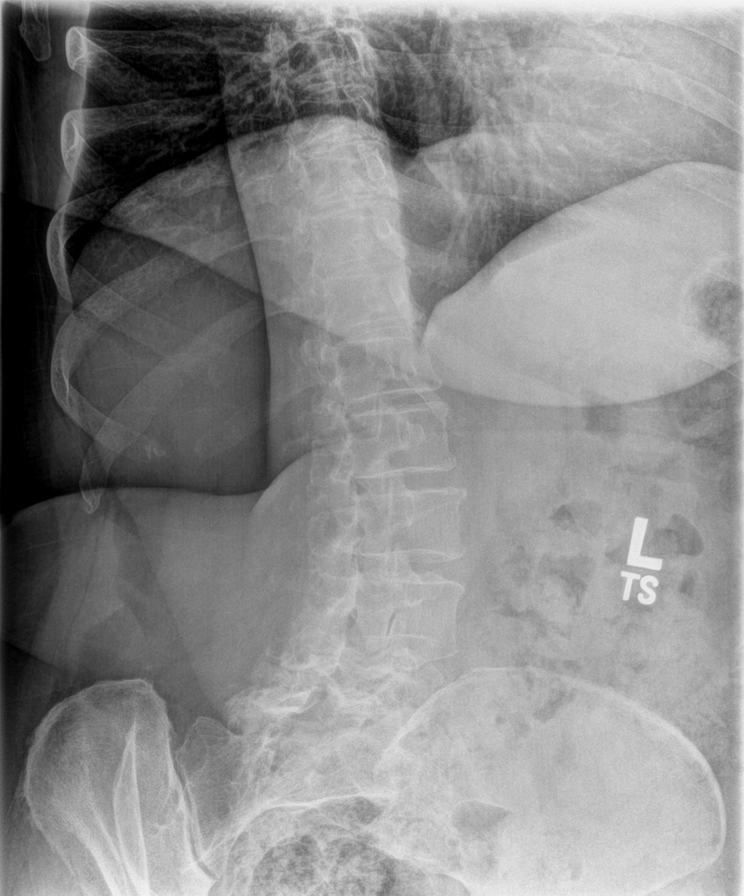
[im 6/7]
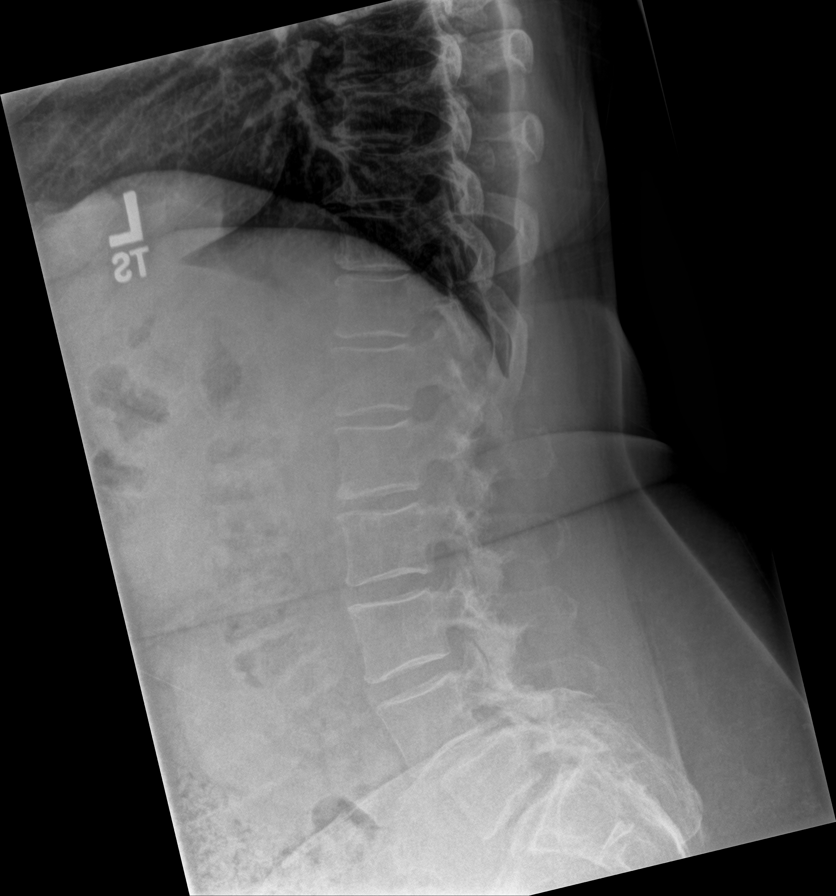
[im 7/7]
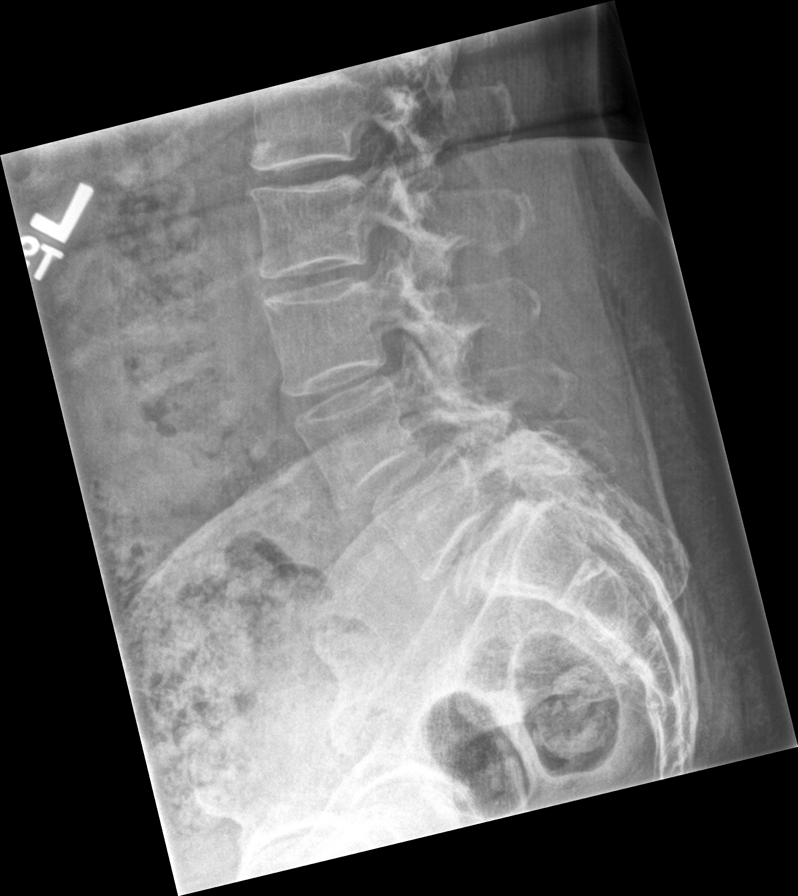

[7 of 7 positions shown; findings below may reference images not displayed]

FINDINGS: There is anterolisthesis of L5 on S1 by approximately 6 mm and
anterolisthesis of L4 on L5 by approximately 9 mm. These
malalignments appear to be due to significant degenerative change
involving the facet joints at these levels. There is degenerative
disc disease at both L4-5 and L5-S1 levels. No compression deformity
is seen. The SI joints appear corticated.
IMPRESSION: 1. Anterolisthesis of L5 on S1 and L4 on L5 as noted above most
likely degenerative in origin.
2. Degenerative disc disease at both L4-5 and L5-S1 levels.

## 2019-05-19 IMAGING — CR DG KNEE 1-2V*R*
1 series · 2 of 2 positions shown · non-contrast
Comparison: None.

CLINICAL DATA: Right knee pain, no acute trauma

EXAM:
RIGHT KNEE - 1-2 VIEW

[Series 1: dg knee 1-2 views right · 0.14mm/px · 2 of 2 slices shown]
[im 1/2]
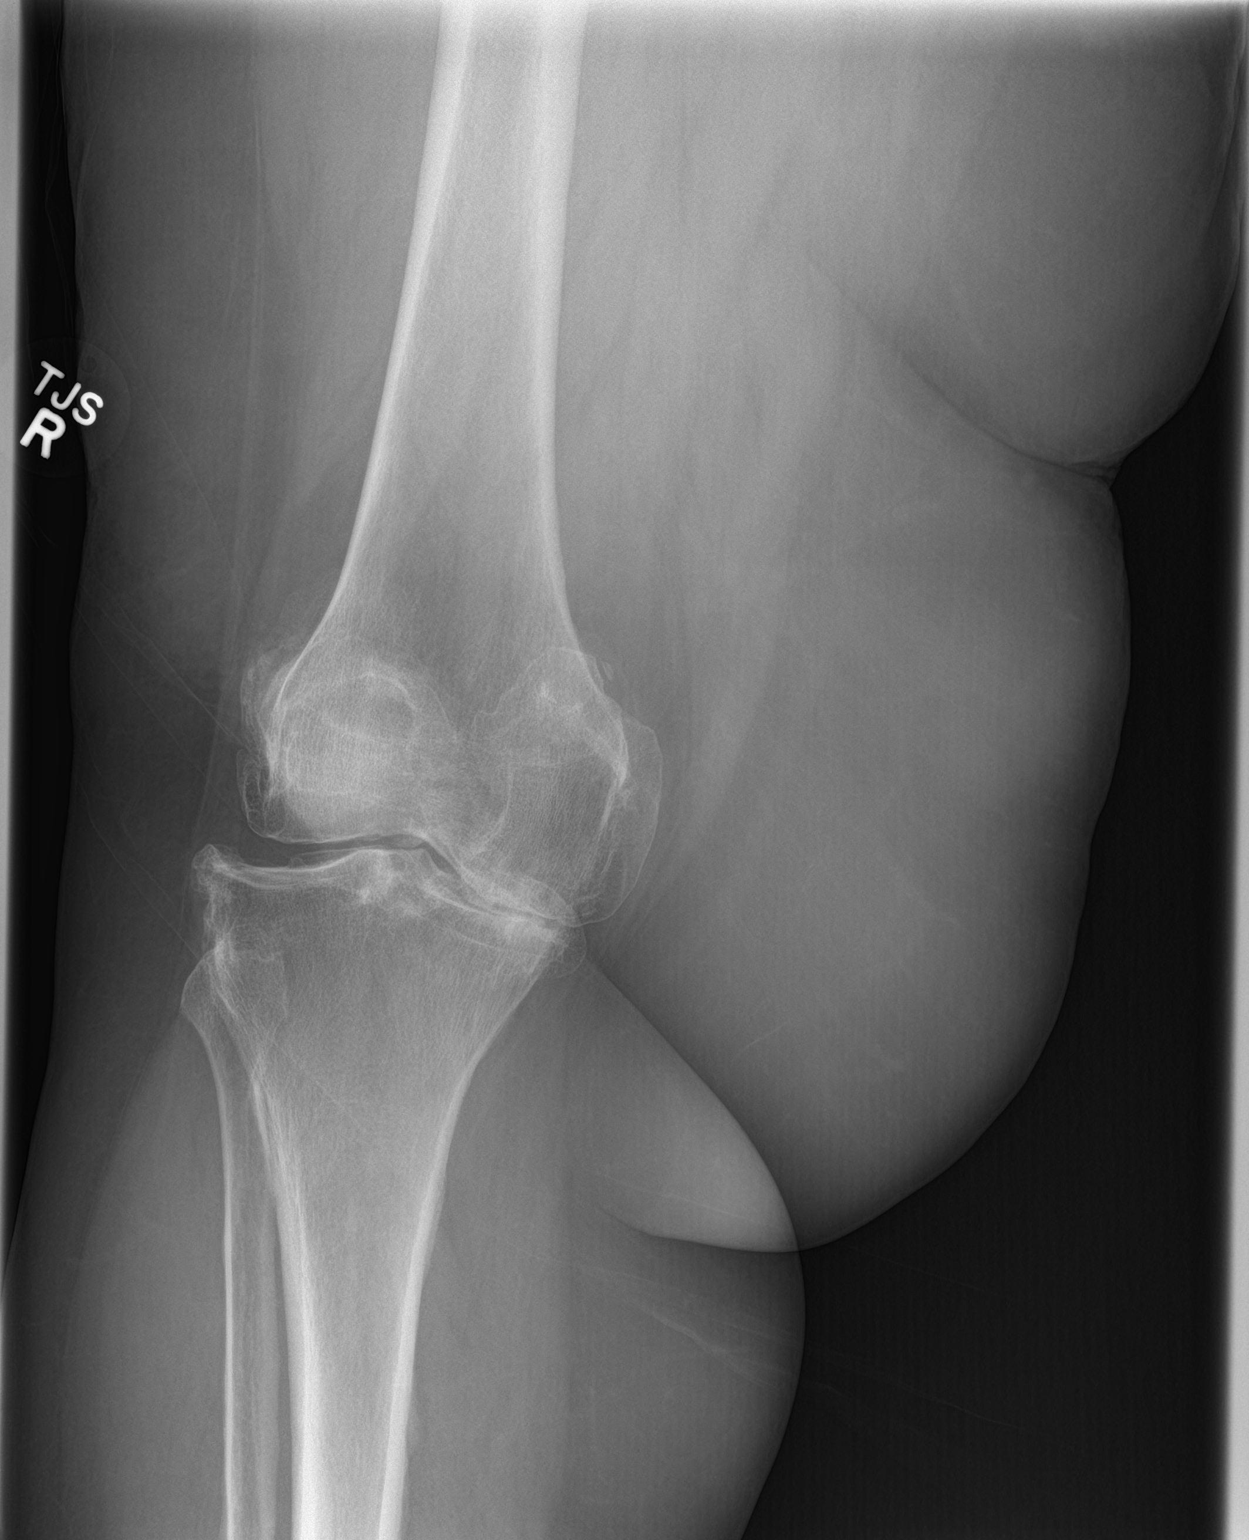
[im 2/2]
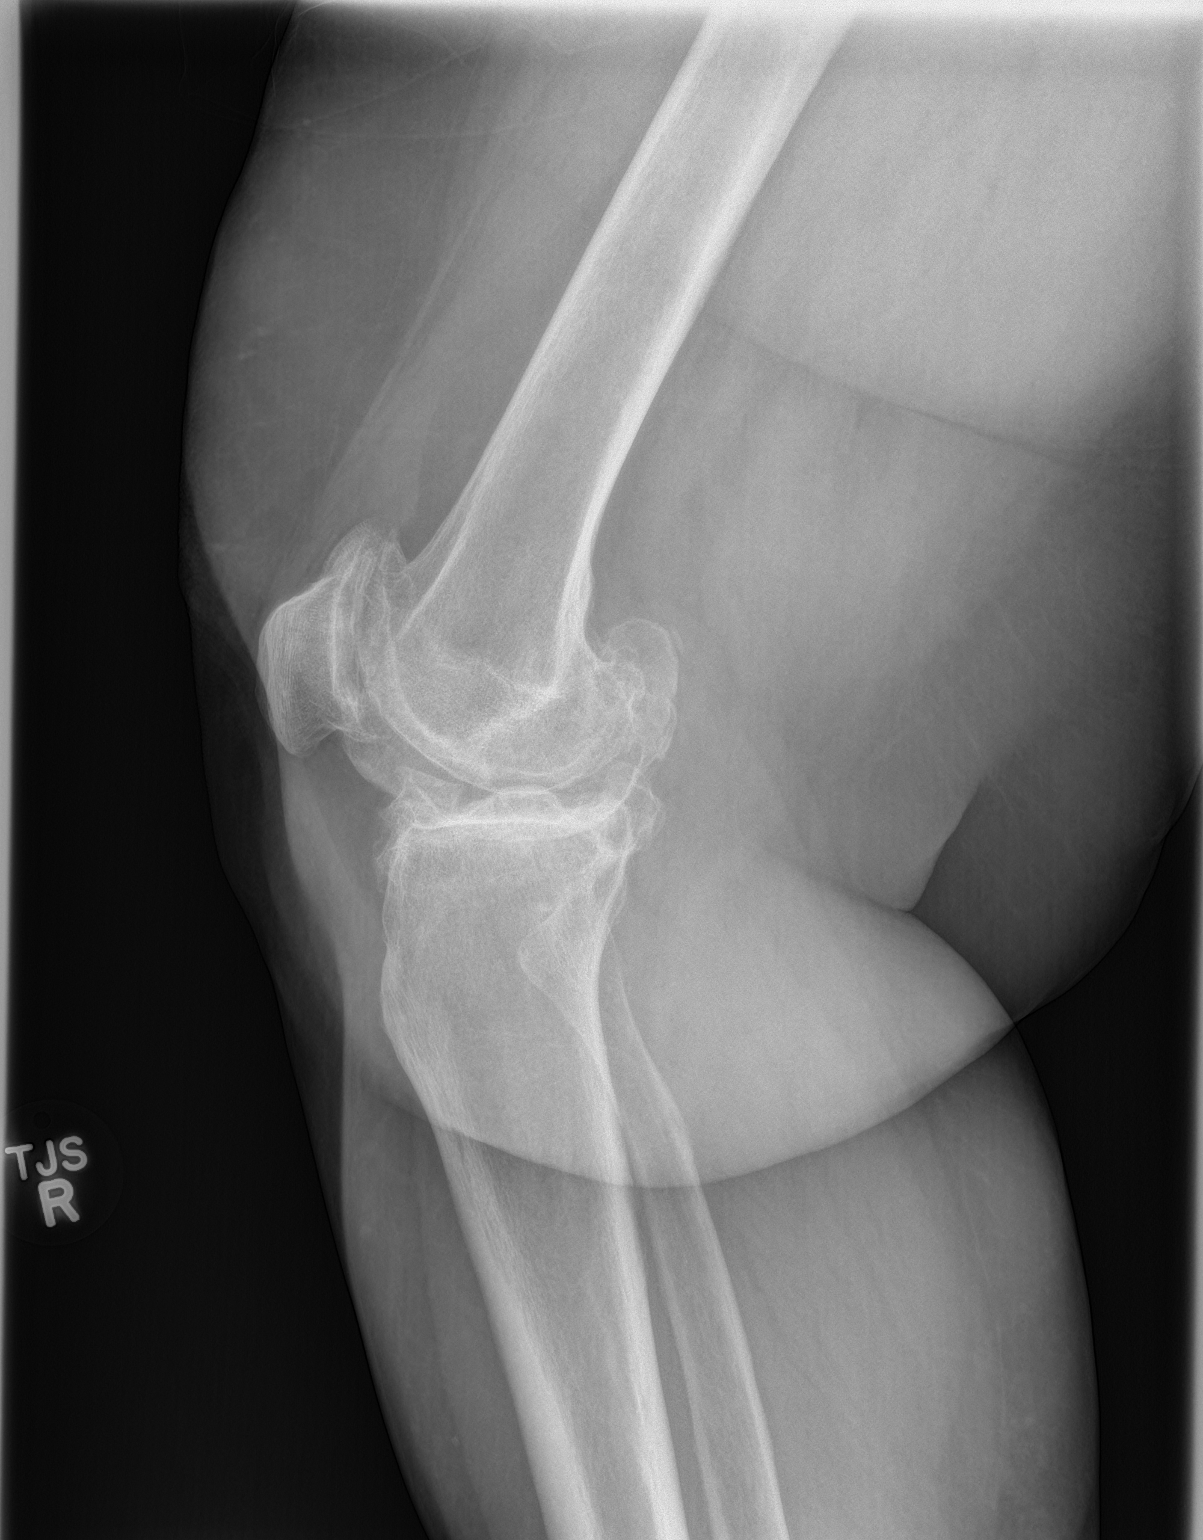

[2 of 2 positions shown; findings below may reference images not displayed]

FINDINGS: There is age advanced tricompartmental degenerative joint disease of
the right knee. There is complete loss of joint space involving the
medial compartment with considerable loss of joint space involving
the patellofemoral compartment. The lateral compartment is slightly
better preserved. There is degenerative spurring from each
compartment. No fracture is seen. There may be a tiny amount of
joint fluid present.
IMPRESSION: Age advanced tricompartmental degenerative joint disease of the
right knee. No acute abnormality.

## 2019-06-07 ENCOUNTER — Other Ambulatory Visit: Payer: Self-pay

## 2019-06-07 ENCOUNTER — Encounter: Payer: Self-pay | Admitting: Nurse Practitioner

## 2019-06-07 ENCOUNTER — Ambulatory Visit (INDEPENDENT_AMBULATORY_CARE_PROVIDER_SITE_OTHER): Payer: BLUE CROSS/BLUE SHIELD | Admitting: Nurse Practitioner

## 2019-06-07 VITALS — BP 138/72

## 2019-06-07 DIAGNOSIS — B373 Candidiasis of vulva and vagina: Secondary | ICD-10-CM

## 2019-06-07 DIAGNOSIS — B3731 Acute candidiasis of vulva and vagina: Secondary | ICD-10-CM

## 2019-06-07 MED ORDER — FLUCONAZOLE 150 MG PO TABS: 150.0000 mg | ORAL_TABLET | Freq: Once | ORAL | 1 refills | Status: AC

## 2019-06-07 NOTE — Progress Notes (Signed)
Telemedicine Encounter: Disclosed to patient at start of encounter that we will provide appropriate telemedicine services.  Patient consents to be treated via phone prior to discussion. - Patient is at her home and is accessed via telephone. - Services are provided by Cassell Smiles from [facility].  Subjective:    Patient ID: Stacy Martinez, female    DOB: Jan 21, 1956, 63 y.o.   MRN: [id number]  Stacy Martinez is a 63 y.o. female presenting on 06/07/2019 for Vaginitis (vaginal itchig, with a thick creamy discharge x 1 week )  HPI Vaginitis 1 week vaginal discharge with severe itching. Similar to past infections.  Diabetes and obesity has caused recurrent vaginal candidiasis in past. - Patient is not currently sexually active.   - Patient denies thin white discharge.  No odor.    - Patient has started Jardiance SGLT2 inhibitor in March, which is increasing her vaginal candidiasis risk.  Social History   Tobacco Use  . Smoking status: Never Smoker  . Smokeless tobacco: Never Used  Substance Use Topics  . Alcohol use: No    Alcohol/week: 0.0 standard drinks  . Drug use: No    Review of Systems Per HPI unless specifically indicated above     Objective:    BP 138/72   Wt Readings from Last 3 Encounters:  03/22/19 253 lb (114.8 kg)  03/21/19 240 lb (108.9 kg)  03/09/19 240 lb (108.9 kg)    Physical Exam Patient remotely monitored.  Verbal communication appropriate.  Cognition normal.   Results for orders placed or performed in visit on 03/22/19  COMPLETE METABOLIC PANEL WITH GFR  Result Value Ref Range   Glucose, Bld 193 (H) 65 - 139 mg/dL   BUN 27 (H) 7 - 25 mg/dL   Creat 0.73 0.50 - 0.99 mg/dL   GFR, Est Non African American 88 > OR = 60 mL/min/1.71m2   GFR, Est African American 102 > OR = 60 mL/min/1.33m2   BUN/Creatinine Ratio 37 (H) 6 - 22 (calc)   Sodium 136 135 - 146 mmol/L   Potassium 4.7 3.5 - 5.3 mmol/L   Chloride 102 98 - 110 mmol/L   CO2 20  20 - 32 mmol/L   Calcium 10.2 8.6 - 10.4 mg/dL   Total Protein 7.6 6.1 - 8.1 g/dL   Albumin 4.3 3.6 - 5.1 g/dL   Globulin 3.3 1.9 - 3.7 g/dL (calc)   AG Ratio 1.3 1.0 - 2.5 (calc)   Total Bilirubin 0.5 0.2 - 1.2 mg/dL   Alkaline phosphatase (APISO) 91 37 - 153 U/L   AST 15 10 - 35 U/L   ALT 20 6 - 29 U/L  Lipid panel  Result Value Ref Range   Cholesterol 218 (H) <200 mg/dL   HDL 56 > OR = 50 mg/dL   Triglycerides 125 <150 mg/dL   LDL Cholesterol (Calc) 137 (H) mg/dL (calc)   Total CHOL/HDL Ratio 3.9 <5.0 (calc)   Non-HDL Cholesterol (Calc) 162 (H) <130 mg/dL (calc)  HIV Antibody (routine testing w rflx)  Result Value Ref Range   HIV 1&2 Ab, 4th Generation NON-REACTIVE NON-REACTI  POCT glycosylated hemoglobin (Hb A1C)  Result Value Ref Range   Hemoglobin A1C 7.6 (A) 4.0 - 5.6 %   HbA1c POC (<> result, manual entry)     HbA1c, POC (prediabetic range)     HbA1c, POC (controlled diabetic range)        Assessment & Plan:   Problem List Items Addressed This  Visit    None    Visit Diagnoses    Vaginal candidiasis    -  Primary    Acute vaginal candidiasis noted based on review of symptoms and similar to past presentation.  Patient at higher risk due to taking SGLT2 inhibitor.    Plan: 1. START diflucan 150 mg once today.  Repeat in 3 days if symptoms persist.  Refill provided.  If needing to use again within next 30 days, patient instructed to call clinic to discuss Jardiance prescription.  May need to avoid SGLT2 inhibitors.  2. Encouraged pt to keep undergarments clean and dry. 3. Reassured pt is not sexually transmitted, but can have recurrent infection after treatment of BV or with different sexual partners and changes in vaginal pH.  Discouraged use of douching. 4. Followup as needed.   No orders of the defined types were placed in this encounter.   - Time spent in direct consultation with patient via telemedicine about above concerns: 5 minutes  Follow up plan: No  follow-ups on file.  Cassell Smiles, DNP, AGPCNP-BC Adult Gerontology Primary Care Nurse Practitioner [facility] 06/07/2019, 10:47 AM

## 2019-06-09 ENCOUNTER — Ambulatory Visit (INDEPENDENT_AMBULATORY_CARE_PROVIDER_SITE_OTHER): Payer: BLUE CROSS/BLUE SHIELD

## 2019-06-09 ENCOUNTER — Other Ambulatory Visit: Payer: Self-pay

## 2019-06-09 DIAGNOSIS — Z23 Encounter for immunization: Secondary | ICD-10-CM | POA: Diagnosis not present

## 2019-07-31 ENCOUNTER — Other Ambulatory Visit: Payer: Self-pay | Admitting: Nurse Practitioner

## 2019-07-31 DIAGNOSIS — I10 Essential (primary) hypertension: Secondary | ICD-10-CM

## 2019-08-09 ENCOUNTER — Other Ambulatory Visit: Payer: Self-pay

## 2019-08-09 DIAGNOSIS — Z20822 Contact with and (suspected) exposure to covid-19: Secondary | ICD-10-CM

## 2019-08-11 LAB — NOVEL CORONAVIRUS, NAA: SARS-CoV-2, NAA: DETECTED — AB

## 2019-08-12 ENCOUNTER — Telehealth: Payer: Self-pay | Admitting: Nurse Practitioner

## 2019-08-12 NOTE — Telephone Encounter (Signed)
Called to Discuss with patient about Covid symptoms and the use of bamlanivimab, a monoclonal antibody infusion for those with mild to moderate Covid symptoms and at a high risk of hospitalization.     Pt is qualified for this infusion at the [facility] infusion center due to co-morbid conditions and/or a member of an at-risk group.    Patient Active Problem List   Diagnosis Date Noted  . Primary osteoarthritis of right shoulder 04/28/2019  . Lumbar spondylosis 04/28/2019  . Disorder of right rotator cuff 11/10/2018  . Chronic right shoulder pain 11/10/2018  . Right shoulder tendonitis 11/10/2018  . Facet syndrome, lumbar 01/15/2016  . Diabetic neuropathy ([place]) 01/15/2016  . DJD (degenerative joint disease) of knee 01/15/2016  . Sacroiliac joint dysfunction 01/15/2016  . Hyperlipidemia associated with type 2 diabetes mellitus ([place]) 11/30/2015  . DDD (degenerative disc disease), lumbar 11/30/2015  . Cardiomyopathy ([place]) 11/30/2015  . Arthritis 11/30/2015  . BP (high blood pressure) 04/18/2015  . DM type 2 with diabetic peripheral neuropathy ([place]) 04/18/2015  . Wound of skin 04/18/2015  . Bowel obstruction ([place]) 12/19/2013      Unable to reach pt

## 2019-09-28 ENCOUNTER — Other Ambulatory Visit: Payer: Self-pay | Admitting: Nurse Practitioner

## 2019-09-28 DIAGNOSIS — E1142 Type 2 diabetes mellitus with diabetic polyneuropathy: Secondary | ICD-10-CM

## 2019-09-28 DIAGNOSIS — I10 Essential (primary) hypertension: Secondary | ICD-10-CM

## 2019-11-11 ENCOUNTER — Ambulatory Visit (INDEPENDENT_AMBULATORY_CARE_PROVIDER_SITE_OTHER): Payer: BLUE CROSS/BLUE SHIELD | Admitting: Family Medicine

## 2019-11-11 ENCOUNTER — Other Ambulatory Visit: Payer: Self-pay

## 2019-11-11 ENCOUNTER — Encounter: Payer: Self-pay | Admitting: Family Medicine

## 2019-11-11 VITALS — BP 127/52 | HR 79 | Temp 97.3°F | Ht 62.0 in | Wt 247.0 lb

## 2019-11-11 DIAGNOSIS — M67911 Unspecified disorder of synovium and tendon, right shoulder: Secondary | ICD-10-CM

## 2019-11-11 DIAGNOSIS — E0841 Diabetes mellitus due to underlying condition with diabetic mononeuropathy: Secondary | ICD-10-CM

## 2019-11-11 DIAGNOSIS — E785 Hyperlipidemia, unspecified: Secondary | ICD-10-CM

## 2019-11-11 DIAGNOSIS — E1142 Type 2 diabetes mellitus with diabetic polyneuropathy: Secondary | ICD-10-CM

## 2019-11-11 DIAGNOSIS — I10 Essential (primary) hypertension: Secondary | ICD-10-CM

## 2019-11-11 DIAGNOSIS — Z23 Encounter for immunization: Secondary | ICD-10-CM

## 2019-11-11 DIAGNOSIS — E1169 Type 2 diabetes mellitus with other specified complication: Secondary | ICD-10-CM

## 2019-11-11 LAB — POCT URINALYSIS DIPSTICK
Bilirubin, UA: NEGATIVE
Blood, UA: NEGATIVE
Glucose, UA: POSITIVE — AB
Ketones, UA: NEGATIVE
Leukocytes, UA: NEGATIVE
Nitrite, UA: NEGATIVE
Protein, UA: NEGATIVE
Spec Grav, UA: 1.02 (ref 1.010–1.025)
Urobilinogen, UA: 0.2 E.U./dL
pH, UA: 5 (ref 5.0–8.0)

## 2019-11-11 LAB — POCT UA - MICROALBUMIN: Microalbumin Ur, POC: NEGATIVE mg/L

## 2019-11-11 LAB — POCT GLYCOSYLATED HEMOGLOBIN (HGB A1C): Hemoglobin A1C: 9.1 % — AB (ref 4.0–5.6)

## 2019-11-11 MED ORDER — RELION BLOOD GLUCOSE TEST VI STRP: ORAL_STRIP | 12 refills | Status: AC

## 2019-11-11 MED ORDER — DICLOFENAC SODIUM 75 MG PO TBEC: 75.0000 mg | DELAYED_RELEASE_TABLET | Freq: Every day | ORAL | 1 refills | Status: DC

## 2019-11-11 MED ORDER — GABAPENTIN 100 MG PO CAPS: 100.0000 mg | ORAL_CAPSULE | Freq: Every day | ORAL | 0 refills | Status: DC

## 2019-11-11 MED ORDER — CARVEDILOL 25 MG PO TABS: ORAL_TABLET | ORAL | 0 refills | Status: DC

## 2019-11-11 MED ORDER — ROSUVASTATIN CALCIUM 20 MG PO TABS: 20.0000 mg | ORAL_TABLET | Freq: Every day | ORAL | 1 refills | Status: DC

## 2019-11-11 MED ORDER — RELION LANCETS ULTRA-THIN 30G MISC: 1.0000 | Freq: Every day | 11 refills | Status: AC

## 2019-11-11 MED ORDER — FUROSEMIDE 40 MG PO TABS: 80.0000 mg | ORAL_TABLET | Freq: Every day | ORAL | 0 refills | Status: DC

## 2019-11-11 MED ORDER — SITAGLIPTIN PHOSPHATE 100 MG PO TABS: ORAL_TABLET | ORAL | 0 refills | Status: DC

## 2019-11-11 MED ORDER — PEN NEEDLES 32G X 6 MM MISC: 1.0000 | Freq: Every day | 4 refills | Status: DC

## 2019-11-11 MED ORDER — JARDIANCE 25 MG PO TABS: 25.0000 mg | ORAL_TABLET | Freq: Every day | ORAL | 0 refills | Status: DC

## 2019-11-11 MED ORDER — BASAGLAR KWIKPEN 100 UNIT/ML ~~LOC~~ SOPN: 36.0000 [IU] | PEN_INJECTOR | Freq: Every day | SUBCUTANEOUS | 1 refills | Status: DC

## 2019-11-11 MED ORDER — ENALAPRIL MALEATE 20 MG PO TABS: 20.0000 mg | ORAL_TABLET | Freq: Two times a day (BID) | ORAL | 0 refills | Status: DC

## 2019-11-11 MED ORDER — AMLODIPINE BESYLATE 10 MG PO TABS: 10.0000 mg | ORAL_TABLET | Freq: Every day | ORAL | 0 refills | Status: DC

## 2019-11-11 NOTE — Assessment & Plan Note (Signed)
Takes Diclofenac for right rotator cuff disorder.  Has followed with Orthopedics and pending a follow up visit for updated treatment plan.

## 2019-11-11 NOTE — Assessment & Plan Note (Signed)
Managed with gabapentin 100mg  daily at bedtime.  Tolerating well without progression of symptoms.  Continue.

## 2019-11-11 NOTE — Progress Notes (Signed)
Subjective:    Patient ID: Stacy Martinez, female    DOB: Jan 21, 1956, 64 y.o.   MRN: [id number]  Stacy Martinez is a 64 y.o. female presenting on 11/11/2019 for Diabetes   HPI  Health Maintenance:  Pt presents today for follow up Type 2 Diabetes Mellitus.  He/she (caps): She is not checking blood glucose readings at home. -Current diabetic medications include: jardiance 25mg  tablet daily, januvia 100mg  daily, lantus 32 units daily -ACTION; IS/IS NOT: is not currently symptomatic -Actions; denies/reports/admits to: denies polydipsia, polyphagia, polyuria, headaches, diaphoresis, shakiness, chills, pain, numbness or tingling in extremities or changes in vision -Clinical course has been improving/declining -Reports no exercise routine -Diet is moderate in salt, moderate in fat, and moderate in carbohydrates -Weight trend is trend: decreasing steadily  PREVENTION Eye exam current (within 1 year) Not up to date - will be scheduling Foot exam current (within 1 year) Done today Lipid/ASCVD risk reduction - on statin: YES/NO: Yes  Kidney Protection (On ACE/ARB)? YES/NO: Yes    Reviewed outstanding CareGaps for Mammo, Pap and Colonoscopy.  Declined orders for now, requesting we complete all Summer 2021.  Will follow up.   Depression screen [facility] 2/9 11/11/2019 11/10/2018 09/29/2018  Decreased Interest 0 0 0  Down, Depressed, Hopeless 0 0 0  PHQ - 2 Score 0 0 0    Social History   Tobacco Use  . Smoking status: Never Smoker  . Smokeless tobacco: Never Used  Substance Use Topics  . Alcohol use: No    Alcohol/week: 0.0 standard drinks  . Drug use: No    Review of Systems Per HPI unless specifically indicated above     Objective:    BP (!) 127/52 (BP Location: Right Arm, Patient Position: Sitting, Cuff Size: Large)   Pulse 79   Temp (!) 97.3 F (36.3 C) (Temporal)   Ht 5\' 2"  (1.575 m)   Wt 247 lb (112 kg)   BMI 45.18 kg/m   Wt Readings from Last 3 Encounters:  11/11/19 247 lb  (112 kg)  03/22/19 253 lb (114.8 kg)  03/21/19 240 lb (108.9 kg)    Physical Exam Vitals reviewed.  Constitutional:      General: She is not in acute distress.    Appearance: Normal appearance. She is well-groomed. She is obese. She is not ill-appearing or toxic-appearing.  HENT:     Head: Normocephalic.     Right Ear: Tympanic membrane, ear canal and external ear normal. There is no impacted cerumen.     Left Ear: Tympanic membrane, ear canal and external ear normal. There is no impacted cerumen.     Nose: Nose normal. No congestion or rhinorrhea.     Mouth/Throat:     Lips: Pink.     Mouth: Mucous membranes are moist.     Pharynx: Oropharynx is clear. Uvula midline. No oropharyngeal exudate or posterior oropharyngeal erythema.  Eyes:     General: Lids are normal. Vision grossly intact. No scleral icterus.       Right eye: No discharge.        Left eye: No discharge.     Extraocular Movements: Extraocular movements intact.     Conjunctiva/sclera: Conjunctivae normal.     Pupils: Pupils are equal, round, and reactive to light.  Neck:     Thyroid: No thyroid mass or thyromegaly.  Cardiovascular:     Rate and Rhythm: Normal rate and regular rhythm.     Pulses: Normal pulses.  Dorsalis pedis pulses are 2+ on the right side and 2+ on the left side.       Posterior tibial pulses are 2+ on the right side and 2+ on the left side.     Heart sounds: Normal heart sounds. No murmur. No friction rub. No gallop.   Pulmonary:     Effort: Pulmonary effort is normal. No respiratory distress.     Breath sounds: Normal breath sounds.  Abdominal:     General: Abdomen is flat. Bowel sounds are normal. There is no distension.     Palpations: Abdomen is soft. There is no hepatomegaly, splenomegaly or mass.     Tenderness: There is no abdominal tenderness. There is no guarding or rebound.     Hernia: No hernia is present.  Musculoskeletal:        General: Normal range of motion.      Cervical back: Normal range of motion and neck supple. No tenderness.     Right lower leg: No edema.     Left lower leg: No edema.  Feet:     Right foot:     Skin integrity: Skin integrity normal.     Toenail Condition: Right toenails are normal.     Left foot:     Skin integrity: Skin integrity normal.     Toenail Condition: Left toenails are normal.     Comments: Normal monofilament exam. Absent of callus and all other lesions.  Some dry skin noted, but pt reports regular foot care and can see bottom of her feet. Lymphadenopathy:     Cervical: No cervical adenopathy.  Skin:    General: Skin is warm and dry.     Capillary Refill: Capillary refill takes less than 2 seconds.  Neurological:     General: No focal deficit present.     Mental Status: She is alert and oriented to person, place, and time.     Cranial Nerves: No cranial nerve deficit.     Sensory: No sensory deficit.     Motor: No weakness.     Coordination: Coordination normal.     Gait: Gait normal.     Deep Tendon Reflexes: Reflexes normal.  Psychiatric:        Attention and Perception: Attention and perception normal.        Mood and Affect: Mood and affect normal.        Speech: Speech normal.        Behavior: Behavior normal. Behavior is cooperative.        Thought Content: Thought content normal.        Cognition and Memory: Cognition and memory normal.        Judgment: Judgment normal.     Results for orders placed or performed in visit on 11/11/19  POCT glycosylated hemoglobin (Hb A1C)  Result Value Ref Range   Hemoglobin A1C 9.1 (A) 4.0 - 5.6 %   HbA1c POC (<> result, manual entry)     HbA1c, POC (prediabetic range)     HbA1c, POC (controlled diabetic range)    POCT Urinalysis Dipstick  Result Value Ref Range   Color, UA Yellow    Clarity, UA Clear    Glucose, UA Positive (A) Negative   Bilirubin, UA negative    Ketones, UA negative    Spec Grav, UA 1.020 1.010 - 1.025   Blood, UA negative    pH,  UA 5.0 5.0 - 8.0   Protein, UA Negative Negative   Urobilinogen, UA  0.2 0.2 or 1.0 E.U./dL   Nitrite, UA NEGATIVE    Leukocytes, UA Negative Negative   Appearance     Odor    POCT UA - Microalbumin  Result Value Ref Range   Microalbumin Ur, POC Negative mg/L   Creatinine, POC     Albumin/Creatinine Ratio, Urine, POC        Assessment & Plan:   Problem List Items Addressed This Visit      Cardiovascular and Mediastinum   BP (high blood pressure)   Relevant Medications   amLODipine (NORVASC) 10 MG tablet   carvedilol (COREG) 25 MG tablet   enalapril (VASOTEC) 20 MG tablet   furosemide (LASIX) 40 MG tablet   rosuvastatin (CRESTOR) 20 MG tablet   Other Relevant Orders   CBC with Differential   COMPLETE METABOLIC PANEL WITH GFR   Lipid Profile     Endocrine   DM type 2 with diabetic peripheral neuropathy (HCC) - Primary   Relevant Medications   Insulin Glargine (BASAGLAR KWIKPEN) 100 UNIT/ML   enalapril (VASOTEC) 20 MG tablet   gabapentin (NEURONTIN) 100 MG capsule   glucose blood (RELION GLUCOSE TEST STRIPS) test strip   Insulin Pen Needle (PEN NEEDLES) 32G X 6 MM MISC   empagliflozin (JARDIANCE) 25 MG TABS tablet   ReliOn Ultra Thin Lancets MISC   rosuvastatin (CRESTOR) 20 MG tablet   sitaGLIPtin (JANUVIA) 100 MG tablet   Other Relevant Orders   POCT glycosylated hemoglobin (Hb A1C) (Completed)   POCT Urinalysis Dipstick (Completed)   POCT UA - Microalbumin (Completed)   CBC with Differential   COMPLETE METABOLIC PANEL WITH GFR   Lipid Profile   Hyperlipidemia associated with type 2 diabetes mellitus (HCC)   Relevant Medications   Insulin Glargine (BASAGLAR KWIKPEN) 100 UNIT/ML   enalapril (VASOTEC) 20 MG tablet   empagliflozin (JARDIANCE) 25 MG TABS tablet   rosuvastatin (CRESTOR) 20 MG tablet   sitaGLIPtin (JANUVIA) 100 MG tablet   Other Relevant Orders   CBC with Differential   COMPLETE METABOLIC PANEL WITH GFR   Lipid Profile   Diabetic neuropathy (HCC)     Relevant Medications   Insulin Glargine (BASAGLAR KWIKPEN) 100 UNIT/ML   enalapril (VASOTEC) 20 MG tablet   empagliflozin (JARDIANCE) 25 MG TABS tablet   rosuvastatin (CRESTOR) 20 MG tablet   sitaGLIPtin (JANUVIA) 100 MG tablet     Musculoskeletal and Integument   Disorder of right rotator cuff   Relevant Medications   diclofenac (VOLTAREN) 75 MG EC tablet    Other Visit Diagnoses    Need for diphtheria-tetanus-pertussis (Tdap) vaccine       Relevant Orders   Tdap vaccine greater than or equal to 7yo IM      Meds ordered this encounter  Medications  . Insulin Glargine (BASAGLAR KWIKPEN) 100 UNIT/ML    Sig: Inject 0.36 mLs (36 Units total) into the skin daily.    Dispense:  11 pen    Refill:  1  . amLODipine (NORVASC) 10 MG tablet    Sig: Take 1 tablet (10 mg total) by mouth daily.    Dispense:  90 tablet    Refill:  0  . carvedilol (COREG) 25 MG tablet    Sig: TAKE 1 TABLET BY MOUTH TWICE DAILY WITH A MEAL    Dispense:  180 tablet    Refill:  0  . diclofenac (VOLTAREN) 75 MG EC tablet    Sig: Take 1 tablet (75 mg total) by mouth at bedtime.  Dispense:  90 tablet    Refill:  1  . enalapril (VASOTEC) 20 MG tablet    Sig: Take 1 tablet (20 mg total) by mouth 2 (two) times daily.    Dispense:  180 tablet    Refill:  0  . furosemide (LASIX) 40 MG tablet    Sig: Take 2 tablets (80 mg total) by mouth daily.    Dispense:  180 tablet    Refill:  0  . gabapentin (NEURONTIN) 100 MG capsule    Sig: Take 1 capsule (100 mg total) by mouth at bedtime.    Dispense:  90 capsule    Refill:  0  . glucose blood (RELION GLUCOSE TEST STRIPS) test strip    Sig: Use as instructed    Dispense:  100 each    Refill:  12  . Insulin Pen Needle (PEN NEEDLES) 32G X 6 MM MISC    Sig: 1 Device by Does not apply route daily.    Dispense:  100 each    Refill:  4  . empagliflozin (JARDIANCE) 25 MG TABS tablet    Sig: Take 25 mg by mouth daily.    Dispense:  90 tablet    Refill:  0  .  ReliOn Ultra Thin Lancets MISC    Sig: 1 each by Does not apply route daily.    Dispense:  100 each    Refill:  11  . rosuvastatin (CRESTOR) 20 MG tablet    Sig: Take 1 tablet (20 mg total) by mouth daily.    Dispense:  90 tablet    Refill:  1  . sitaGLIPtin (JANUVIA) 100 MG tablet    Sig: Take 1 tablet by mouth once daily    Dispense:  90 tablet    Refill:  0      Follow up plan: Return in about 3 months (around 02/11/2020) for A1C, DM, HTN, Hyperlipidemia F/U.   Harlin Rain, [place] [facility] Nurse Practitioner [facility] 11/11/2019, 8:28 AM

## 2019-11-11 NOTE — Assessment & Plan Note (Signed)
Controlled hypertension.  BP goal < 130/80.  Pt not working on lifestyle modifications.  Taking medications tolerating well without side effects.   Plan: 1. Continue taking amlodipine 10mg  daily, carvedilol 25mg  twice a day, enalapril 20mg  twice a day, and lasix 80mg  daily  2. Obtain labs in the next 1-2 weeks, ordered today  3. Encouraged heart healthy diet and increasing exercise to 30 minutes most days of the week, going no more than 2 days in a row without exercise. 4. Check BP 1-2 x per week at home, keep log, and bring to clinic at next appointment. 5. Follow up 3 months.

## 2019-11-11 NOTE — Assessment & Plan Note (Signed)
Unknown control, labs ordered today.  Currently taking rosuvastatin and tolerating it well without any known side effects.  Plan: 1) Labs ordered today 2) Continue rosuvastatin 20mg  daily  3) Heart healthy diet and to exercise every other day for 30 minutes per day, going no more than 2 days in a row without exercise. 4) We will see you back in 3 months

## 2019-11-11 NOTE — Patient Instructions (Signed)
Have your labs drawn in the next 1-2 weeks after fasting for 8 hours.  Can begin to take over the counter Mirilax 1-2 cap fulls mixed with a warm beverage daily for occasional constipation and to avoid drinking apple juice for constipation as it has increased your A1C largely since your last visit.  Reminded to have yearly dilated eye exams, daily feet check, and following a heart healthy diet (low fat, low salt, low carb and protein control) along with daily exercise.  Eat a plant based diet for optimal health.  Advised to avoid juices, sodas, rice, pasta, potatoes and bread and to get at least 30 minutes of exercise 5x a week.    Try to get exercise a minimum of 30 minutes per day at least 5 days per week as well as  adequate water intake all while measuring blood pressure a few times per week.  Keep a blood pressure log and bring back to clinic at your next visit.  If your readings are consistently over 140/90 to contact our office/send me a MyChart message and we will see you sooner.  Can try DASH and Mediterranean diet options, avoiding processed foods, lowering sodium intake, avoiding pork products, and eating a plant based diet for optimal health.  Having high HDL (good) cholesterol and low triglyceride levels can reduce your risk of heart attack and stroke.  The following steps can be taken to reduce triglyceride levels and increase HDL (good) cholesterol levels.  Avoid or limit alcohol Alcohol significantly raises triglyceride levels.  If your triglycerides are higher than 150, avoid or limit alcohol.  Limit fruit juice and dried fruits Even fresh fruit juice contains a large amount of fructose sugar and can increase triglyceride and blood sugar levels.  Fruit juice and dried fruit are also high in calories and can add unwanted pounds.  Replace fruit juice and dried fruits with fresh fruit that has more fiber and fewer calories.  Limit non-diet soft drinks and sports drinks Non-diet  soft drinks and sport drinks contain as many as 12 teaspoons of sugar per serving.  This sugar can increase triglyceride and blood sugar levels.  Non-diet soft drinks and sport drinks also contain calories that add unwanted pounds.  Replace non-diet soft drinks and sports drinks with water, unsweetened tea, diet colas or beverages sweetened with Nutra-Sweet.  Limit concentrated sweets Even fat free or low fat sweets can raise your triglyceride and/or blood sugar levels.  Concentrated sweets include sugar, honey, jelly, candy, cookies, cakes, pies, pastries, frozen desserts, puddings, and sugar sweetened cereals.  Replace concentrated sweets with high fiber foods such as fruit, low fat yogurt, sugar free gelatin and low fat puddings.  Limit refined carbohydrates Refined carbohydrates include white flour, white bread, white rice, and some pasta.  Limit portion sizes of these foods or replace them with whole wheat bread, lentils, whole grains, brown rice, and spinach or whole-wheat pastas.  Reduce your weight, if needed Even a 5-10 pound weight loss can cause your triglyceride level to decrease significantly.  You can lose 1-2 pounds per week by limiting your portion sizes and exercising 5-6 times per week.  Include monounsaturated fats in your diet.  Include Monounsaturated fats in your diet Moderate amounts of monounsaturated fat can raise your HDL (good) cholesterol.  Sources of monounsaturated fat include olive oil, canola oil, peanut oil, peanuts, peanut butter, cashews, olives, and avocados.  Choose peanut butter that does not have sugar in the ingredient list.  Since these foods  are high in calories, serving sizes should be moderate.  Include fish in your diet Fish is low in saturated fat and rich in Omega-3 fatty acids.  Good choices include mackerel, salmon, herring, bluefish, lake trout, tuna, and sardines canned in oil.  If you smoke, quit Smoking lowers HDL (good) cholesterol and raises  triglycerides.  When you quit smoking your HDL (good) cholesterol increases and triglycerides decrease.  Stay active Exercise raises your HDL (good) cholesterol.  Walk, ride a bike, or swim for at least 20 minutes, 3-5 times per week.  Ideally, you should try to exercise for 30-60 minutes most days of the week.  Check with your Healthcare Provider before beginning an exercise program.  We will plan to see you back in 3 months re-evaluation of your Diabetes, Hypertension and Hyperlipidemia.  You will receive a survey after today's visit either digitally by e-mail or paper by [employer]. Your experiences and feedback matter to [place].  Please respond so we know how we are doing as we provide care for you.  Call us with any questions/concerns/needs.  It is my goal to be available to you for your health concerns.  Thanks for choosing me to be a partner in your healthcare needs!  Harlin Rain, FNP-C Family Nurse Practitioner [facility] Phone: [phone]

## 2019-11-11 NOTE — Assessment & Plan Note (Signed)
UncontrolledDM with A1c 9.1 Worsening control from 03/2019 A1C 7.6 and goal A1c < 7.0%. - Complications - peripheral neuropathy and hyperglycemia.  Plan:  1. Change therapy: Will discontinue Lantus due to reported intermittent constipation and resume Basaglar 36 units daily.  Continue with jardiance 25mg  daily and januvia 100mg  daily 2. Encourage improved lifestyle: - low carb/low glycemic diet reinforced prior education - Increase physical activity to 30 minutes most days of the week.  Explained that increased physical activity increases body's use of sugar for energy. 3. Stop drinking apple juice for daily constipation and begin taking Mirilax over the counter 1-2 cap fulls daily to help with irregular bowel movements 3. Check fasting am CBG.  Bring log to next visit for review 4. Continue ACEi and Statin 5. Diabetic foot exam completed today, encouraged to schedule dilated eye exam with Ophthalmology 6. Follow-up in 3 months for re-evaluation

## 2019-11-14 ENCOUNTER — Other Ambulatory Visit: Payer: Self-pay | Admitting: Family Medicine

## 2019-11-14 DIAGNOSIS — I10 Essential (primary) hypertension: Secondary | ICD-10-CM

## 2019-11-15 ENCOUNTER — Other Ambulatory Visit: Payer: Self-pay | Admitting: Family Medicine

## 2019-11-15 DIAGNOSIS — E1142 Type 2 diabetes mellitus with diabetic polyneuropathy: Secondary | ICD-10-CM

## 2020-01-24 ENCOUNTER — Other Ambulatory Visit: Payer: Self-pay | Admitting: Family Medicine

## 2020-01-24 DIAGNOSIS — I10 Essential (primary) hypertension: Secondary | ICD-10-CM

## 2020-01-25 ENCOUNTER — Ambulatory Visit
Admission: EM | Admit: 2020-01-25 | Discharge: 2020-01-25 | Disposition: A | Discharge status: Home / Self Care | Payer: 59 | Attending: Family Medicine | Admitting: Family Medicine

## 2020-01-25 ENCOUNTER — Encounter: Payer: Self-pay | Admitting: Emergency Medicine

## 2020-01-25 ENCOUNTER — Other Ambulatory Visit: Payer: Self-pay

## 2020-01-25 DIAGNOSIS — N390 Urinary tract infection, site not specified: Secondary | ICD-10-CM

## 2020-01-25 DIAGNOSIS — R319 Hematuria, unspecified: Secondary | ICD-10-CM | POA: Insufficient documentation

## 2020-01-25 DIAGNOSIS — R110 Nausea: Secondary | ICD-10-CM | POA: Diagnosis present

## 2020-01-25 DIAGNOSIS — Z1152 Encounter for screening for COVID-19: Secondary | ICD-10-CM

## 2020-01-25 DIAGNOSIS — Z20822 Contact with and (suspected) exposure to covid-19: Secondary | ICD-10-CM | POA: Insufficient documentation

## 2020-01-25 LAB — URINALYSIS, COMPLETE (UACMP) WITH MICROSCOPIC
Bilirubin Urine: NEGATIVE
Glucose, UA: 500 mg/dL — AB
Ketones, ur: >160 mg/dL — AB
Nitrite: POSITIVE — AB
Protein, ur: 30 mg/dL — AB
Specific Gravity, Urine: 1.015 (ref 1.005–1.030)
WBC, UA: >50 WBC/hpf (ref 0–5)
pH: 5 (ref 5.0–8.0)

## 2020-01-25 MED ORDER — ONDANSETRON 4 MG PO TBDP: 4.0000 mg | ORAL_TABLET | Freq: Three times a day (TID) | ORAL | 0 refills | Status: DC | PRN

## 2020-01-25 MED ORDER — CEPHALEXIN 500 MG PO CAPS
500.0000 mg | ORAL_CAPSULE | Freq: Two times a day (BID) | ORAL | 0 refills | Status: AC
Start: 2020-01-25 — End: 2020-02-01

## 2020-01-25 NOTE — ED Provider Notes (Signed)
[facility] ____________________________________________  Time seen: Approximately 5:15 PM  I have reviewed the triage vital signs and the nursing notes.   HISTORY  Chief Complaint Chills, Headache, and Nausea   HPI Stacy Martinez is a 64 y.o. female presenting for evaluation of chills, nausea and headache intermittently since Sunday.  Reports subjective fever, not measured.  States symptoms have been coming and going.  States decreased appetite with nausea, no vomiting or diarrhea.  Denies known trigger.  Has taken some over-the-counter Tylenol.  Denies chest pain, shortness of breath, abdominal pain, dysuria, cough, congestion, sore throat, ear pain, rash, tick bite, neck pain.  Denies known sick contacts.  Patient reports in November 2020 she had COVID-19, and reports since then every so often she will have 1 day of similar symptoms, but states that usually resolves after 1 day.  Reports otherwise doing well.  Malfi, Lupita Raider, FNP : PCP   Past Medical History:  Diagnosis Date  . Allergy   . Arthritis   . Diabetes mellitus without complication ([place])   . Enlarged heart   . Hypertension     Patient Active Problem List   Diagnosis Date Noted  . Primary osteoarthritis of right shoulder 04/28/2019  . Lumbar spondylosis 04/28/2019  . Disorder of right rotator cuff 11/10/2018  . Chronic right shoulder pain 11/10/2018  . Right shoulder tendonitis 11/10/2018  . Facet syndrome, lumbar 01/15/2016  . Diabetic neuropathy ([place]) 01/15/2016  . DJD (degenerative joint disease) of knee 01/15/2016  . Sacroiliac joint dysfunction 01/15/2016  . Hyperlipidemia associated with type 2 diabetes mellitus ([place]) 11/30/2015  . DDD (degenerative disc disease), lumbar 11/30/2015  . Cardiomyopathy ([place]) 11/30/2015  . Arthritis 11/30/2015  . BP (high blood pressure) 04/18/2015  . DM type 2 with diabetic peripheral neuropathy ([place]) 04/18/2015  . Wound of skin 04/18/2015  . Bowel obstruction  ([place]) 12/19/2013    Past Surgical History:  Procedure Laterality Date  . COLON SURGERY    . HERNIA REPAIR    . WRIST FRACTURE SURGERY Right       Current Facility-Administered Medications:  .  bupivacaine (PF) (MARCAINE) 0.25 % injection 30 mL, 30 mL, Other, Once, Mohammed Kindle, MD .  lidocaine (PF) (XYLOCAINE) 1 % injection 10 mL, 10 mL, Subcutaneous, Once, Mohammed Kindle, MD .  midazolam (VERSED) 5 MG/5ML injection 5 mg, 5 mg, Intravenous, Once, Mohammed Kindle, MD .  orphenadrine (NORFLEX) injection 60 mg, 60 mg, Intramuscular, Once, Mohammed Kindle, MD .  sodium chloride flush (NS) 0.9 % injection 20 mL, 20 mL, Other, Once, Mohammed Kindle, MD .  triamcinolone acetonide (KENALOG-40) injection 40 mg, 40 mg, Other, Once, Mohammed Kindle, MD  Current Outpatient Medications:  .  amLODipine (NORVASC) 10 MG tablet, Take 1 tablet by mouth once daily, Disp: 90 tablet, Rfl: 0 .  carvedilol (COREG) 25 MG tablet, TAKE 1 TABLET BY MOUTH TWICE DAILY WITH A MEAL, Disp: 180 tablet, Rfl: 0 .  empagliflozin (JARDIANCE) 25 MG TABS tablet, Take 25 mg by mouth daily., Disp: 90 tablet, Rfl: 0 .  enalapril (VASOTEC) 20 MG tablet, Take 1 tablet (20 mg total) by mouth 2 (two) times daily., Disp: 180 tablet, Rfl: 0 .  furosemide (LASIX) 40 MG tablet, Take 2 tablets (80 mg total) by mouth daily., Disp: 180 tablet, Rfl: 0 .  gabapentin (NEURONTIN) 100 MG capsule, Take 1 capsule (100 mg total) by mouth at bedtime., Disp: 90 capsule, Rfl: 0 .  LANTUS SOLOSTAR 100 UNIT/ML Solostar Pen, INJECT 32  UNITS SUBCUTANEOUSLY ONCE DAILY, Disp: 30 mL, Rfl: 0 .  rosuvastatin (CRESTOR) 20 MG tablet, Take 1 tablet (20 mg total) by mouth daily., Disp: 90 tablet, Rfl: 1 .  sitaGLIPtin (JANUVIA) 100 MG tablet, Take 1 tablet by mouth once daily, Disp: 90 tablet, Rfl: 0 .  cephALEXin (KEFLEX) 500 MG capsule, Take 1 capsule (500 mg total) by mouth 2 (two) times daily for 7 days., Disp: 14 capsule, Rfl: 0 .  diclofenac (VOLTAREN) 75  MG EC tablet, Take 1 tablet (75 mg total) by mouth 2 (two) times daily as needed for moderate pain. (Patient not taking: Reported on 03/22/2019), Disp: 60 tablet, Rfl: 3 .  diclofenac (VOLTAREN) 75 MG EC tablet, Take 1 tablet (75 mg total) by mouth at bedtime., Disp: 90 tablet, Rfl: 1 .  glucose blood (RELION GLUCOSE TEST STRIPS) test strip, Use as instructed, Disp: 100 each, Rfl: 12 .  Insulin Pen Needle (PEN NEEDLES) 32G X 6 MM MISC, 1 Device by Does not apply route daily., Disp: 100 each, Rfl: 4 .  methocarbamol (ROBAXIN) 500 MG tablet, Take 1 tablet (500 mg total) by mouth every 8 (eight) hours as needed for muscle spasms., Disp: 90 tablet, Rfl: 2 .  ondansetron (ZOFRAN ODT) 4 MG disintegrating tablet, Take 1 tablet (4 mg total) by mouth every 8 (eight) hours as needed., Disp: 15 tablet, Rfl: 0 .  ReliOn Ultra Thin Lancets MISC, 1 each by Does not apply route daily., Disp: 100 each, Rfl: 11  Allergies Prednisone and Metformin and related  Family History  Problem Relation Age of Onset  . Diabetes Mother   . Cancer Father   . Mental illness Son     Social History Social History   Tobacco Use  . Smoking status: Never Smoker  . Smokeless tobacco: Never Used  Substance Use Topics  . Alcohol use: No    Alcohol/week: 0.0 standard drinks  . Drug use: No    Review of Systems Constitutional: No fever/chills Eyes: No visual changes. ENT: No sore throat. Cardiovascular: Denies chest pain. Respiratory: Denies shortness of breath. Gastrointestinal: No abdominal pain.  No nausea, no vomiting.  No diarrhea.  No constipation. Genitourinary: Negative for dysuria. Musculoskeletal: Negative for back pain. Skin: Negative for rash. Neurological: Negative for headaches, focal weakness or numbness.  10-point ROS otherwise negative.  ____________________________________________   PHYSICAL EXAM:  VITAL SIGNS: ED Triage Vitals  Enc Vitals Group     BP 01/25/20 1637 (!) 146/68     Pulse  Rate 01/25/20 1637 85     Resp 01/25/20 1637 18     Temp 01/25/20 1637 99.5 F (37.5 C)     Temp Source 01/25/20 1637 Oral     SpO2 01/25/20 1637 97 %     Weight 01/25/20 1635 240 lb (108.9 kg)     Height 01/25/20 1635 5\' 1"  (1.549 m)     Head Circumference --      Peak Flow --      Pain Score 01/25/20 1634 5     Pain Loc --      Pain Edu? --      Excl. in [place]? --     Constitutional: Alert and oriented. Well appearing and in no acute distress. Eyes: Conjunctivae are normal. PERRL. EOMI. ENT      Head: Normocephalic and atraumatic.      Nose: No congestion/rhinnorhea.      Mouth/Throat: Mucous membranes are moist.Oropharynx non-erythematous. Neck: No stridor. Supple without meningismus.  Hematological/Lymphatic/Immunilogical:  No cervical lymphadenopathy. Cardiovascular: Normal rate, regular rhythm. Grossly normal heart sounds.  Good peripheral circulation. Respiratory: Normal respiratory effort without tachypnea nor retractions. Breath sounds are clear and equal bilaterally. No wheezes, rales, rhonchi. Gastrointestinal: Soft and nontender. No distention. Normal Bowel sounds. No CVA tenderness. Musculoskeletal:  Nontender with normal range of motion in all extremities. No midline cervical, thoracic or lumbar tenderness to palpation. Bilateral pedal pulses equal and easily palpated.      Right lower leg:  No tenderness or edema.      Left lower leg:  No tenderness or edema.  Neurologic:  Normal speech and language. No gross focal neurologic deficits are appreciated. Speech is normal. No gait instability.  Skin:  Skin is warm, dry and intact. No rash noted. Psychiatric: Mood and affect are normal. Speech and behavior are normal. Patient exhibits appropriate insight and judgment   ___________________________________________   LABS (all labs ordered are listed, but only abnormal results are displayed)  Labs Reviewed  URINALYSIS, COMPLETE (UACMP) WITH MICROSCOPIC - Abnormal; Notable  for the following components:      Result Value   APPearance CLOUDY (*)    Glucose, UA 500 (*)    Hgb urine dipstick SMALL (*)    Ketones, ur >160 (*)    Protein, ur 30 (*)    Nitrite POSITIVE (*)    Leukocytes,Ua SMALL (*)    Bacteria, UA MANY (*)    All other components within normal limits  SARS CORONAVIRUS 2 (TAT 6-24 HRS)  URINE CULTURE     PROCEDURES Procedures   INITIAL IMPRESSION / ASSESSMENT AND PLAN / ED COURSE  Pertinent labs & imaging results that were available during my care of the patient were reviewed by me and considered in my medical decision making (see chart for details).  Well-appearing patient.  No acute distress.  Nausea, chills, intermittent headache.  COVID-19 test.  Urinalysis reviewed, UTI.  We will culture urine.  Will treat with Keflex and as needed Zofran.  Encourage rest, fluids, supportive care and monitoring.Discussed indication, risks and benefits of medications with patient.   Discussed follow up with Primary care physician this week. Discussed follow up and return parameters including no resolution or any worsening concerns. Patient verbalized understanding and agreed to plan.   ____________________________________________   FINAL CLINICAL IMPRESSION(S) / ED DIAGNOSES  Final diagnoses:  Urinary tract infection with hematuria, site unspecified  Nausea without vomiting  Encounter for screening laboratory testing for COVID-19 virus     ED Discharge Orders         Ordered    cephALEXin (KEFLEX) 500 MG capsule  2 times daily     01/25/20 1758    ondansetron (ZOFRAN ODT) 4 MG disintegrating tablet  Every 8 hours PRN     01/25/20 1758           Note: This dictation was prepared with Dragon dictation along with smaller phrase technology. Any transcriptional errors that result from this process are unintentional.         Marylene Land, NP 01/25/20 1808

## 2020-01-25 NOTE — Discharge Instructions (Addendum)
Take medication as prescribed. Rest. Drink plenty of fluids. Monitor.  ° °Follow up with your primary care physician this week as needed. Return to Urgent care for new or worsening concerns.  ° °

## 2020-01-25 NOTE — ED Triage Notes (Signed)
Patient c/o chills, headache, and nausea that started Sunday night. Patient had COVID November 2020. Denies cough.

## 2020-01-26 LAB — SARS CORONAVIRUS 2 (TAT 6-24 HRS): SARS Coronavirus 2: NEGATIVE

## 2020-01-28 LAB — URINE CULTURE: Culture: >=100000 — AB

## 2020-03-28 ENCOUNTER — Other Ambulatory Visit: Payer: Self-pay | Admitting: Family Medicine

## 2020-03-28 DIAGNOSIS — E1142 Type 2 diabetes mellitus with diabetic polyneuropathy: Secondary | ICD-10-CM

## 2020-04-05 ENCOUNTER — Encounter: Payer: Self-pay | Admitting: Student in an Organized Health Care Education/Training Program

## 2020-04-05 ENCOUNTER — Other Ambulatory Visit: Payer: Self-pay

## 2020-04-05 ENCOUNTER — Ambulatory Visit
Payer: 59 | Attending: Student in an Organized Health Care Education/Training Program | Admitting: Student in an Organized Health Care Education/Training Program

## 2020-04-05 VITALS — BP 142/65 | HR 90 | Temp 98.1°F | Resp 16 | Ht 62.0 in | Wt 240.0 lb

## 2020-04-05 DIAGNOSIS — G8929 Other chronic pain: Secondary | ICD-10-CM | POA: Diagnosis not present

## 2020-04-05 DIAGNOSIS — M19011 Primary osteoarthritis, right shoulder: Secondary | ICD-10-CM | POA: Insufficient documentation

## 2020-04-05 DIAGNOSIS — M47816 Spondylosis without myelopathy or radiculopathy, lumbar region: Secondary | ICD-10-CM | POA: Insufficient documentation

## 2020-04-05 DIAGNOSIS — M25511 Pain in right shoulder: Secondary | ICD-10-CM | POA: Insufficient documentation

## 2020-04-05 MED ORDER — DICLOFENAC SODIUM 75 MG PO TBEC: 75.0000 mg | DELAYED_RELEASE_TABLET | Freq: Every day | ORAL | 5 refills | Status: DC | PRN

## 2020-04-05 MED ORDER — TIZANIDINE HCL 2 MG PO TABS: 2.0000 mg | ORAL_TABLET | Freq: Three times a day (TID) | ORAL | 2 refills | Status: DC | PRN

## 2020-04-05 NOTE — Patient Instructions (Signed)
Tizanidine and Diclofenac have been escribed to your pharmacy.  GENERAL RISKS AND COMPLICATIONS  What are the risk, side effects and possible complications? Generally speaking, most procedures are safe.  However, with any procedure there are risks, side effects, and the possibility of complications.  The risks and complications are dependent upon the sites that are lesioned, or the type of nerve block to be performed.  The closer the procedure is to the spine, the more serious the risks are.  Great care is taken when placing the radio frequency needles, block needles or lesioning probes, but sometimes complications can occur. 1. Infection: Any time there is an injection through the skin, there is a risk of infection.  This is why sterile conditions are used for these blocks.  There are four possible types of infection. 1. Localized skin infection. 2. Central Nervous System Infection-This can be in the form of Meningitis, which can be deadly. 3. Epidural Infections-This can be in the form of an epidural abscess, which can cause pressure inside of the spine, causing compression of the spinal cord with subsequent paralysis. This would require an emergency surgery to decompress, and there are no guarantees that the patient would recover from the paralysis. 4. Discitis-This is an infection of the intervertebral discs.  It occurs in about 1% of discography procedures.  It is difficult to treat and it may lead to surgery.        2. Pain: the needles have to go through skin and soft tissues, will cause soreness.       3. Damage to internal structures:  The nerves to be lesioned may be near blood vessels or    other nerves which can be potentially damaged.       4. Bleeding: Bleeding is more common if the patient is taking blood thinners such as  aspirin, Coumadin, Ticiid, Plavix, etc., or if he/she have some genetic predisposition  such as hemophilia. Bleeding into the spinal canal can cause compression of  the spinal  cord with subsequent paralysis.  This would require an emergency surgery to  decompress and there are no guarantees that the patient would recover from the  paralysis.       5. Pneumothorax:  Puncturing of a lung is a possibility, every time a needle is introduced in  the area of the chest or upper back.  Pneumothorax refers to free air around the  collapsed lung(s), inside of the thoracic cavity (chest cavity).  Another two possible  complications related to a similar event would include: Hemothorax and Chylothorax.   These are variations of the Pneumothorax, where instead of air around the collapsed  lung(s), you may have blood or chyle, respectively.       6. Spinal headaches: They may occur with any procedures in the area of the spine.       7. Persistent CSF (Cerebro-Spinal Fluid) leakage: This is a rare problem, but may occur  with prolonged intrathecal or epidural catheters either due to the formation of a fistulous  track or a dural tear.       8. Nerve damage: By working so close to the spinal cord, there is always a possibility of  nerve damage, which could be as serious as a permanent spinal cord injury with  paralysis.       9. Death:  Although rare, severe deadly allergic reactions known as "Anaphylactic  reaction" can occur to any of the medications used.      10. Worsening  of the symptoms:  We can always make thing worse.  What are the chances of something like this happening? Chances of any of this occuring are extremely low.  By statistics, you have more of a chance of getting killed in a motor vehicle accident: while driving to the hospital than any of the above occurring .  Nevertheless, you should be aware that they are possibilities.  In general, it is similar to taking a shower.  Everybody knows that you can slip, hit your head and get killed.  Does that mean that you should not shower again?  Nevertheless always keep in mind that statistics do not mean anything if you  happen to be on the wrong side of them.  Even if a procedure has a 1 (one) in a 1,000,000 (million) chance of going wrong, it you happen to be that one..Also, keep in mind that by statistics, you have more of a chance of having something go wrong when taking medications.  Who should not have this procedure? If you are on a blood thinning medication (e.g. Coumadin, Plavix, see list of "Blood Thinners"), or if you have an active infection going on, you should not have the procedure.  If you are taking any blood thinners, please inform your physician.  How should I prepare for this procedure?  Do not eat or drink anything at least six hours prior to the procedure.  Bring a driver with you .  It cannot be a taxi.  Come accompanied by an adult that can drive you back, and that is strong enough to help you if your legs get weak or numb from the local anesthetic.  Take all of your medicines the morning of the procedure with just enough water to swallow them.  If you have diabetes, make sure that you are scheduled to have your procedure done first thing in the morning, whenever possible.  If you have diabetes, take only half of your insulin dose and notify our nurse that you have done so as soon as you arrive at the clinic.  If you are diabetic, but only take blood sugar pills (oral hypoglycemic), then do not take them on the morning of your procedure.  You may take them after you have had the procedure.  Do not take aspirin or any aspirin-containing medications, at least eleven (11) days prior to the procedure.  They may prolong bleeding.  Wear loose fitting clothing that may be easy to take off and that you would not mind if it got stained with Betadine or blood.  Do not wear any jewelry or perfume  Remove any nail coloring.  It will interfere with some of our monitoring equipment.  NOTE: Remember that this is not meant to be interpreted as a complete list of all possible complications.   Unforeseen problems may occur.  BLOOD THINNERS The following drugs contain aspirin or other products, which can cause increased bleeding during surgery and should not be taken for 2 weeks prior to and 1 week after surgery.  If you should need take something for relief of minor pain, you may take acetaminophen which is found in Tylenol,m Datril, Anacin-3 and Panadol. It is not blood thinner. The products listed below are.  Do not take any of the products listed below in addition to any listed on your instruction sheet.  A.P.C or A.P.C with Codeine Codeine Phosphate Capsules #3 Ibuprofen Ridaura  ABC compound Congesprin Imuran rimadil  Advil Cope Indocin Robaxisal  Alka-Seltzer Effervescent  Pain Reliever and Antacid Coricidin or Coricidin-D  Indomethacin Rufen  Alka-Seltzer plus Cold Medicine Cosprin Ketoprofen S-A-C Tablets  Anacin Analgesic Tablets or Capsules Coumadin Korlgesic Salflex  Anacin Extra Strength Analgesic tablets or capsules CP-2 Tablets Lanoril Salicylate  Anaprox Cuprimine Capsules Levenox Salocol  Anexsia-D Dalteparin Magan Salsalate  Anodynos Darvon compound Magnesium Salicylate Sine-off  Ansaid Dasin Capsules Magsal Sodium Salicylate  Anturane Depen Capsules Marnal Soma  APF Arthritis pain formula Dewitt's Pills Measurin Stanback  Argesic Dia-Gesic Meclofenamic Sulfinpyrazone  Arthritis Bayer Timed Release Aspirin Diclofenac Meclomen Sulindac  Arthritis pain formula Anacin Dicumarol Medipren Supac  Analgesic (Safety coated) Arthralgen Diffunasal Mefanamic Suprofen  Arthritis Strength Bufferin Dihydrocodeine Mepro Compound Suprol  Arthropan liquid Dopirydamole Methcarbomol with Aspirin Synalgos  ASA tablets/Enseals Disalcid Micrainin Tagament  Ascriptin Doan's Midol Talwin  Ascriptin A/D Dolene Mobidin Tanderil  Ascriptin Extra Strength Dolobid Moblgesic Ticlid  Ascriptin with Codeine Doloprin or Doloprin with Codeine Momentum Tolectin  Asperbuf Duoprin Mono-gesic  Trendar  Aspergum Duradyne Motrin or Motrin IB Triminicin  Aspirin plain, buffered or enteric coated Durasal Myochrisine Trigesic  Aspirin Suppositories Easprin Nalfon Trillsate  Aspirin with Codeine Ecotrin Regular or Extra Strength Naprosyn Uracel  Atromid-S Efficin Naproxen Ursinus  Auranofin Capsules Elmiron Neocylate Vanquish  Axotal Emagrin Norgesic Verin  Azathioprine Empirin or Empirin with Codeine Normiflo Vitamin E  Azolid Emprazil Nuprin Voltaren  Bayer Aspirin plain, buffered or children's or timed BC Tablets or powders Encaprin Orgaran Warfarin Sodium  Buff-a-Comp Enoxaparin Orudis Zorpin  Buff-a-Comp with Codeine Equegesic Os-Cal-Gesic   Buffaprin Excedrin plain, buffered or Extra Strength Oxalid   Bufferin Arthritis Strength Feldene Oxphenbutazone   Bufferin plain or Extra Strength Feldene Capsules Oxycodone with Aspirin   Bufferin with Codeine Fenoprofen Fenoprofen Pabalate or Pabalate-SF   Buffets II Flogesic Panagesic   Buffinol plain or Extra Strength Florinal or Florinal with Codeine Panwarfarin   Buf-Tabs Flurbiprofen Penicillamine   Butalbital Compound Four-way cold tablets Penicillin   Butazolidin Fragmin Pepto-Bismol   Carbenicillin Geminisyn Percodan   Carna Arthritis Reliever Geopen Persantine   Carprofen Gold's salt Persistin   Chloramphenicol Goody's Phenylbutazone   Chloromycetin Haltrain Piroxlcam   Clmetidine heparin Plaquenil   Cllnoril Hyco-pap Ponstel   Clofibrate Hydroxy chloroquine Propoxyphen         Before stopping any of these medications, be sure to consult the physician who ordered them.  Some, such as Coumadin (Warfarin) are ordered to prevent or treat serious conditions such as "deep thrombosis", "pumonary embolisms", and other heart problems.  The amount of time that you may need off of the medication may also vary with the medication and the reason for which you were taking it.  If you are taking any of these medications, please make sure  you notify your pain physician before you undergo any procedures.          Moderate Conscious Sedation, Adult Sedation is the use of medicines to promote relaxation and relieve discomfort and anxiety. Moderate conscious sedation is a type of sedation. Under moderate conscious sedation, you are less alert than normal, but you are still able to respond to instructions, touch, or both. Moderate conscious sedation is used during short medical and dental procedures. It is milder than deep sedation, which is a type of sedation under which you cannot be easily woken up. It is also milder than general anesthesia, which is the use of medicines to make you unconscious. Moderate conscious sedation allows you to return to your regular activities sooner. Tell a health care  provider about:  Any allergies you have.  All medicines you are taking, including vitamins, herbs, eye drops, creams, and over-the-counter medicines.  Use of steroids (by mouth or creams).  Any problems you or family members have had with sedatives and anesthetic medicines.  Any blood disorders you have.  Any surgeries you have had.  Any medical conditions you have, such as sleep apnea.  Whether you are pregnant or may be pregnant.  Any use of cigarettes, alcohol, marijuana, or street drugs. What are the risks? Generally, this is a safe procedure. However, problems may occur, including:  Getting too much medicine (oversedation).  Nausea.  Allergic reaction to medicines.  Trouble breathing. If this happens, a breathing tube may be used to help with breathing. It will be removed when you are awake and breathing on your own.  Heart trouble.  Lung trouble. What happens before the procedure? Staying hydrated Follow instructions from your health care provider about hydration, which may include:  Up to 2 hours before the procedure - you may continue to drink clear liquids, such as water, clear fruit juice, black  coffee, and plain tea. Eating and drinking restrictions Follow instructions from your health care provider about eating and drinking, which may include:  8 hours before the procedure - stop eating heavy meals or foods such as meat, fried foods, or fatty foods.  6 hours before the procedure - stop eating light meals or foods, such as toast or cereal.  6 hours before the procedure - stop drinking milk or drinks that contain milk.  2 hours before the procedure - stop drinking clear liquids. Medicine Ask your health care provider about:  Changing or stopping your regular medicines. This is especially important if you are taking diabetes medicines or blood thinners.  Taking medicines such as aspirin and ibuprofen. These medicines can thin your blood. Do not take these medicines before your procedure if your health care provider instructs you not to.  Tests and exams  You will have a physical exam.  You may have blood tests done to show: ? How well your kidneys and liver are working. ? How well your blood can clot. General instructions  Plan to have someone take you home from the hospital or clinic.  If you will be going home right after the procedure, plan to have someone with you for 24 hours. What happens during the procedure?  An IV tube will be inserted into one of your veins.  Medicine to help you relax (sedative) will be given through the IV tube.  The medical or dental procedure will be performed. What happens after the procedure?  Your blood pressure, heart rate, breathing rate, and blood oxygen level will be monitored often until the medicines you were given have worn off.  Do not drive for 24 hours. This information is not intended to replace advice given to you by your health care provider. Make sure you discuss any questions you have with your health care provider. Document Revised: 07/31/2017 Document Reviewed: 12/08/2015 Elsevier Patient Education  [place]. Facet Blocks Patient Information  Description: The facets are joints in the spine between the vertebrae.  Like any joints in the body, facets can become irritated and painful.  Arthritis can also effect the facets.  By injecting steroids and local anesthetic in and around these joints, we can temporarily block the nerve supply to them.  Steroids act directly on irritated nerves and tissues to reduce selling and inflammation which often  leads to decreased pain.  Facet blocks may be done anywhere along the spine from the neck to the low back depending upon the location of your pain.   After numbing the skin with local anesthetic (like Novocaine), a small needle is passed onto the facet joints under x-ray guidance.  You may experience a sensation of pressure while this is being done.  The entire block usually lasts about 15-25 minutes.   Conditions which may be treated by facet blocks:   Low back/buttock pain  Neck/shoulder pain  Certain types of headaches  Preparation for the injection:  2. Do not eat any solid food or dairy products within 8 hours of your appointment. 3. You may drink clear liquid up to 3 hours before appointment.  Clear liquids include water, black coffee, juice or soda.  No milk or cream please. 4. You may take your regular medication, including pain medications, with a sip of water before your appointment.  Diabetics should hold regular insulin (if taken separately) and take 1/2 normal NPH dose the morning of the procedure.  Carry some sugar containing items with you to your appointment. 5. A driver must accompany you and be prepared to drive you home after your procedure. 6. Bring all your current medications with you. 7. An IV may be inserted and sedation may be given at the discretion of the physician. 8. A blood pressure cuff, EKG and other monitors will often be applied during the procedure.  Some patients may need to have extra oxygen administered for a short  period. 9. You will be asked to provide medical information, including your allergies and medications, prior to the procedure.  We must know immediately if you are taking blood thinners (like Coumadin/Warfarin) or if you are allergic to IV iodine contrast (dye).  We must know if you could possible be pregnant.  Possible side-effects:   Bleeding from needle site  Infection (rare, may require surgery)  Nerve injury (rare)  Numbness & tingling (temporary)  Difficulty urinating (rare, temporary)  Spinal headache (a headache worse with upright posture)  Light-headedness (temporary)  Pain at injection site (serveral days)  Decreased blood pressure (rare, temporary)  Weakness in arm/leg (temporary)  Pressure sensation in back/neck (temporary)   Call if you experience:   Fever/chills associated with headache or increased back/neck pain  Headache worsened by an upright position  New onset, weakness or numbness of an extremity below the injection site  Hives or difficulty breathing (go to the emergency room)  Inflammation or drainage at the injection site(s)  Severe back/neck pain greater than usual  New symptoms which are concerning to you  Please note:  Although the local anesthetic injected can often make your back or neck feel good for several hours after the injection, the pain will likely return. It takes 3-7 days for steroids to work.  You may not notice any pain relief for at least one week.  If effective, we will often do a series of 2-3 injections spaced 3-6 weeks apart to maximally decrease your pain.  After the initial series, you may be a candidate for a more permanent nerve block of the facets.  If you have any questions, please call #[phone]) [facility]

## 2020-04-05 NOTE — Assessment & Plan Note (Signed)
Requested Prescriptions   Signed Prescriptions Disp Refills  . diclofenac (VOLTAREN) 75 MG EC tablet 30 tablet 5    Sig: Take 1 tablet (75 mg total) by mouth daily as needed for moderate pain.  Marland Kitchen tiZANidine (ZANAFLEX) 2 MG tablet 90 tablet 2    Sig: Take 1 tablet (2 mg total) by mouth every 8 (eight) hours as needed for muscle spasms (pt not certain of dosage).

## 2020-04-05 NOTE — Progress Notes (Signed)
Safety precautions to be maintained throughout the outpatient stay will include: orient to surroundings, keep bed in low position, maintain call bell within reach at all times, provide assistance with transfer out of bed and ambulation.  

## 2020-04-05 NOTE — Assessment & Plan Note (Signed)
Orders Placed This Encounter  Procedures  . LUMBAR FACET(MEDIAL BRANCH NERVE BLOCK) MBNB    Standing Status:   Future    Standing Expiration Date:   05/06/2020    Scheduling Instructions:     Procedure: Lumbar facet block (AKA.: Lumbosacral medial branch nerve block)     Side: Bilateral     Level: L3-4, L4-5,Facets ( L3, L4, L5, Medial Branch Nerves)     Sedation: with     Timeframe: ASAA    Order Specific Question:   Where will this procedure be performed?    Answer:   [facility]

## 2020-04-05 NOTE — Progress Notes (Signed)
PROVIDER NOTE: Information contained herein reflects review and annotations entered in association with encounter. Interpretation of such information and data should be left to medically-trained personnel. Information provided to patient can be located elsewhere in the medical record under "Patient Instructions". Document created using STT-dictation technology, any transcriptional errors that may result from process are unintentional.    Patient: Stacy Martinez  Service Category: E/M  Provider: Gillis Santa, MD  DOB: March 15, 1956  DOS: 04/05/2020  Specialty: Interventional Pain Management  MRN: [id number]  Setting: Ambulatory outpatient  PCP: Verl Bangs, FNP  Type: Established Patient    Referring Provider: Verl Bangs, FNP  Location: Office  Delivery: Face-to-face     HPI  Reason for encounter: Stacy Martinez, a 64 y.o. year old female, is here today for evaluation and management of her Facet syndrome, lumbar [M47.816]. Stacy Martinez's primary complain today is Back Pain (lower, left), Knee Pain (bilateral), and Shoulder Pain (right) Last encounter: Practice (Visit date not found). My last encounter with her was on Visit date not found. Pertinent problems: Stacy Martinez has Facet syndrome, lumbar; DJD (degenerative joint disease) of knee; Sacroiliac joint dysfunction; Disorder of right rotator cuff; Chronic right shoulder pain; Primary osteoarthritis of right shoulder; and Lumbar spondylosis on their pertinent problem list. Pain Assessment: Severity of Chronic pain is reported as a 10-Worst pain ever/10. Location: Back Left, Lower/denies. Onset: More than a month ago. Quality: Aching, Sharp, Shooting. Timing: Constant. Modifying factor(s): sitting. Vitals:  height is '5\' 2"'$  (1.575 m) and weight is 240 lb (108.9 kg). Her temporal temperature is 98.1 F (36.7 C). Her blood pressure is 142/65 (abnormal) and her pulse is 90. Her respiration is 16 and oxygen saturation is 99%.    Patient presents today with  severe pain of multiple locations.  She describes right shoulder pain that is excruciating.  She states that she has trouble with overhead reach and trouble combing her hair.  She has failed to glenohumeral steroid joint injections.  We discussed Sprint peripheral nerve stimulation of right axillary nerve.  Patient also endorses persistent and worsening low back pain.  She states that she finds it very difficult to walk even a short distance given her severe back pain.  She has been greater than 1 year without having her lumbar facet medial branch nerve blocks which have been effective for her albeit for 2 to 3 months.  Unfortunately, her insurance would not cover lumbar radiofrequency ablation so we had to resort to doing palliative lumbar facet medial branch nerve blocks (greater than 75% pain relief for 8 to 10 weeks) every 2 to 3 months.  We discussed repeating lumbar facet medial branch nerve blocks given that they are helpful for her and we can consider sprint peripheral nerve stimulation of lumbar medial branch nerve given her persistent low back pain related to lumbar facet arthropathy, lumbar degenerative joint disease, lumbar spondylosis.  We will refill diclofenac and tizanidine as below.  ROS  Constitutional: Denies any fever or chills Gastrointestinal: No reported hemesis, hematochezia, vomiting, or acute GI distress Musculoskeletal: Positive low back pain Neurological: No reported episodes of acute onset apraxia, aphasia, dysarthria, agnosia, amnesia, paralysis, loss of coordination, or loss of consciousness  Medication Review  Pen Needles, ReliOn Ultra Thin Lancets, amLODipine, carvedilol, diclofenac, empagliflozin, enalapril, furosemide, gabapentin, glucose blood, insulin glargine, ondansetron, rosuvastatin, sitaGLIPtin, and tiZANidine  History Review  Allergy: Stacy Martinez is allergic to prednisone and metformin and related. Drug: Stacy Martinez  reports no history of  drug use. Alcohol:   reports no history of alcohol use. Tobacco:  reports that she has never smoked. She has never used smokeless tobacco. Social: Stacy Martinez  reports that she has never smoked. She has never used smokeless tobacco. She reports that she does not drink alcohol and does not use drugs. Medical:  has a past medical history of Allergy, Arthritis, Diabetes mellitus without complication ([place]), Enlarged heart, and Hypertension. Surgical: Stacy Martinez  has a past surgical history that includes Colon surgery; Hernia repair; and Wrist fracture surgery (Right). Family: family history includes Cancer in her father; Diabetes in her mother; Mental illness in her son.  Laboratory Chemistry Profile   Renal Lab Results  Component Value Date   BUN 27 (H) 03/22/2019   CREATININE 0.73 03/22/2019   BCR 37 (H) 03/22/2019   GFRAA 102 03/22/2019   GFRNONAA 88 03/22/2019     Hepatic Lab Results  Component Value Date   AST 15 03/22/2019   ALT 20 03/22/2019   ALBUMIN 4.1 04/08/2017   ALKPHOS 119 04/08/2017   LIPASE 19 12/18/2013     Electrolytes Lab Results  Component Value Date   NA 136 03/22/2019   K 4.7 03/22/2019   CL 102 03/22/2019   CALCIUM 10.2 03/22/2019     Bone No results found for: VD25OH, VD125OH2TOT, TI1443XV4, MG8676PP5, 25OHVITD1, 25OHVITD2, 25OHVITD3, TESTOFREE, TESTOSTERONE   Inflammation (CRP: Acute Phase) (ESR: Chronic Phase) Lab Results  Component Value Date   LATICACIDVEN 1.72 01/02/2014       Note: Above Lab results reviewed.    Physical Exam  General appearance: Well nourished, well developed, and well hydrated. In no apparent acute distress Mental status: Alert, oriented x 3 (person, place, & time)       Respiratory: No evidence of acute respiratory distress Eyes: PERLA Vitals: BP (!) 142/65 (BP Location: Right Arm, Patient Position: Sitting, Cuff Size: Large)   Pulse 90   Temp 98.1 F (36.7 C) (Temporal)   Resp 16   Ht '5\' 2"'$  (1.575 m)   Wt 240 lb (108.9 kg)   SpO2 99%    BMI 43.90 kg/m  BMI: Estimated body mass index is 43.9 kg/m as calculated from the following:   Height as of this encounter: '5\' 2"'$  (1.575 m).   Weight as of this encounter: 240 lb (108.9 kg). Ideal: Ideal body weight: 50.1 kg (110 lb 7.2 oz) Adjusted ideal body weight: 73.6 kg (162 lb 4.3 oz)  Upper Extremity (UE) Exam    Side: Right upper extremity  Side: Left upper extremity  Skin & Extremity Inspection: Skin color, temperature, and hair growth are WNL. No peripheral edema or cyanosis. No masses, redness, swelling, asymmetry, or associated skin lesions. No contractures.  Skin & Extremity Inspection: Skin color, temperature, and hair growth are WNL. No peripheral edema or cyanosis. No masses, redness, swelling, asymmetry, or associated skin lesions. No contractures.  Functional ROM: Decreased ROM for shoulder  Functional ROM: Unrestricted ROM          Muscle Tone/Strength: Functionally intact. No obvious neuro-muscular anomalies detected.  Muscle Tone/Strength: Functionally intact. No obvious neuro-muscular anomalies detected.  Sensory (Neurological): Arthropathic arthralgia          Sensory (Neurological): Unimpaired          Palpation: No palpable anomalies              Palpation: No palpable anomalies              Provocative Test(s):  Phalen's  test: deferred Tinel's test: deferred Apley's scratch test (touch opposite shoulder):  Action 1 (Across chest): Decreased ROM Action 2 (Overhead): Decreased ROM Action 3 (LB reach): Decreased ROM   Provocative Test(s):  Phalen's test: deferred Tinel's test: deferred Apley's scratch test (touch opposite shoulder):  Action 1 (Across chest): deferred Action 2 (Overhead): deferred Action 3 (LB reach): deferred    Lumbar Spine Area Exam  Skin & Axial Inspection: No masses, redness, or swelling Alignment: Symmetrical Functional ROM: Pain restricted ROM affecting both sides Stability: No instability detected Muscle Tone/Strength:  Functionally intact. No obvious neuro-muscular anomalies detected. Sensory (Neurological): Musculoskeletal pain pattern Palpation: No palpable anomalies       Provocative Tests: Hyperextension/rotation test: (+) bilaterally for facet joint pain. Lumbar quadrant test (Kemp's test): (+) bilaterally for facet joint pain. Lateral bending test: deferred today       Patrick's Maneuver: deferred today                   FABER* test: deferred today                   S-I anterior distraction/compression test: deferred today         S-I lateral compression test: deferred today         S-I Thigh-thrust test: deferred today         S-I Gaenslen's test: deferred today         *(Flexion, ABduction and External Rotation) Gait & Posture Assessment  Ambulation: Limited Gait: Relatively normal for age and body habitus Posture: Difficulty standing up straight, due to pain  Lower Extremity Exam    Side: Right lower extremity  Side: Left lower extremity  Stability: No instability observed          Stability: No instability observed          Skin & Extremity Inspection: Skin color, temperature, and hair growth are WNL. No peripheral edema or cyanosis. No masses, redness, swelling, asymmetry, or associated skin lesions. No contractures.  Skin & Extremity Inspection: Skin color, temperature, and hair growth are WNL. No peripheral edema or cyanosis. No masses, redness, swelling, asymmetry, or associated skin lesions. No contractures.  Functional ROM: Pain restricted ROM for hip and knee joints          Functional ROM: Pain restricted ROM for hip and knee joints          Muscle Tone/Strength: Functionally intact. No obvious neuro-muscular anomalies detected.  Muscle Tone/Strength: Functionally intact. No obvious neuro-muscular anomalies detected.  Sensory (Neurological): Unimpaired        Sensory (Neurological): Unimpaired        DTR: Patellar: deferred today Achilles: deferred today Plantar: deferred today   DTR: Patellar: deferred today Achilles: deferred today Plantar: deferred today  Palpation: No palpable anomalies  Palpation: No palpable anomalies    Assessment   Status Diagnosis  Worsening Worsening Worsening 1. Facet syndrome, lumbar   2. Lumbar spondylosis   3. Spondylosis without myelopathy or radiculopathy, lumbar region   4. Primary osteoarthritis of right shoulder   5. Chronic right shoulder pain      Updated Problems: Problem  Primary Osteoarthritis of Right Shoulder   Right x-ray with mild to moderate degenerative changes of the before meals and glenohumeral joints.  Patient is status post 2 glenohumeral joint steroid injections for her right shoulder with a second 1 not providing as much pain relief.  Given worsening pain and limitations in shoulder abduction, recommend referral to orthopedics.  Consider SPRINT PNS Axillary NERVE STIM   Lumbar Spondylosis   Status post palliative lumbar facet medial branch nerve blocks, previously done July 2020.  Effective for her low back pain.  Consider sprint peripheral nerve stimulation of lumbar medial branch, lumbar RFA.  Previously, lumbar RFA was unable to be approved by insurance but patient has transition insurance company so could consider reordering.     Disorder of Right Rotator Cuff  Chronic Right Shoulder Pain  Facet Syndrome, Lumbar  Djd (Degenerative Joint Disease) of Knee  Sacroiliac Joint Dysfunction    Plan of Care  Stacy Martinez has a current medication list which includes the following long-term medication(s): amlodipine, carvedilol, enalapril, furosemide, gabapentin, lantus solostar, rosuvastatin, and sitagliptin.  Pharmacotherapy (Medications Ordered): Meds ordered this encounter  Medications  . diclofenac (VOLTAREN) 75 MG EC tablet    Sig: Take 1 tablet (75 mg total) by mouth daily as needed for moderate pain.    Dispense:  30 tablet    Refill:  5  . tiZANidine (ZANAFLEX) 2 MG tablet    Sig: Take  1 tablet (2 mg total) by mouth every 8 (eight) hours as needed for muscle spasms (pt not certain of dosage).    Dispense:  90 tablet    Refill:  2   Orders:  Orders Placed This Encounter  Procedures  . LUMBAR FACET(MEDIAL BRANCH NERVE BLOCK) MBNB    Standing Status:   Future    Standing Expiration Date:   05/06/2020    Scheduling Instructions:     Procedure: Lumbar facet block (AKA.: Lumbosacral medial branch nerve block)     Side: Bilateral     Level: L3-4, L4-5,Facets ( L3, L4, L5, Medial Branch Nerves)     Sedation: with     Timeframe: ASAA    Order Specific Question:   Where will this procedure be performed?    Answer:   [facility]   Follow-up plan:   Return in about 1 week (around 04/12/2020) for L3-L5 Fcts , with sedation.     Status post L2, L3, L4, L5 facet medial branch nerve block on 03/21/2019: Helpful.  Status post right shoulder steroid injection #2 on 03/09/2019 which was not helpful, having worsening shoulder pain.  Consider sprint axillary nerve peripheral stimulation, consider lumbar medial branch peripheral nerve stimulation    Recent Visits No visits were found meeting these conditions. Showing recent visits within past 90 days and meeting all other requirements Today's Visits Date Type Provider Dept  04/05/20 Office Visit Gillis Santa, MD [facility]  Showing today's visits and meeting all other requirements Future Appointments No visits were found meeting these conditions. Showing future appointments within next 90 days and meeting all other requirements  I discussed the assessment and treatment plan with the patient. The patient was provided an opportunity to ask questions and all were answered. The patient agreed with the plan and demonstrated an understanding of the instructions.  Patient advised to call back or seek an in-person evaluation if the symptoms or condition worsens.  Duration of encounter30 minutes.  Note by: Gillis Santa,  MD Date: 04/05/2020; Time: 8:43 AM

## 2020-04-23 ENCOUNTER — Encounter: Payer: Self-pay | Admitting: Student in an Organized Health Care Education/Training Program

## 2020-04-23 ENCOUNTER — Ambulatory Visit (HOSPITAL_BASED_OUTPATIENT_CLINIC_OR_DEPARTMENT_OTHER): Payer: 59 | Admitting: Student in an Organized Health Care Education/Training Program

## 2020-04-23 ENCOUNTER — Ambulatory Visit
Admission: RE | Admit: 2020-04-23 | Discharge: 2020-04-23 | Disposition: A | Discharge status: Home / Self Care | Payer: 59 | Source: Ambulatory Visit | Attending: Student in an Organized Health Care Education/Training Program | Admitting: Student in an Organized Health Care Education/Training Program

## 2020-04-23 ENCOUNTER — Other Ambulatory Visit: Payer: Self-pay

## 2020-04-23 DIAGNOSIS — M47816 Spondylosis without myelopathy or radiculopathy, lumbar region: Secondary | ICD-10-CM

## 2020-04-23 MED ORDER — ROPIVACAINE HCL 2 MG/ML IJ SOLN
9.0000 mL | Freq: Once | INTRAMUSCULAR | Status: AC
  Administered 2020-04-23: 10 mL via PERINEURAL
  Filled 2020-04-23: qty 10

## 2020-04-23 MED ORDER — DEXAMETHASONE SODIUM PHOSPHATE 10 MG/ML IJ SOLN
10.0000 mg | Freq: Once | INTRAMUSCULAR | Status: AC
  Administered 2020-04-23: 10 mg
  Filled 2020-04-23: qty 1

## 2020-04-23 MED ORDER — FENTANYL CITRATE (PF) 100 MCG/2ML IJ SOLN
25.0000 ug | INTRAMUSCULAR | Status: DC | PRN
  Administered 2020-04-23: 50 ug via INTRAVENOUS
  Filled 2020-04-23: qty 2

## 2020-04-23 MED ORDER — LIDOCAINE HCL 2 % IJ SOLN
20.0000 mL | Freq: Once | INTRAMUSCULAR | Status: AC
  Administered 2020-04-23: 200 mg
  Filled 2020-04-23: qty 10

## 2020-04-23 NOTE — Patient Instructions (Addendum)

## 2020-04-23 NOTE — Progress Notes (Signed)
Safety precautions to be maintained throughout the outpatient stay will include: orient to surroundings, keep bed in low position, maintain call bell within reach at all times, provide assistance with transfer out of bed and ambulation.  

## 2020-04-23 NOTE — Progress Notes (Signed)
Patient's Name: Stacy Martinez  MRN: [id number]  Referring Provider: Gillis Santa, MD  DOB: 05-Mar-1956  PCP: Verl Bangs, FNP  DOS: 04/23/2020  Note by: Gillis Santa, MD  Service setting: Ambulatory outpatient  Specialty: Interventional Pain Management  Patient type: Established  Location: [facility] (AMB) Pain Management Facility  Visit type: Interventional Procedure   Primary Reason for Visit: Interventional Pain Management Treatment. CC: Back Pain (lower)  Procedure:       Anesthesia, Analgesia, Anxiolysis:  Type: Lumbar Facet, Medial Branch Block(s) #1 in 2021 (had 3 done in 2020) Primary Purpose: Therapeutic Region: Posterolateral Lumbosacral Spine Level:  L3, L4, L5,Medial Branch Level(s). Injecting these levels blocks the L3-4, L4-5,  lumbar facet joints. Laterality: Bilateral  Type: Moderate (Conscious) Sedation combined with Local Anesthesia Indication(s): Analgesia and Anxiety Route: Intravenous (IV) IV Access: Secured Sedation: Meaningful verbal contact was maintained at all times during the procedure  Local Anesthetic: Lidocaine 2%   Indications: 1. Lumbar spondylosis   2. Facet syndrome, lumbar    Pain Score: Pre-procedure: 8 /10 Post-procedure: 0-No pain/10  Pre-op Assessment:  Stacy Martinez is a 64 y.o. (year old), female patient, seen today for interventional treatment. She  has a past surgical history that includes Colon surgery; Hernia repair; and Wrist fracture surgery (Right). Stacy Martinez has a current medication list which includes the following prescription(s): amlodipine, carvedilol, diclofenac, jardiance, enalapril, furosemide, gabapentin, relion glucose test strips, pen needles, lantus solostar, relion ultra thin lancets, rosuvastatin, sitagliptin, tizanidine, and ondansetron, and the following Facility-Administered Medications: bupivacaine (pf), fentanyl, lidocaine (pf), midazolam, orphenadrine, sodium chloride flush, and triamcinolone acetonide. Her primarily concern today  is the Back Pain (lower)  Initial Vital Signs:  Pulse/HCG Rate: 80ECG Heart Rate: 77 (nsr) Temp: (!) 97.5 F (36.4 C) Resp: 16 BP: 118/60 SpO2: 98 %  BMI: Estimated body mass index is 43.9 kg/m as calculated from the following:   Height as of this encounter: 5\' 2"  (1.575 m).   Weight as of this encounter: 240 lb (108.9 kg).  Risk Assessment: Allergies: Reviewed. She is allergic to prednisone and metformin and related.  Allergy Precautions: None required Coagulopathies: Reviewed. None identified.  Blood-thinner therapy: None at this time Active Infection(s): Reviewed. None identified. Stacy Martinez is afebrile  Site Confirmation: Stacy Martinez was asked to confirm the procedure and laterality before marking the site Procedure checklist: Completed Consent: Before the procedure and under the influence of no sedative(s), amnesic(s), or anxiolytics, the patient was informed of the treatment options, risks and possible complications. To fulfill our ethical and legal obligations, as recommended by the American Medical Association's Code of Ethics, I have informed the patient of my clinical impression; the nature and purpose of the treatment or procedure; the risks, benefits, and possible complications of the intervention; the alternatives, including doing nothing; the risk(s) and benefit(s) of the alternative treatment(s) or procedure(s); and the risk(s) and benefit(s) of doing nothing. The patient was provided information about the general risks and possible complications associated with the procedure. These may include, but are not limited to: failure to achieve desired goals, infection, bleeding, organ or nerve damage, allergic reactions, paralysis, and death. In addition, the patient was informed of those risks and complications associated to Spine-related procedures, such as failure to decrease pain; infection (i.e.: Meningitis, epidural or intraspinal abscess); bleeding (i.e.: epidural hematoma,  subarachnoid hemorrhage, or any other type of intraspinal or peri-dural bleeding); organ or nerve damage (i.e.: Any type of peripheral nerve, nerve root, or spinal cord injury) with subsequent  damage to sensory, motor, and/or autonomic systems, resulting in permanent pain, numbness, and/or weakness of one or several areas of the body; allergic reactions; (i.e.: anaphylactic reaction); and/or death. Furthermore, the patient was informed of those risks and complications associated with the medications. These include, but are not limited to: allergic reactions (i.e.: anaphylactic or anaphylactoid reaction(s)); adrenal axis suppression; blood sugar elevation that in diabetics may result in ketoacidosis or comma; water retention that in patients with history of congestive heart failure may result in shortness of breath, pulmonary edema, and decompensation with resultant heart failure; weight gain; swelling or edema; medication-induced neural toxicity; particulate matter embolism and blood vessel occlusion with resultant organ, and/or nervous system infarction; and/or aseptic necrosis of one or more joints. Finally, the patient was informed that Medicine is not an exact science; therefore, there is also the possibility of unforeseen or unpredictable risks and/or possible complications that may result in a catastrophic outcome. The patient indicated having understood very clearly. We have given the patient no guarantees and we have made no promises. Enough time was given to the patient to ask questions, all of which were answered to the patient's satisfaction. Stacy Martinez has indicated that she wanted to continue with the procedure. Attestation: I, the ordering provider, attest that I have discussed with the patient the benefits, risks, side-effects, alternatives, likelihood of achieving goals, and potential problems during recovery for the procedure that I have provided informed consent. Date   Time: 04/23/2020 10:55  AM  Pre-Procedure Preparation:  Monitoring: As per clinic protocol. Respiration, ETCO2, SpO2, BP, heart rate and rhythm monitor placed and checked for adequate function Safety Precautions: Patient was assessed for positional comfort and pressure points before starting the procedure. Time-out: I initiated and conducted the "Time-out" before starting the procedure, as per protocol. The patient was asked to participate by confirming the accuracy of the "Time Out" information. Verification of the correct person, site, and procedure were performed and confirmed by me, the nursing staff, and the patient. "Time-out" conducted as per Joint Commission's Universal Protocol (UP.01.01.01). Time: 1135  Description of Procedure:       Position: Prone Laterality: Bilateral. The procedure was performed in identical fashion on both sides. Levels:   L3, L4, L5, Medial Branch Level(s) Area Prepped: Posterior Lumbosacral Region Prepping solution: ChloraPrep (2% chlorhexidine gluconate and 70% isopropyl alcohol) Safety Precautions: Aspiration looking for blood return was conducted prior to all injections. At no point did we inject any substances, as a needle was being advanced. Before injecting, the patient was told to immediately notify me if she was experiencing any new onset of "ringing in the ears, or metallic taste in the mouth". No attempts were made at seeking any paresthesias. Safe injection practices and needle disposal techniques used. Medications properly checked for expiration dates. SDV (single dose vial) medications used. After the completion of the procedure, all disposable equipment used was discarded in the proper designated medical waste containers. Local Anesthesia: Protocol guidelines were followed. The patient was positioned over the fluoroscopy table. The area was prepped in the usual manner. The time-out was completed. The target area was identified using fluoroscopy. A 12-in long, straight, sterile  hemostat was used with fluoroscopic guidance to locate the targets for each level blocked. Once located, the skin was marked with an approved surgical skin marker. Once all sites were marked, the skin (epidermis, dermis, and hypodermis), as well as deeper tissues (fat, connective tissue and muscle) were infiltrated with a small amount of a short-acting local  anesthetic, loaded on a 10cc syringe with a 25G, 1.5-in  Needle. An appropriate amount of time was allowed for local anesthetics to take effect before proceeding to the next step. Local Anesthetic: Lidocaine 2.0% The unused portion of the local anesthetic was discarded in the proper designated containers.  Technical explanation of process:   L3 Medial Branch Nerve Block (MBB): The target area for the L3 medial branch is at the junction of the postero-lateral aspect of the superior articular process and the superior, posterior, and medial edge of the transverse process of L4. Under fluoroscopic guidance, a Quincke needle was inserted until contact was made with os over the superior postero-lateral aspect of the pedicular shadow (target area). After negative aspiration for blood, 37mL of the nerve block solution was injected without difficulty or complication. The needle was removed intact. L4 Medial Branch Nerve Block (MBB): The target area for the L4 medial branch is at the junction of the postero-lateral aspect of the superior articular process and the superior, posterior, and medial edge of the transverse process of L5. Under fluoroscopic guidance, a Quincke needle was inserted until contact was made with os over the superior postero-lateral aspect of the pedicular shadow (target area). After negative aspiration for blood, 1 mL of the nerve block solution was injected without difficulty or complication. The needle was removed intact. L5 Medial Branch Nerve Block (MBB): The target area for the L5 medial branch is at the junction of the postero-lateral  aspect of the superior articular process and the superior, posterior, and medial edge of the sacral ala. Under fluoroscopic guidance, a Quincke needle was inserted until contact was made with os over the superior postero-lateral aspect of the pedicular shadow (target area). After negative aspiration for blood,1  mL of the nerve block solution was injected without difficulty or complication. The needle was removed intact.  Procedural Needles: 22-gauge, 3.5-inch, Quincke needles used for all levels. Nerve block solution: 10 cc solution made of 8 cc of 0.2% ropivacaine, 2 cc of Decadron 10 mg/cc.  1 to 1.5 cc injected at each level bilaterally.  The unused portion of the solution was discarded in the proper designated containers.  Once the entire procedure was completed, the treated area was cleaned, making sure to leave some of the prepping solution back to take advantage of its long term bactericidal properties.   Illustration of the posterior view of the lumbar spine and the posterior neural structures. Laminae of L2 through S1 are labeled. DPRL5, dorsal primary ramus of L5; DPRS1, dorsal primary ramus of S1; DPR3, dorsal primary ramus of L3; FJ, facet (zygapophyseal) joint L3-L4; I, inferior articular process of L4; LB1, lateral branch of dorsal primary ramus of L1; IAB, inferior articular branches from L3 medial branch (supplies L4-L5 facet joint); IBP, intermediate branch plexus; MB3, medial branch of dorsal primary ramus of L3; NR3, third lumbar nerve root; S, superior articular process of L5; SAB, superior articular branches from L4 (supplies L4-5 facet joint also); TP3, transverse process of L3.  Vitals:   04/23/20 1154 04/23/20 1203 04/23/20 1213 04/23/20 1223  BP: 125/61 (!) 112/56 122/61 120/60  Pulse:      Resp: 14 16 16 16   Temp:  97.9 F (36.6 C)  (!) 97.5 F (36.4 C)  TempSrc:      SpO2: 94% 97% 95% 98%  Weight:      Height:        Start Time: 1135 hrs. End Time:    hrs.  Imaging Guidance (  Spinal):  Type of Imaging Technique: Fluoroscopy Guidance (Spinal) Indication(s): Assistance in needle guidance and placement for procedures requiring needle placement in or near specific anatomical locations not easily accessible without such assistance. Exposure Time: Please see nurses notes. Contrast: None used. Fluoroscopic Guidance: I was personally present during the use of fluoroscopy. "Tunnel Vision Technique" used to obtain the best possible view of the target area. Parallax error corrected before commencing the procedure. "Direction-depth-direction" technique used to introduce the needle under continuous pulsed fluoroscopy. Once target was reached, antero-posterior, oblique, and lateral fluoroscopic projection used confirm needle placement in all planes. Images permanently stored in EMR. Interpretation: No contrast injected. I personally interpreted the imaging intraoperatively. Adequate needle placement confirmed in multiple planes. Permanent images saved into the patient's record.  Antibiotic Prophylaxis:   Anti-infectives (From admission, onward)   None     Indication(s): None identified  Post-operative Assessment:  Post-procedure Vital Signs:  Pulse/HCG Rate: 8070 Temp: (!) 97.5 F (36.4 C) Resp: 16 BP: 120/60 SpO2: 98 %  EBL: None  Complications: No immediate post-treatment complications observed by team, or reported by patient.  Note: The patient tolerated the entire procedure well. A repeat set of vitals were taken after the procedure and the patient was kept under observation following institutional policy, for this type of procedure. Post-procedural neurological assessment was performed, showing return to baseline, prior to discharge. The patient was provided with post-procedure discharge instructions, including a section on how to identify potential problems. Should any problems arise concerning this procedure, the patient was given  instructions to immediately contact us, at any time, without hesitation. In any case, we plan to contact the patient by telephone for a follow-up status report regarding this interventional procedure.  Comments:  No additional relevant information. 5 out of 5 strength bilateral lower extremity: Plantar flexion, dorsiflexion, knee flexion, knee extension.  Plan of Care  Patient was provided home exercise physical therapy regimen to do for her low back range of motion and paraspinal muscle strengthening.  Patient will follow up in 4-6 weeks to discuss future steps which could include lumbar radiofrequency ablation or lumbar medial branch Sprint peripheral nerve stimulation.  Imaging Orders     DG PAIN CLINIC C-ARM 1-60 MIN NO REPORT  Procedure Orders    No procedure(s) ordered today    Medications ordered for procedure: Meds ordered this encounter  Medications   lidocaine (XYLOCAINE) 2 % (with pres) injection 400 mg   fentaNYL (SUBLIMAZE) injection 25-50 mcg    Make sure Narcan is available in the pyxis when using this medication. In the event of respiratory depression (RR< 8/min): Titrate NARCAN (naloxone) in increments of 0.1 to 0.2 mg IV at 2-3 minute intervals, until desired degree of reversal.   ropivacaine (PF) 2 mg/mL (0.2%) (NAROPIN) injection 9 mL   ropivacaine (PF) 2 mg/mL (0.2%) (NAROPIN) injection 9 mL   dexamethasone (DECADRON) injection 10 mg   dexamethasone (DECADRON) injection 10 mg   Medications administered: We administered lidocaine, fentaNYL, ropivacaine (PF) 2 mg/mL (0.2%), ropivacaine (PF) 2 mg/mL (0.2%), dexamethasone, and dexamethasone.  See the medical record for exact dosing, route, and time of administration.  New Prescriptions   No medications on file   Disposition: Discharge home  Discharge Date & Time: 04/23/2020; 1227 hrs.   Physician-requested Follow-up: Return in about 6 weeks (around 06/04/2020) for Post Procedure Evaluation,  virtual.  Future Appointments  Date Time Provider [place]  06/04/2020  3:00 PM Gillis Santa, MD [facility] None   Primary Care  Physician: Verl Bangs, FNP Location: [facility] Note by: Gillis Santa, MD Date: 04/23/2020; Time: 2:06 PM  Disclaimer:  Medicine is not an exact science. The only guarantee in medicine is that nothing is guaranteed. It is important to note that the decision to proceed with this intervention was based on the information collected from the patient. The Data and conclusions were drawn from the patient's questionnaire, the interview, and the physical examination. Because the information was provided in large part by the patient, it cannot be guaranteed that it has not been purposely or unconsciously manipulated. Every effort has been made to obtain as much relevant data as possible for this evaluation. It is important to note that the conclusions that lead to this procedure are derived in large part from the available data. Always take into account that the treatment will also be dependent on availability of resources and existing treatment guidelines, considered by other Pain Management Practitioners as being common knowledge and practice, at the time of the intervention. For Medico-Legal purposes, it is also important to point out that variation in procedural techniques and pharmacological choices are the acceptable norm. The indications, contraindications, technique, and results of the above procedure should only be interpreted and judged by a Board-Certified Interventional Pain Specialist with extensive familiarity and expertise in the same exact procedure and technique.

## 2020-04-24 ENCOUNTER — Telehealth: Payer: Self-pay

## 2020-04-24 NOTE — Telephone Encounter (Signed)
Post procedure Phone call.  Patient states her back feels great.

## 2020-04-25 ENCOUNTER — Other Ambulatory Visit: Payer: Self-pay | Admitting: Family Medicine

## 2020-04-25 DIAGNOSIS — E1142 Type 2 diabetes mellitus with diabetic polyneuropathy: Secondary | ICD-10-CM

## 2020-04-27 ENCOUNTER — Telehealth: Payer: Self-pay

## 2020-05-10 ENCOUNTER — Other Ambulatory Visit: Payer: Self-pay | Admitting: Family Medicine

## 2020-05-10 DIAGNOSIS — E1142 Type 2 diabetes mellitus with diabetic polyneuropathy: Secondary | ICD-10-CM

## 2020-05-18 ENCOUNTER — Ambulatory Visit (INDEPENDENT_AMBULATORY_CARE_PROVIDER_SITE_OTHER): Payer: 59 | Admitting: Family Medicine

## 2020-05-18 ENCOUNTER — Other Ambulatory Visit: Payer: Self-pay

## 2020-05-18 ENCOUNTER — Encounter: Payer: Self-pay | Admitting: Family Medicine

## 2020-05-18 VITALS — BP 126/56 | HR 72 | Temp 98.0°F | Ht 62.0 in | Wt 252.4 lb

## 2020-05-18 DIAGNOSIS — R8290 Unspecified abnormal findings in urine: Secondary | ICD-10-CM

## 2020-05-18 DIAGNOSIS — R3911 Hesitancy of micturition: Secondary | ICD-10-CM | POA: Diagnosis not present

## 2020-05-18 DIAGNOSIS — H18822 Corneal disorder due to contact lens, left eye: Secondary | ICD-10-CM | POA: Insufficient documentation

## 2020-05-18 LAB — POCT URINALYSIS DIPSTICK
Bilirubin, UA: NEGATIVE
Blood, UA: NEGATIVE
Glucose, UA: POSITIVE — AB
Ketones, UA: NEGATIVE
Leukocytes, UA: NEGATIVE
Nitrite, UA: NEGATIVE
Protein, UA: NEGATIVE
Spec Grav, UA: 1.015 (ref 1.010–1.025)
Urobilinogen, UA: 0.2 E.U./dL
pH, UA: 5 (ref 5.0–8.0)

## 2020-05-18 MED ORDER — POLYMYXIN B-TRIMETHOPRIM 10000-0.1 UNIT/ML-% OP SOLN: 1.0000 [drp] | OPHTHALMIC | 0 refills | Status: DC

## 2020-05-18 NOTE — Assessment & Plan Note (Signed)
Left eye corneal abrasion x 2 days.  Will treat with Polytrim 1 drop every 4 hours for the next 5-7 days.  To not wear contacts until completing antibiotic drops.  Plan: 1. Begin polytrim 1 drop every 4 hours for the next 5-7 days 2. RTC if symptoms worsen or fail to improve

## 2020-05-18 NOTE — Progress Notes (Signed)
Pt reports twisting her leg and injuring her Rt knee stepping out of her car today before coming in the office. She doesn't feel like she need to address that today.

## 2020-05-18 NOTE — Assessment & Plan Note (Signed)
U/A with increased glucose.  Encouraged patient to schedule f/u appt for her diabetes.  Plan: 1. RTC in 1-2 weeks for T2DM F/U appt

## 2020-05-18 NOTE — Progress Notes (Signed)
Subjective:    Patient ID: Stacy Martinez, female    DOB: [birth year], 64 y.o.   MRN: [id number]  Stacy Martinez is a 64 y.o. female presenting on 05/18/2020 for Eye Injury (pt think she might have scratched her Lt eye removing her contact x 2 days ago. She fell the sleep with her contact in and got up to remove the contact without using any eye drop solution and think that when she scratched it. Lt eye eye swollen, erythema, sensitivity to light  and burning sensation )   HPI  Stacy Martinez presents to clinic for concerns of left eye injury x 2 days.  Reports she had fallen asleep with her contacts in, woke up, felt that they were dry and went to remove them.  Believes she may have scratched her left eye when removing contact.  Has left eye discomfort, sensitivity to light and burning.  Has not taken anything for symptoms.  Denies discharge or pain with eye movement.  Depression screen [facility] 2/9 04/23/2020 04/05/2020 11/11/2019  Decreased Interest 0 0 0  Down, Depressed, Hopeless 0 0 0  PHQ - 2 Score 0 0 0    Social History   Tobacco Use  . Smoking status: Never Smoker  . Smokeless tobacco: Never Used  Vaping Use  . Vaping Use: Never used  Substance Use Topics  . Alcohol use: No    Alcohol/week: 0.0 standard drinks  . Drug use: No    Review of Systems  Constitutional: Negative.   HENT: Negative.   Eyes: Positive for photophobia and pain. Negative for discharge, redness, itching and visual disturbance.  Respiratory: Negative.   Cardiovascular: Negative.   Gastrointestinal: Negative.   Endocrine: Negative.   Genitourinary: Negative.   Musculoskeletal: Negative.   Skin: Negative.   Allergic/Immunologic: Negative.   Neurological: Negative.   Hematological: Negative.   Psychiatric/Behavioral: Negative.    Per HPI unless specifically indicated above     Objective:    BP (!) 126/56 (BP Location: Left Arm, Patient Position: Sitting, Cuff Size: Large)   Pulse 72   Temp 98 F (36.7 C)  (Oral)   Ht 5\' 2"  (1.575 m)   Wt 252 lb 6.4 oz (114.5 kg)   SpO2 98%   BMI 46.16 kg/m   Wt Readings from Last 3 Encounters:  05/18/20 252 lb 6.4 oz (114.5 kg)  04/23/20 240 lb (108.9 kg)  04/05/20 240 lb (108.9 kg)    Physical Exam Vitals reviewed.  Constitutional:      General: She is not in acute distress.    Appearance: Normal appearance. She is well-developed and well-groomed. She is not ill-appearing or toxic-appearing.  HENT:     Head: Normocephalic and atraumatic.     Nose:     Comments: Lizbeth Bark is in place, covering mouth and nose. Eyes:     General: Lids are normal. Vision grossly intact.        Right eye: No discharge.        Left eye: No discharge.     Extraocular Movements: Extraocular movements intact.     Conjunctiva/sclera:     Left eye: Left conjunctiva is injected.     Pupils: Pupils are equal, round, and reactive to light.      Comments: Left eye stained with due for evaluation under ultraviolet lighting.  Corneal abrasion noted x 1 left eye.  Cardiovascular:     Pulses: Normal pulses.          Dorsalis pedis  pulses are 2+ on the right side and 2+ on the left side.  Pulmonary:     Effort: Pulmonary effort is normal. No respiratory distress.  Musculoskeletal:     Right lower leg: No edema.     Left lower leg: No edema.  Skin:    General: Skin is warm and dry.     Capillary Refill: Capillary refill takes less than 2 seconds.  Neurological:     General: No focal deficit present.     Mental Status: She is alert and oriented to person, place, and time.  Psychiatric:        Attention and Perception: Attention and perception normal.        Mood and Affect: Mood and affect normal.        Speech: Speech normal.        Behavior: Behavior normal. Behavior is cooperative.        Thought Content: Thought content normal.        Cognition and Memory: Cognition and memory normal.        Judgment: Judgment normal.    Results for orders placed or performed in  visit on 05/18/20  POCT Urinalysis Dipstick  Result Value Ref Range   Color, UA yellow    Clarity, UA clear    Glucose, UA Positive (A) Negative   Bilirubin, UA negative    Ketones, UA negative    Spec Grav, UA 1.015 1.010 - 1.025   Blood, UA negative    pH, UA 5.0 5.0 - 8.0   Protein, UA Negative Negative   Urobilinogen, UA 0.2 0.2 or 1.0 E.U./dL   Nitrite, UA negative    Leukocytes, UA Negative Negative   Appearance     Odor        Assessment & Plan:   Problem List Items Addressed This Visit      Other   Corneal abrasion of left eye due to contact lens - Primary    Left eye corneal abrasion x 2 days.  Will treat with Polytrim 1 drop every 4 hours for the next 5-7 days.  To not wear contacts until completing antibiotic drops.  Plan: 1. Begin polytrim 1 drop every 4 hours for the next 5-7 days 2. RTC if symptoms worsen or fail to improve      Relevant Medications   trimethoprim-polymyxin b (POLYTRIM) ophthalmic solution   Urinary hesitancy    U/A with increased glucose.  Encouraged patient to schedule f/u appt for her diabetes.  Plan: 1. RTC in 1-2 weeks for T2DM F/U appt      Relevant Orders   POCT Urinalysis Dipstick (Completed)    Other Visit Diagnoses    Cloudy urine       Relevant Orders   POCT Urinalysis Dipstick (Completed)      Meds ordered this encounter  Medications  . trimethoprim-polymyxin b (POLYTRIM) ophthalmic solution    Sig: Place 1 drop into the left eye every 4 (four) hours.    Dispense:  10 mL    Refill:  0    Follow up plan: Return in about 2 weeks (around 06/01/2020) for DM, A1C F/U.   Harlin Rain, [place] Family Nurse Practitioner [facility] 05/18/2020, 1:01 PM

## 2020-05-18 NOTE — Patient Instructions (Signed)
I have sent in an topical eye drop to put one drop into the left eye every 4 hours for the next 5-7 days.  Avoid wearing contact lenses while using this eye drop.  We will plan to see you back in 2 weeks for diabetes follow up visit  You will receive a survey after today's visit either digitally by e-mail or paper by [place] mail. Your experiences and feedback matter to [place].  Please respond so we know how we are doing as we provide care for you.  Call us with any questions/concerns/needs.  It is my goal to be available to you for your health concerns.  Thanks for choosing me to be a partner in your healthcare needs!  Harlin Rain, FNP-C Family Nurse Practitioner [facility] Phone: [phone]

## 2020-05-30 ENCOUNTER — Encounter: Payer: Self-pay | Admitting: Student in an Organized Health Care Education/Training Program

## 2020-06-04 ENCOUNTER — Ambulatory Visit
Payer: 59 | Attending: Student in an Organized Health Care Education/Training Program | Admitting: Student in an Organized Health Care Education/Training Program

## 2020-06-04 ENCOUNTER — Other Ambulatory Visit: Payer: Self-pay

## 2020-06-04 DIAGNOSIS — M19011 Primary osteoarthritis, right shoulder: Secondary | ICD-10-CM

## 2020-06-04 DIAGNOSIS — M25511 Pain in right shoulder: Secondary | ICD-10-CM

## 2020-06-04 DIAGNOSIS — G8929 Other chronic pain: Secondary | ICD-10-CM

## 2020-06-04 DIAGNOSIS — M47816 Spondylosis without myelopathy or radiculopathy, lumbar region: Secondary | ICD-10-CM

## 2020-06-04 NOTE — Progress Notes (Signed)
I attempted to call the patient however no response. Voicemail left instructing patient to call front desk office at [phone] to reschedule appointment. -Dr Tammala Weider  

## 2020-06-19 ENCOUNTER — Encounter: Payer: Self-pay | Admitting: Student in an Organized Health Care Education/Training Program

## 2020-06-19 ENCOUNTER — Ambulatory Visit
Payer: 59 | Attending: Student in an Organized Health Care Education/Training Program | Admitting: Student in an Organized Health Care Education/Training Program

## 2020-06-19 ENCOUNTER — Other Ambulatory Visit: Payer: Self-pay

## 2020-06-19 VITALS — BP 133/65 | HR 77 | Temp 98.1°F | Resp 16 | Ht 62.0 in | Wt 240.0 lb

## 2020-06-19 DIAGNOSIS — M67911 Unspecified disorder of synovium and tendon, right shoulder: Secondary | ICD-10-CM | POA: Diagnosis present

## 2020-06-19 DIAGNOSIS — M25511 Pain in right shoulder: Secondary | ICD-10-CM | POA: Diagnosis not present

## 2020-06-19 DIAGNOSIS — G8929 Other chronic pain: Secondary | ICD-10-CM | POA: Diagnosis present

## 2020-06-19 DIAGNOSIS — M778 Other enthesopathies, not elsewhere classified: Secondary | ICD-10-CM

## 2020-06-19 DIAGNOSIS — M19011 Primary osteoarthritis, right shoulder: Secondary | ICD-10-CM | POA: Insufficient documentation

## 2020-06-19 NOTE — Progress Notes (Signed)
Safety precautions to be maintained throughout the outpatient stay will include: orient to surroundings, keep bed in low position, maintain call bell within reach at all times, provide assistance with transfer out of bed and ambulation.  

## 2020-06-19 NOTE — Progress Notes (Signed)
PROVIDER NOTE: Information contained herein reflects review and annotations entered in association with encounter. Interpretation of such information and data should be left to medically-trained personnel. Information provided to patient can be located elsewhere in the medical record under "Patient Instructions". Document created using STT-dictation technology, any transcriptional errors that may result from process are unintentional.    Patient: Stacy Martinez  Service Category: E/M  Provider: Gillis Santa, MD  DOB: May 17, 1956  DOS: 06/19/2020  Specialty: Interventional Pain Management  MRN: [id number]  Setting: Ambulatory outpatient  PCP: Verl Bangs, FNP  Type: Established Patient    Referring Provider: Verl Bangs, FNP  Location: Office  Delivery: Face-to-face     HPI  [name], a 64 y.o. year old female, is here today because of her Primary osteoarthritis of right shoulder [M19.011]. Stacy Martinez primary complain today is Back Pain (lower) and Shoulder Pain (right) Last encounter: My last encounter with her was on 04/23/2020. Pertinent problems: Stacy Martinez has Facet syndrome, lumbar; DJD (degenerative joint disease) of knee; Sacroiliac joint dysfunction; Disorder of right rotator cuff; Chronic right shoulder pain; Primary osteoarthritis of right shoulder; and Lumbar spondylosis on their pertinent problem list. Pain Assessment: Severity of Chronic pain is reported as a 10-Worst pain ever/10. Location: Back Lower/down both legs to knees. Onset: More than a month ago. Quality: Aching, Dull, Constant. Timing: Constant. Modifying factor(s): meds help, but only at night when home; cannot take meds during day at work; gaba dulls ache. Vitals:  height is 5' 2" (1.575 m) and weight is 240 lb (108.9 kg). Her temporal temperature is 98.1 F (36.7 C). Her blood pressure is 133/65 and her pulse is 77. Her respiration is 16 and oxygen saturation is 96%.   Reason for encounter: worsening of previously  known (established) problem   Worsening right shoulder pain.  Having difficulty performing ADLs.  States that she has to use her left arm to raise her right arm at times.  Status post 2 glenohumeral joint injections for her right shoulder with the second 1 not providing as much relief.  A referral was placed to orthopedic surgery on April 28, 2019 however patient states that due to Covid she did not follow-up.  She states that her pain is very severe and impacting her ability to perform ADLs.  Patient has a history of claustrophobia.  I will place a referral to Dr. Leim Martinez with orthopedics and recommend right shoulder MRI without contrast.  Continues to have low back pain related to lumbar facet arthropathy.  Discussed therapeutic lumbar radiofrequency ablation versus Sprint peripheral nerve stimulation of lumbar medial branch.  Did not get as much relief as before with her therapeutic lumbar facet medial branch nerve blocks hence our discussion today about advancing treatment to radiofrequency ablation or peripheral nerve stimulation of medial branch.  Patient will think about this further.  She continues gabapentin and tizanidine at night.  She has been utilizing a gabapentin dose during the day as well.  She prefers to avoid doing this as it does result in mild sedation.   ROS  Constitutional: Denies any fever or chills Gastrointestinal: No reported hemesis, hematochezia, vomiting, or acute GI distress Musculoskeletal: Right shoulder pain Neurological: No reported episodes of acute onset apraxia, aphasia, dysarthria, agnosia, amnesia, paralysis, loss of coordination, or loss of consciousness  Medication Review  Pen Needles, ReliOn Ultra Thin Lancets, amLODipine, carvedilol, diclofenac, empagliflozin, enalapril, furosemide, gabapentin, glucose blood, insulin glargine, rosuvastatin, sitaGLIPtin, tiZANidine, and trimethoprim-polymyxin b  History Review  Allergy: Stacy Martinez is allergic to  prednisone and metformin and related. Drug: Stacy Martinez  reports no history of drug use. Alcohol:  reports no history of alcohol use. Tobacco:  reports that she has never smoked. She has never used smokeless tobacco. Social: Stacy Martinez  reports that she has never smoked. She has never used smokeless tobacco. She reports that she does not drink alcohol and does not use drugs. Medical:  has a past medical history of Allergy, Arthritis, Diabetes mellitus without complication ([place]), Enlarged heart, and Hypertension. Surgical: Stacy Martinez  has a past surgical history that includes Colon surgery; Hernia repair; and Wrist fracture surgery (Right). Family: family history includes Cancer in her father; Diabetes in her mother; Mental illness in her son.  Laboratory Chemistry Profile   Renal Lab Results  Component Value Date   BUN 27 (H) 03/22/2019   CREATININE 0.73 03/22/2019   BCR 37 (H) 03/22/2019   GFRAA 102 03/22/2019   GFRNONAA 88 03/22/2019     Hepatic Lab Results  Component Value Date   AST 15 03/22/2019   ALT 20 03/22/2019   ALBUMIN 4.1 04/08/2017   ALKPHOS 119 04/08/2017   LIPASE 19 12/18/2013     Electrolytes Lab Results  Component Value Date   NA 136 03/22/2019   K 4.7 03/22/2019   CL 102 03/22/2019   CALCIUM 10.2 03/22/2019     Bone No results found for: VD25OH, VD125OH2TOT, XT0569VX4, IA1655VZ4, 25OHVITD1, 25OHVITD2, 25OHVITD3, TESTOFREE, TESTOSTERONE   Inflammation (CRP: Acute Phase) (ESR: Chronic Phase) Lab Results  Component Value Date   LATICACIDVEN 1.72 01/02/2014       Note: Above Lab results reviewed.   Physical Exam  General appearance: alert, cooperative and in mild distress Mental status: Alert, oriented x 3 (person, place, & time)       Respiratory: No evidence of acute respiratory distress Eyes: PERLA Vitals: BP 133/65   Pulse 77   Temp 98.1 F (36.7 C) (Temporal)   Resp 16   Ht 5' 2" (1.575 m)   Wt 240 lb (108.9 kg)   SpO2 96%   BMI 43.90 kg/m   BMI: Estimated body mass index is 43.9 kg/m as calculated from the following:   Height as of this encounter: 5' 2" (1.575 m).   Weight as of this encounter: 240 lb (108.9 kg). Ideal: Ideal body weight: 50.1 kg (110 lb 7.2 oz) Adjusted ideal body weight: 73.6 kg (162 lb 4.3 oz)   Upper Extremity (UE) Exam    Side: Right upper extremity  Side: Left upper extremity  Skin & Extremity Inspection: Skin color, temperature, and hair growth are WNL. No peripheral edema or cyanosis. No masses, redness, swelling, asymmetry, or associated skin lesions. No contractures.  Skin & Extremity Inspection: Skin color, temperature, and hair growth are WNL. No peripheral edema or cyanosis. No masses, redness, swelling, asymmetry, or associated skin lesions. No contractures.  Functional ROM: Pain restricted ROM for shoulder  Functional ROM: Unrestricted ROM          Muscle Tone/Strength: Functionally intact. No obvious neuro-muscular anomalies detected.  Muscle Tone/Strength: Functionally intact. No obvious neuro-muscular anomalies detected.  Sensory (Neurological): Arthropathic arthralgia          Sensory (Neurological): Unimpaired          Palpation: No palpable anomalies              Palpation: No palpable anomalies  Provocative Test(s):  Phalen's test: deferred Tinel's test: deferred Apley's scratch test (touch opposite shoulder):  Action 1 (Across chest): Decreased ROM Action 2 (Overhead): Decreased ROM Action 3 (LB reach): Decreased ROM   Provocative Test(s):  Phalen's test: deferred Tinel's test: deferred Apley's scratch test (touch opposite shoulder):  Action 1 (Across chest): deferred Action 2 (Overhead): deferred Action 3 (LB reach): deferred    Lumbar Spine Area Exam  Skin & Axial Inspection: No masses, redness, or swelling Alignment: Symmetrical Functional ROM: Pain restricted ROM affecting both sides Stability: No instability detected Muscle Tone/Strength: Functionally intact.  No obvious neuro-muscular anomalies detected. Sensory (Neurological): Musculoskeletal pain pattern  Provocative Tests: Hyperextension/rotation test: (+) bilaterally for facet joint pain. Lumbar quadrant test (Kemp's test): (+) bilaterally for facet joint pain. Lateral bending test: (+) due to pain. Patrick's Maneuver: deferred today                   FABER* test: deferred today                   S-I anterior distraction/compression test: deferred today         S-I lateral compression test: deferred today         S-I Thigh-thrust test: deferred today         S-I Gaenslen's test: deferred today         *(Flexion, ABduction and External Rotation) Lower Extremity Exam    Side: Right lower extremity  Side: Left lower extremity  Stability: No instability observed          Stability: No instability observed          Skin & Extremity Inspection: Skin color, temperature, and hair growth are WNL. No peripheral edema or cyanosis. No masses, redness, swelling, asymmetry, or associated skin lesions. No contractures.  Skin & Extremity Inspection: Skin color, temperature, and hair growth are WNL. No peripheral edema or cyanosis. No masses, redness, swelling, asymmetry, or associated skin lesions. No contractures.  Functional ROM: Unrestricted ROM                  Functional ROM: Unrestricted ROM                  Muscle Tone/Strength: Functionally intact. No obvious neuro-muscular anomalies detected.  Muscle Tone/Strength: Functionally intact. No obvious neuro-muscular anomalies detected.  Sensory (Neurological): Unimpaired        Sensory (Neurological): Unimpaired        DTR: Patellar: deferred today Achilles: deferred today Plantar: deferred today  DTR: Patellar: deferred today Achilles: deferred today Plantar: deferred today  Palpation: No palpable anomalies  Palpation: No palpable anomalies    Assessment   Status Diagnosis  Worsening Worsening Worsening 1. Primary osteoarthritis of right  shoulder   2. Chronic right shoulder pain   3. Disorder of right rotator cuff   4. Right shoulder tendonitis        Plan of Care   1.  Right shoulder MRI without contrast.  Patient does have history of claustrophobia so recommend pre imaging anxiolysis with Valium.  Could also consider open MRI. 2.  Referral to Dr. Leim Martinez with orthopedics 3.  For low back pain related to lumbar facet arthropathy with diminishing returns from lumbar facet medial branch nerve blocks, discussed lumbar radiofrequency ablation and or Sprint peripheral nerve stimulation of lumbar medial branch as advanced pain therapies to consider for her low back pain. 4.  Continue diclofenac, gabapentin, tizanidine as needed.  No  refills needed.   Orders:  Orders Placed This Encounter  Procedures  . MR SHOULDER RIGHT WO CONTRAST    Standing Status:   Future    Standing Expiration Date:   12/18/2020    Order Specific Question:   What is the patient's sedation requirement?    Answer:   Anti-anxiety    Order Specific Question:   Does the patient have a pacemaker or implanted devices?    Answer:   No    Order Specific Question:   Preferred imaging location?    Answer:   [facility] (table limit - [phone])    Order Specific Question:   Radiology Contrast Protocol - do NOT remove file path    Answer:   _0 [email or web address]  . AMB referral to orthopedics    Referral Priority:   Routine    Referral Type:   Consultation    Referred to Provider:   Leim Fabry, MD    Requested Specialty:   Orthopedic Surgery    Number of Visits Requested:   1   Follow-up plan:   Return if symptoms worsen or fail to improve.     Status post L2, L3, L4, L5 facet medial branch nerve block on 03/21/2019: Helpful.  Status post right shoulder steroid injection #2 on 03/09/2019 which was not helpful, having worsening shoulder pain.  Consider sprint axillary nerve peripheral stimulation, consider lumbar medial branch  peripheral nerve stimulation     Recent Visits Date Type Provider Dept  06/04/20 Telemedicine Gillis Santa, MD [facility]  04/23/20 Procedure visit Gillis Santa, MD [facility]  04/05/20 Office Visit Gillis Santa, MD [facility]  Showing recent visits within past 90 days and meeting all other requirements Today's Visits Date Type Provider Dept  06/19/20 Office Visit Gillis Santa, MD [facility]  Showing today's visits and meeting all other requirements Future Appointments No visits were found meeting these conditions. Showing future appointments within next 90 days and meeting all other requirements  I discussed the assessment and treatment plan with the patient. The patient was provided an opportunity to ask questions and all were answered. The patient agreed with the plan and demonstrated an understanding of the instructions.  Patient advised to call back or seek an in-person evaluation if the symptoms or condition worsens.  Duration of encounter:26mnutes.  Note by: BGillis Santa MD Date: 06/19/2020; Time: 8:51 AM

## 2020-06-19 NOTE — Patient Instructions (Signed)
Referral to orthopedics.  You will have CT of RIGHT shoulder.

## 2020-06-21 ENCOUNTER — Telehealth: Payer: Self-pay | Admitting: Student in an Organized Health Care Education/Training Program

## 2020-06-21 NOTE — Telephone Encounter (Signed)
Dr. Holley Raring Patient says she will need medication or sedation for her MRI. If her insurance will cover. She tried to return your call but was unable to get an answer on the number you gave her. She has [employer]. They will require documentation for medical necessity of the MRI and why she needs sedation.

## 2020-06-25 ENCOUNTER — Telehealth: Payer: Self-pay

## 2020-06-25 NOTE — Telephone Encounter (Signed)
Blanch Media, can you help with this?

## 2020-06-25 NOTE — Telephone Encounter (Signed)
Do we know if the patient's lumbar MRI has been denied?  I can write her for a prescription of p.o. Valium so that she can take prior to her MRI.  Please look at my last note regarding why she needs an MRI of her shoulder.

## 2020-07-02 ENCOUNTER — Other Ambulatory Visit: Payer: Self-pay | Admitting: Family Medicine

## 2020-07-02 ENCOUNTER — Telehealth: Payer: Self-pay

## 2020-07-02 DIAGNOSIS — E1142 Type 2 diabetes mellitus with diabetic polyneuropathy: Secondary | ICD-10-CM

## 2020-07-02 DIAGNOSIS — M19011 Primary osteoarthritis, right shoulder: Secondary | ICD-10-CM

## 2020-07-02 MED ORDER — DIAZEPAM 5 MG PO TABS: 5.0000 mg | ORAL_TABLET | Freq: Once | ORAL | 0 refills | Status: DC | PRN

## 2020-07-02 NOTE — Telephone Encounter (Signed)
She is having an MRI Wednesday and was told there would be something called out for her nerves. She said nothing has been called out.

## 2020-07-02 NOTE — Telephone Encounter (Signed)
Can you prescribed her Valium for her MRI on Wednesday? I will be glad to call and let her know.

## 2020-07-02 NOTE — Telephone Encounter (Signed)
Called to inform patient that medication was called in. No answer. Lots of beeping, not sure if machine  recorded my message.

## 2020-07-04 ENCOUNTER — Other Ambulatory Visit: Payer: Self-pay

## 2020-07-04 ENCOUNTER — Ambulatory Visit
Admission: RE | Admit: 2020-07-04 | Discharge: 2020-07-04 | Disposition: A | Discharge status: Home / Self Care | Payer: 59 | Source: Ambulatory Visit | Attending: Student in an Organized Health Care Education/Training Program | Admitting: Student in an Organized Health Care Education/Training Program

## 2020-07-04 DIAGNOSIS — M7581 Other shoulder lesions, right shoulder: Secondary | ICD-10-CM

## 2020-07-04 DIAGNOSIS — M778 Other enthesopathies, not elsewhere classified: Secondary | ICD-10-CM | POA: Insufficient documentation

## 2020-07-04 DIAGNOSIS — M25511 Pain in right shoulder: Secondary | ICD-10-CM | POA: Insufficient documentation

## 2020-07-04 DIAGNOSIS — G8929 Other chronic pain: Secondary | ICD-10-CM

## 2020-07-04 DIAGNOSIS — M19011 Primary osteoarthritis, right shoulder: Secondary | ICD-10-CM | POA: Diagnosis not present

## 2020-07-04 DIAGNOSIS — M67911 Unspecified disorder of synovium and tendon, right shoulder: Secondary | ICD-10-CM

## 2020-07-05 ENCOUNTER — Other Ambulatory Visit: Payer: Self-pay | Admitting: Student in an Organized Health Care Education/Training Program

## 2020-07-05 DIAGNOSIS — M75121 Complete rotator cuff tear or rupture of right shoulder, not specified as traumatic: Secondary | ICD-10-CM

## 2020-07-10 ENCOUNTER — Other Ambulatory Visit: Payer: Self-pay | Admitting: Family Medicine

## 2020-07-10 ENCOUNTER — Ambulatory Visit
Payer: 59 | Attending: Student in an Organized Health Care Education/Training Program | Admitting: Student in an Organized Health Care Education/Training Program

## 2020-07-10 ENCOUNTER — Other Ambulatory Visit: Payer: Self-pay | Admitting: Orthopedic Surgery

## 2020-07-10 ENCOUNTER — Other Ambulatory Visit (HOSPITAL_COMMUNITY): Payer: Self-pay | Admitting: Orthopedic Surgery

## 2020-07-10 ENCOUNTER — Encounter: Payer: Self-pay | Admitting: Emergency Medicine

## 2020-07-10 ENCOUNTER — Other Ambulatory Visit: Payer: Self-pay

## 2020-07-10 ENCOUNTER — Ambulatory Visit
Admission: EM | Admit: 2020-07-10 | Discharge: 2020-07-10 | Disposition: A | Discharge status: Home / Self Care | Payer: 59 | Attending: Family Medicine | Admitting: Family Medicine

## 2020-07-10 DIAGNOSIS — E1142 Type 2 diabetes mellitus with diabetic polyneuropathy: Secondary | ICD-10-CM

## 2020-07-10 DIAGNOSIS — H109 Unspecified conjunctivitis: Secondary | ICD-10-CM | POA: Diagnosis not present

## 2020-07-10 DIAGNOSIS — I10 Essential (primary) hypertension: Secondary | ICD-10-CM

## 2020-07-10 DIAGNOSIS — M75121 Complete rotator cuff tear or rupture of right shoulder, not specified as traumatic: Secondary | ICD-10-CM

## 2020-07-10 DIAGNOSIS — M25511 Pain in right shoulder: Secondary | ICD-10-CM

## 2020-07-10 MED ORDER — CIPROFLOXACIN HCL 0.3 % OP SOLN
1.0000 [drp] | Freq: Four times a day (QID) | OPHTHALMIC | 0 refills | Status: AC
Start: 2020-07-10 — End: 2020-07-17

## 2020-07-10 NOTE — Discharge Instructions (Signed)
Medication as prescribed.  No contacts while on treatment.  Take care  Dr. Lacinda Axon

## 2020-07-10 NOTE — Progress Notes (Signed)
I have already discussed MRI results with the patient. She is scheduled for right shoulder replacement surgery. Instructed to follow-up 1 month after her surgery. Patient endorsed understanding.

## 2020-07-10 NOTE — ED Triage Notes (Signed)
Pt c/o right eye pain. She states she slept in her contacts last night and when took them out she started having eye pain. Eye is red and watery.

## 2020-07-10 NOTE — ED Provider Notes (Signed)
[facility]    CSN: [id number] Arrival date & time: 07/10/20  1626      History   Chief Complaint Chief Complaint  Patient presents with   Eye Problem    right   HPI  64 year old female presents with the above complaint.  Patient states that she accidentally slept in her contacts last night.  She took her contacts out this morning and her right eye has been bothering her since.  She reports that the eye is red and is painful.  It has been watering.  Pain 4/10 in severity.  Associated photophobia.  No relieving factors.  Left eye is unaffected.  She is currently not wearing her right contact.  No other reported symptoms.  No other complaints.  Past Medical History:  Diagnosis Date   Allergy    Arthritis    Diabetes mellitus without complication ([place])    Enlarged heart    Hypertension     Patient Active Problem List   Diagnosis Date Noted   Corneal abrasion of left eye due to contact lens 05/18/2020   Urinary hesitancy 05/18/2020   Primary osteoarthritis of right shoulder 04/28/2019   Lumbar spondylosis 04/28/2019   Disorder of right rotator cuff 11/10/2018   Chronic right shoulder pain 11/10/2018   Right shoulder tendonitis 11/10/2018   Facet syndrome, lumbar 01/15/2016   Diabetic neuropathy ([place]) 01/15/2016   DJD (degenerative joint disease) of knee 01/15/2016   Sacroiliac joint dysfunction 01/15/2016   Hyperlipidemia associated with type 2 diabetes mellitus ([place]) 11/30/2015   DDD (degenerative disc disease), lumbar 11/30/2015   Cardiomyopathy ([place]) 11/30/2015   Arthritis 11/30/2015   BP (high blood pressure) 04/18/2015   DM type 2 with diabetic peripheral neuropathy ([place]) 04/18/2015   Wound of skin 04/18/2015   Bowel obstruction ([place]) 12/19/2013    Past Surgical History:  Procedure Laterality Date   COLON SURGERY     HERNIA REPAIR     WRIST FRACTURE SURGERY Right     OB History   No obstetric history on file.       Home Medications    Prior to Admission medications   Medication Sig Start Date End Date Taking? Authorizing Provider  amLODipine (NORVASC) 10 MG tablet Take 1 tablet by mouth once daily 07/10/20  Yes Malfi, Lupita Raider, FNP  carvedilol (COREG) 25 MG tablet TAKE 1 TABLET BY MOUTH TWICE DAILY WITH A MEAL 11/11/19  Yes Malfi, Lupita Raider, FNP  diclofenac (VOLTAREN) 75 MG EC tablet Take 1 tablet (75 mg total) by mouth daily as needed for moderate pain. 04/05/20  Yes Gillis Santa, MD  enalapril (VASOTEC) 20 MG tablet Take 1 tablet by mouth twice daily 07/10/20  Yes Malfi, Lupita Raider, FNP  furosemide (LASIX) 40 MG tablet Take 2 tablets (80 mg total) by mouth daily. 11/11/19  Yes Malfi, Lupita Raider, FNP  JARDIANCE 25 MG TABS tablet Take 1 tablet by mouth once daily 04/25/20  Yes Malfi, Lupita Raider, FNP  LANTUS SOLOSTAR 100 UNIT/ML Solostar Pen INJECT 32 UNITS SUBCUTANEOUSLY ONCE DAILY 07/10/20  Yes Malfi, Lupita Raider, FNP  rosuvastatin (CRESTOR) 20 MG tablet Take 1 tablet (20 mg total) by mouth daily. 11/11/19  Yes Malfi, Lupita Raider, FNP  sitaGLIPtin (JANUVIA) 100 MG tablet Take 1 tablet by mouth once daily 03/28/20  Yes Malfi, Lupita Raider, FNP  tiZANidine (ZANAFLEX) 2 MG tablet Take 1 tablet (2 mg total) by mouth every 8 (eight) hours as needed for muscle spasms (pt not certain of dosage). 04/05/20  Yes Gillis Santa, MD  ciprofloxacin (CILOXAN) 0.3 % ophthalmic solution Place 1 drop into the right eye every 6 (six) hours for 7 days. 07/10/20 07/17/20  Coral Spikes, DO  gabapentin (NEURONTIN) 100 MG capsule Take 1 capsule by mouth at bedtime 07/10/20   Malfi, Lupita Raider, FNP  glucose blood (RELION GLUCOSE TEST STRIPS) test strip Use as instructed 11/11/19   Malfi, Lupita Raider, FNP  Insulin Pen Needle (PEN NEEDLES) 32G X 6 MM MISC 1 Device by Does not apply route daily. 11/11/19   Malfi, Lupita Raider, FNP  ReliOn Ultra Thin Lancets MISC 1 each by Does not apply route daily. 11/11/19   Malfi, Lupita Raider, FNP  trimethoprim-polymyxin b (POLYTRIM)  ophthalmic solution Place 1 drop into the left eye every 4 (four) hours. 05/18/20   Malfi, Lupita Raider, FNP    Family History Family History  Problem Relation Age of Onset   Diabetes Mother    Cancer Father    Mental illness Son     Social History Social History   Tobacco Use   Smoking status: Never Smoker   Smokeless tobacco: Never Used  Scientific laboratory technician Use: Never used  Substance Use Topics   Alcohol use: No    Alcohol/week: 0.0 standard drinks   Drug use: No     Allergies   Prednisone and Metformin and related   Review of Systems Review of Systems  Eyes: Positive for photophobia, pain and redness.     Physical Exam Triage Vital Signs ED Triage Vitals  Enc Vitals Group     BP 07/10/20 1641 (!) 151/72     Pulse Rate 07/10/20 1641 78     Resp 07/10/20 1641 18     Temp 07/10/20 1641 98.4 F (36.9 C)     Temp Source 07/10/20 1641 Oral     SpO2 07/10/20 1641 99 %     Weight 07/10/20 1638 240 lb 1.3 oz (108.9 kg)     Height 07/10/20 1638 5\' 2"  (1.575 m)     Head Circumference --      Peak Flow --      Pain Score 07/10/20 1638 4     Pain Loc --      Pain Edu? --      Excl. in [place]? --    Updated Vital Signs BP (!) 151/72 (BP Location: Right Arm)    Pulse 78    Temp 98.4 F (36.9 C) (Oral)    Resp 18    Ht 5\' 2"  (1.575 m)    Wt 108.9 kg    SpO2 99%    BMI 43.91 kg/m   Visual Acuity Right Eye Distance:   Left Eye Distance:   Bilateral Distance:    Right Eye Near:   Left Eye Near:    Bilateral Near:     Physical Exam Vitals and nursing note reviewed.  Constitutional:      General: She is not in acute distress.    Appearance: She is obese.  HENT:     Head: Normocephalic and atraumatic.  Eyes:     Comments: Right eye with mild conjunctival injection.  No current purulent discharge.  Fluorescein exam negative.  Pulmonary:     Effort: Pulmonary effort is normal. No respiratory distress.  Neurological:     Mental Status: She is alert.    Psychiatric:        Mood and Affect: Mood normal.        Behavior: Behavior  normal.    UC Treatments / Results  Labs (all labs ordered are listed, but only abnormal results are displayed) Labs Reviewed - No data to display  EKG   Radiology No results found.  Procedures Procedures (including critical care time)  Medications Ordered in UC Medications - No data to display  Initial Impression / Assessment and Plan / UC Course  I have reviewed the triage vital signs and the nursing notes.  Pertinent labs & imaging results that were available during my care of the patient were reviewed by me and considered in my medical decision making (see chart for details).    64 year old female presents with conjunctivitis of the right eye.  Placing on Cipro given the fact that she is a contact lens wear.  Advised to avoid wearing contacts until treatment is complete.  There was no evidence of corneal abrasion today.  Final Clinical Impressions(s) / UC Diagnoses   Final diagnoses:  Conjunctivitis of right eye, unspecified conjunctivitis type     Discharge Instructions     Medication as prescribed.  No contacts while on treatment.  Take care  Dr. Lacinda Axon     ED Prescriptions    Medication Sig Dispense Auth. Provider   ciprofloxacin (CILOXAN) 0.3 % ophthalmic solution Place 1 drop into the right eye every 6 (six) hours for 7 days. 5 mL Coral Spikes, DO     PDMP not reviewed this encounter.   Coral Spikes, [place] 07/10/20 1721

## 2020-07-18 ENCOUNTER — Other Ambulatory Visit: Payer: Self-pay | Admitting: Orthopedic Surgery

## 2020-07-20 ENCOUNTER — Ambulatory Visit
Admission: RE | Admit: 2020-07-20 | Discharge: 2020-07-20 | Disposition: A | Discharge status: Home / Self Care | Payer: 59 | Source: Ambulatory Visit | Attending: Orthopedic Surgery | Admitting: Orthopedic Surgery

## 2020-07-20 ENCOUNTER — Other Ambulatory Visit: Payer: Self-pay

## 2020-07-20 DIAGNOSIS — M25511 Pain in right shoulder: Secondary | ICD-10-CM

## 2020-07-31 ENCOUNTER — Other Ambulatory Visit: Payer: Self-pay | Admitting: Family Medicine

## 2020-07-31 DIAGNOSIS — E1142 Type 2 diabetes mellitus with diabetic polyneuropathy: Secondary | ICD-10-CM

## 2020-08-02 ENCOUNTER — Telehealth: Payer: Self-pay

## 2020-08-02 DIAGNOSIS — E1142 Type 2 diabetes mellitus with diabetic polyneuropathy: Secondary | ICD-10-CM

## 2020-08-02 NOTE — Telephone Encounter (Signed)
Copied from [place] [id number] [id number]. Topic: General - Inquiry >> Aug 01, 2020  4:24 PM Gillis Ends D wrote: Reason for CRM: Patient would like a call back about her medication. She can be reached at [phone]. Please advise

## 2020-08-02 NOTE — Telephone Encounter (Signed)
Called patient back and she is in need of a refill for Jardiance 25mg .  She made a follow up appt for 08/07/20.

## 2020-08-03 MED ORDER — EMPAGLIFLOZIN 25 MG PO TABS: 25.0000 mg | ORAL_TABLET | Freq: Every day | ORAL | 0 refills | Status: DC

## 2020-08-03 NOTE — Telephone Encounter (Signed)
Rx sent to pharmacy on file.

## 2020-08-07 ENCOUNTER — Encounter: Payer: Self-pay | Admitting: Family Medicine

## 2020-08-07 ENCOUNTER — Other Ambulatory Visit: Payer: Self-pay

## 2020-08-07 ENCOUNTER — Ambulatory Visit (INDEPENDENT_AMBULATORY_CARE_PROVIDER_SITE_OTHER): Payer: 59 | Admitting: Family Medicine

## 2020-08-07 VITALS — BP 148/67 | HR 69 | Temp 98.3°F | Resp 17 | Ht 62.0 in | Wt 247.6 lb

## 2020-08-07 DIAGNOSIS — L600 Ingrowing nail: Secondary | ICD-10-CM

## 2020-08-07 DIAGNOSIS — E1142 Type 2 diabetes mellitus with diabetic polyneuropathy: Secondary | ICD-10-CM

## 2020-08-07 LAB — POCT GLYCOSYLATED HEMOGLOBIN (HGB A1C): Hemoglobin A1C: 7.4 % — AB (ref 4.0–5.6)

## 2020-08-07 MED ORDER — EMPAGLIFLOZIN 25 MG PO TABS: 25.0000 mg | ORAL_TABLET | Freq: Every day | ORAL | 0 refills | Status: DC

## 2020-08-07 MED ORDER — LANTUS SOLOSTAR 100 UNIT/ML ~~LOC~~ SOPN: PEN_INJECTOR | SUBCUTANEOUS | 0 refills | Status: DC

## 2020-08-07 MED ORDER — PEN NEEDLES 32G X 6 MM MISC: 1.0000 | Freq: Every day | 4 refills | Status: DC

## 2020-08-07 NOTE — Assessment & Plan Note (Signed)
Left ingrown toenail, has not established with podiatry in the past.  Will place referral, as higher risk for complications with history of T2DM.  Patient provided with contact information for [facility] in [facility] to call and make an appointment while we work on sending over referral.  Patient in agreement with plan.

## 2020-08-07 NOTE — Assessment & Plan Note (Signed)
ControlledDM with A1c 7.4% improved from 9.% on 11/11/2019 and goal A1c < 7.0%. - Complications - hyperglycemia and peripheral neuropathy  Plan:  1. Continue current therapy: Jardiance 25mg  daily and januvian 100mg  daily 2. Encourage improved lifestyle: - low carb/low glycemic diet reinforced prior education - Increase physical activity to 30 minutes most days of the week.  Explained that increased physical activity increases body's use of sugar for energy. 3. Check fasting am CBG and log these.  Bring log to next visit for review 4. Continue ACEi and Statin 5. Advised to schedule DM ophtho exam, send record. 6. Follow-up 3 months

## 2020-08-07 NOTE — Progress Notes (Signed)
Subjective:    Patient ID: Stacy Martinez, female    DOB: Dec 25, 1955, 64 y.o.   MRN: [id number]  Stacy Martinez is a 64 y.o. female presenting on 08/07/2020 for Diabetes and Toe Injury (pt was clipping her great toenail and injured the side of the toe , redness but no soreness  x 3 days ago )   HPI  Ms. Ugarte presents to clinic for a follow up on her diabetes and for concerns of a right great toe injury.  Reports that she has just taken her blood pressure medication within the past 20 minutes, which is why her BP is elevated.  States she had been clipping her toe nails yesterday and had clipped the skin around the toe nail, causing discomfort and had an ingrown toenail.  Redness and discomfort with pressure.  Has been wearing sandals/open toe shoes for comfort.  Diabetes Pt presents today for follow up Type 2 Diabetes Mellitus.  He/she (caps): She ACTION; IS/IS NOT: is not checking AM CBG at home. -Current diabetic medications include: Januvia 100mg  daily and Jardiance 25mg  daily -ACTION; IS/IS NOT: is not currently symptomatic -Actions; denies/reports/admits to: denies polydipsia, polyphagia, polyuria, headaches, diaphoresis, shakiness, chills, pain, numbness or tingling in extremities or changes in vision -Clinical course has been improving  -Reports no structured exercise routine -Diet is high in salt, high in fat, and high in carbohydrates  PREVENTION Eye exam current (within 1 year) Due, encouraged Foot exam current (within 1 year) Up to date Lipid/ASCVD risk reduction - on statin: YES/NO: Yes  Kidney Protection (On ACE/ARB)? YES/NO: Yes   Depression screen [facility] 2/9 04/23/2020 04/05/2020 11/11/2019  Decreased Interest 0 0 0  Down, Depressed, Hopeless 0 0 0  PHQ - 2 Score 0 0 0    Social History   Tobacco Use  . Smoking status: Never Smoker  . Smokeless tobacco: Never Used  Vaping Use  . Vaping Use: Never used  Substance Use Topics  . Alcohol use: No    Alcohol/week: 0.0 standard  drinks  . Drug use: No    Review of Systems  Constitutional: Negative.   HENT: Negative.   Eyes: Negative.   Respiratory: Negative.   Cardiovascular: Negative.   Gastrointestinal: Negative.   Endocrine: Negative.   Genitourinary: Negative.   Musculoskeletal: Negative.   Skin: Negative.        Left great toe, ingrown toenail  Allergic/Immunologic: Negative.   Neurological: Negative.   Hematological: Negative.   Psychiatric/Behavioral: Negative.    Per HPI unless specifically indicated above     Objective:    BP (!) 148/67 (BP Location: Right Arm, Patient Position: Sitting, Cuff Size: Large)   Pulse 69   Temp 98.3 F (36.8 C) (Oral)   Resp 17   Ht 5\' 2"  (1.575 m)   Wt 247 lb 9.6 oz (112.3 kg)   SpO2 97%   BMI 45.29 kg/m   Wt Readings from Last 3 Encounters:  08/07/20 247 lb 9.6 oz (112.3 kg)  07/10/20 240 lb 1.3 oz (108.9 kg)  06/19/20 240 lb (108.9 kg)    Physical Exam Vitals and nursing note reviewed.  Constitutional:      General: She is not in acute distress.    Appearance: Normal appearance. She is well-developed and well-groomed. She is not ill-appearing or toxic-appearing.  HENT:     Head: Normocephalic and atraumatic.     Nose:     Comments: Lizbeth Bark is in place, covering mouth and nose. Eyes:  General: Lids are normal. Vision grossly intact.        Right eye: No discharge.        Left eye: No discharge.     Extraocular Movements: Extraocular movements intact.     Conjunctiva/sclera: Conjunctivae normal.     Pupils: Pupils are equal, round, and reactive to light.  Cardiovascular:     Rate and Rhythm: Normal rate and regular rhythm.     Pulses: Normal pulses.          Dorsalis pedis pulses are 2+ on the right side and 2+ on the left side.     Heart sounds: Normal heart sounds. No murmur heard.  No friction rub. No gallop.   Pulmonary:     Effort: Pulmonary effort is normal. No respiratory distress.     Breath sounds: Normal breath sounds.   Musculoskeletal:     Right lower leg: No edema.     Left lower leg: No edema.       Feet:  Skin:    General: Skin is warm and dry.     Capillary Refill: Capillary refill takes less than 2 seconds.  Neurological:     General: No focal deficit present.     Mental Status: She is alert and oriented to person, place, and time.  Psychiatric:        Attention and Perception: Attention and perception normal.        Mood and Affect: Mood and affect normal.        Speech: Speech normal.        Behavior: Behavior normal. Behavior is cooperative.        Thought Content: Thought content normal.        Cognition and Memory: Cognition and memory normal.        Judgment: Judgment normal.    Results for orders placed or performed in visit on 08/07/20  POCT glycosylated hemoglobin (Hb A1C)  Result Value Ref Range   Hemoglobin A1C 7.4 (A) 4.0 - 5.6 %   HbA1c POC (<> result, manual entry)     HbA1c, POC (prediabetic range)     HbA1c, POC (controlled diabetic range)        Assessment & Plan:   Problem List Items Addressed This Visit      Endocrine   DM type 2 with diabetic peripheral neuropathy ([place]) - Primary    ControlledDM with A1c 7.4% improved from 9.% on 11/11/2019 and goal A1c < 7.0%. - Complications - hyperglycemia and peripheral neuropathy  Plan:  1. Continue current therapy: Jardiance 25mg  daily and januvian 100mg  daily 2. Encourage improved lifestyle: - low carb/low glycemic diet reinforced prior education - Increase physical activity to 30 minutes most days of the week.  Explained that increased physical activity increases body's use of sugar for energy. 3. Check fasting am CBG and log these.  Bring log to next visit for review 4. Continue ACEi and Statin 5. Advised to schedule DM ophtho exam, send record. 6. Follow-up 3 months      Relevant Medications   empagliflozin (JARDIANCE) 25 MG TABS tablet   Other Relevant Orders   POCT glycosylated hemoglobin (Hb A1C)  (Completed)     Musculoskeletal and Integument   Ingrown nail of great toe of left foot    Left ingrown toenail, has not established with podiatry in the past.  Will place referral, as higher risk for complications with history of T2DM.  Patient provided with contact information for [facility]  in [place] to call and make an appointment while we work on sending over referral.  Patient in agreement with plan.      Relevant Orders   Ambulatory referral to Podiatry      Meds ordered this encounter  Medications  . empagliflozin (JARDIANCE) 25 MG TABS tablet    Sig: Take 1 tablet (25 mg total) by mouth daily.    Dispense:  30 tablet    Refill:  0  . DISCONTD: insulin glargine (LANTUS SOLOSTAR) 100 UNIT/ML Solostar Pen    Sig: INJECT 32 UNITS SUBCUTANEOUSLY ONCE DAILY    Dispense:  30 mL    Refill:  0  . DISCONTD: Insulin Pen Needle (PEN NEEDLES) 32G X 6 MM MISC    Sig: 1 Device by Does not apply route daily.    Dispense:  100 each    Refill:  4    Follow up plan: Return in about 3 months (around 11/05/2020) for T2DM, and hypertension follow up.   Harlin Rain, [place] [facility] Nurse Practitioner [facility] 08/07/2020, 1:22 PM

## 2020-08-07 NOTE — Patient Instructions (Signed)
A referral to Podiatry has been placed today.  If you have not heard from the specialty office or our referral coordinator within 1 week, please let us know and we will follow up with the referral coordinator for an update.  Continue all medications as prescribed  You can learn more information online about your diabetes at American Diabetes Association: http://www.diabetes.org/ - General self-care (diet, medications, blood sugar checks). - Diet recommendations - There are even recipes available for you to look at and try.  Try to get exercise a minimum of 30 minutes per day at least 5 days per week as well as  adequate water intake all while measuring blood pressure a few times per week.  Keep a blood pressure log and bring back to clinic at your next visit.  If your readings are consistently over 130/80 to contact our office/send me a MyChart message and we will see you sooner.  Can try DASH and Mediterranean diet options, avoiding processed foods, lowering sodium intake, avoiding pork products, and eating a plant based diet for optimal health.  We will plan to see you back in 3 months for diabetes follow up visit  You will receive a survey after today's visit either digitally by e-mail or paper by [place] mail. Your experiences and feedback matter to [place].  Please respond so we know how we are doing as we provide care for you.  Call us with any questions/concerns/needs.  It is my goal to be available to you for your health concerns.  Thanks for choosing me to be a partner in your healthcare needs!  Harlin Rain, FNP-C Family Nurse Practitioner [facility] Phone: [phone]

## 2020-08-15 ENCOUNTER — Other Ambulatory Visit: Payer: Self-pay | Admitting: Orthopedic Surgery

## 2020-08-15 ENCOUNTER — Ambulatory Visit: Payer: 59 | Attending: Orthopedic Surgery

## 2020-08-15 DIAGNOSIS — M1712 Unilateral primary osteoarthritis, left knee: Secondary | ICD-10-CM

## 2020-08-20 ENCOUNTER — Other Ambulatory Visit: Payer: Self-pay

## 2020-08-20 ENCOUNTER — Other Ambulatory Visit
Admission: RE | Admit: 2020-08-20 | Discharge: 2020-08-20 | Disposition: A | Discharge status: Home / Self Care | Payer: 59 | Source: Ambulatory Visit | Attending: Orthopedic Surgery | Admitting: Orthopedic Surgery

## 2020-08-20 NOTE — Patient Instructions (Addendum)
Your procedure is scheduled on: Monday, December 27 Report to the Registration Desk on the 1st floor of the [facility]. To find out your arrival time, please call [phone] between 1PM - 3PM on: Thursday, December 23  REMEMBER: Instructions that are not followed completely may result in serious medical risk, up to and including death; or upon the discretion of your surgeon and anesthesiologist your surgery may need to be rescheduled.  Do not eat food after midnight the night before surgery.  No gum chewing, lozengers or hard candies.  You may however, drink water up to 2 hours before you are scheduled to arrive for your surgery. Do not drink anything within 2 hours of your scheduled arrival time.  TAKE THESE MEDICATIONS THE MORNING OF SURGERY WITH A SIP OF WATER:  1.  Amlodipine 2.  Carvedilol   Do not take any insulin on the morning of surgery. And only take 1/2 of your Lantus the night before surgery.  One week prior to surgery: Stop Anti-inflammatories (NSAIDS) such as Advil, Aleve, Ibuprofen, Motrin, Naproxen, Naprosyn and Aspirin based products such as Excedrin, Goodys Powder, BC Powder. Stop ANY OVER THE COUNTER supplements until after surgery.  No Alcohol for 24 hours before or after surgery.  No Smoking including e-cigarettes for 24 hours prior to surgery.  No chewable tobacco products for at least 6 hours prior to surgery.  No nicotine patches on the day of surgery.  Do not use any "recreational" drugs for at least a week prior to your surgery.  Please be advised that the combination of cocaine and anesthesia may have negative outcomes, up to and including death. If you test positive for cocaine, your surgery will be cancelled.  On the morning of surgery brush your teeth with toothpaste and water, you may rinse your mouth with mouthwash if you wish. Do not swallow any toothpaste or mouthwash.  Do not wear jewelry, make-up, hairpins, clips or nail polish.  Do not  wear lotions, powders, or perfumes.   Do not shave body from the neck down 48 hours prior to surgery just in case you cut yourself which could leave a site for infection.  Also, freshly shaved skin may become irritated if using the CHG soap.  Contact lenses, hearing aids and dentures may not be worn into surgery.  Do not bring valuables to the hospital. [facility] is not responsible for any missing/lost belongings or valuables.   Use CHG Soap as directed on instruction sheet.  Notify your doctor if there is any change in your medical condition (cold, fever, infection).  Wear comfortable clothing (specific to your surgery type) to the hospital.  Plan for stool softeners for home use; pain medications have a tendency to cause constipation. You can also help prevent constipation by eating foods high in fiber such as fruits and vegetables and drinking plenty of fluids as your diet allows.  After surgery, you can help prevent lung complications by doing breathing exercises.  Take deep breaths and cough every 1-2 hours. Your doctor may order a device called an Incentive Spirometer to help you take deep breaths. When coughing or sneezing, hold a pillow firmly against your incision with both hands. This is called "splinting." Doing this helps protect your incision. It also decreases belly discomfort.  If you are being discharged the day of surgery, you will not be allowed to drive home. You will need a responsible adult (18 years or older) to drive you home and stay with you  that night.   If you are taking public transportation, you will need to have a responsible adult (18 years or older) with you. Please confirm with your physician that it is acceptable to use public transportation.   Please call the [place] [facility] at [phone] if you have any questions about these instructions.  Visitation Policy:  Patients undergoing a surgery or procedure may have one family member or  support person with them as long as that person is not COVID-19 positive or experiencing its symptoms.  That person may remain in the waiting area during the procedure.

## 2020-08-21 ENCOUNTER — Encounter
Admission: RE | Admit: 2020-08-21 | Discharge: 2020-08-21 | Disposition: A | Discharge status: Home / Self Care | Payer: 59 | Source: Ambulatory Visit | Attending: Orthopedic Surgery | Admitting: Orthopedic Surgery

## 2020-08-21 DIAGNOSIS — Z01818 Encounter for other preprocedural examination: Secondary | ICD-10-CM | POA: Insufficient documentation

## 2020-08-21 DIAGNOSIS — I447 Left bundle-branch block, unspecified: Secondary | ICD-10-CM | POA: Insufficient documentation

## 2020-08-21 LAB — COMPREHENSIVE METABOLIC PANEL
ALT: 17 U/L (ref 0–44)
AST: 17 U/L (ref 15–41)
Albumin: 4 g/dL (ref 3.5–5.0)
Alkaline Phosphatase: 100 U/L (ref 38–126)
Anion gap: 10 (ref 5–15)
BUN: 18 mg/dL (ref 8–23)
CO2: 26 mmol/L (ref 22–32)
Calcium: 9.1 mg/dL (ref 8.9–10.3)
Chloride: 105 mmol/L (ref 98–111)
Creatinine, Ser: 0.67 mg/dL (ref 0.44–1.00)
GFR, Estimated: >60 mL/min (ref >60)
Glucose, Bld: 141 mg/dL — ABNORMAL HIGH (ref 70–99)
Potassium: 4 mmol/L (ref 3.5–5.1)
Sodium: 141 mmol/L (ref 135–145)
Total Bilirubin: 0.6 mg/dL (ref 0.3–1.2)
Total Protein: 7.9 g/dL (ref 6.5–8.1)

## 2020-08-21 LAB — CBC WITH DIFFERENTIAL/PLATELET
Abs Immature Granulocytes: 0.04 10*3/uL (ref 0.00–0.07)
Basophils Absolute: 0.1 10*3/uL (ref 0.0–0.1)
Basophils Relative: 1 %
Eosinophils Absolute: 0.8 10*3/uL — ABNORMAL HIGH (ref 0.0–0.5)
Eosinophils Relative: 8 %
HCT: 40.7 % (ref 36.0–46.0)
Hemoglobin: 12.8 g/dL (ref 12.0–15.0)
Immature Granulocytes: 0 %
Lymphocytes Relative: 21 %
Lymphs Abs: 2.2 10*3/uL (ref 0.7–4.0)
MCH: 26.2 pg (ref 26.0–34.0)
MCHC: 31.4 g/dL (ref 30.0–36.0)
MCV: 83.2 fL (ref 80.0–100.0)
Monocytes Absolute: 0.7 10*3/uL (ref 0.1–1.0)
Monocytes Relative: 6 %
Neutro Abs: 6.9 10*3/uL (ref 1.7–7.7)
Neutrophils Relative %: 64 %
Platelets: 481 10*3/uL — ABNORMAL HIGH (ref 150–400)
RBC: 4.89 MIL/uL (ref 3.87–5.11)
RDW: 14.6 % (ref 11.5–15.5)
WBC: 10.7 10*3/uL — ABNORMAL HIGH (ref 4.0–10.5)
nRBC: 0 % (ref 0.0–0.2)

## 2020-08-21 LAB — URINALYSIS, ROUTINE W REFLEX MICROSCOPIC
Bacteria, UA: NONE SEEN
Bilirubin Urine: NEGATIVE
Glucose, UA: >=500 mg/dL — AB
Hgb urine dipstick: NEGATIVE
Ketones, ur: NEGATIVE mg/dL
Leukocytes,Ua: NEGATIVE
Nitrite: NEGATIVE
Protein, ur: NEGATIVE mg/dL
Specific Gravity, Urine: 1.035 — ABNORMAL HIGH (ref 1.005–1.030)
pH: 5 (ref 5.0–8.0)

## 2020-08-21 LAB — SURGICAL PCR SCREEN
MRSA, PCR: NEGATIVE
Staphylococcus aureus: NEGATIVE

## 2020-08-21 LAB — TYPE AND SCREEN
ABO/RH(D): O POS
Antibody Screen: NEGATIVE

## 2020-08-22 ENCOUNTER — Ambulatory Visit
Admission: RE | Admit: 2020-08-22 | Discharge: 2020-08-22 | Disposition: A | Discharge status: Home / Self Care | Payer: 59 | Source: Ambulatory Visit | Attending: Orthopedic Surgery | Admitting: Orthopedic Surgery

## 2020-08-22 ENCOUNTER — Other Ambulatory Visit: Payer: Self-pay

## 2020-08-22 DIAGNOSIS — M1712 Unilateral primary osteoarthritis, left knee: Secondary | ICD-10-CM | POA: Insufficient documentation

## 2020-08-23 ENCOUNTER — Other Ambulatory Visit
Admission: RE | Admit: 2020-08-23 | Discharge: 2020-08-23 | Disposition: A | Discharge status: Home / Self Care | Payer: 59 | Source: Ambulatory Visit | Attending: Orthopedic Surgery | Admitting: Orthopedic Surgery

## 2020-08-23 DIAGNOSIS — Z01812 Encounter for preprocedural laboratory examination: Secondary | ICD-10-CM | POA: Insufficient documentation

## 2020-08-23 DIAGNOSIS — Z20822 Contact with and (suspected) exposure to covid-19: Secondary | ICD-10-CM | POA: Insufficient documentation

## 2020-08-23 LAB — SARS CORONAVIRUS 2 (TAT 6-24 HRS): SARS Coronavirus 2: NEGATIVE

## 2020-08-27 ENCOUNTER — Inpatient Hospital Stay: Payer: 59 | Admitting: Certified Registered"

## 2020-08-27 ENCOUNTER — Inpatient Hospital Stay: Payer: 59

## 2020-08-27 ENCOUNTER — Ambulatory Visit
Admission: RE | Admit: 2020-08-27 | Discharge: 2020-08-27 | Disposition: A | Discharge status: Home / Self Care | Payer: 59 | Attending: Orthopedic Surgery | Admitting: Orthopedic Surgery

## 2020-08-27 ENCOUNTER — Encounter
Admission: RE | Disposition: A | Discharge status: Home / Self Care | Payer: Self-pay | Source: Home / Self Care | Attending: Orthopedic Surgery

## 2020-08-27 ENCOUNTER — Other Ambulatory Visit: Payer: Self-pay

## 2020-08-27 DIAGNOSIS — Z888 Allergy status to other drugs, medicaments and biological substances status: Secondary | ICD-10-CM | POA: Diagnosis not present

## 2020-08-27 DIAGNOSIS — M25511 Pain in right shoulder: Secondary | ICD-10-CM

## 2020-08-27 DIAGNOSIS — Z79899 Other long term (current) drug therapy: Secondary | ICD-10-CM | POA: Insufficient documentation

## 2020-08-27 DIAGNOSIS — G8929 Other chronic pain: Secondary | ICD-10-CM

## 2020-08-27 DIAGNOSIS — Z833 Family history of diabetes mellitus: Secondary | ICD-10-CM | POA: Diagnosis not present

## 2020-08-27 DIAGNOSIS — M25711 Osteophyte, right shoulder: Secondary | ICD-10-CM | POA: Insufficient documentation

## 2020-08-27 DIAGNOSIS — Z7984 Long term (current) use of oral hypoglycemic drugs: Secondary | ICD-10-CM | POA: Insufficient documentation

## 2020-08-27 DIAGNOSIS — Z96611 Presence of right artificial shoulder joint: Secondary | ICD-10-CM

## 2020-08-27 DIAGNOSIS — M19011 Primary osteoarthritis, right shoulder: Secondary | ICD-10-CM | POA: Diagnosis not present

## 2020-08-27 DIAGNOSIS — M75121 Complete rotator cuff tear or rupture of right shoulder, not specified as traumatic: Secondary | ICD-10-CM | POA: Insufficient documentation

## 2020-08-27 DIAGNOSIS — M7521 Bicipital tendinitis, right shoulder: Secondary | ICD-10-CM | POA: Insufficient documentation

## 2020-08-27 DIAGNOSIS — M129 Arthropathy, unspecified: Secondary | ICD-10-CM | POA: Diagnosis present

## 2020-08-27 HISTORY — PX: REVERSE SHOULDER ARTHROPLASTY: SHX5054

## 2020-08-27 LAB — ABO/RH: ABO/RH(D): O POS

## 2020-08-27 LAB — GLUCOSE, CAPILLARY
Glucose-Capillary: 159 mg/dL — ABNORMAL HIGH (ref 70–99)
Glucose-Capillary: 153 mg/dL — ABNORMAL HIGH (ref 70–99)

## 2020-08-27 SURGERY — ARTHROPLASTY, SHOULDER, TOTAL, REVERSE
Anesthesia: Regional | Site: Shoulder | Laterality: Right

## 2020-08-27 MED ORDER — OXYCODONE HCL 5 MG PO TABS: 5.0000 mg | ORAL_TABLET | ORAL | 0 refills | Status: DC | PRN

## 2020-08-27 MED ORDER — ACETAMINOPHEN 500 MG PO TABS: 1000.0000 mg | ORAL_TABLET | Freq: Three times a day (TID) | ORAL | 2 refills | Status: DC

## 2020-08-27 MED ORDER — CEFAZOLIN SODIUM-DEXTROSE 2-4 GM/100ML-% IV SOLN
INTRAVENOUS | Status: AC
  Filled 2020-08-27: qty 100

## 2020-08-27 MED ORDER — BUPIVACAINE LIPOSOME 1.3 % IJ SUSP
INTRAMUSCULAR | Status: AC
  Filled 2020-08-27: qty 20

## 2020-08-27 MED ORDER — TRANEXAMIC ACID-NACL 1000-0.7 MG/100ML-% IV SOLN
INTRAVENOUS | Status: AC
  Administered 2020-08-27: 1000 mg via INTRAVENOUS
  Filled 2020-08-27: qty 100

## 2020-08-27 MED ORDER — CEFAZOLIN SODIUM-DEXTROSE 2-4 GM/100ML-% IV SOLN
2.0000 g | Freq: Once | INTRAVENOUS | Status: AC
  Administered 2020-08-27: 2 g via INTRAVENOUS

## 2020-08-27 MED ORDER — ONDANSETRON HCL 4 MG/2ML IJ SOLN: 4.0000 mg | Freq: Once | INTRAMUSCULAR | Status: DC | PRN

## 2020-08-27 MED ORDER — BUPIVACAINE HCL (PF) 0.5 % IJ SOLN
INTRAMUSCULAR | Status: DC | PRN
  Administered 2020-08-27: 10 mL via PERINEURAL

## 2020-08-27 MED ORDER — PHENYLEPHRINE HCL (PRESSORS) 10 MG/ML IV SOLN: INTRAVENOUS | Status: DC | PRN

## 2020-08-27 MED ORDER — TRANEXAMIC ACID-NACL 1000-0.7 MG/100ML-% IV SOLN: 1000.0000 mg | INTRAVENOUS | Status: AC

## 2020-08-27 MED ORDER — ASPIRIN EC 325 MG PO TBEC: 325.0000 mg | DELAYED_RELEASE_TABLET | Freq: Every day | ORAL | 0 refills | Status: DC

## 2020-08-27 MED ORDER — ONDANSETRON 4 MG PO TBDP: 4.0000 mg | ORAL_TABLET | Freq: Three times a day (TID) | ORAL | 0 refills | Status: DC | PRN

## 2020-08-27 MED ORDER — NEOMYCIN-POLYMYXIN B GU 40-200000 IR SOLN
Status: DC | PRN
  Administered 2020-08-27: 4 mL
  Administered 2020-08-27: 12 mL

## 2020-08-27 MED ORDER — LIDOCAINE HCL (PF) 1 % IJ SOLN
INTRAMUSCULAR | Status: AC
  Filled 2020-08-27: qty 5

## 2020-08-27 MED ORDER — BUPIVACAINE LIPOSOME 1.3 % IJ SUSP
INTRAMUSCULAR | Status: DC | PRN
  Administered 2020-08-27: 20 mL via PERINEURAL

## 2020-08-27 MED ORDER — FENTANYL CITRATE (PF) 100 MCG/2ML IJ SOLN: 50.0000 ug | Freq: Once | INTRAMUSCULAR | Status: AC

## 2020-08-27 MED ORDER — LACTATED RINGERS IV SOLN
INTRAVENOUS | Status: DC | PRN
Start: 2020-08-27 — End: 2020-08-27

## 2020-08-27 MED ORDER — CEFAZOLIN SODIUM-DEXTROSE 2-4 GM/100ML-% IV SOLN
2.0000 g | INTRAVENOUS | Status: AC
  Administered 2020-08-27: 2 g via INTRAVENOUS

## 2020-08-27 MED ORDER — ROCURONIUM BROMIDE 10 MG/ML (PF) SYRINGE
PREFILLED_SYRINGE | INTRAVENOUS | Status: AC
  Filled 2020-08-27: qty 10

## 2020-08-27 MED ORDER — FAMOTIDINE 20 MG PO TABS
ORAL_TABLET | ORAL | Status: AC
  Administered 2020-08-27: 20 mg via ORAL
  Filled 2020-08-27: qty 1

## 2020-08-27 MED ORDER — TRANEXAMIC ACID 1000 MG/10ML IV SOLN
INTRAVENOUS | Status: AC
  Filled 2020-08-27: qty 10

## 2020-08-27 MED ORDER — ORAL CARE MOUTH RINSE: 15.0000 mL | Freq: Once | OROMUCOSAL | Status: AC

## 2020-08-27 MED ORDER — DEXAMETHASONE SODIUM PHOSPHATE 10 MG/ML IJ SOLN
INTRAMUSCULAR | Status: AC
  Filled 2020-08-27: qty 1

## 2020-08-27 MED ORDER — MIDAZOLAM HCL 2 MG/2ML IJ SOLN
INTRAMUSCULAR | Status: AC
  Administered 2020-08-27: 1 mg via INTRAVENOUS
  Filled 2020-08-27: qty 2

## 2020-08-27 MED ORDER — MIDAZOLAM HCL 2 MG/2ML IJ SOLN
INTRAMUSCULAR | Status: AC
  Filled 2020-08-27: qty 2

## 2020-08-27 MED ORDER — FENTANYL CITRATE (PF) 100 MCG/2ML IJ SOLN
INTRAMUSCULAR | Status: AC
  Filled 2020-08-27: qty 2

## 2020-08-27 MED ORDER — OXYCODONE HCL 5 MG PO TABS
5.0000 mg | ORAL_TABLET | ORAL | 0 refills | Status: DC | PRN
Start: 2020-08-27 — End: 2020-12-13

## 2020-08-27 MED ORDER — PROPOFOL 10 MG/ML IV BOLUS
INTRAVENOUS | Status: AC
  Filled 2020-08-27: qty 20

## 2020-08-27 MED ORDER — NEOMYCIN-POLYMYXIN B GU 40-200000 IR SOLN
Status: AC
  Filled 2020-08-27: qty 20

## 2020-08-27 MED ORDER — VANCOMYCIN HCL 1000 MG IV SOLR
INTRAVENOUS | Status: DC | PRN
  Administered 2020-08-27: 1000 mg via TOPICAL

## 2020-08-27 MED ORDER — EPHEDRINE SULFATE 50 MG/ML IJ SOLN
INTRAMUSCULAR | Status: DC | PRN
  Administered 2020-08-27 (×3): 10 mg via INTRAVENOUS

## 2020-08-27 MED ORDER — SUGAMMADEX SODIUM 200 MG/2ML IV SOLN
INTRAVENOUS | Status: DC | PRN
  Administered 2020-08-27: 200 mg via INTRAVENOUS

## 2020-08-27 MED ORDER — PROPOFOL 10 MG/ML IV BOLUS
INTRAVENOUS | Status: DC | PRN
  Administered 2020-08-27: 180 mg via INTRAVENOUS

## 2020-08-27 MED ORDER — BUPIVACAINE LIPOSOME 1.3 % IJ SUSP: INTRAMUSCULAR | Status: DC | PRN

## 2020-08-27 MED ORDER — PHENYLEPHRINE HCL (PRESSORS) 10 MG/ML IV SOLN
INTRAVENOUS | Status: AC
  Filled 2020-08-27: qty 1

## 2020-08-27 MED ORDER — BUPIVACAINE HCL (PF) 0.5 % IJ SOLN
INTRAMUSCULAR | Status: AC
  Filled 2020-08-27: qty 10

## 2020-08-27 MED ORDER — BUPIVACAINE-EPINEPHRINE (PF) 0.25% -1:200000 IJ SOLN
INTRAMUSCULAR | Status: AC
  Filled 2020-08-27: qty 30

## 2020-08-27 MED ORDER — SODIUM CHLORIDE 0.9 % IV SOLN
INTRAVENOUS | Status: DC | PRN
  Administered 2020-08-27: 100 ug via INTRAVENOUS
  Administered 2020-08-27: 200 ug via INTRAVENOUS
  Administered 2020-08-27: 100 ug via INTRAVENOUS
  Administered 2020-08-27: 200 ug via INTRAVENOUS
  Administered 2020-08-27: 100 ug via INTRAVENOUS

## 2020-08-27 MED ORDER — VANCOMYCIN HCL 1000 MG IV SOLR
INTRAVENOUS | Status: AC
  Filled 2020-08-27: qty 1000

## 2020-08-27 MED ORDER — LIDOCAINE HCL (PF) 1 % IJ SOLN
INTRAMUSCULAR | Status: DC | PRN
  Administered 2020-08-27: .5 mL

## 2020-08-27 MED ORDER — ONDANSETRON HCL 4 MG/2ML IJ SOLN
INTRAMUSCULAR | Status: AC
  Filled 2020-08-27: qty 2

## 2020-08-27 MED ORDER — SODIUM CHLORIDE 0.9 % IV SOLN: INTRAVENOUS | Status: DC

## 2020-08-27 MED ORDER — MIDAZOLAM HCL 2 MG/2ML IJ SOLN: 1.0000 mg | Freq: Once | INTRAMUSCULAR | Status: AC

## 2020-08-27 MED ORDER — EPINEPHRINE PF 1 MG/ML IJ SOLN
INTRAMUSCULAR | Status: AC
  Filled 2020-08-27: qty 1

## 2020-08-27 MED ORDER — FENTANYL CITRATE (PF) 100 MCG/2ML IJ SOLN
INTRAMUSCULAR | Status: AC
  Administered 2020-08-27: 50 ug via INTRAVENOUS
  Filled 2020-08-27: qty 2

## 2020-08-27 MED ORDER — CHLORHEXIDINE GLUCONATE 0.12 % MT SOLN: 15.0000 mL | Freq: Once | OROMUCOSAL | Status: AC

## 2020-08-27 MED ORDER — ROCURONIUM BROMIDE 100 MG/10ML IV SOLN
INTRAVENOUS | Status: DC | PRN
  Administered 2020-08-27: 30 mg via INTRAVENOUS
  Administered 2020-08-27: 50 mg via INTRAVENOUS
  Administered 2020-08-27: 20 mg via INTRAVENOUS

## 2020-08-27 MED ORDER — PHENYLEPHRINE HCL-NACL 20-0.9 MG/250ML-% IV SOLN
INTRAVENOUS | Status: DC | PRN
  Administered 2020-08-27: 50 ug/min via INTRAVENOUS

## 2020-08-27 MED ORDER — LIDOCAINE HCL (CARDIAC) PF 100 MG/5ML IV SOSY
PREFILLED_SYRINGE | INTRAVENOUS | Status: DC | PRN
  Administered 2020-08-27: 100 mg via INTRAVENOUS

## 2020-08-27 MED ORDER — ONDANSETRON HCL 4 MG/2ML IJ SOLN
INTRAMUSCULAR | Status: DC | PRN
  Administered 2020-08-27: 4 mg via INTRAVENOUS

## 2020-08-27 MED ORDER — ASPIRIN EC 325 MG PO TBEC: 325.0000 mg | DELAYED_RELEASE_TABLET | Freq: Every day | ORAL | 0 refills | Status: AC

## 2020-08-27 MED ORDER — FAMOTIDINE 20 MG PO TABS: 20.0000 mg | ORAL_TABLET | Freq: Once | ORAL | Status: AC

## 2020-08-27 MED ORDER — FENTANYL CITRATE (PF) 100 MCG/2ML IJ SOLN: 25.0000 ug | INTRAMUSCULAR | Status: DC | PRN

## 2020-08-27 MED ORDER — TRANEXAMIC ACID-NACL 1000-0.7 MG/100ML-% IV SOLN
INTRAVENOUS | Status: DC | PRN
  Administered 2020-08-27: 1000 mg via INTRAVENOUS

## 2020-08-27 MED ORDER — CHLORHEXIDINE GLUCONATE 0.12 % MT SOLN
OROMUCOSAL | Status: AC
  Administered 2020-08-27: 15 mL via OROMUCOSAL
  Filled 2020-08-27: qty 15

## 2020-08-27 MED ORDER — DEXAMETHASONE SODIUM PHOSPHATE 10 MG/ML IJ SOLN
INTRAMUSCULAR | Status: DC | PRN
  Administered 2020-08-27: 8 mg via INTRAVENOUS

## 2020-08-27 SURGICAL SUPPLY — 86 items
ADH SKN CLS APL DERMABOND .7 (GAUZE/BANDAGES/DRESSINGS) ×1
APL PRP STRL LF DISP 70% ISPRP (MISCELLANEOUS) ×1
AUG BASEPLATE 15DEG 25 WEDGE (Joint) ×2 IMPLANT
AUGMENT BASEPLATE 15DEG 25 WDG (Joint) ×1 IMPLANT
BIT DRILL 3.2 PERIPHERAL SCREW (BIT) ×2 IMPLANT
BLADE SAGITTAL WIDE XTHICK NO (BLADE) ×2 IMPLANT
BSPLAT GLND 15 PRFT LNG POST (Screw) ×1 IMPLANT
BSPLAT GLND 15D 25 FULL WDG (Joint) ×1 IMPLANT
CANISTER SUCT 1200ML W/VALVE (MISCELLANEOUS) ×2 IMPLANT
CANISTER SUCT 3000ML PPV (MISCELLANEOUS) ×4 IMPLANT
CHLORAPREP W/TINT 26 (MISCELLANEOUS) ×2 IMPLANT
CNTNR SPEC 2.5X3XGRAD LEK (MISCELLANEOUS) ×1
CONT SPEC 4OZ STER OR WHT (MISCELLANEOUS) ×1
CONT SPEC 4OZ STRL OR WHT (MISCELLANEOUS) ×1
CONTAINER SPEC 2.5X3XGRAD LEK (MISCELLANEOUS) ×1 IMPLANT
COOLER POLAR GLACIER W/PUMP (MISCELLANEOUS) ×2 IMPLANT
COVER BACK TABLE REUSABLE LG (DRAPES) ×2 IMPLANT
COVER WAND RF STERILE (DRAPES) ×2 IMPLANT
DERMABOND ADVANCED (GAUZE/BANDAGES/DRESSINGS) ×1
DERMABOND ADVANCED .7 DNX12 (GAUZE/BANDAGES/DRESSINGS) ×1 IMPLANT
DRAPE 3/4 80X56 (DRAPES) ×4 IMPLANT
DRAPE IMP U-DRAPE 54X76 (DRAPES) ×4 IMPLANT
DRAPE INCISE IOBAN 66X45 STRL (DRAPES) ×4 IMPLANT
DRAPE U-SHAPE 47X51 STRL (DRAPES) ×2 IMPLANT
DRSG OPSITE POSTOP 3X4 (GAUZE/BANDAGES/DRESSINGS) ×2 IMPLANT
DRSG OPSITE POSTOP 4X6 (GAUZE/BANDAGES/DRESSINGS) ×2 IMPLANT
DRSG OPSITE POSTOP 4X8 (GAUZE/BANDAGES/DRESSINGS) ×2 IMPLANT
DRSG TEGADERM 2-3/8X2-3/4 SM (GAUZE/BANDAGES/DRESSINGS) ×2 IMPLANT
DRSG TEGADERM 4X10 (GAUZE/BANDAGES/DRESSINGS) ×6 IMPLANT
ELECT REM PT RETURN 9FT ADLT (ELECTROSURGICAL) ×2
ELECTRODE REM PT RTRN 9FT ADLT (ELECTROSURGICAL) ×1 IMPLANT
GAUZE 4X4 16PLY RFD (DISPOSABLE) ×2 IMPLANT
GAUZE XEROFORM 1X8 LF (GAUZE/BANDAGES/DRESSINGS) ×2 IMPLANT
GLENOSPHERE REV SHOULDER 36 (Joint) ×2 IMPLANT
GLOVE BIOGEL PI IND STRL 8 (GLOVE) ×2 IMPLANT
GLOVE BIOGEL PI INDICATOR 8 (GLOVE) ×2
GLOVE SURG ORTHO 8.0 STRL STRW (GLOVE) ×4 IMPLANT
GLOVE SURG SYN 8.0 (GLOVE) ×2 IMPLANT
GOWN STRL REUS W/ TWL LRG LVL3 (GOWN DISPOSABLE) ×2 IMPLANT
GOWN STRL REUS W/ TWL XL LVL3 (GOWN DISPOSABLE) ×1 IMPLANT
GOWN STRL REUS W/TWL LRG LVL3 (GOWN DISPOSABLE) ×4
GOWN STRL REUS W/TWL XL LVL3 (GOWN DISPOSABLE) ×2
GUIDE GLENOID PATIENT REVERSED (MISCELLANEOUS) ×2 IMPLANT
GUIDEWIRE GLENOID 2.5X220 (WIRE) ×2 IMPLANT
HEMOVAC 400CC 10FR (MISCELLANEOUS) ×2 IMPLANT
HOOD PEEL AWAY FLYTE STAYCOOL (MISCELLANEOUS) ×6 IMPLANT
IMPL REVERSE SHOULDER 0X3.5 (Shoulder) ×1 IMPLANT
IMPLANT REVERSE SHOULDER 0X3.5 (Shoulder) ×2 IMPLANT
INSERT HUMERAL 36X6MM 12.5DEG (Insert) ×2 IMPLANT
KIT STABILIZATION SHOULDER (MISCELLANEOUS) ×2 IMPLANT
MANIFOLD NEPTUNE II (INSTRUMENTS) ×2 IMPLANT
MASK FACE SPIDER DISP (MASK) ×2 IMPLANT
MAT ABSORB  FLUID 56X50 GRAY (MISCELLANEOUS) ×1
MAT ABSORB FLUID 56X50 GRAY (MISCELLANEOUS) ×1 IMPLANT
NEEDLE REVERSE CUT 1/2 CRC (NEEDLE) ×2 IMPLANT
NEEDLE SPNL 20GX3.5 QUINCKE YW (NEEDLE) ×2 IMPLANT
NS IRRIG 1000ML POUR BTL (IV SOLUTION) ×2 IMPLANT
PACK ARTHROSCOPY SHOULDER (MISCELLANEOUS) ×2 IMPLANT
PAD WRAPON POLAR SHDR XLG (MISCELLANEOUS) ×1 IMPLANT
PASSER SUT SWANSON 36MM LOOP (INSTRUMENTS) IMPLANT
PENCIL SMOKE ULTRAEVAC 22 CON (MISCELLANEOUS) ×2 IMPLANT
PULSAVAC PLUS IRRIG FAN TIP (DISPOSABLE) ×2
RETRIEVER SUT HEWSON (MISCELLANEOUS) ×2 IMPLANT
SCREW 5.0X18 (Screw) ×4 IMPLANT
SCREW BONE SMALL REVERSED 15 (Screw) ×2 IMPLANT
SCREW BONE THREAD 6.5X35 (Screw) ×2 IMPLANT
SCREW PERIPHERAL 5.0X34 (Screw) ×4 IMPLANT
SLING ULTRA II LG (MISCELLANEOUS) ×2 IMPLANT
SLING ULTRA II M (MISCELLANEOUS) ×2 IMPLANT
SPONGE LAP 18X18 RF (DISPOSABLE) ×4 IMPLANT
STAPLER SKIN PROX 35W (STAPLE) IMPLANT
STEM HUMERAL SZ4B STD 78 PTC (Stem) ×2 IMPLANT
STRAP SAFETY 5IN WIDE (MISCELLANEOUS) ×4 IMPLANT
STRIP CLOSURE SKIN 1/2X4 (GAUZE/BANDAGES/DRESSINGS) ×2 IMPLANT
SUT FIBERWIRE #2 38 BLUE 1/2 (SUTURE) ×8
SUT MNCRL AB 4-0 PS2 18 (SUTURE) ×4 IMPLANT
SUT PROLENE 6 0 P 1 18 (SUTURE) ×2 IMPLANT
SUT TICRON 2-0 30IN 311381 (SUTURE) ×6 IMPLANT
SUT VIC AB 0 CT1 36 (SUTURE) ×2 IMPLANT
SUT VIC AB 2-0 CT2 27 (SUTURE) ×4 IMPLANT
SUTURE FIBERWR #2 38 BLUE 1/2 (SUTURE) ×4 IMPLANT
SYR 20ML LL LF (SYRINGE) ×2 IMPLANT
SYR 30ML LL (SYRINGE) ×4 IMPLANT
TIP FAN IRRIG PULSAVAC PLUS (DISPOSABLE) ×1 IMPLANT
TRAY FOLEY SLVR 16FR LF STAT (SET/KITS/TRAYS/PACK) ×2 IMPLANT
WRAPON POLAR PAD SHDR XLG (MISCELLANEOUS) ×2

## 2020-08-27 NOTE — Anesthesia Preprocedure Evaluation (Signed)
Anesthesia Evaluation  Patient identified by MRN, date of birth, ID band Patient awake    Reviewed: Allergy & Precautions, NPO status , Patient's Chart, lab work & pertinent test results  History of Anesthesia Complications Negative for: history of anesthetic complications  Airway Mallampati: III       Dental   Pulmonary neg sleep apnea, neg COPD, Not current smoker,           Cardiovascular hypertension, Pt. on medications (-) Past MI and (-) CHF (-) dysrhythmias (-) Valvular Problems/Murmurs     Neuro/Psych neg Seizures    GI/Hepatic Neg liver ROS, neg GERD  ,  Endo/Other  diabetes, Type 2, Oral Hypoglycemic Agents, Insulin Dependent  Renal/GU negative Renal ROS     Musculoskeletal   Abdominal   Peds  Hematology   Anesthesia Other Findings   Reproductive/Obstetrics                             Anesthesia Physical Anesthesia Plan  ASA: III  Anesthesia Plan: General   Post-op Pain Management: GA combined w/ Regional for post-op pain   Induction: Intravenous  PONV Risk Score and Plan:   Airway Management Planned: Oral ETT  Additional Equipment:   Intra-op Plan:   Post-operative Plan:   Informed Consent: I have reviewed the patients History and Physical, chart, labs and discussed the procedure including the risks, benefits and alternatives for the proposed anesthesia with the patient or authorized representative who has indicated his/her understanding and acceptance.       Plan Discussed with:   Anesthesia Plan Comments:         Anesthesia Quick Evaluation

## 2020-08-27 NOTE — Op Note (Signed)
Operative Note    SURGERY DATE: 08/27/2020   PRE-OP DIAGNOSIS:  1. Right shoulder rotator cuff arthropathy   POST-OP DIAGNOSIS:  1. Right shoulder rotator cuff arthropathy  PROCEDURES:  1. Right reverse total shoulder arthroplasty 2. Right biceps tenodesis   SURGEON: Cato Mulligan, MD   ASSISTANT: Raquel James, PA  ANESTHESIA: Gen + interscalene block   ESTIMATED BLOOD LOSS: 200cc   TOTAL IV FLUIDS: See anesthesia record  IMPLANTS: Tornier: Perform plus reversed full wedge augment baseplate 15 degree with press-fit long central post; superior compression screw, posterior locking screw, inferior locking screw, and anterior locking screw to glenoid baseplate; 36 mm standard glenosphere; size 4B standard humeral stem; standard reversed tray; standard reversed insert +6 mm   INDICATION(S):  Stacy Martinez is a 64 y.o. female with chronic shoulder pain and stiffness.  Imaging consistent with rotator cuff arthropathy and significant humeral head elevation with end-stage glenohumeral arthritis.  Conservative measures including medications and cortisone injections have not provided adequate relief. After discussion of risks, benefits, and alternatives to surgery, the patient elected to proceed with reverse shoulder arthroplasty and biceps tenodesis.   OPERATIVE FINDINGS: Severe glenohumeral osteoarthritis, massive rotator cuff tear, biceps tendinopathy   OPERATIVE REPORT:   I identified Stacy Martinez in the pre-operative holding area. Informed consent was obtained and the surgical site was marked. I reviewed the risks and benefits of the proposed surgical intervention and the patient (and/or patient's guardian) wished to proceed. An interscalene block with Exparel was administered by the Anesthesia team. The patient was transferred to the operative suite and general anesthesia was administered. The patient was placed in the beach chair position with the head of the bed elevated approximately  45 degrees. All down side pressure points were appropriately padded. Pre-op exam under anesthesia confirmed some stiffness and crepitus. Appropriate IV antibiotics were administered. Tranexamic acid was also administered after verifying that the patient had no contraindications. The extremity was then prepped and draped in standard fashion. A time out was performed confirming the correct extremity, correct patient, and correct procedure.   We used the standard deltopectoral incision from the coracoid to ~12cm distal. We found the cephalic vein and took it laterally.  We opened the deltopectoral interval widely and placed retractors under the CA ligament in the subacromial space and under the deltoid tendon at its insertion. We then abducted and internally rotated the arm and released the underlying bursa between these retractors, taking care not to damage the circumflex branch of the axillary nerve.   Next, we brought the arm back in adduction at slight forward flexion with external rotation. We opened the clavipectoral fascia lateral to the conjoint tendon. We gently palpated the axillary nerve and verified its position and continuity on both sides of the humerus with a Tug test. Note, this test was repeated multiple times during the procedure for nerve localization and confirmed to be intact at the end of the case. We then cauterized the anterior humeral circumflex ("Three sisters") vessels. The arm was then internally rotated, we cut the falciform ligament at approximately 1 cm of the upper portion of the pectoralis major insertion. Next we unroofed the bicipital groove. We proceeded with a soft tissue biceps tenodesis given the pathology of the tendon.  After opening the biceps tendon sheath all the way to the supraglenoid tubercle, we performed a biceps tenodesis with two #2 TiCron sutures to the upper border of the pectoralis major. The proximal portion of the tendon was  excised.   Next, we performed a  subscapularis peel using Bovie electrocautery. We released the inferior capsule from the humerus all the way to the posterior band of the inferior glenohumeral ligament. When this was complete we gently dislocated the shoulder up into the wound. We removed any osteophytes and made our cut with the appropriate inclination in 30 degrees of retroversion. The humeral canal was sequentially broached to the appropriate size.    We then turned our attention to the glenoid. The proximal humerus was retracted posteriorly. The anterior capsule was dissected free from the subscapularis. The anterior capsule was then excised, exposing the anterior glenoid. We then grasped the labrum and removed it circumferentially. During the glenoid exposure, the axillary nerve was protected the entire time.  At this time, the cephalic vein was noted to be nicked, and therefore, it was cauterized.  A patient-specific guide was used to drill the central guidepin.  The full wedge trial was placed and maximum thickness of the wedge was at the 11 o'clock position consistent with preoperative templating. Standard reaming and instrumentation was performed and wedge trial had excellent fit.  I attempted to place the baseplate with 6.5 mm central screw, but this would not seat with good fixation.  Therefore, it was removed and a extended press-fit central post was drilled and placed.  This allowed for excellent fixation of the baseplate to the glenoid with appropriate seating. Next the superior compression screw was drilled and placed.  The anterior, inferior, and posterior locking screws were then placed.  The peripheral reamer was used to ensure appropriate glenosphere seating.  Next, the glenosphere was impacted into place, and central screw of the glenosphere was tightened.  We then turned our attention back to the humerus.  The trial poly was placed and the humerus was reduced.  The shoulder was trialed and noted to have satisfactory  stability, motion, and deltoid tension with a standard +6 mm poly.  Trial implants were removed.  The wound was thoroughly irrigated.  3 drill holes about the bicipital tuberosity were placed for final subscapularis repair and FiberWire sutures were passed through these holes.  The final humeral component with humeral tray and insert was then placed at 30 degrees of retroversion. The humerus was reduced and final confirmation of motion, tension, and stability were satisfactory. A Hemovac drain was placed.  The arm was placed in a neutral position.  Subscapularis was repaired using previously passed #2 FiberWire sutures.  We again verified the tension on the axillary nerve, appropriate range of motion, stability of the implant, and security of the subscapularis repair. We closed the deltopectoral interval a running, 0-Vicryl suture. The skin was closed with 2-0 Vicryl and staples. Xeroform and Honeycomb dressing was applied. A PolarCare unit and sling were placed. Patient was extubated, transferred to a stretcher bed and to the post antesthesia care unit in stable condition.   Of note, assistance from a PA was essential to performing the surgery.  PA was present for the entire surgery.  PA assisted with patient positioning, retraction, instrumentation, and wound closure. The surgery would have been more difficult and had longer operative time without PA assistance.    POSTOPERATIVE PLAN: The patient will be admitted with plan for discharge home on POD#1. Operative arm to remain in sling at all times except RoM exercises and hygiene. Can perform pendulums, elbow/wrist/hand RoM exercises. Passive RoM allowed to 90 FF and 30 ER. ASA 325mg  x 6 weeks for DVT ppx. Plan for outpatient  PT starting on POD #3-4. Patient to return to clinic in ~2 weeks for post-operative appointment.

## 2020-08-27 NOTE — Evaluation (Signed)
Occupational Therapy Evaluation Patient Details Name: Stacy Martinez MRN: [id number] DOB: 1955-12-06 Today's Date: 08/27/2020    History of Present Illness D Rohal is a 64 y.o. female with chronic shoulder pain and stiffness.  Imaging consistent with rotator cuff arthropathy and significant humeral head elevation with end-stage glenohumeral arthritis.  Conservative measures including medications and cortisone injections have not provided adequate relief. After discussion of risks, benefits, and alternatives to surgery, the patient elected to proceed with reverse shoulder arthroplasty and biceps tenodesis. Pt is s/p R total shoulder.   Clinical Impression   Patient is s/p day zero R total shoulder. Pt presenting with decreased sensation in R UE, decreased balance, decreased knowledge of precautions, sling use, and digits/wrist/elbow exercises.  Patient reports being independent PTA and living at home with several family members having 24/7 assist/supervision as needed. OT educated pt on use of polar care, donning/doffing of sling and proper use/ and dressing techniques with shoulder precautions/NWB. Pt verbalized understanding and demonstrating with assistance. Paper handouts provided as well. Pt dressing with max A for UB and min - mod A for LB self care. Pt needing min guard for ambulation to toilet without use of AD. Pt managed clothing and hygiene with min guard for balance and min cuing for precautions. Patient will benefit from acute OT to increase overall independence in the areas of ADLs, functional mobility, and safety awareness in order to safely discharge home with family.    Follow Up Recommendations  Follow surgeon's recommendation for DC plan and follow-up therapies;Supervision - Intermittent    Equipment Recommendations  None recommended by OT       Precautions / Restrictions Precautions Precautions: Shoulder Shoulder Interventions: Shoulder sling/immobilizer;Shoulder abduction  pillow;At all times Restrictions Weight Bearing Restrictions: Yes RUE Weight Bearing: Non weight bearing      Mobility Bed Mobility               General bed mobility comments: seated in recliner chair    Transfers Overall transfer level: Needs assistance Equipment used: 1 person hand held assist Transfers: Sit to/from Stand Sit to Stand: Min guard              Balance Overall balance assessment: Needs assistance Sitting-balance support: Feet supported Sitting balance-Leahy Scale: Good     Standing balance support: During functional activity Standing balance-Leahy Scale: Fair Standing balance comment: min guard for safety                           ADL either performed or assessed with clinical judgement   ADL Overall ADL's : Needs assistance/impaired Eating/Feeding: Set up;Supervision/ safety;Sitting   Grooming: Wash/dry hands;Wash/dry face;Set up;Oral care           Upper Body Dressing : Maximal assistance;Sitting   Lower Body Dressing: Minimal assistance;Sit to/from stand   Toilet Transfer: Min guard   [place] and Hygiene: Min guard;Sit to/from stand               Vision Patient Visual Report: No change from baseline              Pertinent Vitals/Pain Pain Assessment: Faces Faces Pain Scale: No hurt     Hand Dominance Right   Extremity/Trunk Assessment Upper Extremity Assessment Upper Extremity Assessment: RUE deficits/detail RUE Deficits / Details: total shoulder - decreased sensation from nerve block           Communication Communication Communication: No difficulties   Cognition  Arousal/Alertness: Awake/alert Behavior During Therapy: WFL for tasks assessed/performed Overall Cognitive Status: Within Functional Limits for tasks assessed                                                Home Living Family/patient expects to be discharged to:: Private residence Living  Arrangements: Children Available Help at Discharge: Family;Available 24 hours/day Type of Home: House       Home Layout: One level     Bathroom Shower/Tub: Chief Strategy Officer: Standard     Home Equipment: None          Prior Functioning/Environment Level of Independence: Independent                 OT Problem List: Decreased strength;Decreased range of motion;Decreased activity tolerance;Decreased safety awareness;Impaired balance (sitting and/or standing);Decreased knowledge of use of DME or AE;Decreased knowledge of precautions;Impaired sensation;Decreased coordination      OT Treatment/Interventions: Self-care/ADL training;Balance training;Therapeutic exercise;Therapeutic activities;Energy conservation;DME and/or AE instruction;Visual/perceptual remediation/compensation;Patient/family education    OT Goals(Current goals can be found in the care plan section) Acute Rehab OT Goals Patient Stated Goal: none stated OT Goal Formulation: With patient Time For Goal Achievement: 09/10/20 Potential to Achieve Goals: Fair ADL Goals Pt Will Perform Grooming: with modified independence;standing Pt Will Perform Upper Body Bathing: with supervision;sitting Pt Will Perform Upper Body Dressing: with supervision;sitting Pt Will Perform Toileting - Clothing Manipulation and hygiene: with supervision;sit to/from stand Pt Will Perform Tub/Shower Transfer: with supervision;ambulating Pt/caregiver will Perform Home Exercise Program: Right Upper extremity;With written HEP provided;Independently  OT Frequency: Min 1X/week   Barriers to D/C:    none known at this time          AM-PAC OT "6 Clicks" Daily Activity     Outcome Measure Help from another person eating meals?: A Little Help from another person taking care of personal grooming?: A Little Help from another person toileting, which includes using toliet, bedpan, or urinal?: A Little Help from another person  bathing (including washing, rinsing, drying)?: A Little Help from another person to put on and taking off regular upper body clothing?: A Lot Help from another person to put on and taking off regular lower body clothing?: A Little 6 Click Score: 17   End of Session Equipment Utilized During Treatment: Other (comment) (shoulder sling and polar care) Nurse Communication: Mobility status;Precautions  Activity Tolerance: Patient tolerated treatment well Patient left: with call bell/phone within reach;in chair  OT Visit Diagnosis: Muscle weakness (generalized) (M62.81)                Time: 4163-8453 OT Time Calculation (min): 36 min Charges:  OT General Charges $OT Visit: 1 Visit OT Evaluation $OT Eval Low Complexity: 1 Low OT Treatments $Self Care/Home Management : 23-37 mins  Jackquline Denmark, MS, OTR/L , CBIS ascom [phone]  08/27/20, 2:20 PM

## 2020-08-27 NOTE — Discharge Instructions (Signed)
Stacy Mulligan, MD  [facility]  Phone: [phone]  Fax: [phone]   Discharge Instructions after Reverse Shoulder Replacement   1. Activity/Sling: You are to be non-weight bearing on operative extremity. A sling/shoulder immobilizer has been provided for you. Only remove the sling to perform elbow, wrist, and hand RoM exercises and hygiene/dressing. Active reaching and lifting are not permitted. You will be given further instructions on sling use at your first physical therapy visit and postoperative visit with Dr. Posey Pronto.   2. Dressings: Dressing may be removed at 1st physical therapy visit (~3-4 days after surgery). Afterwards, you may either leave open to air (if no drainage) or cover with dry, sterile dressing. If you have steri-strips on your wound, please do not remove them. They will fall off on their own. You may shower 5 days after surgery. Please pat incision dry. Do not rub or place any shear forces across incision. If there is drainage or any opening of incision after 5 days, please notify our offices immediately.   3. Driving:  Plan on not driving for six weeks. Please note that you are advised NOT to drive while taking narcotic pain medications as you may be impaired and unsafe to drive.  4. Medications:  - You have been provided a prescription for narcotic pain medicine (usually oxycodone). After surgery, take 1-2 narcotic tablets every 4 hours if needed for severe pain. Please start this as soon as you begin to start having pain (if you received a nerve block, start taking as soon as this wears off).  - A prescription for anti-nausea medication will be provided in case the narcotic medicine causes nausea - take 1 tablet every 6 hours only if nauseated.  - Take enteric coated aspirin 325 mg once daily for 6 weeks to prevent blood clots. Do not take aspirin if you have an aspirin sensitivity/allergy or asthma or are on an anticoagulant (blood thinner) already. If so, then  your home anticoagulant will be resume and managed - do not take aspirin. -Take tylenol 1000mg  (2 Extra strength or 3 regular strength tablets) every 8 hours for pain. This will reduce the amount of narcotic medication needed. May stop tylenol when you are having minimal pain. - Take a stool softener (Colace, Dulcolax or Senakot) if you are using narcotic pain medications to help with constipation that is associated with narcotic use. - DO NOT take ANY nonsteroidal anti-inflammatory pain medications: Advil, Motrin, Ibuprofen, Aleve, Naproxen, or Naprosyn.  If you are taking prescription medication for anxiety, depression, insomnia, muscle spasm, chronic pain, or for attention deficit disorder you are advised that you are at a higher risk of adverse effects with use of narcotics post-op, including narcotic addiction/dependence, depressed breathing, death. If you use non-prescribed substances: alcohol, marijuana, cocaine, heroin, methamphetamines, etc., you are at a higher risk of adverse effects with use of narcotics post-op, including narcotic addiction/dependence, depressed breathing, death. You are advised that taking > 50 morphine milligram equivalents (MME) of narcotic pain medication per day results in twice the risk of overdose or death. For your prescription provided: oxycodone 5 mg - taking more than 6 tablets per day after the first few days of surgery.  5. Physical Therapy: 1-2 times per week for ~12 weeks. Therapy typically starts on post operative Day 3 or 4. You have been provided an order for physical therapy. The therapist will provide home exercises. Please contact our offices if this appointment has not been scheduled.   6. Work: May do  light duty/desk job in approximately 2 weeks when off of narcotics, pain is well-controlled, and swelling has decreased if able to function with one arm in sling. Full work may take 6 weeks if light motions and function of both arms is required.  Lifting jobs may require 12 weeks.  7. Post-Op Appointments: Your first post-op appointment will be with Dr. Posey Pronto in approximately 2 weeks time.   If you find that they have not been scheduled please call the Orthopaedic Appointment front desk at [phone].      Stacy Mulligan, MD [facility] Phone: [phone] Fax: [phone]   REVERSE SHOULDER ARTHROPLASTY REHAB GUIDELINES   These guidelines should be tailored to individual patients based on their rehab goals, age, precautions, quality of repair, etc.  Progression should be based on patient progress and approval by the referring physician.  PHASE 1 - Day 1 through Week 2  GENERAL GUIDELINES AND PRECAUTIONS . Sling wear 24/7 except during grooming and home exercises (3 to 5 times daily) . Avoid shoulder extension such that the arm is posterior the frontal plane.  When patients recline, a pillow should be placed behind the upper arm and sling should be on.  They should be advised to always be able to see the elbow . Avoid combined IR/ADD/EXT, such as hand behind back to prevent dislocation . Avoid combined IR and ADD such as reaching across the chest to prevent dislocation . No AROM . No submersion in pool/water for 4 weeks . No weight bearing through operative arm (as in transfers, walker use, etc.)  GOALS . Maintain integrity of joint replacement; protect soft tissue healing . Increase PROM for elevation to 120 and ER to 30 (will remain the goal for first 6 weeks) . Optimize distal UE circulation and muscle activity (elbow, wrist and hand) . Instruct in use of sling for proper fit, polar care device for ice application after HEP, signs/symptoms of infection  EXERCISES . Active elbow, wrist and hand . Passive forward elevation in scapular plane to 90-120 max motion; ER in scapular plane to 30 . Active scapular retraction with arms resting in neutral position  CRITERIA TO PROGRESS TO PHASE 2 . Low pain (less  than 3/10) with shoulder PROM . Healing of incision without signs of infection . Clearance by MD to advance after 2 week MD check up  PHASE 2 - 2 weeks - 6 weeks  GENERAL GUIDELINES AND PRECAUTIONS . Sling may be removed while at home; worn in community without abduction pillow . May use arm for light activities of daily living (such as feeding, brushing teeth, dressing.) with elbow near  the side of the body  and arm in front of the body- no active lifting of the arm . May submerge in water (tub, pool, [place], etc.) after 4 weeks . Continue to avoid WBing through the operative arm . Continue to avoid combined IR/EXT/ADD (hand behind the back) and IR/ADD  (reaching across chest) for dislocation precautions  GOALS .  Achieve passive elevation to 120 and ER to 30 .  Low (less than 3/10) to no pain .  Ability to fire all heads of the deltoid  EXERCISES . May discontinue grip, and active elbow and wrist exercises since using the arm in ADL's  with sling removed around the home . Continue passive elevation to 120 and ER to 30, both in scapular plane with arm supported on table top . Add submaximal isometrics, pain free effort, for all functional heads of  deltoid (anterior, posterior, middle)  Ensure that with posterior deltoid isometric the shoulder does not move into extension and the arm remains anterior the frontal plane . At 4 weeks:  begin to place arm in balanced position of 90 deg elevation in supine; when patient able to hold this position with ease, may begin reverse pendulums clockwise and counterclockwise  CRITERIA TO PROGRESS TO PHASE 3 . Passive forward elevation in scapular plane to 120; passive ER in scapular plane to 30 . Ability to fire isometrically all heads of the deltoid muscle without pain . Ability to place and hold the arm in balanced position (90 deg elevation in supine)  PHASE 3 - 6 weeks to 3 months  GENERAL GUIDELINES AND PRECAUTIONS . Discontinue use of  sling . Avoid forcing end range motion in any direction to prevent dislocation  . May advance use of the arm actively in ADL's without being restricted to arm by the side of the body, however, avoid heavy lifting and sports (forever!) . May initiate functional IR behind the back gently . NO UPPER BODY ERGOMETER   GOALS . Optimize PROM for elevation and ER in scapular plane with realistic expectation that max  mobility for elevation is usually around 145-160 passively; ER 40 to 50 passively; functional IR to L1 . Recover AROM to approach as close to PROM available as possible; may expect 135-150 deg active elevation; 30 deg active ER; active functional IR to L1 . Establish dynamic stability of the shoulder with deltoid and periscapular muscle gradual strengthening  EXERCISES . Forward elevation in scapular plane active progression: supine to incline, to vertical; short to long lever arm . Balanced position long lever arm AROM . Active ER/IR with arm at side . Scapular retraction with light band resistance . Functional IR with hand slide up back - very gentle and gradual . NO UPPER BODY ERGOMETER     CRITERIA TO PROGRESS TO PHASE 4 .  AROM equals/approaches PROM with good mechanics for elevation  .  No pain .  Higher level demand on shoulder than ADL functions   PHASE 4 12 months and beyond  [place] . No heavy lifting and no overhead sports . No heavy pushing activity . Gradually increase strength of deltoid and scapular stabilizers; also the rotator cuff if present with weights not to exceed 5 lbs . NO UPPER BODY ERGOMETER   GOALS .  Optimize functional use of the operative UE to meet the desired demands .  Gradual increase in deltoid, scapular muscle, and rotator cuff strength .  Pain free functional activities   EXERCISES . Add light hand weights for deltoid up to and not to exceed 3 lbs for anterior and posterior with long arm lift against  gravity; elbow bent to 90 deg for abduction in scapular plane . Theraband progression for extension to hip with scapular depression/retraction . Theraband progression for serratus anterior punches in supine; avoid wall, incline or prone pressups for serratus anterior . End range stretching gently without forceful overpressure in all planes (elevation in scapular plane, ER in scapular plane, functional IR) with stretching done for life as part of a daily routine . NO UPPER BODY ERGOMETER     CRITERIA FOR DISCHARGE FROM SKILLED PHYSICAL THERAPY .  Pain free AROM for shoulder elevation (expect around 135-150) .  Functional strength for all ADL's, work tasks, and hobbies approved by Psychologist, sport and exercise .  Independence with home maintenance program   NOTES: 1. With proper exercise,  motion, strength, and function continue to improve even after one year. 2. The complication rate after surgery is 5 - 8%. Complications include infection, fracture, heterotopic bone formation, nerve injury, instability, rotator cuff tear, and tuberosity nonunion. Please look for clinical signs, unusual symptoms, or lack of progress with therapy and report those to Dr. Posey Pronto. Prefer more communication than less.  3. The therapy plan above only serves as a guide. Please be aware of specific individualized patient instructions as written on the prescription or through discussions with the surgeon. 4. Please call Dr. Posey Pronto if you have any specific questions or concerns [phone]  AMBULATORY SURGERY  DISCHARGE INSTRUCTIONS   1) The drugs that you were given will stay in your system until tomorrow so for the next 24 hours you should not:  A) Drive an automobile B) Make any legal decisions C) Drink any alcoholic beverage   2) You may resume regular meals tomorrow.  Today it is better to start with liquids and gradually work up to solid foods.  You may eat anything you prefer, but it is better to start with liquids, then soup  and crackers, and gradually work up to solid foods.   3) Please notify your doctor immediately if you have any unusual bleeding, trouble breathing, redness and pain at the surgery site, drainage, fever, or pain not relieved by medication.    4) Additional Instructions:   Please contact your physician with any problems or Same Day Surgery at [phone], Monday through Friday 6 am to 4 pm, or [place] at [facility] number at [phone].     Interscalene Nerve Block with Exparel  1.  For your surgery you have received an Interscalene Nerve Block with Exparel. 2. Nerve Blocks affect many types of nerves, including nerves that control movement, pain and normal sensation.  You may experience feelings such as numbness, tingling, heaviness, weakness or the inability to move your arm or the feeling or sensation that your arm has "fallen asleep". 3. A nerve block with Exparel can last up to 5 days.  Usually the weakness wears off first.  The tingling and heaviness usually wear off next.  Finally you may start to notice pain.  Keep in mind that this may occur in any order.  Once a nerve block starts to wear off it is usually completely gone within 60 minutes. 4. ISNB may cause mild shortness of breath, a hoarse voice, blurry vision, unequal pupils, or drooping of the face on the same side as the nerve block.  These symptoms will usually resolve with the numbness.  Very rarely the procedure itself can cause mild seizures. 5. If needed, your surgeon will give you a prescription for pain medication.  It will take about 60 minutes for the oral pain medication to become fully effective.  So, it is recommended that you start taking this medication before the nerve block first begins to wear off, or when you first begin to feel discomfort. 6. Take your pain medication only as prescribed.  Pain medication can cause sedation and decrease your breathing if you take more than you need for the level of pain  that you have. 7. Nausea is a common side effect of many pain medications.  You may want to eat something before taking your pain medicine to prevent nausea. 8. After an Interscalene nerve block, you cannot feel pain, pressure or extremes in temperature in the effected arm.  Because your arm is numb it is at an increased  risk for injury.  To decrease the possibility of injury, please practice the following:  a. While you are awake change the position of your arm frequently to prevent too much pressure on any one area for prolonged periods of time. b.  If you have a cast or tight dressing, check the color or your fingers every couple of hours.  Call your surgeon with the appearance of any discoloration (white or blue). c. If you are given a sling to wear before you go home, please wear it  at all times until the block has completely worn off.  Do not get up at night without your sling. d. Please contact [facility] or your surgeon if you do not begin to regain sensation after 7 days from the surgery.  Anesthesia may be contacted by calling the Same Day Surgery Department, Mon. through Fri., 6 am to 4 pm at [phone].   e. If you experience any other problems or concerns, please contact your surgeon's office. f. If you experience severe or prolonged shortness of breath go to the nearest emergency department.

## 2020-08-27 NOTE — OR Nursing (Signed)
OT here to work with patient.  Ambulated to BR Voided QS

## 2020-08-27 NOTE — Anesthesia Procedure Notes (Signed)
Anesthesia Regional Block: Interscalene brachial plexus block   Pre-Anesthetic Checklist: ,, timeout performed, Correct Patient, Correct Site, Correct Laterality, Correct Procedure, Correct Position, site marked, Risks and benefits discussed,  Surgical consent,  Pre-op evaluation,  At surgeon's request and post-op pain management  Laterality: Right  Prep: chloraprep       Needles:  Injection technique: Single-shot  Needle Type: Echogenic Stimulator Needle     Needle Length: 10cm  Needle Gauge: 22     Additional Needles:   Procedures:,,,, ultrasound used (permanent image in chart),,,,   Nerve Stimulator or Paresthesia:  Response: biceps, 0.8 mA,   Additional Responses:   Narrative:  Start time: 08/27/2020 7:33 AM End time: 08/27/2020 7:38 AM  Performed by: Personally  Anesthesiologist: Yevette Edwards, MD  Additional Notes: Pt. Identified and accepting of procedure after risks and benefits fully reviewed and questions answered. Time out performed and laterality confirmed prior to procedure.  ISNB  performed without difficulty and well tolerated.  Neg IV and SATD.  No pain on injection of Local anesthetic and VSST.

## 2020-08-27 NOTE — Anesthesia Postprocedure Evaluation (Signed)
Anesthesia Post Note  Patient: Stacy Martinez  Procedure(s) Performed: RIGHT REVERSE SHOULDER ARTHROPLASTY,BICEPS TENODESIS (Right Shoulder)  Patient location during evaluation: PACU Anesthesia Type: Regional Level of consciousness: awake and alert Pain management: pain level controlled Vital Signs Assessment: post-procedure vital signs reviewed and stable Respiratory status: spontaneous breathing and respiratory function stable Cardiovascular status: stable Anesthetic complications: no   No complications documented.   Last Vitals:  Vitals:   08/27/20 0738 08/27/20 1132  BP: 96/73   Pulse: 65   Resp: 11   Temp:  36.6 C  SpO2: 97%     Last Pain:  Vitals:   08/27/20 1132  TempSrc:   PainSc: 0-No pain                 Brahim Dolman K

## 2020-08-27 NOTE — Evaluation (Signed)
Physical Therapy Evaluation Patient Details Name: Stacy Martinez MRN: [id number] DOB: 03-07-1956 Today's Date: 08/27/2020   History of Present Illness  Pt is a 64 yo female diagnosed with right shoulder rotator cuff arthropathy and is s/p elective right reverse TSA and biceps tenodesis. PMH includes: HTN and DM II.    Clinical Impression  Pt was pleasant and motivated to participate during the session and overall performed well throughout.  Pt was steady with transfers as well as with ambulation both with and without a SPC.  Pt was able to ascend 4 steps with alternating pattern with good concentric control and was able to descend 4 steps with step-to pattern with fair eccentric control.  Pt did present with significant SOB after amb that took 60 to 90 sec to resolve upon returning to sitting with SpO2 91-92% and HR in the low 100s, nursing notified.  Pt will benefit from PT services per surgeon's recommendation upon discharge to safely address deficits listed in patient problem list for decreased caregiver assistance and eventual return to PLOF.      Follow Up Recommendations Follow surgeon's recommendation for DC plan and follow-up therapies;Supervision - Intermittent (Per chart review OPPT set up for POD#3)    Equipment Recommendations  None recommended by PT    Recommendations for Other Services       Precautions / Restrictions Precautions Precautions: Shoulder Shoulder Interventions: Shoulder sling/immobilizer;Shoulder abduction pillow;At all times Restrictions Weight Bearing Restrictions: Yes RUE Weight Bearing: Non weight bearing      Mobility  Bed Mobility               General bed mobility comments: NT, in recliner with no bed in room    Transfers Overall transfer level: Needs assistance Equipment used: None;Straight cane Transfers: Sit to/from Stand Sit to Stand: Min guard         General transfer comment: Good eccentric and concentric control and  stability  Ambulation/Gait Ambulation/Gait assistance: Min guard Gait Distance (Feet): 60 Feet x 1 with SPC, 20 Feet x 1 without an AD Assistive device: None;Straight cane Gait Pattern/deviations: Decreased step length - right;Step-through pattern;Decreased step length - left Gait velocity: decreased   General Gait Details: Pt steady with amb with slow cadence both with a SPC and without an AD; mod verbal cues for scanning environment to avoid obstacles with the RUE; mod SOB after amb with SpO2 91-92% and HR in the low 100s, nursing notified.  Stairs Stairs: Yes Stairs assistance: Min guard Stair Management: One rail Left;Alternating pattern;Step to pattern Number of Stairs: 4 General stair comments: Alt pattern ascending and step-to pattern descending with good concentric control and fair eccentric control  Wheelchair Mobility    Modified Rankin (Stroke Patients Only)       Balance Overall balance assessment: Needs assistance Sitting-balance support: Feet supported Sitting balance-Leahy Scale: Good     Standing balance support: During functional activity;Single extremity supported Standing balance-Leahy Scale: Good Standing balance comment: min guard for safety                             Pertinent Vitals/Pain Pain Assessment: No/denies pain Faces Pain Scale: No hurt    Home Living Family/patient expects to be discharged to:: Private residence Living Arrangements: Children Available Help at Discharge: Family;Available 24 hours/day Type of Home: House Home Access: Stairs to enter Entrance Stairs-Rails: Right;Left;Can reach both Entrance Stairs-Number of Steps: 4 Home Layout: One level Home Equipment: None  Additional Comments: Pt lives with 2 sons and one of their fiances and will have 24/7 assistance.    Prior Function Level of Independence: Independent         Comments: Ind amb community distances without an AD, Ind with ADLs     Hand  Dominance   Dominant Hand: Right    Extremity/Trunk Assessment   Upper Extremity Assessment Upper Extremity Assessment: Defer to OT evaluation RUE Deficits / Details: total shoulder - decreased sensation from nerve block    Lower Extremity Assessment Lower Extremity Assessment: Overall WFL for tasks assessed       Communication   Communication: No difficulties  Cognition Arousal/Alertness: Awake/alert Behavior During Therapy: WFL for tasks assessed/performed Overall Cognitive Status: Within Functional Limits for tasks assessed                                        General Comments      Exercises Total Joint Exercises Ankle Circles/Pumps: AROM;Strengthening;Both;10 reps Quad Sets: Strengthening;Both;10 reps Gluteal Sets: Strengthening;Both;10 reps Long Arc Quad: Strengthening;Both;10 reps Other Exercises Other Exercises: Positioning education in sitting with thin pillow behind RUE to prevent shoulder extension and pillow under arm for support and decreased neck strain Other Exercises: Car transfer verbal education for proper sequencing Other Exercises: Review of importance of scanning environment during various functional tasks including amb and car transfers to protect from bumping RUE on obstacles Other Exercises: HEP education for APs, QS, GS, and hourly walks short walks to a max RPE of 4-6.   Assessment/Plan    PT Assessment Patient needs continued PT services  PT Problem List Decreased strength;Decreased range of motion;Decreased activity tolerance;Decreased balance;Decreased mobility;Decreased knowledge of use of DME;Decreased knowledge of precautions       PT Treatment Interventions Gait training;DME instruction;Stair training;Functional mobility training;Therapeutic activities;Therapeutic exercise;Balance training;Patient/family education    PT Goals (Current goals can be found in the Care Plan section)  Acute Rehab PT Goals Patient Stated  Goal: To get home and sleep in my own bed PT Goal Formulation: With patient Time For Goal Achievement: 09/09/20 Potential to Achieve Goals: Good    Frequency BID   Barriers to discharge        Co-evaluation               AM-PAC PT "6 Clicks" Mobility  Outcome Measure Help needed turning from your back to your side while in a flat bed without using bedrails?: A Little Help needed moving from lying on your back to sitting on the side of a flat bed without using bedrails?: A Little Help needed moving to and from a bed to a chair (including a wheelchair)?: A Little Help needed standing up from a chair using your arms (e.g., wheelchair or bedside chair)?: A Little Help needed to walk in hospital room?: A Little Help needed climbing 3-5 steps with a railing? : A Little 6 Click Score: 18    End of Session Equipment Utilized During Treatment: Gait belt;Other (comment) (RUE sling) Activity Tolerance: Patient tolerated treatment well Patient left: in chair;with nursing/sitter in room Nurse Communication: Mobility status;Other (comment) (Pt with mod to max SOB after limited amb with SpO2/HR per above) PT Visit Diagnosis: Difficulty in walking, not elsewhere classified (R26.2);Muscle weakness (generalized) (M62.81)    Time: [id number] PT Time Calculation (min) (ACUTE ONLY): 29 min   Charges:   PT Evaluation $PT Eval Moderate Complexity:  1 Mod PT Treatments $Gait Training: 8-22 mins $Therapeutic Activity: 8-22 mins        D. Elly Modena PT, DPT 08/27/20, 3:53 PM

## 2020-08-27 NOTE — H&P (Signed)
Paper H&P to be scanned into permanent record. H&P reviewed. No significant changes noted.  

## 2020-08-27 NOTE — Transfer of Care (Signed)
Immediate Anesthesia Transfer of Care Note  Patient: Stacy Martinez  Procedure(s) Performed: RIGHT REVERSE SHOULDER ARTHROPLASTY,BICEPS TENODESIS (Right Shoulder)  Patient Location: PACU  Anesthesia Type:General and Regional  Level of Consciousness: awake, alert  and oriented  Airway & Oxygen Therapy: Patient Spontanous Breathing and Patient connected to face mask oxygen  Post-op Assessment: Report given to RN and Post -op Vital signs reviewed and stable  Post vital signs: Reviewed and stable  Last Vitals:  Vitals Value Taken Time  BP 117/56 08/27/20 1132  Temp    Pulse 70 08/27/20 1133  Resp 15 08/27/20 1133  SpO2 99 % 08/27/20 1133  Vitals shown include unvalidated device data.  Last Pain:  Vitals:   08/27/20 0738  TempSrc:   PainSc: 0-No pain         Complications: No complications documented.

## 2020-08-27 NOTE — Anesthesia Procedure Notes (Signed)
Procedure Name: Intubation Date/Time: 08/27/2020 7:57 AM Performed by: Rona Ravens, CRNA Pre-anesthesia Checklist: Patient identified, Emergency Drugs available, Suction available, Patient being monitored and Timeout performed Patient Re-evaluated:Patient Re-evaluated prior to induction Oxygen Delivery Method: Circle system utilized Preoxygenation: Pre-oxygenation with 100% oxygen Induction Type: IV induction Ventilation: Mask ventilation without difficulty Laryngoscope Size: Mac and 3 Grade View: Grade II Tube type: Oral Tube size: 7.0 mm Number of attempts: 1 Airway Equipment and Method: Stylet Placement Confirmation: ETT inserted through vocal cords under direct vision,  positive ETCO2 and breath sounds checked- equal and bilateral Secured at: 22 cm Tube secured with: Tape Dental Injury: Teeth and Oropharynx as per pre-operative assessment

## 2020-08-28 ENCOUNTER — Other Ambulatory Visit: Payer: 59

## 2020-08-28 ENCOUNTER — Encounter: Payer: Self-pay | Admitting: Orthopedic Surgery

## 2020-08-29 ENCOUNTER — Other Ambulatory Visit: Payer: Self-pay | Admitting: Family Medicine

## 2020-08-29 DIAGNOSIS — E1142 Type 2 diabetes mellitus with diabetic polyneuropathy: Secondary | ICD-10-CM

## 2020-08-29 LAB — SURGICAL PATHOLOGY

## 2020-08-29 NOTE — Telephone Encounter (Signed)
Requested Prescriptions  Pending Prescriptions Disp Refills   gabapentin (NEURONTIN) 100 MG capsule [Pharmacy Med Name: Gabapentin 100 MG Oral Capsule] 90 capsule 2    Sig: Take 1 capsule by mouth at bedtime     Neurology: Anticonvulsants - gabapentin Passed - 08/29/2020 11:49 AM      Passed - Valid encounter within last 12 months    Recent Outpatient Visits          3 weeks ago DM type 2 with diabetic peripheral neuropathy [facility])   [facility], Jodelle Gross, FNP   3 months ago Corneal abrasion of left eye due to contact lens   [facility], Jodelle Gross, FNP   9 months ago DM type 2 with diabetic peripheral neuropathy [facility])   [facility], Jodelle Gross, FNP   1 year ago Vaginal candidiasis   [facility] Galen Manila, NP   1 year ago DM type 2 with diabetic peripheral neuropathy [facility])   [facility] Rudd, Alison Stalling, NP

## 2020-09-12 ENCOUNTER — Other Ambulatory Visit: Payer: Self-pay | Admitting: Family Medicine

## 2020-09-12 DIAGNOSIS — E1142 Type 2 diabetes mellitus with diabetic polyneuropathy: Secondary | ICD-10-CM

## 2020-09-18 ENCOUNTER — Ambulatory Visit: Payer: Self-pay | Admitting: Family Medicine

## 2020-09-18 ENCOUNTER — Ambulatory Visit: Payer: Self-pay | Admitting: *Deleted

## 2020-09-18 ENCOUNTER — Telehealth (INDEPENDENT_AMBULATORY_CARE_PROVIDER_SITE_OTHER): Payer: 59 | Admitting: Family Medicine

## 2020-09-18 ENCOUNTER — Other Ambulatory Visit: Payer: Self-pay

## 2020-09-18 ENCOUNTER — Encounter: Payer: Self-pay | Admitting: Family Medicine

## 2020-09-18 VITALS — BP 145/60

## 2020-09-18 DIAGNOSIS — R300 Dysuria: Secondary | ICD-10-CM | POA: Diagnosis not present

## 2020-09-18 MED ORDER — PHENAZOPYRIDINE HCL 100 MG PO TABS
100.0000 mg | ORAL_TABLET | Freq: Three times a day (TID) | ORAL | 0 refills | Status: AC | PRN
Start: 2020-09-18 — End: 2020-09-20

## 2020-09-18 MED ORDER — NITROFURANTOIN MONOHYD MACRO 100 MG PO CAPS: 100.0000 mg | ORAL_CAPSULE | Freq: Two times a day (BID) | ORAL | 0 refills | Status: AC

## 2020-09-18 NOTE — Telephone Encounter (Signed)
Patient is calling to report she is having urinary symptoms- patient is s/p shoulder surgery. Patient states she has burning with urination, frequency and urgency has started. Patient is requesting a virtual visit so she can be treated as soon as possible. Virtual appointment scheduled.  Reason for Disposition . Urinating more frequently than usual (i.e., frequency)  Answer Assessment - Initial Assessment Questions 1. SYMPTOM: "What's the main symptom you're concerned about?" (e.g., frequency, incontinence)     Burning with urination- frequency and urgency 2. ONSET: "When did the  Urinary symptoms  start?"     Late Sunday 3. PAIN: "Is there any pain?" If Yes, ask: "How bad is it?" (Scale: 1-10; mild, moderate, severe)     Yes- moderate 4. CAUSE: "What do you think is causing the symptoms?"     UTI 5. OTHER SYMPTOMS: "Do you have any other symptoms?" (e.g., fever, flank pain, blood in urine, pain with urination)     Pain with urination, possible fever Sunday 6. PREGNANCY: "Is there any chance you are pregnant?" "When was your last menstrual period?"     n/a  Protocols used: URINARY [facility]

## 2020-09-18 NOTE — Telephone Encounter (Signed)
Virtual OV completed 09/18/2020

## 2020-09-18 NOTE — Telephone Encounter (Signed)
Patient was released from hospital on 08/27/20 after shoulder surgery  Patient was given a two week prescription of antibiotics  Patient is now suffering from uterine discomfort (burning sensation while urinating)  Patient would like to know of potential medications to help ease discomfort

## 2020-09-18 NOTE — Assessment & Plan Note (Signed)
Dysuria with urinary hesitancy and incomplete emptying x 2 days.  Will treat with Macrobid 100mg  BID x 5 days and pyridium 100mg  TID PRN for discomfort.  If no improvement after completing treatment, to return to clinic and we will send a urine culture for further evaluation.

## 2020-09-18 NOTE — Progress Notes (Signed)
Virtual Visit via Telephone  The purpose of this virtual visit is to provide medical care while limiting exposure to the novel coronavirus (COVID19) for both patient and office staff.  Consent was obtained for phone visit:  Yes.   Answered questions that patient had about telehealth interaction:  Yes.   I discussed the limitations, risks, security and privacy concerns of performing an evaluation and management service by telephone. I also discussed with the patient that there may be a patient responsible charge related to this service. The patient expressed understanding and agreed to proceed.  Patient is at home and is accessed via telephone Services are provided by Harlin Rain, FNP-C from [facility])  ---------------------------------------------------------------------- Chief Complaint  Patient presents with  . Urinary Frequency    Dysuria, urinary hesitancy x 2 days    S: Reviewed CMA documentation. I have called patient and gathered additional HPI as follows:  Ms. Renderos presents for virtual telemedicine visit via telephone with concerns of dysuria, urinary hesitancy and incomplete emptying x 2 days.  Denies fevers, back pain, hematuria, frequency or urgency.  Denies recent antibiotic usage.  Patient is currently home Denies any high risk travel to areas of current concern for COVID19. Denies any known or suspected exposure to person with or possibly with COVID19.  Past Medical History:  Diagnosis Date  . Allergy   . Arthritis   . Diabetes mellitus without complication ([place])   . Enlarged heart   . Hypertension    Social History   Tobacco Use  . Smoking status: Never Smoker  . Smokeless tobacco: Never Used  Vaping Use  . Vaping Use: Never used  Substance Use Topics  . Alcohol use: No    Alcohol/week: 0.0 standard drinks  . Drug use: No    Current Outpatient Medications:  .  acetaminophen (TYLENOL) 500 MG tablet, Take 2 tablets (1,000  mg total) by mouth every 8 (eight) hours., Disp: 90 tablet, Rfl: 2 .  amLODipine (NORVASC) 10 MG tablet, Take 1 tablet by mouth once daily, Disp: 90 tablet, Rfl: 0 .  carvedilol (COREG) 25 MG tablet, TAKE 1 TABLET BY MOUTH TWICE DAILY WITH A MEAL, Disp: 180 tablet, Rfl: 0 .  diclofenac (VOLTAREN) 75 MG EC tablet, Take 1 tablet (75 mg total) by mouth daily as needed for moderate pain., Disp: 30 tablet, Rfl: 5 .  enalapril (VASOTEC) 20 MG tablet, Take 1 tablet by mouth twice daily, Disp: 180 tablet, Rfl: 0 .  furosemide (LASIX) 40 MG tablet, Take 2 tablets (80 mg total) by mouth daily., Disp: 180 tablet, Rfl: 0 .  gabapentin (NEURONTIN) 100 MG capsule, Take 1 capsule by mouth at bedtime, Disp: 90 capsule, Rfl: 2 .  glucose blood (RELION GLUCOSE TEST STRIPS) test strip, Use as instructed, Disp: 100 each, Rfl: 12 .  insulin glargine (LANTUS) 100 UNIT/ML injection, Inject 32 Units into the skin at bedtime., Disp: , Rfl:  .  JARDIANCE 25 MG TABS tablet, Take 1 tablet by mouth once daily, Disp: 30 tablet, Rfl: 0 .  nitrofurantoin, macrocrystal-monohydrate, (MACROBID) 100 MG capsule, Take 1 capsule (100 mg total) by mouth 2 (two) times daily for 5 days., Disp: 10 capsule, Rfl: 0 .  phenazopyridine (PYRIDIUM) 100 MG tablet, Take 1 tablet (100 mg total) by mouth 3 (three) times daily as needed for up to 2 days for pain., Disp: 6 tablet, Rfl: 0 .  ReliOn Ultra Thin Lancets MISC, 1 each by Does not apply route daily., Disp:  100 each, Rfl: 11 .  rosuvastatin (CRESTOR) 20 MG tablet, Take 1 tablet (20 mg total) by mouth daily. (Patient taking differently: Take 20 mg by mouth at bedtime.), Disp: 90 tablet, Rfl: 1 .  sitaGLIPtin (JANUVIA) 100 MG tablet, Take 1 tablet by mouth once daily, Disp: 90 tablet, Rfl: 0 .  tiZANidine (ZANAFLEX) 2 MG tablet, Take 1 tablet (2 mg total) by mouth every 8 (eight) hours as needed for muscle spasms (pt not certain of dosage)., Disp: 90 tablet, Rfl: 2 .  ondansetron (ZOFRAN ODT) 4 MG  disintegrating tablet, Take 1 tablet (4 mg total) by mouth every 8 (eight) hours as needed for nausea or vomiting. (Patient not taking: Reported on 09/18/2020), Disp: 20 tablet, Rfl: 0 .  oxyCODONE (ROXICODONE) 5 MG immediate release tablet, Take 1-2 tablets (5-10 mg total) by mouth every 4 (four) hours as needed (pain). (Patient not taking: Reported on 09/18/2020), Disp: 30 tablet, Rfl: 0 .  ULTICARE MINI PEN NEEDLES 32G X 6 MM MISC, , Disp: , Rfl:   Depression screen [facility] 2/9 04/23/2020 04/05/2020 11/11/2019  Decreased Interest 0 0 0  Down, Depressed, Hopeless 0 0 0  PHQ - 2 Score 0 0 0    No flowsheet data found.  -------------------------------------------------------------------------- O: No physical exam performed due to remote telephone encounter.  Physical Exam: Patient remotely monitored without video.  Verbal communication appropriate.  Cognition normal.  Recent Results (from the past 2160 hour(s))  POCT glycosylated hemoglobin (Hb A1C)     Status: Abnormal   Collection Time: 08/07/20  9:29 AM  Result Value Ref Range   Hemoglobin A1C 7.4 (A) 4.0 - 5.6 %   HbA1c POC (<> result, manual entry)     HbA1c, POC (prediabetic range)     HbA1c, POC (controlled diabetic range)    Surgical pcr screen     Status: None   Collection Time: 08/21/20 11:37 AM   Specimen: Nasal Mucosa; Nasal Swab  Result Value Ref Range   MRSA, PCR NEGATIVE NEGATIVE   Staphylococcus aureus NEGATIVE NEGATIVE    Comment: (NOTE) The Xpert SA Assay (FDA approved for NASAL specimens in patients 69 years of age and older), is one component of a comprehensive surveillance program. It is not intended to diagnose infection nor to guide or monitor treatment. Performed at [facility], [address] [place], [place] [address]   CBC WITH DIFFERENTIAL     Status: Abnormal   Collection Time: 08/21/20 11:37 AM  Result Value Ref Range   WBC 10.7 (H) 4.0 - 10.5 K/uL   RBC 4.89 3.87 - 5.11 MIL/uL   Hemoglobin  12.8 12.0 - 15.0 g/dL   HCT 40.7 36.0 - 46.0 %   MCV 83.2 80.0 - 100.0 fL   MCH 26.2 26.0 - 34.0 pg   MCHC 31.4 30.0 - 36.0 g/dL   RDW 14.6 11.5 - 15.5 %   Platelets 481 (H) 150 - 400 K/uL   nRBC 0.0 0.0 - 0.2 %   Neutrophils Relative % 64 %   Neutro Abs 6.9 1.7 - 7.7 K/uL   Lymphocytes Relative 21 %   Lymphs Abs 2.2 0.7 - 4.0 K/uL   Monocytes Relative 6 %   Monocytes Absolute 0.7 0.1 - 1.0 K/uL   Eosinophils Relative 8 %   Eosinophils Absolute 0.8 (H) 0.0 - 0.5 K/uL   Basophils Relative 1 %   Basophils Absolute 0.1 0.0 - 0.1 K/uL   Immature Granulocytes 0 %   Abs Immature  Granulocytes 0.04 0.00 - 0.07 K/uL    Comment: Performed at [facility], [address] [place], [place] [address]  Comprehensive metabolic panel     Status: Abnormal   Collection Time: 08/21/20 11:37 AM  Result Value Ref Range   Sodium 141 135 - 145 mmol/L   Potassium 4.0 3.5 - 5.1 mmol/L   Chloride 105 98 - 111 mmol/L   CO2 26 22 - 32 mmol/L   Glucose, Bld 141 (H) 70 - 99 mg/dL    Comment: Glucose reference range applies only to samples taken after fasting for at least 8 hours.   BUN 18 8 - 23 mg/dL   Creatinine, Ser 0.67 0.44 - 1.00 mg/dL   Calcium 9.1 8.9 - 10.3 mg/dL   Total Protein 7.9 6.5 - 8.1 g/dL   Albumin 4.0 3.5 - 5.0 g/dL   AST 17 15 - 41 U/L   ALT 17 0 - 44 U/L   Alkaline Phosphatase 100 38 - 126 U/L   Total Bilirubin 0.6 0.3 - 1.2 mg/dL   GFR, Estimated >60 >60 mL/min    Comment: (NOTE) Calculated using the CKD-EPI Creatinine Equation (2021)    Anion gap 10 5 - 15    Comment: Performed at [facility], [address] [place], [place] [address]  Urinalysis, Routine w reflex microscopic     Status: Abnormal   Collection Time: 08/21/20 11:37 AM  Result Value Ref Range   Color, Urine YELLOW (A) YELLOW   APPearance CLEAR (A) CLEAR   Specific Gravity, Urine 1.035 (H) 1.005 - 1.030   pH 5.0 5.0 - 8.0   Glucose, UA >=500 (A) NEGATIVE mg/dL   Hgb urine dipstick  NEGATIVE NEGATIVE   Bilirubin Urine NEGATIVE NEGATIVE   Ketones, ur NEGATIVE NEGATIVE mg/dL   Protein, ur NEGATIVE NEGATIVE mg/dL   Nitrite NEGATIVE NEGATIVE   Leukocytes,Ua NEGATIVE NEGATIVE   RBC / HPF 0-5 0 - 5 RBC/hpf   WBC, UA 0-5 0 - 5 WBC/hpf   Bacteria, UA NONE SEEN NONE SEEN   Squamous Epithelial / LPF 0-5 0 - 5    Comment: Performed at [facility], [address] [place], [place] [address]  Type and screen Order type and screen if day of surgery is less than 15 days from draw of preadmission visit or order morning of surgery if day of surgery is greater than 6 days from preadmission visit.     Status: None   Collection Time: 08/21/20 11:37 AM  Result Value Ref Range   ABO/RH(D) O POS    Antibody Screen NEG    Sample Expiration 09/04/2020,2359    Extend sample reason      NO TRANSFUSIONS OR PREGNANCY IN THE PAST 3 MONTHS Performed at [facility], [address] [place], [place] [address]   SARS CORONAVIRUS 2 (TAT 6-24 HRS) Nasopharyngeal Nasopharyngeal Swab     Status: None   Collection Time: 08/23/20 10:43 AM   Specimen: Nasopharyngeal Swab  Result Value Ref Range   SARS Coronavirus 2 NEGATIVE NEGATIVE    Comment: (NOTE) SARS-CoV-2 target nucleic acids are NOT DETECTED.  The SARS-CoV-2 RNA is generally detectable in upper and lower respiratory specimens during the acute phase of infection. Negative results do not preclude SARS-CoV-2 infection, do not rule out co-infections with other pathogens, and should not be used as the sole basis for treatment or other patient management decisions. Negative results must be combined with clinical observations, patient history, and epidemiological information.  The expected result is Negative.  Fact Sheet for Patients: SugarRoll.be  Fact Sheet for Healthcare Providers: https://www.woods-mathews.com/  This test is not yet approved or cleared by the [place] FDA  and  has been authorized for detection and/or diagnosis of SARS-CoV-2 by FDA under an Emergency Use Authorization (EUA). This EUA will remain  in effect (meaning this test can be used) for the duration of the COVID-19 declaration under Se ction 564(b)(1) of the Act, 21 U.S.C. section 360bbb-3(b)(1), unless the authorization is terminated or revoked sooner.  Performed at [facility], [place] [address] [place], [place] [address]   Glucose, capillary     Status: Abnormal   Collection Time: 08/27/20  6:18 AM  Result Value Ref Range   Glucose-Capillary 159 (H) 70 - 99 mg/dL    Comment: Glucose reference range applies only to samples taken after fasting for at least 8 hours.  ABO/Rh     Status: None   Collection Time: 08/27/20  6:42 AM  Result Value Ref Range   ABO/RH(D)      O POS Performed at [facility], [address] [place], [place] [address]   Surgical pathology     Status: None   Collection Time: 08/27/20  9:40 AM  Result Value Ref Range   SURGICAL PATHOLOGY      SURGICAL PATHOLOGY CASE: [id number] PATIENT: Zionna Streight Surgical Pathology Report     Specimen Submitted: A. Right humeral head  Clinical History: Right shoulder pain, unspecified chronicity M25.11    DIAGNOSIS: A. HUMERAL HEAD, RIGHT; ARTHROPLASTY: - DEGENERATIVE JOINT DISEASE. - MILDLY HYPERCELLULAR TRILINEAGE HEMATOPOIETIC MARROW. - NO EVIDENCE OF MALIGNANCY.  GROSS DESCRIPTION: A. Labeled: Right humeral head post TSA Received: Formalin Size of specimen:      Head -4.9 x 4.9 x 1.7 cm      Neck -not grossly present      Additional tissue: Not grossly present Articular surface: The articular surface is tan-white and diffusely roughened with a central 2.8 x 2.1 cm area of eburnation. Cut surface: The cut surface is tan-yellow and firm. Other findings: None grossly appreciated.  Block summary: 1 -representative sections of eburnation to adjacent roughened cartilage, following  decalcification  Tissue decalcification: Yes, 1 cassette   Final Diagn osis performed by Allena Napoleon, MD.   Electronically signed 08/29/2020 10:19:31AM The electronic signature indicates that the named Attending Pathologist has evaluated the specimen Technical component performed at [facility], [address] [place], [place] [address] Lab: [phone] Dir: Rush Farmer, MD, MMM  Professional component performed at [facility], [place], [place], [place] [address] Lab: [phone] Dir: Dellia Nims. Rubinas, MD   Glucose, capillary     Status: Abnormal   Collection Time: 08/27/20 11:47 AM  Result Value Ref Range   Glucose-Capillary 153 (H) 70 - 99 mg/dL    Comment: Glucose reference range applies only to samples taken after fasting for at least 8 hours.    -------------------------------------------------------------------------- A&P:  Problem List Items Addressed This Visit      Other   Dysuria - Primary    Dysuria with urinary hesitancy and incomplete emptying x 2 days.  Will treat with Macrobid 100mg  BID x 5 days and pyridium 100mg  TID PRN for discomfort.  If no improvement after completing treatment, to return to clinic and we will send a urine culture for further evaluation.      Relevant Medications   phenazopyridine (PYRIDIUM) 100 MG tablet   nitrofurantoin, macrocrystal-monohydrate, (MACROBID) 100  MG capsule      Meds ordered this encounter  Medications  . phenazopyridine (PYRIDIUM) 100 MG tablet    Sig: Take 1 tablet (100 mg total) by mouth 3 (three) times daily as needed for up to 2 days for pain.    Dispense:  6 tablet    Refill:  0  . nitrofurantoin, macrocrystal-monohydrate, (MACROBID) 100 MG capsule    Sig: Take 1 capsule (100 mg total) by mouth 2 (two) times daily for 5 days.    Dispense:  10 capsule    Refill:  0    Follow-up: - Return if symptoms worsen or fail to improve  Patient verbalizes understanding with the above  medical recommendations including the limitation of remote medical advice.  Specific follow-up and call-back criteria were given for patient to follow-up or seek medical care more urgently if needed.  - Time spent in direct consultation with patient on phone: 5 minutes  Harlin Rain, [facility] 09/18/2020, 3:41 PM

## 2020-09-18 NOTE — Telephone Encounter (Signed)
See triage note- patient has been scheduled for appointment with provider.

## 2020-10-10 ENCOUNTER — Other Ambulatory Visit: Payer: Self-pay

## 2020-10-10 DIAGNOSIS — E1142 Type 2 diabetes mellitus with diabetic polyneuropathy: Secondary | ICD-10-CM

## 2020-10-10 MED ORDER — SITAGLIPTIN PHOSPHATE 100 MG PO TABS: ORAL_TABLET | ORAL | 0 refills | Status: DC

## 2020-10-13 IMAGING — CR DG SHOULDER 2+V*R*
3 series · 3 of 3 positions shown · non-contrast
Comparison: Chest x-ray 03/17/2016

CLINICAL DATA: Right shoulder pain a few months.  No injury.

EXAM:
RIGHT SHOULDER - 2+ VIEW

[shoulder grashey]
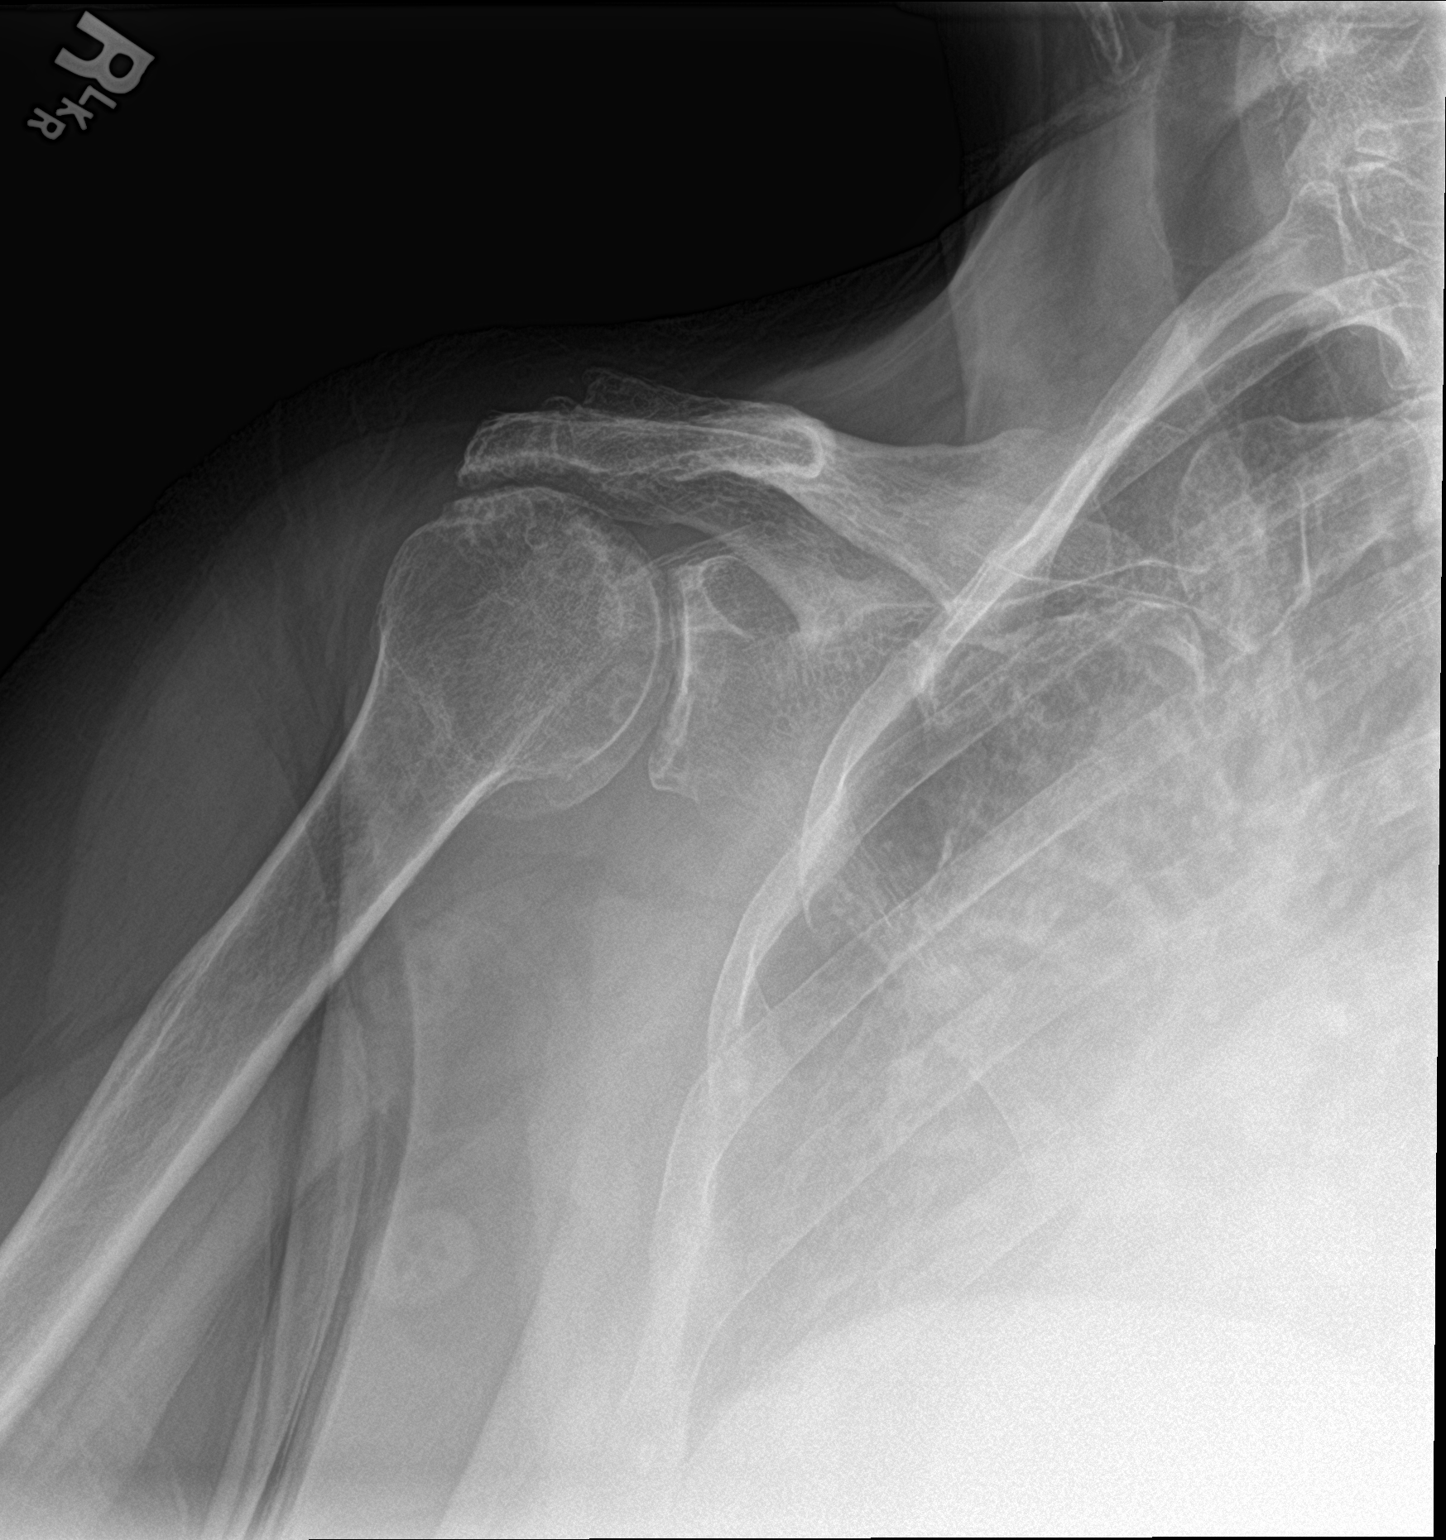

[shoulder y view]
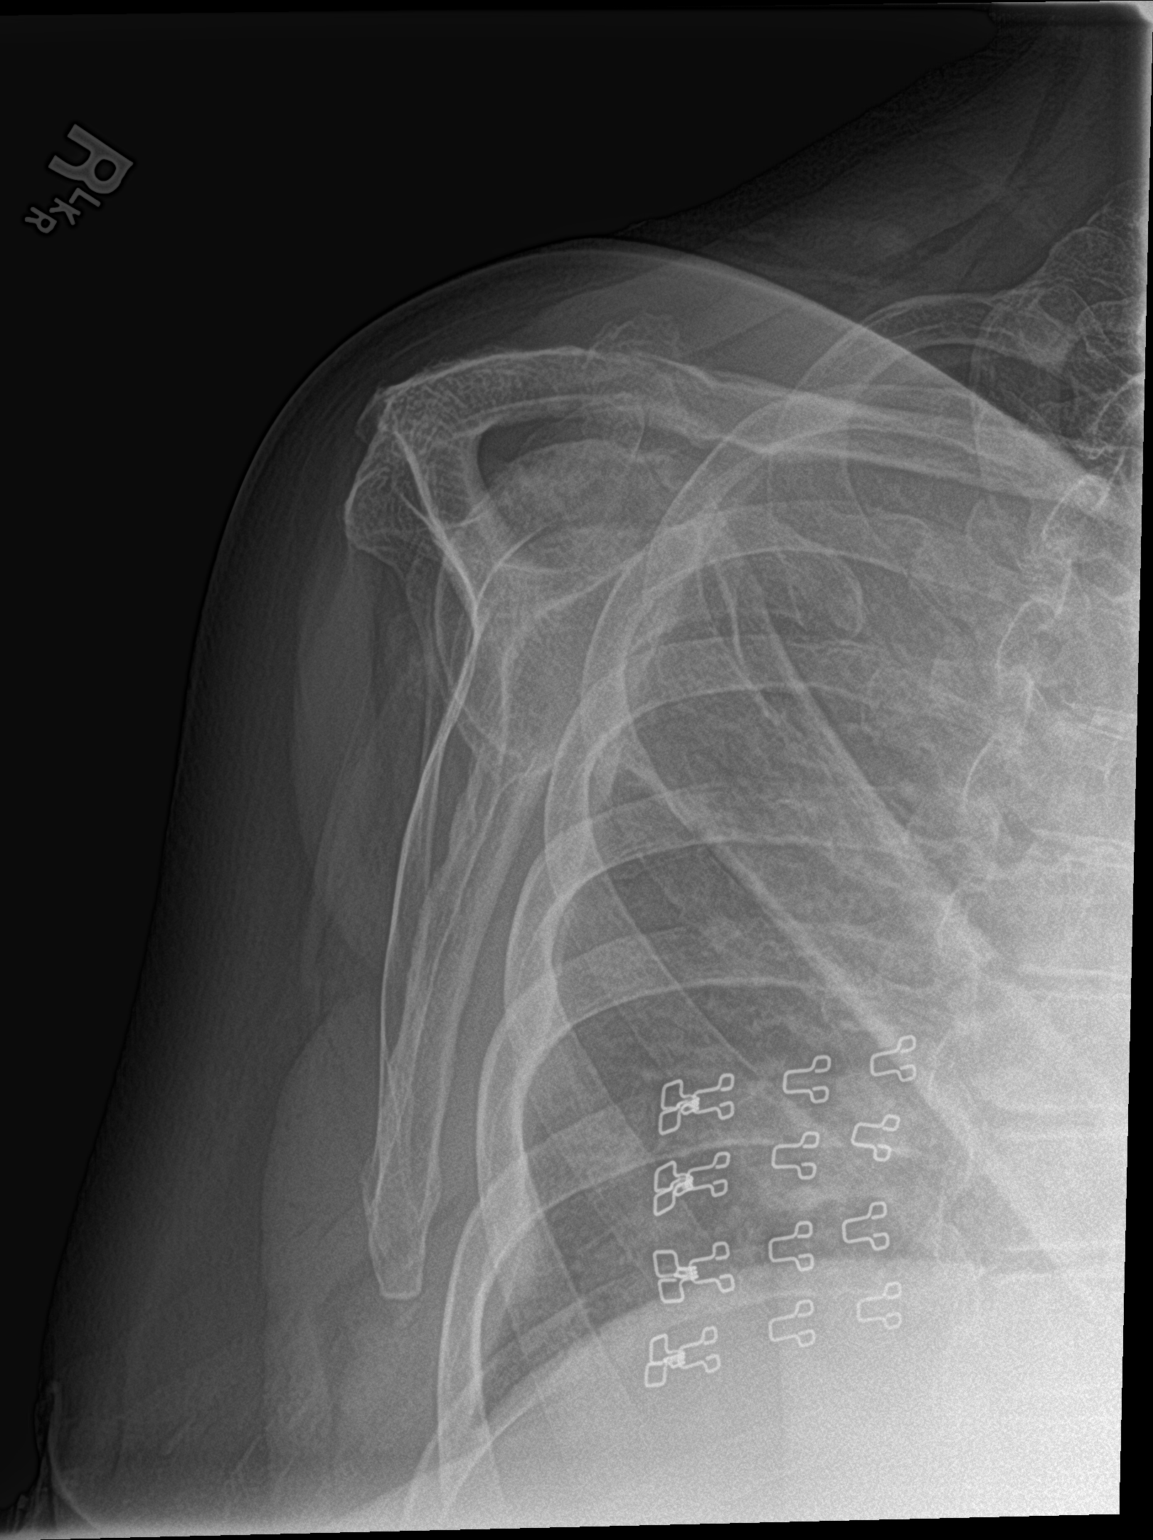

[shoulder axillary]
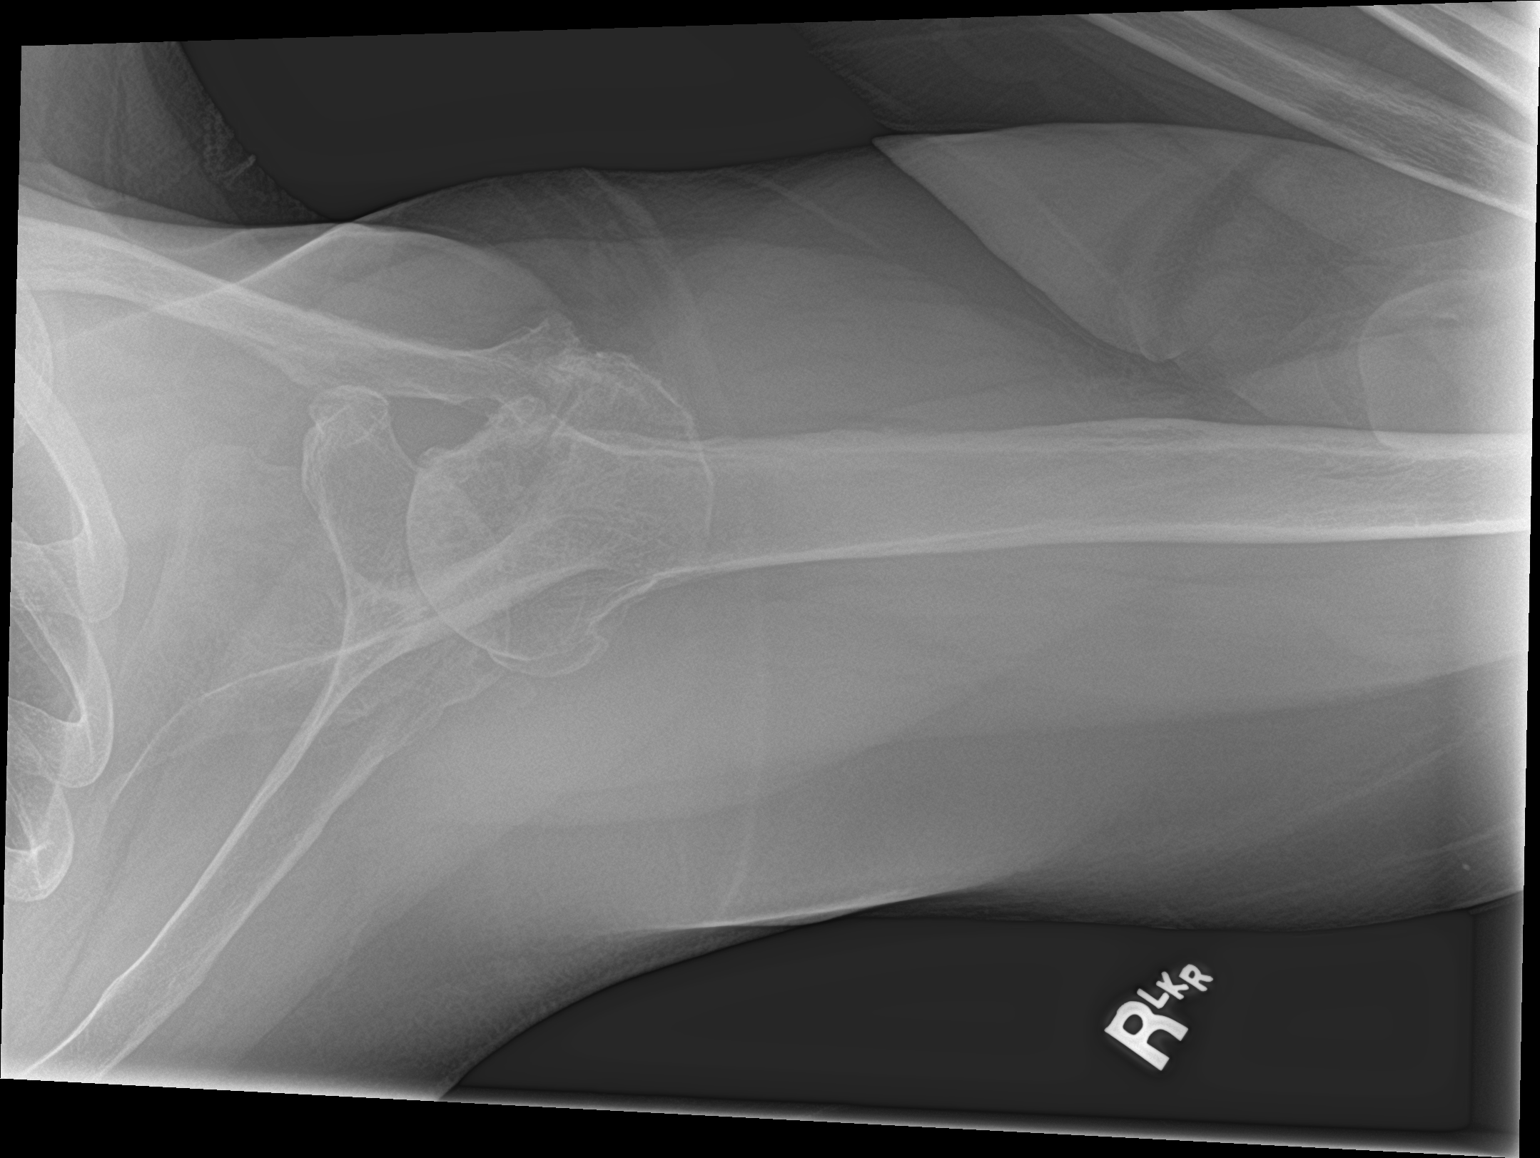

[3 of 3 positions shown; findings below may reference images not displayed]

FINDINGS: Mild degenerative changes of the AC joint and glenohumeral joints.
No evidence of fracture or dislocation. No lytic or sclerotic
lesions.
IMPRESSION: No acute findings.

Mild degenerative changes.

## 2020-10-29 LAB — HM DIABETES EYE EXAM

## 2020-11-29 ENCOUNTER — Other Ambulatory Visit: Payer: Self-pay | Admitting: Student in an Organized Health Care Education/Training Program

## 2020-11-29 DIAGNOSIS — M47816 Spondylosis without myelopathy or radiculopathy, lumbar region: Secondary | ICD-10-CM

## 2020-11-29 DIAGNOSIS — M19011 Primary osteoarthritis, right shoulder: Secondary | ICD-10-CM

## 2020-12-13 ENCOUNTER — Encounter: Payer: Self-pay | Admitting: Student in an Organized Health Care Education/Training Program

## 2020-12-13 ENCOUNTER — Other Ambulatory Visit: Payer: Self-pay

## 2020-12-13 ENCOUNTER — Ambulatory Visit
Payer: 59 | Attending: Student in an Organized Health Care Education/Training Program | Admitting: Student in an Organized Health Care Education/Training Program

## 2020-12-13 VITALS — BP 176/74 | HR 80 | Temp 97.2°F | Resp 16 | Ht 62.0 in | Wt 230.0 lb

## 2020-12-13 DIAGNOSIS — M47816 Spondylosis without myelopathy or radiculopathy, lumbar region: Secondary | ICD-10-CM | POA: Diagnosis present

## 2020-12-13 DIAGNOSIS — M5136 Other intervertebral disc degeneration, lumbar region: Secondary | ICD-10-CM | POA: Diagnosis present

## 2020-12-13 DIAGNOSIS — M19011 Primary osteoarthritis, right shoulder: Secondary | ICD-10-CM | POA: Insufficient documentation

## 2020-12-13 DIAGNOSIS — G894 Chronic pain syndrome: Secondary | ICD-10-CM | POA: Insufficient documentation

## 2020-12-13 MED ORDER — KETOROLAC TROMETHAMINE 30 MG/ML IJ SOLN
30.0000 mg | Freq: Once | INTRAMUSCULAR | Status: AC
  Administered 2020-12-13: 30 mg via INTRAMUSCULAR

## 2020-12-13 MED ORDER — KETOROLAC TROMETHAMINE 30 MG/ML IJ SOLN
INTRAMUSCULAR | Status: AC
  Filled 2020-12-13: qty 1

## 2020-12-13 MED ORDER — DICLOFENAC SODIUM 75 MG PO TBEC
75.0000 mg | DELAYED_RELEASE_TABLET | Freq: Every day | ORAL | 5 refills | Status: DC | PRN
Start: 2020-12-13 — End: 2021-06-03

## 2020-12-13 MED ORDER — TIZANIDINE HCL 4 MG PO TABS
4.0000 mg | ORAL_TABLET | Freq: Every evening | ORAL | 2 refills | Status: AC | PRN
Start: 2020-12-13 — End: 2021-03-13

## 2020-12-13 MED ORDER — ORPHENADRINE CITRATE 30 MG/ML IJ SOLN
30.0000 mg | Freq: Once | INTRAMUSCULAR | Status: AC
  Administered 2020-12-13: 30 mg via INTRAMUSCULAR
  Filled 2020-12-13: qty 2

## 2020-12-13 NOTE — Progress Notes (Signed)
Safety precautions to be maintained throughout the outpatient stay will include: orient to surroundings, keep bed in low position, maintain call bell within reach at all times, provide assistance with transfer out of bed and ambulation.  

## 2020-12-13 NOTE — Progress Notes (Signed)
PROVIDER NOTE: Information contained herein reflects review and annotations entered in association with encounter. Interpretation of such information and data should be left to medically-trained personnel. Information provided to patient can be located elsewhere in the medical record under "Patient Instructions". Document created using STT-dictation technology, any transcriptional errors that may result from process are unintentional.    Patient: Stacy Martinez  Service Category: E/M  Provider: Gillis Santa, MD  DOB: 1956-04-26  DOS: 12/13/2020  Specialty: Interventional Pain Management  MRN: [id number]  Setting: Ambulatory outpatient  PCP: Verl Bangs, FNP  Type: Established Patient    Referring Provider: Verl Bangs, FNP  Location: Office  Delivery: Face-to-face     HPI  [name], a 65 y.o. 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 Ms. Ackley primary complain today is Back Pain (lower) and Knee Pain (bilateral) Last encounter: My last encounter with her was on 11/29/2020. Pertinent problems: Ms. Blackston has Facet syndrome, lumbar; DJD (degenerative joint disease) of knee; Sacroiliac joint dysfunction; Disorder of right rotator cuff; Chronic right shoulder pain; Primary osteoarthritis of right shoulder; and Lumbar spondylosis on their pertinent problem list. Pain Assessment: Severity of Chronic pain is reported as a 9 /10. Location: Back Lower/both upper legs. Onset: More than a month ago. Quality: Aching,Throbbing. Timing: Constant. Modifying factor(s): nothing. Vitals:  height is $RemoveB'5\' 2"'oZxWesnG$  (1.575 m) and weight is 230 lb (104.3 kg). Her temporal temperature is 97.2 F (36.2 C) (abnormal). Her blood pressure is 176/74 (abnormal) and her pulse is 80. Her respiration is 16 and oxygen saturation is 99%.   Reason for encounter: medication management.    Had right shoulder surgery in December for [facility] tear with Dr Posey Pronto, states that she is recovering from  surgery [place] 2 weeks ago, on carpet, in her bedroom. Injured her low back and both of her knees. States that she has been having increased low back pain that is worse with facet loading.  Her previous lumbar facet medial branch nerve blocks were last August, 2021 for which she received 90% pain relief for 3 months. She states that the pain seems similar to the pain along her facets however it is more intense. We discussed repeating lumbar facet medial branch nerve blocks for low back pain relief. Of note, patient has done physical therapy in the past for 4 weeks without any improvement in her pain.  ROS  Constitutional: Denies any fever or chills Gastrointestinal: No reported hemesis, hematochezia, vomiting, or acute GI distress Musculoskeletal: Low back pain, bilateral knee pain Neurological: No reported episodes of acute onset apraxia, aphasia, dysarthria, agnosia, amnesia, paralysis, loss of coordination, or loss of consciousness  Medication Review  Insulin Pen Needle, ReliOn Ultra Thin Lancets, acetaminophen, amLODipine, carvedilol, diclofenac, empagliflozin, enalapril, furosemide, gabapentin, glucose blood, insulin glargine, rosuvastatin, sitaGLIPtin, and tiZANidine  History Review  Allergy: Ms. Hill is allergic to prednisone and metformin and related. Drug: Ms. Malkowski  reports no history of drug use. Alcohol:  reports no history of alcohol use. Tobacco:  reports that she has never smoked. She has never used smokeless tobacco. Social: Ms. Wheeler  reports that she has never smoked. She has never used smokeless tobacco. She reports that she does not drink alcohol and does not use drugs. Medical:  has a past medical history of Allergy, Arthritis, Diabetes mellitus without complication ([place]), Enlarged heart, and Hypertension. Surgical: Ms. Sachdev  has a past surgical history that includes Wrist fracture surgery (Right, 2013); Cesarean  section (1999, 2001); Hernia repair (2001, 2006); Fracture  surgery; and Reverse shoulder arthroplasty (Right, 08/27/2020). Family: family history includes Cancer in her father; Diabetes in her mother; Mental illness in her son.  Laboratory Chemistry Profile   Renal Lab Results  Component Value Date   BUN 18 08/21/2020   CREATININE 0.67 08/21/2020   BCR 37 (H) 03/22/2019   GFRAA 102 03/22/2019   GFRNONAA >60 08/21/2020     Hepatic Lab Results  Component Value Date   AST 17 08/21/2020   ALT 17 08/21/2020   ALBUMIN 4.0 08/21/2020   ALKPHOS 100 08/21/2020   LIPASE 19 12/18/2013     Electrolytes Lab Results  Component Value Date   NA 141 08/21/2020   K 4.0 08/21/2020   CL 105 08/21/2020   CALCIUM 9.1 08/21/2020     Bone No results found for: VD25OH, DJ242AS3MHD, QQ2297LG9, QJ1941DE0, 25OHVITD1, 25OHVITD2, 25OHVITD3, TESTOFREE, TESTOSTERONE   Inflammation (CRP: Acute Phase) (ESR: Chronic Phase) Lab Results  Component Value Date   LATICACIDVEN 1.72 01/02/2014       Note: Above Lab results reviewed.  Recent Imaging Review  DG Shoulder Right Port CLINICAL DATA:  Status post total shoulder arthroplasty  EXAM: PORTABLE RIGHT SHOULDER: 1 V  COMPARISON:  Right shoulder MRI and right shoulder CT July 20, 2020  FINDINGS: Axillary view obtained. Total shoulder replacement on the right with prosthetic components well-seated. No fracture or dislocation. Air within the joint is an expected postoperative finding.  IMPRESSION: Total shoulder replacement right with prosthetic components well-seated. No acute fracture or dislocation. Acute postoperative changes noted.  Electronically Signed   By: Lowella Grip III M.D.   On: 08/27/2020 12:05 [place] OR NERVE BLOCK-IMAGE ONLY ([place]) There is no interpretation for this exam.    This order is for images obtained during a surgical procedure.  Please See  "Surgeries" Tab for more information regarding the procedure.  FINDINGS: There is anterolisthesis of L5 on S1 by  approximately 6 mm and anterolisthesis of L4 on L5 by approximately 9 mm. These malalignments appear to be due to significant degenerative change involving the facet joints at these levels. There is degenerative disc disease at both L4-5 and L5-S1 levels. No compression deformity is seen. The SI joints appear corticated.  IMPRESSION: 1. Anterolisthesis of L5 on S1 and L4 on L5 as noted above most likely degenerative in origin. 2. Degenerative disc disease at both L4-5 and L5-S1 levels.   Note: Reviewed        Physical Exam  General appearance: Well nourished, well developed, and well hydrated. In no apparent acute distress Mental status: Alert, oriented x 3 (person, place, & time)       Respiratory: No evidence of acute respiratory distress Eyes: PERLA Vitals: BP (!) 176/74   Pulse 80   Temp (!) 97.2 F (36.2 C) (Temporal)   Resp 16   Ht $R'5\' 2"'LW$  (1.575 m)   Wt 230 lb (104.3 kg)   SpO2 99%   BMI 42.07 kg/m  BMI: Estimated body mass index is 42.07 kg/m as calculated from the following:   Height as of this encounter: $RemoveBeforeD'5\' 2"'PxsMykScIpDgLH$  (1.575 m).   Weight as of this encounter: 230 lb (104.3 kg). Ideal: Ideal body weight: 50.1 kg (110 lb 7.2 oz) Adjusted ideal body weight: 71.8 kg (158 lb 4.3 oz)  Lumbar Spine Area Exam  Skin & Axial Inspection: No masses, redness, or swelling Alignment: Symmetrical Functional ROM: Pain restricted ROM affecting both sides Stability: No instability detected  Muscle Tone/Strength: Functionally intact. No obvious neuro-muscular anomalies detected. Sensory (Neurological): Musculoskeletal pain pattern Palpation: No palpable anomalies       Provocative Tests: Hyperextension/rotation test: (+) bilaterally for facet joint pain. Lumbar quadrant test (Kemp's test): (+) bilaterally for facet joint pain. Lateral bending test: deferred today        Gait & Posture Assessment  Ambulation: Unassisted Gait: Relatively normal for age and body habitus Posture: WNL   Lower Extremity Exam    Side: Right lower extremity  Side: Left lower extremity  Stability: No instability observed          Stability: No instability observed          Skin & Extremity Inspection: Skin color, temperature, and hair growth are WNL. No peripheral edema or cyanosis. No masses, redness, swelling, asymmetry, or associated skin lesions. No contractures.  Skin & Extremity Inspection: Skin color, temperature, and hair growth are WNL. No peripheral edema or cyanosis. No masses, redness, swelling, asymmetry, or associated skin lesions. No contractures.  Functional ROM: Unrestricted ROM                  Functional ROM: Unrestricted ROM                  Muscle Tone/Strength: Functionally intact. No obvious neuro-muscular anomalies detected.  Muscle Tone/Strength: Functionally intact. No obvious neuro-muscular anomalies detected.  Sensory (Neurological): Unimpaired        Sensory (Neurological): Unimpaired        DTR: Patellar: deferred today Achilles: deferred today Plantar: deferred today  DTR: Patellar: deferred today Achilles: deferred today Plantar: deferred today  Palpation: No palpable anomalies  Palpation: No palpable anomalies   Assessment   Status Diagnosis  Persistent Persistent Persistent 1. Facet syndrome, lumbar   2. Spondylosis without myelopathy or radiculopathy, lumbar region   3. Lumbar spondylosis   4. DDD (degenerative disc disease), lumbar   5. Chronic pain syndrome   6. Primary osteoarthritis of right shoulder       Plan of Care   Ms. Deatra James Krage has a current medication list which includes the following long-term medication(s): amlodipine, carvedilol, enalapril, furosemide, gabapentin, rosuvastatin, and sitagliptin.  Pharmacotherapy (Medications Ordered): Meds ordered this encounter  Medications  . orphenadrine (NORFLEX) injection 30 mg  . ketorolac (TORADOL) 30 MG/ML injection 30 mg  . tiZANidine (ZANAFLEX) 4 MG tablet    Sig: Take 1-1.5 tablets  (4-6 mg total) by mouth at bedtime as needed for muscle spasms.    Dispense:  45 tablet    Refill:  2    Do not place this medication, or any other prescription from our practice, on "Automatic Refill". Patient may have prescription filled one day early if pharmacy is closed on scheduled refill date.  . diclofenac (VOLTAREN) 75 MG EC tablet    Sig: Take 1 tablet (75 mg total) by mouth daily as needed for moderate pain.    Dispense:  30 tablet    Refill:  5   Orders:  Orders Placed This Encounter  Procedures  . LUMBAR FACET(MEDIAL BRANCH NERVE BLOCK) MBNB    Standing Status:   Future    Standing Expiration Date:   01/12/2021    Scheduling Instructions:     Procedure: Lumbar facet block (AKA.: Lumbosacral medial branch nerve block)     Side: Bilateral     Level: L3-4, L4-5, & L5-S1 Facets (L3, L4, L5, & S1 Medial Branch Nerves)     Sedation: Patient's choice.  Timeframe: ASAA    Order Specific Question:   Where will this procedure be performed?    Answer:   [facility]   Follow-up plan:   Return in about 11 days (around 12/24/2020) for B/L L2-L5 Fcts with sedation.     Status post L2, L3, L4, L5 facet medial branch nerve block on 03/21/2019: Helpful.  Status post right shoulder steroid injection #2 on 03/09/2019 which was not helpful, having worsening shoulder pain.  Consider sprint axillary nerve peripheral stimulation, consider lumbar medial branch peripheral nerve stimulation      Recent Visits No visits were found meeting these conditions. Showing recent visits within past 90 days and meeting all other requirements Today's Visits Date Type Provider Dept  12/13/20 Office Visit Gillis Santa, MD [facility]  Showing today's visits and meeting all other requirements Future Appointments No visits were found meeting these conditions. Showing future appointments within next 90 days and meeting all other requirements  I discussed the assessment and treatment plan  with the patient. The patient was provided an opportunity to ask questions and all were answered. The patient agreed with the plan and demonstrated an understanding of the instructions.  Patient advised to call back or seek an in-person evaluation if the symptoms or condition worsens.  Duration of encounter: 30 minutes.  Note by: Gillis Santa, MD Date: 12/13/2020; Time: 12:58 PM

## 2021-01-09 ENCOUNTER — Other Ambulatory Visit: Payer: Self-pay

## 2021-01-09 DIAGNOSIS — I10 Essential (primary) hypertension: Secondary | ICD-10-CM

## 2021-01-09 DIAGNOSIS — E1142 Type 2 diabetes mellitus with diabetic polyneuropathy: Secondary | ICD-10-CM

## 2021-01-09 MED ORDER — ENALAPRIL MALEATE 20 MG PO TABS: 20.0000 mg | ORAL_TABLET | Freq: Two times a day (BID) | ORAL | 0 refills | Status: DC

## 2021-01-09 MED ORDER — AMLODIPINE BESYLATE 10 MG PO TABS: 1.0000 | ORAL_TABLET | Freq: Every day | ORAL | 0 refills | Status: DC

## 2021-01-16 ENCOUNTER — Other Ambulatory Visit: Payer: Self-pay

## 2021-01-16 ENCOUNTER — Ambulatory Visit (HOSPITAL_BASED_OUTPATIENT_CLINIC_OR_DEPARTMENT_OTHER): Payer: 59 | Admitting: Student in an Organized Health Care Education/Training Program

## 2021-01-16 ENCOUNTER — Encounter: Payer: Self-pay | Admitting: Student in an Organized Health Care Education/Training Program

## 2021-01-16 ENCOUNTER — Ambulatory Visit
Admission: RE | Admit: 2021-01-16 | Discharge: 2021-01-16 | Disposition: A | Discharge status: Home / Self Care | Payer: 59 | Source: Ambulatory Visit | Attending: Student in an Organized Health Care Education/Training Program | Admitting: Student in an Organized Health Care Education/Training Program

## 2021-01-16 DIAGNOSIS — M47816 Spondylosis without myelopathy or radiculopathy, lumbar region: Secondary | ICD-10-CM | POA: Diagnosis present

## 2021-01-16 DIAGNOSIS — G894 Chronic pain syndrome: Secondary | ICD-10-CM | POA: Insufficient documentation

## 2021-01-16 MED ORDER — LIDOCAINE HCL 2 % IJ SOLN
INTRAMUSCULAR | Status: AC
  Filled 2021-01-16: qty 20

## 2021-01-16 MED ORDER — ROPIVACAINE HCL 2 MG/ML IJ SOLN
9.0000 mL | Freq: Once | INTRAMUSCULAR | Status: AC
  Administered 2021-01-16: 10 mL via PERINEURAL

## 2021-01-16 MED ORDER — DEXAMETHASONE SODIUM PHOSPHATE 10 MG/ML IJ SOLN
10.0000 mg | Freq: Once | INTRAMUSCULAR | Status: AC
  Administered 2021-01-16: 10 mg

## 2021-01-16 MED ORDER — FENTANYL CITRATE (PF) 100 MCG/2ML IJ SOLN
INTRAMUSCULAR | Status: AC
  Filled 2021-01-16: qty 2

## 2021-01-16 MED ORDER — LIDOCAINE HCL 2 % IJ SOLN
20.0000 mL | Freq: Once | INTRAMUSCULAR | Status: AC
  Administered 2021-01-16: 400 mg

## 2021-01-16 MED ORDER — DEXAMETHASONE SODIUM PHOSPHATE 10 MG/ML IJ SOLN
20.0000 mg | Freq: Once | INTRAMUSCULAR | Status: AC
  Administered 2021-01-16: 10 mg

## 2021-01-16 MED ORDER — DEXAMETHASONE SODIUM PHOSPHATE 10 MG/ML IJ SOLN
INTRAMUSCULAR | Status: AC
  Filled 2021-01-16: qty 2

## 2021-01-16 MED ORDER — ROPIVACAINE HCL 2 MG/ML IJ SOLN
INTRAMUSCULAR | Status: AC
  Filled 2021-01-16: qty 20

## 2021-01-16 MED ORDER — FENTANYL CITRATE (PF) 100 MCG/2ML IJ SOLN
25.0000 ug | INTRAMUSCULAR | Status: DC | PRN
  Administered 2021-01-16: 50 ug via INTRAVENOUS

## 2021-01-16 NOTE — Progress Notes (Signed)
Patient's Name: Stacy Martinez  MRN: [id number]  Referring Provider: Gillis Santa, MD  DOB: 1955/12/03  PCP: Verl Bangs, FNP  DOS: 01/16/2021  Note by: Gillis Santa, MD  Service setting: Ambulatory outpatient  Specialty: Interventional Pain Management  Patient type: Established  Location: [facility] (AMB) Pain Management Facility  Visit type: Interventional Procedure   Primary Reason for Visit: Interventional Pain Management Treatment. CC: Back Pain and Knee Pain (right)   Procedure:       Anesthesia, Analgesia, Anxiolysis:  Type: Lumbar Facet, Medial Branch Block(s) #1 in 2022  Primary Purpose: Therapeutic Region: Posterolateral Lumbosacral Spine Level:  L3, L4, L5,Medial Branch Level(s). Injecting these levels blocks the L3-4, L4-5,  lumbar facet joints. Laterality: Bilateral  Type: Moderate (Conscious) Sedation combined with Local Anesthesia Indication(s): Analgesia and Anxiety Route: Intravenous (IV) IV Access: Secured Sedation: Meaningful verbal contact was maintained at all times during the procedure  Local Anesthetic: Lidocaine 2%   Indications: 1. Facet syndrome, lumbar   2. Spondylosis without myelopathy or radiculopathy, lumbar region   3. Lumbar spondylosis   4. Chronic pain syndrome    Pain Score: Pre-procedure: 9 /10 Post-procedure: 0-No pain (standing and walking to wheelchair)/10  Pre-op Assessment:  Stacy Martinez is a 65 y.o. (year old), female patient, seen today for interventional treatment. 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 Stacy Martinez has a current medication list which includes the following prescription(s): amlodipine, carvedilol, diclofenac, enalapril, furosemide, gabapentin, relion glucose test strips, insulin glargine, jardiance, relion ultra thin lancets, rosuvastatin, sitagliptin,  tizanidine, ulticare mini pen needles, and acetaminophen, and the following Facility-Administered Medications: fentanyl. Her primarily concern today is the Back Pain and Knee Pain (right)  Initial Vital Signs:  Pulse/HCG Rate: 78ECG Heart Rate: 77 Temp: (!) 97.2 F (36.2 C) Resp: 18 BP: 134/72 SpO2: 99 %  BMI: Estimated body mass index is 43.9 kg/m as calculated from the following:   Height as of this encounter: 5\' 2"  (1.575 m).   Weight as of this encounter: 240 lb (108.9 kg).  Risk Assessment: Allergies: Reviewed. She is allergic to prednisone and metformin and related.  Allergy Precautions: None required Coagulopathies: Reviewed. None identified.  Blood-thinner therapy: None at this time Active Infection(s): Reviewed. None identified. [name] is afebrile  Site Confirmation: Stacy Martinez was asked to confirm the procedure and laterality before marking the site Procedure checklist: Completed Consent: Before the procedure and under the influence of no sedative(s), amnesic(s), or anxiolytics, the patient was informed of the treatment options, risks and possible complications. To fulfill our ethical and legal obligations, as recommended by the American Medical Association's Code of Ethics, I have informed the patient of my clinical impression; the nature and purpose of the treatment or procedure; the risks, benefits, and possible complications of the intervention; the alternatives, including doing nothing; the risk(s) and benefit(s) of the alternative treatment(s) or procedure(s); and the risk(s) and benefit(s) of doing nothing. The patient was provided information about the general risks and possible complications associated with the procedure. These may include, but are not limited to: failure to achieve desired goals, infection, bleeding, organ or nerve damage, allergic reactions, paralysis, and death. In addition, the patient was informed of those risks and complications associated to  Spine-related procedures, such as failure to decrease pain; infection (i.e.: Meningitis, epidural or intraspinal abscess); bleeding (i.e.: epidural hematoma, subarachnoid hemorrhage, or any  other type of intraspinal or peri-dural bleeding); organ or nerve damage (i.e.: Any type of peripheral nerve, nerve root, or spinal cord injury) with subsequent damage to sensory, motor, and/or autonomic systems, resulting in permanent pain, numbness, and/or weakness of one or several areas of the body; allergic reactions; (i.e.: anaphylactic reaction); and/or death. Furthermore, the patient was informed of those risks and complications associated with the medications. These include, but are not limited to: allergic reactions (i.e.: anaphylactic or anaphylactoid reaction(s)); adrenal axis suppression; blood sugar elevation that in diabetics may result in ketoacidosis or comma; water retention that in patients with history of congestive heart failure may result in shortness of breath, pulmonary edema, and decompensation with resultant heart failure; weight gain; swelling or edema; medication-induced neural toxicity; particulate matter embolism and blood vessel occlusion with resultant organ, and/or nervous system infarction; and/or aseptic necrosis of one or more joints. Finally, the patient was informed that Medicine is not an exact science; therefore, there is also the possibility of unforeseen or unpredictable risks and/or possible complications that may result in a catastrophic outcome. The patient indicated having understood very clearly. We have given the patient no guarantees and we have made no promises. Enough time was given to the patient to ask questions, all of which were answered to the patient's satisfaction. [name] has indicated that she wanted to continue with the procedure. Attestation: I, the ordering provider, attest that I have discussed with the patient the benefits, risks, side-effects, alternatives,  likelihood of achieving goals, and potential problems during recovery for the procedure that I have provided informed consent. Date  Time: 01/16/2021 10:57 AM  Pre-Procedure Preparation:  Monitoring: As per clinic protocol. Respiration, ETCO2, SpO2, BP, heart rate and rhythm monitor placed and checked for adequate function Safety Precautions: Patient was assessed for positional comfort and pressure points before starting the procedure. Time-out: I initiated and conducted the "Time-out" before starting the procedure, as per protocol. The patient was asked to participate by confirming the accuracy of the "Time Out" information. Verification of the correct person, site, and procedure were performed and confirmed by me, the nursing staff, and the patient. "Time-out" conducted as per Joint Commission's Universal Protocol (UP.01.01.01). Time: 1126  Description of Procedure:       Position: Prone Laterality: Bilateral. The procedure was performed in identical fashion on both sides. Levels:   L3, L4, L5, Medial Branch Level(s) Area Prepped: Posterior Lumbosacral Region Prepping solution: ChloraPrep (2% chlorhexidine gluconate and 70% isopropyl alcohol) Safety Precautions: Aspiration looking for blood return was conducted prior to all injections. At no point did we inject any substances, as a needle was being advanced. Before injecting, the patient was told to immediately notify me if she was experiencing any new onset of "ringing in the ears, or metallic taste in the mouth". No attempts were made at seeking any paresthesias. Safe injection practices and needle disposal techniques used. Medications properly checked for expiration dates. SDV (single dose vial) medications used. After the completion of the procedure, all disposable equipment used was discarded in the proper designated medical waste containers. Local Anesthesia: Protocol guidelines were followed. The patient was positioned over the fluoroscopy  table. The area was prepped in the usual manner. The time-out was completed. The target area was identified using fluoroscopy. A 12-in long, straight, sterile hemostat was used with fluoroscopic guidance to locate the targets for each level blocked. Once located, the skin was marked with an approved surgical skin marker. Once all sites were marked, the skin (  epidermis, dermis, and hypodermis), as well as deeper tissues (fat, connective tissue and muscle) were infiltrated with a small amount of a short-acting local anesthetic, loaded on a 10cc syringe with a 25G, 1.5-in  Needle. An appropriate amount of time was allowed for local anesthetics to take effect before proceeding to the next step. Local Anesthetic: Lidocaine 2.0% The unused portion of the local anesthetic was discarded in the proper designated containers.  Technical explanation of process:   L3 Medial Branch Nerve Block (MBB): The target area for the L3 medial branch is at the junction of the postero-lateral aspect of the superior articular process and the superior, posterior, and medial edge of the transverse process of L4. Under fluoroscopic guidance, a Quincke needle was inserted until contact was made with os over the superior postero-lateral aspect of the pedicular shadow (target area). After negative aspiration for blood, 64mL of the nerve block solution was injected without difficulty or complication. The needle was removed intact. L4 Medial Branch Nerve Block (MBB): The target area for the L4 medial branch is at the junction of the postero-lateral aspect of the superior articular process and the superior, posterior, and medial edge of the transverse process of L5. Under fluoroscopic guidance, a Quincke needle was inserted until contact was made with os over the superior postero-lateral aspect of the pedicular shadow (target area). After negative aspiration for blood, 1 mL of the nerve block solution was injected without difficulty or  complication. The needle was removed intact. L5 Medial Branch Nerve Block (MBB): The target area for the L5 medial branch is at the junction of the postero-lateral aspect of the superior articular process and the superior, posterior, and medial edge of the sacral ala. Under fluoroscopic guidance, a Quincke needle was inserted until contact was made with os over the superior postero-lateral aspect of the pedicular shadow (target area). After negative aspiration for blood,1  mL of the nerve block solution was injected without difficulty or complication. The needle was removed intact.  Procedural Needles: 22-gauge, 3.5-inch, Quincke needles used for all levels. Nerve block solution: 10 cc solution made of 8 cc of 0.2% ropivacaine, 2 cc of Decadron 10 mg/cc.  1 to 1.5 cc injected at each level bilaterally.  The unused portion of the solution was discarded in the proper designated containers.  Once the entire procedure was completed, the treated area was cleaned, making sure to leave some of the prepping solution back to take advantage of its long term bactericidal properties.   Illustration of the posterior view of the lumbar spine and the posterior neural structures. Laminae of L2 through S1 are labeled. DPRL5, dorsal primary ramus of L5; DPRS1, dorsal primary ramus of S1; DPR3, dorsal primary ramus of L3; FJ, facet (zygapophyseal) joint L3-L4; I, inferior articular process of L4; LB1, lateral branch of dorsal primary ramus of L1; IAB, inferior articular branches from L3 medial branch (supplies L4-L5 facet joint); IBP, intermediate branch plexus; MB3, medial branch of dorsal primary ramus of L3; NR3, third lumbar nerve root; S, superior articular process of L5; SAB, superior articular branches from L4 (supplies L4-5 facet joint also); TP3, transverse process of L3.  Vitals:   01/16/21 1134 01/16/21 1139 01/16/21 1149 01/16/21 1159  BP: (!) 104/47 (!) 108/50 138/66 134/64  Pulse:   68 66  Resp: 12 13 16  16   Temp:      SpO2: 94% 95% 97% 98%  Weight:      Height:  Start Time: 1127 hrs. End Time: 1139 hrs.  Imaging Guidance (Spinal):  Type of Imaging Technique: Fluoroscopy Guidance (Spinal) Indication(s): Assistance in needle guidance and placement for procedures requiring needle placement in or near specific anatomical locations not easily accessible without such assistance. Exposure Time: Please see nurses notes. Contrast: None used. Fluoroscopic Guidance: I was personally present during the use of fluoroscopy. "Tunnel Vision Technique" used to obtain the best possible view of the target area. Parallax error corrected before commencing the procedure. "Direction-depth-direction" technique used to introduce the needle under continuous pulsed fluoroscopy. Once target was reached, antero-posterior, oblique, and lateral fluoroscopic projection used confirm needle placement in all planes. Images permanently stored in EMR. Interpretation: No contrast injected. I personally interpreted the imaging intraoperatively. Adequate needle placement confirmed in multiple planes. Permanent images saved into the patient's record.  Post-operative Assessment:  Post-procedure Vital Signs:  Pulse/HCG Rate: 66 (nsr)67 Temp: (!) 97.2 F (36.2 C) Resp: 16 BP: 134/64 SpO2: 98 %  EBL: None  Complications: No immediate post-treatment complications observed by team, or reported by patient.  Note: The patient tolerated the entire procedure well. A repeat set of vitals were taken after the procedure and the patient was kept under observation following institutional policy, for this type of procedure. Post-procedural neurological assessment was performed, showing return to baseline, prior to discharge. The patient was provided with post-procedure discharge instructions, including a section on how to identify potential problems. Should any problems arise concerning this procedure, the patient was given  instructions to immediately contact us, at any time, without hesitation. In any case, we plan to contact the patient by telephone for a follow-up status report regarding this interventional procedure.  Comments:  No additional relevant information. 5 out of 5 strength bilateral lower extremity: Plantar flexion, dorsiflexion, knee flexion, knee extension.  Plan of Care    Imaging Orders     DG PAIN CLIN[facility] C-ARM 1-60 MIN NO REPORT   Medications ordered for procedure: Meds ordered this encounter  Medications  . lidocaine (XYLOCAINE) 2 % (with pres) injection 400 mg  . fentaNYL (SUBLIMAZE) injection 25-50 mcg    Make sure Narcan is available in the pyxis when using this medication. In the event of respiratory depression (RR< 8/min): Titrate NARCAN (naloxone) in increments of 0.1 to 0.2 mg IV at 2-3 minute intervals, until desired degree of reversal.  . ropivacaine (PF) 2 mg/mL (0.2%) (NAROPIN) injection 9 mL  . dexamethasone (DECADRON) injection 20 mg  . dexamethasone (DECADRON) injection 10 mg  . ropivacaine (PF) 2 mg/mL (0.2%) (NAROPIN) injection 9 mL   Medications administered: We administered lidocaine, fentaNYL, ropivacaine (PF) 2 mg/mL (0.2%), dexamethasone, dexamethasone, and ropivacaine (PF) 2 mg/mL (0.2%).  See the medical record for exact dosing, route, and time of administration.  New Prescriptions   No medications on file   Disposition: Discharge home  Discharge Date & Time: 01/16/2021; 1203 hrs.   Physician-requested Follow-up: Return in about 3 weeks (around 02/06/2021) for Post Procedure Evaluation, virtual.  Future Appointments  Date Time Provider [place]  01/18/2021  1:40 PM Jearld Fenton, NP [place]  02/06/2021  3:00 PM Gillis Santa, MD [facility] None   Primary Care Physician: Verl Bangs, FNP Location: [facility] Note by: Gillis Santa, MD Date: 01/16/2021; Time: 12:45 PM  Disclaimer:  Medicine is not an exact  science. The only guarantee in medicine is that nothing is guaranteed. It is important to note that the decision to proceed with this intervention  was based on the information collected from the patient. The Data and conclusions were drawn from the patient's questionnaire, the interview, and the physical examination. Because the information was provided in large part by the patient, it cannot be guaranteed that it has not been purposely or unconsciously manipulated. Every effort has been made to obtain as much relevant data as possible for this evaluation. It is important to note that the conclusions that lead to this procedure are derived in large part from the available data. Always take into account that the treatment will also be dependent on availability of resources and existing treatment guidelines, considered by other Pain Management Practitioners as being common knowledge and practice, at the time of the intervention. For Medico-Legal purposes, it is also important to point out that variation in procedural techniques and pharmacological choices are the acceptable norm. The indications, contraindications, technique, and results of the above procedure should only be interpreted and judged by a Board-Certified Interventional Pain Specialist with extensive familiarity and expertise in the same exact procedure and technique.

## 2021-01-16 NOTE — Progress Notes (Signed)
Safety precautions to be maintained throughout the outpatient stay will include: orient to surroundings, keep bed in low position, maintain call bell within reach at all times, provide assistance with transfer out of bed and ambulation.  

## 2021-01-17 ENCOUNTER — Telehealth: Payer: Self-pay | Admitting: *Deleted

## 2021-01-17 NOTE — Telephone Encounter (Signed)
Attempted to call for post procedure follow-up. No answer, unable to leave a message. 

## 2021-01-18 ENCOUNTER — Ambulatory Visit (INDEPENDENT_AMBULATORY_CARE_PROVIDER_SITE_OTHER): Payer: 59 | Admitting: Internal Medicine

## 2021-01-18 ENCOUNTER — Encounter: Payer: Self-pay | Admitting: Internal Medicine

## 2021-01-18 ENCOUNTER — Other Ambulatory Visit: Payer: Self-pay

## 2021-01-18 VITALS — BP 123/55 | HR 63 | Temp 97.9°F | Ht 62.0 in | Wt 252.6 lb

## 2021-01-18 DIAGNOSIS — E1142 Type 2 diabetes mellitus with diabetic polyneuropathy: Secondary | ICD-10-CM | POA: Diagnosis not present

## 2021-01-18 DIAGNOSIS — E785 Hyperlipidemia, unspecified: Secondary | ICD-10-CM

## 2021-01-18 DIAGNOSIS — I10 Essential (primary) hypertension: Secondary | ICD-10-CM

## 2021-01-18 DIAGNOSIS — M4696 Unspecified inflammatory spondylopathy, lumbar region: Secondary | ICD-10-CM | POA: Insufficient documentation

## 2021-01-18 DIAGNOSIS — I429 Cardiomyopathy, unspecified: Secondary | ICD-10-CM

## 2021-01-18 DIAGNOSIS — D473 Essential (hemorrhagic) thrombocythemia: Secondary | ICD-10-CM | POA: Insufficient documentation

## 2021-01-18 DIAGNOSIS — E1169 Type 2 diabetes mellitus with other specified complication: Secondary | ICD-10-CM

## 2021-01-18 DIAGNOSIS — M1990 Unspecified osteoarthritis, unspecified site: Secondary | ICD-10-CM

## 2021-01-18 LAB — POCT GLYCOSYLATED HEMOGLOBIN (HGB A1C): Hemoglobin A1C: 7.8 % — AB (ref 4.0–5.6)

## 2021-01-18 MED ORDER — AMLODIPINE BESYLATE 10 MG PO TABS: 1.0000 | ORAL_TABLET | Freq: Every day | ORAL | 0 refills | Status: DC

## 2021-01-18 MED ORDER — ENALAPRIL MALEATE 20 MG PO TABS: 20.0000 mg | ORAL_TABLET | Freq: Two times a day (BID) | ORAL | 0 refills | Status: DC

## 2021-01-18 MED ORDER — ULTICARE MINI PEN NEEDLES 32G X 6 MM MISC: 1.0000 | 1 refills | Status: AC

## 2021-01-18 MED ORDER — GABAPENTIN 300 MG PO CAPS: 300.0000 mg | ORAL_CAPSULE | Freq: Every day | ORAL | 0 refills | Status: DC

## 2021-01-18 MED ORDER — INSULIN GLARGINE 100 UNIT/ML ~~LOC~~ SOLN: 32.0000 [IU] | Freq: Every day | SUBCUTANEOUS | 1 refills | Status: DC

## 2021-01-18 NOTE — Assessment & Plan Note (Signed)
We will check c-Met and lipid profile at annual exam Encouraged her to consume a low-fat diet Continue Rosuvastatin

## 2021-01-18 NOTE — Assessment & Plan Note (Signed)
Continue Amlodipine, Carvedilol, Enalapril and Furosemide Reinforced DASH diet and exercise for weight loss We will check c-Met at annual exam

## 2021-01-18 NOTE — Progress Notes (Signed)
Subjective:    Patient ID: Stacy Martinez, female    DOB: 10-Apr-1956, 65 y.o.   MRN: [id number]  HPI  Pt presents to the clinic today for follow up of chronic conditions. She is establishing care with me today, transferring care from Cyndia Skeeters, NP.  HTN with Cardiomyopathy: Her BP today is 123/55. She is taking Amlodipine, Enalapril, Carvedilol and Furosemide as prescribed. ECG from 08/2020 reviewed.  HLD: Her last LDL was 137, triglycerides 56, 03/2019. She denies myalgias on Rosuvastatin. She tries to  consume a low fat diet.   DM 2 with Peripheral Neuropathy: Her last A1C was 7.4%, 08/2020. She is taking Jardience, Januvia and Lantus as prescribed. Her sugars range 100-140. She takes Gabapentin for her neuropathy but reports she feels like this needs to be increased and checks her feet daily. Her last eye exam was 11/2020, [place]. Flu 06/2019. Pneumovax 06/2019. Covid- never.  OA: Mainly in her right shoulder, back, hips and knees. She has had right shoulder replacement. She recently had an ESI injection. She takes Voltaren, Gabapentin and Zanaflex as prescribed with some relief of symptoms. She follows with Dr. Holley Raring.  Review of Systems      Past Medical History:  Diagnosis Date  . Allergy   . Arthritis   . Diabetes mellitus without complication ([place])   . Enlarged heart   . Hypertension     Current Outpatient Medications  Medication Sig Dispense Refill  . acetaminophen (TYLENOL) 500 MG tablet Take 2 tablets (1,000 mg total) by mouth every 8 (eight) hours. (Patient not taking: No sig reported) 90 tablet 2  . amLODipine (NORVASC) 10 MG tablet Take 1 tablet (10 mg total) by mouth daily. 90 tablet 0  . carvedilol (COREG) 25 MG tablet TAKE 1 TABLET BY MOUTH TWICE DAILY WITH A MEAL 180 tablet 0  . diclofenac (VOLTAREN) 75 MG EC tablet Take 1 tablet (75 mg total) by mouth daily as needed for moderate pain. 30 tablet 5  . enalapril (VASOTEC) 20 MG tablet Take 1 tablet  (20 mg total) by mouth 2 (two) times daily. 180 tablet 0  . furosemide (LASIX) 40 MG tablet Take 2 tablets (80 mg total) by mouth daily. 180 tablet 0  . gabapentin (NEURONTIN) 100 MG capsule Take 1 capsule by mouth at bedtime 90 capsule 2  . glucose blood (RELION GLUCOSE TEST STRIPS) test strip Use as instructed 100 each 12  . insulin glargine (LANTUS) 100 UNIT/ML injection Inject 32 Units into the skin at bedtime.    Marland Kitchen JARDIANCE 25 MG TABS tablet Take 1 tablet by mouth once daily 30 tablet 0  . ReliOn Ultra Thin Lancets MISC 1 each by Does not apply route daily. 100 each 11  . rosuvastatin (CRESTOR) 20 MG tablet Take 1 tablet (20 mg total) by mouth daily. (Patient taking differently: Take 20 mg by mouth at bedtime.) 90 tablet 1  . sitaGLIPtin (JANUVIA) 100 MG tablet Take 1 tablet by mouth once daily 90 tablet 0  . tiZANidine (ZANAFLEX) 4 MG tablet Take 1-1.5 tablets (4-6 mg total) by mouth at bedtime as needed for muscle spasms. 45 tablet 2  . ULTICARE MINI PEN NEEDLES 32G X 6 MM MISC 1 Bottle by Does not apply route as directed. 100 each 1   No current facility-administered medications for this visit.    Allergies  Allergen Reactions  . Prednisone Swelling  . Metformin And Related     Severe nausea and diarrhea even with  extended release versions    Family History  Problem Relation Age of Onset  . Diabetes Mother   . Cancer Father   . Mental illness Son     Social History   Socioeconomic History  . Marital status: Divorced    Spouse name: Not on file  . Number of children: 4  . Years of education: Not on file  . Highest education level: Not on file  Occupational History  . Not on file  Tobacco Use  . Smoking status: Never Smoker  . Smokeless tobacco: Never Used  Vaping Use  . Vaping Use: Never used  Substance and Sexual Activity  . Alcohol use: No    Alcohol/week: 0.0 standard drinks  . Drug use: No  . Sexual activity: Not on file  Other Topics Concern  . Not on  file  Social History Narrative  . Not on file   Social Determinants of Health   Financial Resource Strain: Not on file  Food Insecurity: Not on file  Transportation Needs: Not on file  Physical Activity: Not on file  Stress: Not on file  Social Connections: Not on file  Intimate Partner Violence: Not on file     Constitutional: Denies fever, malaise, fatigue, headache or abrupt weight changes.  Respiratory: Denies difficulty breathing, shortness of breath, cough or sputum production.   Cardiovascular: Denies chest pain, chest tightness, palpitations or swelling in the hands or feet.  Gastrointestinal: Denies abdominal pain, bloating, constipation, diarrhea or blood in the stool.  GU: Denies urgency, frequency, pain with urination, burning sensation, blood in urine, odor or discharge. Musculoskeletal: Pt reports chronic joint pain. Denies decrease in range of motion, difficulty with gait, muscle pain or joint swelling.  Skin: Denies redness, rashes, lesions or ulcercations.  Neurological: Pt reports neuropathic pain in feet. Denies dizziness, difficulty with memory, difficulty with speech or problems with balance and coordination.  Psych: Denies anxiety, depression, SI/HI.  No other specific complaints in a complete review of systems (except as listed in HPI above).  Objective:   Physical Exam   BP (!) 123/55 (BP Location: Right Arm, Patient Position: Sitting, Cuff Size: Large)   Pulse 63   Temp 97.9 F (36.6 C) (Temporal)   Ht 5\' 2"  (1.575 m)   Wt 252 lb 9.6 oz (114.6 kg)   SpO2 100%   BMI 46.20 kg/m   Wt Readings from Last 3 Encounters:  01/16/21 240 lb (108.9 kg)  12/13/20 230 lb (104.3 kg)  08/27/20 250 lb (113.4 kg)    General: Appears her stated age, obese, in NAD. Skin: Warm, dry and intact. No ulcerations noted. HEENT: Head: normal shape and size; Eyes: sclera white and EOMs intact; Cardiovascular: Normal rate and rhythm. S1,S2 noted.  No murmur, rubs or  gallops noted. No JVD or BLE edema. No carotid bruits noted. Pulmonary/Chest: Normal effort and positive vesicular breath sounds. No respiratory distress. No wheezes, rales or ronchi noted.  Musculoskeletal: Strength 5/5 BUE/BLE. No difficulty with gait.  Neurological: Alert and oriented. Sensation intact but decreased in BLE. Psychiatric: Mood and affect normal. Behavior is normal. Judgment and thought content normal.     BMET    Component Value Date/Time   NA 141 08/21/2020 1137   NA 136 11/30/2015 1037   K 4.0 08/21/2020 1137   CL 105 08/21/2020 1137   CO2 26 08/21/2020 1137   GLUCOSE 141 (H) 08/21/2020 1137   BUN 18 08/21/2020 1137   BUN 19 11/30/2015 1037   CREATININE  0.67 08/21/2020 1137   CREATININE 0.73 03/22/2019 0909   CALCIUM 9.1 08/21/2020 1137   GFRNONAA >60 08/21/2020 1137   GFRNONAA 88 03/22/2019 0909   GFRAA 102 03/22/2019 0909    Lipid Panel     Component Value Date/Time   CHOL 218 (H) 03/22/2019 0909   CHOL 182 11/30/2015 1037   TRIG 125 03/22/2019 0909   HDL 56 03/22/2019 0909   HDL 47 11/30/2015 1037   CHOLHDL 3.9 03/22/2019 0909   VLDL 33 (H) 04/08/2017 0857   LDLCALC 137 (H) 03/22/2019 0909    CBC    Component Value Date/Time   WBC 10.7 (H) 08/21/2020 1137   RBC 4.89 08/21/2020 1137   HGB 12.8 08/21/2020 1137   HGB 12.2 11/30/2015 1037   HCT 40.7 08/21/2020 1137   HCT 38.0 11/30/2015 1037   PLT 481 (H) 08/21/2020 1137   PLT 428 (H) 11/30/2015 1037   MCV 83.2 08/21/2020 1137   MCV 83 11/30/2015 1037   MCH 26.2 08/21/2020 1137   MCHC 31.4 08/21/2020 1137   RDW 14.6 08/21/2020 1137   RDW 15.1 11/30/2015 1037   LYMPHSABS 2.2 08/21/2020 1137   LYMPHSABS 3.1 11/30/2015 1037   MONOABS 0.7 08/21/2020 1137   EOSABS 0.8 (H) 08/21/2020 1137   EOSABS 0.4 11/30/2015 1037   BASOSABS 0.1 08/21/2020 1137   BASOSABS 0.1 11/30/2015 1037    Hgb A1C Lab Results  Component Value Date   HGBA1C 7.4 (A) 08/07/2020           Assessment & Plan:     Webb Silversmith, NP This visit occurred during the SARS-CoV-2 public health emergency.  Safety protocols were in place, including screening questions prior to the visit, additional usage of staff PPE, and extensive cleaning of exam room while observing appropriate contact time as indicated for disinfecting solutions.

## 2021-01-18 NOTE — Patient Instructions (Signed)

## 2021-01-18 NOTE — Assessment & Plan Note (Signed)
Deteriorated Will increase Gabapentin to 300 mg nightly, Rx sent to pharmacy

## 2021-01-18 NOTE — Assessment & Plan Note (Addendum)
Multiple joints Continue Voltaren, Gabapentin and Zanaflex Will increase Gabapentin to 300 mg nightly Encouraged weight loss as this can help reduce joint pain She will continue to follow-up with Dr. Holley Raring

## 2021-01-18 NOTE — Addendum Note (Signed)
Addended by: Wilson Singer on: 01/18/2021 02:10 PM   Modules accepted: Orders

## 2021-01-18 NOTE — Assessment & Plan Note (Addendum)
POCT A1c 7.8% No urine microalbumin secondary to ACEI therapy Encouraged her to consume a low-carb diet Continue Jardiance, Januvia and Lantus Will increase gabapentin to 300 mg nightly Will request copy of eye exam Foot exam today Encouraged her to get a flu shot in fall Pneumovax UTD Encouraged her to get her COVID-vaccine

## 2021-02-06 ENCOUNTER — Other Ambulatory Visit: Payer: Self-pay

## 2021-02-06 ENCOUNTER — Encounter: Payer: Self-pay | Admitting: Student in an Organized Health Care Education/Training Program

## 2021-02-06 ENCOUNTER — Ambulatory Visit
Payer: 59 | Attending: Student in an Organized Health Care Education/Training Program | Admitting: Student in an Organized Health Care Education/Training Program

## 2021-02-06 DIAGNOSIS — M47816 Spondylosis without myelopathy or radiculopathy, lumbar region: Secondary | ICD-10-CM

## 2021-02-06 DIAGNOSIS — G894 Chronic pain syndrome: Secondary | ICD-10-CM

## 2021-02-06 NOTE — Progress Notes (Signed)
Patient: Stacy Martinez  Service Category: E/M  Provider: Gillis Santa, MD  DOB: [birth year]  DOS: 02/06/2021  Location: Office  MRN: [id number]  Setting: Ambulatory outpatient  Referring Provider: Verl Bangs, FNP  Type: Established Patient  Specialty: Interventional Pain Management  PCP: Jearld Fenton, NP  Location: Home  Delivery: TeleHealth     Virtual Encounter - Pain Management PROVIDER NOTE: Information contained herein reflects review and annotations entered in association with encounter. Interpretation of such information and data should be left to medically-trained personnel. Information provided to patient can be located elsewhere in the medical record under "Patient Instructions". Document created using STT-dictation technology, any transcriptional errors that may result from process are unintentional.    Contact & Pharmacy Preferred: [phone] Home: [phone] (home) Mobile: [phone] (mobile) E-mail: [email or web address], [place] [address] [address] Phone: [phone] Fax: [phone]   Pre-screening  [name] offered "in-person" vs "virtual" encounter. She indicated preferring virtual for this encounter.   Reason COVID-19*  Social distancing based on CDC and AMA recommendations.   I contacted Gaile Allmon Zervas on 02/06/2021 via video conference.      I clearly identified myself as Gillis Santa, MD. I verified that I was speaking with the correct person using two identifiers (Name: Stacy Martinez, and date of birth: 1956-01-16).  Consent I sought verbal advanced consent from [name] for virtual visit interactions. I informed [name] of possible security and privacy concerns, risks, and limitations associated with providing "not-in-person" medical evaluation and management services. I also informed [name] of the availability of "in-person" appointments. Finally, I informed her that there would be a charge for  the virtual visit and that she could be  personally, fully or partially, financially responsible for it. Ms. Sandler expressed understanding and agreed to proceed.   Historic Elements   [name] is a 65 y.o. year old, female patient evaluated today after our last contact on 01/16/2021. [name]  has a past medical history of Allergy, Arthritis, Diabetes mellitus without complication ([place]), Enlarged heart, and Hypertension. She also  has a past surgical history that includes Wrist fracture surgery (Right, 2013); Cesarean section (1999, 2001); Hernia repair (2001, 2006); Fracture surgery; and Reverse shoulder arthroplasty (Right, 08/27/2020). [name] has a current medication list which includes the following prescription(s): amlodipine, carvedilol, diclofenac, enalapril, furosemide, gabapentin, relion glucose test strips, insulin glargine, jardiance, relion ultra thin lancets, rosuvastatin, sitagliptin, tizanidine, and ulticare mini pen needles. She  reports that she has never smoked. She has never used smokeless tobacco. She reports that she does not drink alcohol and does not use drugs. [name] is allergic to prednisone and metformin and related.   HPI  Today, she is being contacted for a post-procedure assessment.   Post-Procedure Evaluation  Procedure (01/16/2021):   Type: Lumbar Facet, Medial Branch Block(s) #1 in 2022  Primary Purpose: Therapeutic Region: Posterolateral Lumbosacral Spine Level:  L3, L4, L5,Medial Branch Level(s). Injecting these levels blocks the L3-4, L4-5,  lumbar facet joints. Laterality: Bilateral  Sedation: Please see nurses note.  Effectiveness during initial hour after procedure(Ultra-Short Term Relief): 100 %   Local anesthetic used: Long-acting (4-6 hours) Effectiveness: Defined as any analgesic benefit obtained secondary to the administration of local anesthetics. This carries significant diagnostic value as to the etiological location, or anatomical origin, of  the pain. Duration of benefit is expected to coincide with the duration of the  local anesthetic used.  Effectiveness during initial 4-6 hours after procedure(Short-Term Relief): 100 %  Long-term benefit: Defined as any relief past the pharmacologic duration of the local anesthetics.  Effectiveness past the initial 6 hours after procedure(Long-Term Relief): 100 % (good pain relief up until first of this week and was cleaning and now it is hurting again.)   Current benefits: Defined as benefit that persist at this time.   Analgesia:  <50% better Function: Somewhat improved    Laboratory Chemistry Profile   Renal Lab Results  Component Value Date   BUN 18 08/21/2020   CREATININE 0.67 08/21/2020   BCR 37 (H) 03/22/2019   GFRAA 102 03/22/2019   GFRNONAA >60 08/21/2020     Hepatic Lab Results  Component Value Date   AST 17 08/21/2020   ALT 17 08/21/2020   ALBUMIN 4.0 08/21/2020   ALKPHOS 100 08/21/2020   LIPASE 19 12/18/2013     Electrolytes Lab Results  Component Value Date   NA 141 08/21/2020   K 4.0 08/21/2020   CL 105 08/21/2020   CALCIUM 9.1 08/21/2020     Bone No results found for: VD25OH, VD125OH2TOT, UU8280KL4, JZ7915AV6, 25OHVITD1, 25OHVITD2, 25OHVITD3, TESTOFREE, TESTOSTERONE   Inflammation (CRP: Acute Phase) (ESR: Chronic Phase) Lab Results  Component Value Date   LATICACIDVEN 1.72 01/02/2014       Note: Above Lab results reviewed.   Assessment  The primary encounter diagnosis was Facet syndrome, lumbar. Diagnoses of Spondylosis without myelopathy or radiculopathy, lumbar region, Lumbar spondylosis, and Chronic pain syndrome were also pertinent to this visit.  Plan of Care   [name] has a current medication list which includes the following long-term medication(s): amlodipine, carvedilol, enalapril, furosemide, gabapentin, insulin glargine, rosuvastatin, and sitagliptin.   [name], has had a series of diagnostic/therapeutic lumbar facet medial  branch nerve blocks that have helped reduce her back pain by approximately 80% and has helped improve her functional status by allowing her to ambulate and perform ADLs more comfortably.  We have discussed lumbar radiofrequency ablation for the purpose of obtaining longer-term pain relief as well as utilizing modality that does not use steroid medications.  Patient is a diabetic so I feel strongly that radiofrequency ablation would be a better treatment option for her especially since he has demonstrated good pain relief as detailed above from her diagnostic lumbar facet medial branch nerve blocks.  Patient has completed a physician directed home PT regimen with limited response.  She did learn new stretching exercises but this has not helped with her low back pain.  Recommend starting off with right L3, L4, L5 RFA first followed by left.  Risks and benefits reviewed and patient would like to proceed.  Orders:  Orders Placed This Encounter  Procedures  . Radiofrequency,Lumbar    Standing Status:   Future    Standing Expiration Date:   02/06/2022    Scheduling Instructions:     Side(s): RIGHT     Level: L3-4, L4-5,  Facets (L3, L4, L5, Medial Branch Nerves)     Sedation: Patient's choice.     Scheduling Timeframe: As soon as pre-approved    Order Specific Question:   Where will this procedure be performed?    Answer:   [facility]  . Radiofrequency,Lumbar    Standing Status:   Future    Standing Expiration Date:   02/06/2022    Scheduling Instructions:     Side(s): Left-sided     Level: L3-4, L4-5,  Facets (  L3, L4, L5,Medial Branch Nerves)     Sedation: Patient's choice.     Scheduling Timeframe: 2 weeks after right    Order Specific Question:   Where will this procedure be performed?    Answer:   [facility]   Follow-up plan:   Return in about 2 weeks (around 02/20/2021) for R L3, 4, 5 RFA, with sedation.   Recent Visits Date Type Provider Dept  01/16/21 Procedure visit  Gillis Santa, MD [facility]  12/13/20 Office Visit Gillis Santa, MD [facility]  Showing recent visits within past 90 days and meeting all other requirements Today's Visits Date Type Provider Dept  02/06/21 Telemedicine Gillis Santa, MD [facility]  Showing today's visits and meeting all other requirements Future Appointments No visits were found meeting these conditions. Showing future appointments within next 90 days and meeting all other requirements  I discussed the assessment and treatment plan with the patient. The patient was provided an opportunity to ask questions and all were answered. The patient agreed with the plan and demonstrated an understanding of the instructions.  Patient advised to call back or seek an in-person evaluation if the symptoms or condition worsens.  Duration of encounter: 30 minutes.  Note by: Gillis Santa, MD Date: 02/06/2021; Time: 2:08 PM

## 2021-02-09 ENCOUNTER — Other Ambulatory Visit: Payer: Self-pay | Admitting: Family Medicine

## 2021-02-09 DIAGNOSIS — E1142 Type 2 diabetes mellitus with diabetic polyneuropathy: Secondary | ICD-10-CM

## 2021-02-09 NOTE — Telephone Encounter (Signed)
Requested medication (s) are due for refill today: yes  Requested medication (s) are on the active medication list: yes  Last refill:  10/10/20  Future visit scheduled: yes  Notes to clinic:  pt has initial appt in July. Please review    Requested Prescriptions  Pending Prescriptions Disp Refills   JANUVIA 100 MG tablet [Pharmacy Med Name: Januvia 100 MG Oral Tablet] 90 tablet 0    Sig: Take 1 tablet by mouth once daily      Endocrinology:  Diabetes - DPP-4 Inhibitors Passed - 02/09/2021 12:56 PM      Passed - HBA1C is between 0 and 7.9 and within 180 days    Hemoglobin A1C  Date Value Ref Range Status  01/18/2021 7.8 (A) 4.0 - 5.6 % Final  01/06/2017 8.0  Final   Hgb A1c MFr Bld  Date Value Ref Range Status  04/08/2017 7.9 (H) <5.7 % Final    Comment:      For someone without known diabetes, a hemoglobin A1c value of 6.5% or greater indicates that they may have diabetes and this should be confirmed with a follow-up test.   For someone with known diabetes, a value <7% indicates that their diabetes is well controlled and a value greater than or equal to 7% indicates suboptimal control. A1c targets should be individualized based on duration of diabetes, age, comorbid conditions, and other considerations.   Currently, no consensus exists for use of hemoglobin A1c for diagnosis of diabetes for children.             Passed - Cr in normal range and within 360 days    Creat  Date Value Ref Range Status  03/22/2019 0.73 0.50 - 0.99 mg/dL Final    Comment:    For patients >9 years of age, the reference limit for Creatinine is approximately 13% higher for people identified as African-American. .    Creatinine, Ser  Date Value Ref Range Status  08/21/2020 0.67 0.44 - 1.00 mg/dL Final          Passed - Valid encounter within last 6 months    Recent Outpatient Visits           3 weeks ago DM type 2 with diabetic peripheral neuropathy [facility])   [facility], Coralie Keens, NP   4 months ago [place], Lupita Raider, FNP   6 months ago DM type 2 with diabetic peripheral neuropathy [facility])   [facility], Lupita Raider, FNP   8 months ago Corneal abrasion of left eye due to contact lens   [place], FNP   1 year ago DM type 2 with diabetic peripheral neuropathy [facility])   [facility], Lupita Raider, FNP       Future Appointments             In 2 months Baity, Coralie Keens, NP [facility], [facility]

## 2021-02-21 ENCOUNTER — Other Ambulatory Visit: Payer: Self-pay

## 2021-02-21 MED ORDER — LANTUS SOLOSTAR 100 UNIT/ML ~~LOC~~ SOPN: 32.0000 [IU] | PEN_INJECTOR | Freq: Every day | SUBCUTANEOUS | 0 refills | Status: DC

## 2021-02-22 ENCOUNTER — Telehealth: Payer: Self-pay

## 2021-02-22 NOTE — Telephone Encounter (Signed)
Copied from [place] [phone]. Topic: General - Other >> Feb 19, 2021 12:14 PM Loma Boston wrote: insulin glargine (LANTUS) 100 UNIT/ML injection 10 mL 1 01/18/2021  was supposed to be PEN. Also she had a couple question on other med, pt just switched to Bier and wants to FU to make sure Meds are all correct on dr's end. FU with Otila Kluver at [phone] [facility] >> Feb 21, 2021 11:00 AM Lennox Solders wrote: Pt is calling back checking on lantus solostar pens. Pt states she does not get the vials

## 2021-02-25 ENCOUNTER — Ambulatory Visit (HOSPITAL_BASED_OUTPATIENT_CLINIC_OR_DEPARTMENT_OTHER): Payer: 59 | Admitting: Student in an Organized Health Care Education/Training Program

## 2021-02-25 ENCOUNTER — Other Ambulatory Visit: Payer: Self-pay

## 2021-02-25 ENCOUNTER — Encounter: Payer: Self-pay | Admitting: Student in an Organized Health Care Education/Training Program

## 2021-02-25 ENCOUNTER — Ambulatory Visit
Admission: RE | Admit: 2021-02-25 | Discharge: 2021-02-25 | Disposition: A | Discharge status: Home / Self Care | Payer: 59 | Source: Ambulatory Visit | Attending: Student in an Organized Health Care Education/Training Program | Admitting: Student in an Organized Health Care Education/Training Program

## 2021-02-25 DIAGNOSIS — G894 Chronic pain syndrome: Secondary | ICD-10-CM | POA: Diagnosis present

## 2021-02-25 DIAGNOSIS — M47816 Spondylosis without myelopathy or radiculopathy, lumbar region: Secondary | ICD-10-CM

## 2021-02-25 MED ORDER — ROPIVACAINE HCL 2 MG/ML IJ SOLN
9.0000 mL | Freq: Once | INTRAMUSCULAR | Status: AC
  Administered 2021-02-25: 20 mL via PERINEURAL

## 2021-02-25 MED ORDER — DEXAMETHASONE SODIUM PHOSPHATE 10 MG/ML IJ SOLN
10.0000 mg | Freq: Once | INTRAMUSCULAR | Status: AC
  Administered 2021-02-25: 10 mg
  Filled 2021-02-25: qty 1

## 2021-02-25 MED ORDER — LIDOCAINE HCL 2 % IJ SOLN
20.0000 mL | Freq: Once | INTRAMUSCULAR | Status: AC
  Administered 2021-02-25: 100 mg

## 2021-02-25 MED ORDER — ROPIVACAINE HCL 2 MG/ML IJ SOLN
INTRAMUSCULAR | Status: AC
  Filled 2021-02-25: qty 20

## 2021-02-25 MED ORDER — MIDAZOLAM HCL 5 MG/5ML IJ SOLN
1.0000 mg | INTRAMUSCULAR | Status: DC | PRN
  Administered 2021-02-25: 1.5 mg via INTRAVENOUS
  Filled 2021-02-25: qty 5

## 2021-02-25 MED ORDER — LIDOCAINE HCL (PF) 2 % IJ SOLN
INTRAMUSCULAR | Status: AC
  Filled 2021-02-25: qty 10

## 2021-02-25 NOTE — Patient Instructions (Signed)

## 2021-02-25 NOTE — Progress Notes (Signed)
Safety precautions to be maintained throughout the outpatient stay will include: orient to surroundings, keep bed in low position, maintain call bell within reach at all times, provide assistance with transfer out of bed and ambulation.  

## 2021-02-25 NOTE — Progress Notes (Signed)
PROVIDER NOTE: Information contained herein reflects review and annotations entered in association with encounter. Interpretation of such information and data should be left to medically-trained personnel. Information provided to patient can be located elsewhere in the medical record under "Patient Instructions". Document created using STT-dictation technology, any transcriptional errors that may result from process are unintentional.    Patient: Stacy Martinez  Service Category: Procedure  Provider: Gillis Santa, MD  DOB: December 22, 1955  DOS: 02/25/2021  Location: [facility]  MRN: [id number]  Setting: Ambulatory - outpatient  Referring Provider: Gillis Santa, MD  Type: Established Patient  Specialty: Interventional Pain Management  PCP: Jearld Fenton, NP   Primary Reason for Visit: Interventional Pain Management Treatment. CC: Back Pain (Right )  Procedure:          Anesthesia, Analgesia, Anxiolysis:  Type: Thermal Lumbar Facet, Medial Branch Radiofrequency Ablation/Neurotomy           Primary Purpose: Therapeutic Region: Posterolateral Lumbosacral Spine Level: L3, L4, L5, Medial Branch Level(s). These levels will denervate the L3-4, L4-5, lumbar facet joints. Laterality: Right  Type:  minimal sedation  IV Versed Indication(s): Anxiety Route: Intravenous (IV) IV Access: Secured Sedation: Meaningful verbal contact was maintained at all times during the procedure  Local Anesthetic: Lidocaine 1-2%  Position: Prone   Indications: 1. Facet syndrome, lumbar   2. Spondylosis without myelopathy or radiculopathy, lumbar region   3. Lumbar spondylosis   4. Chronic pain syndrome    Stacy Martinez has been dealing with the above chronic pain for longer than three months and has either failed to respond, was unable to tolerate, or simply did not get enough benefit from other more conservative therapies including, but not limited to: 1. Over-the-counter medications 2. Anti-inflammatory  medications 3. Muscle relaxants 4. Membrane stabilizers 5. Opioids 6. Physical therapy and/or chiropractic manipulation 7. Modalities (Heat, ice, etc.) 8. Invasive techniques such as nerve blocks. [name] has attained more than 50% relief of the pain from a series of diagnostic injections conducted in separate occasions.  Pain Score: Pre-procedure: 5 /10 Post-procedure: 0-No pain/10  Pre-op H&P Assessment:  Ms. Blayney is a 65 y.o. (year old), female patient, seen today for interventional treatment. 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 Stacy Martinez has a current medication list which includes the following prescription(s): amlodipine, carvedilol, diclofenac, enalapril, furosemide, gabapentin, relion glucose test strips, lantus solostar, insulin glargine, jardiance, relion ultra thin lancets, rosuvastatin, sitagliptin, tizanidine, and ulticare mini pen needles, and the following Facility-Administered Medications: midazolam. Her primarily concern today is the Back Pain (Right )  Initial Vital Signs:  Pulse/HCG Rate: 76ECG Heart Rate: 74 Temp: (!) 97.4 F (36.3 C) Resp: 16 BP: (!) 149/67 SpO2: 99 %  BMI: Estimated body mass index is 46.09 kg/m as calculated from the following:   Height as of this encounter: 5\' 2"  (1.575 m).   Weight as of this encounter: 252 lb (114.3 kg).  Risk Assessment: Allergies: Reviewed. She is allergic to prednisone and metformin and related.  Allergy Precautions: None required Coagulopathies: Reviewed. None identified.  Blood-thinner therapy: None at this time Active Infection(s): Reviewed. None identified. Stacy Martinez is afebrile  Site Confirmation: Stacy Martinez was asked to confirm the procedure and laterality before marking the site Procedure checklist: Completed Consent: Before the  procedure and under the influence of no sedative(s), amnesic(s), or anxiolytics, the  patient was informed of the treatment options, risks and possible complications. To fulfill our ethical and legal obligations, as recommended by the American Medical Association's Code of Ethics, I have informed the patient of my clinical impression; the nature and purpose of the treatment or procedure; the risks, benefits, and possible complications of the intervention; the alternatives, including doing nothing; the risk(s) and benefit(s) of the alternative treatment(s) or procedure(s); and the risk(s) and benefit(s) of doing nothing. The patient was provided information about the general risks and possible complications associated with the procedure. These may include, but are not limited to: failure to achieve desired goals, infection, bleeding, organ or nerve damage, allergic reactions, paralysis, and death. In addition, the patient was informed of those risks and complications associated to Spine-related procedures, such as failure to decrease pain; infection (i.e.: Meningitis, epidural or intraspinal abscess); bleeding (i.e.: epidural hematoma, subarachnoid hemorrhage, or any other type of intraspinal or peri-dural bleeding); organ or nerve damage (i.e.: Any type of peripheral nerve, nerve root, or spinal cord injury) with subsequent damage to sensory, motor, and/or autonomic systems, resulting in permanent pain, numbness, and/or weakness of one or several areas of the body; allergic reactions; (i.e.: anaphylactic reaction); and/or death. Furthermore, the patient was informed of those risks and complications associated with the medications. These include, but are not limited to: allergic reactions (i.e.: anaphylactic or anaphylactoid reaction(s)); adrenal axis suppression; blood sugar elevation that in diabetics may result in ketoacidosis or comma; water retention that in patients with history of congestive heart failure  may result in shortness of breath, pulmonary edema, and decompensation with resultant heart failure; weight gain; swelling or edema; medication-induced neural toxicity; particulate matter embolism and blood vessel occlusion with resultant organ, and/or nervous system infarction; and/or aseptic necrosis of one or more joints. Finally, the patient was informed that Medicine is not an exact science; therefore, there is also the possibility of unforeseen or unpredictable risks and/or possible complications that may result in a catastrophic outcome. The patient indicated having understood very clearly. We have given the patient no guarantees and we have made no promises. Enough time was given to the patient to ask questions, all of which were answered to the patient's satisfaction. Ms. Knieriem has indicated that she wanted to continue with the procedure. Attestation: I, the ordering provider, attest that I have discussed with the patient the benefits, risks, side-effects, alternatives, likelihood of achieving goals, and potential problems during recovery for the procedure that I have provided informed consent. Date  Time: 02/25/2021  7:55 AM  Pre-Procedure Preparation:  Monitoring: As per clinic protocol. Respiration, ETCO2, SpO2, BP, heart rate and rhythm monitor placed and checked for adequate function Safety Precautions: Patient was assessed for positional comfort and pressure points before starting the procedure. Time-out: I initiated and conducted the "Time-out" before starting the procedure, as per protocol. The patient was asked to participate by confirming the accuracy of the "Time Out" information. Verification of the correct person, site, and procedure were performed and confirmed by me, the nursing staff, and the patient. "Time-out" conducted as per Joint Commission's Universal Protocol (UP.01.01.01). Time: 0835  Description of Procedure:          Laterality: Right Levels:  L3, L4, L5, Medial Branch  Level(s), at the L3-4, L4-5, lumbar facet joints. Area Prepped: Lumbosacral DuraPrep (Iodine Povacrylex [0.7% available iodine] and Isopropyl Alcohol, 74% w/w) Safety Precautions: Aspiration looking for blood return was conducted prior to all injections. At no point did we inject any substances, as  a needle was being advanced. Before injecting, the patient was told to immediately notify me if she was experiencing any new onset of "ringing in the ears, or metallic taste in the mouth". No attempts were made at seeking any paresthesias. Safe injection practices and needle disposal techniques used. Medications properly checked for expiration dates. SDV (single dose vial) medications used. After the completion of the procedure, all disposable equipment used was discarded in the proper designated medical waste containers. Local Anesthesia: Protocol guidelines were followed. The patient was positioned over the fluoroscopy table. The area was prepped in the usual manner. The time-out was completed. The target area was identified using fluoroscopy. A 12-in long, straight, sterile hemostat was used with fluoroscopic guidance to locate the targets for each level blocked. Once located, the skin was marked with an approved surgical skin marker. Once all sites were marked, the skin (epidermis, dermis, and hypodermis), as well as deeper tissues (fat, connective tissue and muscle) were infiltrated with a small amount of a short-acting local anesthetic, loaded on a 10cc syringe with a 25G, 1.5-in  Needle. An appropriate amount of time was allowed for local anesthetics to take effect before proceeding to the next step. Local Anesthetic: Lidocaine 2.0% The unused portion of the local anesthetic was discarded in the proper designated containers. Technical explanation of process:   Radiofrequency Ablation (RFA) L3 Medial Branch Nerve RFA: The target area for the L3 medial branch is at the junction of the postero-lateral aspect  of the superior articular process and the superior, posterior, and medial edge of the transverse process of L4. Under fluoroscopic guidance, a Radiofrequency needle was inserted until contact was made with os over the superior postero-lateral aspect of the pedicular shadow (target area). Sensory and motor testing was conducted to properly adjust the position of the needle. Once satisfactory placement of the needle was achieved, the numbing solution was slowly injected after negative aspiration for blood. 2.0 mL of the nerve block solution was injected without difficulty or complication. After waiting for at least 3 minutes, the ablation was performed. Once completed, the needle was removed intact. L4 Medial Branch Nerve RFA: The target area for the L4 medial branch is at the junction of the postero-lateral aspect of the superior articular process and the superior, posterior, and medial edge of the transverse process of L5. Under fluoroscopic guidance, a Radiofrequency needle was inserted until contact was made with os over the superior postero-lateral aspect of the pedicular shadow (target area). Sensory and motor testing was conducted to properly adjust the position of the needle. Once satisfactory placement of the needle was achieved, the numbing solution was slowly injected after negative aspiration for blood. 2.0 mL of the nerve block solution was injected without difficulty or complication. After waiting for at least 3 minutes, the ablation was performed. Once completed, the needle was removed intact. L5 Medial Branch Nerve RFA: The target area for the L5 medial branch is at the junction of the postero-lateral aspect of the superior articular process of S1 and the superior, posterior, and medial edge of the sacral ala. Under fluoroscopic guidance, a Radiofrequency needle was inserted until contact was made with os over the superior postero-lateral aspect of the pedicular shadow (target area). Sensory and  motor testing was conducted to properly adjust the position of the needle. Once satisfactory placement of the needle was achieved, the numbing solution was slowly injected after negative aspiration for blood. 2.0 mL of the nerve block solution was injected without difficulty or  complication. After waiting for at least 3 minutes, the ablation was performed. Once completed, the needle was removed intact.  Radiofrequency lesioning (ablation):  Radiofrequency Generator: NeuroTherm [id number] Sensory Stimulation Parameters: 50 Hz was used to locate & identify the nerve, making sure that the needle was positioned such that there was no sensory stimulation below 0.3 V or above 0.7 V. Motor Stimulation Parameters: 2 Hz was used to evaluate the motor component. Care was taken not to lesion any nerves that demonstrated motor stimulation of the lower extremities at an output of less than 2.5 times that of the sensory threshold, or a maximum of 2.0 V. Lesioning Technique Parameters: Standard Radiofrequency settings. (Not bipolar or pulsed.) Temperature Settings: 80 degrees C Lesioning time: 60 seconds Intra-operative Compliance: Compliant Materials & Medications: Needle(s) (Electrode/Cannula) Type: Teflon-coated, curved tip, Radiofrequency needle(s) Gauge: 22G Length: 10cm Numbing solution: 6 cc solution made of  5 cc of 0.2% ropivacaine, 1 cc of Decadron 10 mg/cc.  2 cc injected at each level above for  the right side after sensorimotor testing, prior to lesioning. Once the entire procedure was completed, the treated area was cleaned, making sure to leave some of the prepping solution back to take advantage of its long term bactericidal properties.    Illustration of the posterior view of the lumbar spine and the posterior neural structures. Laminae of L2 through S1 are labeled. DPRL5, dorsal primary ramus of L5; DPRS1, dorsal primary ramus of S1; DPR3, dorsal primary ramus of L3; FJ, facet (zygapophyseal) joint  L3-L4; I, inferior articular process of L4; LB1, lateral branch of dorsal primary ramus of L1; IAB, inferior articular branches from L3 medial branch (supplies L4-L5 facet joint); IBP, intermediate branch plexus; MB3, medial branch of dorsal primary ramus of L3; NR3, third lumbar nerve root; S, superior articular process of L5; SAB, superior articular branches from L4 (supplies L4-5 facet joint also); TP3, transverse process of L3.  Vitals:   02/25/21 0853 02/25/21 0900 02/25/21 0910 02/25/21 0920  BP: 128/74 (!) 148/68 (!) 152/77 (!) 142/80  Pulse:      Resp: 18 15 18 15   Temp:  97.7 F (36.5 C)  (!) 97.5 F (36.4 C)  TempSrc:      SpO2: 98% 98% 98% 98%  Weight:      Height:       Start Time: 0835 hrs. End Time: 0851 hrs.  Imaging Guidance (Spinal):          Type of Imaging Technique: Fluoroscopy Guidance (Spinal) Indication(s): Assistance in needle guidance and placement for procedures requiring needle placement in or near specific anatomical locations not easily accessible without such assistance. Exposure Time: Please see nurses notes. Contrast: None used. Fluoroscopic Guidance: I was personally present during the use of fluoroscopy. "Tunnel Vision Technique" used to obtain the best possible view of the target area. Parallax error corrected before commencing the procedure. "Direction-depth-direction" technique used to introduce the needle under continuous pulsed fluoroscopy. Once target was reached, antero-posterior, oblique, and lateral fluoroscopic projection used confirm needle placement in all planes. Images permanently stored in EMR. Interpretation: No contrast injected. I personally interpreted the imaging intraoperatively. Adequate needle placement confirmed in multiple planes. Permanent images saved into the patient's record.   Post-operative Assessment:  Post-procedure Vital Signs:  Pulse/HCG Rate: 7678 Temp: (!) 97.5 F (36.4 C) Resp: 15 BP: (!) 142/80 SpO2: 98  %  EBL: None  Complications: No immediate post-treatment complications observed by team, or reported by patient.  Note: The patient tolerated the  entire procedure well. A repeat set of vitals were taken after the procedure and the patient was kept under observation following institutional policy, for this type of procedure. Post-procedural neurological assessment was performed, showing return to baseline, prior to discharge. The patient was provided with post-procedure discharge instructions, including a section on how to identify potential problems. Should any problems arise concerning this procedure, the patient was given instructions to immediately contact us, at any time, without hesitation. In any case, we plan to contact the patient by telephone for a follow-up status report regarding this interventional procedure.  Comments:  No additional relevant information.  Plan of Care  Orders:  Orders Placed This Encounter  Procedures   DG PA[facility] C-ARM 1-60 MIN NO REPORT    Intraoperative interpretation by procedural physician at [place].    Standing Status:   Standing    Number of Occurrences:   1    Order Specific Question:   Reason for exam:    Answer:   Assistance in needle guidance and placement for procedures requiring needle placement in or near specific anatomical locations not easily accessible without such assistance.   Medications ordered for procedure: Meds ordered this encounter  Medications   lidocaine (XYLOCAINE) 2 % (with pres) injection 400 mg   midazolam (VERSED) 5 MG/5ML injection 1-2 mg    Make sure Flumazenil is available in the pyxis when using this medication. If oversedation occurs, administer 0.2 mg IV over 15 sec. If after 45 sec no response, administer 0.2 mg again over 1 min; may repeat at 1 min intervals; not to exceed 4 doses (1 mg)   ropivacaine (PF) 2 mg/mL (0.2%) (NAROPIN) injection 9 mL   dexamethasone (DECADRON) injection 10 mg    Medications administered: We administered lidocaine, midazolam, ropivacaine (PF) 2 mg/mL (0.2%), and dexamethasone.  See the medical record for exact dosing, route, and time of administration.  Follow-up plan:   Return for Keep [facility]. appt, Contra-lateral RFA 03/20/21.      Status post L2, L3, L4, L5 facet medial branch nerve block on 03/21/2019: Helpful.  Status post right shoulder steroid injection #2 on 03/09/2019 which was not helpful, having worsening shoulder pain.  Right L3, L4, L5 RFA 02/25/21       Recent Visits Date Type Provider Dept  02/06/21 Telemedicine Gillis Santa, MD [facility]  01/16/21 Procedure visit Gillis Santa, MD [facility]  12/13/20 Office Visit Gillis Santa, MD [facility]  Showing recent visits within past 90 days and meeting all other requirements Today's Visits Date Type Provider Dept  02/25/21 Procedure visit Gillis Santa, MD [facility]  Showing today's visits and meeting all other requirements Future Appointments Date Type Provider Dept  03/20/21 Appointment Gillis Santa, MD [facility]  Showing future appointments within next 90 days and meeting all other requirements Disposition: Discharge home  Discharge (Date  Time): 02/25/2021; 0923 hrs.   Primary Care Physician: Jearld Fenton, NP Location: [facility] Note by: Gillis Santa, MD Date: 02/25/2021; Time: 10:40 AM  Disclaimer:  Medicine is not an exact science. The only guarantee in medicine is that nothing is guaranteed. It is important to note that the decision to proceed with this intervention was based on the information collected from the patient. The Data and conclusions were drawn from the patient's questionnaire, the interview, and the physical examination. Because the information was provided in large part by the patient, it cannot be guaranteed that  it has not been purposely or unconsciously manipulated. Every  effort has been made to obtain as much relevant data as possible for this evaluation. It is important to note that the conclusions that lead to this procedure are derived in large part from the available data. Always take into account that the treatment will also be dependent on availability of resources and existing treatment guidelines, considered by other Pain Management Practitioners as being common knowledge and practice, at the time of the intervention. For Medico-Legal purposes, it is also important to point out that variation in procedural techniques and pharmacological choices are the acceptable norm. The indications, contraindications, technique, and results of the above procedure should only be interpreted and judged by a Board-Certified Interventional Pain Specialist with extensive familiarity and expertise in the same exact procedure and technique.

## 2021-02-26 ENCOUNTER — Telehealth: Payer: Self-pay

## 2021-02-26 NOTE — Telephone Encounter (Signed)
Post procedure phone call.  Call could not be completed at this time. Unable to leave a message.

## 2021-03-13 ENCOUNTER — Other Ambulatory Visit: Payer: Self-pay | Admitting: Internal Medicine

## 2021-03-13 DIAGNOSIS — I10 Essential (primary) hypertension: Secondary | ICD-10-CM

## 2021-03-13 NOTE — Telephone Encounter (Signed)
Future appt 1  Month .

## 2021-03-20 ENCOUNTER — Ambulatory Visit
Admission: RE | Admit: 2021-03-20 | Discharge: 2021-03-20 | Disposition: A | Discharge status: Home / Self Care | Payer: 59 | Source: Ambulatory Visit | Attending: Student in an Organized Health Care Education/Training Program | Admitting: Student in an Organized Health Care Education/Training Program

## 2021-03-20 ENCOUNTER — Ambulatory Visit (HOSPITAL_BASED_OUTPATIENT_CLINIC_OR_DEPARTMENT_OTHER): Payer: 59 | Admitting: Student in an Organized Health Care Education/Training Program

## 2021-03-20 ENCOUNTER — Other Ambulatory Visit: Payer: Self-pay

## 2021-03-20 ENCOUNTER — Encounter: Payer: Self-pay | Admitting: Student in an Organized Health Care Education/Training Program

## 2021-03-20 DIAGNOSIS — G894 Chronic pain syndrome: Secondary | ICD-10-CM | POA: Insufficient documentation

## 2021-03-20 DIAGNOSIS — M47816 Spondylosis without myelopathy or radiculopathy, lumbar region: Secondary | ICD-10-CM

## 2021-03-20 MED ORDER — DEXAMETHASONE SODIUM PHOSPHATE 10 MG/ML IJ SOLN
10.0000 mg | Freq: Once | INTRAMUSCULAR | Status: AC
  Administered 2021-03-20: 10 mg

## 2021-03-20 MED ORDER — MIDAZOLAM HCL 5 MG/5ML IJ SOLN
1.0000 mg | INTRAMUSCULAR | Status: DC | PRN
  Administered 2021-03-20: 1 mg via INTRAVENOUS

## 2021-03-20 MED ORDER — MIDAZOLAM HCL 5 MG/5ML IJ SOLN
INTRAMUSCULAR | Status: AC
  Filled 2021-03-20: qty 5

## 2021-03-20 MED ORDER — LIDOCAINE HCL (PF) 2 % IJ SOLN
INTRAMUSCULAR | Status: AC
  Filled 2021-03-20: qty 10

## 2021-03-20 MED ORDER — ROPIVACAINE HCL 2 MG/ML IJ SOLN
9.0000 mL | Freq: Once | INTRAMUSCULAR | Status: AC
  Administered 2021-03-20: 20 mL via PERINEURAL

## 2021-03-20 MED ORDER — LIDOCAINE HCL 2 % IJ SOLN
20.0000 mL | Freq: Once | INTRAMUSCULAR | Status: AC
  Administered 2021-03-20: 200 mg

## 2021-03-20 MED ORDER — ROPIVACAINE HCL 2 MG/ML IJ SOLN
INTRAMUSCULAR | Status: AC
  Filled 2021-03-20: qty 20

## 2021-03-20 MED ORDER — DEXAMETHASONE SODIUM PHOSPHATE 10 MG/ML IJ SOLN
INTRAMUSCULAR | Status: AC
  Filled 2021-03-20: qty 1

## 2021-03-20 NOTE — Patient Instructions (Signed)

## 2021-03-20 NOTE — Progress Notes (Signed)
Safety precautions to be maintained throughout the outpatient stay will include: orient to surroundings, keep bed in low position, maintain call bell within reach at all times, provide assistance with transfer out of bed and ambulation.  

## 2021-03-20 NOTE — Progress Notes (Signed)
PROVIDER NOTE: Information contained herein reflects review and annotations entered in association with encounter. Interpretation of such information and data should be left to medically-trained personnel. Information provided to patient can be located elsewhere in the medical record under "Patient Instructions". Document created using STT-dictation technology, any transcriptional errors that may result from process are unintentional.    Patient: Stacy Martinez  Service Category: Procedure  Provider: Gillis Santa, MD  DOB: 1955/11/23  DOS: 03/20/2021  Location: [facility]  MRN: [id number]  Setting: Ambulatory - outpatient  Referring Provider: Gillis Santa, MD  Type: Established Patient  Specialty: Interventional Pain Management  PCP: Jearld Fenton, NP   Primary Reason for Visit: Interventional Pain Management Treatment. CC: Back Pain (Lower left), Hip Pain (left), and Knee Pain (left)  Procedure:          Anesthesia, Analgesia, Anxiolysis:  Type: Thermal Lumbar Facet, Medial Branch Radiofrequency Ablation/Neurotomy           Primary Purpose: Therapeutic Region: Posterolateral Lumbosacral Spine Level: L3, L4, L5, Medial Branch Level(s). These levels will denervate the L3-4, L4-5, lumbar facet joints. Laterality: Left  Type:  minimal sedation  IV Versed Indication(s): Anxiety Route: Intravenous (IV) IV Access: Secured Sedation: Meaningful verbal contact was maintained at all times during the procedure  Local Anesthetic: Lidocaine 1-2%  Position: Prone   Indications: 1. Facet syndrome, lumbar   2. Spondylosis without myelopathy or radiculopathy, lumbar region   3. Lumbar spondylosis   4. Chronic pain syndrome    Stacy Martinez has been dealing with the above chronic pain for longer than three months and has either failed to respond, was unable to tolerate, or simply did not get enough benefit from other more conservative therapies including, but not limited to: 1.  Over-the-counter medications 2. Anti-inflammatory medications 3. Muscle relaxants 4. Membrane stabilizers 5. Opioids 6. Physical therapy and/or chiropractic manipulation 7. Modalities (Heat, ice, etc.) 8. Invasive techniques such as nerve blocks. Stacy Martinez has attained more than 50% relief of the pain from a series of diagnostic injections conducted in separate occasions.  Pain Score: Pre-procedure: 6 /10 Post-procedure: 0-No pain/10  Pre-op H&P Assessment:  Stacy Martinez is a 65 y.o. (year old), female patient, seen today for interventional treatment. 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 Stacy Martinez has a current medication list which includes the following prescription(s): amlodipine, carvedilol, diclofenac, enalapril, furosemide, gabapentin, relion glucose test strips, lantus solostar, insulin glargine, jardiance, relion ultra thin lancets, rosuvastatin, sitagliptin, and ulticare mini pen needles, and the following Facility-Administered Medications: midazolam. Her primarily concern today is the Back Pain (Lower left), Hip Pain (left), and Knee Pain (left)  Initial Vital Signs:  Pulse/HCG Rate: 77ECG Heart Rate: 75 Temp: 97.9 F (36.6 C) Resp: 16 BP: (!) 147/71 SpO2: 100 %  BMI: Estimated body mass index is 43.9 kg/m as calculated from the following:   Height as of this encounter: 5\' 2"  (1.575 m).   Weight as of this encounter: 240 lb (108.9 kg).  Risk Assessment: Allergies: Reviewed. She is allergic to prednisone and metformin and related.  Allergy Precautions: None required Coagulopathies: Reviewed. None identified.  Blood-thinner therapy: None at this time Active Infection(s): Reviewed. None identified. Stacy Martinez is afebrile  Site Confirmation: Ms. Demo was asked to confirm the procedure and laterality  before marking the site Procedure checklist: Completed Consent: Before the  procedure and under the influence of no sedative(s), amnesic(s), or anxiolytics, the patient was informed of the treatment options, risks and possible complications. To fulfill our ethical and legal obligations, as recommended by the American Medical Association's Code of Ethics, I have informed the patient of my clinical impression; the nature and purpose of the treatment or procedure; the risks, benefits, and possible complications of the intervention; the alternatives, including doing nothing; the risk(s) and benefit(s) of the alternative treatment(s) or procedure(s); and the risk(s) and benefit(s) of doing nothing. The patient was provided information about the general risks and possible complications associated with the procedure. These may include, but are not limited to: failure to achieve desired goals, infection, bleeding, organ or nerve damage, allergic reactions, paralysis, and death. In addition, the patient was informed of those risks and complications associated to Spine-related procedures, such as failure to decrease pain; infection (i.e.: Meningitis, epidural or intraspinal abscess); bleeding (i.e.: epidural hematoma, subarachnoid hemorrhage, or any other type of intraspinal or peri-dural bleeding); organ or nerve damage (i.e.: Any type of peripheral nerve, nerve root, or spinal cord injury) with subsequent damage to sensory, motor, and/or autonomic systems, resulting in permanent pain, numbness, and/or weakness of one or several areas of the body; allergic reactions; (i.e.: anaphylactic reaction); and/or death. Furthermore, the patient was informed of those risks and complications associated with the medications. These include, but are not limited to: allergic reactions (i.e.: anaphylactic or anaphylactoid reaction(s)); adrenal axis suppression; blood sugar elevation that in diabetics may result in ketoacidosis or comma;  water retention that in patients with history of congestive heart failure may result in shortness of breath, pulmonary edema, and decompensation with resultant heart failure; weight gain; swelling or edema; medication-induced neural toxicity; particulate matter embolism and blood vessel occlusion with resultant organ, and/or nervous system infarction; and/or aseptic necrosis of one or more joints. Finally, the patient was informed that Medicine is not an exact science; therefore, there is also the possibility of unforeseen or unpredictable risks and/or possible complications that may result in a catastrophic outcome. The patient indicated having understood very clearly. We have given the patient no guarantees and we have made no promises. Enough time was given to the patient to ask questions, all of which were answered to the patient's satisfaction. Ms. Niznik has indicated that she wanted to continue with the procedure. Attestation: I, the ordering provider, attest that I have discussed with the patient the benefits, risks, side-effects, alternatives, likelihood of achieving goals, and potential problems during recovery for the procedure that I have provided informed consent. Date  Time: 03/20/2021  8:07 AM  Pre-Procedure Preparation:  Monitoring: As per clinic protocol. Respiration, ETCO2, SpO2, BP, heart rate and rhythm monitor placed and checked for adequate function Safety Precautions: Patient was assessed for positional comfort and pressure points before starting the procedure. Time-out: I initiated and conducted the "Time-out" before starting the procedure, as per protocol. The patient was asked to participate by confirming the accuracy of the "Time Out" information. Verification of the correct person, site, and procedure were performed and confirmed by me, the nursing staff, and the patient. "Time-out" conducted as per Joint Commission's Universal Protocol (UP.01.01.01). Time: 0835  Description of  Procedure:          Laterality: Left Levels:  L3, L4, L5, Medial Branch Level(s), at the L3-4, L4-5, lumbar facet joints. Area Prepped: Lumbosacral DuraPrep (Iodine Povacrylex [0.7% available iodine] and Isopropyl Alcohol, 74% w/w) Safety Precautions: Aspiration looking for blood return was conducted prior  to all injections. At no point did we inject any substances, as a needle was being advanced. Before injecting, the patient was told to immediately notify me if she was experiencing any new onset of "ringing in the ears, or metallic taste in the mouth". No attempts were made at seeking any paresthesias. Safe injection practices and needle disposal techniques used. Medications properly checked for expiration dates. SDV (single dose vial) medications used. After the completion of the procedure, all disposable equipment used was discarded in the proper designated medical waste containers. Local Anesthesia: Protocol guidelines were followed. The patient was positioned over the fluoroscopy table. The area was prepped in the usual manner. The time-out was completed. The target area was identified using fluoroscopy. A 12-in long, straight, sterile hemostat was used with fluoroscopic guidance to locate the targets for each level blocked. Once located, the skin was marked with an approved surgical skin marker. Once all sites were marked, the skin (epidermis, dermis, and hypodermis), as well as deeper tissues (fat, connective tissue and muscle) were infiltrated with a small amount of a short-acting local anesthetic, loaded on a 10cc syringe with a 25G, 1.5-in  Needle. An appropriate amount of time was allowed for local anesthetics to take effect before proceeding to the next step. Local Anesthetic: Lidocaine 2.0% The unused portion of the local anesthetic was discarded in the proper designated containers. Technical explanation of process:   Radiofrequency Ablation (RFA) L3 Medial Branch Nerve RFA: The target area  for the L3 medial branch is at the junction of the postero-lateral aspect of the superior articular process and the superior, posterior, and medial edge of the transverse process of L4. Under fluoroscopic guidance, a Radiofrequency needle was inserted until contact was made with os over the superior postero-lateral aspect of the pedicular shadow (target area). Sensory and motor testing was conducted to properly adjust the position of the needle. Once satisfactory placement of the needle was achieved, the numbing solution was slowly injected after negative aspiration for blood. 2.0 mL of the nerve block solution was injected without difficulty or complication. After waiting for at least 3 minutes, the ablation was performed. Once completed, the needle was removed intact. L4 Medial Branch Nerve RFA: The target area for the L4 medial branch is at the junction of the postero-lateral aspect of the superior articular process and the superior, posterior, and medial edge of the transverse process of L5. Under fluoroscopic guidance, a Radiofrequency needle was inserted until contact was made with os over the superior postero-lateral aspect of the pedicular shadow (target area). Sensory and motor testing was conducted to properly adjust the position of the needle. Once satisfactory placement of the needle was achieved, the numbing solution was slowly injected after negative aspiration for blood. 2.0 mL of the nerve block solution was injected without difficulty or complication. After waiting for at least 3 minutes, the ablation was performed. Once completed, the needle was removed intact. L5 Medial Branch Nerve RFA: The target area for the L5 medial branch is at the junction of the postero-lateral aspect of the superior articular process of S1 and the superior, posterior, and medial edge of the sacral ala. Under fluoroscopic guidance, a Radiofrequency needle was inserted until contact was made with os over the superior  postero-lateral aspect of the pedicular shadow (target area). Sensory and motor testing was conducted to properly adjust the position of the needle. Once satisfactory placement of the needle was achieved, the numbing solution was slowly injected after negative aspiration for blood.  2.0 mL of the nerve block solution was injected without difficulty or complication. After waiting for at least 3 minutes, the ablation was performed. Once completed, the needle was removed intact.  Radiofrequency lesioning (ablation):  Radiofrequency Generator: NeuroTherm [id number] Sensory Stimulation Parameters: 50 Hz was used to locate & identify the nerve, making sure that the needle was positioned such that there was no sensory stimulation below 0.3 V or above 0.7 V. Motor Stimulation Parameters: 2 Hz was used to evaluate the motor component. Care was taken not to lesion any nerves that demonstrated motor stimulation of the lower extremities at an output of less than 2.5 times that of the sensory threshold, or a maximum of 2.0 V. Lesioning Technique Parameters: Standard Radiofrequency settings. (Not bipolar or pulsed.) Temperature Settings: 80 degrees C Lesioning time: 60 seconds Intra-operative Compliance: Compliant Materials & Medications: Needle(s) (Electrode/Cannula) Type: Teflon-coated, curved tip, Radiofrequency needle(s) Gauge: 22G Length: 10cm Numbing solution: 6 cc solution made of  5 cc of 0.2% ropivacaine, 1 cc of Decadron 10 mg/cc.  2 cc injected at each level above for  the left side after sensorimotor testing, prior to lesioning. Once the entire procedure was completed, the treated area was cleaned, making sure to leave some of the prepping solution back to take advantage of its long term bactericidal properties.    Illustration of the posterior view of the lumbar spine and the posterior neural structures. Laminae of L2 through S1 are labeled. DPRL5, dorsal primary ramus of L5; DPRS1, dorsal primary ramus  of S1; DPR3, dorsal primary ramus of L3; FJ, facet (zygapophyseal) joint L3-L4; I, inferior articular process of L4; LB1, lateral branch of dorsal primary ramus of L1; IAB, inferior articular branches from L3 medial branch (supplies L4-L5 facet joint); IBP, intermediate branch plexus; MB3, medial branch of dorsal primary ramus of L3; NR3, third lumbar nerve root; S, superior articular process of L5; SAB, superior articular branches from L4 (supplies L4-5 facet joint also); TP3, transverse process of L3.  Vitals:   03/20/21 0854 03/20/21 0904 03/20/21 0914 03/20/21 0924  BP: (!) 164/88 (!) 157/78 (!) 160/79 (!) 148/80  Pulse:  73 73 72  Resp: 14 16 19 18   Temp:  (!) 97.4 F (36.3 C)    TempSrc:  Temporal    SpO2: 98% 100% 99% 98%  Weight:      Height:       Start Time: 0835 hrs. End Time: 0855 hrs.  Imaging Guidance (Spinal):          Type of Imaging Technique: Fluoroscopy Guidance (Spinal) Indication(s): Assistance in needle guidance and placement for procedures requiring needle placement in or near specific anatomical locations not easily accessible without such assistance. Exposure Time: Please see nurses notes. Contrast: None used. Fluoroscopic Guidance: I was personally present during the use of fluoroscopy. "Tunnel Vision Technique" used to obtain the best possible view of the target area. Parallax error corrected before commencing the procedure. "Direction-depth-direction" technique used to introduce the needle under continuous pulsed fluoroscopy. Once target was reached, antero-posterior, oblique, and lateral fluoroscopic projection used confirm needle placement in all planes. Images permanently stored in EMR. Interpretation: No contrast injected. I personally interpreted the imaging intraoperatively. Adequate needle placement confirmed in multiple planes. Permanent images saved into the patient's record.   Post-operative Assessment:  Post-procedure Vital Signs:  Pulse/HCG Rate:  7272 Temp: (!) 97.4 F (36.3 C) Resp: 18 BP: (!) 148/80 SpO2: 98 %  EBL: None  Complications: No immediate post-treatment complications observed by team,  or reported by patient.  Note: The patient tolerated the entire procedure well. A repeat set of vitals were taken after the procedure and the patient was kept under observation following institutional policy, for this type of procedure. Post-procedural neurological assessment was performed, showing return to baseline, prior to discharge. The patient was provided with post-procedure discharge instructions, including a section on how to identify potential problems. Should any problems arise concerning this procedure, the patient was given instructions to immediately contact us, at any time, without hesitation. In any case, we plan to contact the patient by telephone for a follow-up status report regarding this interventional procedure.  Comments:  No additional relevant information.  Plan of Care  Orders:  Orders Placed This Encounter  Procedures   DG [facility] C-ARM 1-60 MIN NO REPORT    Intraoperative interpretation by procedural physician at [facility].    Standing Status:   Standing    Number of Occurrences:   1    Order Specific Question:   Reason for exam:    Answer:   Assistance in needle guidance and placement for procedures requiring needle placement in or near specific anatomical locations not easily accessible without such assistance.    Medications ordered for procedure: Meds ordered this encounter  Medications   lidocaine (XYLOCAINE) 2 % (with pres) injection 400 mg   midazolam (VERSED) 5 MG/5ML injection 1-2 mg    Make sure Flumazenil is available in the pyxis when using this medication. If oversedation occurs, administer 0.2 mg IV over 15 sec. If after 45 sec no response, administer 0.2 mg again over 1 min; may repeat at 1 min intervals; not to exceed 4 doses (1 mg)   ropivacaine (PF) 2 mg/mL (0.2%)  (NAROPIN) injection 9 mL   dexamethasone (DECADRON) injection 10 mg    Medications administered: We administered lidocaine, midazolam, ropivacaine (PF) 2 mg/mL (0.2%), and dexamethasone.  See the medical record for exact dosing, route, and time of administration.  Follow-up plan:   Return in about 6 weeks (around 05/01/2021) for Post Procedure Evaluation, virtual.      Status post L2, L3, L4, L5 facet medial branch nerve block on 03/21/2019: Helpful.  Status post right shoulder steroid injection #2 on 03/09/2019 which was not helpful, having worsening shoulder pain.  Right L3, L4, L5 RFA 02/25/21, Left 03/20/21        Recent Visits Date Type Provider Dept  02/25/21 Procedure visit Gillis Santa, MD [facility]  02/06/21 Telemedicine Gillis Santa, MD [facility]  01/16/21 Procedure visit Gillis Santa, MD [facility]  Showing recent visits within past 90 days and meeting all other requirements Today's Visits Date Type Provider Dept  03/20/21 Procedure visit Gillis Santa, MD [facility]  Showing today's visits and meeting all other requirements Future Appointments Date Type Provider Dept  05/01/21 Appointment Gillis Santa, MD [facility]  Showing future appointments within next 90 days and meeting all other requirements Disposition: Discharge home  Discharge (Date  Time): 03/20/2021; 0927 hrs.   Primary Care Physician: Jearld Fenton, NP Location: [facility] Note by: Gillis Santa, MD Date: 03/20/2021; Time: 10:26 AM  Disclaimer:  Medicine is not an exact science. The only guarantee in medicine is that nothing is guaranteed. It is important to note that the decision to proceed with this intervention was based on the information collected from the patient. The Data and conclusions were drawn from the patient's questionnaire, the interview, and  the physical examination. Because the information was provided in  large part by the patient, it cannot be guaranteed that it has not been purposely or unconsciously manipulated. Every effort has been made to obtain as much relevant data as possible for this evaluation. It is important to note that the conclusions that lead to this procedure are derived in large part from the available data. Always take into account that the treatment will also be dependent on availability of resources and existing treatment guidelines, considered by other Pain Management Practitioners as being common knowledge and practice, at the time of the intervention. For Medico-Legal purposes, it is also important to point out that variation in procedural techniques and pharmacological choices are the acceptable norm. The indications, contraindications, technique, and results of the above procedure should only be interpreted and judged by a Board-Certified Interventional Pain Specialist with extensive familiarity and expertise in the same exact procedure and technique.

## 2021-03-21 ENCOUNTER — Telehealth: Payer: Self-pay | Admitting: *Deleted

## 2021-03-21 NOTE — Telephone Encounter (Signed)
Spoke with patient re; Procedure on yesterday.  No questions or concerns.

## 2021-03-22 ENCOUNTER — Other Ambulatory Visit: Payer: Self-pay | Admitting: Internal Medicine

## 2021-03-22 ENCOUNTER — Other Ambulatory Visit: Payer: Self-pay | Admitting: Student in an Organized Health Care Education/Training Program

## 2021-03-22 DIAGNOSIS — G894 Chronic pain syndrome: Secondary | ICD-10-CM

## 2021-04-17 ENCOUNTER — Telehealth: Payer: Self-pay

## 2021-04-17 ENCOUNTER — Other Ambulatory Visit: Payer: Self-pay | Admitting: Internal Medicine

## 2021-04-17 DIAGNOSIS — I10 Essential (primary) hypertension: Secondary | ICD-10-CM

## 2021-04-17 DIAGNOSIS — E1142 Type 2 diabetes mellitus with diabetic polyneuropathy: Secondary | ICD-10-CM

## 2021-04-17 MED ORDER — CARVEDILOL 25 MG PO TABS: ORAL_TABLET | ORAL | 0 refills | Status: DC

## 2021-04-17 NOTE — Telephone Encounter (Signed)
Copied from [place] [id number]. Topic: Quick Communication - Rx Refill/Question >> Apr 16, 2021  2:47 PM Pawlus, Brayton Layman A wrote: Pt called in to request a refill on a medication that was last filled in March 2021, please advise if a refill is possible for carvedilol (COREG) 25 MG tablet or if pt will need an appt.

## 2021-04-22 ENCOUNTER — Encounter: Payer: 59 | Admitting: Internal Medicine

## 2021-04-22 NOTE — Progress Notes (Deleted)
Subjective:    Patient ID: Stacy Martinez, female    DOB: 1956-01-09, 65 y.o.   MRN: [id number]  HPI  Patient presents the clinic today for her annual exam.  Flu: 06/2019 Tetanus: 10/2019 COVID: Never Zostavax: Never Pneumovax: 06/2019 Prevnar: Never Pap smear: Mammogram: Colon screening: Vision screening: Dentist:  Diet: Exercise:  Review of Systems  Past Medical History:  Diagnosis Date   Allergy    Arthritis    Diabetes mellitus without complication ([place])    Enlarged heart    Hypertension     Current Outpatient Medications  Medication Sig Dispense Refill   amLODipine (NORVASC) 10 MG tablet Take 1 tablet by mouth once daily 90 tablet 0   carvedilol (COREG) 25 MG tablet TAKE 1 TABLET BY MOUTH TWICE DAILY WITH A MEAL 180 tablet 0   diclofenac (VOLTAREN) 75 MG EC tablet Take 1 tablet (75 mg total) by mouth daily as needed for moderate pain. 30 tablet 5   enalapril (VASOTEC) 20 MG tablet Take 1 tablet by mouth twice daily 180 tablet 0   furosemide (LASIX) 40 MG tablet Take 2 tablets (80 mg total) by mouth daily. 180 tablet 0   gabapentin (NEURONTIN) 300 MG capsule Take 1 capsule by mouth at bedtime 90 capsule 0   glucose blood (RELION GLUCOSE TEST STRIPS) test strip Use as instructed 100 each 12   insulin glargine (LANTUS SOLOSTAR) 100 UNIT/ML Solostar Pen Inject 32 Units into the skin at bedtime. 9.6 mL 0   insulin glargine (LANTUS) 100 UNIT/ML injection Inject 0.32 mLs (32 Units total) into the skin at bedtime. 10 mL 1   JARDIANCE 25 MG TABS tablet Take 1 tablet by mouth once daily 30 tablet 0   ReliOn Ultra Thin Lancets MISC 1 each by Does not apply route daily. 100 each 11   rosuvastatin (CRESTOR) 20 MG tablet Take 1 tablet (20 mg total) by mouth daily. (Patient taking differently: Take 20 mg by mouth at bedtime.) 90 tablet 1   sitaGLIPtin (JANUVIA) 100 MG tablet Take 1 tablet by mouth once daily 90 tablet 0   ULTICARE MINI PEN NEEDLES 32G X 6 MM MISC 1 Bottle by Does  not apply route as directed. 100 each 1   No current facility-administered medications for this visit.    Allergies  Allergen Reactions   Prednisone Swelling   Metformin And Related     Severe nausea and diarrhea even with extended release versions    Family History  Problem Relation Age of Onset   Diabetes Mother    Cancer Father    Mental illness Son     Social History   Socioeconomic History   Marital status: Divorced    Spouse name: Not on file   Number of children: 4   Years of education: Not on file   Highest education level: Not on file  Occupational History   Not on file  Tobacco Use   Smoking status: Never   Smokeless tobacco: Never  Vaping Use   Vaping Use: Never used  Substance and Sexual Activity   Alcohol use: No    Alcohol/week: 0.0 standard drinks   Drug use: No   Sexual activity: Not on file  Other Topics Concern   Not on file  Social History Narrative   Not on file   Social Determinants of Health   Financial Resource Strain: Not on file  Food Insecurity: Not on file  Transportation Needs: Not on file  Physical Activity: Not  on file  Stress: Not on file  Social Connections: Not on file  Intimate Partner Violence: Not on file     Constitutional: Denies fever, malaise, fatigue, headache or abrupt weight changes.  HEENT: Denies eye pain, eye redness, ear pain, ringing in the ears, wax buildup, runny nose, nasal congestion, bloody nose, or sore throat. Respiratory: Denies difficulty breathing, shortness of breath, cough or sputum production.   Cardiovascular: Denies chest pain, chest tightness, palpitations or swelling in the hands or feet.  Gastrointestinal: Denies abdominal pain, bloating, constipation, diarrhea or blood in the stool.  GU: Denies urgency, frequency, pain with urination, burning sensation, blood in urine, odor or discharge. Musculoskeletal: Denies decrease in range of motion, difficulty with gait, muscle pain or joint pain  and swelling.  Skin: Denies redness, rashes, lesions or ulcercations.  Neurological: Denies dizziness, difficulty with memory, difficulty with speech or problems with balance and coordination.  Psych: Denies anxiety, depression, SI/HI.  No other specific complaints in a complete review of systems (except as listed in HPI above).     Objective:   Physical Exam   There were no vitals taken for this visit. Wt Readings from Last 3 Encounters:  03/20/21 240 lb (108.9 kg)  02/25/21 252 lb (114.3 kg)  01/18/21 252 lb 9.6 oz (114.6 kg)    General: Appears their stated age, well developed, well nourished in NAD. Skin: Warm, dry and intact. No rashes, lesions or ulcerations noted. HEENT: Head: normal shape and size; Eyes: sclera white, no icterus, conjunctiva pink, PERRLA and EOMs intact; Ears: Tm's gray and intact, normal light reflex; Nose: mucosa pink and moist, septum midline; Throat/Mouth: Teeth present, mucosa pink and moist, no exudate, lesions or ulcerations noted.  Neck:  Neck supple, trachea midline. No masses, lumps or thyromegaly present.  Cardiovascular: Normal rate and rhythm. S1,S2 noted.  No murmur, rubs or gallops noted. No JVD or BLE edema. No carotid bruits noted. Pulmonary/Chest: Normal effort and positive vesicular breath sounds. No respiratory distress. No wheezes, rales or ronchi noted.  Abdomen: Soft and nontender. Normal bowel sounds. No distention or masses noted. Liver, spleen and kidneys non palpable. Musculoskeletal: Normal range of motion. No signs of joint swelling. No difficulty with gait.  Neurological: Alert and oriented. Cranial nerves II-XII grossly intact. Coordination normal.  Psychiatric: Mood and affect normal. Behavior is normal. Judgment and thought content normal.    BMET    Component Value Date/Time   NA 141 08/21/2020 1137   NA 136 11/30/2015 1037   K 4.0 08/21/2020 1137   CL 105 08/21/2020 1137   CO2 26 08/21/2020 1137   GLUCOSE 141 (H)  08/21/2020 1137   BUN 18 08/21/2020 1137   BUN 19 11/30/2015 1037   CREATININE 0.67 08/21/2020 1137   CREATININE 0.73 03/22/2019 0909   CALCIUM 9.1 08/21/2020 1137   GFRNONAA >60 08/21/2020 1137   GFRNONAA 88 03/22/2019 0909   GFRAA 102 03/22/2019 0909    Lipid Panel     Component Value Date/Time   CHOL 218 (H) 03/22/2019 0909   CHOL 182 11/30/2015 1037   TRIG 125 03/22/2019 0909   HDL 56 03/22/2019 0909   HDL 47 11/30/2015 1037   CHOLHDL 3.9 03/22/2019 0909   VLDL 33 (H) 04/08/2017 0857   LDLCALC 137 (H) 03/22/2019 0909    CBC    Component Value Date/Time   WBC 10.7 (H) 08/21/2020 1137   RBC 4.89 08/21/2020 1137   HGB 12.8 08/21/2020 1137   HGB 12.2 11/30/2015  1037   HCT 40.7 08/21/2020 1137   HCT 38.0 11/30/2015 1037   PLT 481 (H) 08/21/2020 1137   PLT 428 (H) 11/30/2015 1037   MCV 83.2 08/21/2020 1137   MCV 83 11/30/2015 1037   MCH 26.2 08/21/2020 1137   MCHC 31.4 08/21/2020 1137   RDW 14.6 08/21/2020 1137   RDW 15.1 11/30/2015 1037   LYMPHSABS 2.2 08/21/2020 1137   LYMPHSABS 3.1 11/30/2015 1037   MONOABS 0.7 08/21/2020 1137   EOSABS 0.8 (H) 08/21/2020 1137   EOSABS 0.4 11/30/2015 1037   BASOSABS 0.1 08/21/2020 1137   BASOSABS 0.1 11/30/2015 1037    Hgb A1C Lab Results  Component Value Date   HGBA1C 7.8 (A) 01/18/2021           Assessment & Plan:   Preventative Health Maintenance:  Encouraged her to get a flu shot in the fall Tetanus UTD Encouraged her to get a COVID-vaccine Discussed Shingrix vaccine, she will reach out to her insurance company regarding coverage Pneumovax UTD Prevnar today Pap smear Mammogram Bone density Colon screening Encouraged her to consume a balanced diet and exercise regimen Advised her to see an eye doctor and dentist annually We will check CBC, c-Met, TSH, lipid, A1c today  RTC in 6 months, follow-up chronic conditions Webb Silversmith, NP  This visit occurred during the SARS-CoV-2 public health emergency.   Safety protocols were in place, including screening questions prior to the visit, additional usage of staff PPE, and extensive cleaning of exam room while observing appropriate contact time as indicated for disinfecting solutions.

## 2021-04-23 ENCOUNTER — Telehealth: Payer: Self-pay

## 2021-04-23 ENCOUNTER — Encounter: Payer: Self-pay | Admitting: Student in an Organized Health Care Education/Training Program

## 2021-04-24 ENCOUNTER — Ambulatory Visit
Payer: 59 | Attending: Student in an Organized Health Care Education/Training Program | Admitting: Student in an Organized Health Care Education/Training Program

## 2021-04-24 ENCOUNTER — Other Ambulatory Visit: Payer: Self-pay

## 2021-04-24 DIAGNOSIS — M47816 Spondylosis without myelopathy or radiculopathy, lumbar region: Secondary | ICD-10-CM | POA: Diagnosis not present

## 2021-04-24 DIAGNOSIS — M5136 Other intervertebral disc degeneration, lumbar region: Secondary | ICD-10-CM

## 2021-04-24 DIAGNOSIS — G894 Chronic pain syndrome: Secondary | ICD-10-CM

## 2021-04-24 NOTE — Progress Notes (Signed)
Patient: Stacy Martinez  Service Category: E/M  Provider: Gillis Santa, MD  DOB: 1955-12-29  DOS: 04/24/2021  Location: Office  MRN: [id number]  Setting: Ambulatory outpatient  Referring Provider: Jearld Fenton, NP  Type: Established Patient  Specialty: Interventional Pain Management  PCP: Stacy Fenton, NP  Location: Home  Delivery: TeleHealth     Virtual Encounter - Pain Management PROVIDER NOTE: Information contained herein reflects review and annotations entered in association with encounter. Interpretation of such information and data should be left to medically-trained personnel. Information provided to patient can be located elsewhere in the medical record under "Patient Instructions". Document created using STT-dictation technology, any transcriptional errors that may result from process are unintentional.    Contact & Pharmacy Preferred: [phone] Home: [phone] (home) Mobile: [phone] (mobile) E-mail: [email or web address], [place] [address] [place] [address] Phone: [phone] Fax: [phone]   Pre-screening  Stacy Martinez offered "in-person" vs "virtual" encounter. She indicated preferring virtual for this encounter.   Reason COVID-19*  Social distancing based on CDC and AMA recommendations.   I contacted Stacy Martinez on 04/24/2021 via telephone.      I clearly identified myself as Stacy Santa, MD. I verified that I was speaking with the correct person using two identifiers (Name: Stacy Martinez, and date of birth: 1955/10/08).  Consent I sought verbal advanced consent from Stacy Martinez for virtual visit interactions. I informed Stacy Martinez of possible security and privacy concerns, risks, and limitations associated with providing "not-in-person" medical evaluation and management services. I also informed Stacy Martinez of the availability of "in-person" appointments. Finally, I informed her that there would be a charge for the  virtual visit and that she could be  personally, fully or partially, financially responsible for it. Stacy Martinez expressed understanding and agreed to proceed.   Historic Elements   Stacy Martinez is a 65 y.o. year old, female patient evaluated today after our last contact on 03/22/2021. Stacy Martinez  has a past medical history of Allergy, Arthritis, Diabetes mellitus without complication ([place]), Enlarged heart, and Hypertension. She also  has a past surgical history that includes Wrist fracture surgery (Right, 2013); Cesarean section (1999, 2001); Hernia repair (2001, 2006); Fracture surgery; and Reverse shoulder arthroplasty (Right, 08/27/2020). Stacy Martinez has a current medication list which includes the following prescription(s): amlodipine, carvedilol, diclofenac, enalapril, furosemide, gabapentin, relion glucose test strips, insulin glargine, jardiance, relion ultra thin lancets, rosuvastatin, sitagliptin, ulticare mini pen needles, and lantus solostar. She  reports that she has never smoked. She has never used smokeless tobacco. She reports that she does not drink alcohol and does not use drugs. Stacy Martinez is allergic to prednisone and metformin and related.   HPI  Today, she is being contacted for a post-procedure assessment.   Post-Procedure Evaluation  Procedure (03/20/2021):   Type: Thermal Lumbar Facet, Medial Branch Radiofrequency Ablation/Neurotomy           Primary Purpose: Therapeutic Region: Posterolateral Lumbosacral Spine Level: L3, L4, L5, Medial Branch Level(s). These levels will denervate the L3-4, L4-5, lumbar facet joints. Laterality: Left  Type: Thermal Lumbar Facet, Medial Branch Radiofrequency Ablation/Neurotomy           Primary Purpose: Therapeutic Region: Posterolateral Lumbosacral Spine Level: L3, L4, L5, Medial Branch Level(s). These levels will denervate the L3-4, L4-5, lumbar facet joints. Laterality: Right  Anxiolysis: Please see nurses note.  Effectiveness during  initial hour after procedure (Ultra-Short Term  Relief): 100 % for both right and left  Local anesthetic used: Long-acting (4-6 hours) Effectiveness: Defined as any analgesic benefit obtained secondary to the administration of local anesthetics. This carries significant diagnostic value as to the etiological location, or anatomical origin, of the pain. Duration of benefit is expected to coincide with the duration of the local anesthetic used.  Effectiveness during initial 4-6 hours after procedure (Short-Term Relief): 100 % for both right and left  Long-term benefit: Defined as any relief past the pharmacologic duration of the local anesthetics.  Effectiveness past the initial 6 hours after procedure (Long-Term Relief): 0 % left, 50-60% on the right,   Benefits, current: Defined as benefit present at the time of this evaluation.   Analgesia:  0% on the left, 50 to 60% on the right   Laboratory Chemistry Profile   Renal Lab Results  Component Value Date   BUN 18 08/21/2020   CREATININE 0.67 08/21/2020   BCR 37 (H) 03/22/2019   GFRAA 102 03/22/2019   GFRNONAA >60 08/21/2020    Hepatic Lab Results  Component Value Date   AST 17 08/21/2020   ALT 17 08/21/2020   ALBUMIN 4.0 08/21/2020   ALKPHOS 100 08/21/2020   LIPASE 19 12/18/2013    Electrolytes Lab Results  Component Value Date   NA 141 08/21/2020   K 4.0 08/21/2020   CL 105 08/21/2020   CALCIUM 9.1 08/21/2020    Bone No results found for: VD25OH, VD125OH2TOT, GQ9169IH0, TU8828MK3, 25OHVITD1, 25OHVITD2, 25OHVITD3, TESTOFREE, TESTOSTERONE  Inflammation (CRP: Acute Phase) (ESR: Chronic Phase) Lab Results  Component Value Date   LATICACIDVEN 1.72 01/02/2014         Note: Above Lab results reviewed.   Assessment  The primary encounter diagnosis was Facet syndrome, lumbar. Diagnoses of Spondylosis without myelopathy or radiculopathy, lumbar region, Lumbar spondylosis, DDD (degenerative disc disease), lumbar, and Chronic  pain syndrome were also pertinent to this visit.  Plan of Care    Stacy Martinez has a current medication list which includes the following long-term medication(s): amlodipine, carvedilol, enalapril, furosemide, gabapentin, insulin glargine, rosuvastatin, sitagliptin, and lantus solostar.  Follow-up after lumbar radiofrequency ablation.  Right side was done 02/25/2021, left L3, L4, L5 ablation was done 03/20/2021.  She states that her right side is doing much better.  She endorses 50 to 60% pain relief on her right side and improvement in her ability to flex and extend.  She continues to have pain on her left side.  She states that unfortunately she did not receive any pain relief longer left side after the ablation.  She recalls having greater pain relief when facet medial branch nerve blocks were done in the past.  We discussed doing a palliative, L3, L4, L5 facet medial branch nerve block for her left-sided facet joint pain.  Risks and benefits reviewed and patient would like to proceed.   Orders:  Orders Placed This Encounter  Procedures   LUMBAR FACET(MEDIAL BRANCH NERVE BLOCK) MBNB    Standing Status:   Future    Standing Expiration Date:   05/25/2021    Scheduling Instructions:     Procedure: Lumbar facet block (AKA.: Lumbosacral medial branch nerve block)     Side: LEFT     Level: L3-4, L4-5, Facets (L3, L4, L5, Medial Branch Nerves)     Sedation: Patient's choice.     Timeframe: ASAA    Order Specific Question:   Where will this procedure be performed?    Answer:  [facility]    Follow-up plan:   Return in about 2 weeks (around 05/08/2021) for Left L3, 4, 5 , MNBN  minimal sedation (PO Valium).     Status post L2, L3, L4, L5 facet medial branch nerve block on 03/21/2019: Helpful.  Status post right shoulder steroid injection #2 on 03/09/2019 which was not helpful, having worsening shoulder pain.  Right L3, L4, L5 RFA 02/25/21, Left 03/20/21         Recent Visits Date Type  Provider Dept  03/20/21 Procedure visit Stacy Santa, MD [facility]  02/25/21 Procedure visit Stacy Santa, MD [facility]  02/06/21 Telemedicine Stacy Santa, MD [facility]  Showing recent visits within past 90 days and meeting all other requirements Today's Visits Date Type Provider Dept  04/24/21 Telemedicine Stacy Santa, MD [facility]  Showing today's visits and meeting all other requirements Future Appointments No visits were found meeting these conditions. Showing future appointments within next 90 days and meeting all other requirements I discussed the assessment and treatment plan with the patient. The patient was provided an opportunity to ask questions and all were answered. The patient agreed with the plan and demonstrated an understanding of the instructions.  Patient advised to call back or seek an in-person evaluation if the symptoms or condition worsens.  Duration of encounter: 30 minutes.  Note by: Stacy Santa, MD Date: 04/24/2021; Time: 10:48 AM

## 2021-04-25 NOTE — Patient Instructions (Signed)
______________________________________________________________________  Preparing for your procedure (without sedation)  Procedure appointments are limited to planned procedures: No Prescription Refills. No disability issues will be discussed. No medication changes will be discussed.  Instructions: Oral Intake: Do not eat or drink anything for at least 2 hours prior to your procedure. (Exception: Blood Pressure Medication. See below.) Transportation: You must have a driver. You should not drive for 24 hours after taking the Valium. Blood Pressure Medicine: Do not forget to take your blood pressure medicine with a sip of water the morning of the procedure. If your Diastolic (lower reading)is above 100 mmHg, elective cases will be cancelled/rescheduled. Blood thinners: These will need to be stopped for procedures. Notify our staff if you are taking any blood thinners. Depending on which one you take, there will be specific instructions on how and when to stop it. Diabetics on insulin: Notify the staff so that you can be scheduled 1st case in the morning. If your diabetes requires high dose insulin, take only  of your normal insulin dose the morning of the procedure and notify the staff that you have done so. Preventing infections: Shower with an antibacterial soap the morning of your procedure.  Build-up your immune system: Take 1000 mg of Vitamin C with every meal (3 times a day) the day prior to your procedure. Antibiotics: Inform the staff if you have a condition or reason that requires you to take antibiotics before dental procedures. Pregnancy: If you are pregnant, call and cancel the procedure. Sickness: If you have a cold, fever, or any active infections, call and cancel the procedure. Arrival: You must be in the facility at least 30 minutes prior to your scheduled procedure. Children: Do not bring any children with you. Dress appropriately: Bring dark clothing that you would not mind if  they get stained. Valuables: Do not bring any jewelry or valuables.  Reasons to call and reschedule or cancel your procedure: (Following these recommendations will minimize the risk of a serious complication.) Surgeries: Avoid having procedures within 2 weeks of any surgery. (Avoid for 2 weeks before or after any surgery). Flu Shots: Avoid having procedures within 2 weeks of a flu shots or . (Avoid for 2 weeks before or after immunizations). Barium: Avoid having a procedure within 7-10 days after having had a radiological study involving the use of radiological contrast. (Myelograms, Barium swallow or enema study). Heart attacks: Avoid any elective procedures or surgeries for the initial 6 months after a "Myocardial Infarction" (Heart Attack). Blood thinners: It is imperative that you stop these medications before procedures. Let us know if you if you take any blood thinner.  Infection: Avoid procedures during or within two weeks of an infection (including chest colds or gastrointestinal problems). Symptoms associated with infections include: Localized redness, fever, chills, night sweats or profuse sweating, burning sensation when voiding, cough, congestion, stuffiness, runny nose, sore throat, diarrhea, nausea, vomiting, cold or Flu symptoms, recent or current infections. It is specially important if the infection is over the area that we intend to treat. Heart and lung problems: Symptoms that may suggest an active cardiopulmonary problem include: cough, chest pain, breathing difficulties or shortness of breath, dizziness, ankle swelling, uncontrolled high or unusually low blood pressure, and/or palpitations. If you are experiencing any of these symptoms, cancel your procedure and contact your primary care physician for an evaluation.  Remember:  Regular Business hours are:  Monday to Thursday 8:00 AM to 4:00 PM  Provider's Schedule: Milinda Pointer, MD:  Procedure days:  Tuesday and Thursday  7:30 AM to 4:00 PM  Gillis Santa, MD:  Procedure days: Monday and Wednesday 7:30 AM to 4:00 PM ______________________________________________________________________

## 2021-05-01 ENCOUNTER — Telehealth: Payer: 59 | Admitting: Student in an Organized Health Care Education/Training Program

## 2021-05-02 ENCOUNTER — Telehealth: Payer: Self-pay

## 2021-05-02 NOTE — Telephone Encounter (Signed)
Insurance denied auth for lumbar facets. They state the notes didn't show 50% relief for 3 months from previous shots. No recent xrays showing new problem causing pain.

## 2021-06-03 ENCOUNTER — Other Ambulatory Visit: Payer: Self-pay | Admitting: Student in an Organized Health Care Education/Training Program

## 2021-06-03 DIAGNOSIS — M47816 Spondylosis without myelopathy or radiculopathy, lumbar region: Secondary | ICD-10-CM

## 2021-06-03 DIAGNOSIS — M19011 Primary osteoarthritis, right shoulder: Secondary | ICD-10-CM

## 2021-06-03 MED ORDER — DICLOFENAC SODIUM 75 MG PO TBEC: 75.0000 mg | DELAYED_RELEASE_TABLET | Freq: Every day | ORAL | 5 refills | Status: DC | PRN

## 2021-06-03 MED ORDER — TIZANIDINE HCL 4 MG PO TABS: 4.0000 mg | ORAL_TABLET | Freq: Three times a day (TID) | ORAL | 5 refills | Status: DC | PRN

## 2021-06-04 ENCOUNTER — Encounter: Payer: Self-pay | Admitting: Internal Medicine

## 2021-06-04 ENCOUNTER — Ambulatory Visit: Payer: 59 | Admitting: Student in an Organized Health Care Education/Training Program

## 2021-06-04 ENCOUNTER — Ambulatory Visit
Admission: RE | Admit: 2021-06-04 | Discharge: 2021-06-04 | Disposition: A | Discharge status: Home / Self Care | Payer: 59 | Source: Ambulatory Visit | Attending: Internal Medicine | Admitting: Internal Medicine

## 2021-06-04 ENCOUNTER — Ambulatory Visit
Admission: RE | Admit: 2021-06-04 | Discharge: 2021-06-04 | Disposition: A | Discharge status: Home / Self Care | Payer: 59 | Attending: Internal Medicine | Admitting: Internal Medicine

## 2021-06-04 ENCOUNTER — Ambulatory Visit (INDEPENDENT_AMBULATORY_CARE_PROVIDER_SITE_OTHER): Payer: 59 | Admitting: Internal Medicine

## 2021-06-04 ENCOUNTER — Other Ambulatory Visit: Payer: Self-pay

## 2021-06-04 ENCOUNTER — Ambulatory Visit: Admission: RE | Admit: 2021-06-04 | Payer: 59 | Source: Home / Self Care

## 2021-06-04 VITALS — BP 145/50 | HR 83 | Temp 97.3°F | Resp 18 | Ht 62.0 in | Wt 252.4 lb

## 2021-06-04 DIAGNOSIS — R3911 Hesitancy of micturition: Secondary | ICD-10-CM

## 2021-06-04 DIAGNOSIS — Z23 Encounter for immunization: Secondary | ICD-10-CM | POA: Diagnosis not present

## 2021-06-04 DIAGNOSIS — G8929 Other chronic pain: Secondary | ICD-10-CM

## 2021-06-04 DIAGNOSIS — M25551 Pain in right hip: Secondary | ICD-10-CM | POA: Diagnosis present

## 2021-06-04 DIAGNOSIS — M25562 Pain in left knee: Secondary | ICD-10-CM | POA: Diagnosis present

## 2021-06-04 DIAGNOSIS — M25561 Pain in right knee: Secondary | ICD-10-CM | POA: Insufficient documentation

## 2021-06-04 DIAGNOSIS — R339 Retention of urine, unspecified: Secondary | ICD-10-CM | POA: Diagnosis not present

## 2021-06-04 DIAGNOSIS — R3915 Urgency of urination: Secondary | ICD-10-CM

## 2021-06-04 DIAGNOSIS — R8290 Unspecified abnormal findings in urine: Secondary | ICD-10-CM

## 2021-06-04 LAB — POCT URINALYSIS DIPSTICK
Bilirubin, UA: NEGATIVE
Glucose, UA: POSITIVE — AB
Ketones, UA: NEGATIVE
Nitrite, UA: NEGATIVE
Protein, UA: NEGATIVE
Spec Grav, UA: 1.02 (ref 1.010–1.025)
Urobilinogen, UA: 0.2 E.U./dL
pH, UA: 5 (ref 5.0–8.0)

## 2021-06-04 NOTE — Progress Notes (Signed)
HPI  Pt presents to the clinic today with c/o urinary urgency, hesitancy and urinary retention.  She reports this started 2 years ago.  She denies bladder pressure, flank pain, dysuria, blood in her urine, vaginal irritation, odor or discharge.  She denies fever, chills, nausea or vomiting.  She also reports chronic right hip pain and bilateral knee pain.  She reports this has been a chronic issue that seems to be getting worse.  She describes the pain as achy and sharp.  The pain radiates throughout both of her legs.  She denies weakness but does have numbness and tingling secondary to diabetes.  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 No acute abnormality.  IMPRESSION: 1. 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 2. No fracture or effusion. 3. Cannot exclude loose bodies.  IMPRESSION: Age advanced tricompartmental degenerative joint disease of the right knee. No acute abnormality.  She denies any injury to the area.  She has been taking diclofenac, gabapentin and tizanidine as prescribed with minimal relief of symptoms.  She does have a history of chronic back pain status post nerve ablation by Dr. Holley Raring which helped on the right but not on the left.  She has a follow-up appointment with Dr. Zollie Scale today.  She has not been seeing orthopedics.   Review of Systems  Past Medical History:  Diagnosis Date   Allergy    Arthritis    Diabetes mellitus without complication ([place])    Enlarged heart    Hypertension     Family History  Problem Relation Age of Onset   Diabetes Mother    Cancer Father    Mental illness Son     Social History   Socioeconomic History   Marital status: Divorced    Spouse name: Not on file   Number of children: 4   Years of education: Not on file   Highest education level: Not on file  Occupational History   Not on  file  Tobacco Use   Smoking status: Never   Smokeless tobacco: Never  Vaping Use   Vaping Use: Never used  Substance and Sexual Activity   Alcohol use: No    Alcohol/week: 0.0 standard drinks   Drug use: No   Sexual activity: Not on file  Other Topics Concern   Not on file  Social History Narrative   Not on file   Social Determinants of Health   Financial Resource Strain: Not on file  Food Insecurity: Not on file  Transportation Needs: Not on file  Physical Activity: Not on file  Stress: Not on file  Social Connections: Not on file  Intimate Partner Violence: Not on file    Allergies  Allergen Reactions   Prednisone Swelling   Metformin And Related     Severe nausea and diarrhea even with extended release versions     Constitutional: Denies fever, malaise, fatigue, headache or abrupt weight changes.   GU: Pt reports urgency, urinary hesitancy and retention. Denies burning sensation, dysuria blood in urine,, vaginal irritation, odor or discharge. Skin: Denies redness, rashes, lesions or ulcercations.  MSK: Patient reports chronic right hip pain and bilateral knee pain, decreased range of motion, joint swelling and difficulty with gait.   Neuro: Patient reports numbness and tingling of her lower extremities, difficulty with balance.  Denies weakness.  No other specific complaints in a complete review of systems (  except as listed in HPI above).    Objective:   Physical Exam  BP (!) 145/50 (BP Location: Right Arm, Patient Position: Sitting, Cuff Size: Large)   Pulse 83   Temp (!) 97.3 F (36.3 C) (Temporal)   Resp 18   Ht 5\' 2"  (1.575 m)   Wt 252 lb 6.4 oz (114.5 kg)   SpO2 99%   BMI 46.16 kg/m   Wt Readings from Last 3 Encounters:  03/20/21 240 lb (108.9 kg)  02/25/21 252 lb (114.3 kg)  01/18/21 252 lb 9.6 oz (114.6 kg)    General: Appears her stated age, obese, in NAD. Cardiovascular: Normal rate and rhythm. S1,S2 noted.  Pedal pulses 2+  bilaterally. Pulmonary/Chest: Normal effort and positive vesicular breath sounds. No respiratory distress. No wheezes, rales or ronchi noted.  MSK: Normal flexion, extension, abduction and abduction of the right hip.  Decreased internal and external rotation of the right hip.  Normal flexion and extension of bilateral knees.  No pain with palpation of the knees.  Strength 5/5 BLE.  She has difficulty getting from a sitting to a standing position and difficulty getting up on the exam table.  Gait slow and steady without device. Neuro: Sensation intact to BLE.  Difficulty with balance noted.       Assessment & Plan:   Urgency, Frequency, Urgency and Urinary Hesitancy:  Urinalysis: 2+ leuks Will send urine culture BMET today Drink plenty of fluids Discussed that sometimes people that have pain often tighten her pelvic floor muscles and this could lead to some urinary hesitancy and retention.  Chronic Right Hip Pain:  X-ray reviewed She eventually will need a right hip replacement Discussed weight loss as this can help reduce joint pain Repeat x-ray today Continue diclofenac, gabapentin and tizanidine Referral to orthopedics  Bilateral Knee Pain:  X-ray reviewed She eventually will need a bilateral knee replacement Discussed weight loss as this can help reduce joint pain Repeat x-ray today Continue diclofenac, gabapentin and tizanidine Referral to orthopedics  We will follow-up after x-ray and labs for further recommendations and treatment plan Webb Silversmith, NP This visit occurred during the SARS-CoV-2 public health emergency.  Safety protocols were in place, including screening questions prior to the visit, additional usage of staff PPE, and extensive cleaning of exam room while observing appropriate contact time as indicated for disinfecting solutions.

## 2021-06-04 NOTE — Assessment & Plan Note (Signed)
Encourage diet and exercise for weight loss 

## 2021-06-04 NOTE — Patient Instructions (Signed)
Knee Exercises Ask your health care provider which exercises are safe for you. Do exercises exactly as told by your health care provider and adjust them as directed. It is normal to feel mild stretching, pulling, tightness, or discomfort as you do these exercises. Stop right away if you feel sudden pain or your pain gets worse. Do not begin these exercises until told by your health care provider. Stretching and range-of-motion exercises These exercises warm up your muscles and joints and improve the movement and flexibility of your knee. These exercises also help to relieve pain and swelling. Knee extension, prone Lie on your abdomen (prone position) on a bed. Place your left / right knee just beyond the edge of the surface so your knee is not on the bed. You can put a towel under your left / right thigh just above your kneecap for comfort. Relax your leg muscles and allow gravity to straighten your knee (extension). You should feel a stretch behind your left / right knee. Hold this position for __________ seconds. Scoot up so your knee is supported between repetitions. Repeat __________ times. Complete this exercise __________ times a day. Knee flexion, active  Lie on your back with both legs straight. If this causes back discomfort, bend your left / right knee so your foot is flat on the floor. Slowly slide your left / right heel back toward your buttocks. Stop when you feel a gentle stretch in the front of your knee or thigh (flexion). Hold this position for __________ seconds. Slowly slide your left / right heel back to the starting position. Repeat __________ times. Complete this exercise __________ times a day. Quadriceps stretch, prone  Lie on your abdomen on a firm surface, such as a bed or padded floor. Bend your left / right knee and hold your ankle. If you cannot reach your ankle or pant leg, loop a belt around your foot and grab the belt instead. Gently pull your heel toward your  buttocks. Your knee should not slide out to the side. You should feel a stretch in the front of your thigh and knee (quadriceps). Hold this position for __________ seconds. Repeat __________ times. Complete this exercise __________ times a day. Hamstring, supine Lie on your back (supine position). Loop a belt or towel over the ball of your left / right foot. The ball of your foot is on the walking surface, right under your toes. Straighten your left / right knee and slowly pull on the belt to raise your leg until you feel a gentle stretch behind your knee (hamstring). Do not let your knee bend while you do this. Keep your other leg flat on the floor. Hold this position for __________ seconds. Repeat __________ times. Complete this exercise __________ times a day. Strengthening exercises These exercises build strength and endurance in your knee. Endurance is the ability to use your muscles for a long time, even after they get tired. Quadriceps, isometric This exercise stretches the muscles in front of your thigh (quadriceps) without moving your knee joint (isometric). Lie on your back with your left / right leg extended and your other knee bent. Put a rolled towel or small pillow under your knee if told by your health care provider. Slowly tense the muscles in the front of your left / right thigh. You should see your kneecap slide up toward your hip or see increased dimpling just above the knee. This motion will push the back of the knee toward the floor. For __________   seconds, hold the muscle as tight as you can without increasing your pain. Relax the muscles slowly and completely. Repeat __________ times. Complete this exercise __________ times a day. Straight leg raises This exercise stretches the muscles in front of your thigh (quadriceps) and the muscles that move your hips (hip flexors). Lie on your back with your left / right leg extended and your other knee bent. Tense the muscles in  the front of your left / right thigh. You should see your kneecap slide up or see increased dimpling just above the knee. Your thigh may even shake a bit. Keep these muscles tight as you raise your leg 4-6 inches (10-15 cm) off the floor. Do not let your knee bend. Hold this position for __________ seconds. Keep these muscles tense as you lower your leg. Relax your muscles slowly and completely after each repetition. Repeat __________ times. Complete this exercise __________ times a day. Hamstring, isometric Lie on your back on a firm surface. Bend your left / right knee about __________ degrees. Dig your left / right heel into the surface as if you are trying to pull it toward your buttocks. Tighten the muscles in the back of your thighs (hamstring) to "dig" as hard as you can without increasing any pain. Hold this position for __________ seconds. Release the tension gradually and allow your muscles to relax completely for __________ seconds after each repetition. Repeat __________ times. Complete this exercise __________ times a day. Hamstring curls If told by your health care provider, do this exercise while wearing ankle weights. Begin with __________ lb weights. Then increase the weight by 1 lb (0.5 kg) increments. Do not wear ankle weights that are more than __________ lb. Lie on your abdomen with your legs straight. Bend your left / right knee as far as you can without feeling pain. Keep your hips flat against the floor. Hold this position for __________ seconds. Slowly lower your leg to the starting position. Repeat __________ times. Complete this exercise __________ times a day. Squats This exercise strengthens the muscles in front of your thigh and knee (quadriceps). Stand in front of a table, with your feet and knees pointing straight ahead. You may rest your hands on the table for balance but not for support. Slowly bend your knees and lower your hips like you are going to sit in a  chair. Keep your weight over your heels, not over your toes. Keep your lower legs upright so they are parallel with the table legs. Do not let your hips go lower than your knees. Do not bend lower than told by your health care provider. If your knee pain increases, do not bend as low. Hold the squat position for __________ seconds. Slowly push with your legs to return to standing. Do not use your hands to pull yourself to standing. Repeat __________ times. Complete this exercise __________ times a day. Wall slides This exercise strengthens the muscles in front of your thigh and knee (quadriceps). Lean your back against a smooth wall or door, and walk your feet out 18-24 inches (46-61 cm) from it. Place your feet hip-width apart. Slowly slide down the wall or door until your knees bend __________ degrees. Keep your knees over your heels, not over your toes. Keep your knees in line with your hips. Hold this position for __________ seconds. Repeat __________ times. Complete this exercise __________ times a day. Straight leg raises This exercise strengthens the muscles that rotate the leg at the hip and   move it away from your body (hip abductors). Lie on your side with your left / right leg in the top position. Lie so your head, shoulder, knee, and hip line up. You may bend your bottom knee to help you keep your balance. Roll your hips slightly forward so your hips are stacked directly over each other and your left / right knee is facing forward. Leading with your heel, lift your top leg 4-6 inches (10-15 cm). You should feel the muscles in your outer hip lifting. Do not let your foot drift forward. Do not let your knee roll toward the ceiling. Hold this position for __________ seconds. Slowly return your leg to the starting position. Let your muscles relax completely after each repetition. Repeat __________ times. Complete this exercise __________ times a day. Straight leg raises This  exercise stretches the muscles that move your hips away from the front of the pelvis (hip extensors). Lie on your abdomen on a firm surface. You can put a pillow under your hips if that is more comfortable. Tense the muscles in your buttocks and lift your left / right leg about 4-6 inches (10-15 cm). Keep your knee straight as you lift your leg. Hold this position for __________ seconds. Slowly lower your leg to the starting position. Let your leg relax completely after each repetition. Repeat __________ times. Complete this exercise __________ times a day. This information is not intended to replace advice given to you by your health care provider. Make sure you discuss any questions you have with your health care provider. Document Revised: 06/08/2018 Document Reviewed: 06/08/2018 Elsevier Patient Education  2022 [employer] Inc.  

## 2021-06-05 ENCOUNTER — Other Ambulatory Visit: Payer: Self-pay | Admitting: Internal Medicine

## 2021-06-05 LAB — BASIC METABOLIC PANEL WITH GFR
BUN: 11 mg/dL (ref 7–25)
CO2: 25 mmol/L (ref 20–32)
Calcium: 9.5 mg/dL (ref 8.6–10.4)
Chloride: 103 mmol/L (ref 98–110)
Creat: 0.68 mg/dL (ref 0.50–1.05)
Glucose, Bld: 231 mg/dL — ABNORMAL HIGH (ref 65–99)
Potassium: 4.4 mmol/L (ref 3.5–5.3)
Sodium: 138 mmol/L (ref 135–146)
eGFR: 97 mL/min/{1.73_m2} (ref >=60)

## 2021-06-06 LAB — URINE CULTURE
MICRO NUMBER:: 12460120
SPECIMEN QUALITY:: ADEQUATE

## 2021-06-06 MED ORDER — NITROFURANTOIN MONOHYD MACRO 100 MG PO CAPS: 100.0000 mg | ORAL_CAPSULE | Freq: Two times a day (BID) | ORAL | 0 refills | Status: DC

## 2021-06-06 NOTE — Addendum Note (Signed)
Addended by: Jearld Fenton on: 06/06/2021 12:43 PM   Modules accepted: Orders

## 2021-06-07 ENCOUNTER — Telehealth: Payer: Self-pay

## 2021-06-07 NOTE — Telephone Encounter (Signed)
I attempted to contact the patient to notify her of her lab results. Her home number is busy and cellphone, no answer. Voicemail full.

## 2021-06-07 NOTE — Telephone Encounter (Signed)
Copied from [place] [id number]. Topic: General - Other >> Jun 06, 2021  1:41 PM Pawlus, Brayton Layman A wrote: Reason for CRM: Pt called in wanting to discuss her urine culture results, please call back.

## 2021-06-13 ENCOUNTER — Other Ambulatory Visit: Payer: Self-pay | Admitting: Internal Medicine

## 2021-07-09 ENCOUNTER — Other Ambulatory Visit: Payer: Self-pay | Admitting: Internal Medicine

## 2021-07-09 DIAGNOSIS — E1142 Type 2 diabetes mellitus with diabetic polyneuropathy: Secondary | ICD-10-CM

## 2021-07-22 ENCOUNTER — Other Ambulatory Visit: Payer: Self-pay | Admitting: Internal Medicine

## 2021-07-22 DIAGNOSIS — E1142 Type 2 diabetes mellitus with diabetic polyneuropathy: Secondary | ICD-10-CM

## 2021-07-22 MED ORDER — EMPAGLIFLOZIN 25 MG PO TABS: 25.0000 mg | ORAL_TABLET | Freq: Every day | ORAL | 2 refills | Status: DC

## 2021-07-22 NOTE — Telephone Encounter (Signed)
Medication Refill - Medication: Jardiance 25 mg tablets  Has the patient contacted their pharmacy? Yes.  She said she called them but it was not refilled (Agent: If no, request that the patient contact the pharmacy for the refill. If patient does not wish to contact the pharmacy document the reason why and proceed with request.) (Agent: If yes, when and what did the pharmacy advise?)  Preferred Pharmacy (with phone number or street name): [address] Has the patient been seen for an appointment in the last year OR does the patient have an upcoming appointment? Yes.    Agent: Please be advised that RX refills may take up to 3 business days. We ask that you follow-up with your pharmacy.

## 2021-07-22 NOTE — Telephone Encounter (Signed)
Requested Prescriptions  Pending Prescriptions Disp Refills  . empagliflozin (JARDIANCE) 25 MG TABS tablet 30 tablet 2    Sig: Take 1 tablet (25 mg total) by mouth daily.     Endocrinology:  Diabetes - SGLT2 Inhibitors Failed - 07/22/2021  3:32 PM      Failed - LDL in normal range and within 360 days    LDL Cholesterol (Calc)  Date Value Ref Range Status  03/22/2019 137 (H) mg/dL (calc) Final    Comment:    Reference range: <100 . Desirable range <100 mg/dL for primary prevention;   <70 mg/dL for patients with CHD or diabetic patients  with > or = 2 CHD risk factors. Marland Kitchen LDL-C is now calculated using the Martin-Hopkins  calculation, which is a validated novel method providing  better accuracy than the Friedewald equation in the  estimation of LDL-C.  Cresenciano Genre et al. Annamaria Helling. 3329;518(84): 2061-2068  (http://education.[facility].com/faq/FAQ164)          Failed - HBA1C is between 0 and 7.9 and within 180 days    Hemoglobin A1C  Date Value Ref Range Status  01/18/2021 7.8 (A) 4.0 - 5.6 % Final  01/06/2017 8.0  Final   Hgb A1c MFr Bld  Date Value Ref Range Status  04/08/2017 7.9 (H) <5.7 % Final    Comment:      For someone without known diabetes, a hemoglobin A1c value of 6.5% or greater indicates that they may have diabetes and this should be confirmed with a follow-up test.   For someone with known diabetes, a value <7% indicates that their diabetes is well controlled and a value greater than or equal to 7% indicates suboptimal control. A1c targets should be individualized based on duration of diabetes, age, comorbid conditions, and other considerations.   Currently, no consensus exists for use of hemoglobin A1c for diagnosis of diabetes for children.            Passed - Cr in normal range and within 360 days    Creat  Date Value Ref Range Status  06/04/2021 0.68 0.50 - 1.05 mg/dL Final         Passed - eGFR in normal range and within 360 days    GFR, Est  African American  Date Value Ref Range Status  03/22/2019 102 > OR = 60 mL/min/1.53m2 Final   GFR, Est Non African American  Date Value Ref Range Status  03/22/2019 88 > OR = 60 mL/min/1.33m2 Final   GFR, Estimated  Date Value Ref Range Status  08/21/2020 >60 >60 mL/min Final    Comment:    (NOTE) Calculated using the CKD-EPI Creatinine Equation (2021)    eGFR  Date Value Ref Range Status  06/04/2021 97 > OR = 60 mL/min/1.21m2 Final    Comment:    The eGFR is based on the CKD-EPI 2021 equation. To calculate  the new eGFR from a previous Creatinine or Cystatin C result, go to https://www.kidney.org/professionals/ kdoqi/gfr%5Fcalculator          Passed - Valid encounter within last 6 months    Recent Outpatient Visits          1 month ago Chronic right hip pain   [facility] [place], [place] W, NP   6 months ago DM type 2 with diabetic peripheral neuropathy [facility])   [facility], Coralie Keens, NP   10 months ago [facility] [place], Lupita Raider, [place]   11  months ago DM type 2 with diabetic peripheral neuropathy [facility])   [place], FNP   1 year ago Corneal abrasion of left eye due to contact lens   [facility], Lupita Raider, FNP

## 2021-08-15 ENCOUNTER — Ambulatory Visit: Payer: 59 | Admitting: Internal Medicine

## 2021-09-05 ENCOUNTER — Other Ambulatory Visit: Payer: Self-pay | Admitting: Internal Medicine

## 2021-09-05 ENCOUNTER — Other Ambulatory Visit: Payer: Self-pay | Admitting: Student in an Organized Health Care Education/Training Program

## 2021-09-05 DIAGNOSIS — M47816 Spondylosis without myelopathy or radiculopathy, lumbar region: Secondary | ICD-10-CM

## 2021-09-05 DIAGNOSIS — I10 Essential (primary) hypertension: Secondary | ICD-10-CM

## 2021-09-05 DIAGNOSIS — E1142 Type 2 diabetes mellitus with diabetic polyneuropathy: Secondary | ICD-10-CM

## 2021-09-05 DIAGNOSIS — M19011 Primary osteoarthritis, right shoulder: Secondary | ICD-10-CM

## 2021-09-05 NOTE — Telephone Encounter (Signed)
Requested Prescriptions  Pending Prescriptions Disp Refills   JARDIANCE 25 MG TABS tablet [Pharmacy Med Name: Jardiance 25 MG Oral Tablet] 90 tablet 0    Sig: Take 1 tablet by mouth once daily     Endocrinology:  Diabetes - SGLT2 Inhibitors Failed - 09/05/2021 10:15 AM      Failed - LDL in normal range and within 360 days    LDL Cholesterol (Calc)  Date Value Ref Range Status  03/22/2019 137 (H) mg/dL (calc) Final    Comment:    Reference range: <100 . Desirable range <100 mg/dL for primary prevention;   <70 mg/dL for patients with CHD or diabetic patients  with > or = 2 CHD risk factors. Marland Kitchen LDL-C is now calculated using the Martin-Hopkins  calculation, which is a validated novel method providing  better accuracy than the Friedewald equation in the  estimation of LDL-C.  Cresenciano Genre et al. Annamaria Helling. 1950;932(67): 2061-2068  (http://education.[facility].com/faq/FAQ164)          Failed - HBA1C is between 0 and 7.9 and within 180 days    Hemoglobin A1C  Date Value Ref Range Status  01/18/2021 7.8 (A) 4.0 - 5.6 % Final  01/06/2017 8.0  Final   Hgb A1c MFr Bld  Date Value Ref Range Status  04/08/2017 7.9 (H) <5.7 % Final    Comment:      For someone without known diabetes, a hemoglobin A1c value of 6.5% or greater indicates that they may have diabetes and this should be confirmed with a follow-up test.   For someone with known diabetes, a value <7% indicates that their diabetes is well controlled and a value greater than or equal to 7% indicates suboptimal control. A1c targets should be individualized based on duration of diabetes, age, comorbid conditions, and other considerations.   Currently, no consensus exists for use of hemoglobin A1c for diagnosis of diabetes for children.            Passed - Cr in normal range and within 360 days    Creat  Date Value Ref Range Status  06/04/2021 0.68 0.50 - 1.05 mg/dL Final         Passed - eGFR in normal range and within  360 days    GFR, Est African American  Date Value Ref Range Status  03/22/2019 102 > OR = 60 mL/min/1.89m2 Final   GFR, Est Non African American  Date Value Ref Range Status  03/22/2019 88 > OR = 60 mL/min/1.63m2 Final   GFR, Estimated  Date Value Ref Range Status  08/21/2020 >60 >60 mL/min Final    Comment:    (NOTE) Calculated using the CKD-EPI Creatinine Equation (2021)    eGFR  Date Value Ref Range Status  06/04/2021 97 > OR = 60 mL/min/1.14m2 Final    Comment:    The eGFR is based on the CKD-EPI 2021 equation. To calculate  the new eGFR from a previous Creatinine or Cystatin C result, go to https://www.kidney.org/professionals/ kdoqi/gfr%5Fcalculator          Passed - Valid encounter within last 6 months    Recent Outpatient Visits          3 months ago Chronic right hip pain   [facility] [place], [place] W, NP   7 months ago DM type 2 with diabetic peripheral neuropathy [facility])   [facility] [place], NP   11 months ago [facility] [place], [place],  FNP   1 year ago DM type 2 with diabetic peripheral neuropathy ([place])   [facility], Lupita Raider, FNP   1 year ago Corneal abrasion of left eye due to contact lens   [facility], Lupita Raider, FNP              LANTUS SOLOSTAR 100 UNIT/ML Solostar [employer] Med Name: Lantus SoloStar 100 UNIT/ML Subcutaneous Solution Pen-injector] 15 mL 0    Sig: INJECT 32 UNITS SUBCUTANEOUSLY AT BEDTIME     Endocrinology:  Diabetes - Insulins Failed - 09/05/2021 10:15 AM      Failed - HBA1C is between 0 and 7.9 and within 180 days    Hemoglobin A1C  Date Value Ref Range Status  01/18/2021 7.8 (A) 4.0 - 5.6 % Final  01/06/2017 8.0  Final   Hgb A1c MFr Bld  Date Value Ref Range Status  04/08/2017 7.9 (H) <5.7 % Final    Comment:      For someone without known diabetes, a hemoglobin A1c value of 6.5% or greater indicates that  they may have diabetes and this should be confirmed with a follow-up test.   For someone with known diabetes, a value <7% indicates that their diabetes is well controlled and a value greater than or equal to 7% indicates suboptimal control. A1c targets should be individualized based on duration of diabetes, age, comorbid conditions, and other considerations.   Currently, no consensus exists for use of hemoglobin A1c for diagnosis of diabetes for children.            Passed - Valid encounter within last 6 months    Recent Outpatient Visits          3 months ago Chronic right hip pain   [facility] [place], [place], NP   7 months ago DM type 2 with diabetic peripheral neuropathy [facility])   [place], Coralie Keens, NP   11 months ago [facility] [place], Lupita Raider, FNP   1 year ago DM type 2 with diabetic peripheral neuropathy [facility])   [facility], Lupita Raider, FNP   1 year ago Corneal abrasion of left eye due to contact lens   [facility], Lupita Raider, FNP              sitaGLIPtin (JANUVIA) 100 MG tablet 30 tablet 0    Sig: Take 1 tablet by mouth once daily     Endocrinology:  Diabetes - DPP-4 Inhibitors Failed - 09/05/2021 10:15 AM      Failed - HBA1C is between 0 and 7.9 and within 180 days    Hemoglobin A1C  Date Value Ref Range Status  01/18/2021 7.8 (A) 4.0 - 5.6 % Final  01/06/2017 8.0  Final   Hgb A1c MFr Bld  Date Value Ref Range Status  04/08/2017 7.9 (H) <5.7 % Final    Comment:      For someone without known diabetes, a hemoglobin A1c value of 6.5% or greater indicates that they may have diabetes and this should be confirmed with a follow-up test.   For someone with known diabetes, a value <7% indicates that their diabetes is well controlled and a value greater than or equal to 7% indicates suboptimal control. A1c targets should be individualized based on  duration of diabetes, age, comorbid conditions, and other considerations.   Currently, no consensus exists for  use of hemoglobin A1c for diagnosis of diabetes for children.            Passed - Cr in normal range and within 360 days    Creat  Date Value Ref Range Status  06/04/2021 0.68 0.50 - 1.05 mg/dL Final         Passed - Valid encounter within last 6 months    Recent Outpatient Visits          3 months ago Chronic right hip pain   [facility] [place], [place] W, NP   7 months ago DM type 2 with diabetic peripheral neuropathy [facility])   [place], Coralie Keens, NP   11 months ago [place], FNP   1 year ago DM type 2 with diabetic peripheral neuropathy [facility])   [facility], Lupita Raider, FNP   1 year ago Corneal abrasion of left eye due to contact lens   [facility], Lupita Raider, FNP              carvedilol (COREG) 25 MG tablet [Pharmacy Med Name: Carvedilol 25 MG Oral Tablet] 60 tablet 0    Sig: TAKE 1 TABLET BY MOUTH TWICE DAILY WITH A MEAL     Cardiovascular:  Beta Blockers Failed - 09/05/2021 10:15 AM      Failed - Last BP in normal range    BP Readings from Last 1 Encounters:  06/04/21 (!) 145/50         Passed - Last Heart Rate in normal range    Pulse Readings from Last 1 Encounters:  06/04/21 83         Passed - Valid encounter within last 6 months    Recent Outpatient Visits          3 months ago Chronic right hip pain   [facility] [place], [place] W, NP   7 months ago DM type 2 with diabetic peripheral neuropathy [facility])   [facility], Coralie Keens, NP   11 months ago [place], FNP   1 year ago DM type 2 with diabetic peripheral neuropathy [facility])   [facility], Lupita Raider, FNP   1 year ago Corneal abrasion of left eye due to contact lens    [facility], Lupita Raider, FNP              amLODipine (NORVASC) 10 MG tablet [Pharmacy Med Name: amLODIPine Besylate 10 MG Oral Tablet] 30 tablet 0    Sig: Take 1 tablet by mouth once daily     Cardiovascular:  Calcium Channel Blockers Failed - 09/05/2021 10:15 AM      Failed - Last BP in normal range    BP Readings from Last 1 Encounters:  06/04/21 (!) 145/50         Passed - Valid encounter within last 6 months    Recent Outpatient Visits          3 months ago Chronic right hip pain   [place], [place], NP   7 months ago DM type 2 with diabetic peripheral neuropathy [facility])   [facility], Coralie Keens, NP   11 months ago [place], FNP   1 year ago DM  type 2 with diabetic peripheral neuropathy [facility])   [place], FNP   1 year ago Corneal abrasion of left eye due to contact lens   [employer] [facility] [facility], Lupita Raider, FNP

## 2021-09-05 NOTE — Telephone Encounter (Signed)
Requested medication (s) are due for refill today: 6 weeks early  Requested medication (s) are on the active medication list: yes  Last refill:  07/23/21 for 3 month supply  Future visit scheduled: NO, NO SHOW 08/15/21  Notes to clinic:  requesting early, LDL from 03/2019, NO SHOW 08/15/21/ and no upcoming visit, please assess.   Requested Prescriptions  Pending Prescriptions Disp Refills   JARDIANCE 25 MG TABS tablet [Pharmacy Med Name: Jardiance 25 MG Oral Tablet] 90 tablet 0    Sig: Take 1 tablet by mouth once daily     Endocrinology:  Diabetes - SGLT2 Inhibitors Failed - 09/05/2021 10:15 AM      Failed - LDL in normal range and within 360 days    LDL Cholesterol (Calc)  Date Value Ref Range Status  03/22/2019 137 (H) mg/dL (calc) Final    Comment:    Reference range: <100 . Desirable range <100 mg/dL for primary prevention;   <70 mg/dL for patients with CHD or diabetic patients  with > or = 2 CHD risk factors. Marland Kitchen LDL-C is now calculated using the Martin-Hopkins  calculation, which is a validated novel method providing  better accuracy than the Friedewald equation in the  estimation of LDL-C.  Horald Pollen et al. Lenox Ahr. 5022;861(43): 2061-2068  (http://education.[facility].com/faq/FAQ164)           Failed - HBA1C is between 0 and 7.9 and within 180 days    Hemoglobin A1C  Date Value Ref Range Status  01/18/2021 7.8 (A) 4.0 - 5.6 % Final  01/06/2017 8.0  Final   Hgb A1c MFr Bld  Date Value Ref Range Status  04/08/2017 7.9 (H) <5.7 % Final    Comment:      For someone without known diabetes, a hemoglobin A1c value of 6.5% or greater indicates that they may have diabetes and this should be confirmed with a follow-up test.   For someone with known diabetes, a value <7% indicates that their diabetes is well controlled and a value greater than or equal to 7% indicates suboptimal control. A1c targets should be individualized based on duration of diabetes, age,  comorbid conditions, and other considerations.   Currently, no consensus exists for use of hemoglobin A1c for diagnosis of diabetes for children.             Passed - Cr in normal range and within 360 days    Creat  Date Value Ref Range Status  06/04/2021 0.68 0.50 - 1.05 mg/dL Final          Passed - eGFR in normal range and within 360 days    GFR, Est African American  Date Value Ref Range Status  03/22/2019 102 > OR = 60 mL/min/1.76m2 Final   GFR, Est Non African American  Date Value Ref Range Status  03/22/2019 88 > OR = 60 mL/min/1.53m2 Final   GFR, Estimated  Date Value Ref Range Status  08/21/2020 >60 >60 mL/min Final    Comment:    (NOTE) Calculated using the CKD-EPI Creatinine Equation (2021)    eGFR  Date Value Ref Range Status  06/04/2021 97 > OR = 60 mL/min/1.55m2 Final    Comment:    The eGFR is based on the CKD-EPI 2021 equation. To calculate  the new eGFR from a previous Creatinine or Cystatin C result, go to https://www.kidney.org/professionals/ kdoqi/gfr%5Fcalculator           Passed - Valid encounter within last 6 months    Recent Outpatient  Visits           3 months ago Chronic right hip pain   [facility] [place], [place], NP   7 months ago DM type 2 with diabetic peripheral neuropathy [facility])   [facility], Coralie Keens, NP   11 months ago [facility] [place], Lupita Raider, FNP   1 year ago DM type 2 with diabetic peripheral neuropathy [facility])   [facility], FNP   1 year ago Corneal abrasion of left eye due to contact lens   [facility], Lupita Raider, FNP              Signed Prescriptions Disp Refills   LANTUS SOLOSTAR 100 UNIT/ML Solostar Pen 15 mL 0    Sig: INJECT 32 UNITS SUBCUTANEOUSLY AT BEDTIME     Endocrinology:  Diabetes - Insulins Failed - 09/05/2021 10:15 AM      Failed - HBA1C is between 0 and 7.9 and within 180 days     Hemoglobin A1C  Date Value Ref Range Status  01/18/2021 7.8 (A) 4.0 - 5.6 % Final  01/06/2017 8.0  Final   Hgb A1c MFr Bld  Date Value Ref Range Status  04/08/2017 7.9 (H) <5.7 % Final    Comment:      For someone without known diabetes, a hemoglobin A1c value of 6.5% or greater indicates that they may have diabetes and this should be confirmed with a follow-up test.   For someone with known diabetes, a value <7% indicates that their diabetes is well controlled and a value greater than or equal to 7% indicates suboptimal control. A1c targets should be individualized based on duration of diabetes, age, comorbid conditions, and other considerations.   Currently, no consensus exists for use of hemoglobin A1c for diagnosis of diabetes for children.             Passed - Valid encounter within last 6 months    Recent Outpatient Visits           3 months ago Chronic right hip pain   [facility] [place], [place], NP   7 months ago DM type 2 with diabetic peripheral neuropathy [facility])   [facility], Coralie Keens, NP   11 months ago [facility] [place], Lupita Raider, FNP   1 year ago DM type 2 with diabetic peripheral neuropathy [facility])   [facility] [place], Lupita Raider, FNP   1 year ago Corneal abrasion of left eye due to contact lens   [facility], Lupita Raider, FNP               sitaGLIPtin (JANUVIA) 100 MG tablet 30 tablet 0    Sig: Take 1 tablet by mouth once daily     Endocrinology:  Diabetes - DPP-4 Inhibitors Failed - 09/05/2021 10:15 AM      Failed - HBA1C is between 0 and 7.9 and within 180 days    Hemoglobin A1C  Date Value Ref Range Status  01/18/2021 7.8 (A) 4.0 - 5.6 % Final  01/06/2017 8.0  Final   Hgb A1c MFr Bld  Date Value Ref Range Status  04/08/2017 7.9 (H) <5.7 % Final    Comment:      For someone without known diabetes, a hemoglobin A1c value of 6.5% or greater  indicates that  they may have diabetes and this should be confirmed with a follow-up test.   For someone with known diabetes, a value <7% indicates that their diabetes is well controlled and a value greater than or equal to 7% indicates suboptimal control. A1c targets should be individualized based on duration of diabetes, age, comorbid conditions, and other considerations.   Currently, no consensus exists for use of hemoglobin A1c for diagnosis of diabetes for children.             Passed - Cr in normal range and within 360 days    Creat  Date Value Ref Range Status  06/04/2021 0.68 0.50 - 1.05 mg/dL Final          Passed - Valid encounter within last 6 months    Recent Outpatient Visits           3 months ago Chronic right hip pain   [facility] [place], [place], NP   7 months ago DM type 2 with diabetic peripheral neuropathy [facility])   [facility], Coralie Keens, NP   11 months ago [place], FNP   1 year ago DM type 2 with diabetic peripheral neuropathy [facility])   [facility], Lupita Raider, FNP   1 year ago Corneal abrasion of left eye due to contact lens   [facility], Lupita Raider, FNP               carvedilol (COREG) 25 MG tablet 60 tablet 0    Sig: TAKE 1 TABLET BY MOUTH TWICE DAILY WITH A MEAL     Cardiovascular:  Beta Blockers Failed - 09/05/2021 10:15 AM      Failed - Last BP in normal range    BP Readings from Last 1 Encounters:  06/04/21 (!) 145/50          Passed - Last Heart Rate in normal range    Pulse Readings from Last 1 Encounters:  06/04/21 83          Passed - Valid encounter within last 6 months    Recent Outpatient Visits           3 months ago Chronic right hip pain   [facility] [place], [place], NP   7 months ago DM type 2 with diabetic peripheral neuropathy [facility])   [facility], Coralie Keens, NP   11 months ago [facility] [place], Lupita Raider, FNP   1 year ago DM type 2 with diabetic peripheral neuropathy [facility])   [facility], Lupita Raider, FNP   1 year ago Corneal abrasion of left eye due to contact lens   [facility], Lupita Raider, FNP               amLODipine (NORVASC) 10 MG tablet 30 tablet 0    Sig: Take 1 tablet by mouth once daily     Cardiovascular:  Calcium Channel Blockers Failed - 09/05/2021 10:15 AM      Failed - Last BP in normal range    BP Readings from Last 1 Encounters:  06/04/21 (!) 145/50          Passed - Valid encounter within last 6 months    Recent Outpatient Visits           3 months ago Chronic  right hip pain   [facility] [place], [place], NP   7 months ago DM type 2 with diabetic peripheral neuropathy [facility])   [facility], Coralie Keens, NP   11 months ago [place], FNP   1 year ago DM type 2 with diabetic peripheral neuropathy [facility])   [facility], Lupita Raider, FNP   1 year ago Corneal abrasion of left eye due to contact lens   [facility], Lupita Raider, FNP

## 2021-10-02 ENCOUNTER — Other Ambulatory Visit: Payer: Self-pay | Admitting: Internal Medicine

## 2021-10-02 DIAGNOSIS — E1142 Type 2 diabetes mellitus with diabetic polyneuropathy: Secondary | ICD-10-CM

## 2021-10-03 NOTE — Telephone Encounter (Signed)
Has refills left at same pharm Requested Prescriptions  Pending Prescriptions Disp Refills   JARDIANCE 25 MG TABS tablet [Pharmacy Med Name: Jardiance 25 MG Oral Tablet] 90 tablet 0    Sig: Take 1 tablet by mouth once daily     Endocrinology:  Diabetes - SGLT2 Inhibitors Failed - 10/02/2021  3:44 PM      Failed - HBA1C is between 0 and 7.9 and within 180 days    Hemoglobin A1C  Date Value Ref Range Status  01/18/2021 7.8 (A) 4.0 - 5.6 % Final  01/06/2017 8.0  Final   Hgb A1c MFr Bld  Date Value Ref Range Status  04/08/2017 7.9 (H) <5.7 % Final    Comment:      For someone without known diabetes, a hemoglobin A1c value of 6.5% or greater indicates that they may have diabetes and this should be confirmed with a follow-up test.   For someone with known diabetes, a value <7% indicates that their diabetes is well controlled and a value greater than or equal to 7% indicates suboptimal control. A1c targets should be individualized based on duration of diabetes, age, comorbid conditions, and other considerations.   Currently, no consensus exists for use of hemoglobin A1c for diagnosis of diabetes for children.            Passed - Cr in normal range and within 360 days    Creat  Date Value Ref Range Status  06/04/2021 0.68 0.50 - 1.05 mg/dL Final         Passed - eGFR in normal range and within 360 days    GFR, Est African American  Date Value Ref Range Status  03/22/2019 102 > OR = 60 mL/min/1.44m Final   GFR, Est Non African American  Date Value Ref Range Status  03/22/2019 88 > OR = 60 mL/min/1.796mFinal   GFR, Estimated  Date Value Ref Range Status  08/21/2020 >60 >60 mL/min Final    Comment:    (NOTE) Calculated using the CKD-EPI Creatinine Equation (2021)    eGFR  Date Value Ref Range Status  06/04/2021 97 > OR = 60 mL/min/1.7342minal    Comment:    The eGFR is based on the CKD-EPI 2021 equation. To calculate  the new eGFR from a previous Creatinine or  Cystatin C result, go to https://www.kidney.org/professionals/ kdoqi/gfr%5Fcalculator          Passed - Valid encounter within last 6 months    Recent Outpatient Visits          4 months ago [facility] right [facility]   8 months [facility] type [facility]   1 year ago [place]   1 year ago DM type 2 with diabetic peripheral [facility]   1 year ago Corneal abrasion of left eye due to contact lens   [facility]

## 2021-10-08 ENCOUNTER — Ambulatory Visit (INDEPENDENT_AMBULATORY_CARE_PROVIDER_SITE_OTHER): Payer: Medicare Other | Admitting: Internal Medicine

## 2021-10-08 ENCOUNTER — Encounter: Payer: Self-pay | Admitting: Internal Medicine

## 2021-10-08 ENCOUNTER — Other Ambulatory Visit: Payer: Self-pay

## 2021-10-08 VITALS — BP 135/58 | HR 79 | Temp 97.5°F | Ht 62.0 in | Wt 245.0 lb

## 2021-10-08 DIAGNOSIS — E1142 Type 2 diabetes mellitus with diabetic polyneuropathy: Secondary | ICD-10-CM

## 2021-10-08 DIAGNOSIS — I10 Essential (primary) hypertension: Secondary | ICD-10-CM | POA: Diagnosis not present

## 2021-10-08 DIAGNOSIS — M1990 Unspecified osteoarthritis, unspecified site: Secondary | ICD-10-CM

## 2021-10-08 DIAGNOSIS — E1169 Type 2 diabetes mellitus with other specified complication: Secondary | ICD-10-CM | POA: Diagnosis not present

## 2021-10-08 DIAGNOSIS — D75839 Thrombocytosis, unspecified: Secondary | ICD-10-CM

## 2021-10-08 DIAGNOSIS — G894 Chronic pain syndrome: Secondary | ICD-10-CM

## 2021-10-08 DIAGNOSIS — E785 Hyperlipidemia, unspecified: Secondary | ICD-10-CM

## 2021-10-08 DIAGNOSIS — O903 Peripartum cardiomyopathy: Secondary | ICD-10-CM

## 2021-10-08 LAB — POCT GLYCOSYLATED HEMOGLOBIN (HGB A1C): Hemoglobin A1C: 7.4 % — AB (ref 4.0–5.6)

## 2021-10-08 NOTE — Assessment & Plan Note (Signed)
Encourage diet and exercise for weight loss 

## 2021-10-08 NOTE — Assessment & Plan Note (Signed)
POCT A1c 7.2% Urine microalbumin today Encouraged her to consume a low-carb diet and exercise for weight loss Continue Jardiance, Januvia, Lantus and Gabapentin Encourage routine eye exams Encourage routine foot exams Flu, Pneumovax and Prevnar UTD She declines COVID-vaccine

## 2021-10-08 NOTE — Assessment & Plan Note (Signed)
Continue Voltaren, Gabapentin and Zanaflex She will continue to follow with Dr. Holley Raring

## 2021-10-08 NOTE — Progress Notes (Signed)
Subjective:    Patient ID: Stacy Martinez, female    DOB: [birth year], 66 y.o.   MRN: [id number]  HPI  Patient presents to clinic today for her follow-up of chronic conditions.  HTN with Cardiomyopathy: Her BP today is 135/58.  She is taking Amlodipine, Enalapril, Carvedilol and Furosemide as prescribed.  ECG from 08/2020 reviewed.  HLD: Her last LDL was 137, triglycerides 125, 03/2019.  She denies myalgias on Rosuvastatin.  She tries to consume a low-fat diet.  DM2: With Peripheral Neuropathy: Her last A1c was 7.8%, 12/2020.  She is taking Jardiance, Januvia and Lantus as prescribed.  Her sugars range 102-145.  She takes Gabapentin for neuropathy.  Her last eye exam was 11/2020.  Flu 06/2021.  Prevnar 06/2021.  Pneumovax 06/2019.  COVID: Never  OA/Chronic Pain: Mainly in her right shoulder, back, hips and knees.  She takes Voltaren, Gabapentin and Zanaflex as prescribed with some relief of symptoms.  She follows with pain management.  Thrombocytosis: Her last platelet count was 481, 08/2020.  She is not following with hematology.  Review of Systems     Past Medical History:  Diagnosis Date   Allergy    Arthritis    Diabetes mellitus without complication (HCC)    Enlarged heart    Hypertension     Current Outpatient Medications  Medication Sig Dispense Refill   amLODipine (NORVASC) 10 MG tablet Take 1 tablet by mouth once daily 30 tablet 0   carvedilol (COREG) 25 MG tablet TAKE 1 TABLET BY MOUTH TWICE DAILY WITH A MEAL 60 tablet 0   diclofenac (VOLTAREN) 75 MG EC tablet Take 1 tablet (75 mg total) by mouth daily as needed for moderate pain. 30 tablet 5   empagliflozin (JARDIANCE) 25 MG TABS tablet Take 1 tablet (25 mg total) by mouth daily. 30 tablet 2   enalapril (VASOTEC) 20 MG tablet Take 1 tablet by mouth twice daily 180 tablet 0   furosemide (LASIX) 40 MG tablet Take 2 tablets (80 mg total) by mouth daily. (Patient taking differently: Take 40 mg by mouth daily.) 180 tablet 0    gabapentin (NEURONTIN) 300 MG capsule Take 1 capsule by mouth at bedtime 90 capsule 0   glucose blood (RELION GLUCOSE TEST STRIPS) test strip Use as instructed 100 each 12   insulin glargine (LANTUS) 100 UNIT/ML injection Inject 0.32 mLs (32 Units total) into the skin at bedtime. 10 mL 1   LANTUS SOLOSTAR 100 UNIT/ML Solostar Pen INJECT 32 UNITS SUBCUTANEOUSLY AT BEDTIME 15 mL 0   nitrofurantoin, macrocrystal-monohydrate, (MACROBID) 100 MG capsule Take 1 capsule (100 mg total) by mouth 2 (two) times daily. 10 capsule 0   ReliOn Ultra Thin Lancets MISC 1 each by Does not apply route daily. 100 each 11   rosuvastatin (CRESTOR) 20 MG tablet Take 1 tablet (20 mg total) by mouth daily. (Patient taking differently: Take 20 mg by mouth at bedtime.) 90 tablet 1   sitaGLIPtin (JANUVIA) 100 MG tablet Take 1 tablet by mouth once daily 30 tablet 0   tiZANidine (ZANAFLEX) 4 MG tablet Take 1 tablet (4 mg total) by mouth every 8 (eight) hours as needed for muscle spasms. 90 tablet 5   ULTICARE MINI PEN NEEDLES 32G X 6 MM MISC 1 Bottle by Does not apply route as directed. 100 each 1   No current facility-administered medications for this visit.    Allergies  Allergen Reactions   Prednisone Swelling   Metformin And Related  Severe nausea and diarrhea even with extended release versions    Family History  Problem Relation Age of Onset   Diabetes Mother    Cancer Father    Mental illness Son     Social History   Socioeconomic History   Marital status: Divorced    Spouse name: Not on file   Number of children: 4   Years of education: Not on file   Highest education level: Not on file  Occupational History   Not on file  Tobacco Use   Smoking status: Never   Smokeless tobacco: Never  Vaping Use   Vaping Use: Never used  Substance and Sexual Activity   Alcohol use: No    Alcohol/week: 0.0 standard drinks   Drug use: No   Sexual activity: Not on file  Other Topics Concern   Not on file   Social History Narrative   Not on file   Social Determinants of Health   Financial Resource Strain: Not on file  Food Insecurity: Not on file  Transportation Needs: Not on file  Physical Activity: Not on file  Stress: Not on file  Social Connections: Not on file  Intimate Partner Violence: Not on file     Constitutional: Denies fever, malaise, fatigue, headache or abrupt weight changes.  HEENT: Denies eye pain, eye redness, ear pain, ringing in the ears, wax buildup, runny nose, nasal congestion, bloody nose, or sore throat. Respiratory: Denies difficulty breathing, shortness of breath, cough or sputum production.   Cardiovascular: Patient reports intermittent swelling in her lower extremities.  Denies chest pain, chest tightness, palpitations or swelling in the hands or feet.  Musculoskeletal: Patient reports chronic joint and muscle pain.  Denies decrease in range of motion, difficulty with gait,  or joint swelling.  Skin: Denies redness, rashes, lesions or ulcercations.  Neurological: Patient reports neuropathic pain.  Denies dizziness, difficulty with memory, difficulty with speech or problems with balance and coordination.    No other specific complaints in a complete review of systems (except as listed in HPI above).  Objective:   Physical Exam   BP (!) 135/58 (BP Location: Right Arm, Patient Position: Sitting, Cuff Size: Large)    Pulse 79    Temp (!) 97.5 F (36.4 C) (Temporal)    Ht 5\' 2"  (1.575 m)    Wt 245 lb (111.1 kg)    SpO2 98%    BMI 44.81 kg/m   Wt Readings from Last 3 Encounters:  06/04/21 252 lb 6.4 oz (114.5 kg)  03/20/21 240 lb (108.9 kg)  02/25/21 252 lb (114.3 kg)    General: Appears her stated age, obese, in NAD. Skin: Warm, dry and intact. No ulcerations noted. HEENT: Head: normal shape and size; Eyes: sclera white and EOMs intact;  Cardiovascular: Normal rate and rhythm. S1,S2 noted.  No murmur, rubs or gallops noted. No JVD or BLE edema. No  carotid bruits noted. Pulmonary/Chest: Normal effort and positive vesicular breath sounds. No respiratory distress. No wheezes, rales or ronchi noted.  Musculoskeletal:  No difficulty with gait.  Neurological: Alert and oriented.   BMET    Component Value Date/Time   NA 138 06/04/2021 0921   NA 136 11/30/2015 1037   K 4.4 06/04/2021 0921   CL 103 06/04/2021 0921   CO2 25 06/04/2021 0921   GLUCOSE 231 (H) 06/04/2021 0921   BUN 11 06/04/2021 0921   BUN 19 11/30/2015 1037   CREATININE 0.68 06/04/2021 0921   CALCIUM 9.5 06/04/2021 0921  GFRNONAA >60 08/21/2020 1137   GFRNONAA 88 03/22/2019 0909   GFRAA 102 03/22/2019 0909    Lipid Panel     Component Value Date/Time   CHOL 218 (H) 03/22/2019 0909   CHOL 182 11/30/2015 1037   TRIG 125 03/22/2019 0909   HDL 56 03/22/2019 0909   HDL 47 11/30/2015 1037   CHOLHDL 3.9 03/22/2019 0909   VLDL 33 (H) 04/08/2017 0857   LDLCALC 137 (H) 03/22/2019 0909    CBC    Component Value Date/Time   WBC 10.7 (H) 08/21/2020 1137   RBC 4.89 08/21/2020 1137   HGB 12.8 08/21/2020 1137   HGB 12.2 11/30/2015 1037   HCT 40.7 08/21/2020 1137   HCT 38.0 11/30/2015 1037   PLT 481 (H) 08/21/2020 1137   PLT 428 (H) 11/30/2015 1037   MCV 83.2 08/21/2020 1137   MCV 83 11/30/2015 1037   MCH 26.2 08/21/2020 1137   MCHC 31.4 08/21/2020 1137   RDW 14.6 08/21/2020 1137   RDW 15.1 11/30/2015 1037   LYMPHSABS 2.2 08/21/2020 1137   LYMPHSABS 3.1 11/30/2015 1037   MONOABS 0.7 08/21/2020 1137   EOSABS 0.8 (H) 08/21/2020 1137   EOSABS 0.4 11/30/2015 1037   BASOSABS 0.1 08/21/2020 1137   BASOSABS 0.1 11/30/2015 1037    Hgb A1C Lab Results  Component Value Date   HGBA1C 7.8 (A) 01/18/2021           Assessment & Plan:     Webb Silversmith, NP This visit occurred during the SARS-CoV-2 public health emergency.  Safety protocols were in place, including screening questions prior to the visit, additional usage of staff PPE, and extensive cleaning of  exam room while observing appropriate contact time as indicated for disinfecting solutions.

## 2021-10-08 NOTE — Patient Instructions (Signed)

## 2021-10-08 NOTE — Assessment & Plan Note (Signed)
Controlled on Amlodipine, Enalapril, Carvedilol and Furosemide Reinforced DASH diet and exercise for weight loss C-Met today

## 2021-10-08 NOTE — Assessment & Plan Note (Signed)
C-Met and lipid profile today Encouraged her to consume a low-fat diet Continue Rosuvastatin 

## 2021-10-08 NOTE — Assessment & Plan Note (Signed)
CBC today Advised her start baby Aspirin daily

## 2021-10-09 LAB — COMPLETE METABOLIC PANEL WITH GFR
AG Ratio: 1.3 (calc) (ref 1.0–2.5)
ALT: 20 U/L (ref 6–29)
AST: 17 U/L (ref 10–35)
Albumin: 4.1 g/dL (ref 3.6–5.1)
Alkaline phosphatase (APISO): 104 U/L (ref 37–153)
BUN: 15 mg/dL (ref 7–25)
CO2: 26 mmol/L (ref 20–32)
Calcium: 9.4 mg/dL (ref 8.6–10.4)
Chloride: 103 mmol/L (ref 98–110)
Creat: 0.77 mg/dL (ref 0.50–1.05)
Globulin: 3.2 g/dL (calc) (ref 1.9–3.7)
Glucose, Bld: 111 mg/dL — ABNORMAL HIGH (ref 65–99)
Potassium: 4.7 mmol/L (ref 3.5–5.3)
Sodium: 138 mmol/L (ref 135–146)
Total Bilirubin: 0.4 mg/dL (ref 0.2–1.2)
Total Protein: 7.3 g/dL (ref 6.1–8.1)
eGFR: 86 mL/min/{1.73_m2} (ref >=60)

## 2021-10-09 LAB — MICROALBUMIN / CREATININE URINE RATIO
Creatinine, Urine: 49 mg/dL (ref 20–275)
Microalb, Ur: <0.2 mg/dL

## 2021-10-09 LAB — CBC
HCT: 37.6 % (ref 35.0–45.0)
Hemoglobin: 12.1 g/dL (ref 11.7–15.5)
MCH: 25.5 pg — ABNORMAL LOW (ref 27.0–33.0)
MCHC: 32.2 g/dL (ref 32.0–36.0)
MCV: 79.3 fL — ABNORMAL LOW (ref 80.0–100.0)
MPV: 10.2 fL (ref 7.5–12.5)
Platelets: 486 10*3/uL — ABNORMAL HIGH (ref 140–400)
RBC: 4.74 10*6/uL (ref 3.80–5.10)
RDW: 14.9 % (ref 11.0–15.0)
WBC: 11.1 10*3/uL — ABNORMAL HIGH (ref 3.8–10.8)

## 2021-10-09 LAB — LIPID PANEL
Cholesterol: 191 mg/dL (ref <200)
HDL: 52 mg/dL (ref >=50)
LDL Cholesterol (Calc): 102 mg/dL (calc) — ABNORMAL HIGH
Non-HDL Cholesterol (Calc): 139 mg/dL (calc) — ABNORMAL HIGH (ref <130)
Total CHOL/HDL Ratio: 3.7 (calc) (ref <5.0)
Triglycerides: 257 mg/dL — ABNORMAL HIGH (ref <150)

## 2021-10-16 ENCOUNTER — Other Ambulatory Visit: Payer: Self-pay

## 2021-10-16 DIAGNOSIS — I10 Essential (primary) hypertension: Secondary | ICD-10-CM

## 2021-10-16 DIAGNOSIS — E1142 Type 2 diabetes mellitus with diabetic polyneuropathy: Secondary | ICD-10-CM

## 2021-10-16 MED ORDER — EMPAGLIFLOZIN 25 MG PO TABS: 25.0000 mg | ORAL_TABLET | Freq: Every day | ORAL | 1 refills | Status: DC

## 2021-10-16 MED ORDER — FENOFIBRATE 54 MG PO TABS: 54.0000 mg | ORAL_TABLET | Freq: Every day | ORAL | 1 refills | Status: DC

## 2021-10-16 MED ORDER — SITAGLIPTIN PHOSPHATE 100 MG PO TABS: ORAL_TABLET | ORAL | 1 refills | Status: DC

## 2021-10-16 MED ORDER — FUROSEMIDE 40 MG PO TABS: 40.0000 mg | ORAL_TABLET | Freq: Every day | ORAL | 1 refills | Status: DC

## 2021-10-16 NOTE — Telephone Encounter (Signed)
Pt advised.  She agreed to start fenofibrate.  Please send to [place].   Pt also needs refills of Januvia, Jardiance, and furosemide.   Thanks,   -Mickel Baas

## 2021-10-16 NOTE — Telephone Encounter (Signed)
-----   Message from Jearld Fenton, NP sent at 10/09/2021  5:30 PM EST ----- Platelets remain elevated.  I would recommend she start a baby aspirin daily.  blood counts otherwise stable.  Liver and kidney function is normal.  Cholesterol is reasonable however triglycerides are still too elevated.  Would she be willing to start fenofibrate 54 mcg in addition to her rosuvastatin and low saturated fat diet?  Diabetes is not affecting her kidneys.

## 2021-10-22 ENCOUNTER — Other Ambulatory Visit: Payer: Self-pay | Admitting: Internal Medicine

## 2021-10-22 DIAGNOSIS — I10 Essential (primary) hypertension: Secondary | ICD-10-CM

## 2021-10-23 ENCOUNTER — Other Ambulatory Visit: Payer: Self-pay | Admitting: Orthopedic Surgery

## 2021-10-23 NOTE — Telephone Encounter (Signed)
Requested Prescriptions  Pending Prescriptions Disp Refills   amLODipine (NORVASC) 10 MG tablet [Pharmacy Med Name: amLODIPine Besylate 10 MG Oral Tablet] 90 tablet 1    Sig: Take 1 tablet (10 mg total) by mouth daily.     Cardiovascular: Calcium Channel Blockers 2 Passed - 10/22/2021  4:40 PM      Passed - Last BP in normal range    BP Readings from Last 1 Encounters:  10/08/21 (!) 135/58         Passed - Last Heart Rate in normal range    Pulse Readings from Last 1 Encounters:  10/08/21 79         Passed - Valid encounter within last 6 months    Recent Outpatient Visits          2 weeks ago DM type 2 with diabetic peripheral neuropathy ([place])   [facility] [place], Coralie Keens, NP   4 months ago Chronic right hip pain   [facility] [place], Coralie Keens, NP   9 months ago DM type 2 with diabetic peripheral neuropathy [facility])   [facility], Coralie Keens, NP   1 year ago [place], FNP   1 year ago DM type 2 with diabetic peripheral neuropathy [facility])   [facility], Lupita Raider, FNP      Future Appointments            In 2 months Baity, Coralie Keens, NP [facility], [facility]            carvedilol (COREG) 25 MG tablet [Pharmacy Med Name: Carvedilol 25 MG Oral Tablet] 180 tablet 1    Sig: TAKE 1 TABLET BY MOUTH TWICE DAILY WITH MEALS     Cardiovascular: Beta Blockers 3 Passed - 10/22/2021  4:40 PM      Passed - Cr in normal range and within 360 days    Creat  Date Value Ref Range Status  10/08/2021 0.77 0.50 - 1.05 mg/dL Final   Creatinine, Urine  Date Value Ref Range Status  10/08/2021 49 20 - 275 mg/dL Final         Passed - AST in normal range and within 360 days    AST  Date Value Ref Range Status  10/08/2021 17 10 - 35 U/L Final         Passed - ALT in normal range and within 360 days    ALT  Date Value Ref Range Status  10/08/2021 20 6 - 29 U/L  Final         Passed - Last BP in normal range    BP Readings from Last 1 Encounters:  10/08/21 (!) 135/58         Passed - Last Heart Rate in normal range    Pulse Readings from Last 1 Encounters:  10/08/21 79         Passed - Valid encounter within last 6 months    Recent Outpatient Visits          2 weeks ago DM type 2 with diabetic peripheral neuropathy [facility])   [facility] [place], Coralie Keens, NP   4 months ago Chronic right hip pain   [facility] [place], [place] W, NP   9 months ago DM type 2 with diabetic peripheral neuropathy [facility])   [facility] [place], Coralie Keens, NP  1 year ago [place], FNP   1 year ago DM type 2 with diabetic peripheral neuropathy [facility])   [facility], Lupita Raider, FNP      Future Appointments            In 2 months Baity, Coralie Keens, NP [facility], [facility]

## 2021-11-05 ENCOUNTER — Other Ambulatory Visit: Payer: Self-pay | Admitting: Internal Medicine

## 2021-11-06 NOTE — Telephone Encounter (Signed)
Requested Prescriptions  ?Pending Prescriptions Disp Refills  ?? LANTUS SOLOSTAR 100 UNIT/ML Solostar Pen [Pharmacy Med Name: Lantus SoloStar 100 UNIT/ML Subcutaneous Solution Pen-injector] 15 mL 2  ?  Sig: INJECT 32 UNITS SUBCUTANEOUSLY AT BEDTIME  ?  ? Endocrinology:  Diabetes - Insulins Passed - 11/05/2021  4:42 PM  ?  ?  Passed - HBA1C is between 0 and 7.9 and within 180 days  ?  Hemoglobin A1C  ?Date Value Ref Range Status  ?10/08/2021 7.4 (A) 4.0 - 5.6 % Final  ?01/06/2017 8.0  Final  ? ?Hgb A1c MFr Bld  ?Date Value Ref Range Status  ?04/08/2017 7.9 (H) <5.7 % Final  ?  Comment:  ?    ?For someone without known diabetes, a hemoglobin A1c value of 6.5% or ?greater indicates that they may have diabetes and this should be ?confirmed with a follow-up test. ?  ?For someone with known diabetes, a value <7% indicates that their ?diabetes is well controlled and a value greater than or equal to 7% ?indicates suboptimal control. A1c targets should be individualized ?based on duration of diabetes, age, comorbid conditions, and other ?considerations. ?  ?Currently, no consensus exists for use of hemoglobin A1c for diagnosis ?of diabetes for children. ?  ?  ?   ?  ?  Passed - Valid encounter within last 6 months  ?  Recent Outpatient Visits   ?      ? 4 weeks ago DM type 2 with diabetic peripheral neuropathy ([place])  ? [facility] [place], [place] W, NP  ? 5 months ago Chronic right hip pain  ? [facility] [place], [place] W, NP  ? 9 months ago DM type 2 with diabetic peripheral neuropathy [facility])  ? [facility] [place], Coralie Keens, NP  ? 1 year ago Dysuria  ? [place], FNP  ? 1 year ago DM type 2 with diabetic peripheral neuropathy [facility])  ? [facility], Lupita Raider, FNP  ?  ?  ?Future Appointments   ?        ? In 2 months Baity, Coralie Keens, NP [place]  ?  ? ?  ?  ?  ?? gabapentin (NEURONTIN) 300 MG capsule  [Pharmacy Med Name: Gabapentin 300 MG Oral Capsule] 90 capsule 2  ?  Sig: Take 1 capsule by mouth at bedtime  ?  ? Neurology: Anticonvulsants - gabapentin Passed - 11/05/2021  4:42 PM  ?  ?  Passed - Cr in normal range and within 360 days  ?  Creat  ?Date Value Ref Range Status  ?10/08/2021 0.77 0.50 - 1.05 mg/dL Final  ? ?Creatinine, Urine  ?Date Value Ref Range Status  ?10/08/2021 49 20 - 275 mg/dL Final  ?   ?  ?  Passed - Completed PHQ-2 or PHQ-9 in the last 360 days  ?  ?  Passed - Valid encounter within last 12 months  ?  Recent Outpatient Visits   ?      ? 4 weeks ago DM type 2 with diabetic peripheral neuropathy ([place])  ? [facility] [place], [place] W, NP  ? 5 months ago Chronic right hip pain  ? [facility] [place], [place], NP  ? 9 months ago DM type 2 with diabetic peripheral neuropathy [facility])  ? [facility] [place], Coralie Keens, NP  ? 1 year ago Dysuria  ?  [place], FNP  ? 1 year ago DM type 2 with diabetic peripheral neuropathy [facility])  ? [facility], Lupita Raider, FNP  ?  ?  ?Future Appointments   ?        ? In 2 months Baity, Coralie Keens, NP [facility], [place]  ?  ? ?  ?  ?  ? ?

## 2021-11-15 ENCOUNTER — Other Ambulatory Visit: Payer: Self-pay

## 2021-11-15 ENCOUNTER — Other Ambulatory Visit: Payer: Self-pay | Admitting: Internal Medicine

## 2021-11-15 ENCOUNTER — Encounter
Admission: RE | Admit: 2021-11-15 | Discharge: 2021-11-15 | Disposition: A | Discharge status: Home / Self Care | Payer: Medicare Other | Source: Ambulatory Visit | Attending: Orthopedic Surgery | Admitting: Orthopedic Surgery

## 2021-11-15 VITALS — BP 153/86 | HR 71 | Temp 97.8°F | Resp 18 | Ht 62.0 in | Wt 244.2 lb

## 2021-11-15 DIAGNOSIS — Z01818 Encounter for other preprocedural examination: Secondary | ICD-10-CM | POA: Diagnosis present

## 2021-11-15 DIAGNOSIS — Z01810 Encounter for preprocedural cardiovascular examination: Secondary | ICD-10-CM | POA: Diagnosis not present

## 2021-11-15 LAB — CBC WITH DIFFERENTIAL/PLATELET
Abs Immature Granulocytes: 0.04 10*3/uL (ref 0.00–0.07)
Basophils Absolute: 0.1 10*3/uL (ref 0.0–0.1)
Basophils Relative: 1 %
Eosinophils Absolute: 0.6 10*3/uL — ABNORMAL HIGH (ref 0.0–0.5)
Eosinophils Relative: 6 %
HCT: 38.2 % (ref 36.0–46.0)
Hemoglobin: 11.9 g/dL — ABNORMAL LOW (ref 12.0–15.0)
Immature Granulocytes: 0 %
Lymphocytes Relative: 30 %
Lymphs Abs: 3 10*3/uL (ref 0.7–4.0)
MCH: 25.3 pg — ABNORMAL LOW (ref 26.0–34.0)
MCHC: 31.2 g/dL (ref 30.0–36.0)
MCV: 81.3 fL (ref 80.0–100.0)
Monocytes Absolute: 0.7 10*3/uL (ref 0.1–1.0)
Monocytes Relative: 7 %
Neutro Abs: 5.5 10*3/uL (ref 1.7–7.7)
Neutrophils Relative %: 56 %
Platelets: 456 10*3/uL — ABNORMAL HIGH (ref 150–400)
RBC: 4.7 MIL/uL (ref 3.87–5.11)
RDW: 15.6 % — ABNORMAL HIGH (ref 11.5–15.5)
WBC: 9.9 10*3/uL (ref 4.0–10.5)
nRBC: 0 % (ref 0.0–0.2)

## 2021-11-15 LAB — SURGICAL PCR SCREEN
MRSA, PCR: NEGATIVE
Staphylococcus aureus: POSITIVE — AB

## 2021-11-15 LAB — URINALYSIS, ROUTINE W REFLEX MICROSCOPIC
Bacteria, UA: NONE SEEN
Bilirubin Urine: NEGATIVE
Glucose, UA: >=500 mg/dL — AB
Hgb urine dipstick: NEGATIVE
Ketones, ur: NEGATIVE mg/dL
Leukocytes,Ua: NEGATIVE
Nitrite: NEGATIVE
Protein, ur: NEGATIVE mg/dL
Specific Gravity, Urine: 1.032 — ABNORMAL HIGH (ref 1.005–1.030)
pH: 5 (ref 5.0–8.0)

## 2021-11-15 LAB — COMPREHENSIVE METABOLIC PANEL
ALT: 21 U/L (ref 0–44)
AST: 24 U/L (ref 15–41)
Albumin: 3.8 g/dL (ref 3.5–5.0)
Alkaline Phosphatase: 95 U/L (ref 38–126)
Anion gap: 11 (ref 5–15)
BUN: 22 mg/dL (ref 8–23)
CO2: 26 mmol/L (ref 22–32)
Calcium: 9.3 mg/dL (ref 8.9–10.3)
Chloride: 103 mmol/L (ref 98–111)
Creatinine, Ser: 0.84 mg/dL (ref 0.44–1.00)
GFR, Estimated: >60 mL/min (ref >60)
Glucose, Bld: 191 mg/dL — ABNORMAL HIGH (ref 70–99)
Potassium: 4.5 mmol/L (ref 3.5–5.1)
Sodium: 140 mmol/L (ref 135–145)
Total Bilirubin: 0.4 mg/dL (ref 0.3–1.2)
Total Protein: 8.2 g/dL — ABNORMAL HIGH (ref 6.5–8.1)

## 2021-11-15 NOTE — Patient Instructions (Addendum)
Your procedure is scheduled on: Friday 11/29/21 ?Report to the Registration Desk on the 1st floor of the [facility]. ?To find out your arrival time, please call [phone] between 1PM - 3PM on: Thursday 11/28/21 ? ?REMEMBER: ?Instructions that are not followed completely may result in serious medical risk, up to and including death; or upon the discretion of your surgeon and anesthesiologist your surgery may need to be rescheduled. ? ?Do not eat food after midnight the night before surgery.  ?No gum chewing, lozengers or hard candies. ? ?You may however, drink water up to 2 hours before you are scheduled to arrive for your surgery. Do not drink anything within 2 hours of your scheduled arrival time. ? ?Type 1 and Type 2 diabetics should only drink water. ? ?TAKE THESE MEDICATIONS THE MORNING OF SURGERY WITH A SIP OF WATER: ?amLODipine (NORVASC) 10 MG tablet ?carvedilol (COREG) 25 MG tablet ?rosuvastatin (CRESTOR) 20 MG tablet ? ?Stop Jardiance 3 days prior to surgery. ? ?Take half of the bedtime Lantus which is 16 units.  ? ?Patient does not need to stop Aspirin except day of surgery. ? ?One week prior to surgery: ?Stop Anti-inflammatories (NSAIDS) such as , Advil, Aleve, Ibuprofen, Motrin, Naproxen, Naprosyn and Aspirin based products such as Excedrin, Goodys Powder, BC Powder. ? ?Stop ANY OVER THE COUNTER supplements until after surgery. ? ?You may however, continue to take Tylenol if needed for pain up until the day of surgery. ? ?No Alcohol for 24 hours before or after surgery. ? ?No Smoking including e-cigarettes for 24 hours prior to surgery.  ?No chewable tobacco products for at least 6 hours prior to surgery.  ?No nicotine patches on the day of surgery. ? ?Do not use any "recreational" drugs for at least a week prior to your surgery.  ?Please be advised that the combination of cocaine and anesthesia may have negative outcomes, up to and including death. ?If you test positive for cocaine, your surgery  will be cancelled. ? ?On the morning of surgery brush your teeth with toothpaste and water, you may rinse your mouth with mouthwash if you wish. ?Do not swallow any toothpaste or mouthwash. ? ?Use CHG Soap as directed on instruction sheet. ? ?Do not wear jewelry, make-up, hairpins, clips or nail polish. ? ?Do not wear lotions, powders, or perfumes.  ? ?Do not shave body from the neck down 48 hours prior to surgery just in case you cut yourself which could leave a site for infection.  ?Also, freshly shaved skin may become irritated if using the CHG soap. ? ?Do not bring valuables to the hospital. [facility] is not responsible for any missing/lost belongings or valuables.  ? ?Notify your doctor if there is any change in your medical condition (cold, fever, infection). ? ?Wear comfortable clothing (specific to your surgery type) to the hospital. ? ?After surgery, you can help prevent lung complications by doing breathing exercises.  ?Take deep breaths and cough every 1-2 hours. Your doctor may order a device called an Incentive Spirometer to help you take deep breaths. ? ?If you are being admitted to the hospital overnight, leave your suitcase in the car. ?After surgery it may be brought to your room. ? ?If you are taking public transportation, you will need to have a responsible adult (18 years or older) with you. ?Please confirm with your physician that it is acceptable to use public transportation.  ? ?Please call the [place] Dept. at [phone] if you have any  questions about these instructions. ? ?Surgery Visitation Policy: ? ?Patients undergoing a surgery or procedure may have one family member or support person with them as long as that person is not COVID-19 positive or experiencing its symptoms.  ?That person may remain in the waiting area during the procedure and may rotate out with other people. ? ?Inpatient Visitation:   ? ?Visiting hours are 7 a.m. to 8 p.m. ?Up to two visitors ages 16+  are allowed at one time in a patient room. The visitors may rotate out with other people during the day. Visitors must check out when they leave, or other visitors will not be allowed. One designated support person may remain overnight. ?The visitor must pass COVID-19 screenings, use hand sanitizer when entering and exiting the patient?s room and wear a mask at all times, including in the patient?s room. ?Patients must also wear a mask when staff or their visitor are in the room. ?Masking is required regardless of vaccination status.  ?

## 2021-11-15 NOTE — Telephone Encounter (Signed)
?  Notes to clinic:  Pharmacy requests as follows: Pharmacy comment: THIS MEDICATION IS NOT COVERED ON INSURANCE. PREFERRED ALTERNATIVES INCLUDE LEVEMIR AND BASAGLAR. PLEASE ADVISE. THANKS! ? ? ?  ? ?Requested Prescriptions  ?Pending Prescriptions Disp Refills  ? LANTUS SOLOSTAR 100 UNIT/ML Solostar Pen [Pharmacy Med Name: LANTUS SOLOSTAR     INJ] 15 mL 2  ?  Sig: INJECT 32 UNITS SUBCUTANEOUSLY AT BEDTIME  ?  ? Endocrinology:  Diabetes - Insulins Passed - 11/15/2021  8:20 AM  ?  ?  Passed - HBA1C is between 0 and 7.9 and within 180 days  ?  Hemoglobin A1C  ?Date Value Ref Range Status  ?10/08/2021 7.4 (A) 4.0 - 5.6 % Final  ?01/06/2017 8.0  Final  ? ?Hgb A1c MFr Bld  ?Date Value Ref Range Status  ?04/08/2017 7.9 (H) <5.7 % Final  ?  Comment:  ?    ?For someone without known diabetes, a hemoglobin A1c value of 6.5% or ?greater indicates that they may have diabetes and this should be ?confirmed with a follow-up test. ?  ?For someone with known diabetes, a value <7% indicates that their ?diabetes is well controlled and a value greater than or equal to 7% ?indicates suboptimal control. A1c targets should be individualized ?based on duration of diabetes, age, comorbid conditions, and other ?considerations. ?  ?Currently, no consensus exists for use of hemoglobin A1c for diagnosis ?of diabetes for children. ?  ?  ?  ?  ?  ?  Passed - Valid encounter within last 6 months  ?  Recent Outpatient Visits   ? ?      ? 1 month ago DM type 2 with diabetic peripheral neuropathy ([place])  ? [facility] [place], [place], NP  ? 5 months ago Chronic right hip pain  ? [facility] [place], [place] W, NP  ? 10 months ago DM type 2 with diabetic peripheral neuropathy [facility])  ? [facility] [place], Coralie Keens, NP  ? 1 year ago Dysuria  ? [place], FNP  ? 1 year ago DM type 2 with diabetic peripheral neuropathy [facility])  ? [facility], Lupita Raider, FNP  ? ?   ?  ?Future Appointments   ? ?        ? In 2 months Baity, Coralie Keens, NP [facility], [place]  ? ?  ? ?  ?  ?  ? ? ?

## 2021-11-16 LAB — TYPE AND SCREEN
ABO/RH(D): O POS
Antibody Screen: NEGATIVE

## 2021-11-27 ENCOUNTER — Other Ambulatory Visit: Payer: Medicare Other

## 2021-11-28 MED ORDER — SODIUM CHLORIDE 0.9 % IV SOLN: INTRAVENOUS | Status: DC

## 2021-11-28 MED ORDER — ORAL CARE MOUTH RINSE: 15.0000 mL | Freq: Once | OROMUCOSAL | Status: AC

## 2021-11-28 MED ORDER — CHLORHEXIDINE GLUCONATE 0.12 % MT SOLN: 15.0000 mL | Freq: Once | OROMUCOSAL | Status: AC

## 2021-11-28 MED ORDER — CEFAZOLIN SODIUM-DEXTROSE 2-4 GM/100ML-% IV SOLN
2.0000 g | INTRAVENOUS | Status: AC
  Administered 2021-11-29: 2 g via INTRAVENOUS

## 2021-11-28 MED ORDER — FAMOTIDINE 20 MG PO TABS
20.0000 mg | ORAL_TABLET | Freq: Once | ORAL | Status: AC
Start: 2021-11-28 — End: 2021-11-29

## 2021-11-29 ENCOUNTER — Ambulatory Visit: Payer: Medicare Other

## 2021-11-29 ENCOUNTER — Ambulatory Visit: Payer: Medicare Other | Admitting: Registered Nurse

## 2021-11-29 ENCOUNTER — Observation Stay
Admission: RE | Admit: 2021-11-29 | Discharge: 2021-12-01 | Disposition: A | Discharge status: Home Health | Payer: Medicare Other | Attending: Orthopedic Surgery | Admitting: Orthopedic Surgery

## 2021-11-29 ENCOUNTER — Encounter
Admission: RE | Disposition: A | Discharge status: Home Health | Payer: Self-pay | Source: Home / Self Care | Attending: Orthopedic Surgery

## 2021-11-29 ENCOUNTER — Other Ambulatory Visit: Payer: Self-pay

## 2021-11-29 ENCOUNTER — Ambulatory Visit: Payer: Medicare Other | Admitting: Urgent Care

## 2021-11-29 ENCOUNTER — Observation Stay: Payer: Medicare Other

## 2021-11-29 ENCOUNTER — Encounter: Payer: Self-pay | Admitting: Orthopedic Surgery

## 2021-11-29 DIAGNOSIS — E1140 Type 2 diabetes mellitus with diabetic neuropathy, unspecified: Secondary | ICD-10-CM | POA: Diagnosis not present

## 2021-11-29 DIAGNOSIS — Z794 Long term (current) use of insulin: Secondary | ICD-10-CM | POA: Diagnosis not present

## 2021-11-29 DIAGNOSIS — I10 Essential (primary) hypertension: Secondary | ICD-10-CM | POA: Insufficient documentation

## 2021-11-29 DIAGNOSIS — Z96641 Presence of right artificial hip joint: Secondary | ICD-10-CM

## 2021-11-29 DIAGNOSIS — D7589 Other specified diseases of blood and blood-forming organs: Secondary | ICD-10-CM | POA: Diagnosis not present

## 2021-11-29 DIAGNOSIS — M1611 Unilateral primary osteoarthritis, right hip: Secondary | ICD-10-CM | POA: Diagnosis present

## 2021-11-29 DIAGNOSIS — Z7984 Long term (current) use of oral hypoglycemic drugs: Secondary | ICD-10-CM | POA: Insufficient documentation

## 2021-11-29 DIAGNOSIS — Z79899 Other long term (current) drug therapy: Secondary | ICD-10-CM | POA: Insufficient documentation

## 2021-11-29 HISTORY — PX: TOTAL HIP ARTHROPLASTY: SHX124

## 2021-11-29 LAB — CBC
HCT: 34.2 % — ABNORMAL LOW (ref 36.0–46.0)
Hemoglobin: 10.7 g/dL — ABNORMAL LOW (ref 12.0–15.0)
MCH: 25.4 pg — ABNORMAL LOW (ref 26.0–34.0)
MCHC: 31.3 g/dL (ref 30.0–36.0)
MCV: 81 fL (ref 80.0–100.0)
Platelets: 406 10*3/uL — ABNORMAL HIGH (ref 150–400)
RBC: 4.22 MIL/uL (ref 3.87–5.11)
RDW: 15.2 % (ref 11.5–15.5)
WBC: 15.5 10*3/uL — ABNORMAL HIGH (ref 4.0–10.5)
nRBC: 0 % (ref 0.0–0.2)

## 2021-11-29 LAB — GLUCOSE, CAPILLARY
Glucose-Capillary: 144 mg/dL — ABNORMAL HIGH (ref 70–99)
Glucose-Capillary: 158 mg/dL — ABNORMAL HIGH (ref 70–99)
Glucose-Capillary: 167 mg/dL — ABNORMAL HIGH (ref 70–99)
Glucose-Capillary: 138 mg/dL — ABNORMAL HIGH (ref 70–99)

## 2021-11-29 LAB — CREATININE, SERUM
Creatinine, Ser: 0.78 mg/dL (ref 0.44–1.00)
GFR, Estimated: >60 mL/min (ref >60)

## 2021-11-29 SURGERY — ARTHROPLASTY, HIP, TOTAL, ANTERIOR APPROACH
Anesthesia: Spinal | Site: Hip | Laterality: Right

## 2021-11-29 MED ORDER — HYDROMORPHONE HCL 1 MG/ML IJ SOLN
0.5000 mg | INTRAMUSCULAR | Status: DC | PRN
  Administered 2021-11-30: 0.5 mg via INTRAVENOUS
  Filled 2021-11-29: qty 1

## 2021-11-29 MED ORDER — EPHEDRINE SULFATE (PRESSORS) 50 MG/ML IJ SOLN
INTRAMUSCULAR | Status: DC | PRN
  Administered 2021-11-29: 10 mg via INTRAVENOUS
  Administered 2021-11-29: 5 mg via INTRAVENOUS
  Administered 2021-11-29: 10 mg via INTRAVENOUS

## 2021-11-29 MED ORDER — INSULIN ASPART 100 UNIT/ML IJ SOLN
0.0000 [IU] | Freq: Three times a day (TID) | INTRAMUSCULAR | Status: DC
  Administered 2021-11-30 – 2021-12-01 (×3): 2 [IU] via SUBCUTANEOUS
  Filled 2021-11-29 (×3): qty 1

## 2021-11-29 MED ORDER — BUPIVACAINE HCL (PF) 0.5 % IJ SOLN
INTRAMUSCULAR | Status: DC | PRN
Start: 2021-11-29 — End: 2021-11-29
  Administered 2021-11-29: 2.3 mL via INTRATHECAL

## 2021-11-29 MED ORDER — ENOXAPARIN SODIUM 40 MG/0.4ML IJ SOSY
40.0000 mg | PREFILLED_SYRINGE | INTRAMUSCULAR | Status: DC
  Administered 2021-11-30 – 2021-12-01 (×2): 40 mg via SUBCUTANEOUS
  Filled 2021-11-29 (×2): qty 0.4

## 2021-11-29 MED ORDER — FLEET ENEMA 7-19 GM/118ML RE ENEM: 1.0000 | ENEMA | Freq: Once | RECTAL | Status: DC | PRN

## 2021-11-29 MED ORDER — PHENYLEPHRINE HCL-NACL 20-0.9 MG/250ML-% IV SOLN
INTRAVENOUS | Status: DC | PRN
  Administered 2021-11-29: 50 ug/min via INTRAVENOUS

## 2021-11-29 MED ORDER — CEFAZOLIN SODIUM-DEXTROSE 2-4 GM/100ML-% IV SOLN
2.0000 g | Freq: Four times a day (QID) | INTRAVENOUS | Status: AC
  Administered 2021-11-29 – 2021-11-30 (×2): 2 g via INTRAVENOUS
  Filled 2021-11-29 (×2): qty 100

## 2021-11-29 MED ORDER — PROPOFOL 10 MG/ML IV BOLUS
INTRAVENOUS | Status: DC | PRN
  Administered 2021-11-29 (×2): 20 mg via INTRAVENOUS
  Administered 2021-11-29: 10 mg via INTRAVENOUS

## 2021-11-29 MED ORDER — ALUM & MAG HYDROXIDE-SIMETH 200-200-20 MG/5ML PO SUSP: 30.0000 mL | ORAL | Status: DC | PRN

## 2021-11-29 MED ORDER — OXYCODONE HCL 5 MG PO TABS
10.0000 mg | ORAL_TABLET | ORAL | Status: DC | PRN
  Administered 2021-11-29 – 2021-11-30 (×2): 10 mg via ORAL
  Filled 2021-11-29 (×2): qty 2

## 2021-11-29 MED ORDER — BISACODYL 5 MG PO TBEC
5.0000 mg | DELAYED_RELEASE_TABLET | Freq: Every day | ORAL | Status: DC | PRN
  Administered 2021-12-01: 5 mg via ORAL
  Filled 2021-11-29: qty 1

## 2021-11-29 MED ORDER — METOCLOPRAMIDE HCL 10 MG PO TABS: 5.0000 mg | ORAL_TABLET | Freq: Three times a day (TID) | ORAL | Status: DC | PRN

## 2021-11-29 MED ORDER — PROPOFOL 500 MG/50ML IV EMUL
INTRAVENOUS | Status: DC | PRN
Start: 2021-11-29 — End: 2021-11-29
  Administered 2021-11-29: 75 ug/kg/min via INTRAVENOUS

## 2021-11-29 MED ORDER — TRAMADOL HCL 50 MG PO TABS
50.0000 mg | ORAL_TABLET | Freq: Four times a day (QID) | ORAL | Status: DC
  Administered 2021-11-29 – 2021-12-01 (×8): 50 mg via ORAL
  Filled 2021-11-29 (×8): qty 1

## 2021-11-29 MED ORDER — FENOFIBRATE 54 MG PO TABS
54.0000 mg | ORAL_TABLET | Freq: Every day | ORAL | Status: DC
Start: 2021-11-30 — End: 2021-12-01
  Administered 2021-11-30 – 2021-12-01 (×2): 54 mg via ORAL
  Filled 2021-11-29 (×2): qty 1

## 2021-11-29 MED ORDER — GABAPENTIN 300 MG PO CAPS
300.0000 mg | ORAL_CAPSULE | Freq: Every day | ORAL | Status: DC
  Administered 2021-11-29 – 2021-11-30 (×2): 300 mg via ORAL
  Filled 2021-11-29 (×2): qty 1

## 2021-11-29 MED ORDER — DIPHENHYDRAMINE HCL 12.5 MG/5ML PO ELIX: 12.5000 mg | ORAL_SOLUTION | ORAL | Status: DC | PRN

## 2021-11-29 MED ORDER — METHOCARBAMOL 500 MG PO TABS: 500.0000 mg | ORAL_TABLET | Freq: Four times a day (QID) | ORAL | Status: DC | PRN

## 2021-11-29 MED ORDER — GLYCOPYRROLATE 0.2 MG/ML IJ SOLN
INTRAMUSCULAR | Status: AC
  Filled 2021-11-29: qty 1

## 2021-11-29 MED ORDER — BUPIVACAINE-EPINEPHRINE (PF) 0.25% -1:200000 IJ SOLN
INTRAMUSCULAR | Status: AC
  Filled 2021-11-29: qty 30

## 2021-11-29 MED ORDER — ACETAMINOPHEN 325 MG PO TABS
325.0000 mg | ORAL_TABLET | Freq: Four times a day (QID) | ORAL | Status: DC | PRN
  Administered 2021-11-30: 650 mg via ORAL
  Filled 2021-11-29: qty 2

## 2021-11-29 MED ORDER — ONDANSETRON HCL 4 MG/2ML IJ SOLN
4.0000 mg | Freq: Four times a day (QID) | INTRAMUSCULAR | Status: DC | PRN
  Administered 2021-11-29 – 2021-11-30 (×2): 4 mg via INTRAVENOUS
  Filled 2021-11-29 (×2): qty 2

## 2021-11-29 MED ORDER — TIZANIDINE HCL 4 MG PO TABS
4.0000 mg | ORAL_TABLET | Freq: Three times a day (TID) | ORAL | Status: DC | PRN
  Filled 2021-11-29: qty 1

## 2021-11-29 MED ORDER — KETAMINE HCL 50 MG/5ML IJ SOSY
PREFILLED_SYRINGE | INTRAMUSCULAR | Status: AC
  Filled 2021-11-29: qty 5

## 2021-11-29 MED ORDER — KETAMINE HCL 10 MG/ML IJ SOLN
INTRAMUSCULAR | Status: DC | PRN
  Administered 2021-11-29 (×2): 20 mg via INTRAVENOUS
  Administered 2021-11-29: 10 mg via INTRAVENOUS

## 2021-11-29 MED ORDER — SODIUM CHLORIDE 0.9 % IV SOLN: INTRAVENOUS | Status: DC

## 2021-11-29 MED ORDER — ACETAMINOPHEN 10 MG/ML IV SOLN: 1000.0000 mg | Freq: Once | INTRAVENOUS | Status: DC | PRN

## 2021-11-29 MED ORDER — POLYETHYLENE GLYCOL 3350 17 G PO PACK
17.0000 g | PACK | Freq: Every day | ORAL | Status: DC | PRN
  Administered 2021-11-30: 17 g via ORAL
  Filled 2021-11-29: qty 1

## 2021-11-29 MED ORDER — FENTANYL CITRATE (PF) 100 MCG/2ML IJ SOLN
INTRAMUSCULAR | Status: AC
Start: 2021-11-29 — End: 2021-11-30
  Filled 2021-11-29: qty 2

## 2021-11-29 MED ORDER — PHENOL 1.4 % MT LIQD: 1.0000 | OROMUCOSAL | Status: DC | PRN

## 2021-11-29 MED ORDER — GLYCOPYRROLATE 0.2 MG/ML IJ SOLN
INTRAMUSCULAR | Status: DC | PRN
  Administered 2021-11-29: .2 mg via INTRAVENOUS

## 2021-11-29 MED ORDER — ACETAMINOPHEN 10 MG/ML IV SOLN
INTRAVENOUS | Status: DC | PRN
  Administered 2021-11-29: 1000 mg via INTRAVENOUS

## 2021-11-29 MED ORDER — CARVEDILOL 25 MG PO TABS
25.0000 mg | ORAL_TABLET | Freq: Two times a day (BID) | ORAL | Status: DC
  Administered 2021-11-29 – 2021-12-01 (×4): 25 mg via ORAL
  Filled 2021-11-29 (×4): qty 1

## 2021-11-29 MED ORDER — CHLORHEXIDINE GLUCONATE 0.12 % MT SOLN
OROMUCOSAL | Status: AC
  Administered 2021-11-29: 15 mL via OROMUCOSAL
  Filled 2021-11-29: qty 15

## 2021-11-29 MED ORDER — FENTANYL CITRATE (PF) 100 MCG/2ML IJ SOLN
25.0000 ug | INTRAMUSCULAR | Status: DC | PRN
  Administered 2021-11-29: 25 ug via INTRAVENOUS
  Administered 2021-11-29: 50 ug via INTRAVENOUS
  Administered 2021-11-29: 25 ug via INTRAVENOUS

## 2021-11-29 MED ORDER — VASOPRESSIN 20 UNIT/ML IV SOLN
INTRAVENOUS | Status: DC | PRN
  Administered 2021-11-29 (×4): 1 [IU] via INTRAVENOUS

## 2021-11-29 MED ORDER — ZOLPIDEM TARTRATE 5 MG PO TABS: 5.0000 mg | ORAL_TABLET | Freq: Every evening | ORAL | Status: DC | PRN

## 2021-11-29 MED ORDER — OXYCODONE HCL 5 MG PO TABS
5.0000 mg | ORAL_TABLET | ORAL | Status: DC | PRN
  Administered 2021-11-30 – 2021-12-01 (×4): 10 mg via ORAL
  Filled 2021-11-29 (×5): qty 2

## 2021-11-29 MED ORDER — METOCLOPRAMIDE HCL 5 MG/ML IJ SOLN: 5.0000 mg | Freq: Three times a day (TID) | INTRAMUSCULAR | Status: DC | PRN

## 2021-11-29 MED ORDER — OXYCODONE HCL 5 MG PO TABS: 5.0000 mg | ORAL_TABLET | Freq: Once | ORAL | Status: DC | PRN

## 2021-11-29 MED ORDER — ROSUVASTATIN CALCIUM 10 MG PO TABS
20.0000 mg | ORAL_TABLET | Freq: Every day | ORAL | Status: DC
  Administered 2021-11-30 – 2021-12-01 (×2): 20 mg via ORAL
  Filled 2021-11-29 (×2): qty 2

## 2021-11-29 MED ORDER — METHOCARBAMOL 1000 MG/10ML IJ SOLN
500.0000 mg | Freq: Four times a day (QID) | INTRAVENOUS | Status: DC | PRN
  Filled 2021-11-29: qty 5

## 2021-11-29 MED ORDER — ONDANSETRON HCL 4 MG/2ML IJ SOLN: 4.0000 mg | Freq: Once | INTRAMUSCULAR | Status: DC | PRN

## 2021-11-29 MED ORDER — MENTHOL 3 MG MT LOZG: 1.0000 | LOZENGE | OROMUCOSAL | Status: DC | PRN

## 2021-11-29 MED ORDER — SODIUM CHLORIDE (PF) 0.9 % IJ SOLN
INTRAMUSCULAR | Status: AC
  Filled 2021-11-29: qty 20

## 2021-11-29 MED ORDER — ENALAPRIL MALEATE 20 MG PO TABS
20.0000 mg | ORAL_TABLET | Freq: Two times a day (BID) | ORAL | Status: DC
  Administered 2021-11-29 – 2021-12-01 (×4): 20 mg via ORAL
  Filled 2021-11-29 (×4): qty 1

## 2021-11-29 MED ORDER — NEOMYCIN-POLYMYXIN B GU 40-200000 IR SOLN
Status: AC
  Filled 2021-11-29: qty 20

## 2021-11-29 MED ORDER — LINAGLIPTIN 5 MG PO TABS
5.0000 mg | ORAL_TABLET | Freq: Every day | ORAL | Status: DC
Start: 2021-11-29 — End: 2021-12-01
  Administered 2021-11-29 – 2021-12-01 (×3): 5 mg via ORAL
  Filled 2021-11-29 (×3): qty 1

## 2021-11-29 MED ORDER — NEOMYCIN-POLYMYXIN B GU 40-200000 IR SOLN
Status: DC | PRN
  Administered 2021-11-29: 4 mL

## 2021-11-29 MED ORDER — PROPOFOL 10 MG/ML IV BOLUS
INTRAVENOUS | Status: AC
  Filled 2021-11-29: qty 40

## 2021-11-29 MED ORDER — VASOPRESSIN 20 UNIT/ML IV SOLN
INTRAVENOUS | Status: AC
Start: 2021-11-29 — End: ?
  Filled 2021-11-29: qty 1

## 2021-11-29 MED ORDER — INSULIN GLARGINE-YFGN 100 UNIT/ML ~~LOC~~ SOLN
40.0000 [IU] | Freq: Every day | SUBCUTANEOUS | Status: DC
  Administered 2021-11-29 – 2021-11-30 (×2): 40 [IU] via SUBCUTANEOUS
  Filled 2021-11-29 (×3): qty 0.4

## 2021-11-29 MED ORDER — ONDANSETRON HCL 4 MG PO TABS: 4.0000 mg | ORAL_TABLET | Freq: Four times a day (QID) | ORAL | Status: DC | PRN

## 2021-11-29 MED ORDER — EPHEDRINE 5 MG/ML INJ
INTRAVENOUS | Status: AC
  Filled 2021-11-29: qty 5

## 2021-11-29 MED ORDER — FENTANYL CITRATE (PF) 100 MCG/2ML IJ SOLN
INTRAMUSCULAR | Status: AC
  Administered 2021-11-29: 50 ug via INTRAVENOUS
  Filled 2021-11-29: qty 2

## 2021-11-29 MED ORDER — FAMOTIDINE 20 MG PO TABS
ORAL_TABLET | ORAL | Status: AC
  Administered 2021-11-29: 20 mg via ORAL
  Filled 2021-11-29: qty 1

## 2021-11-29 MED ORDER — BUPIVACAINE-EPINEPHRINE 0.25% -1:200000 IJ SOLN
INTRAMUSCULAR | Status: DC | PRN
  Administered 2021-11-29: 30 mL

## 2021-11-29 MED ORDER — SODIUM CHLORIDE FLUSH 0.9 % IV SOLN
INTRAVENOUS | Status: AC
  Filled 2021-11-29: qty 40

## 2021-11-29 MED ORDER — EMPAGLIFLOZIN 25 MG PO TABS
25.0000 mg | ORAL_TABLET | Freq: Every day | ORAL | Status: DC
  Administered 2021-11-30 – 2021-12-01 (×2): 25 mg via ORAL
  Filled 2021-11-29 (×2): qty 1

## 2021-11-29 MED ORDER — OXYCODONE HCL 5 MG/5ML PO SOLN: 5.0000 mg | Freq: Once | ORAL | Status: DC | PRN

## 2021-11-29 MED ORDER — SODIUM CHLORIDE 0.9 % IV SOLN
INTRAVENOUS | Status: DC | PRN
  Administered 2021-11-29: 60 mL

## 2021-11-29 MED ORDER — FUROSEMIDE 40 MG PO TABS
40.0000 mg | ORAL_TABLET | Freq: Every day | ORAL | Status: DC
  Administered 2021-11-29 – 2021-12-01 (×3): 40 mg via ORAL
  Filled 2021-11-29 (×3): qty 1

## 2021-11-29 MED ORDER — BUPIVACAINE LIPOSOME 1.3 % IJ SUSP
INTRAMUSCULAR | Status: AC
  Filled 2021-11-29: qty 20

## 2021-11-29 MED ORDER — EPHEDRINE 5 MG/ML INJ
INTRAVENOUS | Status: AC
Start: 2021-11-29 — End: ?
  Filled 2021-11-29: qty 5

## 2021-11-29 MED ORDER — PHENYLEPHRINE HCL (PRESSORS) 10 MG/ML IV SOLN
INTRAVENOUS | Status: DC | PRN
  Administered 2021-11-29: 30 ug via INTRAVENOUS

## 2021-11-29 MED ORDER — AMLODIPINE BESYLATE 10 MG PO TABS
10.0000 mg | ORAL_TABLET | Freq: Every day | ORAL | Status: DC
  Administered 2021-11-30 – 2021-12-01 (×2): 10 mg via ORAL
  Filled 2021-11-29 (×2): qty 1

## 2021-11-29 MED ORDER — PHENYLEPHRINE 40 MCG/ML (10ML) SYRINGE FOR IV PUSH (FOR BLOOD PRESSURE SUPPORT)
PREFILLED_SYRINGE | INTRAVENOUS | Status: DC | PRN
  Administered 2021-11-29: 160 ug via INTRAVENOUS
  Administered 2021-11-29: 400 ug via INTRAVENOUS
  Administered 2021-11-29 (×2): 320 ug via INTRAVENOUS

## 2021-11-29 MED ORDER — DOCUSATE SODIUM 100 MG PO CAPS
100.0000 mg | ORAL_CAPSULE | Freq: Two times a day (BID) | ORAL | Status: DC
  Administered 2021-11-29 – 2021-12-01 (×4): 100 mg via ORAL
  Filled 2021-11-29 (×4): qty 1

## 2021-11-29 SURGICAL SUPPLY — 50 items
BLADE SAGITTAL AGGR TOOTH XLG (BLADE) ×2 IMPLANT
BNDG COHESIVE 6X5 TAN ST LF (GAUZE/BANDAGES/DRESSINGS) ×5 IMPLANT
CANISTER WOUND CARE 500ML ATS (WOUND CARE) ×2 IMPLANT
CHLORAPREP W/TINT 26 (MISCELLANEOUS) ×2 IMPLANT
COVER BACK TABLE REUSABLE LG (DRAPES) ×2 IMPLANT
DRAPE 3/4 80X56 (DRAPES) ×5 IMPLANT
DRAPE C-ARM XRAY 36X54 (DRAPES) ×2 IMPLANT
DRAPE INCISE IOBAN 66X60 STRL (DRAPES) IMPLANT
DRAPE POUCH INSTRU U-SHP 10X18 (DRAPES) ×2 IMPLANT
DRESSING SURGICEL FIBRLLR 1X2 (HEMOSTASIS) ×2 IMPLANT
DRSG MEPILEX SACRM 8.7X9.8 (GAUZE/BANDAGES/DRESSINGS) ×2 IMPLANT
DRSG SURGICEL FIBRILLAR 1X2 (HEMOSTASIS) ×4
ELECT BLADE 6.5 EXT (BLADE) ×2 IMPLANT
ELECT REM PT RETURN 9FT ADLT (ELECTROSURGICAL) ×2
ELECTRODE REM PT RTRN 9FT ADLT (ELECTROSURGICAL) ×1 IMPLANT
GLOVE SURG SYN 9.0  PF PI (GLOVE) ×2
GLOVE SURG SYN 9.0 PF PI (GLOVE) ×2 IMPLANT
GLOVE SURG UNDER POLY LF SZ9 (GLOVE) ×2 IMPLANT
GOWN SRG 2XL LVL 4 RGLN SLV (GOWNS) ×1 IMPLANT
GOWN STRL NON-REIN 2XL LVL4 (GOWNS) ×1
GOWN STRL REUS W/ TWL LRG LVL3 (GOWN DISPOSABLE) ×1 IMPLANT
GOWN STRL REUS W/TWL LRG LVL3 (GOWN DISPOSABLE) ×1
HEAD FEMORAL SZ28 LGE BIOLOX (Head) ×1 IMPLANT
HOLDER FOLEY CATH W/STRAP (MISCELLANEOUS) ×2 IMPLANT
KIT PREVENA INCISION MGT 13 (CANNISTER) ×2 IMPLANT
LINER DBL MOB SZ 0 52MM (Liner) ×1 IMPLANT
MANIFOLD NEPTUNE II (INSTRUMENTS) ×2 IMPLANT
MASTERLOC HIP LATERAL S6 (Hips) ×1 IMPLANT
MAT ABSORB  FLUID 56X50 GRAY (MISCELLANEOUS) ×1
MAT ABSORB FLUID 56X50 GRAY (MISCELLANEOUS) ×1 IMPLANT
NDL SPNL 20GX3.5 QUINCKE YW (NEEDLE) ×2 IMPLANT
NEEDLE SPNL 20GX3.5 QUINCKE YW (NEEDLE) ×4 IMPLANT
NS IRRIG 1000ML POUR BTL (IV SOLUTION) ×2 IMPLANT
PACK HIP COMPR (MISCELLANEOUS) ×2 IMPLANT
SCALPEL PROTECTED #10 DISP (BLADE) ×3 IMPLANT
SHELL ACETABULAR SZ 52 DM (Shell) ×1 IMPLANT
STAPLER SKIN PROX 35W (STAPLE) ×2 IMPLANT
STRAP SAFETY 5IN WIDE (MISCELLANEOUS) ×2 IMPLANT
SUT DVC 2 QUILL PDO  T11 36X36 (SUTURE) ×1
SUT DVC 2 QUILL PDO T11 36X36 (SUTURE) ×1 IMPLANT
SUT SILK 0 (SUTURE) ×1
SUT SILK 0 30XBRD TIE 6 (SUTURE) ×1 IMPLANT
SUT V-LOC 90 ABS DVC 3-0 CL (SUTURE) ×2 IMPLANT
SUT VIC AB 1 CT1 36 (SUTURE) ×2 IMPLANT
SYR 20ML LL LF (SYRINGE) ×2 IMPLANT
SYR 50ML LL SCALE MARK (SYRINGE) ×4 IMPLANT
SYR BULB IRRIG 60ML STRL (SYRINGE) ×2 IMPLANT
TOWEL OR 17X26 4PK STRL BLUE (TOWEL DISPOSABLE) IMPLANT
TRAY FOLEY MTR SLVR 16FR STAT (SET/KITS/TRAYS/PACK) ×2 IMPLANT
WAND WEREWOLF FASTSEAL 6.0 (MISCELLANEOUS) ×1 IMPLANT

## 2021-11-29 NOTE — H&P (Signed)
?Chief Complaint  ?Patient presents with  ? Right Hip - Follow-up  ?H&P- RT THA- 11/29/21  ? ? ?History of the Present Illness: ?Stacy Martinez is a 66 y.o. female here today for history and physical for right total hip arthroplasty with Dr. Hessie Knows on 11/29/2021. X-rays from 10/21/2021 show advanced right hip degenerative changes with complete loss of joint space throughout the acetabulum with subchondral cyst formation on both sides of the joint. Patient's right hip pain has been present for 2 years. She is received a lot of back treatment thinking this was her back but now she is thinking it is all coming from her hip. She complains of lateral hip, groin and thigh pain down to the knee. Her pain is increased with weightbearing activity. She has a hard time walking short distances. No history of blood clots. She is diabetic. ? ?The patient's most recent A1c was 7.4. Her blood pressure has been stable. She was just started on an aspirin a day. She has peripheral neuropathy.  ? ?I have reviewed past medical, surgical, social and family history, and allergies as documented in the EMR. ? ?Past Medical History: ?Past Medical History:  ?Diagnosis Date  ? Diabetes mellitus type 2, uncomplicated (CMS-HCC)  ? Hypertension  ? ?Past Surgical History: ?Past Surgical History:  ?Procedure Laterality Date  ? Right reverse total shoulder arthroplasty, Rt biceps tenodesis Right 08/27/2020  ?Dr. Posey Pronto  ? CESAREAN SECTION  ? HERNIA REPAIR  ? ?Past Family History: ?Family History  ?Problem Relation Age of Onset  ? Diabetes type II Mother  ? ?Medications: ?Current Outpatient Medications Ordered in Epic  ?Medication Sig Dispense Refill  ? amLODIPine (NORVASC) 10 MG tablet Take 1 tablet by mouth once daily  ? carvediloL (COREG) 25 MG tablet  ? diclofenac (VOLTAREN) 75 MG EC tablet Take 1 tablet (75 mg total) by mouth daily as needed for moderate pain.  ? empagliflozin (JARDIANCE) 25 mg tablet Take 1 tablet by mouth once daily  ?  fenofibrate 54 MG tablet Take 1 tablet by mouth once daily  ? fluconazole (DIFLUCAN) 100 MG tablet Take 1 tablet (100 mg total) by mouth once daily for 5 days 5 tablet 0  ? FUROsemide (LASIX) 40 MG tablet Take 2 tablets (80 mg total) by mouth daily.  ? gabapentin (NEURONTIN) 100 MG capsule  ? insulin GLARGINE (LANTUS SOLOSTAR) pen injector (concentration 100 units/mL) Inject 32 Units subcutaneously at bedtime  ? JANUVIA 100 mg tablet  ? LEVEMIR FLEXPEN pen injector (concentration 100 units/mL) INJECT UP TO 45 UNITS SUBCUTANEOUSLY DAILY AS DIRECTED  ? tiZANidine (ZANAFLEX) 2 MG tablet Take 1 tablet (2 mg total) by mouth every 8 (eight) hours as needed for muscle spasms (pt not certain of dosage).  ? ?No current Epic-ordered facility-administered medications on file.  ? ?Allergies: ?Allergies  ?Allergen Reactions  ? Prednisone Swelling  ? ? ?Body mass index is 44.88 kg/m?. ? ?Review of Systems: ?A comprehensive 14 point ROS was performed, reviewed, and the pertinent orthopaedic findings are documented in the HPI. ? ?Vitals:  ?11/22/21 0840  ?BP: (!) 158/80  ? ? ?General Physical Examination:  ?General:  ?Well developed, well nourished, no apparent distress, normal affect, slow antalgic gait with no assistive devices. ? ?HEENT: ?Head normocephalic, atraumatic, PERRL.  ? ?Abdomen: ?Soft, non tender, non distended, Bowel sounds present. ? ?Heart: ?Examination of the heart reveals regular, rate, and rhythm. There is no murmur noted on ascultation. There is a normal apical pulse. ? ?Lungs: ?Lungs are  clear to auscultation. There is no wheeze, rhonchi, or crackles. There is normal expansion of bilateral chest walls.  ? ?Right lower extremity: ?On exam, right hip has -10 to 30 degrees external. Palpable pulses. No swelling or edema in the lower extremities bilaterally ? ?Radiographs: ?AP pelvis and lateral x-rays of the right hip were reviewed by me today from 10/21/2021. AP pelvis shows complete joint destruction of the  right hip with large osteophytes around the femoral head and neck, large cysts in the head and acetabulum. Incidentally, there are degenerative changes to the lower lumbar spine, SI joints and mild left hip osteoarthritis. No acute process. ? ?X-ray Impression ?Severe right hip osteoarthritis. ? ?Assessment: ?ICD-10-CM  ?1. Primary osteoarthritis of right hip M16.11  ? ?Plan: ? ?57. 66 year old female with advanced right hip osteoarthritis with complete loss of joint space and limited mobility. Pain is interfering with quality of life and activities daily living. Risks, benefits, complications of a right total hip arthroplasty have been discussed with the patient. Patient has agreed and consented the procedure with Dr. Hessie Knows on 11/29/2021. ? ?Surgical Risks: ? ?The nature of the condition and the proposed procedure has been reviewed in detail with the patient. Surgical versus non-surgical options and prognosis for recovery have been reviewed and the inherent risks and benefits of each have been discussed including the risks of infection, bleeding, injury to nerves/blood vessels/tendons, incomplete relief of symptoms, persisting pain and/or stiffness, loss of function, complex regional pain syndrome, failure of the procedure, as appropriate. ? ?Electronically signed by Feliberto Gottron, [place] at 11/22/2021 8:49 AM EDT ? ?Reviewed  H+P. ?No changes noted. ? ?

## 2021-11-29 NOTE — Progress Notes (Signed)
Family notified of room # 160. ?

## 2021-11-29 NOTE — Evaluation (Signed)
Physical Therapy Evaluation ?Patient Details ?Name: Stacy Martinez ?MRN: [id number] ?DOB: 1955-12-19 ?Today's Date: 11/29/2021 ? ?History of Present Illness ? Pt is 66 y.o. female s/p R THA with anterior approach.  ?Clinical Impression ? Pt received in supine position and agreeable to therapy.  Pt with good sensation in the LE's and is able to perform exercises with good technique.  Upon sitting EOB, pt does have nausea that subsides after sitting for >1 minute.  Pt then performed STS with good technique and CGA only applied for safety with it being the first time upright.  Pt again with nausea, that subsided after standing for ~2 minutes.  Pt then transitioned to ambulation to the doorway and back.  Pt then proceeded to become nauseated again, this time vomiting slightly into cup.  Nursing notified of pain and nausea concerns and would be bringing medication for both shortly.  Pt then transitioned back to bed with no assistance necessary and left with all needs met.  Pt will benefit from skilled PT intervention to increase independence and safety with basic mobility in preparation for discharge.  Anticipation that pt will be d/c to the venue listed below.   ?   ?   ? ?Recommendations for follow up therapy are one component of a multi-disciplinary discharge planning process, led by the attending physician.  Recommendations may be updated based on patient status, additional functional criteria and insurance authorization. ? ?Follow Up Recommendations Home health PT ? ?  ?Assistance Recommended at Discharge PRN  ?Patient can return home with the following ? A little help with walking and/or transfers;A little help with bathing/dressing/bathroom ? ?  ?Equipment Recommendations Rolling walker (2 wheels);BSC/3in1  ?Recommendations for Other Services ?    ?  ?Functional Status Assessment Patient has had a recent decline in their functional status and demonstrates the ability to make significant improvements in function in a  reasonable and predictable amount of time.  ? ?  ?Precautions / Restrictions Precautions ?Precautions: Anterior Hip ?Precaution Booklet Issued: No ?Restrictions ?Weight Bearing Restrictions: Yes ?RLE Weight Bearing: Weight bearing as tolerated  ? ?  ? ?Mobility ? Bed Mobility ?Overal bed mobility: Needs Assistance ?Bed Mobility: Supine to Sit ?  ?  ?Supine to sit: Supervision ?  ?  ?General bed mobility comments: Good mobility with use of handrails. ?  ? ?Transfers ?Overall transfer level: Needs assistance ?Equipment used: Rolling walker (2 wheels) ?Transfers: Sit to/from Stand ?Sit to Stand: Min guard ?  ?  ?  ?  ?  ?General transfer comment: CGA for safety only. ?  ? ?Ambulation/Gait ?Ambulation/Gait assistance: Min guard ?Gait Distance (Feet): 24 Feet ?Assistive device: Rolling walker (2 wheels) ?Gait Pattern/deviations: Step-through pattern, Decreased step length - left, Decreased stance time - right, Decreased stride length ?Gait velocity: decreased ?  ?  ?General Gait Details: Pt with good technique with walker, verbal cuing for staying within the walker, but able to perform. ? ?Stairs ?  ?  ?  ?  ?  ? ?Wheelchair Mobility ?  ? ?Modified Rankin (Stroke Patients Only) ?  ? ?  ? ?Balance Overall balance assessment: Needs assistance ?Sitting-balance support: No upper extremity supported, Feet unsupported ?Sitting balance-Leahy Scale: Fair ?  ?  ?Standing balance support: Bilateral upper extremity supported ?Standing balance-Leahy Scale: Fair ?Standing balance comment: Pt with good technique in standing, utilizes the FW but can stand without UE support. ?  ?  ?  ?  ?  ?  ?  ?  ?  ?  ?  ?   ? ? ? ?  Pertinent Vitals/Pain Pain Assessment ?Pain Assessment: 0-10 ?Pain Score: 5  ?Pain Location: R Hip ?Pain Descriptors / Indicators: Aching ?Pain Intervention(s): Limited activity within patient's tolerance, Monitored during session, Premedicated before session, Repositioned  ? ? ?Home Living Family/patient expects to be  discharged to:: Private residence ?Living Arrangements: Children;Other relatives ?Available Help at Discharge: Family;Available 24 hours/day ?Type of Home: House ?Home Access: Stairs to enter ?Entrance Stairs-Rails: Right;Left;Can reach both ?Entrance Stairs-Number of Steps: 4 ?  ?Home Layout: One level ?Home Equipment: Grab bars - tub/shower;Hand held shower head ?Additional Comments: Pt lives with 2 sons, one of which is available 24/7, the other one works.  ?  ?Prior Function Prior Level of Function : Independent/Modified Independent ?  ?  ?  ?  ?  ?  ?  ?  ?  ? ? ?Hand Dominance  ? Dominant Hand: Right ? ?  ?Extremity/Trunk Assessment  ? Upper Extremity Assessment ?Upper Extremity Assessment: Generalized weakness ?  ? ?Lower Extremity Assessment ?Lower Extremity Assessment: RLE deficits/detail ?RLE Deficits / Details: s/p R THA, anterior approach. ?  ? ?   ?Communication  ? Communication: No difficulties  ?Cognition Arousal/Alertness: Awake/alert ?Behavior During Therapy: [facility] for tasks assessed/performed ?Overall Cognitive Status: Within Functional Limits for tasks assessed ?  ?  ?  ?  ?  ?  ?  ?  ?  ?  ?  ?  ?  ?  ?  ?  ?  ?  ?  ? ?  ?General Comments   ? ?  ?Exercises Total Joint Exercises ?Ankle Circles/Pumps: AROM, Strengthening, Both, 10 reps, Supine ?Quad Sets: AROM, Strengthening, Both, 10 reps, Supine ?Gluteal Sets: AROM, Strengthening, Both, 10 reps, Supine ?Hip ABduction/ADduction: AROM, Strengthening, Both, 10 reps, Supine ?Straight Leg Raises: AROM, Strengthening, Both, 10 reps, Supine ?Marching in Standing: AROM, Strengthening, Both, 10 reps, Standing ?Other Exercises ?Other Exercises: Pt educaed on roles of PT and services providedd during hospital stay.  Pt also ecuated on precautions of anterior THA.  ? ?Assessment/Plan  ?  ?PT Assessment Patient needs continued PT services  ?PT Problem List Decreased strength;Decreased activity tolerance;Decreased balance;Decreased mobility;Decreased knowledge  of use of DME;Decreased safety awareness;Pain ? ?   ?  ?PT Treatment Interventions DME instruction;Gait training;Stair training;Functional mobility training;Therapeutic activities;Therapeutic exercise;Balance training;Neuromuscular re-education   ? ?PT Goals (Current goals can be found in the Care Plan section)  ?Acute Rehab PT Goals ?Patient Stated Goal: to go home and get stronger. ?PT Goal Formulation: With patient ?Time For Goal Achievement: 12/13/21 ?Potential to Achieve Goals: Good ? ?  ?Frequency BID ?  ? ? ?Co-evaluation   ?  ?  ?  ?  ? ? ?  ?AM-PAC PT "6 Clicks" Mobility  ?Outcome Measure Help needed turning from your back to your side while in a flat bed without using bedrails?: A Little ?Help needed moving from lying on your back to sitting on the side of a flat bed without using bedrails?: A Little ?Help needed moving to and from a bed to a chair (including a wheelchair)?: A Little ?Help needed standing up from a chair using your arms (e.g., wheelchair or bedside chair)?: A Little ?Help needed to walk in hospital room?: A Little ?Help needed climbing 3-5 steps with a railing? : A Lot ?6 Click Score: 17 ? ?  ?End of Session Equipment Utilized During Treatment: Gait belt ?Activity Tolerance: Patient tolerated treatment well;Other (comment) (limited by nausea.) ?Patient left: in bed ?Nurse Communication: Mobility status;Patient requests pain meds ?  PT Visit Diagnosis: Unsteadiness on feet (R26.81);Other abnormalities of gait and mobility (R26.89);Muscle weakness (generalized) (M62.81);Difficulty in walking, not elsewhere classified (R26.2);Pain ?Pain - Right/Left: Right ?Pain - part of body: Hip ?  ? ?Time: 4314-2767 ?PT Time Calculation (min) (ACUTE ONLY): 54 min ? ? ?Charges:   PT Evaluation ?$PT Eval Low Complexity: 1 Low ?PT Treatments ?$Gait Training: 8-22 mins ?$Therapeutic Exercise: 8-22 mins ?  ?   ? ? ?Gwenlyn Saran, PT, DPT ?11/29/21, 5:39 PM ? ? ?Christie Nottingham ?11/29/2021, 5:34 PM ? ?

## 2021-11-29 NOTE — Op Note (Signed)
11/29/2021 ? ?2:34 PM ? ?PATIENT:  Stacy Martinez  66 y.o. female ? ?PRE-OPERATIVE DIAGNOSIS:  Primary osteoarthritis of right hip  M16.11 ? ?POST-OPERATIVE DIAGNOSIS:  Primary osteoarthritis of right hip  M16.11 ? ?PROCEDURE:  Procedure(s): ?TOTAL HIP ARTHROPLASTY ANTERIOR APPROACH (Right) ? ?SURGEON: Laurene Footman, MD ? ?ASSISTANTS: none ? ?ANESTHESIA:   spinal ? ?EBL:  Total I/O ?In: 700 [I.V.:700] ?Out: 450 [Urine:450] ? ?BLOOD ADMINISTERED:none ? ?DRAINS:  Incisional wound VAC   ? ?LOCAL MEDICATIONS USED:  MARCAINE    and OTHER Exparel ? ?SPECIMEN:  Source of Specimen:    Right femoral head ? ?DISPOSITION OF SPECIMEN:  PATHOLOGY ? ?COUNTS:  YES ? ?TOURNIQUET:  * No tourniquets in log * ? ?IMPLANTS: Medacta Masterloc 6 LAT stem with ceramic L 28 mm head, MpactDM 52 mm cup and liner ? ?DICTATION: .Dragon Dictation   The patient was brought to the operating room and after spinal anesthesia was obtained patient was placed on the operative table with the ipsilateral foot into the Medacta attachment, contralateral leg on a well-padded table. C-arm was brought in and preop template x-ray taken. After prepping and draping in usual sterile fashion appropriate patient identification and timeout procedures were completed. Anterior approach to the hip was obtained and centered over the greater trochanter and TFL muscle. The subcutaneous tissue was incised hemostasis being achieved by electrocautery.  Along with aqua Manis TFL fascia was incised and the muscle retracted laterally deep retractor placed. The lateral femoral circumflex vessels were identified and ligated. The anterior capsule was exposed and a capsulotomy performed. The neck was identified and a femoral neck cut carried out with a saw. The head was removed without difficulty and showed sclerotic femoral head and acetabulum. Reaming was carried out to 52 mm and a 52 mm cup trial gave appropriate tightness to the acetabular component a 52 DM cup was impacted into  position. The leg was then externally rotated and ischiofemoral and pubofemoral releases carried out. The femur was sequentially broached to a size 6, size 6 left stem with S head trials were placed and the final components chosen. The 6 LAT stem was inserted along with a ceramic L 28 mm head and 52 mm liner. The hip was reduced and was stable the wound was thoroughly irrigated with fibrillar placed along the posterior capsule and medial neck. The deep fascia ws closed using a heavy Quill after infiltration of 30 cc of quarter percent Sensorcaine with epinephrine.3-0 diluted with Exparel throughout the case, V-loc to close the skin with skin staples.  Incisional wound VAC applied and patient was sent to recovery in stable condition.  ? ?PLAN OF CARE: Discharge to home after PACU ? ?

## 2021-11-29 NOTE — Transfer of Care (Signed)
Immediate Anesthesia Transfer of Care Note ? ?Patient: Stacy Martinez ? ?Procedure(s) Performed: TOTAL HIP ARTHROPLASTY ANTERIOR APPROACH (Right: Hip) ? ?Patient Location: PACU ? ?Anesthesia Type:General and Spinal ? ?Level of Consciousness: drowsy ? ?Airway & Oxygen Therapy: Patient Spontanous Breathing ? ?Post-op Assessment: Report given to RN and Post -op Vital signs reviewed and stable ? ?Post vital signs: Reviewed and stable ? ?Last Vitals:  ?Vitals Value Taken Time  ?BP 106/47 11/29/21 1433  ?Temp    ?Pulse 70 11/29/21 1435  ?Resp 15 11/29/21 1435  ?SpO2 97 % 11/29/21 1435  ?Vitals shown include unvalidated device data. ? ?Last Pain:  ?Vitals:  ? 11/29/21 1035  ?PainSc: 6   ?   ? ?  ? ?Complications: No notable events documented. ?

## 2021-11-29 NOTE — Anesthesia Preprocedure Evaluation (Signed)
Anesthesia Evaluation  ?Patient identified by MRN, date of birth, ID band ?Patient awake ? ? ? ?Reviewed: ?Allergy & Precautions, NPO status , Patient's Chart, lab work & pertinent test results ? ?History of Anesthesia Complications ?Negative for: history of anesthetic complications ? ?Airway ?Mallampati: II ? ?TM Distance: >3 FB ?Neck ROM: Full ? ? ? Dental ?no notable dental hx. ?(+) Teeth Intact ?  ?Pulmonary ?neg pulmonary ROS, neg sleep apnea, neg COPD, Patient abstained from smoking.Not current smoker,  ?  ?Pulmonary exam normal ?breath sounds clear to auscultation ? ? ? ? ? ? Cardiovascular ?Exercise Tolerance: Good ?METShypertension, Pt. on medications ?(-) CAD and (-) Past MI (-) dysrhythmias  ?Rhythm:Regular Rate:Normal ?- Systolic murmurs ?TTE from 2017 with normal EF, mild LVH ?  ?Neuro/Psych ?negative neurological ROS ? negative psych ROS  ? GI/Hepatic ?neg GERD  ,(+)  ?  ? (-) substance abuse ? ,   ?Endo/Other  ?diabetes, Well Controlled, Insulin DependentMorbid obesity ? Renal/GU ?negative Renal ROS  ? ?  ?Musculoskeletal ? ? Abdominal ?  ?Peds ? Hematology ?  ?Anesthesia Other Findings ?Past Medical History: ?No date: Allergy ?No date: Arthritis ?No date: Diabetes mellitus without complication ([place]) ?No date: Enlarged heart ?No date: Hypertension ? Reproductive/Obstetrics ? ?  ? ? ? ? ? ? ? ? ? ? ? ? ? ?  ?  ? ? ? ? ? ? ? ? ?Anesthesia Physical ?Anesthesia Plan ? ?ASA: 3 ? ?Anesthesia Plan: Spinal  ? ?Post-op Pain Management: Ofirmev IV (intra-op)*  ? ?Induction: Intravenous ? ?PONV Risk Score and Plan: 2 and Ondansetron, Dexamethasone, Propofol infusion, TIVA and Midazolam ? ?Airway Management Planned: Natural Airway ? ?Additional Equipment: None ? ?Intra-op Plan:  ? ?Post-operative Plan:  ? ?Informed Consent: I have reviewed the patients History and Physical, chart, labs and discussed the procedure including the risks, benefits and alternatives for the proposed  anesthesia with the patient or authorized representative who has indicated his/her understanding and acceptance.  ? ? ? ? ? ?Plan Discussed with: CRNA and Surgeon ? ?Anesthesia Plan Comments: (Discussed R/B/A of neuraxial anesthesia technique with patient: ?- rare risks of spinal/epidural hematoma, nerve damage, infection ?- Risk of PDPH ?- Risk of nausea and vomiting ?- Risk of conversion to general anesthesia and its associated risks, including sore throat, damage to lips/eyes/teeth/oropharynx, and rare risks such as cardiac and respiratory events. ?- Risk of allergic reactions ? ?Discussed the role of CRNA in patient's perioperative care. ? ?Patient voiced understanding.)  ? ? ? ? ? ? ?Anesthesia Quick Evaluation ? ?

## 2021-11-29 NOTE — Progress Notes (Signed)
Inpatient Diabetes Program Recommendations ? ?AACE/ADA: New Consensus Statement on Inpatient Glycemic Control (2015) ? ?Target Ranges:  Prepandial:   less than 140 mg/dL ?     Peak postprandial:   less than 180 mg/dL (1-2 hours) ?     Critically ill patients:  140 - 180 mg/dL  ? ? Latest Reference Range & Units 11/29/21 10:25 11/29/21 14:50  ?Glucose-Capillary 70 - 99 mg/dL 144 (H) 158 (H)  ? ? Latest Reference Range & Units 10/08/21 15:08  ?Hemoglobin A1C 4.0 - 5.6 % 7.4 !  ? ? ?Admit with: Right total hip arthroplasty  ? ?History: DM2 ? ?Home DM Meds: Jardiance 25 mg daily ?       Levemir 45 units Daily ?       Januvia 100 mg daily ? ?Current Orders: Levemir 40 units QHS (Signed & Held) ?    Tradjenta 5 mg daily (Signed & Held) ?    Novolog 0-15 units TID AC (Signed & Held) ?    Jardiance 25 mg Daily (Signed & Held) ? ? ? ? ?Note Signed and Held orders ? ?Pt remains in PACU as of 3pm ? ?Will follow over the weekend and make insulin adjustment recommendations if needed ? ? ? ?--Will follow patient during hospitalization-- ? ?Wyn Quaker RN, MSN, CDE ?Diabetes Coordinator ?Inpatient Glycemic Control Team ?Team Pager: [phone] (8a-5p) ? ?

## 2021-11-29 NOTE — Anesthesia Procedure Notes (Signed)
Spinal ? ?Patient location during procedure: OR ?Start time: 11/29/2021 12:47 PM ?End time: 11/29/2021 12:52 PM ?Reason for block: surgical anesthesia ?Staffing ?Performed: resident/CRNA  ?Resident/CRNA: Lia Foyer, CRNA ?Preanesthetic Checklist ?Completed: patient identified, IV checked, site marked, risks and benefits discussed, surgical consent, monitors and equipment checked, pre-op evaluation and timeout performed ?Spinal Block ?Patient position: sitting ?Prep: ChloraPrep ?Patient monitoring: heart rate, cardiac monitor, continuous pulse ox and blood pressure ?Approach: midline ?Location: L3-4 ?Injection technique: single-shot ?Needle ?Needle type: Sprotte and Pencan  ?Needle gauge: 25 G ?Needle length: 9 cm ?Assessment ?Sensory level: T4 ?Events: CSF return ? ? ? ?

## 2021-11-30 DIAGNOSIS — M1611 Unilateral primary osteoarthritis, right hip: Secondary | ICD-10-CM | POA: Diagnosis not present

## 2021-11-30 LAB — BASIC METABOLIC PANEL
Anion gap: 8 (ref 5–15)
BUN: 14 mg/dL (ref 8–23)
CO2: 25 mmol/L (ref 22–32)
Calcium: 8.4 mg/dL — ABNORMAL LOW (ref 8.9–10.3)
Chloride: 105 mmol/L (ref 98–111)
Creatinine, Ser: 0.75 mg/dL (ref 0.44–1.00)
GFR, Estimated: >60 mL/min (ref >60)
Glucose, Bld: 119 mg/dL — ABNORMAL HIGH (ref 70–99)
Potassium: 4.6 mmol/L (ref 3.5–5.1)
Sodium: 138 mmol/L (ref 135–145)

## 2021-11-30 LAB — CBC
HCT: 33.7 % — ABNORMAL LOW (ref 36.0–46.0)
Hemoglobin: 10.4 g/dL — ABNORMAL LOW (ref 12.0–15.0)
MCH: 24.9 pg — ABNORMAL LOW (ref 26.0–34.0)
MCHC: 30.9 g/dL (ref 30.0–36.0)
MCV: 80.8 fL (ref 80.0–100.0)
Platelets: 376 10*3/uL (ref 150–400)
RBC: 4.17 MIL/uL (ref 3.87–5.11)
RDW: 15.3 % (ref 11.5–15.5)
WBC: 11.9 10*3/uL — ABNORMAL HIGH (ref 4.0–10.5)
nRBC: 0 % (ref 0.0–0.2)

## 2021-11-30 LAB — GLUCOSE, CAPILLARY
Glucose-Capillary: 108 mg/dL — ABNORMAL HIGH (ref 70–99)
Glucose-Capillary: 141 mg/dL — ABNORMAL HIGH (ref 70–99)
Glucose-Capillary: 133 mg/dL — ABNORMAL HIGH (ref 70–99)
Glucose-Capillary: 101 mg/dL — ABNORMAL HIGH (ref 70–99)

## 2021-11-30 NOTE — Progress Notes (Signed)
OT Cancellation Note ? ?Patient Details ?Name: Stacy Martinez ?MRN: [id number] ?DOB: [birth year] ? ? ?Cancelled Treatment:    Reason Eval/Treat Not Completed: Pain limiting ability to participate Pt report when attempting to get up with PT this am had 10/10 shooting pain in hip - could not do anything and nauseas - pt very droopy after getting medication. Attempt later today with PT's 2nd session to complete eval  ? ?Ariz Terrones OTR/L,CLT ?11/30/2021, 11:29 AM ?

## 2021-11-30 NOTE — Anesthesia Post-op Follow-up Note (Signed)
?  Anesthesia Pain Follow-up Note ? ?Patient: Stacy Martinez ? ?Day #: 1 ? ?Date of Follow-up: 11/30/2021 Time: 2:34 PM ? ?Last Vitals:  ?Vitals:  ? 11/30/21 0820 11/30/21 1203  ?BP: (!) 128/54 135/61  ?Pulse: 83 77  ?Resp: 16 16  ?Temp: 37 ?C 36.8 ?C  ?SpO2: 94% 96%  ? ? ?Level of Consciousness: alert ? ?Pain: mild  ? ?Side Effects:None ? ? ? ?Anti-Coag Meds (From admission, onward)  ? Start     Dose/Rate Route Frequency Ordered Stop  ? 11/30/21 0800  enoxaparin (LOVENOX) injection 40 mg       ? 40 mg Subcutaneous Every 24 hours 11/29/21 1631    ?  ? ? ?[place] Ouch ? ? ? ?

## 2021-11-30 NOTE — Anesthesia Postprocedure Evaluation (Signed)
Anesthesia Post Note ? ?Patient: Stacy Martinez ? ?Procedure(s) Performed: TOTAL HIP ARTHROPLASTY ANTERIOR APPROACH (Right: Hip) ? ?Patient location during evaluation: PACU ?Anesthesia Type: Spinal ?Level of consciousness: awake and alert ?Pain management: pain level controlled ?Vital Signs Assessment: post-procedure vital signs reviewed and stable ?Respiratory status: spontaneous breathing, nonlabored ventilation and respiratory function stable ?Cardiovascular status: blood pressure returned to baseline and stable ?Postop Assessment: no apparent nausea or vomiting ?Anesthetic complications: no ? ? ?No notable events documented. ? ? ?Last Vitals:  ?Vitals:  ? 11/30/21 0820 11/30/21 1203  ?BP: (!) 128/54 135/61  ?Pulse: 83 77  ?Resp: 16 16  ?Temp: 37 ?C 36.8 ?C  ?SpO2: 94% 96%  ?  ?Last Pain:  ?Vitals:  ? 11/30/21 1317  ?PainSc: 0-No pain  ? ? ?  ?  ?  ?  ?  ?  ? ?[place] Ouch ? ? ? ? ?

## 2021-11-30 NOTE — Progress Notes (Addendum)
Physical Therapy Treatment ?Patient Details ?Name: Stacy Martinez ?MRN: [id number] ?DOB: Dec 22, 1955 ?Today's Date: 11/30/2021 ? ? ?History of Present Illness Pt is 66 y.o. female s/p R THA with anterior approach. ? ?  ?PT Comments  ? ? Patient tolerated PM session better than this AM. Pain and nausea continue to be limiting for patient's participation during sessions. Pain was 6/10 in R hip during session. Patient continues to complete bed mobility at supervision. Sit to stand from EOB and toilet were completed at SBA/SUP with cueing on hand placement. Despite requiring multiple standing rest breaks due to pain and nausea, patient was able to ambulate from bed>toliet> into hallway> back to recliner. Patient was left in recliner with all needs met and family present. Review HEP packet and stairs with patient next session. Patient would continue to benefit from skilled physical therapy in order to optimize patient's return to PLOF. Continue to recommend HHPT upon discharge from acute hospitalization.  ?  ?Recommendations for follow up therapy are one component of a multi-disciplinary discharge planning process, led by the attending physician.  Recommendations may be updated based on patient status, additional functional criteria and insurance authorization. ? ?Follow Up Recommendations ? Home health PT ?  ?  ?Assistance Recommended at Discharge PRN  ?Patient can return home with the following A little help with walking and/or transfers;A little help with bathing/dressing/bathroom ?  ?Equipment Recommendations ? Rolling walker (2 wheels);BSC/3in1  ?  ?Recommendations for Other Services   ? ? ?  ?Precautions / Restrictions Precautions ?Precautions: Anterior Hip ?Precaution Booklet Issued: No ?Restrictions ?Weight Bearing Restrictions: Yes ?RLE Weight Bearing: Weight bearing as tolerated  ?  ? ?Mobility ? Bed Mobility ?Overal bed mobility: Needs Assistance ?Bed Mobility: Supine to Sit ?  ?  ?Supine to sit: Supervision ?Sit to  supine: Supervision ?  ?General bed mobility comments: Good mobility with use of handrails, ended session in recliner ?Patient Response: Anxious ? ?Transfers ?Overall transfer level: Needs assistance ?Equipment used: Rolling walker (2 wheels) ?Transfers: Sit to/from Stand ?Sit to Stand: Supervision ?  ?  ?  ?  ?  ?General transfer comment: SBA for safety ?  ? ?Ambulation/Gait ?Ambulation/Gait assistance: Min guard (SBA) ?Gait Distance (Feet): 70 Feet ?Assistive device: Rolling walker (2 wheels) ?Gait Pattern/deviations: Step-through pattern, Decreased step length - left, Decreased stance time - right, Decreased stride length ?Gait velocity: decreased ?  ?  ?General Gait Details: multiple standing recover breaks due to pain and nausea where patient would lean on RW at [facility] ? ? ?Stairs ?  ?  ?  ?  ?  ? ? ?Wheelchair Mobility ?  ? ?Modified Rankin (Stroke Patients Only) ?  ? ? ?  ?Balance Overall balance assessment: Needs assistance ?Sitting-balance support: No upper extremity supported, Feet unsupported ?Sitting balance-Leahy Scale: Fair ?  ?  ?Standing balance support: Bilateral upper extremity supported ?Standing balance-Leahy Scale: Fair ?Standing balance comment: Pt with good technique in standing, utilizes the FW ?  ?  ?  ?  ?  ?  ?  ?  ?  ?  ?  ?  ? ?  ?Cognition Arousal/Alertness: Awake/alert ?Behavior During Therapy: [facility] for tasks assessed/performed ?Overall Cognitive Status: Within Functional Limits for tasks assessed ?  ?  ?  ?  ?  ?  ?  ?  ?  ?  ?  ?  ?  ?  ?  ?  ?  ?  ?  ? ?  ?Exercises Other Exercises ?Other Exercises:  lateral weight shifting to increas WBing onto the RLE, patient only able to tolerate 2x~20 seconds due to significant pain ? ?  ?General Comments General comments (skin integrity, edema, etc.): SpO2 94%, HR: 86 ?  ?  ? ?Pertinent Vitals/Pain Pain Assessment ?Pain Assessment: 0-10 ?Pain Score: 6  ?Pain Location: R Hip ?Pain Descriptors / Indicators: Aching, Burning, Sharp, Grimacing ?Pain  Intervention(s): Monitored during session, Limited activity within patient's tolerance  ? ? ?Home Living   ?  ?  ?  ?  ?  ?  ?  ?  ?  ?   ?  ?Prior Function    ?  ?  ?   ? ?PT Goals (current goals can now be found in the care plan section) Acute Rehab PT Goals ?Patient Stated Goal: to go home and get stronger. ?PT Goal Formulation: With patient ?Time For Goal Achievement: 12/13/21 ?Potential to Achieve Goals: Good ?Progress towards PT goals: Progressing toward goals ? ?  ?Frequency ? ? ? BID ? ? ? ?  ?PT Plan Current plan remains appropriate  ? ? ?Co-evaluation PT/OT/SLP Co-Evaluation/Treatment: Yes ?Reason for Co-Treatment: For patient/therapist safety;To address functional/ADL transfers ?PT goals addressed during session: Mobility/safety with mobility;Balance;Proper use of DME;Strengthening/ROM ?OT goals addressed during session: ADL's and self-care ?  ? ?  ?AM-PAC PT "6 Clicks" Mobility   ?Outcome Measure ? Help needed turning from your back to your side while in a flat bed without using bedrails?: A Little ?Help needed moving from lying on your back to sitting on the side of a flat bed without using bedrails?: A Little ?Help needed moving to and from a bed to a chair (including a wheelchair)?: A Little ?Help needed standing up from a chair using your arms (e.g., wheelchair or bedside chair)?: A Little ?Help needed to walk in hospital room?: A Little ?Help needed climbing 3-5 steps with a railing? : A Lot ?6 Click Score: 17 ? ?  ?End of Session Equipment Utilized During Treatment: Gait belt ?Activity Tolerance: Patient tolerated treatment well;Patient limited by pain;Other (comment) ?Patient left: in chair;with call bell/phone within reach;with chair alarm set;with family/visitor present ?Nurse Communication: Mobility status ?PT Visit Diagnosis: Unsteadiness on feet (R26.81);Other abnormalities of gait and mobility (R26.89);Muscle weakness (generalized) (M62.81);Difficulty in walking, not elsewhere classified  (R26.2);Pain ?Pain - Right/Left: Right ?Pain - part of body: Hip ?  ? ? ?Time: 6060-0459 ?PT Time Calculation (min) (ACUTE ONLY): 24 min ? ?Charges:  $Gait Training: 23-37 mins         ?          ? ?Avielle Imbert, PT  ?11/30/21. 1:37 PM ? ? ?

## 2021-11-30 NOTE — Progress Notes (Signed)
Physical Therapy Treatment ?Patient Details ?Name: Stacy Martinez ?MRN: [id number] ?DOB: [birth year] ?Today's Date: 11/30/2021 ? ? ?History of Present Illness Pt is 66 y.o. female s/p R THA with anterior approach. ? ?  ?PT Comments  ? ? Patient tolerated AM session poorly due to pain. At rest patient reported only 6/10 pain, however with functional activity pain increased to 10/10 sharp shooting pain in the R hip. RN notified. Patient was able to complete all bed mobility and sit to stand transfers at SBA/SUP with use of hand rails for support.  Patient was only able to tolerate around 1 minute of static standing EOB before requesting to sit due to pain and nausea. After around 1 minute sitting EOB patient attempting standing again. Was able to take 2 steps forward/back at [facility] with RW and tolerated minimal lateral weight-shifting. Patient was left in bed with all needs met. Patient would continue to benefit from skilled physical therapy in order to optimize patient's return to PLOF. Continue to recommend HHPT.  ?  ?Recommendations for follow up therapy are one component of a multi-disciplinary discharge planning process, led by the attending physician.  Recommendations may be updated based on patient status, additional functional criteria and insurance authorization. ? ?Follow Up Recommendations ? Home health PT ?  ?  ?Assistance Recommended at Discharge PRN  ?Patient can return home with the following A little help with walking and/or transfers;A little help with bathing/dressing/bathroom ?  ?Equipment Recommendations ? Rolling walker (2 wheels);BSC/3in1  ?  ?Recommendations for Other Services   ? ? ?  ?Precautions / Restrictions Precautions ?Precautions: Anterior Hip ?Precaution Booklet Issued: No ?Restrictions ?Weight Bearing Restrictions: Yes ?RLE Weight Bearing: Weight bearing as tolerated  ?  ? ?Mobility ? Bed Mobility ?Overal bed mobility: Needs Assistance ?Bed Mobility: Supine to Sit, Sit to Supine ?  ?  ?Supine to  sit: Supervision ?Sit to supine: Supervision ?  ?General bed mobility comments: Good mobility with use of handrails. ?Patient Response: Anxious ? ?Transfers ?Overall transfer level: Needs assistance ?Equipment used: Rolling walker (2 wheels) ?Transfers: Sit to/from Stand ?Sit to Stand: Supervision (SBA) ?  ?  ?  ?  ?  ?General transfer comment: SBA for safety ?  ? ?Ambulation/Gait ?Ambulation/Gait assistance: Min guard ?Gait Distance (Feet): 4 Feet ?Assistive device: Rolling walker (2 wheels) ?Gait Pattern/deviations: Step-through pattern, Decreased step length - left, Decreased stance time - right, Decreased stride length ?Gait velocity: decreased ?  ?  ?General Gait Details: due to pain patient was only able to tolerate 2 steps forward from EOB/2 steps backwards before reports of significant pain and nausea ? ? ?Stairs ?  ?  ?  ?  ?  ? ? ?Wheelchair Mobility ?  ? ?Modified Rankin (Stroke Patients Only) ?  ? ? ?  ?Balance Overall balance assessment: Needs assistance ?Sitting-balance support: No upper extremity supported, Feet unsupported ?Sitting balance-Leahy Scale: Fair ?  ?  ?Standing balance support: Bilateral upper extremity supported ?Standing balance-Leahy Scale: Fair ?Standing balance comment: Pt with good technique in standing, utilizes the FW ?  ?  ?  ?  ?  ?  ?  ?  ?  ?  ?  ?  ? ?  ?Cognition Arousal/Alertness: Awake/alert ?Behavior During Therapy: [facility] for tasks assessed/performed ?Overall Cognitive Status: Within Functional Limits for tasks assessed ?  ?  ?  ?  ?  ?  ?  ?  ?  ?  ?  ?  ?  ?  ?  ?  ?  ?  ?  ? ?  ?  Exercises Other Exercises ?Other Exercises: lateral weight shifting to increas WBing onto the RLE, patient only able to tolerate 2x~20 seconds due to significant pain ? ?  ?General Comments General comments (skin integrity, edema, etc.): SpO2 94%, HR: 86 ?  ?  ? ?Pertinent Vitals/Pain Pain Assessment ?Pain Assessment: 0-10 ?Pain Score: 10-Worst pain ever ?Pain Location: R Hip ?Pain Descriptors /  Indicators: Aching, Burning, Sharp, Grimacing ?Pain Intervention(s): Limited activity within patient's tolerance, Monitored during session, Repositioned, Patient requesting pain meds-RN notified  ? ? ?Home Living   ?  ?  ?  ?  ?  ?  ?  ?  ?  ?   ?  ?Prior Function    ?  ?  ?   ? ?PT Goals (current goals can now be found in the care plan section) Acute Rehab PT Goals ?Patient Stated Goal: to go home and get stronger. ?PT Goal Formulation: With patient ?Time For Goal Achievement: 12/13/21 ?Potential to Achieve Goals: Good ?Progress towards PT goals: Progressing toward goals ? ?  ?Frequency ? ? ? BID ? ? ? ?  ?PT Plan    ? ? ?Co-evaluation   ?  ?  ?  ?  ? ?  ?AM-PAC PT "6 Clicks" Mobility   ?Outcome Measure ? Help needed turning from your back to your side while in a flat bed without using bedrails?: A Little ?Help needed moving from lying on your back to sitting on the side of a flat bed without using bedrails?: A Little ?Help needed moving to and from a bed to a chair (including a wheelchair)?: A Little ?Help needed standing up from a chair using your arms (e.g., wheelchair or bedside chair)?: A Little ?Help needed to walk in hospital room?: A Little ?Help needed climbing 3-5 steps with a railing? : A Lot ?6 Click Score: 17 ? ?  ?End of Session Equipment Utilized During Treatment: Gait belt ?Activity Tolerance: Patient tolerated treatment well;Patient limited by pain;Other (comment) (limited by nausea) ?Patient left: in bed;with call bell/phone within reach;with bed alarm set ?Nurse Communication: Mobility status;Patient requests pain meds ?PT Visit Diagnosis: Unsteadiness on feet (R26.81);Other abnormalities of gait and mobility (R26.89);Muscle weakness (generalized) (M62.81);Difficulty in walking, not elsewhere classified (R26.2);Pain ?Pain - Right/Left: Right ?Pain - part of body: Hip ?  ? ? ?Time: 6962-9528 ?PT Time Calculation (min) (ACUTE ONLY): 18 min ? ?Charges:  $Therapeutic Exercise: 8-22 mins           ?          ? ?Reisa Coppola, PT  ?11/30/21. 10:06 AM ? ? ?

## 2021-11-30 NOTE — Progress Notes (Signed)
Subjective: ?1 Day Post-Op Procedure(s) (LRB): ?TOTAL HIP ARTHROPLASTY ANTERIOR APPROACH (Right) ?Patient reports pain as 8 on 0-10 scale.   ?Patient is  overall well but is reporting significant pain in the right hip with therapy. ?Plan is to go Home after hospital stay.  PT and Care management to assist with discharge planning. ?Negative for chest pain and shortness of breath ?Fever: no ?Gastrointestinal:Patient does report nausea this morning. ? ?Objective: ?Vital signs in last 24 hours: ?Temp:  [97.1 ?F (36.2 ?C)-98.7 ?F (37.1 ?C)] 98.3 ?F (36.8 ?C) (04/01 1203) ?Pulse Rate:  [58-83] 77 (04/01 1203) ?Resp:  [10-41] 16 (04/01 1203) ?BP: (101-143)/(47-74) 135/61 (04/01 1203) ?SpO2:  [90 %-99 %] 96 % (04/01 1203) ?Weight:  [108.9 kg] 108.9 kg (03/31 1821) ? ?Intake/Output from previous day: ? ?Intake/Output Summary (Last 24 hours) at 11/30/2021 1312 ?Last data filed at 11/30/2021 0604 ?Gross per 24 hour  ?Intake 2222.37 ml  ?Output 3250 ml  ?Net -1027.63 ml  ?  ?Intake/Output this shift: ?No intake/output data recorded. ? ?Labs: ?Recent Labs  ?  11/29/21 ?1646 11/30/21 ?0615  ?HGB 10.7* 10.4*  ? ?Recent Labs  ?  11/29/21 ?1646 11/30/21 ?0615  ?WBC 15.5* 11.9*  ?RBC 4.22 4.17  ?HCT 34.2* 33.7*  ?PLT 406* 376  ? ?Recent Labs  ?  11/29/21 ?1646 11/30/21 ?0615  ?NA  --  138  ?K  --  4.6  ?CL  --  105  ?CO2  --  25  ?BUN  --  14  ?CREATININE 0.78 0.75  ?GLUCOSE  --  119*  ?CALCIUM  --  8.4*  ? ?No results for input(s): LABPT, INR in the last 72 hours. ? ? ?EXAM ?General - Patient is Alert, Appropriate, and Oriented ?Extremity - ABD soft ?Neurovascular intact ?Dorsiflexion/Plantar flexion intact ?No cellulitis present ?Dressing/Incision - Prevena intact to the right hip without any drainage present. ?Motor Function - intact, moving foot and toes well on exam.  ?Abdomen soft with normal bowel sounds. ? ?Past Medical History:  ?Diagnosis Date  ? Allergy   ? Arthritis   ? Diabetes mellitus without complication ([place])   ?  Enlarged heart   ? Hypertension   ? ? ?Assessment/Plan: ?1 Day Post-Op Procedure(s) (LRB): ?TOTAL HIP ARTHROPLASTY ANTERIOR APPROACH (Right) ?Principal Problem: ?  Status post total hip replacement, right ? ?Estimated body mass index is 43.9 kg/m? as calculated from the following: ?  Height as of this encounter: '5\' 2"'$  (1.575 m). ?  Weight as of this encounter: 108.9 kg. ?Advance diet ?Up with therapy ?D/C IV fluids  when tolerating po intake. ? ?Labs reviewed this AM, Hg 10.4, WBC 11.9.  Repeat CBC tomorrow. ?Patient has zofran ordered as needed for nausea.  Continue with oxycodone for pain management at this time. ?Up with therapy as much as possible today and tomorrow, current plan is for d/c home but may need SNF if she is not able to progress. ? ? ?DVT Prophylaxis - Lovenox, TED hose, and SCDs ?Weight-Bearing as tolerated to right leg ? ?Raquel Maurilio Puryear, PA-C ?[facility] ?11/30/2021, 1:12 PM  ?

## 2021-11-30 NOTE — Evaluation (Signed)
Occupational Therapy Evaluation ?Patient Details ?Name: Stacy Martinez ?MRN: [id number] ?DOB: [birth year] ?Today's Date: 11/30/2021 ? ? ?History of Present Illness Pt is 66 y.o. female s/p R THA with anterior approach.  ? ?Clinical Impression ?  ?Patient secondary postop right total hip replacement with anterior approach by Dr. Rudene Christians.  Attempted earlier this morning but patient had a 10/10 pain during weightbearing with PT in right hip.  Patient did had pain medication.  Reports she is very sleepy.  Upon entering this afternoon patient report pain 0/10 in bed during session 6/10 in right hip.  Upon sitting up and standing had some dizziness with nausea.  But was able to participate with several rest breaks.  Patient limited mostly in lower body dressing and bathing, adaptive equipment education was done and info provided where to get a hip or knee kit.  Patient would need more review of use of adaptive equipment.  Functional mobility to bathroom patient needs min verbal cues for weightbearing to decrease pain in right hip as well as hand placement during transfers.  Patient did not show any loss of balance during functional mobility to the bathroom using rolling walker.  Patient can benefit from continued OT services while in the setting to increase independence in functional mobility to bathroom as well as lower body dressing using adaptive equipment. ?   ? ?Recommendations for follow up therapy are one component of a multi-disciplinary discharge planning process, led by the attending physician.  Recommendations may be updated based on patient status, additional functional criteria and insurance authorization.  ? ?Follow Up Recommendations   HHOT and PT ?  ?Assistance Recommended at Discharge   [place] will be home to assist  ?Patient can return home with the following 24-hour supervision ? ?  ?Functional Status Assessment ?    ?Equipment Recommendations ? Tub shower transfer bench ?  ?Recommendations for Other Services  PT ? ? ?  ?Precautions / Restrictions Precautions ?Precautions: Anterior Hip ?Precaution Booklet Issued: No ?Restrictions ?Weight Bearing Restrictions: Yes ?RLE Weight Bearing: Weight bearing as tolerated  ? ?  ? ?Mobility Bed Mobility ?  ?Bed Mobility: Supine to Sit ?  ?  ?Supine to sit: Supervision ?  ?  ?  ?  ? ?Transfers ?  ?Equipment used: Rolling walker (2 wheels) ?Transfers: Sit to/from Stand ?Sit to Stand: Supervision ?  ?  ?  ?  ?  ?General transfer comment: SBA for safety to bathroom and using bedside commode ?  ? ?  ?Balance   ?Sitting-balance support: No upper extremity supported ?Sitting balance-Leahy Scale: Good ?  ?  ?Standing balance support: Bilateral upper extremity supported ?Standing balance-Leahy Scale: Fair ?Standing balance comment: Limited by pain in the right incision, nausea ?  ?  ?  ?  ?  ?  ?  ?  ?  ?  ?  ?   ? ?ADL either performed or assessed with clinical judgement  ? ?ADL   ?  ?  ?  ?  ?  ?  ?  ?  ?  ?  ?  ?  ?  ?  ?  ?  ?  ?  ?  ?General ADL Comments: Independent in grooming and eating.  Upper body ADLs standby assist for set up.  Lower body dressing mod assist to max assist for right lower extremity.  Toileting sitting independent standing standby assist for clothing management  ? ? ? ?Vision Patient Visual Report: No change from baseline ?   ?   ?  Perception   ?  ?Praxis   ?  ? ?Pertinent Vitals/Pain Pain Assessment ?Pain Score: 6  ?Pain Location: R Hip ?Pain Descriptors / Indicators: Aching, Stabbing ?Pain Intervention(s): Monitored during session  ? ? ? ?Hand Dominance   ?  ?Extremity/Trunk Assessment Upper Extremity Assessment ?Upper Extremity Assessment: Overall WFL for tasks assessed ?  ?Lower Extremity Assessment ?RLE Deficits / Details: s/p R THA, anterior approach. ?  ?  ?  ?Communication Communication ?Communication: No difficulties ?  ?Cognition Arousal/Alertness: Awake/alert ?Behavior During Therapy: [facility] for tasks assessed/performed ?Overall Cognitive Status: Within  Functional Limits for tasks assessed ?  ?  ?  ?  ?  ?  ?  ?  ?  ?  ?  ?  ?  ?  ?  ?  ?  ?  ?  ?General Comments  SpO2 94%, HR: 86 ? ?  ?Exercises Other Exercises ?Other Exercises: Patient was educated in use of adaptive equipment for lower body dressing-using a reacher and sock aid for mostly right lower extremity-information provided for getting adaptive equipment kit for home use ?Other Exercises: Reviewed with patient's precautions during IADLs and functional mobility at home with anterior hip precautions ?  ?Shoulder Instructions    ? ? ?Home Living Family/patient expects to be discharged to:: Private residence ?Living Arrangements: Children;Other relatives ?Available Help at Discharge: Family;Available 24 hours/day ?Type of Home: House ?Home Access: Stairs to enter ?  ?Entrance Stairs-Rails: Right;Left;Can reach both ?Home Layout: One level ?  ?  ?Bathroom Shower/Tub: Tub/shower unit ?  ?Bathroom Toilet: Handicapped height ?  ?  ?  ?  ?Additional Comments: Pt lives with 2 sons, one of which is available 24/7, the other one works. ?  ? ?  ?Prior Functioning/Environment Prior Level of Function : Independent/Modified Independent ?  ?  ?  ?  ?  ?  ?  ?  ?  ? ?  ?  ?OT Problem List: Decreased strength;Decreased knowledge of use of DME or AE;Decreased knowledge of precautions;Impaired balance (sitting and/or standing);Decreased activity tolerance;Pain;Decreased safety awareness ?  ?   ?OT Treatment/Interventions: Self-care/ADL training;Patient/family education;Balance training;DME and/or AE instruction  ?  ?OT Goals(Current goals can be found in the care plan section) Acute Rehab OT Goals ?Patient Stated Goal: Get my pain better my hip and able to do things myself and go home ?OT Goal Formulation: With patient ?Time For Goal Achievement: 12/07/21 ?Potential to Achieve Goals: Good  ?OT Frequency: Min 2X/week ?  ? ?Co-evaluation   ?Reason for Co-Treatment: To address functional/ADL transfers;For patient/therapist  safety (pain meds - pain this ame 10/10 and was sleepy) ?PT goals addressed during session:  (bathroom transfer- safety) ?OT goals addressed during session: ADL's and self-care;Proper use of Adaptive equipment and DME ?  ? ?  ?AM-PAC OT "6 Clicks" Daily Activity     ?Outcome Measure Help from another person eating meals?: None ?Help from another person taking care of personal grooming?: None ?Help from another person toileting, which includes using toliet, bedpan, or urinal?: A Little ?Help from another person bathing (including washing, rinsing, drying)?: A Little ?Help from another person to put on and taking off regular upper body clothing?: A Little ?Help from another person to put on and taking off regular lower body clothing?: A Little ?6 Click Score: 20 ?  ?End of Session Equipment Utilized During Treatment: Gait belt;Rolling walker (2 wheels) ? ?Activity Tolerance: Patient tolerated treatment well ?Patient left: in chair;with chair alarm set ? ?OT Visit Diagnosis:  Unsteadiness on feet (R26.81);Pain ?Pain - Right/Left: Right ?Pain - part of body: Hip  ?              ?Time: 5953-9672 ?OT Time Calculation (min): 28 min ?Charges:  OT General Charges ?$OT Visit: 1 Visit ?OT Treatments ?$Self Care/Home Management : 8-22 mins ? ? ?Rosalyn Gess OTR/L,CLT ?11/30/2021, 1:53 PM ?

## 2021-11-30 NOTE — Plan of Care (Signed)
m °

## 2021-12-01 DIAGNOSIS — M1611 Unilateral primary osteoarthritis, right hip: Secondary | ICD-10-CM | POA: Diagnosis not present

## 2021-12-01 LAB — CBC
HCT: 30.5 % — ABNORMAL LOW (ref 36.0–46.0)
Hemoglobin: 9.6 g/dL — ABNORMAL LOW (ref 12.0–15.0)
MCH: 25.5 pg — ABNORMAL LOW (ref 26.0–34.0)
MCHC: 31.5 g/dL (ref 30.0–36.0)
MCV: 80.9 fL (ref 80.0–100.0)
Platelets: 352 10*3/uL (ref 150–400)
RBC: 3.77 MIL/uL — ABNORMAL LOW (ref 3.87–5.11)
RDW: 15.4 % (ref 11.5–15.5)
WBC: 10.8 10*3/uL — ABNORMAL HIGH (ref 4.0–10.5)
nRBC: 0 % (ref 0.0–0.2)

## 2021-12-01 LAB — GLUCOSE, CAPILLARY
Glucose-Capillary: 94 mg/dL (ref 70–99)
Glucose-Capillary: 130 mg/dL — ABNORMAL HIGH (ref 70–99)

## 2021-12-01 MED ORDER — ACETAMINOPHEN 500 MG PO TABS: 500.0000 mg | ORAL_TABLET | Freq: Four times a day (QID) | ORAL | 0 refills | Status: DC | PRN

## 2021-12-01 MED ORDER — ONDANSETRON HCL 4 MG PO TABS: 4.0000 mg | ORAL_TABLET | Freq: Four times a day (QID) | ORAL | 0 refills | Status: AC | PRN

## 2021-12-01 MED ORDER — LACTULOSE 10 GM/15ML PO SOLN: 10.0000 g | Freq: Two times a day (BID) | ORAL | Status: DC | PRN

## 2021-12-01 MED ORDER — OXYCODONE HCL 5 MG PO TABS: 5.0000 mg | ORAL_TABLET | ORAL | 0 refills | Status: DC | PRN

## 2021-12-01 MED ORDER — ENOXAPARIN SODIUM 40 MG/0.4ML IJ SOSY: 40.0000 mg | PREFILLED_SYRINGE | INTRAMUSCULAR | 0 refills | Status: DC

## 2021-12-01 MED ORDER — TRAMADOL HCL 50 MG PO TABS: 50.0000 mg | ORAL_TABLET | Freq: Four times a day (QID) | ORAL | 0 refills | Status: DC

## 2021-12-01 MED ORDER — TIZANIDINE HCL 4 MG PO TABS: 4.0000 mg | ORAL_TABLET | Freq: Three times a day (TID) | ORAL | 0 refills | Status: DC | PRN

## 2021-12-01 NOTE — TOC Progression Note (Signed)
Transition of Care (TOC) - Progression Note  ? ? ?Patient Details  ?Name: Stacy Martinez ?MRN: [id number] ?Date of Birth: 25-Jun-1956 ? ?Transition of Care (TOC) CM/SW Contact  ?Aiven Kampe, LCSW ?Phone Number: [phone] ?12/01/2021, 2:45 PM ? ?Clinical Narrative:    ? ?DME orders changed, CSW updated [employer], and DME will delivered to room ASAP.  Unit LPN and Attending updated. ? ?  ?  ? ?Expected Discharge Plan and Services ?  ?  ?  ?  ?  ?Expected Discharge Date: 12/01/21               ?  ?  ?  ?  ?  ?  ?  ?  ?  ?  ? ? ?Social Determinants of Health (SDOH) Interventions ?  ? ?Readmission Risk Interventions ?   ? View : No data to display.  ?  ?  ?  ? ? ?

## 2021-12-01 NOTE — Progress Notes (Signed)
Physical Therapy Treatment ?Patient Details ?Name: Stacy Martinez ?MRN: [id number] ?DOB: Oct 31, 1955 ?Today's Date: 12/01/2021 ? ? ?History of Present Illness Pt is 66 y.o. female s/p R THA with anterior approach. ? ?  ?PT Comments  ? ? OOB, to bathroom to void, then to gym for stair training with min guard. ?Gait distances limited today to 20' - R shoulder discomfort noted from prior TSR.  She is confident of her ability to manage short household distances and has support at [employer].  Has walked up to 66' yesterday but wanted to conserve her energy and walking for discharge home after lunch.  Discussed home safety. ?  ?Recommendations for follow up therapy are one component of a multi-disciplinary discharge planning process, led by the attending physician.  Recommendations may be updated based on patient status, additional functional criteria and insurance authorization. ? ?Follow Up Recommendations ? Home health PT ?  ?  ?Assistance Recommended at Discharge PRN  ?Patient can return home with the following A little help with walking and/or transfers;A little help with bathing/dressing/bathroom ?  ?Equipment Recommendations ? Rolling walker (2 wheels);BSC/3in1  ?  ?Recommendations for Other Services   ? ? ?  ?Precautions / Restrictions Precautions ?Precautions: Anterior Hip ?Precaution Booklet Issued: No ?Restrictions ?Weight Bearing Restrictions: Yes ?RLE Weight Bearing: Weight bearing as tolerated ?RLE Partial Weight Bearing Percentage or Pounds:  (N/A)  ?  ? ?Mobility ? Bed Mobility ?Overal bed mobility: Needs Assistance ?Bed Mobility: Supine to Sit ?  ?  ?Supine to sit: Supervision ?  ?  ?  ?  ? ?Transfers ?Overall transfer level: Needs assistance ?Equipment used: Rolling walker (2 wheels) ?Transfers: Sit to/from Stand ?Sit to Stand: Supervision ?  ?  ?  ?  ?  ?  ?  ? ?Ambulation/Gait ?Ambulation/Gait assistance: Min guard ?Gait Distance (Feet): 20 Feet ?Assistive device: Rolling walker (2 wheels) ?Gait  Pattern/deviations: Step-through pattern, Decreased step length - left, Decreased stance time - right, Decreased stride length ?Gait velocity: decreased ?  ?  ?  ? ? ?Stairs ?Stairs: Yes ?Stairs assistance: Min guard ?Stair Management: One rail Right, Two rails, Step to pattern, Sideways, Forwards ?Number of Stairs: 3 ?General stair comments: forward up with 2 rails, right rail down sideways ? ? ?Wheelchair Mobility ?  ? ?Modified Rankin (Stroke Patients Only) ?  ? ? ?  ?Balance Overall balance assessment: Needs assistance ?Sitting-balance support: No upper extremity supported ?Sitting balance-Leahy Scale: Good ?  ?  ?Standing balance support: Bilateral upper extremity supported ?Standing balance-Leahy Scale: Fair ?  ?  ?  ?  ?  ?  ?  ?  ?  ?  ?  ?  ?  ? ?  ?Cognition Arousal/Alertness: Awake/alert ?Behavior During Therapy: [facility] for tasks assessed/performed ?Overall Cognitive Status: Within Functional Limits for tasks assessed ?  ?  ?  ?  ?  ?  ?  ?  ?  ?  ?  ?  ?  ?  ?  ?  ?  ?  ?  ? ?  ?Exercises   ? ?  ?General Comments   ?  ?  ? ?Pertinent Vitals/Pain Pain Assessment ?Pain Assessment: Faces ?Faces Pain Scale: Hurts little more ?Pain Location: R Hip, R shoulder prior TSR ?Pain Descriptors / Indicators: Aching ?Pain Intervention(s): Limited activity within patient's tolerance, Monitored during session, Premedicated before session, Repositioned  ? ? ?Home Living   ?  ?  ?  ?  ?  ?  ?  ?  ?  ?   ?  ?  Prior Function    ?  ?  ?   ? ?PT Goals (current goals can now be found in the care plan section) Progress towards PT goals: Progressing toward goals ? ?  ?Frequency ? ? ? BID ? ? ? ?  ?PT Plan Current plan remains appropriate  ? ? ?Co-evaluation   ?  ?  ?  ?  ? ?  ?AM-PAC PT "6 Clicks" Mobility   ?Outcome Measure ? Help needed turning from your back to your side while in a flat bed without using bedrails?: A Little ?Help needed moving from lying on your back to sitting on the side of a flat bed without using bedrails?: A  Little ?Help needed moving to and from a bed to a chair (including a wheelchair)?: A Little ?Help needed standing up from a chair using your arms (e.g., wheelchair or bedside chair)?: A Little ?Help needed to walk in hospital room?: A Little ?Help needed climbing 3-5 steps with a railing? : A Little ?6 Click Score: 18 ? ?  ?End of Session Equipment Utilized During Treatment: Gait belt ?Activity Tolerance: Patient tolerated treatment well;Patient limited by pain;Other (comment) ?Patient left: in chair;with call bell/phone within reach;with chair alarm set;with family/visitor present ?Nurse Communication: Mobility status ?PT Visit Diagnosis: Unsteadiness on feet (R26.81);Other abnormalities of gait and mobility (R26.89);Muscle weakness (generalized) (M62.81);Difficulty in walking, not elsewhere classified (R26.2);Pain ?Pain - Right/Left: Right ?Pain - part of body: Hip ?  ? ? ?Time: 0936-1000 ?PT Time Calculation (min) (ACUTE ONLY): 24 min ? ?Charges:  $Gait Training: 23-37 mins          ?         Chesley Noon, PTA ?12/01/21, 10:04 AM ? ?

## 2021-12-01 NOTE — Plan of Care (Signed)

## 2021-12-01 NOTE — TOC Transition Note (Addendum)
Transition of Care (TOC) - Progression Note  ? ? ?Patient Details  ?Name: Stacy Martinez ?MRN: [id number] ?Date of Birth: November 09, 1955 ? ?Transition of Care (TOC) CM/SW Contact  ?Karlye Ihrig, LCSW ?Phone Number: [phone] ?12/01/2021, 10:14 AM ? ?Clinical Narrative:    ? ? ?Plan for d/c today, discharge summary in. Patient will d/c home with home health PT, active [facility], confirmed by [employer].  [employer] has been contacted to deliver walker by 1:00PM. No other TOC needs. ?  ?  ? ?Expected Discharge Plan and Services ?  ?  ?  ?  ?  ?Expected Discharge Date: 12/01/21               ?  ?  ?  ?  ?  ?  ?  ?  ?  ?  ? ? ?Social Determinants of Health (SDOH) Interventions ?  ? ?Readmission Risk Interventions ?   ? View : No data to display.  ?  ?  ?  ? ? ?

## 2021-12-01 NOTE — Progress Notes (Signed)
Subjective: ?2 Days Post-Op Procedure(s) (LRB): ?TOTAL HIP ARTHROPLASTY ANTERIOR APPROACH (Right) ?Patient reports pain as mild this morning. ?Patient is well, reports that she is feeling much better this morning. ?Plan is to go Home after hospital stay. ?Negative for chest pain and shortness of breath ?Fever: no ?Gastrointestinal:Patient reports her nausea is much improved this morning. ? ?Objective: ?Vital signs in last 24 hours: ?Temp:  [97.9 ?F (36.6 ?C)-99.6 ?F (37.6 ?C)] 99.6 ?F (37.6 ?C) (04/02 8882) ?Pulse Rate:  [74-78] 78 (04/02 0828) ?Resp:  [15-18] 18 (04/02 8003) ?BP: (101-135)/(43-61) 124/43 (04/02 4917) ?SpO2:  [93 %-100 %] 97 % (04/02 0828) ? ?Intake/Output from previous day: ? ?Intake/Output Summary (Last 24 hours) at 12/01/2021 0908 ?Last data filed at 12/01/2021 0524 ?Gross per 24 hour  ?Intake --  ?Output 0 ml  ?Net 0 ml  ?  ?Intake/Output this shift: ?No intake/output data recorded. ? ?Labs: ?Recent Labs  ?  11/29/21 ?1646 11/30/21 ?0615 12/01/21 ?9150  ?HGB 10.7* 10.4* 9.6*  ? ?Recent Labs  ?  11/30/21 ?0615 12/01/21 ?5697  ?WBC 11.9* 10.8*  ?RBC 4.17 3.77*  ?HCT 33.7* 30.5*  ?PLT 376 352  ? ?Recent Labs  ?  11/29/21 ?1646 11/30/21 ?0615  ?NA  --  138  ?K  --  4.6  ?CL  --  105  ?CO2  --  25  ?BUN  --  14  ?CREATININE 0.78 0.75  ?GLUCOSE  --  119*  ?CALCIUM  --  8.4*  ? ?No results for input(s): LABPT, INR in the last 72 hours. ? ? ?EXAM ?General - Patient is Alert, Appropriate, and Oriented ?Extremity - ABD soft ?Neurovascular intact ?Sensation intact distally ?Dorsiflexion/Plantar flexion intact ?No cellulitis present ?Compartment soft ?Dressing/Incision - Prevena intact to the right hip without any drainage present. ?Motor Function - intact, moving foot and toes well on exam.  ?Abdomen soft with normal bowel sounds. ? ?Past Medical History:  ?Diagnosis Date  ? Allergy   ? Arthritis   ? Diabetes mellitus without complication ([place])   ? Enlarged heart   ? Hypertension   ? ? ?Assessment/Plan: ?2  Days Post-Op Procedure(s) (LRB): ?TOTAL HIP ARTHROPLASTY ANTERIOR APPROACH (Right) ?Principal Problem: ?  Status post total hip replacement, right ? ?Estimated body mass index is 43.9 kg/m? as calculated from the following: ?  Height as of this encounter: '5\' 2"'$  (1.575 m). ?  Weight as of this encounter: 108.9 kg. ?Advance diet ?Up with therapy ?D/C IV fluids  when tolerating po intake. ? ?Labs reviewed this AM, Hg 9.6, WBC 10.8.   ?Patient is feeling much better this morning.  States her pain is under much better control. ?She is urinating without pain.  Lactulose added to aid with BM. ?Up with therapy today.  Plan for possible d/c home today pending progress with therapy. ? ?DVT Prophylaxis - Lovenox, TED hose, and SCDs ?Weight-Bearing as tolerated to right leg ? ?Raquel Shawntee Mainwaring, PA-C ?[facility]opaedic [facility] ?12/01/2021, 9:08 AM  ?

## 2021-12-01 NOTE — Discharge Summary (Signed)
?Physician Discharge Summary  ?Patient ID: ?Stacy Martinez ?MRN: [id number] ?DOB/AGE: December 17, 1955 66 y.o. ? ?Admit date: 11/29/2021 ?Discharge date: 12/01/2021 ? ?Admission Diagnoses:  ?Status post total hip replacement, right [Z96.641] ?Primary osteoarthritis of the right hip. ? ?Discharge Diagnoses: ?Patient Active Problem List  ? Diagnosis Date Noted  ? Status post total hip replacement, right 11/29/2021  ? Thrombocytosis 10/08/2021  ? Morbid obesity ([place]) 06/04/2021  ? Chronic pain syndrome 04/24/2021  ? Hyperlipidemia associated with type 2 diabetes mellitus ([place]) 11/30/2015  ? Cardiomyopathy ([place]) 11/30/2015  ? Arthritis 11/30/2015  ? BP (high blood pressure) 04/18/2015  ? DM type 2 with diabetic peripheral neuropathy ([place]) 04/18/2015  ? ? ?Past Medical History:  ?Diagnosis Date  ? Allergy   ? Arthritis   ? Diabetes mellitus without complication ([place])   ? Enlarged heart   ? Hypertension   ? ?  ?Transfusion: None. ?  ?Consultants (if any):  ? ?Discharged Condition: Improved ? ?Hospital Course: Stacy Martinez is an 66 y.o. female who was admitted 11/29/2021 with a diagnosis of primary osteoarthritis of the right hip and went to the operating room on 11/29/2021 and underwent the above named procedures.  ?  ?Surgeries: Procedure(s): ?TOTAL HIP ARTHROPLASTY ANTERIOR APPROACH on 11/29/2021 ?Patient tolerated the surgery well. Taken to PACU where she was stabilized and then transferred to the orthopedic floor. ? ?Started on Lovenox '40mg'$  q 24 hrs. Foot pumps applied bilaterally at 80 mm. Heels elevated on bed with rolled towels. No evidence of DVT. Negative [place]. ?Physical therapy started on day #1 for gait training and transfer. OT started day #1 for ADL and assisted devices. ? ?Patient's IV was removed on POD2. ? ?Implants: Medacta Masterloc 6 LAT stem with ceramic L 28 mm head, MpactDM 52 mm cup and liner ? ?She was given perioperative antibiotics:  ?Anti-infectives (From admission, onward)  ? ? Start     Dose/Rate Route  Frequency Ordered Stop  ? 11/29/21 1900  ceFAZolin (ANCEF) IVPB 2g/100 mL premix       ? 2 g ?200 mL/hr over 30 Minutes Intravenous Every 6 hours 11/29/21 1631 11/30/21 0202  ? 11/29/21 0600  ceFAZolin (ANCEF) IVPB 2g/100 mL premix       ? 2 g ?200 mL/hr over 30 Minutes Intravenous On call to O.R. 11/28/21 2148 11/29/21 1254  ? ?  ?. ? ?She was given sequential compression devices, early ambulation, and Lovenox for DVT prophylaxis. ? ?She benefited maximally from the hospital stay and there were no complications.   ? ?Recent vital signs:  ?Vitals:  ? 12/01/21 0524 12/01/21 0828  ?BP: (!) 101/58 (!) 124/43  ?Pulse: 77 78  ?Resp: 16 18  ?Temp: 98.8 ?F (37.1 ?C) 99.6 ?F (37.6 ?C)  ?SpO2: 95% 97%  ? ? ?Recent laboratory studies:  ?Lab Results  ?Component Value Date  ? HGB 9.6 (L) 12/01/2021  ? HGB 10.4 (L) 11/30/2021  ? HGB 10.7 (L) 11/29/2021  ? ?Lab Results  ?Component Value Date  ? WBC 10.8 (H) 12/01/2021  ? PLT 352 12/01/2021  ? ?No results found for: INR ?Lab Results  ?Component Value Date  ? NA 138 11/30/2021  ? K 4.6 11/30/2021  ? CL 105 11/30/2021  ? CO2 25 11/30/2021  ? BUN 14 11/30/2021  ? CREATININE 0.75 11/30/2021  ? GLUCOSE 119 (H) 11/30/2021  ? ? ?Discharge Medications:   ?Allergies as of 12/01/2021   ? ?   Reactions  ? Prednisone Swelling  ? Metformin And Related   ?  Severe nausea and diarrhea even with extended release versions  ? ?  ? ?  ?Medication List  ?  ? ?STOP taking these medications   ? ?diclofenac 75 MG EC tablet ?Commonly known as: VOLTAREN ?  ?nitrofurantoin (macrocrystal-monohydrate) 100 MG capsule ?Commonly known as: MACROBID ?  ? ?  ? ?TAKE these medications   ? ?acetaminophen 500 MG tablet ?Commonly known as: TYLENOL ?Take 1-2 tablets (500-1,000 mg total) by mouth every 6 (six) hours as needed. ?  ?amLODipine 10 MG tablet ?Commonly known as: NORVASC ?Take 1 tablet (10 mg total) by mouth daily. ?  ?carvedilol 25 MG tablet ?Commonly known as: COREG ?TAKE 1 TABLET BY MOUTH TWICE DAILY WITH  MEALS ?  ?empagliflozin 25 MG Tabs tablet ?Commonly known as: Jardiance ?Take 1 tablet (25 mg total) by mouth daily. ?  ?enalapril 20 MG tablet ?Commonly known as: VASOTEC ?Take 1 tablet by mouth twice daily ?  ?enoxaparin 40 MG/0.4ML injection ?Commonly known as: LOVENOX ?Inject 0.4 mLs (40 mg total) into the skin daily. ?Start taking on: December 02, 2021 ?  ?fenofibrate 54 MG tablet ?Take 1 tablet (54 mg total) by mouth daily. ?  ?furosemide 40 MG tablet ?Commonly known as: LASIX ?Take 1 tablet (40 mg total) by mouth daily. ?  ?gabapentin 300 MG capsule ?Commonly known as: NEURONTIN ?Take 1 capsule by mouth at bedtime ?  ?Levemir FlexTouch 100 UNIT/ML FlexPen ?Generic drug: insulin detemir ?As directed up to 45 units per day. ?  ?ondansetron 4 MG tablet ?Commonly known as: ZOFRAN ?Take 1 tablet (4 mg total) by mouth every 6 (six) hours as needed for nausea. ?  ?oxyCODONE 5 MG immediate release tablet ?Commonly known as: Oxy IR/ROXICODONE ?Take 1-2 tablets (5-10 mg total) by mouth every 4 (four) hours as needed for severe pain (pain score 4-6). ?  ?RELION GLUCOSE TEST STRIPS test strip ?Generic drug: glucose blood ?Use as instructed ?  ?ReliOn Ultra Thin Lancets Misc ?1 each by Does not apply route daily. ?  ?rosuvastatin 20 MG tablet ?Commonly known as: CRESTOR ?Take 1 tablet (20 mg total) by mouth daily. ?What changed: when to take this ?  ?sitaGLIPtin 100 MG tablet ?Commonly known as: Januvia ?Take 1 tablet by mouth once daily ?  ?tiZANidine 4 MG tablet ?Commonly known as: Zanaflex ?Take 1 tablet (4 mg total) by mouth every 8 (eight) hours as needed for muscle spasms. ?  ?traMADol 50 MG tablet ?Commonly known as: ULTRAM ?Take 1 tablet (50 mg total) by mouth every 6 (six) hours. ?  ?UltiCare Mini Pen Needles 32G X 6 MM Misc ?Generic drug: Insulin Pen Needle ?1 Bottle by Does not apply route as directed. ?  ? ?  ? ?  ?  ? ? ?  ?Durable Medical Equipment  ?(From admission, onward)  ?  ? ? ?  ? ?  Start     Ordered  ?  11/29/21 1632  DME Walker rolling  Once       ?Comments: Bariatric  ?Question Answer Comment  ?Walker: With 5 Inch Wheels   ?Patient needs a walker to treat with the following condition Status post total hip replacement, right   ?  ? 11/29/21 1631  ? 11/29/21 1632  DME 3 n 1  Once       ?Comments: Bariatric  ? 11/29/21 1631  ? 11/29/21 1632  DME Bedside commode  Once       ?Comments: Bariatric  ?Question:  Patient needs a bedside commode  to treat with the following condition  Answer:  Status post total hip replacement, right  ? 11/29/21 1631  ? ?  ?  ? ?  ? ? ?Diagnostic Studies: DG C-Arm 1-60 Min-No Report ? ?Result Date: 11/29/2021 ?Fluoroscopy was utilized by the requesting physician.  No radiographic interpretation.  ? ?DG C-Arm 1-60 Min-No Report ? ?Result Date: 11/29/2021 ?Fluoroscopy was utilized by the requesting physician.  No radiographic interpretation.  ? ?DG HIP UNILAT WITH PELVIS 1V RIGHT ? ?Result Date: 11/29/2021 ?CLINICAL DATA:  Right hip replacement. EXAM: DG HIP (WITH OR WITHOUT PELVIS) 1V RIGHT COMPARISON:  Hip radiographs 06/04/2021 FLUOROSCOPY: Fluoroscopy Time: 8 seconds Radiation Exposure Index: 1.47 mGy FINDINGS: Four intraoperative spot fluoroscopic images are provided during performance of a right total hip arthroplasty. No acute fracture or other complication is identified on these limited images. IMPRESSION: Intraoperative images during right hip arthroplasty. Electronically Signed   By: Logan Bores M.D.   On: 11/29/2021 14:24  ? ?DG HIP UNILAT W OR W/O PELVIS 2-3 VIEWS RIGHT ? ?Result Date: 11/29/2021 ?CLINICAL DATA:  Post right hip replacement EXAM: DG HIP (WITH OR WITHOUT PELVIS) 2-3V RIGHT COMPARISON:  Intraoperative hip radiographs obtained earlier the same day, hip radiographs 06/04/2021 FINDINGS: The patient is status post right hip arthroplasty. Hardware alignment is within expected limits, without evidence of complication. There is expected surrounding postoperative soft tissue  gas. IMPRESSION: Status post right hip arthroplasty without evidence of complication. Electronically Signed   By: Valetta Mole M.D.   On: 11/29/2021 15:37   ? ?Disposition: Plan for discharge home this afternoon

## 2021-12-01 NOTE — Discharge Instructions (Signed)
ANTERIOR APPROACH TOTAL HIP REPLACEMENT POSTOPERATIVE DIRECTIONS ? ? ?Hip Rehabilitation, Guidelines Following Surgery  ?The results of a hip operation are greatly improved after range of motion and muscle strengthening exercises. Follow all safety measures which are given to protect your hip. If any of these exercises cause increased pain or swelling in your joint, decrease the amount until you are comfortable again. Then slowly increase the exercises. Call your caregiver if you have problems or questions.  ? ?HOME CARE INSTRUCTIONS  ?Remove items at home which could result in a fall. This includes throw rugs or furniture in walking pathways.  ?ICE to the affected hip every three hours for 30 minutes at a time and then as needed for pain and swelling.  Continue to use ice on the hip for pain and swelling from surgery. You may notice swelling that will progress down to the foot and ankle.  This is normal after surgery.  Elevate the leg when you are not up walking on it.   ?Continue to use the breathing machine which will help keep your temperature down.  It is common for your temperature to cycle up and down following surgery, especially at night when you are not up moving around and exerting yourself.  The breathing machine keeps your lungs expanded and your temperature down. ?Do not place pillow under knee, focus on keeping the knee straight while resting ? ?DIET ?You may resume your previous home diet once your are discharged from the hospital. ? ?DRESSING / WOUND CARE / SHOWERING ?Leave Prevena Woundvac intact.  This will stop working in 5-7 days, can change to standard dry dressing at this time. ?Keep your dressing dry with showering.  You can keep it covered and pat dry. ?Change the surgical dressing daily and reapply a dry dressing each time. ? ?ACTIVITY ?Walk with your walker as instructed. ?Use walker as long as suggested by your caregivers. ?Avoid periods of inactivity such as sitting longer than an hour  when not asleep. This helps prevent blood clots.  ?You may resume a sexual relationship in one month or when given the OK by your doctor.  ?You may return to work once you are cleared by your doctor.  ?Do not drive a car for 6 weeks or until released by you surgeon.  ?Do not drive while taking narcotics. ? ?WEIGHT BEARING ?Weight bearing as tolerated with assist device (walker, cane, etc) as directed, use it as long as suggested by your surgeon or therapist, typically at least 4-6 weeks. ? ?POSTOPERATIVE CONSTIPATION PROTOCOL ?Constipation - defined medically as fewer than three stools per week and severe constipation as less than one stool per week. ? ?One of the most common issues patients have following surgery is constipation.  Even if you have a regular bowel pattern at home, your normal regimen is likely to be disrupted due to multiple reasons following surgery.  Combination of anesthesia, postoperative narcotics, change in appetite and fluid intake all can affect your bowels.  In order to avoid complications following surgery, here are some recommendations in order to help you during your recovery period. ? ?Colace (docusate) - Pick up an over-the-counter form of Colace or another stool softener and take twice a day as long as you are requiring postoperative pain medications.  Take with a full glass of water daily.  If you experience loose stools or diarrhea, hold the colace until you stool forms back up.  If your symptoms do not get better within 1 week or if  they get worse, check with your doctor. ? ?Dulcolax (bisacodyl) - Pick up over-the-counter and take as directed by the product packaging as needed to assist with the movement of your bowels.  Take with a full glass of water.  Use this product as needed if not relieved by Colace only.  ? ?MiraLax (polyethylene glycol) - Pick up over-the-counter to have on hand.  MiraLax is a solution that will increase the amount of water in your bowels to assist with  bowel movements.  Take as directed and can mix with a glass of water, juice, soda, coffee, or tea.  Take if you go more than two days without a movement. ?Do not use MiraLax more than once per day. Call your doctor if you are still constipated or irregular after using this medication for 7 days in a row. ? ?If you continue to have problems with postoperative constipation, please contact the office for further assistance and recommendations.  If you experience "the worst abdominal pain ever" or develop nausea or vomiting, please contact the office immediatly for further recommendations for treatment. ? ?ITCHING ? If you experience itching with your medications, try taking only a single pain pill, or even half a pain pill at a time.  You can also use Benadryl over the counter for itching or also to help with sleep.  ? ?TED HOSE STOCKINGS ?Wear the elastic stockings on both legs for three weeks following surgery during the day but you may remove then at night for sleeping. ? ?MEDICATIONS ?See your medication summary on the ?After Visit Summary? that the nursing staff will review with you prior to discharge.  You may have some home medications which will be placed on hold until you complete the course of blood thinner medication.  It is important for you to complete the blood thinner medication as prescribed by your surgeon.  Continue your approved medications as instructed at time of discharge. ? ?PRECAUTIONS ?If you experience chest pain or shortness of breath - call 911 immediately for transfer to the hospital emergency department.  ?If you develop a fever greater that 101 F, purulent drainage from wound, increased redness or drainage from wound, foul odor from the wound/dressing, or calf pain - CONTACT YOUR SURGEON.   ?                                                ?FOLLOW-UP APPOINTMENTS ?Make sure you keep all of your appointments after your operation with your surgeon and caregivers. You should call the office  at the above phone number and make an appointment for approximately two weeks after the date of your surgery or on the date instructed by your surgeon outlined in the "After Visit Summary". ? ?RANGE OF MOTION AND STRENGTHENING EXERCISES  ?These exercises are designed to help you keep full movement of your hip joint. Follow your caregiver's or physical therapist's instructions. Perform all exercises about fifteen times, three times per day or as directed. Exercise both hips, even if you have had only one joint replacement. These exercises can be done on a training (exercise) mat, on the floor, on a table or on a bed. Use whatever works the best and is most comfortable for you. Use music or television while you are exercising so that the exercises are a pleasant break in your day. This will make your  life better with the exercises acting as a break in routine you can look forward to.  ?Lying on your back, slowly slide your foot toward your buttocks, raising your knee up off the floor. Then slowly slide your foot back down until your leg is straight again.  ?Lying on your back spread your legs as far apart as you can without causing discomfort.  ?Lying on your side, raise your upper leg and foot straight up from the floor as far as is comfortable. Slowly lower the leg and repeat.  ?Lying on your back, tighten up the muscle in the front of your thigh (quadriceps muscles). You can do this by keeping your leg straight and trying to raise your heel off the floor. This helps strengthen the largest muscle supporting your knee.  ?Lying on your back, tighten up the muscles of your buttocks both with the legs straight and with the knee bent at a comfortable angle while keeping your heel on the floor.  ? ?IF YOU ARE TRANSFERRED TO A SKILLED REHAB FACILITY ?If the patient is transferred to a skilled rehab facility following release from the hospital, a list of the current medications will be sent to the facility for the patient  to continue.  When discharged from the skilled rehab facility, please have the facility set up the patient's [place] prior to being released. Also, the skilled facility will be respons

## 2021-12-01 NOTE — TOC Progression Note (Signed)
Transition of Care (TOC) - Progression Note  ? ? ?Patient Details  ?Name: Stacy Martinez ?MRN: [id number] ?Date of Birth: 1956-02-10 ? ?Transition of Care (TOC) CM/SW Contact  ?Kingston Guiles, LCSW ?Phone Number: [phone] ?12/01/2021, 2:19 PM ? ?Clinical Narrative:    ? ?CSW received update from DME provider, orders for DME state bariatric DME and patient's weight does not qualify her for bariatric DME, that insurance will cover.  CSW sent secure chat request to Attending for new orders without bariatric DME request.  Unit LPN updated. ? ?  ?  ? ?Expected Discharge Plan and Services ?  ?  ?  ?  ?  ?Expected Discharge Date: 12/01/21               ?  ?  ?  ?  ?  ?  ?  ?  ?  ?  ? ? ?Social Determinants of Health (SDOH) Interventions ?  ? ?Readmission Risk Interventions ?   ? View : No data to display.  ?  ?  ?  ? ? ?

## 2021-12-01 NOTE — Progress Notes (Signed)
Nurse Zoia found 2 unopened oxycodone pills on computer in room. According to pyxis, previous shift nurse Wilacynt removed med last night at 2233 but was never scanned/given. Attempted to call [facility] answer but left voicemail. Meds returned to pyxis inventory by this nurse. Patient states she was sick on her stomach so she decided not to take them after Wilacynt had pulled them. Meds returned to pyxis with no issue.  ?

## 2021-12-01 NOTE — Progress Notes (Signed)
Patient discharged to home with all belongings (including delivered rolling walker) accompanied amd wheeled out of unit by care RN Elana Alm and Beverlee Nims, Therapist, sports.  VSS. A+Ox4. PIV x 1 removed, no bleeding, intact. Pain managed. Medications and discharge instructions reviewed.  All questions answered.  Patient satisfied with overall care at [facility]. ?

## 2021-12-03 LAB — SURGICAL PATHOLOGY

## 2021-12-10 ENCOUNTER — Other Ambulatory Visit: Payer: Self-pay | Admitting: Student in an Organized Health Care Education/Training Program

## 2021-12-10 DIAGNOSIS — M19011 Primary osteoarthritis, right shoulder: Secondary | ICD-10-CM

## 2021-12-10 DIAGNOSIS — M47816 Spondylosis without myelopathy or radiculopathy, lumbar region: Secondary | ICD-10-CM

## 2022-01-08 ENCOUNTER — Other Ambulatory Visit: Payer: Self-pay | Admitting: Internal Medicine

## 2022-01-09 NOTE — Telephone Encounter (Signed)
Requested medication (s) are due for refill today:  ? ?Requested medication (s) are on the active medication list: no   ? ?Last refill: 09/05/21  15 ml  0 refills ? ?Future visit scheduled yes 01/14/22 ? ?Notes to clinic:Not on current med profile. Did not see D/C order. Please review. Thank you. ? ?Requested Prescriptions  ?Pending Prescriptions Disp Refills  ? LANTUS SOLOSTAR 100 UNIT/ML Solostar Pen [Pharmacy Med Name: Lantus SoloStar 100 UNIT/ML Subcutaneous Solution Pen-injector] 15 mL 0  ?  Sig: INJECT 32 UNITS SUBCUTANEOUSLY AT BEDTIME COURTESY  REFILL  MUST  SCHEDULE  OFFICE  VISIT  PRIOR  TO  FURTHER  REFILLS  ?  ? Endocrinology:  Diabetes - Insulins Passed - 01/08/2022  3:57 PM  ?  ?  Passed - HBA1C is between 0 and 7.9 and within 180 days  ?  Hemoglobin A1C  ?Date Value Ref Range Status  ?10/08/2021 7.4 (A) 4.0 - 5.6 % Final  ?01/06/2017 8.0  Final  ? ?Hgb A1c MFr Bld  ?Date Value Ref Range Status  ?04/08/2017 7.9 (H) <5.7 % Final  ?  Comment:  ?    ?For someone without known diabetes, a hemoglobin A1c value of 6.5% or ?greater indicates that they may have diabetes and this should be ?confirmed with a follow-up test. ?  ?For someone with known diabetes, a value <7% indicates that their ?diabetes is well controlled and a value greater than or equal to 7% ?indicates suboptimal control. A1c targets should be individualized ?based on duration of diabetes, age, comorbid conditions, and other ?considerations. ?  ?Currently, no consensus exists for use of hemoglobin A1c for diagnosis ?of diabetes for children. ?  ?  ?   ?  ?  Passed - Valid encounter within last 6 months  ?  Recent Outpatient Visits   ? ?      ? 3 months ago DM type 2 with diabetic peripheral neuropathy ([place])  ? [facility] [place], [place] W, NP  ? 7 months ago Chronic right hip pain  ? [facility] [place], [place] W, NP  ? 11 months ago DM type 2 with diabetic peripheral neuropathy [facility])  ? [facility]  [place], Coralie Keens, NP  ? 1 year ago Dysuria  ? [place], FNP  ? 1 year ago DM type 2 with diabetic peripheral neuropathy [facility])  ? [facility], Lupita Raider, FNP  ? ?  ?  ?Future Appointments   ? ?        ? In 5 days Baity, Coralie Keens, NP [facility]  ? ?  ? ? ?  ?  ?  ? ? ? ? ?

## 2022-01-14 ENCOUNTER — Ambulatory Visit (INDEPENDENT_AMBULATORY_CARE_PROVIDER_SITE_OTHER): Payer: Medicare Other | Admitting: Internal Medicine

## 2022-01-14 ENCOUNTER — Encounter: Payer: Self-pay | Admitting: Internal Medicine

## 2022-01-14 VITALS — BP 134/68 | HR 84 | Temp 97.6°F | Wt 241.0 lb

## 2022-01-14 DIAGNOSIS — E1142 Type 2 diabetes mellitus with diabetic polyneuropathy: Secondary | ICD-10-CM | POA: Diagnosis not present

## 2022-01-14 DIAGNOSIS — J069 Acute upper respiratory infection, unspecified: Secondary | ICD-10-CM

## 2022-01-14 DIAGNOSIS — D500 Iron deficiency anemia secondary to blood loss (chronic): Secondary | ICD-10-CM | POA: Diagnosis not present

## 2022-01-14 DIAGNOSIS — E1169 Type 2 diabetes mellitus with other specified complication: Secondary | ICD-10-CM

## 2022-01-14 DIAGNOSIS — I10 Essential (primary) hypertension: Secondary | ICD-10-CM | POA: Diagnosis not present

## 2022-01-14 DIAGNOSIS — E785 Hyperlipidemia, unspecified: Secondary | ICD-10-CM

## 2022-01-14 DIAGNOSIS — D509 Iron deficiency anemia, unspecified: Secondary | ICD-10-CM | POA: Insufficient documentation

## 2022-01-14 DIAGNOSIS — O903 Peripartum cardiomyopathy: Secondary | ICD-10-CM

## 2022-01-14 DIAGNOSIS — G894 Chronic pain syndrome: Secondary | ICD-10-CM

## 2022-01-14 LAB — POCT GLYCOSYLATED HEMOGLOBIN (HGB A1C): Hemoglobin A1C: 6.6 % — AB (ref 4.0–5.6)

## 2022-01-14 MED ORDER — KETOROLAC TROMETHAMINE 30 MG/ML IJ SOLN
30.0000 mg | Freq: Once | INTRAMUSCULAR | Status: AC
  Administered 2022-01-14: 30 mg via INTRAMUSCULAR

## 2022-01-14 MED ORDER — GABAPENTIN 300 MG PO CAPS: 300.0000 mg | ORAL_CAPSULE | Freq: Two times a day (BID) | ORAL | 1 refills | Status: DC

## 2022-01-14 NOTE — Patient Instructions (Signed)

## 2022-01-14 NOTE — Assessment & Plan Note (Signed)
CBC with differential and iron panel today ?

## 2022-01-14 NOTE — Assessment & Plan Note (Signed)
POCT A1c 6.6% ?Urine microalbumin has been checked within the last year ?Encouraged her to consume a low-carb diet and exercise for weight loss ?Continue Jardiance, Januvia and Lantus ?We will increase Gabapentin to 300 mg twice daily ?Encourage routine eye exam ?Encourage routine foot exam ?Encouraged her to get a flu shot in the fall ?Pneumonia vaccines UTD ?She declines COVID-vaccine ?

## 2022-01-14 NOTE — Assessment & Plan Note (Signed)
Encourage diet and exercise for weight loss 

## 2022-01-14 NOTE — Assessment & Plan Note (Signed)
Increase Gabapentin to#600 mg twice daily ?Continue Zanaflex and Voltaren as prescribed by pain management ?

## 2022-01-14 NOTE — Assessment & Plan Note (Signed)
C-Met and lipid profile today °Encouraged her to consume a low-fat diet °Continue Rosuvastatin and Fenofibrate °

## 2022-01-14 NOTE — Assessment & Plan Note (Signed)
CBC with differential today 

## 2022-01-14 NOTE — Assessment & Plan Note (Signed)
Controlled on Amlodipine, Enalapril, Carvedilol and Furosemide ?Reinforced DASH diet and exercise weight loss ?C-Met today ?

## 2022-01-14 NOTE — Progress Notes (Signed)
? ?Subjective:  ? ? Patient ID: Stacy Martinez, female    DOB: 1955-09-14, 66 y.o.   MRN: [id number] ? ?HPI ? ?Patient presents to clinic today for 65-monthfollow-up of chronic conditions. ? ?HTN with Cardiomyopathy: Her BP today is 134/68.  She is taking Amlodipine, Enalapril, Carvedilol and Furosemide as prescribed.  ECG from 11/2021 reviewed. ? ?HLD: Her last LDL was 102, triglycerides 257, 10/2021.  She denies myalgias on Rosuvastatin and Fenofibrate.  She tries to consume a low-fat diet. ? ?DM2 with Peripheral Neuropathy: Her last A1c was 7.45, 10/2021.  She is taking Jardiance, Januvia and Lantus as prescribed.  Her sugars range 120-150.  She takes Gabapentin for neuropathic pain but does feel like her pain has been worse since her right hip replacement.  Her last eye exam was 11/2021.  Flu 06/2021.  Prevnar 06/2021.  Pneumovax 06/2019.  COVID never. ? ?OA/Chronic Pain: Mainly in her right shoulder, back, hips and knees.  She is taking Voltaren, Gabapentin and Zanaflex as prescribed with some relief of symptoms.  She follows with pain management. ? ?Thrombocytosis: Her last platelet count was 352, 10/2021.  She is not following with hematology. ? ?Iron Deficiency Anemia: Her last H/H was 9.6/30.5, 10/2021. She is not taking any oral iron at this time She does not follow with hematology. ? ?She also reports headache, runny nose, sore throat, and cough. This started this morning. The headache is located in her forehead. She denies dizziness, vision changes or sensitivity to light or sound. She is blowing clear mucous out of her nose. She denies difficulty swallowing. The cough is non productive. She denies ear pain, nasal congestion, shortness of breath or chest pain. She denies fever or chills but has had body aches. She has not tried anything OTC for this. She has had sick contacts with similar symptoms. ? ?Review of Systems ? ?Past Medical History:  ?Diagnosis Date  ? Allergy   ? Arthritis   ? Diabetes [place]  complication [place]   ? Enlarged heart   ? Hypertension   ? ? ?Current Outpatient Medications  ?Medication Sig Dispense Refill  ? acetaminophen (TYLENOL) 500 MG tablet Take 1-2 tablets (500-1,000 mg total) by mouth every 6 (six) hours as needed. 60 tablet 0  ? amLODipine (NORVASC) 10 MG tablet Take 1 tablet (10 mg total) by mouth daily. 90 tablet 1  ? carvedilol (COREG) 25 MG tablet TAKE 1 TABLET BY MOUTH TWICE DAILY WITH MEALS 180 tablet 1  ? empagliflozin (JARDIANCE) 25 MG TABS tablet Take 1 tablet (25 mg total) by mouth daily. 90 tablet 1  ? enalapril (VASOTEC) 20 MG tablet Take 1 tablet by mouth twice daily 180 tablet 0  ? enoxaparin (LOVENOX) 40 MG/0.4ML injection Inject 0.4 mLs (40 mg total) into the skin daily. 5.6 mL 0  ? fenofibrate 54 MG tablet Take 1 tablet (54 mg total) by mouth daily. 90 tablet 1  ? furosemide (LASIX) 40 MG tablet Take 1 tablet (40 mg total) by mouth daily. 90 tablet 1  ? gabapentin (NEURONTIN) 300 MG capsule Take 1 capsule by mouth at bedtime 90 capsule 2  ? glucose blood (RELION GLUCOSE TEST STRIPS) test strip Use as instructed 100 each 12  ? insulin detemir (LEVEMIR FLEXTOUCH) 100 UNIT/ML FlexPen As directed up to 45 units per day. 15 mL 0  ? ondansetron (ZOFRAN) 4 MG tablet Take 1 tablet (4 mg total) by mouth every 6 (six) hours as needed for nausea. 30 tablet 0  ?  oxyCODONE (OXY IR/ROXICODONE) 5 MG immediate release tablet Take 1-2 tablets (5-10 mg total) by mouth every 4 (four) hours as needed for severe pain (pain score 4-6). 60 tablet 0  ? ReliOn Ultra Thin Lancets MISC 1 each by Does not apply route daily. 100 each 11  ? rosuvastatin (CRESTOR) 20 MG tablet Take 1 tablet (20 mg total) by mouth daily. (Patient taking differently: Take 20 mg by mouth at bedtime.) 90 tablet 1  ? sitaGLIPtin (JANUVIA) 100 MG tablet Take 1 tablet by mouth once daily 90 tablet 1  ? tiZANidine (ZANAFLEX) 4 MG tablet Take 1 tablet (4 mg total) by mouth every 8 (eight) hours as needed for muscle spasms.  30 tablet 0  ? traMADol (ULTRAM) 50 MG tablet Take 1 tablet (50 mg total) by mouth every 6 (six) hours. 30 tablet 0  ? ULTICARE MINI PEN NEEDLES 32G X 6 MM MISC 1 Bottle by Does not apply route as directed. 100 each 1  ? ?No current facility-administered medications for this visit.  ? ? ?Allergies  ?Allergen Reactions  ? Prednisone Swelling  ? Metformin And Related   ?  Severe nausea and diarrhea even with extended release versions  ? ? ?Family History  ?Problem Relation Age of Onset  ? Diabetes Mother   ? Cancer Father   ? Mental illness Son   ? ? ?Social History  ? ?Socioeconomic History  ? Marital status: Divorced  ?  Spouse name: Not on file  ? Number of children: 4  ? Years of education: Not on file  ? Highest education level: Not on file  ?Occupational History  ? Not on file  ?Tobacco Use  ? Smoking status: Never  ? Smokeless tobacco: Never  ?Vaping Use  ? Vaping Use: Never used  ?Substance and Sexual Activity  ? Alcohol use: No  ?  Alcohol/week: 0.0 standard drinks  ? Drug use: No  ? Sexual activity: Not on file  ?Other Topics Concern  ? Not on file  ?Social History Narrative  ? Not on file  ? ?Social Determinants of Health  ? ?Financial Resource Strain: Not on file  ?Food Insecurity: Not on file  ?Transportation Needs: Not on file  ?Physical Activity: Not on file  ?Stress: Not on file  ?Social Connections: Not on file  ?Intimate Partner Violence: Not on file  ? ? ? ?Constitutional: Pt reports headache. Denies fever, malaise, fatigue, or abrupt weight changes.  ?HEENT: Pt reports runny nose, sore throat. Denies eye pain, eye redness, ear pain, ringing in the ears, wax buildup,  nasal congestion, bloody nose ?Respiratory: Pt reports cough. Denies difficulty breathing, shortness of breath, or sputum production.   ?Cardiovascular: Denies chest pain, chest tightness, palpitations or swelling in the hands or feet.  ?Gastrointestinal: Denies abdominal pain, bloating, constipation, diarrhea or blood in the stool.   ?GU: Denies urgency, frequency, pain with urination, burning sensation, blood in urine, odor or discharge. ?Musculoskeletal: Pt reports joint pain. Denies decrease in range of motion, difficulty with gait, muscle pain or joint swelling.  ?Skin: Denies redness, rashes, lesions or ulcercations.  ?Neurological: Pt reports neuropathic pain. Denies dizziness, difficulty with memory, difficulty with speech or problems with balance and coordination.  ?Psych: Denies anxiety, depression, SI/HI. ? ?No other specific complaints in a complete review of systems (except as listed in HPI above). ? ?   ?Objective:  ? Physical Exam ? ? ?BP 134/68 (BP Location: Left Arm, Patient Position: Sitting, Cuff Size: Large)  Pulse 84   Temp 97.6 ?F (36.4 ?C) (Temporal)   Wt 241 lb (109.3 kg)   SpO2 99%   BMI 44.08 kg/m?  ? ?Wt Readings from Last 3 Encounters:  ?11/29/21 240 lb (108.9 kg)  ?11/15/21 244 lb 3.2 oz (110.8 kg)  ?10/08/21 245 lb (111.1 kg)  ? ? ?General: Appears her stated age, obese, in NAD. ?Skin: Warm, dry and intact. No ulcerations noted. ?HEENT: Head: normal shape and size; Eyes: sclera white, no icterus, conjunctiva pink, PERRLA and EOMs intact; Ears: Tm's gray and intact, normal light reflex;  Throat/Mouth: Teeth present, mucosa pink and moist, + PND, no exudate, lesions or ulcerations noted.  ?Neck: Bilateral anterior cervical adenopathy noted. ?Cardiovascular: Normal rate and rhythm. S1,S2 noted.  No murmur, rubs or gallops noted.  Trace nonpitting BLE edema. No carotid bruits noted. ?Pulmonary/Chest: Normal effort and positive vesicular breath sounds. No respiratory distress. No wheezes, rales or ronchi noted.  ?Musculoskeletal: Gait slow and steady with use of walker. ?Neurological: Alert and oriented. ? ? ?BMET ?   ?Component Value Date/Time  ? NA 138 11/30/2021 0615  ? NA 136 11/30/2015 1037  ? K 4.6 11/30/2021 0615  ? CL 105 11/30/2021 0615  ? CO2 25 11/30/2021 0615  ? GLUCOSE 119 (H) 11/30/2021 0615  ? BUN 14  11/30/2021 0615  ? BUN 19 11/30/2015 1037  ? CREATININE 0.75 11/30/2021 0615  ? CREATININE 0.77 10/08/2021 1505  ? CALCIUM 8.4 (L) 11/30/2021 0615  ? GFRNONAA >60 11/30/2021 0615  ? GFRNONAA 88 03/22/2019 0

## 2022-01-15 LAB — COMPLETE METABOLIC PANEL WITH GFR
AG Ratio: 1.3 (calc) (ref 1.0–2.5)
ALT: 10 U/L (ref 6–29)
AST: 10 U/L (ref 10–35)
Albumin: 4.3 g/dL (ref 3.6–5.1)
Alkaline phosphatase (APISO): 117 U/L (ref 37–153)
BUN: 11 mg/dL (ref 7–25)
CO2: 25 mmol/L (ref 20–32)
Calcium: 9.6 mg/dL (ref 8.6–10.4)
Chloride: 102 mmol/L (ref 98–110)
Creat: 0.81 mg/dL (ref 0.50–1.05)
Globulin: 3.4 g/dL (calc) (ref 1.9–3.7)
Glucose, Bld: 128 mg/dL (ref 65–139)
Potassium: 4.6 mmol/L (ref 3.5–5.3)
Sodium: 138 mmol/L (ref 135–146)
Total Bilirubin: 0.4 mg/dL (ref 0.2–1.2)
Total Protein: 7.7 g/dL (ref 6.1–8.1)
eGFR: 81 mL/min/{1.73_m2} (ref >=60)

## 2022-01-15 LAB — CBC WITH DIFFERENTIAL/PLATELET
Absolute Monocytes: 861 cells/uL (ref 200–950)
Basophils Absolute: 76 cells/uL (ref 0–200)
Basophils Relative: 0.5 %
Eosinophils Absolute: 196 cells/uL (ref 15–500)
Eosinophils Relative: 1.3 %
HCT: 37.2 % (ref 35.0–45.0)
Hemoglobin: 11.9 g/dL (ref 11.7–15.5)
Lymphs Abs: 2597 cells/uL (ref 850–3900)
MCH: 25.1 pg — ABNORMAL LOW (ref 27.0–33.0)
MCHC: 32 g/dL (ref 32.0–36.0)
MCV: 78.5 fL — ABNORMAL LOW (ref 80.0–100.0)
MPV: 10.6 fL (ref 7.5–12.5)
Monocytes Relative: 5.7 %
Neutro Abs: 11370 cells/uL — ABNORMAL HIGH (ref 1500–7800)
Neutrophils Relative %: 75.3 %
Platelets: 515 10*3/uL — ABNORMAL HIGH (ref 140–400)
RBC: 4.74 10*6/uL (ref 3.80–5.10)
RDW: 14.5 % (ref 11.0–15.0)
Total Lymphocyte: 17.2 %
WBC: 15.1 10*3/uL — ABNORMAL HIGH (ref 3.8–10.8)

## 2022-01-15 LAB — LIPID PANEL
Cholesterol: 177 mg/dL (ref <200)
HDL: 54 mg/dL (ref >=50)
LDL Cholesterol (Calc): 96 mg/dL (calc)
Non-HDL Cholesterol (Calc): 123 mg/dL (calc) (ref <130)
Total CHOL/HDL Ratio: 3.3 (calc) (ref <5.0)
Triglycerides: 174 mg/dL — ABNORMAL HIGH (ref <150)

## 2022-01-15 LAB — IRON,TIBC AND FERRITIN PANEL
%SAT: 5 % (calc) — ABNORMAL LOW (ref 16–45)
Ferritin: 17 ng/mL (ref 16–288)
Iron: 20 ug/dL — ABNORMAL LOW (ref 45–160)
TIBC: 380 mcg/dL (calc) (ref 250–450)

## 2022-02-19 ENCOUNTER — Other Ambulatory Visit: Payer: Self-pay | Admitting: Internal Medicine

## 2022-02-20 NOTE — Telephone Encounter (Signed)
Requested medication (s) are due for refill today:   Not sure  Requested medication (s) are on the active medication list:   Yes as a historical med  Future visit scheduled:   No   Seen a mo. ago   Last ordered: 11/06/2021 discontinued  Returned for provider to review.      Requested Prescriptions  Pending Prescriptions Disp Refills   LANTUS SOLOSTAR 100 UNIT/ML Solostar Pen [Pharmacy Med Name: Lantus SoloStar 100 UNIT/ML Subcutaneous Solution Pen-injector] 15 mL 0    Sig: INJECT 32 UNITS SUBCUTANEOUSLY AT BEDTIME COURTESY  REFILL  MUST  SCHEDULE  OFFICE  VISIT  PRIOR  TO  FURTHER  REFILLS     Endocrinology:  Diabetes - Insulins Passed - 02/19/2022  3:40 PM      Passed - HBA1C is between 0 and 7.9 and within 180 days    Hemoglobin A1C  Date Value Ref Range Status  01/14/2022 6.6 (A) 4.0 - 5.6 % Final  01/06/2017 8.0  Final   Hgb A1c MFr Bld  Date Value Ref Range Status  04/08/2017 7.9 (H) <5.7 % Final    Comment:      For someone without known diabetes, a hemoglobin A1c value of 6.5% or greater indicates that they may have diabetes and this should be confirmed with a follow-up test.   For someone with known diabetes, a value <7% indicates that their diabetes is well controlled and a value greater than or equal to 7% indicates suboptimal control. A1c targets should be individualized based on duration of diabetes, age, comorbid conditions, and other considerations.   Currently, no consensus exists for use of hemoglobin A1c for diagnosis of diabetes for children.            Passed - Valid encounter within last 6 months    Recent Outpatient Visits           1 month ago DM type 2 with diabetic peripheral neuropathy [facility])   [facility] [place], [place] W, NP   4 months ago DM type 2 with diabetic peripheral neuropathy [facility])   [facility], NP   8 months ago Chronic right hip pain   [facility] [place], [place], [place]    1 year ago DM type 2 with diabetic peripheral neuropathy [facility])   [facility], Coralie Keens, NP   1 year ago [facility] [place], Lupita Raider, [place]

## 2022-02-21 ENCOUNTER — Other Ambulatory Visit: Payer: Self-pay

## 2022-02-21 MED ORDER — GABAPENTIN 300 MG PO CAPS: 300.0000 mg | ORAL_CAPSULE | Freq: Two times a day (BID) | ORAL | 1 refills | Status: DC

## 2022-03-03 ENCOUNTER — Telehealth: Payer: Self-pay

## 2022-03-03 NOTE — Telephone Encounter (Signed)
Copied from [place]. Topic: General - Other >> Mar 03, 2022  1:33 PM Leitha Schuller wrote: Reason for CRM: Patient states pharmacy will not fill until 7-10 due to the change in direction from once a day to twice a day  Patient states she is currently out of medication   Please assist patient further

## 2022-03-24 ENCOUNTER — Ambulatory Visit: Payer: Self-pay

## 2022-03-24 NOTE — Telephone Encounter (Signed)
  Chief Complaint: frequency and painful urination, Cramping and diarrhea.Fever Symptoms: ibid Frequency: Since Saturday night Pertinent Negatives: Patient denies  Disposition: '[]'$ ED /'[x]'$ Urgent Care (no appt availability in office) / '[]'$ Appointment(In office/virtual)/ '[]'$  [facility]/ '[]'$ Home Care/ '[]'$ Refused Recommended Disposition /'[]'$ [facility]/ '[]'$  Follow-up with PCP Additional Notes: Pt started with s/s Saturday night of frequent urination and painful urination. Pt now also has cramping and diarrhea.  Summary: UTI Sx advice   Pt is calling to UTI sx with burning, temp of 101.5, with urgency to use the restroom. No available appt with [facility] today.      Reason for Disposition  Fever > 100.4 F (38.0 C)  Answer Assessment - Initial Assessment Questions 1. SYMPTOM: "What's the main symptom you're concerned about?" (e.g., frequency, incontinence)     Urgency and painful urination 2. ONSET: "When did the  UTI  start?"     Saturday evening 3. PAIN: "Is there any pain?" If Yes, ask: "How bad is it?" (Scale: 1-10; mild, moderate, severe)     Just when urinating 4. CAUSE: "What do you think is causing the symptoms?"     UTI 5. OTHER SYMPTOMS: "Do you have any other symptoms?" (e.g., blood in urine, fever, flank pain, pain with urination)     Diarrhea and cramps 6. PREGNANCY: "Is there any chance you are pregnant?" "When was your last menstrual period?"     no  Protocols used: Urinary Symptoms-A-AH

## 2022-04-08 ENCOUNTER — Other Ambulatory Visit: Payer: Self-pay | Admitting: Internal Medicine

## 2022-04-08 DIAGNOSIS — E1142 Type 2 diabetes mellitus with diabetic polyneuropathy: Secondary | ICD-10-CM

## 2022-04-08 NOTE — Telephone Encounter (Signed)
Requested Prescriptions  Pending Prescriptions Disp Refills  . JARDIANCE 25 MG TABS tablet [Pharmacy Med Name: Jardiance 25 MG Oral Tablet] 90 tablet 0    Sig: Take 1 tablet by mouth once daily     Endocrinology:  Diabetes - SGLT2 Inhibitors Passed - 04/08/2022 12:40 PM      Passed - Cr in normal range and within 360 days    Creat  Date Value Ref Range Status  01/14/2022 0.81 0.50 - 1.05 mg/dL Final   Creatinine, Urine  Date Value Ref Range Status  10/08/2021 49 20 - 275 mg/dL Final         Passed - HBA1C is between 0 and 7.9 and within 180 days    Hemoglobin A1C  Date Value Ref Range Status  01/14/2022 6.6 (A) 4.0 - 5.6 % Final  01/06/2017 8.0  Final   Hgb A1c MFr Bld  Date Value Ref Range Status  04/08/2017 7.9 (H) <5.7 % Final    Comment:      For someone without known diabetes, a hemoglobin A1c value of 6.5% or greater indicates that they may have diabetes and this should be confirmed with a follow-up test.   For someone with known diabetes, a value <7% indicates that their diabetes is well controlled and a value greater than or equal to 7% indicates suboptimal control. A1c targets should be individualized based on duration of diabetes, age, comorbid conditions, and other considerations.   Currently, no consensus exists for use of hemoglobin A1c for diagnosis of diabetes for children.            Passed - eGFR in normal range and within 360 days    GFR, Est African American  Date Value Ref Range Status  03/22/2019 102 > OR = 60 mL/min/1.73m2 Final   GFR, Est Non African American  Date Value Ref Range Status  03/22/2019 88 > OR = 60 mL/min/1.73m2 Final   GFR, Estimated  Date Value Ref Range Status  11/30/2021 >60 >60 mL/min Final    Comment:    (NOTE) Calculated using the CKD-EPI Creatinine Equation (2021)    eGFR  Date Value Ref Range Status  01/14/2022 81 > OR = 60 mL/min/1.73m2 Final    Comment:    The eGFR is based on the CKD-EPI 2021 equation. To  calculate  the new eGFR from a previous Creatinine or Cystatin C result, go to https://www.kidney.org/professionals/ kdoqi/gfr%5Fcalculator          Passed - Valid encounter within last 6 months    Recent Outpatient Visits          2 months ago DM type 2 with diabetic peripheral neuropathy (HCC)   [facility] Baity, [name] W, NP   6 months ago DM type 2 with diabetic peripheral neuropathy (HCC)   [facility] Baity, [name] W, NP   10 months ago Chronic right hip pain   [facility] Baity, [name] W, NP   1 year ago DM type 2 with diabetic peripheral neuropathy (HCC)   [facility] Baity, [name] W, NP   1 year ago Dysuria   [facility] [place], Nicole M, FNP             . sitaGLIPtin (JANUVIA) 100 MG tablet 90 tablet 0    Sig: Take 1 tablet by mouth once daily     Endocrinology:  Diabetes - DPP-4 Inhibitors Passed - 04/08/2022 12:40 PM        Passed - HBA1C is between 0 and 7.9 and within 180 days    Hemoglobin A1C  Date Value Ref Range Status  01/14/2022 6.6 (A) 4.0 - 5.6 % Final  01/06/2017 8.0  Final   Hgb A1c MFr Bld  Date Value Ref Range Status  04/08/2017 7.9 (H) <5.7 % Final    Comment:      For someone without known diabetes, a hemoglobin A1c value of 6.5% or greater indicates that they may have diabetes and this should be confirmed with a follow-up test.   For someone with known diabetes, a value <7% indicates that their diabetes is well controlled and a value greater than or equal to 7% indicates suboptimal control. A1c targets should be individualized based on duration of diabetes, age, comorbid conditions, and other considerations.   Currently, no consensus exists for use of hemoglobin A1c for diagnosis of diabetes for children.            Passed - Cr in normal range and within 360 days    Creat  Date Value Ref Range Status  01/14/2022 0.81 0.50 - 1.05 mg/dL Final    Creatinine, Urine  Date Value Ref Range Status  10/08/2021 49 20 - 275 mg/dL Final         Passed - Valid encounter within last 6 months    Recent Outpatient Visits          2 months ago DM type 2 with diabetic peripheral neuropathy (HCC)   [facility] Baity, [name] W, NP   6 months ago DM type 2 with diabetic peripheral neuropathy (HCC)   [facility] Baity, [name] W, NP   10 months ago Chronic right hip pain   [facility] Baity, [name] W, NP   1 year ago DM type 2 with diabetic peripheral neuropathy (HCC)   [facility] Baity, [name] W, NP   1 year ago Dysuria   [facility] Malfi, Nicole M, FNP               

## 2022-04-29 ENCOUNTER — Other Ambulatory Visit: Payer: Self-pay | Admitting: Internal Medicine

## 2022-04-29 MED ORDER — LEVEMIR FLEXTOUCH 100 UNIT/ML ~~LOC~~ SOPN: 32.0000 [IU] | PEN_INJECTOR | Freq: Every day | SUBCUTANEOUS | 0 refills | Status: DC

## 2022-05-21 ENCOUNTER — Other Ambulatory Visit: Payer: Self-pay | Admitting: Internal Medicine

## 2022-05-21 DIAGNOSIS — I10 Essential (primary) hypertension: Secondary | ICD-10-CM

## 2022-05-22 NOTE — Telephone Encounter (Signed)
Requested Prescriptions  Pending Prescriptions Disp Refills  . carvedilol (COREG) 25 MG tablet [Pharmacy Med Name: Carvedilol 25 MG Oral Tablet] 180 tablet 0    Sig: TAKE 1 TABLET BY MOUTH TWICE DAILY WITH MEALS     Cardiovascular: Beta Blockers 3 Passed - 05/21/2022  1:21 PM      Passed - Cr in normal range and within 360 days    Creat  Date Value Ref Range Status  01/14/2022 0.81 0.50 - 1.05 mg/dL Final   Creatinine, Urine  Date Value Ref Range Status  10/08/2021 49 20 - 275 mg/dL Final         Passed - AST in normal range and within 360 days    AST  Date Value Ref Range Status  01/14/2022 10 10 - 35 U/L Final         Passed - ALT in normal range and within 360 days    ALT  Date Value Ref Range Status  01/14/2022 10 6 - 29 U/L Final         Passed - Last BP in normal range    BP Readings from Last 1 Encounters:  01/14/22 134/68         Passed - Last Heart Rate in normal range    Pulse Readings from Last 1 Encounters:  01/14/22 84         Passed - Valid encounter within last 6 months    Recent Outpatient Visits          4 months ago DM type 2 with diabetic peripheral neuropathy ([place])   [facility] [place], Coralie Keens, NP   7 months ago DM type 2 with diabetic peripheral neuropathy [facility])   [facility], NP   11 months ago Chronic right hip pain   [facility] [place], [place] W, NP   1 year ago DM type 2 with diabetic peripheral neuropathy [facility])   [facility] [place], Coralie Keens, NP   1 year ago [place], FNP             . amLODipine (Glasco) 10 MG tablet [Pharmacy Med Name: amLODIPine Besylate 10 MG Oral Tablet] 90 tablet 0    Sig: Take 1 tablet by mouth once daily     Cardiovascular: Calcium Channel Blockers 2 Passed - 05/21/2022  1:21 PM      Passed - Last BP in normal range    BP Readings from Last 1 Encounters:  01/14/22 134/68          Passed - Last Heart Rate in normal range    Pulse Readings from Last 1 Encounters:  01/14/22 84         Passed - Valid encounter within last 6 months    Recent Outpatient Visits          4 months ago DM type 2 with diabetic peripheral neuropathy [facility])   [facility] [place], Coralie Keens, NP   7 months ago DM type 2 with diabetic peripheral neuropathy [facility])   [facility], NP   11 months ago Chronic right hip pain   [facility] [place], [place] W, NP   1 year ago DM type 2 with diabetic peripheral neuropathy [facility])   [facility] [place], Coralie Keens, NP   1 year ago [place], Lupita Raider, FNP

## 2022-07-11 ENCOUNTER — Ambulatory Visit: Payer: Self-pay

## 2022-07-11 NOTE — Telephone Encounter (Signed)
Message from [place] sent at 07/11/2022  1:04 PM EST  Summary: Pharmacy requesting Rx clarification.   Melissa from [facility] stated pt has two different prescriptions for the medication gabapentin (NEURONTIN) 300 MG capsule with different directions.  Pharmacy requesting clarification.        Spoke to [facility] and clarified Gabapentin rx: per current med list: Gabapentin Take 1 capsule (300 mg ) total by mouth 2 (two) times daily last prescribed 02/21/22 #180 1 RF.Marland Kitchen  Reason for Disposition  Caller has medicine question only, adult not sick, AND triager answers question  Answer Assessment - Initial Assessment Questions 1. NAME of MEDICINE: "What medicine(s) are you calling about?"     Gabapentin 2. QUESTION: "What is your question?" (e.g., double dose of medicine, side effect)     Is gabapentin once or twice daily.  3. PRESCRIBER: "Who prescribed the medicine?" Reason: if prescribed by specialist, call should be referred to that group.     PCP 4. SYMPTOMS: "Do you have any symptoms?" If Yes, ask: "What symptoms are you having?"  "How bad are the symptoms (e.g., mild, moderate, severe)     N/a 5. PREGNANCY:  "Is there any chance that you are pregnant?" "When was your last menstrual period?"     N/a  Protocols used: Medication Question Call-A-AH

## 2022-08-22 ENCOUNTER — Other Ambulatory Visit: Payer: Self-pay | Admitting: Internal Medicine

## 2022-08-22 DIAGNOSIS — E1142 Type 2 diabetes mellitus with diabetic polyneuropathy: Secondary | ICD-10-CM

## 2022-08-22 DIAGNOSIS — I10 Essential (primary) hypertension: Secondary | ICD-10-CM

## 2022-08-22 NOTE — Telephone Encounter (Signed)
Requested medication (s) are due for refill today: routing for review  Requested medication (s) are on the active medication list: yes  Last refill:  multiple dates  Future visit scheduled: no  Notes to clinic:  Unable to refill per protocol, appointment needed.      Requested Prescriptions  Pending Prescriptions Disp Refills   furosemide (LASIX) 40 MG tablet [Pharmacy Med Name: Furosemide 40 MG Oral Tablet] 90 tablet 0    Sig: Take 1 tablet by mouth once daily     Cardiovascular:  Diuretics - Loop Failed - 08/22/2022  1:34 PM      Failed - K in normal range and within 180 days    Potassium  Date Value Ref Range Status  01/14/2022 4.6 3.5 - 5.3 mmol/L Final         Failed - Ca in normal range and within 180 days    Calcium  Date Value Ref Range Status  01/14/2022 9.6 8.6 - 10.4 mg/dL Final         Failed - Na in normal range and within 180 days    Sodium  Date Value Ref Range Status  01/14/2022 138 135 - 146 mmol/L Final  11/30/2015 136 134 - 144 mmol/L Final         Failed - Cr in normal range and within 180 days    Creat  Date Value Ref Range Status  01/14/2022 0.81 0.50 - 1.05 mg/dL Final   Creatinine, Urine  Date Value Ref Range Status  10/08/2021 49 20 - 275 mg/dL Final         Failed - Cl in normal range and within 180 days    Chloride  Date Value Ref Range Status  01/14/2022 102 98 - 110 mmol/L Final         Failed - Mg Level in normal range and within 180 days    No results found for: "MG"       Failed - Valid encounter within last 6 months    Recent Outpatient Visits           7 months ago DM type 2 with diabetic peripheral neuropathy [facility])   [facility], NP   10 months ago DM type 2 with diabetic peripheral neuropathy [facility])   [facility], NP   1 year ago Chronic right hip pain   [facility] [place], Coralie Keens, NP   1 year ago DM type 2 with diabetic peripheral  neuropathy [facility])   [facility], Coralie Keens, NP   1 year ago [place], Lupita Raider, [place]              Passed - Last BP in normal range    BP Readings from Last 1 Encounters:  01/14/22 134/68          JANUVIA 100 MG tablet [Pharmacy Med Name: Januvia 100 MG Oral Tablet] 90 tablet 0    Sig: Take 1 tablet by mouth once daily     Endocrinology:  Diabetes - DPP-4 Inhibitors Failed - 08/22/2022  1:34 PM      Failed - HBA1C is between 0 and 7.9 and within 180 days    Hemoglobin A1C  Date Value Ref Range Status  01/14/2022 6.6 (A) 4.0 - 5.6 % Final  01/06/2017 8.0  Final   Hgb A1c MFr Bld  Date Value Ref Range  Status  04/08/2017 7.9 (H) <5.7 % Final    Comment:      For someone without known diabetes, a hemoglobin A1c value of 6.5% or greater indicates that they may have diabetes and this should be confirmed with a follow-up test.   For someone with known diabetes, a value <7% indicates that their diabetes is well controlled and a value greater than or equal to 7% indicates suboptimal control. A1c targets should be individualized based on duration of diabetes, age, comorbid conditions, and other considerations.   Currently, no consensus exists for use of hemoglobin A1c for diagnosis of diabetes for children.            Failed - Valid encounter within last 6 months    Recent Outpatient Visits           7 months ago DM type 2 with diabetic peripheral neuropathy [facility])   [facility] [place], Coralie Keens, NP   10 months ago DM type 2 with diabetic peripheral neuropathy [facility])   [facility] [place], Coralie Keens, NP   1 year ago Chronic right hip pain   [facility] [place], Coralie Keens, NP   1 year ago DM type 2 with diabetic peripheral neuropathy [facility])   [facility], Coralie Keens, NP   1 year ago [place], FNP               Passed - Cr in normal range and within 360 days    Creat  Date Value Ref Range Status  01/14/2022 0.81 0.50 - 1.05 mg/dL Final   Creatinine, Urine  Date Value Ref Range Status  10/08/2021 49 20 - 275 mg/dL Final          gabapentin (NEURONTIN) 300 MG capsule [Pharmacy Med Name: Gabapentin 300 MG Oral Capsule] 180 capsule 0    Sig: Take 1 capsule by mouth twice daily     Neurology: Anticonvulsants - gabapentin Passed - 08/22/2022  1:34 PM      Passed - Cr in normal range and within 360 days    Creat  Date Value Ref Range Status  01/14/2022 0.81 0.50 - 1.05 mg/dL Final   Creatinine, Urine  Date Value Ref Range Status  10/08/2021 49 20 - 275 mg/dL Final         Passed - Completed PHQ-2 or PHQ-9 in the last 360 days      Passed - Valid encounter within last 12 months    Recent Outpatient Visits           7 months ago DM type 2 with diabetic peripheral neuropathy [facility])   [facility] [place], Coralie Keens, NP   10 months ago DM type 2 with diabetic peripheral neuropathy [facility])   [facility] [place], Coralie Keens, NP   1 year ago Chronic right hip pain   [facility] [place], [place] W, NP   1 year ago DM type 2 with diabetic peripheral neuropathy [facility])   [facility] [place], Coralie Keens, NP   1 year ago [facility] [place], Lupita Raider, FNP               fenofibrate 54 MG tablet [Pharmacy Med Name: Fenofibrate 54 MG Oral Tablet] 90 tablet 0    Sig: Take 1 tablet by mouth once daily     Cardiovascular:  Antilipid - Fibric Acid Derivatives Failed - 08/22/2022  1:34 PM      Failed - PLT in normal range and within 360 days    Platelets  Date Value Ref Range Status  01/14/2022 515 (H) 140 - 400 Thousand/uL Final  11/30/2015 428 (H) 150 - 379 x10E3/uL Final         Failed - WBC in normal range and within 360 days    WBC  Date Value Ref Range Status  01/14/2022 15.1 (H) 3.8 - 10.8 Thousand/uL Final          Failed - Lipid Panel in normal range within the last 12 months    Cholesterol, Total  Date Value Ref Range Status  11/30/2015 182 100 - 199 mg/dL Final   Cholesterol  Date Value Ref Range Status  01/14/2022 177 <200 mg/dL Final   LDL Cholesterol (Calc)  Date Value Ref Range Status  01/14/2022 96 mg/dL (calc) Final    Comment:    Reference range: <100 . Desirable range <100 mg/dL for primary prevention;   <70 mg/dL for patients with CHD or diabetic patients  with > or = 2 CHD risk factors. Marland Kitchen LDL-C is now calculated using the Martin-Hopkins  calculation, which is a validated novel method providing  better accuracy than the Friedewald equation in the  estimation of LDL-C.  Cresenciano Genre et al. Annamaria Helling. 4268;341(96): 2061-2068  (http://education.Quest[employer].com/faq/FAQ164)    HDL  Date Value Ref Range Status  01/14/2022 54 > OR = 50 mg/dL Final  11/30/2015 47 >39 mg/dL Final   Triglycerides  Date Value Ref Range Status  01/14/2022 174 (H) <150 mg/dL Final         Passed - ALT in normal range and within 360 days    ALT  Date Value Ref Range Status  01/14/2022 10 6 - 29 U/L Final         Passed - AST in normal range and within 360 days    AST  Date Value Ref Range Status  01/14/2022 10 10 - 35 U/L Final         Passed - Cr in normal range and within 360 days    Creat  Date Value Ref Range Status  01/14/2022 0.81 0.50 - 1.05 mg/dL Final   Creatinine, Urine  Date Value Ref Range Status  10/08/2021 49 20 - 275 mg/dL Final         Passed - HGB in normal range and within 360 days    Hemoglobin  Date Value Ref Range Status  01/14/2022 11.9 11.7 - 15.5 g/dL Final  11/30/2015 12.2 11.1 - 15.9 g/dL Final         Passed - HCT in normal range and within 360 days    HCT  Date Value Ref Range Status  01/14/2022 37.2 35.0 - 45.0 % Final   Hematocrit  Date Value Ref Range Status  11/30/2015 38.0 34.0 - 46.6 % Final         Passed - eGFR is 30 or above  and within 360 days    GFR, Est African American  Date Value Ref Range Status  03/22/2019 102 > OR = 60 mL/min/1.64m Final   GFR, Est Non African American  Date Value Ref Range Status  03/22/2019 88 > OR = 60 mL/min/1.746mFinal   GFR, Estimated  Date Value Ref Range Status  11/30/2021 >60 >60 mL/min Final    Comment:    (NOTE) Calculated using the CKD-EPI Creatinine Equation (2021)  eGFR  Date Value Ref Range Status  01/14/2022 81 > OR = 60 mL/min/1.31m Final    Comment:    The eGFR is based on the CKD-EPI 2021 equation. To calculate  the new eGFR from a previous Creatinine or Cystatin C result, go to https://www.kidney.org/professionals/ kdoqi/gfr%5Fcalculator          Passed - Valid encounter within last 12 months    Recent Outpatient Visits           7 months ago DM type 2 with diabetic peripheral neuropathy ([facility]   [facility]   10 months ago DM type 2 with diabetic peripheral neuropathy ([facility] [place]   [facility] RC[facility]ie Keens NP   1 year ago Chronic right hip pain   [facility] [place] NP   1 year ago DM type 2 with diabetic peripheral neuropathy ([facility]   [facility] RCoralie Keens NP   1 year ago DRoutt FNP               amLODipine (NMcIntosh 10 MG tablet [Pharmacy Med Name: amLODIPine Besylate 10 MG Oral Tablet] 90 tablet 0    Sig: Take 1 tablet by mouth once daily     Cardiovascular: Calcium Channel Blockers 2 Failed - 08/22/2022  1:34 PM      Failed - Valid encounter within last 6 months    Recent Outpatient Visits           7 months ago DM type 2 with diabetic peripheral neuropathy ([facility]   [facility] RCoralie Keens NP   10 months ago DM type 2 with diabetic peripheral neuropathy ([facility]   [facility]ie Keens NP   1 year ago Chronic right hip pain   [facility] R[place]W, NP   1 year ago DM type 2 with diabetic peripheral neuropathy ([facility]   [facility] RCoralie Keens NP   1 year ago DR[facility] Center[facility]alfi, NLupita Raider FNorth Zephyrh[place]             Passed - Last BP in normal range    BP Readings from Last 1 Encounters:  01/14/22 134/68         Passed - Last Heart Rate in normal range    Pulse Readings from Last 1 Encounters:  01/14/22 84          carvedilol (COREG) 25 MG tablet [Pharmacy Med Name: Carvedilol 25 MG Oral Tablet] 180 tablet 0    Sig: TAKE 1 TABLET BY MOUTH TWICE DAILY WITH MEALS     Cardiovascular: Beta Blockers 3 Failed - 08/22/2022  1:34 PM      Failed - Valid encounter within last 6 months    Recent Outpatient Visits           7 months ago DM type 2 with diabetic peripheral neuropathy ([facility]   [facility] RCoralie Keens NP   10 months ago DM type 2 with diabetic peripheral neuropathy ([facility]   [facility] RCoralie Keens NP   1 year ago Chronic right hip pain   [facility] RCoralie Keens NP   1 year ago DM type 2 with diabetic peripheral neuropathy ([facility]   [facility]BTul[facility] RPayson  NP   1 year ago [place], [place]              Passed - Cr in normal range and within 360 days    Creat  Date Value Ref Range Status  01/14/2022 0.81 0.50 - 1.05 mg/dL Final   Creatinine, Urine  Date Value Ref Range Status  10/08/2021 49 20 - 275 mg/dL Final         Passed - AST in normal range and within 360 days    AST  Date Value Ref Range Status  01/14/2022 10 10 - 35 U/L Final         Passed - ALT in normal range and within 360 days    ALT  Date Value Ref Range Status  01/14/2022 10 6 - 29 U/L Final         Passed - Last BP in normal range    BP Readings from Last 1 Encounters:  01/14/22 134/68         Passed - Last Heart Rate in normal range     Pulse Readings from Last 1 Encounters:  01/14/22 84          JARDIANCE 25 MG TABS tablet [Pharmacy Med Name: Jardiance 25 MG Oral Tablet] 90 tablet 0    Sig: Take 1 tablet by mouth once daily     Endocrinology:  Diabetes - SGLT2 Inhibitors Failed - 08/22/2022  1:34 PM      Failed - HBA1C is between 0 and 7.9 and within 180 days    Hemoglobin A1C  Date Value Ref Range Status  01/14/2022 6.6 (A) 4.0 - 5.6 % Final  01/06/2017 8.0  Final   Hgb A1c MFr Bld  Date Value Ref Range Status  04/08/2017 7.9 (H) <5.7 % Final    Comment:      For someone without known diabetes, a hemoglobin A1c value of 6.5% or greater indicates that they may have diabetes and this should be confirmed with a follow-up test.   For someone with known diabetes, a value <7% indicates that their diabetes is well controlled and a value greater than or equal to 7% indicates suboptimal control. A1c targets should be individualized based on duration of diabetes, age, comorbid conditions, and other considerations.   Currently, no consensus exists for use of hemoglobin A1c for diagnosis of diabetes for children.            Failed - Valid encounter within last 6 months    Recent Outpatient Visits           7 months ago DM type 2 with diabetic peripheral neuropathy [facility])   [facility] [place], Coralie Keens, NP   10 months ago DM type 2 with diabetic peripheral neuropathy [facility])   [facility], Coralie Keens, NP   1 year ago Chronic right hip pain   [facility] [place], [place], NP   1 year ago DM type 2 with diabetic peripheral neuropathy [facility])   [facility], Coralie Keens, NP   1 year ago [place], FNP              Passed - Cr in normal range and within 360 days    Creat  Date Value Ref Range Status  01/14/2022 0.81 0.50 - 1.05 mg/dL Final   Creatinine, Urine  Date  Value Ref Range Status   10/08/2021 49 20 - 275 mg/dL Final         Passed - eGFR in normal range and within 360 days    GFR, Est African American  Date Value Ref Range Status  03/22/2019 102 > OR = 60 mL/min/1.65m Final   GFR, Est Non African American  Date Value Ref Range Status  03/22/2019 88 > OR = 60 mL/min/1.770mFinal   GFR, Estimated  Date Value Ref Range Status  11/30/2021 >60 >60 mL/min Final    Comment:    (NOTE) Calculated using the CKD-EPI Creatinine Equation (2021)    eGFR  Date Value Ref Range Status  01/14/2022 81 > OR = 60 mL/min/1.7329minal    Comment:    The eGFR is based on the CKD-EPI 2021 equation. To calculate  the new eGFR from a previous Creatinine or Cystatin C result, go to https://www.kidney.org/professionals/ kdoqi/gfr%5Fcalculator           LEVEMIR FLEXPEN 100 UNIT/ML FlexPen [Pharmacy Med Name: Levemir FlexPen 100 UNIT/ML Subcutaneous Solution Pen-injector] 15 mL 0    Sig: INJECT 32 UNITS SUBCUTANEOUSLY AT BEDTIME. USE AS DIRECTED UP TO 45 UNITS PER DAY     Endocrinology:  Diabetes - Insulins Failed - 08/22/2022  1:34 PM      Failed - HBA1C is between 0 and 7.9 and within 180 days    Hemoglobin A1C  Date Value Ref Range Status  01/14/2022 6.6 (A) 4.0 - 5.6 % Final  01/06/2017 8.0  Final   Hgb A1c MFr Bld  Date Value Ref Range Status  04/08/2017 7.9 (H) <5.7 % Final    Comment:      For someone without known diabetes, a hemoglobin A1c value of 6.5% or greater indicates that they may have diabetes and this should be confirmed with a follow-up test.   For someone with known diabetes, a value <7% indicates that their diabetes is well controlled and a value greater than or equal to 7% indicates suboptimal control. A1c targets should be individualized based on duration of diabetes, age, comorbid conditions, and other considerations.   Currently, no consensus exists for use of hemoglobin A1c for diagnosis of diabetes for children.            Failed -  Valid encounter within last 6 months    Recent Outpatient Visits           7 months ago DM type 2 with diabetic peripheral neuropathy ([facility]iMin[facility] WellsegCoralie KeensP   10 months ago DM type 2 with diabetic peripheral neuropathy ([facility] SouKend[facility] Regional Medical CenteregCoralie KeensP   1 year ago Chronic right hip pain   SouA[facility]ast[facility] CenteriAcal[facility] RidgeegPennsylvania[place]P   1 year ago DM type 2 with diabetic peripheral neuropathy (HCO[facility] SouWoodlawn HospitalegCoralie KeensP   1 year ago DysL[facility] Medical Centerlfi, NicLupita RaiderNPNorth [place]ills[place]

## 2022-08-26 ENCOUNTER — Other Ambulatory Visit: Payer: Self-pay | Admitting: Internal Medicine

## 2022-08-26 DIAGNOSIS — I10 Essential (primary) hypertension: Secondary | ICD-10-CM

## 2022-08-27 ENCOUNTER — Other Ambulatory Visit: Payer: Self-pay | Admitting: Internal Medicine

## 2022-08-27 DIAGNOSIS — I10 Essential (primary) hypertension: Secondary | ICD-10-CM

## 2022-08-28 NOTE — Telephone Encounter (Signed)
Requested medication (s) are due for refill today: Yes  Requested medication (s) are on the active medication list: Yes  Last refill:  10/16/21  Future visit scheduled: Yes  Notes to clinic:  Unable to refill per protocol, appointment needed.      Requested Prescriptions  Pending Prescriptions Disp Refills   furosemide (LASIX) 40 MG tablet [Pharmacy Med Name: Furosemide 40 MG Oral Tablet] 90 tablet 0    Sig: Take 1 tablet by mouth once daily     Cardiovascular:  Diuretics - Loop Failed - 08/27/2022  9:18 AM      Failed - K in normal range and within 180 days    Potassium  Date Value Ref Range Status  01/14/2022 4.6 3.5 - 5.3 mmol/L Final         Failed - Ca in normal range and within 180 days    Calcium  Date Value Ref Range Status  01/14/2022 9.6 8.6 - 10.4 mg/dL Final         Failed - Na in normal range and within 180 days    Sodium  Date Value Ref Range Status  01/14/2022 138 135 - 146 mmol/L Final  11/30/2015 136 134 - 144 mmol/L Final         Failed - Cr in normal range and within 180 days    Creat  Date Value Ref Range Status  01/14/2022 0.81 0.50 - 1.05 mg/dL Final   Creatinine, Urine  Date Value Ref Range Status  10/08/2021 49 20 - 275 mg/dL Final         Failed - Cl in normal range and within 180 days    Chloride  Date Value Ref Range Status  01/14/2022 102 98 - 110 mmol/L Final         Failed - Mg Level in normal range and within 180 days    No results found for: "MG"       Failed - Valid encounter within last 6 months    Recent Outpatient Visits           7 months ago DM type 2 with diabetic peripheral neuropathy [facility])   [facility], NP   10 months ago DM type 2 with diabetic peripheral neuropathy [facility])   [facility] Jearld Fenton, NP   1 year ago Chronic right hip pain   [facility] [place], Coralie Keens, NP   1 year ago DM type 2 with diabetic peripheral neuropathy [facility])    [facility] [place], Coralie Keens, NP   1 year ago [place], FNP       Future Appointments             Tomorrow Garnette Gunner, Coralie Keens, NP [place] BP in normal range    BP Readings from Last 1 Encounters:  01/14/22 134/68

## 2022-08-29 ENCOUNTER — Encounter: Payer: Self-pay | Admitting: Internal Medicine

## 2022-08-29 ENCOUNTER — Other Ambulatory Visit (HOSPITAL_COMMUNITY)
Admission: RE | Admit: 2022-08-29 | Discharge: 2022-08-29 | Disposition: A | Discharge status: Home / Self Care | Payer: Medicare Other | Source: Ambulatory Visit | Attending: Internal Medicine | Admitting: Internal Medicine

## 2022-08-29 ENCOUNTER — Ambulatory Visit (INDEPENDENT_AMBULATORY_CARE_PROVIDER_SITE_OTHER): Payer: Medicare Other | Admitting: Internal Medicine

## 2022-08-29 VITALS — BP 119/48 | HR 76 | Temp 98.2°F | Resp 17 | Ht 62.0 in | Wt 237.6 lb

## 2022-08-29 DIAGNOSIS — Z23 Encounter for immunization: Secondary | ICD-10-CM | POA: Diagnosis not present

## 2022-08-29 DIAGNOSIS — E1142 Type 2 diabetes mellitus with diabetic polyneuropathy: Secondary | ICD-10-CM | POA: Diagnosis not present

## 2022-08-29 DIAGNOSIS — D75839 Thrombocytosis, unspecified: Secondary | ICD-10-CM

## 2022-08-29 DIAGNOSIS — D500 Iron deficiency anemia secondary to blood loss (chronic): Secondary | ICD-10-CM

## 2022-08-29 DIAGNOSIS — N898 Other specified noninflammatory disorders of vagina: Secondary | ICD-10-CM | POA: Diagnosis present

## 2022-08-29 DIAGNOSIS — E1169 Type 2 diabetes mellitus with other specified complication: Secondary | ICD-10-CM

## 2022-08-29 DIAGNOSIS — I10 Essential (primary) hypertension: Secondary | ICD-10-CM

## 2022-08-29 DIAGNOSIS — G894 Chronic pain syndrome: Secondary | ICD-10-CM

## 2022-08-29 DIAGNOSIS — M1990 Unspecified osteoarthritis, unspecified site: Secondary | ICD-10-CM

## 2022-08-29 DIAGNOSIS — I429 Cardiomyopathy, unspecified: Secondary | ICD-10-CM

## 2022-08-29 DIAGNOSIS — Z6841 Body Mass Index (BMI) 40.0 and over, adult: Secondary | ICD-10-CM

## 2022-08-29 DIAGNOSIS — E785 Hyperlipidemia, unspecified: Secondary | ICD-10-CM

## 2022-08-29 NOTE — Assessment & Plan Note (Signed)
Encourage diet and exercise for weight loss 

## 2022-08-29 NOTE — Assessment & Plan Note (Signed)
Controlled on amlodipine, enalapril, carvedilol and furosemide Encourage DASH diet and exercise weight loss C-Met today

## 2022-08-29 NOTE — Assessment & Plan Note (Signed)
Continue gabapentin and Zanaflex

## 2022-08-29 NOTE — Assessment & Plan Note (Signed)
C-Met and lipid profile today Encouraged her to consume low-fat diet Continue rosuvastatin fenofibrate

## 2022-08-29 NOTE — Assessment & Plan Note (Signed)
A1c and urine microalbumin today Encouraged consume a low carb diet and exercise for weight loss Continue Jardiance, Januvia, Levemir and gabapentin Will request copy of eye exam Encouraged routine foot exam Flu shot today Pneumonia vaccine UTD Encouraged her to get her COVID booster

## 2022-08-29 NOTE — Assessment & Plan Note (Signed)
CBC with differential today

## 2022-08-29 NOTE — Progress Notes (Signed)
Subjective:    Patient ID: Stacy Martinez, female    DOB: 1956-05-18, 66 y.o.   MRN: [id number]  HPI  Patient presents to clinic today for follow-up of chronic conditions.  HTN with Cardiomyopathy: Her BP today is 119/48.  She is taking Amlodipine, Enalapril, Carvedilol and Furosemide as prescribed.  ECG from 11/2021 reviewed.  HLD: Her last LDL was 96, triglycerides 174, 12/2021.  She denies myalgias on Rosuvastatin and Fenofibrate.  She tries to consume a low-fat diet.  DM2 with Peripheral Neuropathy: Her last A1c was 6.6%, 12/2021.  She is taking Jardiance, Januvia and Levemir as prescribed.  Her sugars range 140-210.  She takes Gabapentin for neuropathic pain.  Her last eye exam was 11/2021.  Flu 06/2021.  Prevnar 06/2021.  Pneumovax 06/2019.  COVID never.  OA/Chronic Pain: Mainly in her right shoulder, back, hips and knees.  She is taking Gabapentin and Zanaflex as prescribed with some relief of symptoms.  She follows with pain management.  Thrombocytosis: Her last platelet count was 515, 12/2021.  She does not follow with hematology.  She also reports vaginal itching and discharge.  She reports this started 1 week ago.  The discharge is white.  She has tried Monistat OTC with minimal improvement in symptoms.  Review of Systems     Past Medical History:  Diagnosis Date   Allergy    Arthritis    Diabetes mellitus without complication (HCC)    Enlarged heart    Hypertension     Current Outpatient Medications  Medication Sig Dispense Refill   acetaminophen (TYLENOL) 500 MG tablet Take 1-2 tablets (500-1,000 mg total) by mouth every 6 (six) hours as needed. 60 tablet 0   amLODipine (NORVASC) 10 MG tablet Take 1 tablet by mouth once daily 30 tablet 0   carvedilol (COREG) 25 MG tablet TAKE 1 TABLET BY MOUTH TWICE DAILY WITH MEALS 60 tablet 0   enalapril (VASOTEC) 20 MG tablet Take 1 tablet by mouth twice daily 180 tablet 0   fenofibrate 54 MG tablet Take 1 tablet (54 mg total) by mouth  daily. 90 tablet 1   furosemide (LASIX) 40 MG tablet Take 1 tablet (40 mg total) by mouth daily. 90 tablet 1   gabapentin (NEURONTIN) 300 MG capsule Take 1 capsule (300 mg total) by mouth 2 (two) times daily. 180 capsule 1   glucose blood (RELION GLUCOSE TEST STRIPS) test strip Use as instructed 100 each 12   insulin detemir (LEVEMIR FLEXTOUCH) 100 UNIT/ML FlexPen Inject 32 Units into the skin at bedtime. As directed up to 45 units per day. 15 mL 0   JARDIANCE 25 MG TABS tablet Take 1 tablet by mouth once daily 90 tablet 0   ondansetron (ZOFRAN) 4 MG tablet Take 1 tablet (4 mg total) by mouth every 6 (six) hours as needed for nausea. 30 tablet 0   oxyCODONE (OXY IR/ROXICODONE) 5 MG immediate release tablet Take 1-2 tablets (5-10 mg total) by mouth every 4 (four) hours as needed for severe pain (pain score 4-6). 60 tablet 0   ReliOn Ultra Thin Lancets MISC 1 each by Does not apply route daily. 100 each 11   rosuvastatin (CRESTOR) 20 MG tablet Take 1 tablet (20 mg total) by mouth daily. (Patient taking differently: Take 20 mg by mouth at bedtime.) 90 tablet 1   sitaGLIPtin (JANUVIA) 100 MG tablet Take 1 tablet by mouth once daily 90 tablet 0   tiZANidine (ZANAFLEX) 4 MG tablet Take 1 tablet (4  mg total) by mouth every 8 (eight) hours as needed for muscle spasms. 30 tablet 0   traMADol (ULTRAM) 50 MG tablet Take 1 tablet (50 mg total) by mouth every 6 (six) hours. 30 tablet 0   ULTICARE MINI PEN NEEDLES 32G X 6 MM MISC 1 Bottle by Does not apply route as directed. 100 each 1   No current facility-administered medications for this visit.    Allergies  Allergen Reactions   Prednisone Swelling   Metformin And Related     Severe nausea and diarrhea even with extended release versions    Family History  Problem Relation Age of Onset   Diabetes Mother    Cancer Father    Mental illness Son     Social History   Socioeconomic History   Marital status: Divorced    Spouse name: Not on file    Number of children: 4   Years of education: Not on file   Highest education level: Not on file  Occupational History   Not on file  Tobacco Use   Smoking status: Never   Smokeless tobacco: Never  Vaping Use   Vaping Use: Never used  Substance and Sexual Activity   Alcohol use: No    Alcohol/week: 0.0 standard drinks of alcohol   Drug use: No   Sexual activity: Not on file  Other Topics Concern   Not on file  Social History Narrative   Not on file   Social Determinants of Health   Financial Resource Strain: Not on file  Food Insecurity: Not on file  Transportation Needs: Not on file  Physical Activity: Not on file  Stress: Not on file  Social Connections: Not on file  Intimate Partner Violence: Not on file     Constitutional: Denies fever, malaise, fatigue, headache or abrupt weight changes.  HEENT: Denies eye pain, eye redness, ear pain, ringing in the ears, wax buildup, runny nose, nasal congestion, bloody nose, or sore throat. Respiratory: Denies difficulty breathing, shortness of breath, cough or sputum production.   Cardiovascular: Denies chest pain, chest tightness, palpitations or swelling in the hands or feet.  Gastrointestinal: Denies abdominal pain, bloating, constipation, diarrhea or blood in the stool.  GU: Patient reports vaginal itching and discharge.  Denies urgency, frequency, pain with urination, burning sensation, blood in urine, odor. Musculoskeletal: Patient reports chronic joint muscle pain.  Denies decrease in range of motion, difficulty with gait, or joint swelling.  Skin: Denies redness, rashes, lesions or ulcercations.  Neurological: Patient reports neuropathic pain.  Denies dizziness, difficulty with memory, difficulty with speech or problems with balance and coordination.  Psych: Denies anxiety, depression, SI/HI.  No other specific complaints in a complete review of systems (except as listed in HPI above).  Objective:   Physical Exam   BP  (!) 119/48 (BP Location: Right Arm, Patient Position: Sitting, Cuff Size: Large)   Pulse 76   Temp 98.2 F (36.8 C) (Oral)   Resp 17   Ht '5\' 2"'$  (1.575 m)   Wt 237 lb 9.6 oz (107.8 kg)   SpO2 98%   BMI 43.46 kg/m   Wt Readings from Last 3 Encounters:  01/14/22 241 lb (109.3 kg)  11/29/21 240 lb (108.9 kg)  11/15/21 244 lb 3.2 oz (110.8 kg)    General: Appears her stated age, obese, in NAD. Skin: Warm, dry and intact. No ulcerations noted. HEENT: Head: normal shape and size; Eyes: sclera white, no icterus, conjunctiva pink, PERRLA and EOMs intact;  Cardiovascular:  Normal rate and rhythm. S1,S2 noted.  No murmur, rubs or gallops noted. No JVD or BLE edema. No carotid bruits noted. Pulmonary/Chest: Normal effort and positive vesicular breath sounds. No respiratory distress. No wheezes, rales or ronchi noted.  Pelvic: Self swab. Musculoskeletal: Joint deformity noted in hands.  No difficulty with gait.  Neurological: Alert and oriented. Coordination normal.  Psychiatric: Mood and affect normal. Behavior is normal. Judgment and thought content normal.     BMET    Component Value Date/Time   NA 138 01/14/2022 1552   NA 136 11/30/2015 1037   K 4.6 01/14/2022 1552   CL 102 01/14/2022 1552   CO2 25 01/14/2022 1552   GLUCOSE 128 01/14/2022 1552   BUN 11 01/14/2022 1552   BUN 19 11/30/2015 1037   CREATININE 0.81 01/14/2022 1552   CALCIUM 9.6 01/14/2022 1552   GFRNONAA >60 11/30/2021 0615   GFRNONAA 88 03/22/2019 0909   GFRAA 102 03/22/2019 0909    Lipid Panel     Component Value Date/Time   CHOL 177 01/14/2022 1552   CHOL 182 11/30/2015 1037   TRIG 174 (H) 01/14/2022 1552   HDL 54 01/14/2022 1552   HDL 47 11/30/2015 1037   CHOLHDL 3.3 01/14/2022 1552   VLDL 33 (H) 04/08/2017 0857   LDLCALC 96 01/14/2022 1552    CBC    Component Value Date/Time   WBC 15.1 (H) 01/14/2022 1552   RBC 4.74 01/14/2022 1552   HGB 11.9 01/14/2022 1552   HGB 12.2 11/30/2015 1037   HCT  37.2 01/14/2022 1552   HCT 38.0 11/30/2015 1037   PLT 515 (H) 01/14/2022 1552   PLT 428 (H) 11/30/2015 1037   MCV 78.5 (L) 01/14/2022 1552   MCV 83 11/30/2015 1037   MCH 25.1 (L) 01/14/2022 1552   MCHC 32.0 01/14/2022 1552   RDW 14.5 01/14/2022 1552   RDW 15.1 11/30/2015 1037   LYMPHSABS 2,597 01/14/2022 1552   LYMPHSABS 3.1 11/30/2015 1037   MONOABS 0.7 11/15/2021 1110   EOSABS 196 01/14/2022 1552   EOSABS 0.4 11/30/2015 1037   BASOSABS 76 01/14/2022 1552   BASOSABS 0.1 11/30/2015 1037    Hgb A1C Lab Results  Component Value Date   HGBA1C 6.6 (A) 01/14/2022           Assessment & Plan:   Vaginal Itching and Discharge:  Wet prep obtained to check for BV and yeast  RTC in 6 months, follow-up chronic conditions Webb Silversmith, NP

## 2022-08-29 NOTE — Patient Instructions (Signed)
Vaginitis  Vaginitis is irritation and swelling of the vagina. Treatment will depend on the cause. What are the causes? It can be caused by: Bacteria. Yeast. A parasite. A virus. Low hormone levels. Bubble baths, scented tampons, and feminine sprays. Other things can change the balance of the yeast and bacteria that live in the vagina. These include: Antibiotic medicines. Not being clean enough. Some birth control methods. Sex. Infection. Diabetes. A weakened body defense system (immune system). What increases the risk? Smoking or being around someone who smokes. Using washes (douches), scented tampons, or scented pads. Wearing tight pants or thong underwear. Using birth control pills or an IUD. Having sex without a condom or having a lot of partners. Having an STI. Using a certain product to kill sperm (nonoxynol-9). Eating foods that are high in sugar. Having diabetes. Having low levels of a female hormone. Having a weakened body defense system. Being pregnant or breastfeeding. What are the signs or symptoms? Fluid coming from the vagina that is not normal. A bad smell. Itching, pain, or swelling. Pain with sex. Pain or burning when you pee (urinate). Sometimes there are no symptoms. How is this treated? Treatment may include: Antibiotic creams or pills. Antifungal medicines. Medicines to ease symptoms if you have a virus. Your sex partner should also be treated. Estrogen medicines. Avoiding scented soaps, sprays, or douches. Stopping use of products that caused irritation and then using a cream to treat symptoms. Follow these instructions at home: Lifestyle Keep the area around your vagina clean and dry. Avoid using soap. Rinse the area with water. Until your doctor says it is okay: Do not use washes for the vagina. Do not use tampons. Do not have sex. Wipe from front to back after going to the bathroom. When your doctor says it is okay, practice safe sex  and use condoms. General instructions Take over-the-counter and prescription medicines only as told by your doctor. If you were prescribed an antibiotic medicine, take or use it as told by your doctor. Do not stop taking or using it even if you start to feel better. Keep all follow-up visits. How is this prevented? Do not use things that can irritate the vagina, such as fabric softeners. Avoid these products if they are scented: Sprays. Detergents. Tampons. Products for cleaning the vagina. Soaps or bubble baths. Let air reach your vagina. To do this: Wear cotton underwear. Do not wear: Underwear while you sleep. Tight pants. Thong underwear. Underwear or nylons without a cotton panel. Take off any wet clothing, such as bathing suits, as soon as you can. Practice safe sex and use condoms. Contact a doctor if: You have pain in your belly or in the area between your hips. You have a fever or chills. Your symptoms last for more than 2-3 days. Get help right away if: You have a fever and your symptoms get worse all of a sudden. Summary Vaginitis is irritation and swelling of the vagina. Treatment will depend on the cause of the condition. Do not use washes or tampons or have sex until your doctor says it is okay. This information is not intended to replace advice given to you by your health care provider. Make sure you discuss any questions you have with your health care provider. Document Revised: 02/16/2020 Document Reviewed: 02/16/2020 Elsevier Patient Education  [place].

## 2022-08-30 LAB — CBC WITH DIFFERENTIAL/PLATELET
Absolute Monocytes: 595 cells/uL (ref 200–950)
Basophils Absolute: 77 cells/uL (ref 0–200)
Basophils Relative: 0.9 %
Eosinophils Absolute: 179 cells/uL (ref 15–500)
Eosinophils Relative: 2.1 %
HCT: 38.3 % (ref 35.0–45.0)
Hemoglobin: 12.5 g/dL (ref 11.7–15.5)
Lymphs Abs: 2278 cells/uL (ref 850–3900)
MCH: 25.4 pg — ABNORMAL LOW (ref 27.0–33.0)
MCHC: 32.6 g/dL (ref 32.0–36.0)
MCV: 77.8 fL — ABNORMAL LOW (ref 80.0–100.0)
MPV: 10.7 fL (ref 7.5–12.5)
Monocytes Relative: 7 %
Neutro Abs: 5372 cells/uL (ref 1500–7800)
Neutrophils Relative %: 63.2 %
Platelets: 454 10*3/uL — ABNORMAL HIGH (ref 140–400)
RBC: 4.92 10*6/uL (ref 3.80–5.10)
RDW: 15.1 % — ABNORMAL HIGH (ref 11.0–15.0)
Total Lymphocyte: 26.8 %
WBC: 8.5 10*3/uL (ref 3.8–10.8)

## 2022-08-30 LAB — COMPLETE METABOLIC PANEL WITH GFR
AG Ratio: 1.1 (calc) (ref 1.0–2.5)
ALT: 14 U/L (ref 6–29)
AST: 14 U/L (ref 10–35)
Albumin: 4.2 g/dL (ref 3.6–5.1)
Alkaline phosphatase (APISO): 118 U/L (ref 37–153)
BUN: 18 mg/dL (ref 7–25)
CO2: 24 mmol/L (ref 20–32)
Calcium: 9.6 mg/dL (ref 8.6–10.4)
Chloride: 104 mmol/L (ref 98–110)
Creat: 0.78 mg/dL (ref 0.50–1.05)
Globulin: 3.7 g/dL (calc) (ref 1.9–3.7)
Glucose, Bld: 235 mg/dL — ABNORMAL HIGH (ref 65–99)
Potassium: 5 mmol/L (ref 3.5–5.3)
Sodium: 138 mmol/L (ref 135–146)
Total Bilirubin: 0.4 mg/dL (ref 0.2–1.2)
Total Protein: 7.9 g/dL (ref 6.1–8.1)
eGFR: 84 mL/min/{1.73_m2} (ref >=60)

## 2022-08-30 LAB — LIPID PANEL
Cholesterol: 204 mg/dL — ABNORMAL HIGH (ref <200)
HDL: 53 mg/dL (ref >=50)
LDL Cholesterol (Calc): 117 mg/dL (calc) — ABNORMAL HIGH
Non-HDL Cholesterol (Calc): 151 mg/dL (calc) — ABNORMAL HIGH (ref <130)
Total CHOL/HDL Ratio: 3.8 (calc) (ref <5.0)
Triglycerides: 225 mg/dL — ABNORMAL HIGH (ref <150)

## 2022-08-30 LAB — MICROALBUMIN / CREATININE URINE RATIO
Creatinine, Urine: 35 mg/dL (ref 20–275)
Microalb Creat Ratio: 9 mcg/mg creat (ref <30)
Microalb, Ur: 0.3 mg/dL

## 2022-08-30 LAB — HEMOGLOBIN A1C
Hgb A1c MFr Bld: 8.5 % of total Hgb — ABNORMAL HIGH (ref <5.7)
Mean Plasma Glucose: 197 mg/dL
eAG (mmol/L): 10.9 mmol/L

## 2022-09-03 LAB — CERVICOVAGINAL ANCILLARY ONLY
Bacterial Vaginitis (gardnerella): NEGATIVE
Candida Glabrata: POSITIVE — AB
Candida Vaginitis: POSITIVE — AB
Comment: NEGATIVE
Comment: NEGATIVE
Comment: NEGATIVE

## 2022-09-03 MED ORDER — FLUCONAZOLE 150 MG PO TABS: 150.0000 mg | ORAL_TABLET | Freq: Once | ORAL | 0 refills | Status: AC

## 2022-09-03 MED ORDER — METRONIDAZOLE 0.75 % VA GEL: 1.0000 | Freq: Two times a day (BID) | VAGINAL | 0 refills | Status: DC

## 2022-09-03 NOTE — Addendum Note (Signed)
Addended by: Jearld Fenton on: 09/03/2022 06:55 PM   Modules accepted: Orders

## 2022-09-05 ENCOUNTER — Telehealth: Payer: Self-pay | Admitting: Internal Medicine

## 2022-09-05 NOTE — Telephone Encounter (Signed)
Left message for patient to call back and schedule Medicare Annual Wellness Visit (AWV) to be done virtually or by telephone.  No hx of AWV eligible as of 03/01/22  Please schedule at anytime with [facility].       Any questions, please call me at [phone]

## 2022-09-10 ENCOUNTER — Other Ambulatory Visit: Payer: Self-pay | Admitting: Internal Medicine

## 2022-09-10 DIAGNOSIS — E1142 Type 2 diabetes mellitus with diabetic polyneuropathy: Secondary | ICD-10-CM

## 2022-09-10 NOTE — Telephone Encounter (Signed)
Requested Prescriptions  Pending Prescriptions Disp Refills   LEVEMIR FLEXPEN 100 UNIT/ML FlexPen [Pharmacy Med Name: Levemir FlexPen 100 UNIT/ML Subcutaneous Solution Pen-injector] 15 mL 0    Sig: INJECT 32 UNITS SUBCUTANEOUSLY AT BEDTIME. USE AS DIRECTED UP TO 45 UNITS PER DAY     Endocrinology:  Diabetes - Insulins Failed - 09/10/2022 11:08 AM      Failed - HBA1C is between 0 and 7.9 and within 180 days    Hemoglobin A1C  Date Value Ref Range Status  01/06/2017 8.0  Final   Hgb A1c MFr Bld  Date Value Ref Range Status  08/29/2022 8.5 (H) <5.7 % of total Hgb Final    Comment:    For someone without known diabetes, a hemoglobin A1c value of 6.5% or greater indicates that they may have  diabetes and this should be confirmed with a follow-up  test. . For someone with known diabetes, a value <7% indicates  that their diabetes is well controlled and a value  greater than or equal to 7% indicates suboptimal  control. A1c targets should be individualized based on  duration of diabetes, age, comorbid conditions, and  other considerations. . Currently, no consensus exists regarding use of hemoglobin A1c for diagnosis of diabetes for children. Renella Cunas - Valid encounter within last 6 months    Recent Outpatient Visits           1 week ago DM type 2 with diabetic peripheral neuropathy ([place])   [facility] [place], Coralie Keens, NP   7 months ago DM type 2 with diabetic peripheral neuropathy [facility])   [facility], Coralie Keens, NP   11 months ago DM type 2 with diabetic peripheral neuropathy [facility])   [facility] [place], Coralie Keens, NP   1 year ago Chronic right hip pain   [facility] [place], Coralie Keens, NP   1 year ago DM type 2 with diabetic peripheral neuropathy [facility])   [facility] [place], Coralie Keens, NP               sitaGLIPtin (JANUVIA) 100 MG tablet [Pharmacy Med Name: Januvia 100 MG Oral Tablet]  90 tablet 0    Sig: Take 1 tablet by mouth once daily     Endocrinology:  Diabetes - DPP-4 Inhibitors Failed - 09/10/2022 11:08 AM      Failed - HBA1C is between 0 and 7.9 and within 180 days    Hemoglobin A1C  Date Value Ref Range Status  01/06/2017 8.0  Final   Hgb A1c MFr Bld  Date Value Ref Range Status  08/29/2022 8.5 (H) <5.7 % of total Hgb Final    Comment:    For someone without known diabetes, a hemoglobin A1c value of 6.5% or greater indicates that they may have  diabetes and this should be confirmed with a follow-up  test. . For someone with known diabetes, a value <7% indicates  that their diabetes is well controlled and a value  greater than or equal to 7% indicates suboptimal  control. A1c targets should be individualized based on  duration of diabetes, age, comorbid conditions, and  other considerations. . Currently, no consensus exists regarding use of hemoglobin A1c for diagnosis of diabetes for children. .          Passed - Cr in normal range and within 360 days    Creat  Date  Value Ref Range Status  08/29/2022 0.78 0.50 - 1.05 mg/dL Final   Creatinine, Urine  Date Value Ref Range Status  08/29/2022 35 20 - 275 mg/dL Final         Passed - Valid encounter within last 6 months    Recent Outpatient Visits           1 week ago DM type 2 with diabetic peripheral neuropathy [facility])   [facility], Coralie Keens, NP   7 months ago DM type 2 with diabetic peripheral neuropathy [facility])   [facility], Coralie Keens, NP   11 months ago DM type 2 with diabetic peripheral neuropathy [facility])   [facility] [place], Coralie Keens, NP   1 year ago Chronic right hip pain   [facility] [place], Coralie Keens, NP   1 year ago DM type 2 with diabetic peripheral neuropathy [facility])   [facility], Coralie Keens, NP               JARDIANCE 25 MG TABS tablet [Pharmacy Med Name: Jardiance 25 MG Oral  Tablet] 90 tablet 0    Sig: Take 1 tablet by mouth once daily     Endocrinology:  Diabetes - SGLT2 Inhibitors Failed - 09/10/2022 11:08 AM      Failed - HBA1C is between 0 and 7.9 and within 180 days    Hemoglobin A1C  Date Value Ref Range Status  01/06/2017 8.0  Final   Hgb A1c MFr Bld  Date Value Ref Range Status  08/29/2022 8.5 (H) <5.7 % of total Hgb Final    Comment:    For someone without known diabetes, a hemoglobin A1c value of 6.5% or greater indicates that they may have  diabetes and this should be confirmed with a follow-up  test. . For someone with known diabetes, a value <7% indicates  that their diabetes is well controlled and a value  greater than or equal to 7% indicates suboptimal  control. A1c targets should be individualized based on  duration of diabetes, age, comorbid conditions, and  other considerations. . Currently, no consensus exists regarding use of hemoglobin A1c for diagnosis of diabetes for children. .          Passed - Cr in normal range and within 360 days    Creat  Date Value Ref Range Status  08/29/2022 0.78 0.50 - 1.05 mg/dL Final   Creatinine, Urine  Date Value Ref Range Status  08/29/2022 35 20 - 275 mg/dL Final         Passed - eGFR in normal range and within 360 days    GFR, Est African American  Date Value Ref Range Status  03/22/2019 102 > OR = 60 mL/min/1.8m Final   GFR, Est Non African American  Date Value Ref Range Status  03/22/2019 88 > OR = 60 mL/min/1.737mFinal   GFR, Estimated  Date Value Ref Range Status  11/30/2021 >60 >60 mL/min Final    Comment:    (NOTE) Calculated using the CKD-EPI Creatinine Equation (2021)    eGFR  Date Value Ref Range Status  08/29/2022 84 > OR = 60 mL/min/1.7363minal         Passed - Valid encounter within last 6 months    Recent Outpatient Visits           1 week ago DM type 2 with diabetic peripheral neuropathy [place] [place]    7 months ago DM type 2 with diabetic peripheral neuropathy [facility])   [facility] [place], Coralie Keens, NP   11 months ago DM type 2 with diabetic peripheral neuropathy [facility])   [facility] [place], Coralie Keens, NP   1 year ago Chronic right hip pain   [facility] [place], [place], [place]   1 year ago DM type 2 with diabetic peripheral neuropathy [facility])   [facility], Coralie Keens, NP               gabapentin (NEURONTIN) 300 MG capsule [Pharmacy Med Name: Gabapentin 300 MG Oral Capsule] 180 capsule 0    Sig: Take 1 capsule by mouth twice daily     Neurology: Anticonvulsants - gabapentin Passed - 09/10/2022 11:08 AM      Passed - Cr in normal range and within 360 days    Creat  Date Value Ref Range Status  08/29/2022 0.78 0.50 - 1.05 mg/dL Final   Creatinine, Urine  Date Value Ref Range Status  08/29/2022 35 20 - 275 mg/dL Final         Passed - Completed PHQ-2 or PHQ-9 in the last 360 days      Passed - Valid encounter within last 12 months    Recent Outpatient Visits           1 week ago DM type 2 with diabetic peripheral neuropathy [facility])   [facility] [place], Coralie Keens, NP   7 months ago DM type 2 with diabetic peripheral neuropathy [facility])   [facility] [place], Coralie Keens, NP   11 months ago DM type 2 with diabetic peripheral neuropathy [facility])   [facility] [place], Coralie Keens, NP   1 year ago Chronic right hip pain   [facility] [place], Coralie Keens, NP   1 year ago DM type 2 with diabetic peripheral neuropathy [facility])   [facility] [place], Coralie Keens, NP

## 2022-09-16 ENCOUNTER — Encounter: Payer: Self-pay | Admitting: Internal Medicine

## 2022-10-02 ENCOUNTER — Encounter: Payer: Self-pay | Admitting: Student in an Organized Health Care Education/Training Program

## 2022-10-02 ENCOUNTER — Ambulatory Visit
Payer: Medicare Other | Attending: Student in an Organized Health Care Education/Training Program | Admitting: Student in an Organized Health Care Education/Training Program

## 2022-10-02 VITALS — BP 153/64 | HR 79 | Temp 98.2°F | Resp 18 | Ht 62.0 in | Wt 240.0 lb

## 2022-10-02 DIAGNOSIS — M47816 Spondylosis without myelopathy or radiculopathy, lumbar region: Secondary | ICD-10-CM | POA: Diagnosis not present

## 2022-10-02 DIAGNOSIS — M170 Bilateral primary osteoarthritis of knee: Secondary | ICD-10-CM | POA: Diagnosis not present

## 2022-10-02 DIAGNOSIS — G894 Chronic pain syndrome: Secondary | ICD-10-CM | POA: Diagnosis not present

## 2022-10-02 MED ORDER — DICLOFENAC SODIUM 50 MG PO TBEC: 50.0000 mg | DELAYED_RELEASE_TABLET | Freq: Two times a day (BID) | ORAL | 0 refills | Status: DC

## 2022-10-02 MED ORDER — TIZANIDINE HCL 4 MG PO TABS: 4.0000 mg | ORAL_TABLET | Freq: Three times a day (TID) | ORAL | 0 refills | Status: DC | PRN

## 2022-10-02 MED ORDER — TRAMADOL HCL 50 MG PO TABS: 50.0000 mg | ORAL_TABLET | Freq: Four times a day (QID) | ORAL | 1 refills | Status: AC | PRN

## 2022-10-02 NOTE — Patient Instructions (Addendum)
______________________________________________________________________  Preparing for your procedure  During your procedure appointment there will be: No Prescription Refills. No disability issues to discussed. No medication changes or discussions.  Instructions: Food intake: Avoid eating anything solid for at least 8 hours prior to your procedure. Clear liquid intake: You may take clear liquids such as water up to 2 hours prior to your procedure. (No carbonated drinks. No soda.) Transportation: Unless otherwise stated by your physician, bring a driver. Morning Medicines: Except for blood thinners, take all of your other morning medications with a sip of water. Make sure to take your heart and blood pressure medicines. If your blood pressure's lower number is above 100, the case will be rescheduled. Blood thinners: Make sure to stop your blood thinners as instructed.  If you take a blood thinner, but were not instructed to stop it, call our office [phone] and ask to talk to a nurse. Not stopping a blood thinner prior to certain procedures could lead to serious complications. Diabetics on insulin: Notify the staff so that you can be scheduled 1st case in the morning. If your diabetes requires high dose insulin, take only  of your normal insulin dose the morning of the procedure and notify the staff that you have done so. Preventing infections: Shower with an antibacterial soap the morning of your procedure.  Build-up your immune system: Take 1000 mg of Vitamin C with every meal (3 times a day) the day prior to your procedure. Antibiotics: Inform the nursing staff if you are taking any antibiotics or if you have any conditions that may require antibiotics prior to procedures. (Example: recent joint implants)   Pregnancy: If you are pregnant make sure to notify the nursing staff. Not doing so may result in injury to the fetus, including death.  Sickness: If you have a cold, fever, or any  active infections, call and cancel or reschedule your procedure. Receiving steroids while having an infection may result in complications. Arrival: You must be in the facility at least 30 minutes prior to your scheduled procedure. Tardiness: Your scheduled time is also the cutoff time. If you do not arrive at least 15 minutes prior to your procedure, you will be rescheduled.  Children: Do not bring any children with you. Make arrangements to keep them home. Dress appropriately: There is always a possibility that your clothing may get soiled. Avoid long dresses. Valuables: Do not bring any jewelry or valuables.  Reasons to call and reschedule or cancel your procedure: (Following these recommendations will minimize the risk of a serious complication.) Surgeries: Avoid having procedures within 2 weeks of any surgery. (Avoid for 2 weeks before or after any surgery). Flu Shots: Avoid having procedures within 2 weeks of a flu shots or . (Avoid for 2 weeks before or after immunizations). Barium: Avoid having a procedure within 7-10 days after having had a radiological study involving the use of radiological contrast. (Myelograms, Barium swallow or enema study). Heart attacks: Avoid any elective procedures or surgeries for the initial 6 months after a "Myocardial Infarction" (Heart Attack). Blood thinners: It is imperative that you stop these medications before procedures. Let us know if you if you take any blood thinner.  Infection: Avoid procedures during or within two weeks of an infection (including chest colds or gastrointestinal problems). Symptoms associated with infections include: Localized redness, fever, chills, night sweats or profuse sweating, burning sensation when voiding, cough, congestion, stuffiness, runny nose, sore throat, diarrhea, nausea, vomiting, cold or Flu symptoms, recent or   current infections. It is specially important if the infection is over the area that we intend to treat. Heart  and lung problems: Symptoms that may suggest an active cardiopulmonary problem include: cough, chest pain, breathing difficulties or shortness of breath, dizziness, ankle swelling, uncontrolled high or unusually low blood pressure, and/or palpitations. If you are experiencing any of these symptoms, cancel your procedure and contact your primary care physician for an evaluation.  Remember:  Regular Business hours are:  Monday to Thursday 8:00 AM to 4:00 PM  Provider's Schedule: Milinda Pointer, MD:  Procedure days: Tuesday and Thursday 7:30 AM to 4:00 PM  Gillis Santa, MD:  Procedure days: Monday and Wednesday 7:30 AM to 4:00 PM  Do not take your Jardiance and Januvia on morning of the procedure. ______________________________________________________________________    ____________________________________________________________________________________________  General Risks and Possible Complications  Patient Responsibilities: It is important that you read this as it is part of your informed consent. It is our duty to inform you of the risks and possible complications associated with treatments offered to you. It is your responsibility as a patient to read this and to ask questions about anything that is not clear or that you believe was not covered in this document.  Patient's Rights: You have the right to refuse treatment. You also have the right to change your mind, even after initially having agreed to have the treatment done. However, under this last option, if you wait until the last second to change your mind, you may be charged for the materials used up to that point.  Introduction: Medicine is not an Chief Strategy Officer. Everything in Medicine, including the lack of treatment(s), carries the potential for danger, harm, or loss (which is by definition: Risk). In Medicine, a complication is a secondary problem, condition, or disease that can aggravate an already existing one. All  treatments carry the risk of possible complications. The fact that a side effects or complications occurs, does not imply that the treatment was conducted incorrectly. It must be clearly understood that these can happen even when everything is done following the highest safety standards.  No treatment: You can choose not to proceed with the proposed treatment alternative. The "PRO(s)" would include: avoiding the risk of complications associated with the therapy. The "CON(s)" would include: not getting any of the treatment benefits. These benefits fall under one of three categories: diagnostic; therapeutic; and/or palliative. Diagnostic benefits include: getting information which can ultimately lead to improvement of the disease or symptom(s). Therapeutic benefits are those associated with the successful treatment of the disease. Finally, palliative benefits are those related to the decrease of the primary symptoms, without necessarily curing the condition (example: decreasing the pain from a flare-up of a chronic condition, such as incurable terminal cancer).  General Risks and Complications: These are associated to most interventional treatments. They can occur alone, or in combination. They fall under one of the following six (6) categories: no benefit or worsening of symptoms; bleeding; infection; nerve damage; allergic reactions; and/or death. No benefits or worsening of symptoms: In Medicine there are no guarantees, only probabilities. No healthcare provider can ever guarantee that a medical treatment will work, they can only state the probability that it may. Furthermore, there is always the possibility that the condition may worsen, either directly, or indirectly, as a consequence of the treatment. Bleeding: This is more common if the patient is taking a blood thinner, either prescription or over the counter (example: Goody Powders, Fish oil, Aspirin, Garlic, etc.),  or if suffering a condition  associated with impaired coagulation (example: Hemophilia, cirrhosis of the liver, low platelet counts, etc.). However, even if you do not have one on these, it can still happen. If you have any of these conditions, or take one of these drugs, make sure to notify your treating physician. Infection: This is more common in patients with a compromised immune system, either due to disease (example: diabetes, cancer, human immunodeficiency virus [HIV], etc.), or due to medications or treatments (example: therapies used to treat cancer and rheumatological diseases). However, even if you do not have one on these, it can still happen. If you have any of these conditions, or take one of these drugs, make sure to notify your treating physician. Nerve Damage: This is more common when the treatment is an invasive one, but it can also happen with the use of medications, such as those used in the treatment of cancer. The damage can occur to small secondary nerves, or to large primary ones, such as those in the spinal cord and brain. This damage may be temporary or permanent and it may lead to impairments that can range from temporary numbness to permanent paralysis and/or brain death. Allergic Reactions: Any time a substance or material comes in contact with our body, there is the possibility of an allergic reaction. These can range from a mild skin rash (contact dermatitis) to a severe systemic reaction (anaphylactic reaction), which can result in death. Death: In general, any medical intervention can result in death, most of the time due to an unforeseen complication. ____________________________________________________________________________________________   ______________________________________________________________________________________________  Specialty Pain Scale  Introduction:  There are significant differences in how pain is reported. The word pain usually refers to physical pain, but it is also a  common synonym of suffering. The medical community uses a scale from 0 (zero) to 10 (ten) to report pain level. Zero (0) is described as "no pain", while ten (10) is described as "the worse pain you can imagine". The problem with this scale is that physical pain is reported along with suffering. Suffering refers to mental pain, or more often yet it refers to any unpleasant feeling, emotion or aversion associated with the perception of harm or threat of harm. It is the psychological component of pain.  Pain Specialists prefer to separate the two components. The pain scale used by this practice is the Verbal Numerical Rating Scale (VNRS-11). This scale is for the physical pain only. DO NOT INCLUDE how your pain psychologically affects you. This scale is for adults 40 years of age and older. It has 11 (eleven) levels. The 1st level is 0/10. This means: "right now, I have no pain". In the context of pain management, it also means: "right now, my physical pain is under control with the current therapy".  General Information:  The scale should reflect your current level of pain. Unless you are specifically asked for the level of your worst pain, or your average pain. If you are asked for one of these two, then it should be understood that it is over the past 24 hours.  Levels 1 (one) through 5 (five) are described below, and can be treated as an outpatient. Ambulatory pain management facilities such as ours are more than adequate to treat these levels. Levels 6 (six) through 10 (ten) are also described below, however, these must be treated as a hospitalized patient. While levels 6 (six) and 7 (seven) may be evaluated at an urgent care facility, levels 8 (  eight) through 10 (ten) constitute medical emergencies and as such, they belong in a hospital's emergency department. When having these levels (as described below), do not come to our office. Our facility is not equipped to manage these levels. Go directly to an  urgent care facility or an emergency department to be evaluated.  Definitions:  Activities of Daily Living (ADL): Activities of daily living (ADL or ADLs) is a term used in healthcare to refer to people's daily self-care activities. Health professionals often use a person's ability or inability to perform ADLs as a measurement of their functional status, particularly in regard to people post injury, with disabilities and the elderly. There are two ADL levels: Basic and Instrumental. Basic Activities of Daily Living (BADL  or BADLs) consist of self-care tasks that include: Bathing and showering; personal hygiene and grooming (including brushing/combing/styling hair); dressing; Toilet hygiene (getting to the toilet, cleaning oneself, and getting back up); eating and self-feeding (not including cooking or chewing and swallowing); functional mobility, often referred to as "transferring", as measured by the ability to walk, get in and out of bed, and get into and out of a chair; the broader definition (moving from one place to another while performing activities) is useful for people with different physical abilities who are still able to get around independently. Basic ADLs include the things many people do when they get up in the morning and get ready to go out of the house: get out of bed, go to the toilet, bathe, dress, groom, and eat. On the average, loss of function typically follows a particular order. Hygiene is the first to go, followed by loss of toilet use and locomotion. The last to go is the ability to eat. When there is only one remaining area in which the person is independent, there is a 62.9% chance that it is eating and only a 3.5% chance that it is hygiene. Instrumental Activities of Daily Living (IADL or IADLs) are not necessary for fundamental functioning, but they let an individual live independently in a community. IADL consist of tasks that include: cleaning and maintaining the house; home  establishment and maintenance; care of others (including selecting and supervising caregivers); care of pets; child rearing; managing money; managing financials (investments, etc.); meal preparation and cleanup; shopping for groceries and necessities; moving within the community; safety procedures and emergency responses; health management and maintenance (taking prescribed medications); and using the telephone or other form of communication.  Instructions:  Most patients tend to report their pain as a combination of two factors, their physical pain and their psychosocial pain. This last one is also known as "suffering" and it is reflection of how physical pain affects you socially and psychologically. From now on, report them separately.  From this point on, when asked to report your pain level, report only your physical pain. Use the following table for reference.  Pain Clinic Pain Levels (0-5/10)  Pain Level Score  Description  No Pain 0   Mild pain 1 Nagging, annoying, but does not interfere with basic activities of daily living (ADL). Patients are able to eat, bathe, get dressed, toileting (being able to get on and off the toilet and perform personal hygiene functions), transfer (move in and out of bed or a chair without assistance), and maintain continence (able to control bladder and bowel functions). Blood pressure and heart rate are unaffected. A normal heart rate for a healthy adult ranges from 60 to 100 bpm (beats per minute).   Mild to  moderate pain 2 Noticeable and distracting. Impossible to hide from other people. More frequent flare-ups. Still possible to adapt and function close to normal. It can be very annoying and may have occasional stronger flare-ups. With discipline, patients may get used to it and adapt.   Moderate pain 3 Interferes significantly with activities of daily living (ADL). It becomes difficult to feed, bathe, get dressed, get on and off the toilet or to perform  personal hygiene functions. Difficult to get in and out of bed or a chair without assistance. Very distracting. With effort, it can be ignored when deeply involved in activities.   Moderately severe pain 4 Impossible to ignore for more than a few minutes. With effort, patients may still be able to manage work or participate in some social activities. Very difficult to concentrate. Signs of autonomic nervous system discharge are evident: dilated pupils (mydriasis); mild sweating (diaphoresis); sleep interference. Heart rate becomes elevated (>115 bpm). Diastolic blood pressure (lower number) rises above 100 mmHg. Patients find relief in laying down and not moving.   Severe pain 5 Intense and extremely unpleasant. Associated with frowning face and frequent crying. Pain overwhelms the senses.  Ability to do any activity or maintain social relationships becomes significantly limited. Conversation becomes difficult. Pacing back and forth is common, as getting into a comfortable position is nearly impossible. Pain wakes you up from deep sleep. Physical signs will be obvious: pupillary dilation; increased sweating; goosebumps; brisk reflexes; cold, clammy hands and feet; nausea, vomiting or dry heaves; loss of appetite; significant sleep disturbance with inability to fall asleep or to remain asleep. When persistent, significant weight loss is observed due to the complete loss of appetite and sleep deprivation.  Blood pressure and heart rate becomes significantly elevated. Caution: If elevated blood pressure triggers a pounding headache associated with blurred vision, then the patient should immediately seek attention at an urgent or emergency care unit, as these may be signs of an impending stroke.    Emergency Department Pain Levels (6-10/10)  Emergency Room Pain 6 Severely limiting. Requires emergency care and should not be seen or managed at an outpatient pain management facility. Communication becomes  difficult and requires great effort. Assistance to reach the emergency department may be required. Facial flushing and profuse sweating along with potentially dangerous increases in heart rate and blood pressure will be evident.   Distressing pain 7 Self-care is very difficult. Assistance is required to transport, or use restroom. Assistance to reach the emergency department will be required. Tasks requiring coordination, such as bathing and getting dressed become very difficult.   Disabling pain 8 Self-care is no longer possible. At this level, pain is disabling. The individual is unable to do even the most "basic" activities such as walking, eating, bathing, dressing, transferring to a bed, or toileting. Fine motor skills are lost. It is difficult to think clearly.   Incapacitating pain 9 Pain becomes incapacitating. Thought processing is no longer possible. Difficult to remember your own name. Control of movement and coordination are lost.   The worst pain imaginable 10 At this level, most patients pass out from pain. When this level is reached, collapse of the autonomic nervous system occurs, leading to a sudden drop in blood pressure and heart rate. This in turn results in a temporary and dramatic drop in blood flow to the brain, leading to a loss of consciousness. Fainting is one of the body's self defense mechanisms. Passing out puts the brain in a calmed state and causes  it to shut down for a while, in order to begin the healing process.    Summary: 1.   Refer to this scale when providing [place] with your pain level. 2.   Be accurate and careful when reporting your pain level. This will help with your care. 3.   Over-reporting your pain level will lead to loss of credibility. 4.   Even a level of 1/10 means that there is pain and will be treated at our facility. 5.   High, inaccurate reporting will be documented as "Symptom Exaggeration", leading to loss of credibility and suspicions of possible  secondary gains such as obtaining more narcotics, or wanting to appear disabled, for fraudulent reasons. 6.   Only pain levels of 5 or below will be seen at our facility. 7.   Pain levels of 6 and above will be sent to the Emergency Department and the appointment cancelled.  ______________________________________________________________________________________________   _______________________________________________________________________  Medication Rules  Purpose: To inform patients, and their family members, of our medication rules and regulations.  Applies to: All patients receiving prescriptions from our practice (written or electronic).  Pharmacy of record: This is the pharmacy where your electronic prescriptions will be sent. Make sure we have the correct one.  Electronic prescriptions: In compliance with the [place] (STOP) Act of 2017 (Session Lanny Cramp [id number]), effective September 01, 2018, all controlled substances must be electronically prescribed. Written prescriptions, faxing, or calling prescriptions to a pharmacy will no longer be done.  Prescription refills: These will be provided only during in-person appointments. No medications will be renewed without a "face-to-face" evaluation with your provider. Applies to all prescriptions.  NOTE: The following applies primarily to controlled substances (Opioid* Pain Medications).   Type of encounter (visit): For patients receiving controlled substances, face-to-face visits are required. (Not an option and not up to the patient.)  Patient's responsibilities: Pain Pills: Bring all pain pills to every appointment (except for procedure appointments). Pill Bottles: Bring pills in original pharmacy bottle. Bring bottle, even if empty. Always bring the bottle of the most recent fill.  Medication refills: You are responsible for knowing and keeping track of what medications you are taking and when  is it that you will need a refill. The day before your appointment: write a list of all prescriptions that need to be refilled. The day of the appointment: give the list to the admitting nurse. Prescriptions will be written only during appointments. No prescriptions will be written on procedure days. If you forget a medication: it will not be "Called in", "Faxed", or "electronically sent". You will need to get another appointment to get these prescribed. No early refills. Do not call asking to have your prescription filled early. Partial  or short prescriptions: Occasionally your pharmacy may not have enough pills to fill your prescription.  NEVER ACCEPT a partial fill or a prescription that is short of the total amount of pills that you were prescribed.  With controlled substances the law allows 72 hours for the pharmacy to complete the prescription.  If the prescription is not completed within 72 hours, the pharmacist will require a new prescription to be written. This means that you will be short on your medicine and we WILL NOT send another prescription to complete your original prescription.  Instead, request the pharmacy to send a carrier to a nearby branch to get enough medication to provide you with your full prescription. Prescription Accuracy: You are responsible for carefully inspecting your  prescriptions before leaving our office. Have the discharge nurse carefully go over each prescription with you, before taking them home. Make sure that your name is accurately spelled, that your address is correct. Check the name and dose of your medication to make sure it is accurate. Check the number of pills, and the written instructions to make sure they are clear and accurate. Make sure that you are given enough medication to last until your next medication refill appointment. Taking Medication: Take medication as prescribed. When it comes to controlled substances, taking less pills or less frequently than  prescribed is permitted and encouraged. Never take more pills than instructed. Never take the medication more frequently than prescribed.  Inform other Doctors: Always inform, all of your healthcare providers, of all the medications you take. Pain Medication from other Providers: You are not allowed to accept any additional pain medication from any other Doctor or Healthcare provider. There are two exceptions to this rule. (see below) In the event that you require additional pain medication, you are responsible for notifying us, as stated below. Cough Medicine: Often these contain an opioid, such as codeine or hydrocodone. Never accept or take cough medicine containing these opioids if you are already taking an opioid* medication. The combination may cause respiratory failure and death. Medication Agreement: You are responsible for carefully reading and following our Medication Agreement. This must be signed before receiving any prescriptions from our practice. Safely store a copy of your signed Agreement. Violations to the Agreement will result in no further prescriptions. (Additional copies of our Medication Agreement are available upon request.) Laws, Rules, & Regulations: All patients are expected to follow all Federal and [employer], [employer], Rules, [employer]. Ignorance of the Laws does not constitute a valid excuse.  Illegal drugs and Controlled Substances: The use of illegal substances (including, but not limited to marijuana and its derivatives) and/or the illegal use of any controlled substances is strictly prohibited. Violation of this rule may result in the immediate and permanent discontinuation of any and all prescriptions being written by our practice. The use of any illegal substances is prohibited. Adopted CDC guidelines & recommendations: Target dosing levels will be at or below 60 MME/day. Use of benzodiazepines** is not recommended.  Exceptions: There are only two exceptions to the  rule of not receiving pain medications from other Healthcare Providers. Exception #1 (Emergencies): In the event of an emergency (i.e.: accident requiring emergency care), you are allowed to receive additional pain medication. However, you are responsible for: As soon as you are able, call our office [phone], at any time of the day or night, and leave a message stating your name, the date and nature of the emergency, and the name and dose of the medication prescribed. In the event that your call is answered by a member of our staff, make sure to document and save the date, time, and the name of the person that took your information.  Exception #2 (Planned Surgery): In the event that you are scheduled by another doctor or dentist to have any type of surgery or procedure, you are allowed (for a period no longer than 30 days), to receive additional pain medication, for the acute post-op pain. However, in this case, you are responsible for picking up a copy of our "Post-op Pain Management for Surgeons" handout, and giving it to your surgeon or dentist. This document is available at our office, and does not require an appointment to obtain it. Simply go to  our office during business hours (Monday-Thursday from 8:00 AM to 4:00 PM) (Friday 8:00 AM to 12:00 Noon) or if you have a scheduled appointment with [place], prior to your surgery, and ask for it by name. In addition, you are responsible for: calling our office [phone], at any time of the day or night, and leaving a message stating your name, name of your surgeon, type of surgery, and date of procedure or surgery. Failure to comply with your responsibilities may result in termination of therapy involving the controlled substances. Medication Agreement Violation. Following the above rules, including your responsibilities will help you in avoiding a Medication Agreement Violation ("Breaking your Pain Medication Contract").  Consequences:  Not following  the above rules may result in permanent discontinuation of medication prescription therapy.  *Opioid medications include: morphine, codeine, oxycodone, oxymorphone, hydrocodone, hydromorphone, meperidine, tramadol, tapentadol, buprenorphine, fentanyl, methadone. **Benzodiazepine medications include: diazepam (Valium), alprazolam (Xanax), clonazepam (Klonopine), lorazepam (Ativan), clorazepate (Tranxene), chlordiazepoxide (Librium), estazolam (Prosom), oxazepam (Serax), temazepam (Restoril), triazolam (Halcion) (Last updated: 06/24/2022) ______________________________________________________________________

## 2022-10-02 NOTE — Progress Notes (Signed)
PROVIDER NOTE: Information contained herein reflects review and annotations entered in association with encounter. Interpretation of such information and data should be left to medically-trained personnel. Information provided to patient can be located elsewhere in the medical record under "Patient Instructions". Document created using STT-dictation technology, any transcriptional errors that may result from process are unintentional.    Patient: Stacy Martinez  Service Category: E/M  Provider: Gillis Santa, MD  DOB: 1956/08/19  DOS: 10/02/2022  Specialty: Interventional Pain Management  MRN: [id number]  Setting: Ambulatory outpatient  PCP: Jearld Fenton, NP  Type: Established Patient    Referring Provider: Jearld Fenton, NP  Location: Office  Delivery: Face-to-face     HPI  [name], a 67 y.o. 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 Ms. Melamed primary complain today is back pain and bilateral knee pain (L>R) Last encounter: My last encounter with her was on 04/24/21 Pain Assessment: Severity of Chronic pain is reported as a 10-Worst pain ever/10. Location: Back Lower/both upper legs. Onset: More than a month ago. Quality: Larence Penning. Timing: Constant. Modifying factor(s): sitting or lying down. Vitals:  height is '5\' 2"'$  (1.575 m) and weight is 240 lb (108.9 kg). Her temporal temperature is 98.2 F (36.8 C). Her blood pressure is 153/64 (abnormal) and her pulse is 79. Her respiration is 18 and oxygen saturation is 100%.   Reason for encounter: medication management.  Increased bilateral knee pain.   -primary pain is bilateral knee pain, left > right related to knee osteoarthritis. Has been told that she needs to have knee replacement surgery but would like to avoid.  She has had intra-articular steroid as well as viscosupplementation which was not effective. -secondary pain is low back pain secondary to lumbar facet arthropathy. Right L3,4,5 RFA 02/25/21  and Left L3,4,5, RFA 03/20/21 which provided 80% pain relief for over 12 months now experiencing increased low back pain.  Discussed repeating lumbar RFA -right hip replacement March 2023, states that her right hip is doing much better  ROS  Constitutional: Denies any fever or chills Gastrointestinal: No reported hemesis, hematochezia, vomiting, or acute GI distress Musculoskeletal:  Low back pain, bilateral knee pain Neurological: No reported episodes of acute onset apraxia, aphasia, dysarthria, agnosia, amnesia, paralysis, loss of coordination, or loss of consciousness  Medication Review  Insulin Pen Needle, ReliOn Ultra Thin Lancets, acetaminophen, amLODipine, carvedilol, diclofenac, empagliflozin, enalapril, fenofibrate, furosemide, gabapentin, glucose blood, insulin detemir, ondansetron, rosuvastatin, sitaGLIPtin, tiZANidine, and traMADol  History Review  Allergy: Ms. Umbaugh is allergic to prednisone and metformin and related. Drug: Ms. Arment  reports no history of drug use. Alcohol:  reports no history of alcohol use. Tobacco:  reports that she has never smoked. She has never used smokeless tobacco. Social: Ms. Stanzione  reports that she has never smoked. She has never used smokeless tobacco. She reports that she does not drink alcohol and does not use drugs. Medical:  has a past medical history of Allergy, Arthritis, Diabetes mellitus without complication ([place]), Enlarged heart, and Hypertension. Surgical: Ms. 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 Family: family history includes Cancer in her father; Diabetes in her mother; Mental illness in her son.  Laboratory Chemistry Profile   Renal Lab Results  Component Value Date   BUN 18 08/29/2022  CREATININE 0.78 08/29/2022    LABCREA 35 08/29/2022   BCR SEE NOTE: 08/29/2022   GFRAA 102 03/22/2019   GFRNONAA >60 11/30/2021     Hepatic Lab Results  Component Value Date   AST 14 08/29/2022   ALT 14 08/29/2022   ALBUMIN 3.8 11/15/2021   ALKPHOS 95 11/15/2021   LIPASE 19 12/18/2013     Electrolytes Lab Results  Component Value Date   NA 138 08/29/2022   K 5.0 08/29/2022   CL 104 08/29/2022   CALCIUM 9.6 08/29/2022     Bone No results found for: "VD25OH", "VD125OH2TOT", "HY0737TG6", "YI9485IO2", "25OHVITD1", "25OHVITD2", "25OHVITD3", "TESTOFREE", "TESTOSTERONE"   Inflammation (CRP: Acute Phase) (ESR: Chronic Phase) Lab Results  Component Value Date   LATICACIDVEN 1.72 01/02/2014       Note: Above Lab results reviewed.  Recent Imaging Review  DG HIP UNILAT W OR W/O PELVIS 2-3 VIEWS RIGHT CLINICAL DATA:  Post right hip replacement  EXAM: DG HIP (WITH OR WITHOUT PELVIS) 2-3V RIGHT  COMPARISON:  Intraoperative hip radiographs obtained earlier the same day, hip radiographs 06/04/2021  FINDINGS: The patient is status post right hip arthroplasty. Hardware alignment is within expected limits, without evidence of complication. There is expected surrounding postoperative soft tissue gas.  IMPRESSION: Status post right hip arthroplasty without evidence of complication.  Electronically Signed   By: Valetta Mole M.D.   On: 11/29/2021 15:37 DG HIP UNILAT WITH PELVIS 1V RIGHT CLINICAL DATA:  Right hip replacement.  EXAM: DG HIP (WITH OR WITHOUT PELVIS) 1V RIGHT  COMPARISON:  Hip radiographs 06/04/2021  FLUOROSCOPY: Fluoroscopy Time: 8 seconds  Radiation Exposure Index: 1.47 mGy  FINDINGS: Four intraoperative spot fluoroscopic images are provided during performance of a right total hip arthroplasty. No acute fracture or other complication is identified on these limited images.  IMPRESSION: Intraoperative images during right hip arthroplasty.  Electronically Signed   By: Logan Bores  M.D.   On: 11/29/2021 14:24 DG C-Arm 1-60 Min-No Report Fluoroscopy was utilized by the requesting physician.  No radiographic  interpretation.  DG C-Arm 1-60 Min-No Report Fluoroscopy was utilized by the requesting physician.  No radiographic  interpretation.   FINDINGS: There is anterolisthesis of L5 on S1 by approximately 6 mm and anterolisthesis of L4 on L5 by approximately 9 mm. These malalignments appear to be due to significant degenerative change involving the facet joints at these levels. There is degenerative disc disease at both L4-5 and L5-S1 levels. No compression deformity is seen. The SI joints appear corticated.   IMPRESSION: 1. Anterolisthesis of L5 on S1 and L4 on L5 as noted above most likely degenerative in origin. 2. Degenerative disc disease at both L4-5 and L5-S1 levels.    Note: Reviewed        Physical Exam  General appearance: Well nourished, well developed, and well hydrated. In no apparent acute distress Mental status: Alert, oriented x 3 (person, place, & time)       Respiratory: No evidence of acute respiratory distress Eyes: PERLA Vitals: BP (!) 153/64   Pulse 79   Temp 98.2 F (36.8 C) (Temporal)   Resp 18   Ht '5\' 2"'$  (1.575 m)   Wt 240 lb (108.9 kg)   SpO2 100%   BMI 43.90 kg/m  BMI: Estimated body mass index is 43.9 kg/m as calculated from the following:   Height as of this encounter: '5\' 2"'$  (1.575 m).   Weight as of this encounter: 240 lb (108.9 kg). Ideal: Ideal body weight:  50.1 kg (110 lb 7.2 oz) Adjusted ideal body weight: 73.6 kg (162 lb 4.3 oz)  Lumbar Spine Area Exam  Skin & Axial Inspection: No masses, redness, or swelling Alignment: Symmetrical Functional ROM: Pain restricted ROM affecting both sides Stability: No instability detected Muscle Tone/Strength: Functionally intact. No obvious neuro-muscular anomalies detected. Sensory (Neurological): Musculoskeletal pain pattern Palpation: No palpable anomalies        Provocative Tests: Hyperextension/rotation test: (+) bilaterally for facet joint pain. Lumbar quadrant test (Kemp's test): (+) bilaterally for facet joint pain. Lateral bending test: deferred today        Gait & Posture Assessment  Ambulation: Unassisted Gait: Relatively normal for age and body habitus Posture: WNL  Lower Extremity Exam    Side: Right lower extremity  Side: Left lower extremity  Stability: No instability observed          Stability: No instability observed          Skin & Extremity Inspection: Skin color, temperature, and hair growth are WNL. No peripheral edema or cyanosis. No masses, redness, swelling, asymmetry, or associated skin lesions. No contractures.  Skin & Extremity Inspection: Skin color, temperature, and hair growth are WNL. No peripheral edema or cyanosis. No masses, redness, swelling, asymmetry, or associated skin lesions. No contractures.  Functional ROM: Pain restricted ROM for knee joint          Functional ROM: Pain restricted ROM for knee joint          Muscle Tone/Strength: Functionally intact. No obvious neuro-muscular anomalies detected.  Muscle Tone/Strength: Functionally intact. No obvious neuro-muscular anomalies detected.  Sensory (Neurological): Arthropathic arthralgia        Sensory (Neurological): Arthropathic arthralgia        DTR: Patellar: deferred today Achilles: deferred today Plantar: deferred today  DTR: Patellar: deferred today Achilles: deferred today Plantar: deferred today  Palpation: No palpable anomalies  Palpation: No palpable anomalies   Assessment   Status Diagnosis  Having a Flare-up Having a Flare-up Having a Flare-up 1. Facet syndrome, lumbar   2. Lumbar spondylosis   3. Bilateral primary osteoarthritis of knee   4. Spondylosis without myelopathy or radiculopathy, lumbar region   5. Chronic pain syndrome        Plan of Care   Ms. Arlyss Queen Kirschbaum has a current medication list which includes the following  long-term medication(s): amlodipine, carvedilol, enalapril, fenofibrate, furosemide, gabapentin, levemir flexpen, rosuvastatin, and sitagliptin.  1. Facet syndrome, lumbar - Radiofrequency,Lumbar; Future - Radiofrequency,Lumbar; Future - diclofenac (VOLTAREN) 50 MG EC tablet; Take 1 tablet (50 mg total) by mouth 2 (two) times daily.  Dispense: 60 tablet; Refill: 0  2. Lumbar spondylosis - Radiofrequency,Lumbar; Future - Radiofrequency,Lumbar; Future - diclofenac (VOLTAREN) 50 MG EC tablet; Take 1 tablet (50 mg total) by mouth 2 (two) times daily.  Dispense: 60 tablet; Refill: 0  3. Bilateral primary osteoarthritis of knee - GENICULAR NERVE BLOCK; Future - diclofenac (VOLTAREN) 50 MG EC tablet; Take 1 tablet (50 mg total) by mouth 2 (two) times daily.  Dispense: 60 tablet; Refill: 0  4. Spondylosis without myelopathy or radiculopathy, lumbar region - Radiofrequency,Lumbar; Future - Radiofrequency,Lumbar; Future  5. Chronic pain syndrome - Radiofrequency,Lumbar; Future - Radiofrequency,Lumbar; Future - GENICULAR NERVE BLOCK; Future - diclofenac (VOLTAREN) 50 MG EC tablet; Take 1 tablet (50 mg total) by mouth 2 (two) times daily.  Dispense: 60 tablet; Refill: 0    Pharmacotherapy (Medications Ordered): Meds ordered this encounter  Medications   tiZANidine (ZANAFLEX) 4 MG tablet  Sig: Take 1 tablet (4 mg total) by mouth every 8 (eight) hours as needed for muscle spasms.    Dispense:  90 tablet    Refill:  0   traMADol (ULTRAM) 50 MG tablet    Sig: Take 1-2 tablets (50-100 mg total) by mouth every 6 (six) hours as needed for severe pain.    Dispense:  60 tablet    Refill:  1    Fill one day early if pharmacy is closed on scheduled refill date. Do not fill until: To last until:   diclofenac (VOLTAREN) 50 MG EC tablet    Sig: Take 1 tablet (50 mg total) by mouth 2 (two) times daily.    Dispense:  60 tablet    Refill:  0   Orders:  Orders Placed This Encounter  Procedures    Radiofrequency,Lumbar    Standing Status:   Future    Standing Expiration Date:   12/31/2022    Scheduling Instructions:     Side(s): LEFT     Level(s): L3, L4, L5, Medial Branch Nerve(s)     Sedation: With Sedation- IV Versed     Scheduling Timeframe: As soon as pre-approved    Order Specific Question:   Where will this procedure be performed?    Answer:   [facility]   Radiofrequency,Lumbar    Standing Status:   Future    Standing Expiration Date:   12/31/2022    Scheduling Instructions:     Side(s): RIGHT     Level(s): L3, L4, L5, Medial Branch Nerve(s)     Sedation: With Sedation- IV Versed     Scheduling Timeframe: 2 weeks after left    Order Specific Question:   Where will this procedure be performed?    Answer:   [facility]   GENICULAR NERVE BLOCK    Indication(s):  Sub-acute knee pain    Standing Status:   Future    Standing Expiration Date:   12/31/2022    Scheduling Instructions:     Side: Bilateral     Sedation: with IV Versed     Timeframe: As soon as schedule allows    Order Specific Question:   Where will this procedure be performed?    Answer:   [facility]   Follow-up plan:   Return in about 2 weeks (around 10/16/2022) for Left L3, 4, 5 RFA + B/L GNB (block 1 hr), in clinic IV Versed.    Recent Visits No visits were found meeting these conditions. Showing recent visits within past 90 days and meeting all other requirements Today's Visits Date Type Provider Dept  10/02/22 Office Visit Gillis Santa, MD [facility]  Showing today's visits and meeting all other requirements Future Appointments No visits were found meeting these conditions. Showing future appointments within next 90 days and meeting all other requirements  I discussed the assessment and treatment plan with the patient. The patient was provided an opportunity to ask questions and all were answered. The patient agreed with the plan and demonstrated an understanding  of the instructions.  Patient advised to call back or seek an in-person evaluation if the symptoms or condition worsens.  Duration of encounter: 30 minutes.  Note by: Gillis Santa, MD Date: 10/02/2022; Time: 10:00 AM

## 2022-10-07 ENCOUNTER — Telehealth: Payer: Self-pay | Admitting: Internal Medicine

## 2022-10-07 NOTE — Telephone Encounter (Signed)
LEVEMIR FLEXPEN 100 UNIT/ML FlexPen / pt received a letter from [facility] stating not to cover stated meds and is stating other options, Basaglar Kwick pen or flex touch pen. FU with pt  [phone]

## 2022-10-08 NOTE — Telephone Encounter (Signed)
Pt advised it is okay to switch to [employer].   Thanks,   -Mickel Baas

## 2022-10-08 NOTE — Telephone Encounter (Signed)
Sent MyChart message on 1/16 to to confirm that patient is okay switching to [employer] because insurance will no longer cover Levemir.  She has not responded.  Can we reach out to her regarding this?

## 2022-10-09 ENCOUNTER — Other Ambulatory Visit: Payer: Self-pay | Admitting: Internal Medicine

## 2022-10-09 MED ORDER — BASAGLAR KWIKPEN 100 UNIT/ML ~~LOC~~ SOPN: 32.0000 [IU] | PEN_INJECTOR | Freq: Every day | SUBCUTANEOUS | 1 refills | Status: DC

## 2022-10-09 NOTE — Telephone Encounter (Signed)
Basaglar sent to pharmacy

## 2022-10-09 NOTE — Telephone Encounter (Signed)
Requested Prescriptions  Pending Prescriptions Disp Refills   LEVEMIR FLEXPEN 100 UNIT/ML FlexPen [Pharmacy Med Name: Levemir FlexPen 100 UNIT/ML Subcutaneous Solution Pen-injector] 15 mL 0    Sig: INJECT 32 UNITS SUBCUTANEOUSLY AT BEDTIME - USE AS DIRECTED UP TO 45 UNITS PER DAY     Endocrinology:  Diabetes - Insulins Failed - 10/09/2022 12:53 PM      Failed - HBA1C is between 0 and 7.9 and within 180 days    Hemoglobin A1C  Date Value Ref Range Status  01/06/2017 8.0  Final   Hgb A1c MFr Bld  Date Value Ref Range Status  08/29/2022 8.5 (H) <5.7 % of total Hgb Final    Comment:    For someone without known diabetes, a hemoglobin A1c value of 6.5% or greater indicates that they may have  diabetes and this should be confirmed with a follow-up  test. . For someone with known diabetes, a value <7% indicates  that their diabetes is well controlled and a value  greater than or equal to 7% indicates suboptimal  control. A1c targets should be individualized based on  duration of diabetes, age, comorbid conditions, and  other considerations. . Currently, no consensus exists regarding use of hemoglobin A1c for diagnosis of diabetes for children. Renella Cunas - Valid encounter within last 6 months    Recent Outpatient Visits           1 month ago DM type 2 with diabetic peripheral neuropathy [facility]   [facility] [place], [place] W, NP   8 months ago DM type 2 with diabetic peripheral neuropathy [facility])   [facility] [place], [place] Keens, NP   1 year ago DM type 2 with diabetic peripheral neuropathy [facility])   [facility] [place], [place]alie Keens, NP   1 year ago Chronic right hip pain   [facility] [place], [place]alie Keens, NP   1 year ago DM type 2 with diabetic peripheral neuropathy [facility])   [facility] [place], Coralie Keens, [place]

## 2022-10-09 NOTE — Addendum Note (Signed)
Addended by: Jearld Fenton on: 10/09/2022 08:49 AM   Modules accepted: Orders

## 2022-10-26 ENCOUNTER — Other Ambulatory Visit: Payer: Self-pay | Admitting: Student in an Organized Health Care Education/Training Program

## 2022-10-26 DIAGNOSIS — M170 Bilateral primary osteoarthritis of knee: Secondary | ICD-10-CM

## 2022-10-26 DIAGNOSIS — G894 Chronic pain syndrome: Secondary | ICD-10-CM

## 2022-10-26 DIAGNOSIS — M47816 Spondylosis without myelopathy or radiculopathy, lumbar region: Secondary | ICD-10-CM

## 2022-10-29 ENCOUNTER — Ambulatory Visit
Admission: RE | Admit: 2022-10-29 | Discharge: 2022-10-29 | Disposition: A | Discharge status: Home / Self Care | Payer: Medicare Other | Source: Ambulatory Visit | Attending: Student in an Organized Health Care Education/Training Program | Admitting: Student in an Organized Health Care Education/Training Program

## 2022-10-29 ENCOUNTER — Ambulatory Visit
Payer: Medicare Other | Attending: Student in an Organized Health Care Education/Training Program | Admitting: Student in an Organized Health Care Education/Training Program

## 2022-10-29 ENCOUNTER — Encounter: Payer: Self-pay | Admitting: Student in an Organized Health Care Education/Training Program

## 2022-10-29 VITALS — BP 122/64 | HR 71 | Temp 97.0°F | Resp 16 | Ht 62.0 in | Wt 240.0 lb

## 2022-10-29 DIAGNOSIS — G894 Chronic pain syndrome: Secondary | ICD-10-CM

## 2022-10-29 DIAGNOSIS — M170 Bilateral primary osteoarthritis of knee: Secondary | ICD-10-CM | POA: Diagnosis present

## 2022-10-29 DIAGNOSIS — M47816 Spondylosis without myelopathy or radiculopathy, lumbar region: Secondary | ICD-10-CM | POA: Diagnosis present

## 2022-10-29 MED ORDER — ROPIVACAINE HCL 2 MG/ML IJ SOLN
9.0000 mL | Freq: Once | INTRAMUSCULAR | Status: AC
  Administered 2022-10-29: 9 mL via PERINEURAL
  Filled 2022-10-29: qty 20

## 2022-10-29 MED ORDER — LIDOCAINE HCL 2 % IJ SOLN
20.0000 mL | Freq: Once | INTRAMUSCULAR | Status: AC
  Administered 2022-10-29: 400 mg
  Filled 2022-10-29: qty 40

## 2022-10-29 MED ORDER — LACTATED RINGERS IV SOLN: Freq: Once | INTRAVENOUS | Status: AC

## 2022-10-29 MED ORDER — DEXAMETHASONE SODIUM PHOSPHATE 10 MG/ML IJ SOLN
10.0000 mg | Freq: Once | INTRAMUSCULAR | Status: AC
  Administered 2022-10-29: 10 mg
  Filled 2022-10-29: qty 1

## 2022-10-29 MED ORDER — MIDAZOLAM HCL 2 MG/2ML IJ SOLN
0.5000 mg | Freq: Once | INTRAMUSCULAR | Status: AC
  Administered 2022-10-29: 2 mg via INTRAVENOUS
  Filled 2022-10-29: qty 2

## 2022-10-29 NOTE — Progress Notes (Signed)
0900 POSITIONING PATIENT FOR RFA. TOLERATED WELL.

## 2022-10-29 NOTE — Progress Notes (Signed)
Safety precautions to be maintained throughout the outpatient stay will include: orient to surroundings, keep bed in low position, maintain call bell within reach at all times, provide assistance with transfer out of bed and ambulation.  

## 2022-10-29 NOTE — Progress Notes (Signed)
PROVIDER NOTE: Information contained herein reflects review and annotations entered in association with encounter. Interpretation of such information and data should be left to medically-trained personnel. Information provided to patient can be located elsewhere in the medical record under "Patient Instructions". Document created using STT-dictation technology, any transcriptional errors that may result from process are unintentional.    Patient: Stacy Martinez  Service Category: Procedure  Provider: Gillis Santa, MD  DOB: [birth year]  DOS: 10/29/2022  Location: [facility]  MRN: [id number]  Setting: Ambulatory - outpatient  Referring Provider: Gillis Santa, MD  Type: Established Patient  Specialty: Interventional Pain Management  PCP: Jearld Fenton, NP   Primary Reason for Visit: Interventional Pain Management Treatment. CC: Back Pain (Lower left side)   Procedure:          Anesthesia, Analgesia, Anxiolysis:  Type: Thermal Lumbar Facet, Medial Branch Radiofrequency Ablation/Neurotomy           Primary Purpose: Therapeutic Region: Posterolateral Lumbosacral Spine Level: L3, L4, L5, Medial Branch Level(s). These levels will denervate the L3-4, L4-5, lumbar facet joints. Laterality: Left  Type:  minimal sedation  IV Versed Indication(s): Anxiety Route: Intravenous (IV) IV Access: Secured Sedation: Meaningful verbal contact was maintained at all times during the procedure  Local Anesthetic: Lidocaine 1-2%  Position: Prone   Indications: 1. Facet syndrome, lumbar   2. Lumbar spondylosis   3. Bilateral primary osteoarthritis of knee   4. Spondylosis without myelopathy or radiculopathy, lumbar region   5. Chronic pain syndrome    Ms. Sanfilippo has been dealing with the above chronic pain for longer than three months and has either failed to respond, was unable to tolerate, or simply did not get enough benefit from other more conservative therapies including, but not limited to: 1.  Over-the-counter medications 2. Anti-inflammatory medications 3. Muscle relaxants 4. Membrane stabilizers 5. Opioids 6. Physical therapy and/or chiropractic manipulation 7. Modalities (Heat, ice, etc.) 8. Invasive techniques such as nerve blocks. Ms. Leuschen has attained more than 50% relief of the pain from a series of diagnostic injections conducted in separate occasions.  Pain Score: Pre-procedure: 6 /10 Post-procedure: 0-No pain/10  Pre-op H&P Assessment:  Ms. Pyun is a 67 y.o. (year old), female patient, seen today for interventional treatment. 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 Ms. Mott has a current medication list which includes the following prescription(s): acetaminophen, amlodipine, carvedilol, diclofenac, enalapril, fenofibrate, furosemide, gabapentin, relion glucose test strips, basaglar kwikpen, jardiance, ondansetron, relion ultra thin lancets, rosuvastatin, sitagliptin, tizanidine, tramadol, and ulticare mini pen needles. Her primarily concern today is the Back Pain (Lower left side)   Initial Vital Signs:  Pulse/HCG Rate: 76ECG Heart Rate: 74 Temp: 97.6 F (36.4 C) Resp: 18 BP: 129/60 SpO2: 97 %  BMI: Estimated body mass index is 43.9 kg/m as calculated from the following:   Height as of this encounter: '5\' 2"'$  (1.575 m).   Weight as of this encounter: 240 lb (108.9 kg).  Risk Assessment: Allergies: Reviewed. She is allergic to prednisone and metformin and related.  Allergy Precautions: None required Coagulopathies: Reviewed. None identified.  Blood-thinner therapy: None at this time Active Infection(s): Reviewed. None identified. Ms. Ruse is afebrile  Site Confirmation: Ms. Malsch was asked to confirm the procedure and laterality before marking  the site Procedure checklist: Completed Consent: Before the procedure  and under the influence of no sedative(s), amnesic(s), or anxiolytics, the patient was informed of the treatment options, risks and possible complications. To fulfill our ethical and legal obligations, as recommended by the American Medical Association's Code of Ethics, I have informed the patient of my clinical impression; the nature and purpose of the treatment or procedure; the risks, benefits, and possible complications of the intervention; the alternatives, including doing nothing; the risk(s) and benefit(s) of the alternative treatment(s) or procedure(s); and the risk(s) and benefit(s) of doing nothing. The patient was provided information about the general risks and possible complications associated with the procedure. These may include, but are not limited to: failure to achieve desired goals, infection, bleeding, organ or nerve damage, allergic reactions, paralysis, and death. In addition, the patient was informed of those risks and complications associated to Spine-related procedures, such as failure to decrease pain; infection (i.e.: Meningitis, epidural or intraspinal abscess); bleeding (i.e.: epidural hematoma, subarachnoid hemorrhage, or any other type of intraspinal or peri-dural bleeding); organ or nerve damage (i.e.: Any type of peripheral nerve, nerve root, or spinal cord injury) with subsequent damage to sensory, motor, and/or autonomic systems, resulting in permanent pain, numbness, and/or weakness of one or several areas of the body; allergic reactions; (i.e.: anaphylactic reaction); and/or death. Furthermore, the patient was informed of those risks and complications associated with the medications. These include, but are not limited to: allergic reactions (i.e.: anaphylactic or anaphylactoid reaction(s)); adrenal axis suppression; blood sugar elevation that in diabetics may result in ketoacidosis or comma; water  retention that in patients with history of congestive heart failure may result in shortness of breath, pulmonary edema, and decompensation with resultant heart failure; weight gain; swelling or edema; medication-induced neural toxicity; particulate matter embolism and blood vessel occlusion with resultant organ, and/or nervous system infarction; and/or aseptic necrosis of one or more joints. Finally, the patient was informed that Medicine is not an exact science; therefore, there is also the possibility of unforeseen or unpredictable risks and/or possible complications that may result in a catastrophic outcome. The patient indicated having understood very clearly. We have given the patient no guarantees and we have made no promises. Enough time was given to the patient to ask questions, all of which were answered to the patient's satisfaction. Ms. Huwe has indicated that she wanted to continue with the procedure. Attestation: I, the ordering provider, attest that I have discussed with the patient the benefits, risks, side-effects, alternatives, likelihood of achieving goals, and potential problems during recovery for the procedure that I have provided informed consent. Date  Time: 10/29/2022  8:16 AM  Pre-Procedure Preparation:  Monitoring: As per clinic protocol. Respiration, ETCO2, SpO2, BP, heart rate and rhythm monitor placed and checked for adequate function Safety Precautions: Patient was assessed for positional comfort and pressure points before starting the procedure. Time-out: I initiated and conducted the "Time-out" before starting the procedure, as per protocol. The patient was asked to participate by confirming the accuracy of the "Time Out" information. Verification of the correct person, site, and procedure were performed and confirmed by me, the nursing staff, and the patient. "Time-out" conducted as per Joint Commission's Universal Protocol (UP.01.01.01). Time: 0910  Description of  Procedure:          Laterality: Left Levels:  L3, L4, L5, Medial Branch Level(s), at the L3-4, L4-5, lumbar facet joints. Area Prepped: Lumbosacral DuraPrep (Iodine Povacrylex [0.7% available iodine] and Isopropyl Alcohol, 74% w/w) Safety Precautions: Aspiration looking for blood return was conducted prior to  all injections. At no point did we inject any substances, as a needle was being advanced. Before injecting, the patient was told to immediately notify me if she was experiencing any new onset of "ringing in the ears, or metallic taste in the mouth". No attempts were made at seeking any paresthesias. Safe injection practices and needle disposal techniques used. Medications properly checked for expiration dates. SDV (single dose vial) medications used. After the completion of the procedure, all disposable equipment used was discarded in the proper designated medical waste containers. Local Anesthesia: Protocol guidelines were followed. The patient was positioned over the fluoroscopy table. The area was prepped in the usual manner. The time-out was completed. The target area was identified using fluoroscopy. A 12-in long, straight, sterile hemostat was used with fluoroscopic guidance to locate the targets for each level blocked. Once located, the skin was marked with an approved surgical skin marker. Once all sites were marked, the skin (epidermis, dermis, and hypodermis), as well as deeper tissues (fat, connective tissue and muscle) were infiltrated with a small amount of a short-acting local anesthetic, loaded on a 10cc syringe with a 25G, 1.5-in  Needle. An appropriate amount of time was allowed for local anesthetics to take effect before proceeding to the next step. Local Anesthetic: Lidocaine 2.0% The unused portion of the local anesthetic was discarded in the proper designated containers. Technical explanation of process:   Radiofrequency Ablation (RFA) L3 Medial Branch Nerve RFA: The target area  for the L3 medial branch is at the junction of the postero-lateral aspect of the superior articular process and the superior, posterior, and medial edge of the transverse process of L4. Under fluoroscopic guidance, a Radiofrequency needle was inserted until contact was made with os over the superior postero-lateral aspect of the pedicular shadow (target area). Sensory and motor testing was conducted to properly adjust the position of the needle. Once satisfactory placement of the needle was achieved, the numbing solution was slowly injected after negative aspiration for blood. 2.0 mL of the nerve block solution was injected without difficulty or complication. After waiting for at least 3 minutes, the ablation was performed. Once completed, the needle was removed intact. L4 Medial Branch Nerve RFA: The target area for the L4 medial branch is at the junction of the postero-lateral aspect of the superior articular process and the superior, posterior, and medial edge of the transverse process of L5. Under fluoroscopic guidance, a Radiofrequency needle was inserted until contact was made with os over the superior postero-lateral aspect of the pedicular shadow (target area). Sensory and motor testing was conducted to properly adjust the position of the needle. Once satisfactory placement of the needle was achieved, the numbing solution was slowly injected after negative aspiration for blood. 2.0 mL of the nerve block solution was injected without difficulty or complication. After waiting for at least 3 minutes, the ablation was performed. Once completed, the needle was removed intact. L5 Medial Branch Nerve RFA: The target area for the L5 medial branch is at the junction of the postero-lateral aspect of the superior articular process of S1 and the superior, posterior, and medial edge of the sacral ala. Under fluoroscopic guidance, a Radiofrequency needle was inserted until contact was made with os over the superior  postero-lateral aspect of the pedicular shadow (target area). Sensory and motor testing was conducted to properly adjust the position of the needle. Once satisfactory placement of the needle was achieved, the numbing solution was slowly injected after negative aspiration for blood. 2.0  mL of the nerve block solution was injected without difficulty or complication. After waiting for at least 3 minutes, the ablation was performed. Once completed, the needle was removed intact.  Radiofrequency lesioning (ablation):  Radiofrequency Generator: NeuroTherm [id number] Sensory Stimulation Parameters: 50 Hz was used to locate & identify the nerve, making sure that the needle was positioned such that there was no sensory stimulation below 0.3 V or above 0.7 V. Motor Stimulation Parameters: 2 Hz was used to evaluate the motor component. Care was taken not to lesion any nerves that demonstrated motor stimulation of the lower extremities at an output of less than 2.5 times that of the sensory threshold, or a maximum of 2.0 V. Lesioning Technique Parameters: Standard Radiofrequency settings. (Not bipolar or pulsed.) Temperature Settings: 80 degrees C Lesioning time: 60 seconds Intra-operative Compliance: Compliant Materials & Medications: Needle(s) (Electrode/Cannula) Type: Teflon-coated, curved tip, Radiofrequency needle(s) Gauge: 22G Length: 10cm Numbing solution: 6 cc solution made of  5 cc of 0.2% ropivacaine, 1 cc of Decadron 10 mg/cc.  2 cc injected at each level above for  the left side after sensorimotor testing, prior to lesioning. Once the entire procedure was completed, the treated area was cleaned, making sure to leave some of the prepping solution back to take advantage of its long term bactericidal properties.    Illustration of the posterior view of the lumbar spine and the posterior neural structures. Laminae of L2 through S1 are labeled. DPRL5, dorsal primary ramus of L5; DPRS1, dorsal primary ramus  of S1; DPR3, dorsal primary ramus of L3; FJ, facet (zygapophyseal) joint L3-L4; I, inferior articular process of L4; LB1, lateral branch of dorsal primary ramus of L1; IAB, inferior articular branches from L3 medial branch (supplies L4-L5 facet joint); IBP, intermediate branch plexus; MB3, medial branch of dorsal primary ramus of L3; NR3, third lumbar nerve root; S, superior articular process of L5; SAB, superior articular branches from L4 (supplies L4-5 facet joint also); TP3, transverse process of L3.  Vitals:   10/29/22 0915 10/29/22 0920 10/29/22 0927 10/29/22 0933  BP: (!) 126/59 136/63  122/64  Pulse:    71  Resp: '11 15 15 16  '$ Temp:    (!) 97 F (36.1 C)  TempSrc:      SpO2: 98% 97% 96% 98%  Weight:      Height:       Start Time: 0910 hrs. End Time: 0927 hrs.  Imaging Guidance (Spinal):          Type of Imaging Technique: Fluoroscopy Guidance (Spinal) Indication(s): Assistance in needle guidance and placement for procedures requiring needle placement in or near specific anatomical locations not easily accessible without such assistance. Exposure Time: Please see nurses notes. Contrast: None used. Fluoroscopic Guidance: I was personally present during the use of fluoroscopy. "Tunnel Vision Technique" used to obtain the best possible view of the target area. Parallax error corrected before commencing the procedure. "Direction-depth-direction" technique used to introduce the needle under continuous pulsed fluoroscopy. Once target was reached, antero-posterior, oblique, and lateral fluoroscopic projection used confirm needle placement in all planes. Images permanently stored in EMR. Interpretation: No contrast injected. I personally interpreted the imaging intraoperatively. Adequate needle placement confirmed in multiple planes. Permanent images saved into the patient's record.   Post-operative Assessment:  Post-procedure Vital Signs:  Pulse/HCG Rate: 7167 Temp: (!) 97 F (36.1  C) Resp: 16 BP: 122/64 SpO2: 98 %  EBL: None  Complications: No immediate post-treatment complications observed by team, or reported by patient.  Note: The patient tolerated the entire procedure well. A repeat set of vitals were taken after the procedure and the patient was kept under observation following institutional policy, for this type of procedure. Post-procedural neurological assessment was performed, showing return to baseline, prior to discharge. The patient was provided with post-procedure discharge instructions, including a section on how to identify potential problems. Should any problems arise concerning this procedure, the patient was given instructions to immediately contact us, at any time, without hesitation. In any case, we plan to contact the patient by telephone for a follow-up status report regarding this interventional procedure.  Comments:  No additional relevant information.  Plan of Care  Orders:  Orders Placed This Encounter  Procedures   DG [facility] C-ARM 1-60 MIN NO REPORT    Intraoperative interpretation by procedural physician at [facility].    Standing Status:   Standing    Number of Occurrences:   1    Order Specific Question:   Reason for exam:    Answer:   Assistance in needle guidance and placement for procedures requiring needle placement in or near specific anatomical locations not easily accessible without such assistance.    Medications ordered for procedure: Meds ordered this encounter  Medications   lidocaine (XYLOCAINE) 2 % (with pres) injection 400 mg   lactated ringers infusion   midazolam (VERSED) injection 0.5-2 mg    Make sure Flumazenil is available in the pyxis when using this medication. If oversedation occurs, administer 0.2 mg IV over 15 sec. If after 45 sec no response, administer 0.2 mg again over 1 min; may repeat at 1 min intervals; not to exceed 4 doses (1 mg)   dexamethasone (DECADRON) injection 10 mg    dexamethasone (DECADRON) injection 10 mg   ropivacaine (PF) 2 mg/mL (0.2%) (NAROPIN) injection 9 mL   ropivacaine (PF) 2 mg/mL (0.2%) (NAROPIN) injection 9 mL    Medications administered: We administered lidocaine, lactated ringers, midazolam, dexamethasone, dexamethasone, ropivacaine (PF) 2 mg/mL (0.2%), and ropivacaine (PF) 2 mg/mL (0.2%).  See the medical record for exact dosing, route, and time of administration.  Follow-up plan:   Return for [facility].Marland Kitchen     Recent Visits Date Type Provider Dept  10/02/22 Office Visit Gillis Santa, MD [facility]  Showing recent visits within past 90 days and meeting all other requirements Today's Visits Date Type Provider Dept  10/29/22 Procedure visit Gillis Santa, MD [facility]  Showing today's visits and meeting all other requirements Future Appointments Date Type Provider Dept  11/26/22 Appointment Gillis Santa, MD [facility]  Showing future appointments within next 90 days and meeting all other requirements  Disposition: Discharge home  Discharge (Date  Time): 10/29/2022; 0936 hrs.   Primary Care Physician: Jearld Fenton, NP Location: [facility] Note by: Gillis Santa, MD Date: 10/29/2022; Time: 9:54 AM  Disclaimer:  Medicine is not an exact science. The only guarantee in medicine is that nothing is guaranteed. It is important to note that the decision to proceed with this intervention was based on the information collected from the patient. The Data and conclusions were drawn from the patient's questionnaire, the interview, and the physical examination. Because the information was provided in large part by the patient, it cannot be guaranteed that it has not been purposely or unconsciously manipulated. Every effort has been made to obtain as much relevant data as possible for this evaluation. It is important to note  that the conclusions that lead to  this procedure are derived in large part from the available data. Always take into account that the treatment will also be dependent on availability of resources and existing treatment guidelines, considered by other Pain Management Practitioners as being common knowledge and practice, at the time of the intervention. For Medico-Legal purposes, it is also important to point out that variation in procedural techniques and pharmacological choices are the acceptable norm. The indications, contraindications, technique, and results of the above procedure should only be interpreted and judged by a Board-Certified Interventional Pain Specialist with extensive familiarity and expertise in the same exact procedure and technique.

## 2022-10-29 NOTE — Progress Notes (Signed)
PROVIDER NOTE: Interpretation of information contained herein should be left to medically-trained personnel. Specific patient instructions are provided elsewhere under "Patient Instructions" section of medical record. This document was created in part using STT-dictation technology, any transcriptional errors that may result from this process are unintentional.  Patient: Stacy Martinez Type: Established DOB: [birth year] MRN: [id number] PCP: Jearld Fenton, NP  Service: Procedure DOS: 10/29/2022 Setting: Ambulatory Location: Ambulatory outpatient facility Delivery: Face-to-face Provider: Gillis Santa, MD Specialty: Interventional Pain Management Specialty designation: 09 Location: Outpatient facility Ref. Prov.: Gillis Santa, MD       Interventional Therapy   Primary Reason for Visit: Interventional Pain Management Treatment. CC: B/L knee pain related to knee OA   Procedure:               Type: Genicular Nerves Block (Superolateral, Superomedial, and Inferomedial Genicular Nerves)  #1  Laterality: Bilateral (-50)  Level: Superior and inferior to the knee joint.  Imaging: Fluoroscopic guidance Anesthesia: Local anesthesia (1-2% Lidocaine) Sedation: Minimal Sedation IV Versed DOS: 10/29/2022  Performed by: Gillis Santa, MD  Purpose: Diagnostic/Therapeutic Indications: Chronic knee pain severe enough to impact quality of life or function. Rationale (medical necessity): procedure needed and proper for the diagnosis and/or treatment of Stacy Martinez's medical symptoms and needs. B/L knee OA  NAS-11 Pain score:   Pre-procedure: 6 /10   Post-procedure: 0-No pain/10     Target: For Genicular Nerve block(s), the targets are: the superolateral genicular nerve, located in the lateral distal portion of the femoral shaft as it curves to form the lateral epicondyle, in the region of the distal femoral metaphysis; the superomedial genicular nerve, located in the medial distal portion of the femoral  shaft as it curves to form the medial epicondyle; and the inferomedial genicular nerve, located in the medial, proximal portion of the tibial shaft, as it curves to form the medial epicondyle, in the region of the proximal tibial metaphysis.  Location: Superolateral, Superomedial, and Inferomedial aspects of knee joint.  Region: Lateral, Anterior, and Medial aspects of the knee joint, above and below the knee joint proper. Approach: Percutaneous  Type of procedure: Percutaneous perineural nerve block. The genicular nerve block is a motor-sparing technique that anesthetizes the sensory terminal branches innervating the knee joint, resulting in anesthesia of the anterior compartment of the knee. The distribution of anesthesia of each nerve is mostly in the corresponding quadrant.  Neuroanatomy: The superolateral genicular nerve (SLGN) courses around the femur shaft to pass between the vastus lateralis and the lateral epicondyle. It accompanies the superior lateral genicular artery. The superomedial genicular nerve (SMGN) courses around the femur shaft, following the superior medial genicular artery, to pass between the adductor magnus tendon and the medial epicondyle below the vastus medialis. The inferolateral genicular nerve (ILGN) courses around the tibial lateral epicondyle deep to the lateral collateral ligament, following the inferior lateral genicular artery, superior of the fibula head. The inferomedial genicular nerve (IMGN) courses horizontally below the medial collateral ligament between the tibial medial epicondyle and the insertion of the collateral ligament. It accompanies the inferior medial genicular artery. The recurrent peroneal nerve originates in the inferior popliteal region from the common peroneal nerve and courses horizontally around the fibula to pass just inferior of the fibula head and travel superior to the anterolateral tibial epicondyle. It accompanies the recurrent tibial  artery.  Position / Prep / Materials:  Position: Supine, Modified Fowler's position with pillows under the targeted knee(s). The patient is placed in a supine position  with the knee slightly flexed by placing a pillow in the popliteal fossa. Prep solution: DuraPrep (Iodine Povacrylex [0.7% available iodine] and Isopropyl Alcohol, 74% w/w) Prep Area: Entire knee area, from mid-thigh to mid-shin, lateral, anterior, and medial aspects. Materials:  Tray: Block Needle(s):  Type: Spinal  Gauge (G): 22  Length: 3.5-in  Qty: 3  Pre-op H&P Assessment:  Stacy Martinez is a 67 y.o. (year old), female patient, seen today for interventional treatment. 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 Stacy Martinez has a current medication list which includes the following prescription(s): acetaminophen, amlodipine, carvedilol, diclofenac, enalapril, fenofibrate, furosemide, gabapentin, relion glucose test strips, basaglar kwikpen, jardiance, ondansetron, relion ultra thin lancets, rosuvastatin, sitagliptin, tizanidine, tramadol, and ulticare mini pen needles. Her primarily concern today is the Back Pain (Lower left side)  Initial Vital Signs:  Pulse/HCG Rate: 76ECG Heart Rate: 74 Temp: 97.6 F (36.4 C) Resp: 18 BP: 129/60 SpO2: 97 %  BMI: Estimated body mass index is 43.9 kg/m as calculated from the following:   Height as of this encounter: '5\' 2"'$  (1.575 m).   Weight as of this encounter: 240 lb (108.9 kg).  Risk Assessment: Allergies: Reviewed. She is allergic to prednisone and metformin and related.  Allergy Precautions: None required Coagulopathies: Reviewed. None identified.  Blood-thinner therapy: None at this time Active Infection(s): Reviewed. None identified. Stacy Martinez is afebrile  Site  Confirmation: Stacy Martinez was asked to confirm the procedure and laterality before marking the site Procedure checklist: Completed Consent: Before the procedure and under the influence of no sedative(s), amnesic(s), or anxiolytics, the patient was informed of the treatment options, risks and possible complications. To fulfill our ethical and legal obligations, as recommended by the American Medical Association's Code of Ethics, I have informed the patient of my clinical impression; the nature and purpose of the treatment or procedure; the risks, benefits, and possible complications of the intervention; the alternatives, including doing nothing; the risk(s) and benefit(s) of the alternative treatment(s) or procedure(s); and the risk(s) and benefit(s) of doing nothing. The patient was provided information about the general risks and possible complications associated with the procedure. These may include, but are not limited to: failure to achieve desired goals, infection, bleeding, organ or nerve damage, allergic reactions, paralysis, and death. In addition, the patient was informed of those risks and complications associated to the procedure, such as failure to decrease pain; infection; bleeding; organ or nerve damage with subsequent damage to sensory, motor, and/or autonomic systems, resulting in permanent pain, numbness, and/or weakness of one or several areas of the body; allergic reactions; (i.e.: anaphylactic reaction); and/or death. Furthermore, the patient was informed of those risks and complications associated with the medications. These include, but are not limited to: allergic reactions (i.e.: anaphylactic or anaphylactoid reaction(s)); adrenal axis suppression; blood sugar elevation that in diabetics may result in ketoacidosis or comma; water retention that in patients with history of congestive heart failure may result in shortness of breath, pulmonary edema, and decompensation with resultant heart  failure; weight gain; swelling or edema; medication-induced neural toxicity; particulate matter embolism and blood vessel occlusion with resultant organ, and/or nervous system infarction; and/or aseptic necrosis of one or more joints. Finally, the patient was informed that Medicine is not an exact science; therefore, there is also the possibility of unforeseen or unpredictable risks and/or possible complications  that may result in a catastrophic outcome. The patient indicated having understood very clearly. We have given the patient no guarantees and we have made no promises. Enough time was given to the patient to ask questions, all of which were answered to the patient's satisfaction. Ms. Nalle has indicated that she wanted to continue with the procedure. Attestation: I, the ordering provider, attest that I have discussed with the patient the benefits, risks, side-effects, alternatives, likelihood of achieving goals, and potential problems during recovery for the procedure that I have provided informed consent. Date  Time: 10/29/2022  8:16 AM  Pre-Procedure Preparation:  Monitoring: As per clinic protocol. Respiration, ETCO2, SpO2, BP, heart rate and rhythm monitor placed and checked for adequate function Safety Precautions: Patient was assessed for positional comfort and pressure points before starting the procedure. Time-out: I initiated and conducted the "Time-out" before starting the procedure, as per protocol. The patient was asked to participate by confirming the accuracy of the "Time Out" information. Verification of the correct person, site, and procedure were performed and confirmed by me, the nursing staff, and the patient. "Time-out" conducted as per Joint Commission's Universal Protocol (UP.01.01.01). Time: 0910  Description/Narrative of Procedure:          Rationale (medical necessity): procedure needed and proper for the diagnosis and/or treatment of the patient's medical symptoms and  needs. Procedural Technique Safety Precautions: Aspiration looking for blood return was conducted prior to all injections. At no point did we inject any substances, as a needle was being advanced. No attempts were made at seeking any paresthesias. Safe injection practices and needle disposal techniques used. Medications properly checked for expiration dates. SDV (single dose vial) medications used. Description of the Procedure: Protocol guidelines were followed. The patient was assisted into a comfortable position. The target area was identified and the area prepped in the usual manner. Skin & deeper tissues infiltrated with local anesthetic. Appropriate amount of time allowed to pass for local anesthetics to take effect. The procedure needles were then advanced to the target area. Proper needle placement secured. Negative aspiration confirmed. Solution injected in intermittent fashion, asking for systemic symptoms every 0.5cc of injectate. The needles were then removed and the area cleansed, making sure to leave some of the prepping solution back to take advantage of its long term bactericidal properties.  12 cc solution made of 11 cc of 0.2% ropivacaine, 1 cc of Decadron 10 mg/cc, 2 cc injected for each genicular nerve bilaterally              Vitals:   10/29/22 0915 10/29/22 0920 10/29/22 0927 10/29/22 0933  BP: (!) 126/59 136/63  122/64  Pulse:    71  Resp: '11 15 15 16  '$ Temp:    (!) 97 F (36.1 C)  TempSrc:      SpO2: 98% 97% 96% 98%  Weight:      Height:         Start Time: 0910 hrs. End Time: 0927 hrs.  Imaging Guidance (Non-Spinal):          Type of Imaging Technique: Fluoroscopy Guidance (Non-Spinal) Indication(s): Assistance in needle guidance and placement for procedures requiring needle placement in or near specific anatomical locations not easily accessible without such assistance. Exposure Time: Please see nurses notes. Contrast: None used. Fluoroscopic Guidance: I was  personally present during the use of fluoroscopy. "Tunnel Vision Technique" used to obtain the best possible view of the target area. Parallax error corrected before commencing the procedure. "Direction-depth-direction" technique  used to introduce the needle under continuous pulsed fluoroscopy. Once target was reached, antero-posterior, oblique, and lateral fluoroscopic projection used confirm needle placement in all planes. Images permanently stored in EMR. Interpretation: No contrast injected. I personally interpreted the imaging intraoperatively. Adequate needle placement confirmed in multiple planes. Permanent images saved into the patient's record.  Post-operative Assessment:  Post-procedure Vital Signs:  Pulse/HCG Rate: 7167 Temp: (!) 97 F (36.1 C) Resp: 16 BP: 122/64 SpO2: 98 %  EBL: None  Complications: No immediate post-treatment complications observed by team, or reported by patient.  Note: The patient tolerated the entire procedure well. A repeat set of vitals were taken after the procedure and the patient was kept under observation following institutional policy, for this type of procedure. Post-procedural neurological assessment was performed, showing return to baseline, prior to discharge. The patient was provided with post-procedure discharge instructions, including a section on how to identify potential problems. Should any problems arise concerning this procedure, the patient was given instructions to immediately contact us, at any time, without hesitation. In any case, we plan to contact the patient by telephone for a follow-up status report regarding this interventional procedure.  Comments:  No additional relevant information.  Plan of Care (POC)  Orders:  Orders Placed This Encounter  Procedures   DG PAIN [facility] C-ARM 1-60 MIN NO REPORT    Intraoperative interpretation by procedural physician at [facility].    Standing Status:   Standing    Number of  Occurrences:   1    Order Specific Question:   Reason for exam:    Answer:   Assistance in needle guidance and placement for procedures requiring needle placement in or near specific anatomical locations not easily accessible without such assistance.     Medications ordered for procedure: Meds ordered this encounter  Medications   lidocaine (XYLOCAINE) 2 % (with pres) injection 400 mg   lactated ringers infusion   midazolam (VERSED) injection 0.5-2 mg    Make sure Flumazenil is available in the pyxis when using this medication. If oversedation occurs, administer 0.2 mg IV over 15 sec. If after 45 sec no response, administer 0.2 mg again over 1 min; may repeat at 1 min intervals; not to exceed 4 doses (1 mg)   dexamethasone (DECADRON) injection 10 mg   dexamethasone (DECADRON) injection 10 mg   ropivacaine (PF) 2 mg/mL (0.2%) (NAROPIN) injection 9 mL   ropivacaine (PF) 2 mg/mL (0.2%) (NAROPIN) injection 9 mL   Medications administered: We administered lidocaine, lactated ringers, midazolam, dexamethasone, dexamethasone, ropivacaine (PF) 2 mg/mL (0.2%), and ropivacaine (PF) 2 mg/mL (0.2%).  See the medical record for exact dosing, route, and time of administration.  Follow-up plan:   Return for [place].Marland Kitchen      Recent Visits Date Type Provider Dept  10/02/22 Office Visit Gillis Santa, MD [facility]  Showing recent visits within past 90 days and meeting all other requirements Today's Visits Date Type Provider Dept  10/29/22 Procedure visit Gillis Santa, MD [facility]  Showing today's visits and meeting all other requirements Future Appointments Date Type Provider Dept  11/26/22 Appointment Gillis Santa, MD [facility]  Showing future appointments within next 90 days and meeting all other requirements  Disposition: Discharge home  Discharge (Date  Time): 10/29/2022; 0936 hrs.   Primary Care Physician: Jearld Fenton,  NP Location: [facility] Note by: Gillis Santa, MD (TTS technology used. I apologize for any typographical  errors that were not detected and corrected.) Date: 10/29/2022; Time: 9:57 AM  Disclaimer:  Medicine is not an Chief Strategy Officer. The only guarantee in medicine is that nothing is guaranteed. It is important to note that the decision to proceed with this intervention was based on the information collected from the patient. The Data and conclusions were drawn from the patient's questionnaire, the interview, and the physical examination. Because the information was provided in large part by the patient, it cannot be guaranteed that it has not been purposely or unconsciously manipulated. Every effort has been made to obtain as much relevant data as possible for this evaluation. It is important to note that the conclusions that lead to this procedure are derived in large part from the available data. Always take into account that the treatment will also be dependent on availability of resources and existing treatment guidelines, considered by other Pain Management Practitioners as being common knowledge and practice, at the time of the intervention. For Medico-Legal purposes, it is also important to point out that variation in procedural techniques and pharmacological choices are the acceptable norm. The indications, contraindications, technique, and results of the above procedure should only be interpreted and judged by a Board-Certified Interventional Pain Specialist with extensive familiarity and expertise in the same exact procedure and technique.

## 2022-10-29 NOTE — Patient Instructions (Signed)

## 2022-10-30 NOTE — Telephone Encounter (Signed)
Called PP Denies any needs at this time. "It feels Wonderful"

## 2022-11-19 ENCOUNTER — Ambulatory Visit: Payer: Medicare Other | Admitting: Student in an Organized Health Care Education/Training Program

## 2022-11-25 ENCOUNTER — Other Ambulatory Visit: Payer: Self-pay | Admitting: Internal Medicine

## 2022-11-25 ENCOUNTER — Other Ambulatory Visit: Payer: Self-pay | Admitting: Student in an Organized Health Care Education/Training Program

## 2022-11-25 DIAGNOSIS — E1142 Type 2 diabetes mellitus with diabetic polyneuropathy: Secondary | ICD-10-CM

## 2022-11-25 DIAGNOSIS — I10 Essential (primary) hypertension: Secondary | ICD-10-CM

## 2022-11-26 ENCOUNTER — Ambulatory Visit
Payer: Medicare Other | Attending: Student in an Organized Health Care Education/Training Program | Admitting: Student in an Organized Health Care Education/Training Program

## 2022-11-26 ENCOUNTER — Encounter: Payer: Self-pay | Admitting: Student in an Organized Health Care Education/Training Program

## 2022-11-26 ENCOUNTER — Ambulatory Visit
Admission: RE | Admit: 2022-11-26 | Discharge: 2022-11-26 | Disposition: A | Discharge status: Home / Self Care | Payer: Medicare Other | Source: Ambulatory Visit | Attending: Student in an Organized Health Care Education/Training Program | Admitting: Student in an Organized Health Care Education/Training Program

## 2022-11-26 DIAGNOSIS — M47816 Spondylosis without myelopathy or radiculopathy, lumbar region: Secondary | ICD-10-CM | POA: Diagnosis present

## 2022-11-26 DIAGNOSIS — G894 Chronic pain syndrome: Secondary | ICD-10-CM | POA: Diagnosis present

## 2022-11-26 MED ORDER — DEXAMETHASONE SODIUM PHOSPHATE 10 MG/ML IJ SOLN
INTRAMUSCULAR | Status: AC
  Filled 2022-11-26: qty 1

## 2022-11-26 MED ORDER — MIDAZOLAM HCL 2 MG/2ML IJ SOLN
0.5000 mg | Freq: Once | INTRAMUSCULAR | Status: AC
  Administered 2022-11-26: 2 mg via INTRAVENOUS

## 2022-11-26 MED ORDER — MIDAZOLAM HCL 2 MG/2ML IJ SOLN
INTRAMUSCULAR | Status: AC
  Filled 2022-11-26: qty 2

## 2022-11-26 MED ORDER — ROPIVACAINE HCL 2 MG/ML IJ SOLN
INTRAMUSCULAR | Status: AC
  Filled 2022-11-26: qty 20

## 2022-11-26 MED ORDER — LIDOCAINE HCL 2 % IJ SOLN
20.0000 mL | Freq: Once | INTRAMUSCULAR | Status: AC
  Administered 2022-11-26: 100 mg

## 2022-11-26 MED ORDER — DEXAMETHASONE SODIUM PHOSPHATE 10 MG/ML IJ SOLN
10.0000 mg | Freq: Once | INTRAMUSCULAR | Status: AC
  Administered 2022-11-26: 10 mg

## 2022-11-26 MED ORDER — LACTATED RINGERS IV SOLN: Freq: Once | INTRAVENOUS | Status: AC

## 2022-11-26 MED ORDER — ROPIVACAINE HCL 2 MG/ML IJ SOLN
9.0000 mL | Freq: Once | INTRAMUSCULAR | Status: AC
  Administered 2022-11-26: 9 mL via PERINEURAL

## 2022-11-26 MED ORDER — IOHEXOL 180 MG/ML  SOLN: 10.0000 mL | Freq: Once | INTRAMUSCULAR | Status: DC

## 2022-11-26 MED ORDER — LIDOCAINE HCL (PF) 2 % IJ SOLN
INTRAMUSCULAR | Status: AC
  Filled 2022-11-26: qty 10

## 2022-11-26 NOTE — Patient Instructions (Signed)

## 2022-11-26 NOTE — Telephone Encounter (Signed)
Requested Prescriptions  Pending Prescriptions Disp Refills   furosemide (LASIX) 40 MG tablet [Pharmacy Med Name: Furosemide 40 MG Oral Tablet] 90 tablet 0    Sig: Take 1 tablet by mouth once daily     Cardiovascular:  Diuretics - Loop Failed - 11/25/2022 11:45 AM      Failed - Mg Level in normal range and within 180 days    No results found for: "MG"       Passed - K in normal range and within 180 days    Potassium  Date Value Ref Range Status  08/29/2022 5.0 3.5 - 5.3 mmol/L Final         Passed - Ca in normal range and within 180 days    Calcium  Date Value Ref Range Status  08/29/2022 9.6 8.6 - 10.4 mg/dL Final         Passed - Na in normal range and within 180 days    Sodium  Date Value Ref Range Status  08/29/2022 138 135 - 146 mmol/L Final  11/30/2015 136 134 - 144 mmol/L Final         Passed - Cr in normal range and within 180 days    Creat  Date Value Ref Range Status  08/29/2022 0.78 0.50 - 1.05 mg/dL Final   Creatinine, Urine  Date Value Ref Range Status  08/29/2022 35 20 - 275 mg/dL Final         Passed - Cl in normal range and within 180 days    Chloride  Date Value Ref Range Status  08/29/2022 104 98 - 110 mmol/L Final         Passed - Last BP in normal range    BP Readings from Last 1 Encounters:  11/26/22 134/64         Passed - Valid encounter within last 6 months    Recent Outpatient Visits           2 months ago DM type 2 with diabetic peripheral neuropathy [facility])   [facility] [place], Coralie Keens, NP   10 months ago DM type 2 with diabetic peripheral neuropathy [facility])   [facility] [place], Coralie Keens, NP   1 year ago DM type 2 with diabetic peripheral neuropathy [facility])   [facility] [place], Coralie Keens, NP   1 year ago Chronic right hip pain   [facility] [place], [place] W, NP   1 year ago DM type 2 with diabetic peripheral neuropathy [facility])    [facility] [place], [place] W, NP               gabapentin (NEURONTIN) 300 MG capsule [Pharmacy Med Name: Gabapentin 300 MG Oral Capsule] 180 capsule 0    Sig: Take 1 capsule by mouth twice daily     Neurology: Anticonvulsants - gabapentin Passed - 11/25/2022 11:45 AM      Passed - Cr in normal range and within 360 days    Creat  Date Value Ref Range Status  08/29/2022 0.78 0.50 - 1.05 mg/dL Final   Creatinine, Urine  Date Value Ref Range Status  08/29/2022 35 20 - 275 mg/dL Final         Passed - Completed PHQ-2 or PHQ-9 in the last 360 days      Passed - Valid encounter within last 12 months    Recent Outpatient Visits  2 months ago DM type 2 with diabetic peripheral neuropathy [facility])   [facility] [place], [place], [place]   10 months ago DM type 2 with diabetic peripheral neuropathy [facility])   [facility] [place], Coralie Keens, NP   1 year ago DM type 2 with diabetic peripheral neuropathy [facility])   [facility] [place], Coralie Keens, NP   1 year ago Chronic right hip pain   [facility] [address], Coralie Keens, NP   1 year ago DM type 2 with diabetic peripheral neuropathy [facility])   [facility] [place], Coralie Keens, NP               JARDIANCE 25 MG TABS tablet [Pharmacy Med Name: Jardiance 25 MG Oral Tablet] 90 tablet 0    Sig: Take 1 tablet by mouth once daily     Endocrinology:  Diabetes - SGLT2 Inhibitors Failed - 11/25/2022 11:45 AM      Failed - HBA1C is between 0 and 7.9 and within 180 days    Hemoglobin A1C  Date Value Ref Range Status  01/06/2017 8.0  Final   Hgb A1c MFr Bld  Date Value Ref Range Status  08/29/2022 8.5 (H) <5.7 % of total Hgb Final    Comment:    For someone without known diabetes, a hemoglobin A1c value of 6.5% or greater indicates that they may have  diabetes and this should be confirmed with  a follow-up  test. . For someone with known diabetes, a value <7% indicates  that their diabetes is well controlled and a value  greater than or equal to 7% indicates suboptimal  control. A1c targets should be individualized based on  duration of diabetes, age, comorbid conditions, and  other considerations. . Currently, no consensus exists regarding use of hemoglobin A1c for diagnosis of diabetes for children. .          Passed - Cr in normal range and within 360 days    Creat  Date Value Ref Range Status  08/29/2022 0.78 0.50 - 1.05 mg/dL Final   Creatinine, Urine  Date Value Ref Range Status  08/29/2022 35 20 - 275 mg/dL Final         Passed - eGFR in normal range and within 360 days    GFR, Est African American  Date Value Ref Range Status  03/22/2019 102 > OR = 60 mL/min/1.66m2 Final   GFR, Est Non African American  Date Value Ref Range Status  03/22/2019 88 > OR = 60 mL/min/1.30m2 Final   GFR, Estimated  Date Value Ref Range Status  11/30/2021 >60 >60 mL/min Final    Comment:    (NOTE) Calculated using the CKD-EPI Creatinine Equation (2021)    eGFR  Date Value Ref Range Status  08/29/2022 84 > OR = 60 mL/min/1.92m2 Final         Passed - Valid encounter within last 6 months    Recent Outpatient Visits           2 months ago DM type 2 with diabetic peripheral neuropathy [facility])   [facility] [place], [place] W, NP   10 months ago DM type 2 with diabetic peripheral neuropathy [facility])   [facility] [place], [place] W, NP   1 year ago DM type 2 with diabetic peripheral neuropathy [facility])   [facility] [place], Coralie Keens, NP   1 year ago Chronic right hip pain   [facility] [place], [place], NP   1 year ago DM type 2 with diabetic peripheral neuropathy [facility])   [facility] [place], Coralie Keens, NP               sitaGLIPtin  (JANUVIA) 100 MG tablet [Pharmacy Med Name: Januvia 100 MG Oral Tablet] 90 tablet 0    Sig: Take 1 tablet by mouth once daily     Endocrinology:  Diabetes - DPP-4 Inhibitors Failed - 11/25/2022 11:45 AM      Failed - HBA1C is between 0 and 7.9 and within 180 days    Hemoglobin A1C  Date Value Ref Range Status  01/06/2017 8.0  Final   Hgb A1c MFr Bld  Date Value Ref Range Status  08/29/2022 8.5 (H) <5.7 % of total Hgb Final    Comment:    For someone without known diabetes, a hemoglobin A1c value of 6.5% or greater indicates that they may have  diabetes and this should be confirmed with a follow-up  test. . For someone with known diabetes, a value <7% indicates  that their diabetes is well controlled and a value  greater than or equal to 7% indicates suboptimal  control. A1c targets should be individualized based on  duration of diabetes, age, comorbid conditions, and  other considerations. . Currently, no consensus exists regarding use of hemoglobin A1c for diagnosis of diabetes for children. .          Passed - Cr in normal range and within 360 days    Creat  Date Value Ref Range Status  08/29/2022 0.78 0.50 - 1.05 mg/dL Final   Creatinine, Urine  Date Value Ref Range Status  08/29/2022 35 20 - 275 mg/dL Final         Passed - Valid encounter within last 6 months    Recent Outpatient Visits           2 months ago DM type 2 with diabetic peripheral neuropathy [facility])   [facility] [place], [place] W, NP   10 months ago DM type 2 with diabetic peripheral neuropathy [facility])   [facility] [place], Coralie Keens, NP   1 year ago DM type 2 with diabetic peripheral neuropathy [facility])   [facility] [place], Coralie Keens, NP   1 year ago Chronic right hip pain   [facility] [place], [place] W, NP   1 year ago DM type 2 with diabetic peripheral neuropathy [facility])   [facility] [place], Coralie Keens, NP               amLODipine (NORVASC) 10 MG tablet [Pharmacy Med Name: amLODIPine Besylate 10 MG Oral Tablet] 90 tablet 0    Sig: Take 1 tablet by mouth once daily     Cardiovascular: Calcium Channel Blockers 2 Passed - 11/25/2022 11:45 AM      Passed - Last BP in normal range    BP Readings from Last 1 Encounters:  11/26/22 134/64         Passed - Last Heart Rate in normal range    Pulse Readings from Last 1 Encounters:  11/26/22 70         Passed - Valid encounter within last 6 months  Recent Outpatient Visits           2 months ago DM type 2 with diabetic peripheral neuropathy [facility])   [facility] [place], [place], NP   10 months ago DM type 2 with diabetic peripheral neuropathy [facility])   [facility] [place], Coralie Keens, NP   1 year ago DM type 2 with diabetic peripheral neuropathy [facility])   [facility] [place], Coralie Keens, NP   1 year ago Chronic right hip pain   [facility] [place], Coralie Keens, NP   1 year ago DM type 2 with diabetic peripheral neuropathy [facility])   [facility] [place], Coralie Keens, NP               carvedilol (COREG) 25 MG tablet [Pharmacy Med Name: Carvedilol 25 MG Oral Tablet] 180 tablet 0    Sig: TAKE 1 TABLET BY MOUTH TWICE DAILY WITH MEALS     Cardiovascular: Beta Blockers 3 Passed - 11/25/2022 11:45 AM      Passed - Cr in normal range and within 360 days    Creat  Date Value Ref Range Status  08/29/2022 0.78 0.50 - 1.05 mg/dL Final   Creatinine, Urine  Date Value Ref Range Status  08/29/2022 35 20 - 275 mg/dL Final         Passed - AST in normal range and within 360 days    AST  Date Value Ref Range Status  08/29/2022 14 10 - 35 U/L Final         Passed - ALT in normal range and within 360 days    ALT  Date Value Ref Range Status  08/29/2022 14 6 - 29 U/L Final          Passed - Last BP in normal range    BP Readings from Last 1 Encounters:  11/26/22 134/64         Passed - Last Heart Rate in normal range    Pulse Readings from Last 1 Encounters:  11/26/22 70         Passed - Valid encounter within last 6 months    Recent Outpatient Visits           2 months ago DM type 2 with diabetic peripheral neuropathy [facility])   [facility] [place], Coralie Keens, NP   10 months ago DM type 2 with diabetic peripheral neuropathy [facility])   [facility] [place], [place], NP   1 year ago DM type 2 with diabetic peripheral neuropathy [facility])   [facility] [place], Coralie Keens, NP   1 year ago Chronic right hip pain   [facility] [place], [place], NP   1 year ago DM type 2 with diabetic peripheral neuropathy [facility])   [facility] [place], [place]alie Keens, [place]

## 2022-11-26 NOTE — Progress Notes (Signed)
Safety precautions to be maintained throughout the outpatient stay will include: orient to surroundings, keep bed in low position, maintain call bell within reach at all times, provide assistance with transfer out of bed and ambulation.  

## 2022-11-26 NOTE — Progress Notes (Signed)
PROVIDER NOTE: Information contained herein reflects review and annotations entered in association with encounter. Interpretation of such information and data should be left to medically-trained personnel. Information provided to patient can be located elsewhere in the medical record under "Patient Instructions". Document created using STT-dictation technology, any transcriptional errors that may result from process are unintentional.    Patient: Stacy Martinez  Service Category: Procedure  Provider: Gillis Santa, MD  DOB: [birth year]  DOS: 11/26/2022  Location: [facility]  MRN: [id number]  Setting: Ambulatory - outpatient  Referring Provider: Gillis Santa, MD  Type: Established Patient  Specialty: Interventional Pain Management  PCP: Jearld Fenton, NP   Primary Reason for Visit: Interventional Pain Management Treatment. CC: Back Pain (Lower worse on left)   Procedure:          Anesthesia, Analgesia, Anxiolysis:  Type: Thermal Lumbar Facet, Medial Branch Radiofrequency Ablation/Neurotomy           Primary Purpose: Therapeutic Region: Posterolateral Lumbosacral Spine Level: L3, L4, L5, Medial Branch Level(s). These levels will denervate the L3-4, L4-5, lumbar facet joints. Laterality: Right  Type:  minimal sedation  IV Versed Indication(s): Anxiety Route: Intravenous (IV) IV Access: Secured Sedation: Meaningful verbal contact was maintained at all times during the procedure  Local Anesthetic: Lidocaine 1-2%  Position: Prone   Indications: 1. Facet syndrome, lumbar   2. Lumbar spondylosis   3. Spondylosis without myelopathy or radiculopathy, lumbar region   4. Chronic pain syndrome    Stacy Martinez has been dealing with the above chronic pain for longer than three months and has either failed to respond, was unable to tolerate, or simply did not get enough benefit from other more conservative therapies including, but not limited to: 1. Over-the-counter medications 2.  Anti-inflammatory medications 3. Muscle relaxants 4. Membrane stabilizers 5. Opioids 6. Physical therapy and/or chiropractic manipulation 7. Modalities (Heat, ice, etc.) 8. Invasive techniques such as nerve blocks. Stacy Martinez has attained more than 50% relief of the pain from a series of diagnostic injections conducted in separate occasions.  Pain Score: Pre-procedure: 8 /10 Post-procedure: 8 /10  Pre-op H&P Assessment:  Stacy Martinez is a 67 y.o. (year old), female patient, seen today for interventional treatment. 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 Stacy Martinez has a current medication list which includes the following prescription(s): acetaminophen, diclofenac, enalapril, fenofibrate, relion glucose test strips, basaglar kwikpen, ondansetron, relion ultra thin lancets, rosuvastatin, tizanidine, ulticare mini pen needles, amlodipine, carvedilol, furosemide, gabapentin, jardiance, and sitagliptin, and the following Facility-Administered Medications: iohexol and lactated ringers. Her primarily concern today is the Back Pain (Lower worse on left)   Initial Vital Signs:  Pulse/HCG Rate: 70ECG Heart Rate: 72 Temp: (!) 97.4 F (36.3 C) Resp: 18 BP: 134/64 SpO2: 100 %  BMI: Estimated body mass index is 43.9 kg/m as calculated from the following:   Height as of this encounter: 5\' 2"  (1.575 m).   Weight as of this encounter: 240 lb (108.9 kg).  Risk Assessment: Allergies: Reviewed. She is allergic to prednisone and metformin and related.  Allergy Precautions: None required Coagulopathies: Reviewed. None identified.  Blood-thinner therapy: None at this time Active Infection(s): Reviewed. None identified. Stacy Martinez is afebrile  Site Confirmation: Stacy Martinez was asked to confirm the  procedure and laterality before marking the site Procedure checklist: Completed Consent:  Before the procedure and under the influence of no sedative(s), amnesic(s), or anxiolytics, the patient was informed of the treatment options, risks and possible complications. To fulfill our ethical and legal obligations, as recommended by the American Medical Association's Code of Ethics, I have informed the patient of my clinical impression; the nature and purpose of the treatment or procedure; the risks, benefits, and possible complications of the intervention; the alternatives, including doing nothing; the risk(s) and benefit(s) of the alternative treatment(s) or procedure(s); and the risk(s) and benefit(s) of doing nothing. The patient was provided information about the general risks and possible complications associated with the procedure. These may include, but are not limited to: failure to achieve desired goals, infection, bleeding, organ or nerve damage, allergic reactions, paralysis, and death. In addition, the patient was informed of those risks and complications associated to Spine-related procedures, such as failure to decrease pain; infection (i.e.: Meningitis, epidural or intraspinal abscess); bleeding (i.e.: epidural hematoma, subarachnoid hemorrhage, or any other type of intraspinal or peri-dural bleeding); organ or nerve damage (i.e.: Any type of peripheral nerve, nerve root, or spinal cord injury) with subsequent damage to sensory, motor, and/or autonomic systems, resulting in permanent pain, numbness, and/or weakness of one or several areas of the body; allergic reactions; (i.e.: anaphylactic reaction); and/or death. Furthermore, the patient was informed of those risks and complications associated with the medications. These include, but are not limited to: allergic reactions (i.e.: anaphylactic or anaphylactoid reaction(s)); adrenal axis suppression; blood sugar elevation that in diabetics may result  in ketoacidosis or comma; water retention that in patients with history of congestive heart failure may result in shortness of breath, pulmonary edema, and decompensation with resultant heart failure; weight gain; swelling or edema; medication-induced neural toxicity; particulate matter embolism and blood vessel occlusion with resultant organ, and/or nervous system infarction; and/or aseptic necrosis of one or more joints. Finally, the patient was informed that Medicine is not an exact science; therefore, there is also the possibility of unforeseen or unpredictable risks and/or possible complications that may result in a catastrophic outcome. The patient indicated having understood very clearly. We have given the patient no guarantees and we have made no promises. Enough time was given to the patient to ask questions, all of which were answered to the patient's satisfaction. Stacy Martinez has indicated that she wanted to continue with the procedure. Attestation: I, the ordering provider, attest that I have discussed with the patient the benefits, risks, side-effects, alternatives, likelihood of achieving goals, and potential problems during recovery for the procedure that I have provided informed consent. Date  Time: 11/26/2022  9:55 AM  Pre-Procedure Preparation:  Monitoring: As per clinic protocol. Respiration, ETCO2, SpO2, BP, heart rate and rhythm monitor placed and checked for adequate function Safety Precautions: Patient was assessed for positional comfort and pressure points before starting the procedure. Time-out: I initiated and conducted the "Time-out" before starting the procedure, as per protocol. The patient was asked to participate by confirming the accuracy of the "Time Out" information. Verification of the correct person, site, and procedure were performed and confirmed by me, the nursing staff, and the patient. "Time-out" conducted as per Joint Commission's Universal Protocol  (UP.01.01.01). Time: 1039  Description of Procedure:          Laterality: Right Levels:  L3, L4, L5, Medial Branch Level(s), at the L3-4, L4-5, lumbar facet joints. Area Prepped: Lumbosacral DuraPrep (Iodine Povacrylex [0.7% available iodine] and Isopropyl Alcohol, 74% w/w) Safety Precautions: Aspiration looking for blood return was  conducted prior to all injections. At no point did we inject any substances, as a needle was being advanced. Before injecting, the patient was told to immediately notify me if she was experiencing any new onset of "ringing in the ears, or metallic taste in the mouth". No attempts were made at seeking any paresthesias. Safe injection practices and needle disposal techniques used. Medications properly checked for expiration dates. SDV (single dose vial) medications used. After the completion of the procedure, all disposable equipment used was discarded in the proper designated medical waste containers. Local Anesthesia: Protocol guidelines were followed. The patient was positioned over the fluoroscopy table. The area was prepped in the usual manner. The time-out was completed. The target area was identified using fluoroscopy. A 12-in long, straight, sterile hemostat was used with fluoroscopic guidance to locate the targets for each level blocked. Once located, the skin was marked with an approved surgical skin marker. Once all sites were marked, the skin (epidermis, dermis, and hypodermis), as well as deeper tissues (fat, connective tissue and muscle) were infiltrated with a small amount of a short-acting local anesthetic, loaded on a 10cc syringe with a 25G, 1.5-in  Needle. An appropriate amount of time was allowed for local anesthetics to take effect before proceeding to the next step. Local Anesthetic: Lidocaine 2.0% The unused portion of the local anesthetic was discarded in the proper designated containers. Technical explanation of process:   Radiofrequency Ablation  (RFA) L3 Medial Branch Nerve RFA: The target area for the L3 medial branch is at the junction of the postero-lateral aspect of the superior articular process and the superior, posterior, and medial edge of the transverse process of L4. Under fluoroscopic guidance, a Radiofrequency needle was inserted until contact was made with os over the superior postero-lateral aspect of the pedicular shadow (target area). Sensory and motor testing was conducted to properly adjust the position of the needle. Once satisfactory placement of the needle was achieved, the numbing solution was slowly injected after negative aspiration for blood. 2.0 mL of the nerve block solution was injected without difficulty or complication. After waiting for at least 3 minutes, the ablation was performed. Once completed, the needle was removed intact. L4 Medial Branch Nerve RFA: The target area for the L4 medial branch is at the junction of the postero-lateral aspect of the superior articular process and the superior, posterior, and medial edge of the transverse process of L5. Under fluoroscopic guidance, a Radiofrequency needle was inserted until contact was made with os over the superior postero-lateral aspect of the pedicular shadow (target area). Sensory and motor testing was conducted to properly adjust the position of the needle. Once satisfactory placement of the needle was achieved, the numbing solution was slowly injected after negative aspiration for blood. 2.0 mL of the nerve block solution was injected without difficulty or complication. After waiting for at least 3 minutes, the ablation was performed. Once completed, the needle was removed intact. L5 Medial Branch Nerve RFA: The target area for the L5 medial branch is at the junction of the postero-lateral aspect of the superior articular process of S1 and the superior, posterior, and medial edge of the sacral ala. Under fluoroscopic guidance, a Radiofrequency needle was inserted  until contact was made with os over the superior postero-lateral aspect of the pedicular shadow (target area). Sensory and motor testing was conducted to properly adjust the position of the needle. Once satisfactory placement of the needle was achieved, the numbing solution was slowly injected after negative aspiration  for blood. 2.0 mL of the nerve block solution was injected without difficulty or complication. After waiting for at least 3 minutes, the ablation was performed. Once completed, the needle was removed intact.  Radiofrequency lesioning (ablation):  Radiofrequency Generator: NeuroTherm [id number] Sensory Stimulation Parameters: 50 Hz was used to locate & identify the nerve, making sure that the needle was positioned such that there was no sensory stimulation below 0.3 V or above 0.7 V. Motor Stimulation Parameters: 2 Hz was used to evaluate the motor component. Care was taken not to lesion any nerves that demonstrated motor stimulation of the lower extremities at an output of less than 2.5 times that of the sensory threshold, or a maximum of 2.0 V. Lesioning Technique Parameters: Standard Radiofrequency settings. (Not bipolar or pulsed.) Temperature Settings: 80 degrees C Lesioning time: 60 seconds Intra-operative Compliance: Compliant Materials & Medications: Needle(s) (Electrode/Cannula) Type: Teflon-coated, curved tip, Radiofrequency needle(s) Gauge: 22G Length: 10cm Numbing solution: 6 cc solution made of  5 cc of 0.2% ropivacaine, 1 cc of Decadron 10 mg/cc.  2 cc injected at each level above for  the right side after sensorimotor testing, prior to lesioning. Once the entire procedure was completed, the treated area was cleaned, making sure to leave some of the prepping solution back to take advantage of its long term bactericidal properties.    Illustration of the posterior view of the lumbar spine and the posterior neural structures. Laminae of L2 through S1 are labeled. DPRL5, dorsal  primary ramus of L5; DPRS1, dorsal primary ramus of S1; DPR3, dorsal primary ramus of L3; FJ, facet (zygapophyseal) joint L3-L4; I, inferior articular process of L4; LB1, lateral branch of dorsal primary ramus of L1; IAB, inferior articular branches from L3 medial branch (supplies L4-L5 facet joint); IBP, intermediate branch plexus; MB3, medial branch of dorsal primary ramus of L3; NR3, third lumbar nerve root; S, superior articular process of L5; SAB, superior articular branches from L4 (supplies L4-5 facet joint also); TP3, transverse process of L3.  Vitals:   11/26/22 1050 11/26/22 1055 11/26/22 1058 11/26/22 1106  BP: 138/82 138/70 (!) 141/77 126/64  Pulse:    73  Resp: 18 18 16 16   Temp:      TempSrc:      SpO2: 95% 95% 96% 99%  Weight:      Height:       Start Time: 1039 hrs. End Time: 1058 hrs.  Imaging Guidance (Spinal):          Type of Imaging Technique: Fluoroscopy Guidance (Spinal) Indication(s): Assistance in needle guidance and placement for procedures requiring needle placement in or near specific anatomical locations not easily accessible without such assistance. Exposure Time: Please see nurses notes. Contrast: None used. Fluoroscopic Guidance: I was personally present during the use of fluoroscopy. "Tunnel Vision Technique" used to obtain the best possible view of the target area. Parallax error corrected before commencing the procedure. "Direction-depth-direction" technique used to introduce the needle under continuous pulsed fluoroscopy. Once target was reached, antero-posterior, oblique, and lateral fluoroscopic projection used confirm needle placement in all planes. Images permanently stored in EMR. Interpretation: No contrast injected. I personally interpreted the imaging intraoperatively. Adequate needle placement confirmed in multiple planes. Permanent images saved into the patient's record.   Post-operative Assessment:  Post-procedure Vital Signs:  Pulse/HCG Rate:  7364 Temp: (!) 97.4 F (36.3 C) Resp: 16 BP: 126/64 SpO2: 99 %  EBL: None  Complications: No immediate post-treatment complications observed by team, or reported by patient.  Note:  The patient tolerated the entire procedure well. A repeat set of vitals were taken after the procedure and the patient was kept under observation following institutional policy, for this type of procedure. Post-procedural neurological assessment was performed, showing return to baseline, prior to discharge. The patient was provided with post-procedure discharge instructions, including a section on how to identify potential problems. Should any problems arise concerning this procedure, the patient was given instructions to immediately contact us, at any time, without hesitation. In any case, we plan to contact the patient by telephone for a follow-up status report regarding this interventional procedure.  Comments:  No additional relevant information.  Plan of Care  Orders:  Orders Placed This Encounter  Procedures   DG PAIN [facility] C-ARM 1-60 MIN NO REPORT    Intraoperative interpretation by procedural physician at [facility].    Standing Status:   Standing    Number of Occurrences:   1    Order Specific Question:   Reason for exam:    Answer:   Assistance in needle guidance and placement for procedures requiring needle placement in or near specific anatomical locations not easily accessible without such assistance.   Medications ordered for procedure: Meds ordered this encounter  Medications   iohexol (OMNIPAQUE) 180 MG/ML injection 10 mL    Must be Myelogram-compatible. If not available, you may substitute with a water-soluble, non-ionic, hypoallergenic, myelogram-compatible radiological contrast medium.   lidocaine (XYLOCAINE) 2 % (with pres) injection 400 mg   lactated ringers infusion   midazolam (VERSED) injection 0.5-2 mg    Make sure Flumazenil is available in the pyxis when using this  medication. If oversedation occurs, administer 0.2 mg IV over 15 sec. If after 45 sec no response, administer 0.2 mg again over 1 min; may repeat at 1 min intervals; not to exceed 4 doses (1 mg)   ropivacaine (PF) 2 mg/mL (0.2%) (NAROPIN) injection 9 mL   dexamethasone (DECADRON) injection 10 mg   Medications administered: We administered lidocaine, lactated ringers, midazolam, ropivacaine (PF) 2 mg/mL (0.2%), and dexamethasone.  See the medical record for exact dosing, route, and time of administration.  Follow-up plan:   Return in about 6 weeks (around 01/07/2023) for Post Procedure Evaluation, virtual.     Recent Visits Date Type Provider Dept  10/29/22 Procedure visit Gillis Santa, MD [facility]  10/02/22 Office Visit Gillis Santa, MD [facility]  Showing recent visits within past 90 days and meeting all other requirements Today's Visits Date Type Provider Dept  11/26/22 Procedure visit Gillis Santa, MD [facility]  Showing today's visits and meeting all other requirements Future Appointments Date Type Provider Dept  01/07/23 Appointment Gillis Santa, MD [facility]  Showing future appointments within next 90 days and meeting all other requirements  Disposition: Discharge home  Discharge (Date  Time): 11/26/2022; 1115 hrs.   Primary Care Physician: Jearld Fenton, NP Location: [facility] Note by: Gillis Santa, MD Date: 11/26/2022; Time: 11:16 AM  Disclaimer:  Medicine is not an exact science. The only guarantee in medicine is that nothing is guaranteed. It is important to note that the decision to proceed with this intervention was based on the information collected from the patient. The Data and conclusions were drawn from the patient's questionnaire, the interview, and the physical examination. Because the information was provided in large part by the patient, it cannot be guaranteed that it has not been  purposely or unconsciously manipulated. Every effort  has been made to obtain as much relevant data as possible for this evaluation. It is important to note that the conclusions that lead to this procedure are derived in large part from the available data. Always take into account that the treatment will also be dependent on availability of resources and existing treatment guidelines, considered by other Pain Management Practitioners as being common knowledge and practice, at the time of the intervention. For Medico-Legal purposes, it is also important to point out that variation in procedural techniques and pharmacological choices are the acceptable norm. The indications, contraindications, technique, and results of the above procedure should only be interpreted and judged by a Board-Certified Interventional Pain Specialist with extensive familiarity and expertise in the same exact procedure and technique.

## 2022-11-27 ENCOUNTER — Telehealth: Payer: Self-pay | Admitting: *Deleted

## 2022-11-27 NOTE — Telephone Encounter (Signed)
No problems post procedure. 

## 2022-12-17 ENCOUNTER — Telehealth: Payer: Self-pay | Admitting: Student in an Organized Health Care Education/Training Program

## 2022-12-17 NOTE — Telephone Encounter (Signed)
error 

## 2022-12-30 ENCOUNTER — Other Ambulatory Visit: Payer: Self-pay | Admitting: *Deleted

## 2022-12-30 ENCOUNTER — Telehealth: Payer: Self-pay | Admitting: Student in an Organized Health Care Education/Training Program

## 2022-12-30 DIAGNOSIS — G894 Chronic pain syndrome: Secondary | ICD-10-CM

## 2022-12-30 DIAGNOSIS — M47816 Spondylosis without myelopathy or radiculopathy, lumbar region: Secondary | ICD-10-CM

## 2022-12-30 DIAGNOSIS — M170 Bilateral primary osteoarthritis of knee: Secondary | ICD-10-CM

## 2022-12-30 MED ORDER — DICLOFENAC SODIUM 50 MG PO TBEC: 50.0000 mg | DELAYED_RELEASE_TABLET | Freq: Two times a day (BID) | ORAL | 0 refills | Status: DC

## 2022-12-30 MED ORDER — TIZANIDINE HCL 4 MG PO TABS: 4.0000 mg | ORAL_TABLET | Freq: Three times a day (TID) | ORAL | 0 refills | Status: DC | PRN

## 2022-12-30 NOTE — Telephone Encounter (Signed)
Rx request sent to Dr. Lateef 

## 2022-12-30 NOTE — Telephone Encounter (Signed)
PT stated that she was inform by the pharmacy that she didn't have any refills. Please give patient a call. TY

## 2023-01-07 ENCOUNTER — Encounter: Payer: Self-pay | Admitting: Student in an Organized Health Care Education/Training Program

## 2023-01-07 ENCOUNTER — Ambulatory Visit
Payer: Medicare Other | Attending: Student in an Organized Health Care Education/Training Program | Admitting: Student in an Organized Health Care Education/Training Program

## 2023-01-07 DIAGNOSIS — M47816 Spondylosis without myelopathy or radiculopathy, lumbar region: Secondary | ICD-10-CM | POA: Diagnosis not present

## 2023-01-07 DIAGNOSIS — G894 Chronic pain syndrome: Secondary | ICD-10-CM

## 2023-01-07 DIAGNOSIS — M170 Bilateral primary osteoarthritis of knee: Secondary | ICD-10-CM

## 2023-01-07 NOTE — Progress Notes (Signed)
Patient: Stacy Martinez  Service Category: E/M  Provider: Edward Jolly, MD  DOB: 04-25-1956  DOS: 01/07/2023  Location: Office  MRN: [id number]  Setting: Ambulatory outpatient  Referring Provider: Lorre Munroe, NP  Type: Established Patient  Specialty: Interventional Pain Management  PCP: Lorre Munroe, NP  Location: Remote location  Delivery: TeleHealth     Virtual Encounter - Pain Management PROVIDER NOTE: Information contained herein reflects review and annotations entered in association with encounter. Interpretation of such information and data should be left to medically-trained personnel. Information provided to patient can be located elsewhere in the medical record under "Patient Instructions". Document created using STT-dictation technology, any transcriptional errors that may result from process are unintentional.    Contact & Pharmacy Preferred: [phone] Home: [phone] (home) Mobile: [phone] (mobile) E-mail: [email or web address]  [facility] - [place], [place] - [address] [place] [address] Phone: [phone] Fax: [phone]   Pre-screening  [name] offered "in-person" vs "virtual" encounter. She indicated preferring virtual for this encounter.   Reason COVID-19*  Social distancing based on CDC and AMA recommendations.   I contacted Christin Hedding Yonker on 01/07/2023 via telephone.      I clearly identified myself as Edward Jolly, MD. I verified that I was speaking with the correct person using two identifiers (Name: Stacy Martinez, and date of birth: 1955-12-15).  Consent I sought verbal advanced consent from Laroy Apple for virtual visit interactions. I informed [name] of possible security and privacy concerns, risks, and limitations associated with providing "not-in-person" medical evaluation and management services. I also informed [name] of the availability of "in-person" appointments. Finally, I informed her that there would be a charge  for the virtual visit and that she could be  personally, fully or partially, financially responsible for it. Ms. Bolla expressed understanding and agreed to proceed.   Historic Elements   Stacy Martinez is a 67 y.o. year old, female patient evaluated today after our last contact on 12/30/2022. Stacy Martinez  has a past medical history of Allergy, Arthritis, Diabetes mellitus without complication (HCC), Enlarged heart, and Hypertension. 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 [name] has a current medication list which includes the following prescription(s): acetaminophen, amlodipine, carvedilol, diclofenac, enalapril, fenofibrate, furosemide, gabapentin, relion glucose test strips, basaglar kwikpen, jardiance, ondansetron, relion ultra thin lancets, rosuvastatin, sitagliptin, tizanidine, and ulticare mini pen needles. She  reports that she has never smoked. She has never used smokeless tobacco. She reports that she does not drink alcohol and does not use drugs. Stacy Martinez is allergic to prednisone and metformin and related.  BMI: Estimated body mass index is 43.9 kg/m as calculated from the following:   Height as of 11/26/22: 5\' 2"  (1.575 m).   Weight as of 11/26/22: 240 lb (108.9 kg). Last encounter: 10/02/2022. Last procedure: 11/26/2022.  HPI  Today, she is being contacted for a post-procedure assessment.   Post-procedure evaluation   Type: Genicular Nerves Block (Superolateral, Superomedial, and Inferomedial Genicular Nerves)  #1  Laterality: Bilateral (-50)  Level: Superior and inferior to the knee joint.  Imaging: Fluoroscopic guidance Anesthesia: Local anesthesia (1-2% Lidocaine) Sedation: Minimal Sedation IV Versed DOS: 10/29/2022  Performed by: Edward Jolly, MD  Left L3,4,5  RFA 10/29/22 Right L3,4,5 RFA 11/26/22   NAS-11 Pain  score:   Pre-procedure: 6 /10   Post-procedure: 0-No pain/10      Effectiveness:  Initial hour after procedure: 100 %  Subsequent 4-6 hours post-procedure: 100 %  Analgesia past initial 6 hours: 75% Ongoing improvement:  Analgesic:  50% Function: Stacy Martinez reports improvement in function ROM: Stacy Martinez reports improvement in ROM   Laboratory Chemistry Profile   Renal Lab Results  Component Value Date   BUN 18 08/29/2022   CREATININE 0.78 08/29/2022   LABCREA 35 08/29/2022   BCR SEE NOTE: 08/29/2022   GFRAA 102 03/22/2019   GFRNONAA >60 11/30/2021    Hepatic Lab Results  Component Value Date   AST 14 08/29/2022   ALT 14 08/29/2022   ALBUMIN 3.8 11/15/2021   ALKPHOS 95 11/15/2021   LIPASE 19 12/18/2013    Electrolytes Lab Results  Component Value Date   NA 138 08/29/2022   K 5.0 08/29/2022   CL 104 08/29/2022   CALCIUM 9.6 08/29/2022    Bone No results found for: "VD25OH", "VD125OH2TOT", "ZO1096EA5", "WU9811BJ4", "25OHVITD1", "25OHVITD2", "25OHVITD3", "TESTOFREE", "TESTOSTERONE"  Inflammation (CRP: Acute Phase) (ESR: Chronic Phase) Lab Results  Component Value Date   LATICACIDVEN 1.72 01/02/2014         Note: Above Lab results reviewed.   Assessment  The primary encounter diagnosis was Bilateral primary osteoarthritis of knee. Diagnoses of Chronic pain syndrome, Facet syndrome, lumbar, and Lumbar spondylosis were also pertinent to this visit.  Plan of Care  1. Bilateral primary osteoarthritis of knee -Positive diagnostic response to genicular nerve block.  Endorses 75% pain relief for approximately a week and a half with gradual return of her pain.  Currently experiencing approximately 50% pain relief.  We discussed genicular nerve radiofrequency ablation for the purpose of obtaining long-term benefit.  Risk and benefits reviewed and patient would like to proceed. - Radiofrequency,Genicular; Future  2.  Chronic pain syndrome -Continue with gabapentin and tizanidine as prescribed along with Voltaren as needed  3. Facet syndrome, lumbar -Approximately 50% pain relief after lumbar radiofrequency ablation, continue to monitor, repeat as needed  4. Lumbar spondylosis --Approximately 50% pain relief after lumbar radiofrequency ablation, continue to monitor, repeat as needed   Orders:  Orders Placed This Encounter  Procedures   Radiofrequency,Genicular    Standing Status:   Future    Standing Expiration Date:   04/09/2023    Scheduling Instructions:     B/L GN RFA     IV Versed    Order Specific Question:   Where will this procedure be performed?    Answer:   [facility]   Follow-up plan:   Return in about 26 days (around 02/02/2023) for B/L GN RFA, in clinic IV Versed.      Recent Visits Date Type Provider Dept  11/26/22 Procedure visit Edward Jolly, MD [facility]  10/29/22 Procedure visit Edward Jolly, MD [facility]  Showing recent visits within past 90 days and meeting all other requirements Today's Visits Date Type Provider Dept  01/07/23 Office Visit Edward Jolly, MD [facility]  Showing today's visits and meeting all other requirements Future Appointments Date Type Provider Dept  01/13/23 Appointment Edward Jolly, MD [facility]  Showing future appointments within next 90 days and meeting all other requirements  I discussed the assessment and treatment plan with the patient. The patient was provided an opportunity to ask questions and all were answered. The patient agreed with the plan and demonstrated an understanding of the instructions.  Patient advised to call back or seek an in-person evaluation if the symptoms or condition worsens.  Duration of encounter: .  Note by: Edward Jolly, MD Date: 01/07/2023; Time: 3:12 PM

## 2023-01-13 ENCOUNTER — Telehealth: Payer: Self-pay | Admitting: Student in an Organized Health Care Education/Training Program

## 2023-01-13 ENCOUNTER — Ambulatory Visit: Payer: Medicare Other | Admitting: Student in an Organized Health Care Education/Training Program

## 2023-01-13 NOTE — Telephone Encounter (Signed)
PT stated that when she spoke with nurse on 01-07-23 she was told that the appt that was scheduled for today will be cancel.

## 2023-01-25 ENCOUNTER — Other Ambulatory Visit: Payer: Self-pay | Admitting: Student in an Organized Health Care Education/Training Program

## 2023-01-25 DIAGNOSIS — G894 Chronic pain syndrome: Secondary | ICD-10-CM

## 2023-01-25 DIAGNOSIS — M47816 Spondylosis without myelopathy or radiculopathy, lumbar region: Secondary | ICD-10-CM

## 2023-01-25 DIAGNOSIS — M170 Bilateral primary osteoarthritis of knee: Secondary | ICD-10-CM

## 2023-02-02 ENCOUNTER — Other Ambulatory Visit: Payer: Self-pay | Admitting: Internal Medicine

## 2023-02-03 ENCOUNTER — Other Ambulatory Visit: Payer: Self-pay | Admitting: Internal Medicine

## 2023-02-03 NOTE — Telephone Encounter (Signed)
Requested Prescriptions  Pending Prescriptions Disp Refills   Insulin Glargine (BASAGLAR KWIKPEN) 100 UNIT/ML [Pharmacy Med Name: Basaglar KwikPen 100 UNIT/ML Subcutaneous Solution Pen-injector] 15 mL 0    Sig: INJECT 32 UNITS SUBCUTANEOUSLY ONCE DAILY     Endocrinology:  Diabetes - Insulins Failed - 02/02/2023  1:49 PM      Failed - HBA1C is between 0 and 7.9 and within 180 days    Hemoglobin A1C  Date Value Ref Range Status  01/06/2017 8.0  Final   Hgb A1c MFr Bld  Date Value Ref Range Status  08/29/2022 8.5 (H) <5.7 % of total Hgb Final    Comment:    For someone without known diabetes, a hemoglobin A1c value of 6.5% or greater indicates that they may have  diabetes and this should be confirmed with a follow-up  test. . For someone with known diabetes, a value <7% indicates  that their diabetes is well controlled and a value  greater than or equal to 7% indicates suboptimal  control. A1c targets should be individualized based on  duration of diabetes, age, comorbid conditions, and  other considerations. . Currently, no consensus exists regarding use of hemoglobin A1c for diagnosis of diabetes for children. [name] - Valid encounter within last 6 months    Recent Outpatient Visits           5 months ago DM type 2 with diabetic peripheral neuropathy [facility])   [place] [facility] [place], [place], NP   1 year ago DM type 2 with diabetic peripheral neuropathy [facility])   [place] [facility] [place], [name], NP   1 year ago DM type 2 with diabetic peripheral neuropathy [facility])   [place] [facility] [place], [name], NP   1 year ago Chronic right hip pain   [place] [facility] [place], [place], NP   2 years ago DM type 2 with diabetic peripheral neuropathy [facility])    [facility] [place], [name], [place]

## 2023-02-11 ENCOUNTER — Encounter: Payer: Self-pay | Admitting: Student in an Organized Health Care Education/Training Program

## 2023-02-11 ENCOUNTER — Ambulatory Visit
Payer: Medicare Other | Attending: Student in an Organized Health Care Education/Training Program | Admitting: Student in an Organized Health Care Education/Training Program

## 2023-02-11 ENCOUNTER — Ambulatory Visit
Admission: RE | Admit: 2023-02-11 | Discharge: 2023-02-11 | Disposition: A | Discharge status: Home / Self Care | Payer: Medicare Other | Source: Ambulatory Visit | Attending: Student in an Organized Health Care Education/Training Program | Admitting: Student in an Organized Health Care Education/Training Program

## 2023-02-11 VITALS — BP 133/53 | HR 68 | Temp 97.6°F | Resp 18 | Ht 62.0 in | Wt 240.0 lb

## 2023-02-11 DIAGNOSIS — G894 Chronic pain syndrome: Secondary | ICD-10-CM | POA: Insufficient documentation

## 2023-02-11 DIAGNOSIS — M47816 Spondylosis without myelopathy or radiculopathy, lumbar region: Secondary | ICD-10-CM | POA: Insufficient documentation

## 2023-02-11 DIAGNOSIS — M170 Bilateral primary osteoarthritis of knee: Secondary | ICD-10-CM

## 2023-02-11 MED ORDER — MIDAZOLAM HCL 2 MG/2ML IJ SOLN
0.5000 mg | Freq: Once | INTRAMUSCULAR | Status: AC
  Administered 2023-02-11: 2 mg via INTRAVENOUS

## 2023-02-11 MED ORDER — ROPIVACAINE HCL 2 MG/ML IJ SOLN
9.0000 mL | Freq: Once | INTRAMUSCULAR | Status: AC
  Administered 2023-02-11: 9 mL via PERINEURAL

## 2023-02-11 MED ORDER — ROPIVACAINE HCL 2 MG/ML IJ SOLN
INTRAMUSCULAR | Status: AC
  Filled 2023-02-11: qty 20

## 2023-02-11 MED ORDER — DEXAMETHASONE SODIUM PHOSPHATE 10 MG/ML IJ SOLN
10.0000 mg | Freq: Once | INTRAMUSCULAR | Status: AC
  Administered 2023-02-11: 10 mg

## 2023-02-11 MED ORDER — DICLOFENAC SODIUM 50 MG PO TBEC
50.0000 mg | DELAYED_RELEASE_TABLET | Freq: Two times a day (BID) | ORAL | 2 refills | Status: DC
Start: 2023-02-11 — End: 2023-10-21

## 2023-02-11 MED ORDER — LIDOCAINE HCL 2 % IJ SOLN
20.0000 mL | Freq: Once | INTRAMUSCULAR | Status: AC
  Administered 2023-02-11: 400 mg

## 2023-02-11 MED ORDER — LACTATED RINGERS IV SOLN: Freq: Once | INTRAVENOUS | Status: AC

## 2023-02-11 MED ORDER — DEXAMETHASONE SODIUM PHOSPHATE 10 MG/ML IJ SOLN
INTRAMUSCULAR | Status: AC
  Filled 2023-02-11: qty 2

## 2023-02-11 MED ORDER — MIDAZOLAM HCL 2 MG/2ML IJ SOLN
INTRAMUSCULAR | Status: AC
  Filled 2023-02-11: qty 2

## 2023-02-11 MED ORDER — TIZANIDINE HCL 4 MG PO TABS: 4.0000 mg | ORAL_TABLET | Freq: Three times a day (TID) | ORAL | 1 refills | Status: DC | PRN

## 2023-02-11 MED ORDER — TRAMADOL HCL 50 MG PO TABS: 50.0000 mg | ORAL_TABLET | Freq: Four times a day (QID) | ORAL | 1 refills | Status: AC | PRN

## 2023-02-11 MED ORDER — LIDOCAINE HCL 2 % IJ SOLN
INTRAMUSCULAR | Status: AC
  Filled 2023-02-11: qty 20

## 2023-02-11 NOTE — Progress Notes (Addendum)
PROVIDER NOTE: Interpretation of information contained herein should be left to medically-trained personnel. Specific patient instructions are provided elsewhere under "Patient Instructions" section of medical record. This document was created in part using STT-dictation technology, any transcriptional errors that may result from this process are unintentional.  Patient: Stacy Martinez Type: Established DOB: May 07, 1956 MRN: [id number] PCP: Lorre Munroe, NP  Service: Procedure DOS: 02/11/2023 Setting: Ambulatory Location: Ambulatory outpatient facility Delivery: Face-to-face Provider: Edward Jolly, MD Specialty: Interventional Pain Management Specialty designation: 09 Location: Outpatient facility Ref. Prov.: Lorre Munroe, NP       Interventional Therapy   Primary Reason for Visit: Interventional Pain Management Treatment. CC: Knee Pain (bilat) and Back Pain    Procedure:          Anesthesia, Analgesia, Anxiolysis:  Type: Therapeutic Superolateral, Superomedial, and Inferomedial, Genicular Nerve Radiofrequency Ablation (destruction).   #2  Region: Lateral, Anterior, and Medial aspects of the knee joint, above and below the knee joint proper. Level: Superior and inferior to the knee joint. Laterality: Bilateral  Anesthesia: Local (1-2% Lidocaine)  Anxiolysis: None  Sedation: None  Guidance: Fluoroscopy           Position: Supine   Indications: 1. Bilateral primary osteoarthritis of knee   2. Lumbar spondylosis   3. Chronic pain syndrome   4. Facet syndrome, lumbar    Stacy Martinez has been dealing with the above chronic pain for longer than three months and has either failed to respond, was unable to tolerate, or simply did not get enough benefit from other more conservative therapies including, but not limited to: 1. Over-the-counter medications 2. Anti-inflammatory medications 3. Muscle relaxants 4. Membrane stabilizers 5. Opioids 6. Physical therapy and/or chiropractic  manipulation 7. Modalities (Heat, ice, etc.) 8. Invasive techniques such as nerve blocks. Stacy Martinez has attained more than 50% relief of the pain from a series of diagnostic injections conducted in separate occasions.  Pain Score: Pre-procedure: 8 /10 Post-procedure: 0-No pain/10    Pre-op H&P Assessment:  Stacy Martinez is a 67 y.o. (year old), female patient, seen today for interventional treatment. 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 Stacy Martinez has a current medication list which includes the following prescription(s): acetaminophen, amlodipine, carvedilol, enalapril, fenofibrate, furosemide, gabapentin, relion glucose test strips, basaglar kwikpen, jardiance, ondansetron, relion ultra thin lancets, rosuvastatin, sitagliptin, ulticare mini pen needles, diclofenac, and tizanidine. Her primarily concern today is the Knee Pain (bilat) and Back Pain  Initial Vital Signs:  Pulse/HCG Rate: 68ECG Heart Rate: 70 Temp: 98.1 F (36.7 C) Resp: 17 BP: (!) 142/59 SpO2: 99 %  BMI: Estimated body mass index is 43.9 kg/m as calculated from the following:   Height as of this encounter: 5\' 2"  (1.575 m).   Weight as of this encounter: 240 lb (108.9 kg).  Risk Assessment: Allergies: Reviewed. She is allergic to prednisone and metformin and related.  Allergy Precautions: None required Coagulopathies: Reviewed. None identified.  Blood-thinner therapy: None at this time Active Infection(s): Reviewed. None identified. Stacy Martinez is afebrile  Site Confirmation: Stacy Martinez was asked to confirm the procedure and laterality before marking the site Procedure checklist: Completed Consent: Before the procedure and under the influence of no sedative(s), amnesic(s), or anxiolytics, the patient was  informed of the treatment options, risks and possible complications. To fulfill our ethical and legal  obligations, as recommended by the American Medical Association's Code of Ethics, I have informed the patient of my clinical impression; the nature and purpose of the treatment or procedure; the risks, benefits, and possible complications of the intervention; the alternatives, including doing nothing; the risk(s) and benefit(s) of the alternative treatment(s) or procedure(s); and the risk(s) and benefit(s) of doing nothing. The patient was provided information about the general risks and possible complications associated with the procedure. These may include, but are not limited to: failure to achieve desired goals, infection, bleeding, organ or nerve damage, allergic reactions, paralysis, and death. In addition, the patient was informed of those risks and complications associated to the procedure, such as failure to decrease pain; infection; bleeding; organ or nerve damage with subsequent damage to sensory, motor, and/or autonomic systems, resulting in permanent pain, numbness, and/or weakness of one or several areas of the body; allergic reactions; (i.e.: anaphylactic reaction); and/or death. Furthermore, the patient was informed of those risks and complications associated with the medications. These include, but are not limited to: allergic reactions (i.e.: anaphylactic or anaphylactoid reaction(s)); adrenal axis suppression; blood sugar elevation that in diabetics may result in ketoacidosis or comma; water retention that in patients with history of congestive heart failure may result in shortness of breath, pulmonary edema, and decompensation with resultant heart failure; weight gain; swelling or edema; medication-induced neural toxicity; particulate matter embolism and blood vessel occlusion with resultant organ, and/or nervous system infarction; and/or aseptic necrosis of one or more joints. Finally, the  patient was informed that Medicine is not an exact science; therefore, there is also the possibility of unforeseen or unpredictable risks and/or possible complications that may result in a catastrophic outcome. The patient indicated having understood very clearly. We have given the patient no guarantees and we have made no promises. Enough time was given to the patient to ask questions, all of which were answered to the patient's satisfaction. Ms. Carrell has indicated that she wanted to continue with the procedure. Attestation: I, the ordering provider, attest that I have discussed with the patient the benefits, risks, side-effects, alternatives, likelihood of achieving goals, and potential problems during recovery for the procedure that I have provided informed consent. Date  Time: 02/11/2023  1:38 PM  Pre-Procedure Preparation:  Monitoring: As per clinic protocol. Respiration, ETCO2, SpO2, BP, heart rate and rhythm monitor placed and checked for adequate function Safety Precautions: Patient was assessed for positional comfort and pressure points before starting the procedure. Time-out: I initiated and conducted the "Time-out" before starting the procedure, as per protocol. The patient was asked to participate by confirming the accuracy of the "Time Out" information. Verification of the correct person, site, and procedure were performed and confirmed by me, the nursing staff, and the patient. "Time-out" conducted as per Joint Commission's Universal Protocol (UP.01.01.01). Time: 1413 Start Time: 1413 hrs.  Description of Procedure:          Target Area: For Genicular Nerve radiofrequency ablation (destruction), the targets are: the superolateral genicular nerve, located in the lateral distal portion of the femoral shaft as it curves to form the lateral epicondyle, in the region of the distal femoral metaphysis; the superomedial genicular nerve, located in the medial distal portion of the femoral shaft as  it curves to form the medial epicondyle; and the inferomedial genicular nerve, located in the medial, proximal portion of the tibial shaft, as it curves to form the medial epicondyle, in the region of the proximal tibial metaphysis. Approach: Anterior, ipsilateral approach.  Area Prepped: Entire knee area, from mid-thigh to mid-shin, lateral, anterior, and medial aspects. DuraPrep (Iodine Povacrylex [0.7% available iodine] and Isopropyl Alcohol, 74% w/w) Safety Precautions: Aspiration looking for blood return was conducted prior to all injections. At no point did we inject any substances, as a needle was being advanced. No attempts were made at seeking any paresthesias. Safe injection practices and needle disposal techniques used. Medications properly checked for expiration dates. SDV (single dose vial) medications used. Description of the Procedure: Protocol guidelines were followed. The patient was placed in position over the procedure table. The target area was identified and the area prepped in the usual manner. The skin and muscle were infiltrated with local anesthetic. Appropriate amount of time allowed to pass for local anesthetics to take effect. Radiofrequency needles were introduced to the target area using fluoroscopic guidance. Using the NeuroTherm NT1100 Radiofrequency Generator, sensory stimulation using 50 Hz was used to locate & identify the nerve, making sure that the needle was positioned such that there was no sensory stimulation below 0.3 V or above 0.7 V. Stimulation using 2 Hz was used to evaluate the motor component. Care was taken not to lesion any nerves that demonstrated motor stimulation of the lower extremities at an output of less than 2.5 times that of the sensory threshold, or a maximum of 2.0 V. Once satisfactory placement of the needles was achieved, the numbing solution was slowly injected after negative aspiration. After waiting for at least 2 minutes, the ablation was  performed at 80 degrees C for 60 seconds, using regular Radiofrequency settings. Once the procedure was completed, the needles were then removed and the area cleansed, making sure to leave some of the prepping solution back to take advantage of its long term bactericidal properties. Intra-operative Compliance: Compliant      Vitals:   02/11/23 1438 02/11/23 1443 02/11/23 1445 02/11/23 1454  BP: (!) 116/59 (!) 125/55 (!) 145/66 (!) 133/53  Pulse:      Resp: 15 15 17 18   Temp:    97.6 F (36.4 C)  TempSrc:      SpO2: 98% 96% 98% 98%  Weight:      Height:        Start Time: 1413 hrs. End Time: 1445 hrs. Materials & Medications:  Needle(s) Type: Teflon-coated, curved tip, Radiofrequency needle(s) Gauge: 22G Length: 10cm Medication(s): Please see orders for medications and dosing details.  9 cc solution made of 8cc of 0.2% ropivacaine, 1 cc of Decadron 10 mg/cc. 3 cc injected at each level for the left genicular nerves after sensorimotor testing 9 cc solution made of 8cc of 0.2% ropivacaine, 1 cc of Decadron 10 mg/cc. 3 cc injected at each level for the right genicular nerves after sensorimotor testing  Imaging Guidance (Non-Spinal):          Type of Imaging Technique: Fluoroscopy Guidance (Non-Spinal) Indication(s): Assistance in needle guidance and placement for procedures requiring needle placement in or near specific anatomical locations not easily accessible without such assistance. Exposure Time: Please see nurses notes. Contrast: Before injecting any contrast, we confirmed that the patient did not have an allergy to iodine, shellfish, or radiological contrast. Once satisfactory needle placement was completed at the desired level, radiological contrast was injected. Contrast injected under live fluoroscopy. No contrast complications. See chart for type and volume of contrast used. Fluoroscopic Guidance: I was personally present during the use of fluoroscopy. "Tunnel Vision  Technique" used to obtain the best possible view of the target area. Parallax  error corrected before commencing the procedure. "Direction-depth-direction" technique used to introduce the needle under continuous pulsed fluoroscopy. Once target was reached, antero-posterior, oblique, and lateral fluoroscopic projection used confirm needle placement in all planes. Images permanently stored in EMR. Interpretation: I personally interpreted the imaging intraoperatively. Adequate needle placement confirmed in multiple planes. Appropriate spread of contrast into desired area was observed. No evidence of afferent or efferent intravascular uptake. Permanent images saved into the patient's record.  Antibiotic Prophylaxis:   Anti-infectives (From admission, onward)    None      Indication(s): None identified  Post-operative Assessment:  Post-procedure Vital Signs:  Pulse/HCG Rate: 6863 Temp: 97.6 F (36.4 C) Resp: 18 BP: (!) 133/53 SpO2: 98 %  EBL: None  Complications: No immediate post-treatment complications observed by team, or reported by patient.  Note: The patient tolerated the entire procedure well. A repeat set of vitals were taken after the procedure and the patient was kept under observation following institutional policy, for this type of procedure. Post-procedural neurological assessment was performed, showing return to baseline, prior to discharge. The patient was provided with post-procedure discharge instructions, including a section on how to identify potential problems. Should any problems arise concerning this procedure, the patient was given instructions to immediately contact us, at any time, without hesitation. In any case, we plan to contact the patient by telephone for a follow-up status report regarding this interventional procedure.  Comments:  No additional relevant information.  Plan of Care (POC)  Orders:  Orders Placed This Encounter  Procedures   DG PAIN CLINIC  C-ARM 1-60 MIN NO REPORT    Intraoperative interpretation by procedural physician at [facility].    Standing Status:   Standing    Number of Occurrences:   1    Order Specific Question:   Reason for exam:    Answer:   Assistance in needle guidance and placement for procedures requiring needle placement in or near specific anatomical locations not easily accessible without such assistance.    Medications ordered for procedure: PMP checked and reviewed. Meds ordered this encounter  Medications   lidocaine (XYLOCAINE) 2 % (with pres) injection 400 mg   lactated ringers infusion   midazolam (VERSED) injection 0.5-2 mg    Make sure Flumazenil is available in the pyxis when using this medication. If oversedation occurs, administer 0.2 mg IV over 15 sec. If after 45 sec no response, administer 0.2 mg again over 1 min; may repeat at 1 min intervals; not to exceed 4 doses (1 mg)   dexamethasone (DECADRON) injection 10 mg   dexamethasone (DECADRON) injection 10 mg   ropivacaine (PF) 2 mg/mL (0.2%) (NAROPIN) injection 9 mL   tiZANidine (ZANAFLEX) 4 MG tablet    Sig: Take 1 tablet (4 mg total) by mouth every 8 (eight) hours as needed for muscle spasms.    Dispense:  90 tablet    Refill:  1   diclofenac (VOLTAREN) 50 MG EC tablet    Sig: Take 1 tablet (50 mg total) by mouth 2 (two) times daily.    Dispense:  60 tablet    Refill:  2   Medications administered: We administered lidocaine, lactated ringers, midazolam, dexamethasone, dexamethasone, and ropivacaine (PF) 2 mg/mL (0.2%).  See the medical record for exact dosing, route, and time of administration.  Follow-up plan:   Return in about 8 weeks (around 04/08/2023) for F2F PPE.       Status post L2, L3, L4, L5 facet medial branch nerve block  on 03/21/2019: Helpful.  Status post right shoulder steroid injection #2 on 03/09/2019 which was not helpful, having worsening shoulder pain.  Right L3, L4, L5 RFA 02/25/21, Left 03/20/21. B/L GN  RFA 02/11/23           Recent Visits Date Type Provider Dept  01/07/23 Office Visit Edward Jolly, MD [facility]  11/26/22 Procedure visit Edward Jolly, MD [facility]  Showing recent visits within past 90 days and meeting all other requirements Today's Visits Date Type Provider Dept  02/11/23 Procedure visit Edward Jolly, MD [facility]  Showing today's visits and meeting all other requirements Future Appointments Date Type Provider Dept  04/08/23 Appointment Edward Jolly, MD [facility]  Showing future appointments within next 90 days and meeting all other requirements  Disposition: Discharge home  Discharge (Date  Time): 02/11/2023; 1500 hrs.   Primary Care Physician: Lorre Munroe, NP Location: [facility] Note by: Edward Jolly, MD (TTS technology used. I apologize for any typographical errors that were not detected and corrected.) Date: 02/11/2023; Time: 3:23 PM  Disclaimer:  Medicine is not an Visual merchandiser. The only guarantee in medicine is that nothing is guaranteed. It is important to note that the decision to proceed with this intervention was based on the information collected from the patient. The Data and conclusions were drawn from the patient's questionnaire, the interview, and the physical examination. Because the information was provided in large part by the patient, it cannot be guaranteed that it has not been purposely or unconsciously manipulated. Every effort has been made to obtain as much relevant data as possible for this evaluation. It is important to note that the conclusions that lead to this procedure are derived in large part from the available data. Always take into account that the treatment will also be dependent on availability of resources and existing treatment guidelines, considered by other Pain Management Practitioners as being common knowledge and practice, at the time of the  intervention. For Medico-Legal purposes, it is also important to point out that variation in procedural techniques and pharmacological choices are the acceptable norm. The indications, contraindications, technique, and results of the above procedure should only be interpreted and judged by a Board-Certified Interventional Pain Specialist with extensive familiarity and expertise in the same exact procedure and technique.

## 2023-02-11 NOTE — Progress Notes (Signed)
Safety precautions to be maintained throughout the outpatient stay will include: orient to surroundings, keep bed in low position, maintain call bell within reach at all times, provide assistance with transfer out of bed and ambulation.  

## 2023-02-11 NOTE — Patient Instructions (Signed)
Post RF Discharge Instructions  You have just completed Radiofrequency Neurotomy.  The following instructions will provide you with information and guidelines for self-care upon discharge.  If at any time you have questions or concerns please call your physician.  General Instructions:  Be alert for signs of possible infection: redness, swelling, heat, red streaks, elevated temperature, fever.  Please notify your doctor immediately if you have unusual bleeding, trouble breathing, or loss of the ability to control your bowel or bladder.  What to expect:  Most procedures involve the use of local anesthetics (numbing medicine), steroids (anti-inflammatory medicines) and possibly sedation (relaxation or nerve medicine).  Sedation may affect your memory, not allowing you to remember the procedure, or the instructions that we give you after it.  Because of this, you doctor may want to avoid providing you important information after the procedure, since you may not remember.  The doctor will be more than happy to go over the information upon your return.  Local anesthetics, on the other hand, may cause temporary numbness and weakness of the legs or arms, depending on the location of the block.  This numbness/weaknes may last 4-6 hours ( the duration of the local anesthetic).  During this period of numbness, you must be more careful than usual to prevent any injuries to th extremity.  Steroids will begin to work immediately after injected, but on the average, it will take 6-10 days for the swelling to come down to the point where you will be able to tell a difference in terms of the pain.  In summary, you should expect for your pain to get better within 15-20 minutes after the procedure.  This relieve or numbness should last 4-6 hours, after which, it will wear off.  Once it wears off, you may experience more pain than usual for 4-6 days, or until the steroids "kick in".  This discomfort is due to the procedure  itself.  To minimize this, we recommend applying ice (fill a plastic sandwich bag with ice and wrap it on a towel to prevent frostbite) to the area, 15 minutes on and 15 minutes off, the day of the procedure.  This will minimize any swelling.  Starting the next day, you should then start with heat (moist or dry, it does not matter).  Heat therapy should continue until the pain improves (4-6 days).  Be careful not to burn yourself.  In the case of Radiofrequency procedures, you should expect more pain than usual for 5 to 6 weeks after the procedure.  This is how long it takes the burned tissue to heal.  We cannot assess any definite results on the success of the procedure until this recovery period has elapsed.  Post-Procedure Care:  You will want to be careful in moving about.  Muscle spasms in the area of the injection may occur.  Use ice or heat to the area is often helpful.   Occasionally, a spinal headache can develop.  This is different from a normal headache in the sense that it will not go away on it's own, despite normal measures.  If you develop a headache that does not seem to respond to conservative therapy, please let your physician know.  This can be treated with an epidural blood patch.   You may experience some numbness or redness,m however it should be short lived.  If persistent numbness occurs, contact your physician. (Persistent numbness would be defined as lasting more than 4-6 hours).  Use care in   moving the effected arm or leg to avoid further injury.  You may be given a sling or crutches to use temporarily.  Some discoloration may be present in your arms or leg for 24-48 hours.  If you experience shortness of breath or if your lips or fingers develop a dusky or "blue" appearance, contact your physician or go to the emergency room.  Diet  No eating limitations, unless stipulated above.  Nevertheless, if you have had sedation, you may experience some nausea.  In this case, it may be  wise to wait at least two hours prior to resuming regular diet.  Comfort  You may experience a temporary increase in pain.  You will also experience tenderness at the site of injection, which may be accompanied with muscle spasms.  Use of cold or heat is usually helpful.  Take your prescription as directed.  Always exercise caution when taking pain pills.  Activity:  For the first 24 hours after the procedure, do not drive a motor vehicle,  Operate heavy machinery or power tools or handle any weapons.  Consider walking with the use of an assistive device or accompanied by an adult for the next 24 hours.  Do not drink alcoholic beverages including beer.  Do not make any important decisions or sign any legal documents. Go home and rest today.  Resume activities tomorrow, as tolerated.  Use caution in moving about as you may experience mild leg weakness.  Use caution in cooking, use of household electrical appliances and climbing steps.  Medications  May resume pre-procedure medications.  Do not take any drugs, other than what has been prescribed to you.   Other  

## 2023-02-11 NOTE — Addendum Note (Signed)
Addended by: Edward Jolly on: 02/11/2023 03:32 PM   Modules accepted: Orders

## 2023-02-12 ENCOUNTER — Telehealth: Payer: Self-pay | Admitting: *Deleted

## 2023-02-12 NOTE — Telephone Encounter (Signed)
Post procedure call:   no  questions or concerns.  

## 2023-02-20 ENCOUNTER — Other Ambulatory Visit: Payer: Self-pay | Admitting: Internal Medicine

## 2023-02-20 NOTE — Telephone Encounter (Signed)
Requested Prescriptions  Pending Prescriptions Disp Refills   gabapentin (NEURONTIN) 300 MG capsule [Pharmacy Med Name: Gabapentin 300 MG Oral Capsule] 180 capsule 0    Sig: Take 1 capsule by mouth twice daily     Neurology: Anticonvulsants - gabapentin Passed - 02/20/2023 11:28 AM      Passed - Cr in normal range and within 360 days    Creat  Date Value Ref Range Status  08/29/2022 0.78 0.50 - 1.05 mg/dL Final   Creatinine, Urine  Date Value Ref Range Status  08/29/2022 35 20 - 275 mg/dL Final         Passed - Completed PHQ-2 or PHQ-9 in the last 360 days      Passed - Valid encounter within last 12 months    Recent Outpatient Visits           5 months ago DM type 2 with diabetic peripheral neuropathy [facility])   [place] [facility] [place], [place], NP   1 year ago DM type 2 with diabetic peripheral neuropathy [facility])   [place] [place], [name], NP   1 year ago DM type 2 with diabetic peripheral neuropathy [facility])   [place] [place], [name], NP   1 year ago Chronic right hip pain   [facility] [place], [place] W, NP   2 years ago DM type 2 with diabetic peripheral neuropathy [facility] [place] [facility])   [place] [facility] [place], [name], [place]

## 2023-03-10 ENCOUNTER — Other Ambulatory Visit: Payer: Self-pay | Admitting: Internal Medicine

## 2023-03-10 DIAGNOSIS — I10 Essential (primary) hypertension: Secondary | ICD-10-CM

## 2023-03-11 NOTE — Telephone Encounter (Signed)
Requested Prescriptions  Pending Prescriptions Disp Refills   carvedilol (COREG) 25 MG tablet [Pharmacy Med Name: Carvedilol 25 MG Oral Tablet] 180 tablet 0    Sig: TAKE 1 TABLET BY MOUTH TWICE DAILY WITH MEALS     Cardiovascular: Beta Blockers 3 Failed - 03/10/2023  4:51 PM      Failed - Valid encounter within last 6 months    Recent Outpatient Visits           6 months ago DM type 2 with diabetic peripheral neuropathy [facility])   [facility] [place], [place], NP   1 year ago DM type 2 with diabetic peripheral neuropathy [facility])   [place] [facility] [place], [name], NP   1 year ago DM type 2 with diabetic peripheral neuropathy [facility])   [place] [facility] [place], [name], NP   1 year ago Chronic right hip pain   [facility] [place], [place] W, NP   2 years ago DM type 2 with diabetic peripheral neuropathy [facility])    [facility] [place], [name], NP              Passed - Cr in normal range and within 360 days    Creat  Date Value Ref Range Status  08/29/2022 0.78 0.50 - 1.05 mg/dL Final   Creatinine, Urine  Date Value Ref Range Status  08/29/2022 35 20 - 275 mg/dL Final         Passed - AST in normal range and within 360 days    AST  Date Value Ref Range Status  08/29/2022 14 10 - 35 U/L Final         Passed - ALT in normal range and within 360 days    ALT  Date Value Ref Range Status  08/29/2022 14 6 - 29 U/L Final         Passed - Last BP in normal range    BP Readings from Last 1 Encounters:  02/11/23 (!) 133/53         Passed - Last Heart Rate in normal range    Pulse Readings from Last 1 Encounters:  02/11/23 68          furosemide (LASIX) 40 MG tablet [Pharmacy Med Name: Furosemide 40 MG Oral Tablet] 90 tablet 0    Sig: Take 1 tablet by mouth once daily     Cardiovascular:  Diuretics - Loop Failed - 03/10/2023  4:51 PM      Failed - K in  normal range and within 180 days    Potassium  Date Value Ref Range Status  08/29/2022 5.0 3.5 - 5.3 mmol/L Final         Failed - Ca in normal range and within 180 days    Calcium  Date Value Ref Range Status  08/29/2022 9.6 8.6 - 10.4 mg/dL Final         Failed - Na in normal range and within 180 days    Sodium  Date Value Ref Range Status  08/29/2022 138 135 - 146 mmol/L Final  11/30/2015 136 134 - 144 mmol/L Final         Failed - Cr in normal range and within 180 days    Creat  Date Value Ref Range Status  08/29/2022 0.78 0.50 - 1.05 mg/dL Final   Creatinine, Urine  Date Value Ref Range Status  08/29/2022  35 20 - 275 mg/dL Final         Failed - Cl in normal range and within 180 days    Chloride  Date Value Ref Range Status  08/29/2022 104 98 - 110 mmol/L Final         Failed - Mg Level in normal range and within 180 days    No results found for: "MG"       Failed - Valid encounter within last 6 months    Recent Outpatient Visits           6 months ago DM type 2 with diabetic peripheral neuropathy [facility])   [facility] [employer], [name], NP   1 year ago DM type 2 with diabetic peripheral neuropathy [facility])   [place] [facility] [place], [name], NP   1 year ago DM type 2 with diabetic peripheral neuropathy [facility])   [place] [facility] [place], [name], NP   1 year ago Chronic right hip pain   [place] [place], [place], NP   2 years ago DM type 2 with diabetic peripheral neuropathy [facility])   [facility] [place], [place] W, NP              Passed - Last BP in normal range    BP Readings from Last 1 Encounters:  02/11/23 (!) 133/53          amLODipine (NORVASC) 10 MG tablet [Pharmacy Med Name: amLODIPine Besylate 10 MG Oral Tablet] 30 tablet 0    Sig: Take 1 tablet by mouth once daily     Cardiovascular: Calcium Channel Blockers 2 Failed  - 03/10/2023  4:51 PM      Failed - Valid encounter within last 6 months    Recent Outpatient Visits           6 months ago DM type 2 with diabetic peripheral neuropathy [facility])   [place] [facility] [place], [place], NP   1 year ago DM type 2 with diabetic peripheral neuropathy [facility])   [facility] [place], [place], NP   1 year ago DM type 2 with diabetic peripheral neuropathy [facility])   [place] [place], [name], NP   1 year ago Chronic right hip pain   [place] [facility] [place], [place], NP   2 years ago DM type 2 with diabetic peripheral neuropathy [facility])   [place] [facility] [place], [place], NP              Passed - Last BP in normal range    BP Readings from Last 1 Encounters:  02/11/23 (!) 133/53         Passed - Last Heart Rate in normal range    Pulse Readings from Last 1 Encounters:  02/11/23 68

## 2023-03-11 NOTE — Telephone Encounter (Signed)
Called patient to schedule OV appointment, no answer, unable to leave VM. Mychart message sent.

## 2023-03-16 ENCOUNTER — Other Ambulatory Visit: Payer: Self-pay | Admitting: Internal Medicine

## 2023-03-17 NOTE — Telephone Encounter (Signed)
Requested medication (s) are due for refill today: For review  Requested medication (s) are on the active medication list: yes    Last refill: 02/03/23  15ml   0 refills  Future visit scheduled no  Notes to clinic:Pt needs appt.  Attempted to reach to schedule, secured VM, unable to leave message to call back.  Requested Prescriptions  Pending Prescriptions Disp Refills   Insulin Glargine (BASAGLAR KWIKPEN) 100 UNIT/ML [Pharmacy Med Name: Basaglar KwikPen 100 UNIT/ML Subcutaneous Solution Pen-injector] 15 mL 0    Sig: INJECT 32 UNITS SUBCUTANEOUSLY ONCE DAILY (APPOINTMENT  WITH  PROVIDER  BEFORE  NEXT  REFILL  IS  DUE)     Endocrinology:  Diabetes - Insulins Failed - 03/16/2023  9:23 AM      Failed - HBA1C is between 0 and 7.9 and within 180 days    Hemoglobin A1C  Date Value Ref Range Status  01/06/2017 8.0  Final   Hgb A1c MFr Bld  Date Value Ref Range Status  08/29/2022 8.5 (H) <5.7 % of total Hgb Final    Comment:    For someone without known diabetes, a hemoglobin A1c value of 6.5% or greater indicates that they may have  diabetes and this should be confirmed with a follow-up  test. . For someone with known diabetes, a value <7% indicates  that their diabetes is well controlled and a value  greater than or equal to 7% indicates suboptimal  control. A1c targets should be individualized based on  duration of diabetes, age, comorbid conditions, and  other considerations. . Currently, no consensus exists regarding use of hemoglobin A1c for diagnosis of diabetes for children. .          Failed - Valid encounter within last 6 months    Recent Outpatient Visits           6 months ago DM type 2 with diabetic [facility])   [facility] [place], [place], NP   1 year ago DM type 2 with diabetic peripheral neuropathy [facility])   [facility] [place], [name], NP   1 year ago DM type 2 with diabetic peripheral  neuropathy [facility])   [place] [place], [name], NP   1 year ago Chronic right hip pain   [place] [facility] [place], [place], NP   2 years ago DM type 2 with diabetic peripheral neuropathy [facility])    [facility], [name], [place]

## 2023-04-08 ENCOUNTER — Encounter: Payer: Self-pay | Admitting: Student in an Organized Health Care Education/Training Program

## 2023-04-08 ENCOUNTER — Ambulatory Visit
Payer: Medicare Other | Attending: Student in an Organized Health Care Education/Training Program | Admitting: Student in an Organized Health Care Education/Training Program

## 2023-04-08 VITALS — BP 140/67 | HR 77 | Temp 98.2°F | Resp 17 | Ht 62.0 in | Wt 250.0 lb

## 2023-04-08 DIAGNOSIS — M47816 Spondylosis without myelopathy or radiculopathy, lumbar region: Secondary | ICD-10-CM

## 2023-04-08 DIAGNOSIS — M5136 Other intervertebral disc degeneration, lumbar region: Secondary | ICD-10-CM | POA: Diagnosis not present

## 2023-04-08 DIAGNOSIS — M170 Bilateral primary osteoarthritis of knee: Secondary | ICD-10-CM

## 2023-04-08 DIAGNOSIS — M51369 Other intervertebral disc degeneration, lumbar region without mention of lumbar back pain or lower extremity pain: Secondary | ICD-10-CM

## 2023-04-08 DIAGNOSIS — G894 Chronic pain syndrome: Secondary | ICD-10-CM

## 2023-04-08 NOTE — Progress Notes (Signed)
Safety precautions to be maintained throughout the outpatient stay will include: orient to surroundings, keep bed in low position, maintain call bell within reach at all times, provide assistance with transfer out of bed and ambulation.  

## 2023-04-08 NOTE — Patient Instructions (Signed)
GENERAL RISKS AND COMPLICATIONS  What are the risk, side effects and possible complications? Generally speaking, most procedures are safe.  However, with any procedure there are risks, side effects, and the possibility of complications.  The risks and complications are dependent upon the sites that are lesioned, or the type of nerve block to be performed.  The closer the procedure is to the spine, the more serious the risks are.  Great care is taken when placing the radio frequency needles, block needles or lesioning probes, but sometimes complications can occur. Infection: Any time there is an injection through the skin, there is a risk of infection.  This is why sterile conditions are used for these blocks.  There are four possible types of infection. Localized skin infection. Central Nervous System Infection-This can be in the form of Meningitis, which can be deadly. Epidural Infections-This can be in the form of an epidural abscess, which can cause pressure inside of the spine, causing compression of the spinal cord with subsequent paralysis. This would require an emergency surgery to decompress, and there are no guarantees that the patient would recover from the paralysis. Discitis-This is an infection of the intervertebral discs.  It occurs in about 1% of discography procedures.  It is difficult to treat and it may lead to surgery.        2. Pain: the needles have to go through skin and soft tissues, will cause soreness.       3. Damage to internal structures:  The nerves to be lesioned may be near blood vessels or    other nerves which can be potentially damaged.       4. Bleeding: Bleeding is more common if the patient is taking blood thinners such as  aspirin, Coumadin, Ticiid, Plavix, etc., or if he/she have some genetic predisposition  such as hemophilia. Bleeding into the spinal canal can cause compression of the spinal  cord with subsequent paralysis.  This would require an emergency  surgery to  decompress and there are no guarantees that the patient would recover from the  paralysis.       5. Pneumothorax:  Puncturing of a lung is a possibility, every time a needle is introduced in  the area of the chest or upper back.  Pneumothorax refers to free air around the  collapsed lung(s), inside of the thoracic cavity (chest cavity).  Another two possible  complications related to a similar event would include: Hemothorax and Chylothorax.   These are variations of the Pneumothorax, where instead of air around the collapsed  lung(s), you may have blood or chyle, respectively.       6. Spinal headaches: They may occur with any procedures in the area of the spine.       7. Persistent CSF (Cerebro-Spinal Fluid) leakage: This is a rare problem, but may occur  with prolonged intrathecal or epidural catheters either due to the formation of a fistulous  track or a dural tear.       8. Nerve damage: By working so close to the spinal cord, there is always a possibility of  nerve damage, which could be as serious as a permanent spinal cord injury with  paralysis.       9. Death:  Although rare, severe deadly allergic reactions known as "Anaphylactic  reaction" can occur to any of the medications used.      10. Worsening of the symptoms:  We can always make thing worse.  What are the chances   of something like this happening? Chances of any of this occuring are extremely low.  By statistics, you have more of a chance of getting killed in a motor vehicle accident: while driving to the hospital than any of the above occurring .  Nevertheless, you should be aware that they are possibilities.  In general, it is similar to taking a shower.  Everybody knows that you can slip, hit your head and get killed.  Does that mean that you should not shower again?  Nevertheless always keep in mind that statistics do not mean anything if you happen to be on the wrong side of them.  Even if a procedure has a 1 (one) in a  1,000,000 (million) chance of going wrong, it you happen to be that one..Also, keep in mind that by statistics, you have more of a chance of having something go wrong when taking medications.  Who should not have this procedure? If you are on a blood thinning medication (e.g. Coumadin, Plavix, see list of "Blood Thinners"), or if you have an active infection going on, you should not have the procedure.  If you are taking any blood thinners, please inform your physician.  How should I prepare for this procedure? Do not eat or drink anything at least six hours prior to the procedure. Bring a driver with you .  It cannot be a taxi. Come accompanied by an adult that can drive you back, and that is strong enough to help you if your legs get weak or numb from the local anesthetic. Take all of your medicines the morning of the procedure with just enough water to swallow them. If you have diabetes, make sure that you are scheduled to have your procedure done first thing in the morning, whenever possible. If you have diabetes, take only half of your insulin dose and notify our nurse that you have done so as soon as you arrive at the clinic. If you are diabetic, but only take blood sugar pills (oral hypoglycemic), then do not take them on the morning of your procedure.  You may take them after you have had the procedure. Do not take aspirin or any aspirin-containing medications, at least eleven (11) days prior to the procedure.  They may prolong bleeding. Wear loose fitting clothing that may be easy to take off and that you would not mind if it got stained with Betadine or blood. Do not wear any jewelry or perfume Remove any nail coloring.  It will interfere with some of our monitoring equipment.  NOTE: Remember that this is not meant to be interpreted as a complete list of all possible complications.  Unforeseen problems may occur.  BLOOD THINNERS The following drugs contain aspirin or other products,  which can cause increased bleeding during surgery and should not be taken for 2 weeks prior to and 1 week after surgery.  If you should need take something for relief of minor pain, you may take acetaminophen which is found in Tylenol,m Datril, Anacin-3 and Panadol. It is not blood thinner. The products listed below are.  Do not take any of the products listed below in addition to any listed on your instruction sheet.  A.P.C or A.P.C with Codeine Codeine Phosphate Capsules #3 Ibuprofen Ridaura  ABC compound Congesprin Imuran rimadil  Advil Cope Indocin Robaxisal  Alka-Seltzer Effervescent Pain Reliever and Antacid Coricidin or Coricidin-D  Indomethacin Rufen  Alka-Seltzer plus Cold Medicine Cosprin Ketoprofen S-A-C Tablets  Anacin Analgesic Tablets or Capsules Coumadin   Korlgesic Salflex  Anacin Extra Strength Analgesic tablets or capsules CP-2 Tablets Lanoril Salicylate  Anaprox Cuprimine Capsules Levenox Salocol  Anexsia-D Dalteparin Magan Salsalate  Anodynos Darvon compound Magnesium Salicylate Sine-off  Ansaid Dasin Capsules Magsal Sodium Salicylate  Anturane Depen Capsules Marnal Soma  APF Arthritis pain formula Dewitt's Pills Measurin Stanback  Argesic Dia-Gesic Meclofenamic Sulfinpyrazone  Arthritis Bayer Timed Release Aspirin Diclofenac Meclomen Sulindac  Arthritis pain formula Anacin Dicumarol Medipren Supac  Analgesic (Safety coated) Arthralgen Diffunasal Mefanamic Suprofen  Arthritis Strength Bufferin Dihydrocodeine Mepro Compound Suprol  Arthropan liquid Dopirydamole Methcarbomol with Aspirin Synalgos  ASA tablets/Enseals Disalcid Micrainin Tagament  Ascriptin Doan's Midol Talwin  Ascriptin A/D Dolene Mobidin Tanderil  Ascriptin Extra Strength Dolobid Moblgesic Ticlid  Ascriptin with Codeine Doloprin or Doloprin with Codeine Momentum Tolectin  Asperbuf Duoprin Mono-gesic Trendar  Aspergum Duradyne Motrin or Motrin IB Triminicin  Aspirin plain, buffered or enteric coated  Durasal Myochrisine Trigesic  Aspirin Suppositories Easprin Nalfon Trillsate  Aspirin with Codeine Ecotrin Regular or Extra Strength Naprosyn Uracel  Atromid-S Efficin Naproxen Ursinus  Auranofin Capsules Elmiron Neocylate Vanquish  Axotal Emagrin Norgesic Verin  Azathioprine Empirin or Empirin with Codeine Normiflo Vitamin E  Azolid Emprazil Nuprin Voltaren  Bayer Aspirin plain, buffered or children's or timed BC Tablets or powders Encaprin Orgaran Warfarin Sodium  Buff-a-Comp Enoxaparin Orudis Zorpin  Buff-a-Comp with Codeine Equegesic Os-Cal-Gesic   Buffaprin Excedrin plain, buffered or Extra Strength Oxalid   Bufferin Arthritis Strength Feldene Oxphenbutazone   Bufferin plain or Extra Strength Feldene Capsules Oxycodone with Aspirin   Bufferin with Codeine Fenoprofen Fenoprofen Pabalate or Pabalate-SF   Buffets II Flogesic Panagesic   Buffinol plain or Extra Strength Florinal or Florinal with Codeine Panwarfarin   Buf-Tabs Flurbiprofen Penicillamine   Butalbital Compound Four-way cold tablets Penicillin   Butazolidin Fragmin Pepto-Bismol   Carbenicillin Geminisyn Percodan   Carna Arthritis Reliever Geopen Persantine   Carprofen Gold's salt Persistin   Chloramphenicol Goody's Phenylbutazone   Chloromycetin Haltrain Piroxlcam   Clmetidine heparin Plaquenil   Cllnoril Hyco-pap Ponstel   Clofibrate Hydroxy chloroquine Propoxyphen         Before stopping any of these medications, be sure to consult the physician who ordered them.  Some, such as Coumadin (Warfarin) are ordered to prevent or treat serious conditions such as "deep thrombosis", "pumonary embolisms", and other heart problems.  The amount of time that you may need off of the medication may also vary with the medication and the reason for which you were taking it.  If you are taking any of these medications, please make sure you notify your pain physician before you undergo any procedures.         Moderate Conscious  Sedation, Adult Sedation is the use of medicines to help you relax and not feel pain. Moderate conscious sedation is a type of sedation that makes you less alert than normal. You are still able to respond to instructions, touch, or both. This type of sedation is used during short medical and dental procedures. It is milder than deep sedation, which is a type of sedation you cannot be easily woken up from. It is also milder than general anesthesia, which is the use of medicines to make you fall asleep. Moderate conscious sedation lets you return to your normal activities sooner. Tell a health care provider about: Any allergies you have. All medicines you are taking, including vitamins, herbs, steroids, eye drops, creams, and over-the-counter medicines. Any problems you or family members have had with anesthesia.   Any bleeding problems you have. Any surgeries you have had. Any medical conditions you have. Whether you are pregnant or may be pregnant. Any recent alcohol, tobacco, or drug use. What are the risks? Your health care provider will talk with you about risks. These may include: Oversedation. This is when you get too much medicine. Nausea or vomiting. Allergic reaction to medicines. Trouble breathing. If this happens, a breathing tube may be used. It will be removed when you can breathe better on your own. Heart trouble. Lung trouble. Emergence delirium. This is when you feel confused while the sedation wears off. This gets better with time. What happens before the procedure? When to stop eating and drinking Follow instructions from your health care provider about what you may eat and drink. These may include: 8 hours before your procedure Stop eating most foods. Do not eat meat, fried foods, or fatty foods. Eat only light foods, such as toast or crackers. All liquids are okay except energy drinks and alcohol. 6 hours before your procedure Stop eating. Drink only clear liquids, such  as water, clear fruit juice, black coffee, plain tea, and sports drinks. Do not drink energy drinks or alcohol. 2 hours before your procedure Stop drinking all liquids. You may be allowed to take medicines with small sips of water. If you do not follow your health care provider's instructions, your procedure may be delayed or canceled. Medicines Ask your health care provider about: Changing or stopping your regular medicines. These include any diabetes medicines or blood thinners you take. Taking medicines such as aspirin and ibuprofen. These medicines can thin your blood. Do not take them unless your health care provider tells you to. Taking over-the-counter medicines, vitamins, herbs, and supplements. Tests and exams You may have an exam or testing. You may have a blood or urine sample taken. General instructions Do not use any products that contain nicotine or tobacco for at least 4 weeks before the procedure. These products include cigarettes, chewing tobacco, and vaping devices, such as e-cigarettes. If you need help quitting, ask your health care provider. If you will be going home right after the procedure, plan to have a responsible adult: Take you home from the hospital or clinic. You will not be allowed to drive. Care for you for the time you are told. What happens during the procedure?  You will be given the sedative. It may be given: As a pill you can take by mouth. It can also be put into the rectum. As a spray through the nose. As an injection into muscle. As an injection into a vein through an IV. You may be given oxygen as needed. Your blood pressure, heart rate, breathing rate, and blood oxygen level will be monitored during the procedure. The medical or dental procedure will be done. The procedure may vary among health care providers and hospitals. What happens after the procedure? Your blood pressure, heart rate, breathing rate, and blood oxygen level will be  monitored until you leave the hospital or clinic. You will get fluids through an IV as needed. Do not drive or operate machinery until your health care provider says that it is safe. This information is not intended to replace advice given to you by your health care provider. Make sure you discuss any questions you have with your health care provider. Document Revised: 03/03/2022 Document Reviewed: 03/03/2022 Elsevier Patient Education  2024 [employer] Radiofrequency Ablation Radiofrequency ablation is a procedure that is performed to relieve pain. The   procedure is often used for back, neck, or arm pain. Radiofrequency ablation involves the use of a machine that creates radio waves to make heat. During the procedure, the heat is applied to the nerve that carries the pain signal. The heat damages the nerve and interferes with the pain signal. Pain relief usually starts about 2 weeks after the procedure and lasts for 6 months to 1 year. Tell a health care provider about: Any allergies you have. All medicines you are taking, including vitamins, herbs, eye drops, creams, and over-the-counter medicines. Any problems you or family members have had with anesthetic medicines. Any bleeding problems you have. Any surgeries you have had. Any medical conditions you have. Whether you are pregnant or may be pregnant. What are the risks? Generally, this is a safe procedure. However, problems may occur, including: Pain or soreness at the injection site. Allergic reaction to medicines given during the procedure. Bleeding. Infection at the injection site. Damage to nerves or blood vessels. What happens before the procedure? When to stop eating and drinking Follow instructions from your health care provider about what you may eat and drink before your procedure. These may include: 8 hours before the procedure Stop eating most foods. Do not eat meat, fried foods, or fatty foods. Eat only light foods,  such as toast or crackers. All liquids are okay except energy drinks and alcohol. 6 hours before the procedure Stop eating. Drink only clear liquids, such as water, clear fruit juice, black coffee, plain tea, and sports drinks. Do not drink energy drinks or alcohol. 2 hours before the procedure Stop drinking all liquids. You may be allowed to take medicine with small sips of water. If you do not follow your health care provider's instructions, your procedure may be delayed or canceled. Medicines Ask your health care provider about: Changing or stopping your regular medicines. This is especially important if you are taking diabetes medicines or blood thinners. Taking medicines such as aspirin and ibuprofen. These medicines can thin your blood. Do not take these medicines unless your health care provider tells you to take them. Taking over-the-counter medicines, vitamins, herbs, and supplements. General instructions Ask your health care provider what steps will be taken to help prevent infection. These steps may include: Removing hair at the procedure site. Washing skin with a germ-killing soap. Taking antibiotic medicine. If you will be going home right after the procedure, plan to have a responsible adult: Take you home from the hospital or clinic. You will not be allowed to drive. Care for you for the time you are told. What happens during the procedure?  You will be awake during the procedure. You will need to be able to talk with the health care provider during the procedure. An IV will be inserted into one of your veins. You will be given one or more of the following: A medicine to help you relax (sedative). A medicine to numb the area (local anesthetic). Your health care provider will insert a radiofrequency needle into the area to be treated. This is done with the help of fluoroscopy. A wire that carries the radio waves (electrode) will be put through the radiofrequency  needle. An electrical pulse will be sent through the electrode to verify the correct nerve that is causing your pain. You will feel a tingling sensation, and you may have muscle twitching. The tissue around the needle tip will be heated by an electric current that comes from the radiofrequency machine. This will numb the nerves.   The needle will be removed. A bandage (dressing) will be put on the insertion area. The procedure may vary among health care providers and hospitals. What happens after the procedure? Your blood pressure, heart rate, breathing rate, and blood oxygen level will be monitored until you leave the hospital or clinic. Return to your normal activities as told by your health care provider. Ask your health care provider what activities are safe for you. If you were given a sedative during the procedure, it can affect you for several hours. Do not drive or operate machinery until your health care provider says that it is safe. Summary Radiofrequency ablation is a procedure that is performed to relieve pain. The procedure is often used for back, neck, or arm pain. Radiofrequency ablation involves the use of a machine that creates radio waves to make heat. Plan to have a responsible adult take you home from the hospital or clinic. Do not drive or operate machinery until your health care provider says that it is safe. Return to your normal activities as told by your health care provider. Ask your health care provider what activities are safe for you. This information is not intended to replace advice given to you by your health care provider. Make sure you discuss any questions you have with your health care provider. Document Revised: 02/05/2021 Document Reviewed: 02/05/2021 Elsevier Patient Education  2024 [employer]  

## 2023-04-08 NOTE — Progress Notes (Signed)
PROVIDER NOTE: Information contained herein reflects review and annotations entered in association with encounter. Interpretation of such information and data should be left to medically-trained personnel. Information provided to patient can be located elsewhere in the medical record under "Patient Instructions". Document created using STT-dictation technology, any transcriptional errors that may result from process are unintentional.    Patient: Stacy Martinez  Service Category: E/M  Provider: Edward Jolly, MD  DOB: [birth year]  DOS: 04/08/2023  Referring Provider: Lorre Munroe, NP  MRN: [id number]  Specialty: Interventional Pain Management  PCP: Lorre Munroe, NP  Type: Established Patient  Setting: Ambulatory outpatient    Location: Office  Delivery: Face-to-face     HPI  [name], a 67 y.o. year old female, is here today because of her Bilateral primary osteoarthritis of knee [M17.0]. [name]'s primary complain today is low back pain and Knee Pain (Bilat, left is worse)  Pertinent problems: [name] has Spondylosis without myelopathy or radiculopathy, lumbar region and Lumbar spondylosis on their pertinent problem list. Pain Assessment: Severity of Chronic pain is reported as a 10-Worst pain ever (left leg)/10. Location: Knee Right, Left/denies. Onset: More than a month ago. Quality: Aching, Dull. Timing: Constant. Modifying factor(s): sitting. Vitals:  height is 5\' 2"  (1.575 m) and weight is 250 lb (113.4 kg). Her temperature is 98.2 F (36.8 C). Her blood pressure is 140/67 (abnormal) and her pulse is 77. Her respiration is 17 and oxygen saturation is 97%.  BMI: Estimated body mass index is 45.73 kg/m as calculated from the following:   Height as of this encounter: 5\' 2"  (1.575 m).   Weight as of this encounter: 250 lb (113.4 kg). Last encounter: 01/07/2023. Last procedure: 02/11/2023.  Reason for encounter: post-procedure evaluation and assessment.    Post-procedure evaluation     Procedure:          Anesthesia, Analgesia, Anxiolysis:  Type: Therapeutic Superolateral, Superomedial, and Inferomedial, Genicular Nerve Radiofrequency Ablation (destruction).   #2  Region: Lateral, Anterior, and Medial aspects of the knee joint, above and below the knee joint proper. Level: Superior and inferior to the knee joint. Laterality: Bilateral  Anesthesia: Local (1-2% Lidocaine)  Anxiolysis: None  Sedation: None  Guidance: Fluoroscopy           Position: Supine   Indications: 1. Bilateral primary osteoarthritis of knee   2. Lumbar spondylosis   3. Chronic pain syndrome   4. Facet syndrome, lumbar    [name] has been dealing with the above chronic pain for longer than three months and has either failed to respond, was unable to tolerate, or simply did not get enough benefit from other more conservative therapies including, but not limited to: 1. Over-the-counter medications 2. Anti-inflammatory medications 3. Muscle relaxants 4. Membrane stabilizers 5. Opioids 6. Physical therapy and/or chiropractic manipulation 7. Modalities (Heat, ice, etc.) 8. Invasive techniques such as nerve blocks. [name] has attained more than 50% relief of the pain from a series of diagnostic injections conducted in separate occasions.  Pain Score: Pre-procedure: 8 /10 Post-procedure: 0-No pain/10     Effectiveness:  Initial hour after procedure: 100 %  Subsequent 4-6 hours post-procedure: 100 %  Analgesia past initial 6 hours: 100 % (X 2 days then RIGHT 50% better and LEFT worse than before procedure)  Ongoing improvement:  Analgesic:  50% for right knee, left knee 0%   ROS  Constitutional: Denies any fever or chills Gastrointestinal: No reported hemesis, hematochezia, vomiting, or acute  GI distress Musculoskeletal:  low back and bilateral knee pain Neurological: No reported episodes of acute onset apraxia, aphasia, dysarthria, agnosia, amnesia, paralysis, loss of coordination, or  loss of consciousness  Medication Review  Basaglar KwikPen, Insulin Pen Needle, ReliOn Ultra Thin Lancets, acetaminophen, amLODipine, carvedilol, diclofenac, empagliflozin, enalapril, furosemide, gabapentin, glucose blood, ondansetron, sitaGLIPtin, and tiZANidine  History Review  Allergy: [name] is allergic to prednisone and metformin and related. Drug: [name]  reports no history of drug use. Alcohol:  reports no history of alcohol use. Tobacco:  reports that she has never smoked. She has never used smokeless tobacco. Social: [name]  reports that she has never smoked. She has never used smokeless tobacco. She reports that she does not drink alcohol and does not use drugs. Medical:  has a past medical history of Allergy, Arthritis, Diabetes mellitus without complication (HCC), Enlarged heart, and Hypertension. Surgical: [name]  has a past surgical history that includes Wrist fracture surgery (Right, 2013); Cesarean section (1999, 2001); Hernia repair (2001, 2006); Fracture surgery; Reverse shoulder arthroplasty (Right, 08/27/2020); and Total hip arthroplasty (Right, 11/29/2021). Family: family history includes Cancer in her father; Diabetes in her mother; Mental illness in her son.  Laboratory Chemistry Profile   Renal Lab Results  Component Value Date   BUN 18 08/29/2022   CREATININE 0.78 08/29/2022   LABCREA 35 08/29/2022   BCR SEE NOTE: 08/29/2022   GFRAA 102 03/22/2019   GFRNONAA >60 11/30/2021    Hepatic Lab Results  Component Value Date   AST 14 08/29/2022   ALT 14 08/29/2022   ALBUMIN 3.8 11/15/2021   ALKPHOS 95 11/15/2021   LIPASE 19 12/18/2013    Electrolytes Lab Results  Component Value Date   NA 138 08/29/2022   K 5.0 08/29/2022   CL 104 08/29/2022   CALCIUM 9.6 08/29/2022    Bone No results found for: "VD25OH", "VD125OH2TOT", "ZO1096EA5", "WU9811BJ4", "25OHVITD1", "25OHVITD2", "25OHVITD3", "TESTOFREE", "TESTOSTERONE"  Inflammation (CRP: Acute Phase) (ESR:  Chronic Phase) Lab Results  Component Value Date   LATICACIDVEN 1.72 01/02/2014         Note: Above Lab results reviewed.   Physical Exam  General appearance: Well nourished, well developed, and well hydrated. In no apparent acute distress Mental status: Alert, oriented x 3 (person, place, & time)       Respiratory: No evidence of acute respiratory distress Eyes: PERLA Vitals: BP (!) 140/67   Pulse 77   Temp 98.2 F (36.8 C)   Resp 17   Ht 5\' 2"  (1.575 m)   Wt 250 lb (113.4 kg)   SpO2 97%   BMI 45.73 kg/m  BMI: Estimated body mass index is 45.73 kg/m as calculated from the following:   Height as of this encounter: 5\' 2"  (1.575 m).   Weight as of this encounter: 250 lb (113.4 kg). Ideal: Ideal body weight: 50.1 kg (110 lb 7.2 oz) Adjusted ideal body weight: 75.4 kg (166 lb 4.3 oz)  Lumbar Spine Area Exam  Skin & Axial Inspection: No masses, redness, or swelling Alignment: Symmetrical Functional ROM: Pain restricted ROM affecting both sides Stability: No instability detected Muscle Tone/Strength: Functionally intact. No obvious neuro-muscular anomalies detected. Sensory (Neurological): Musculoskeletal pain pattern Palpation: No palpable anomalies       Provocative Tests: Hyperextension/rotation test: (+) bilaterally for facet joint pain. Lumbar quadrant test (Kemp's test): (+) bilaterally for facet joint pain.  Gait & Posture Assessment  Ambulation: Unassisted Gait: Antalgic gait (limping) Posture: Difficulty standing up straight, due  to pain   Lower Extremity Exam    Side: Right lower extremity  Side: Left lower extremity  Stability: No instability observed          Stability: No instability observed          Skin & Extremity Inspection: Skin color, temperature, and hair growth are WNL. No peripheral edema or cyanosis. No masses, redness, swelling, asymmetry, or associated skin lesions. No contractures.  Skin & Extremity Inspection: Skin color, temperature, and hair  growth are WNL. No peripheral edema or cyanosis. No masses, redness, swelling, asymmetry, or associated skin lesions. No contractures.  Functional ROM: Pain restricted ROM for hip and knee joints          Functional ROM: Pain restricted ROM for knee joint          Muscle Tone/Strength: Functionally intact. No obvious neuro-muscular anomalies detected.  Muscle Tone/Strength: Functionally intact. No obvious neuro-muscular anomalies detected.  Sensory (Neurological): Arthropathic arthralgia        Sensory (Neurological): Arthropathic arthralgia        DTR: Patellar: deferred today Achilles: deferred today Plantar: deferred today  DTR: Patellar: deferred today Achilles: deferred today Plantar: deferred today  Palpation: No palpable anomalies  Palpation: No palpable anomalies    Assessment   Diagnosis Status  1. Bilateral primary osteoarthritis of knee   2. Lumbar spondylosis   3. Facet syndrome, lumbar   4. DDD (degenerative disc disease), lumbar   5. Chronic pain syndrome    Persistent Having a Flare-up Having a Flare-up    Plan of Care  Follow-up status post bilateral genicular nerve RFA.  Right knee pain 50% better however left knee pain is back to baseline and she states that it may be even worse.  She saw orthopedics today and has been told that she needs to lose weight and then she can be a candidate for knee replacement surgery. She is complaining of increased low back pain related to lumbar facet arthropathy.  She received benefit from lumbar facet radiofrequency ablation.  Left L3, L4, L5 RFA done 10/29/2022, right L3, L4, L5 RFA done 11/26/2022 I provided her with 75% pain relief for approximately 5 months.  Given return of pain, we discussed repeating.  Risk benefits reviewed and patient would like to proceed.  Orders:  Orders Placed This Encounter  Procedures   Radiofrequency,Lumbar    Standing Status:   Future    Standing Expiration Date:   07/09/2023    Scheduling  Instructions:     Side(s): Bilateral     Level(s): L3, L4, L5,  Medial Branch Nerve(s)     Sedation: With Sedation     Scheduling Timeframe: As soon as pre-approved    Order Specific Question:   Where will this procedure be performed?    Answer:   [facility]   Follow-up plan:   Return in about 2 weeks (around 04/22/2023) for B/L L3, 4, 5 RFA , ECT.      Recent Visits Date Type Provider Dept  02/11/23 Procedure visit Edward Jolly, MD [facility]  Showing recent visits within past 90 days and meeting all other requirements Today's Visits Date Type Provider Dept  04/08/23 Office Visit Edward Jolly, MD [facility]  Showing today's visits and meeting all other requirements Future Appointments No visits were found meeting these conditions. Showing future appointments within next 90 days and meeting all other requirements  I discussed the assessment and treatment plan with the patient. The patient  was provided an opportunity to ask questions and all were answered. The patient agreed with the plan and demonstrated an understanding of the instructions.  Patient advised to call back or seek an in-person evaluation if the symptoms or condition worsens.  Duration of encounter: .  Total time on encounter, as per AMA guidelines included both the face-to-face and non-face-to-face time personally spent by the physician and/or other qualified health care professional(s) on the day of the encounter (includes time in activities that require the physician or other qualified health care professional and does not include time in activities normally performed by clinical staff). Physician's time may include the following activities when performed: Preparing to see the patient (e.g., pre-charting review of records, searching for previously ordered imaging, lab work, and nerve conduction tests) Review of prior analgesic pharmacotherapies. Reviewing PMP Interpreting  ordered tests (e.g., lab work, imaging, nerve conduction tests) Performing post-procedure evaluations, including interpretation of diagnostic procedures Obtaining and/or reviewing separately obtained history Performing a medically appropriate examination and/or evaluation Counseling and educating the patient/family/caregiver Ordering medications, tests, or procedures Referring and communicating with other health care professionals (when not separately reported) Documenting clinical information in the electronic or other health record Independently interpreting results (not separately reported) and communicating results to the patient/ family/caregiver Care coordination (not separately reported)  Note by: Edward Jolly, MD Date: 04/08/2023; Time: 3:35 PM

## 2023-04-23 ENCOUNTER — Other Ambulatory Visit: Payer: Self-pay | Admitting: Internal Medicine

## 2023-04-23 DIAGNOSIS — I10 Essential (primary) hypertension: Secondary | ICD-10-CM

## 2023-04-24 NOTE — Telephone Encounter (Signed)
Requested medication (s) are due for refill today: yes  Requested medication (s) are on the active medication list: yes  Last refill:  03/11/23  Future visit scheduled: no  Notes to clinic:  Unable to refill per protocol, courtesy refill already given, routing for provider approval.      Requested Prescriptions  Pending Prescriptions Disp Refills   amLODipine (NORVASC) 10 MG tablet [Pharmacy Med Name: amLODIPine Besylate 10 MG Oral Tablet] 30 tablet 0    Sig: TAKE 1 TABLET BY MOUTH ONCE DAILY (COURTSEY  REFILL,  APPOINTMENT  NEEDED)     Cardiovascular: Calcium Channel Blockers 2 Failed - 04/23/2023 11:19 AM      Failed - Last BP in normal range    BP Readings from Last 1 Encounters:  04/08/23 (!) 140/67         Failed - Valid encounter within last 6 months    Recent Outpatient Visits           7 months ago DM type 2 with diabetic peripheral neuropathy [facility])   [place] [facility] [place], [place], NP   1 year ago DM type 2 with diabetic peripheral neuropathy [facility])   [place] [facility], [name], NP   1 year ago DM type 2 with diabetic peripheral neuropathy [facility])   [place] [facility] [place], [name], NP   1 year ago Chronic right hip pain   [place] [facility] [place], [place] W, NP   2 years ago DM type 2 with diabetic peripheral neuropathy [facility])   [place] [facility] [place], [place], NP              Passed - Last Heart Rate in normal range    Pulse Readings from Last 1 Encounters:  04/08/23 77          carvedilol (COREG) 25 MG tablet [Pharmacy Med Name: Carvedilol 25 MG Oral Tablet] 60 tablet 0    Sig: TAKE 1 TABLET BY MOUTH TWICE DAILY WITH MEALS (COURTSEY  REFILL,  APPOINTMENT  NEEDED)     Cardiovascular: Beta Blockers 3 Failed - 04/23/2023 11:19 AM      Failed - Last BP in normal range    BP Readings from Last 1 Encounters:  04/08/23 (!) 140/67          Failed - Valid encounter within last 6 months    Recent Outpatient Visits           7 months ago DM type 2 with diabetic peripheral neuropathy [facility]   [place] [facility] [place], [place], NP   1 year ago DM type 2 with diabetic peripheral neuropathy [facility])   [place] [facility] [place], [place], NP   1 year ago DM type 2 with diabetic peripheral neuropathy [facility])   [place] [facility] [place], [name], NP   1 year ago Chronic right hip pain   [place] [facility] [place], [place], NP   2 years ago DM type 2 with diabetic peripheral neuropathy [facility])   [place] [facility] [place], [place] W, NP              Passed - Cr in normal range and within 360 days    Creat  Date Value Ref Range Status  08/29/2022 0.78 0.50 - 1.05 mg/dL Final   Creatinine, Urine  Date Value Ref Range Status  08/29/2022 35 20 - 275 mg/dL Final         Passed - AST in normal range and within 360 days    AST  Date Value Ref Range Status  08/29/2022 14 10 - 35 U/L Final         Passed - ALT in normal range and within 360 days    ALT  Date Value Ref Range Status  08/29/2022 14 6 - 29 U/L Final         Passed - Last Heart Rate in normal range    Pulse Readings from Last 1 Encounters:  04/08/23 77          furosemide (LASIX) 40 MG tablet [Pharmacy Med Name: Furosemide 40 MG Oral Tablet] 30 tablet 0    Sig: TAKE 1 TABLET BY MOUTH ONCE DAILY (COURTESY  REFILL,  APPOINTMENT  NEEDED)     Cardiovascular:  Diuretics - Loop Failed - 04/23/2023 11:19 AM      Failed - K in normal range and within 180 days    Potassium  Date Value Ref Range Status  08/29/2022 5.0 3.5 - 5.3 mmol/L Final         Failed - Ca in normal range and within 180 days    Calcium  Date Value Ref Range Status  08/29/2022 9.6 8.6 - 10.4 mg/dL Final         Failed - Na in normal range and within 180 days    Sodium  Date Value Ref Range  Status  08/29/2022 138 135 - 146 mmol/L Final  11/30/2015 136 134 - 144 mmol/L Final         Failed - Cr in normal range and within 180 days    Creat  Date Value Ref Range Status  08/29/2022 0.78 0.50 - 1.05 mg/dL Final   Creatinine, Urine  Date Value Ref Range Status  08/29/2022 35 20 - 275 mg/dL Final         Failed - Cl in normal range and within 180 days    Chloride  Date Value Ref Range Status  08/29/2022 104 98 - 110 mmol/L Final         Failed - Mg Level in normal range and within 180 days    No results found for: "MG"       Failed - Last BP in normal range    BP Readings from Last 1 Encounters:  04/08/23 (!) 140/67         Failed - Valid encounter within last 6 months    Recent Outpatient Visits           7 months ago DM type 2 with diabetic peripheral neuropathy [facility])   [place] [facility] [place], [name], NP   1 year ago DM type 2 with diabetic peripheral neuropathy [facility])   [place] [facility] [place], [name], NP   1 year ago DM type 2 with diabetic peripheral neuropathy [facility])   [place] [facility] [place], [name], NP   1 year ago Chronic right hip pain   [place] [facility] [place], [place], NP   2 years ago DM type 2 with diabetic peripheral neuropathy [facility])   [place] [facility] [place], [name], NP               Insulin Glargine (BASAGLAR KWIKPEN) 100 UNIT/ML [Pharmacy Med  Name: Basaglar KwikPen 100 UNIT/ML Subcutaneous Solution Pen-injector] 15 mL 0    Sig: INJECT 32 UNITS SUBCUTANEOUSLY ONCE DAILY (APPOINTMENT  WITH  PROVIDER  BEFORE  NEXT  REFILL  IS  DUE)     Endocrinology:  Diabetes - Insulins Failed - 04/23/2023 11:19 AM      Failed - HBA1C is between 0 and 7.9 and within 180 days    Hemoglobin A1C  Date Value Ref Range Status  01/06/2017 8.0  Final   Hgb A1c MFr Bld  Date Value Ref Range Status  08/29/2022 8.5 (H) <5.7 % of total Hgb Final     Comment:    For someone without known diabetes, a hemoglobin A1c value of 6.5% or greater indicates that they may have  diabetes and this should be confirmed with a follow-up  test. . For someone with known diabetes, a value <7% indicates  that their diabetes is well controlled and a value  greater than or equal to 7% indicates suboptimal  control. A1c targets should be individualized based on  duration of diabetes, age, comorbid conditions, and  other considerations. . Currently, no consensus exists regarding use of hemoglobin A1c for diagnosis of diabetes for children. .          Failed - Valid encounter within last 6 months    Recent Outpatient Visits           7 months ago DM type 2 with diabetic peripheral neuropathy [facility])   [place] [facility] [place], [place], NP   1 year ago DM type 2 with diabetic peripheral neuropathy [facility])   [place] [facility] [place], [name], NP   1 year ago DM type 2 with diabetic peripheral neuropathy [facility])   [place] [facility] [place], [name], NP   1 year ago Chronic right hip pain   [place] [facility] [place], [place], NP   2 years ago DM type 2 with diabetic peripheral neuropathy [facility])    [facility] [place], [name], [place]

## 2023-05-13 ENCOUNTER — Other Ambulatory Visit: Payer: Self-pay | Admitting: Student in an Organized Health Care Education/Training Program

## 2023-05-13 DIAGNOSIS — G894 Chronic pain syndrome: Secondary | ICD-10-CM

## 2023-05-13 DIAGNOSIS — M47816 Spondylosis without myelopathy or radiculopathy, lumbar region: Secondary | ICD-10-CM

## 2023-05-13 DIAGNOSIS — M170 Bilateral primary osteoarthritis of knee: Secondary | ICD-10-CM

## 2023-05-21 ENCOUNTER — Other Ambulatory Visit: Payer: Self-pay | Admitting: Internal Medicine

## 2023-05-21 DIAGNOSIS — E1142 Type 2 diabetes mellitus with diabetic polyneuropathy: Secondary | ICD-10-CM

## 2023-05-21 DIAGNOSIS — I10 Essential (primary) hypertension: Secondary | ICD-10-CM

## 2023-05-22 NOTE — Telephone Encounter (Signed)
Requested medication (s) are due for refill today: yes  Requested medication (s) are on the active medication list: yes  Last refill:  03/11/23  Future visit scheduled: yes  Notes to clinic:  Unable to refill per protocol, courtesy refill already given, routing for provider approval.      Requested Prescriptions  Pending Prescriptions Disp Refills   amLODipine (NORVASC) 10 MG tablet [Pharmacy Med Name: amLODIPine Besylate 10 MG Oral Tablet] 30 tablet 0    Sig: TAKE 1 TABLET BY MOUTH ONCE DAILY (COURTSEY  REFILL,  APPOINTMENT  NEEDED)     Cardiovascular: Calcium Channel Blockers 2 Failed - 05/21/2023 10:21 AM      Failed - Last BP in normal range    BP Readings from Last 1 Encounters:  04/08/23 (!) 140/67         Failed - Valid encounter within last 6 months    Recent Outpatient Visits           8 months ago DM type 2 with diabetic peripheral neuropathy [facility])   [place] [facility] [place], [place], NP   1 year ago DM type 2 with diabetic peripheral neuropathy [facility])   [place] [facility] [place], [place], NP   1 year ago DM type 2 with diabetic peripheral neuropathy [facility])   [place] [facility] [place], [name], NP   1 year ago Chronic right hip pain   [place] [facility] [place], [place] W, NP   2 years ago DM type 2 with diabetic peripheral neuropathy [facility])   [place] [facility] [place], [name], NP       Future Appointments             In 1 week Sampson Si, [name], NP [place] [facility]            Passed - Last Heart Rate in normal range    Pulse Readings from Last 1 Encounters:  04/08/23 77          Insulin Glargine (BASAGLAR KWIKPEN) 100 UNIT/ML [Pharmacy Med Name: Basaglar KwikPen 100 UNIT/ML Subcutaneous Solution Pen-injector] 15 mL 0    Sig: INJECT 32 UNITS SUBCUTANEOUSLY ONCE DAILY (APPOINTMENT  WITH  PROVIDER  BEFORE  NEXT  REFILL  IS  DUE)      Endocrinology:  Diabetes - Insulins Failed - 05/21/2023 10:21 AM      Failed - HBA1C is between 0 and 7.9 and within 180 days    Hemoglobin A1C  Date Value Ref Range Status  01/06/2017 8.0  Final   Hgb A1c MFr Bld  Date Value Ref Range Status  08/29/2022 8.5 (H) <5.7 % of total Hgb Final    Comment:    For someone without known diabetes, a hemoglobin A1c value of 6.5% or greater indicates that they may have  diabetes and this should be confirmed with a follow-up  test. . For someone with known diabetes, a value <7% indicates  that their diabetes is well controlled and a value  greater than or equal to 7% indicates suboptimal  control. A1c targets should be individualized based on  duration of diabetes, age, comorbid conditions, and  other considerations. . Currently, no consensus exists regarding use of hemoglobin A1c for diagnosis of diabetes for children. .          Failed - Valid encounter within last 6 months    Recent Outpatient  Visits           8 months ago DM type 2 with diabetic peripheral neuropathy [facility])   [place] [facility] [place], [name], NP   1 year ago DM type 2 with diabetic peripheral neuropathy [facility])   [place] [facility] [place], [name], NP   1 year ago DM type 2 with diabetic peripheral neuropathy [facility])   [place] [facility] [place], [name], NP   1 year ago Chronic right hip pain   [place] [facility] [place], [place], NP   2 years ago DM type 2 with diabetic peripheral neuropathy [facility]   [place] [facility] [place], [name], NP       Future Appointments             In 1 week Sampson Si, [name], NP [place] [facility]             carvedilol (COREG) 25 MG tablet [Pharmacy Med Name: Carvedilol 25 MG Oral Tablet] 60 tablet 0    Sig: TAKE 1 TABLET BY MOUTH TWICE DAILY WITH MEALS (COURTSEY  REFILL,  APPOINTMENT  NEEDED)      Cardiovascular: Beta Blockers 3 Failed - 05/21/2023 10:21 AM      Failed - Last BP in normal range    BP Readings from Last 1 Encounters:  04/08/23 (!) 140/67         Failed - Valid encounter within last 6 months    Recent Outpatient Visits           8 months ago DM type 2 with diabetic peripheral neuropathy [facility])   [place] [facility] [place], [place], NP   1 year ago DM type 2 with diabetic peripheral neuropathy [facility])   [place] [facility] [place], [place], NP   1 year ago DM type 2 with diabetic peripheral neuropathy [facility])   [place] [facility] [place], [name], NP   1 year ago Chronic right hip pain   [place] [facility] [place], [place], NP   2 years ago DM type 2 with diabetic peripheral neuropathy [facility])   [place] [facility] [place], [name], NP       Future Appointments             In 1 week Sampson Si, [name], NP [place] [facility]            Passed - Cr in normal range and within 360 days    Creat  Date Value Ref Range Status  08/29/2022 0.78 0.50 - 1.05 mg/dL Final   Creatinine, Urine  Date Value Ref Range Status  08/29/2022 35 20 - 275 mg/dL Final         Passed - AST in normal range and within 360 days    AST  Date Value Ref Range Status  08/29/2022 14 10 - 35 U/L Final         Passed - ALT in normal range and within 360 days    ALT  Date Value Ref Range Status  08/29/2022 14 6 - 29 U/L Final         Passed - Last Heart Rate in normal range    Pulse Readings from Last 1 Encounters:  04/08/23 77  furosemide (LASIX) 40 MG tablet [Pharmacy Med Name: Furosemide 40 MG Oral Tablet] 30 tablet 0    Sig: TAKE 1 TABLET BY MOUTH ONCE DAILY (COURTESY  REFILL,  APPOINTMENT  NEEDED)     Cardiovascular:  Diuretics - Loop Failed - 05/21/2023 10:21 AM      Failed - K in normal range and within 180 days    Potassium  Date  Value Ref Range Status  08/29/2022 5.0 3.5 - 5.3 mmol/L Final         Failed - Ca in normal range and within 180 days    Calcium  Date Value Ref Range Status  08/29/2022 9.6 8.6 - 10.4 mg/dL Final         Failed - Na in normal range and within 180 days    Sodium  Date Value Ref Range Status  08/29/2022 138 135 - 146 mmol/L Final  11/30/2015 136 134 - 144 mmol/L Final         Failed - Cr in normal range and within 180 days    Creat  Date Value Ref Range Status  08/29/2022 0.78 0.50 - 1.05 mg/dL Final   Creatinine, Urine  Date Value Ref Range Status  08/29/2022 35 20 - 275 mg/dL Final         Failed - Cl in normal range and within 180 days    Chloride  Date Value Ref Range Status  08/29/2022 104 98 - 110 mmol/L Final         Failed - Mg Level in normal range and within 180 days    No results found for: "MG"       Failed - Last BP in normal range    BP Readings from Last 1 Encounters:  04/08/23 (!) 140/67         Failed - Valid encounter within last 6 months    Recent Outpatient Visits           8 months ago DM type 2 with diabetic peripheral neuropathy [facility])   [place] [facility] [place], [name], NP   1 year ago DM type 2 with diabetic peripheral neuropathy [facility])   [place] [facility] [place], [name], NP   1 year ago DM type 2 with diabetic peripheral neuropathy [facility])   [place] [facility] [place], [name], NP   1 year ago Chronic right hip pain   [place] [facility] [place], [place], NP   2 years ago DM type 2 with diabetic peripheral neuropathy [facility])    [facility] [place], [name], NP       Future Appointments             In 1 week Sampson Si, [name], NP  [facility]             JANUVIA 100 MG tablet [Pharmacy Med Name: Januvia 100 MG Oral Tablet] 90 tablet 0    Sig: Take 1 tablet by mouth once daily      Endocrinology:  Diabetes - DPP-4 Inhibitors Failed - 05/21/2023 10:21 AM      Failed - HBA1C is between 0 and 7.9 and within 180 days    Hemoglobin A1C  Date Value Ref Range Status  01/06/2017 8.0  Final   Hgb A1c MFr Bld  Date Value Ref Range Status  08/29/2022 8.5 (H) <5.7 % of total Hgb Final  Comment:    For someone without known diabetes, a hemoglobin A1c value of 6.5% or greater indicates that they may have  diabetes and this should be confirmed with a follow-up  test. . For someone with known diabetes, a value <7% indicates  that their diabetes is well controlled and a value  greater than or equal to 7% indicates suboptimal  control. A1c targets should be individualized based on  duration of diabetes, age, comorbid conditions, and  other considerations. . Currently, no consensus exists regarding use of hemoglobin A1c for diagnosis of diabetes for children. .          Failed - Valid encounter within last 6 months    Recent Outpatient Visits           8 months ago DM type 2 with diabetic peripheral neuropathy [facility])   [place] [facility] [place], [place], NP   1 year ago DM type 2 with diabetic peripheral neuropathy [facility])   [place] [facility] [place], [name], NP   1 year ago DM type 2 with diabetic peripheral neuropathy [facility])   [place] [facility] [place], [name], NP   1 year ago Chronic right hip pain   [place] [facility] [place], [place], NP   2 years ago DM type 2 with diabetic peripheral neuropathy [facility])   [place] [facility] [place], [name], NP       Future Appointments             In 1 week Sampson Si, [name], NP [facility]            Passed - Cr in normal range and within 360 days    Creat  Date Value Ref Range Status  08/29/2022 0.78 0.50 - 1.05 mg/dL Final   Creatinine, Urine  Date Value Ref Range Status   08/29/2022 35 20 - 275 mg/dL Final          JARDIANCE 25 MG TABS tablet [Pharmacy Med Name: Jardiance 25 MG Oral Tablet] 90 tablet 0    Sig: Take 1 tablet by mouth once daily     Endocrinology:  Diabetes - SGLT2 Inhibitors Failed - 05/21/2023 10:21 AM      Failed - HBA1C is between 0 and 7.9 and within 180 days    Hemoglobin A1C  Date Value Ref Range Status  01/06/2017 8.0  Final   Hgb A1c MFr Bld  Date Value Ref Range Status  08/29/2022 8.5 (H) <5.7 % of total Hgb Final    Comment:    For someone without known diabetes, a hemoglobin A1c value of 6.5% or greater indicates that they may have  diabetes and this should be confirmed with a follow-up  test. . For someone with known diabetes, a value <7% indicates  that their diabetes is well controlled and a value  greater than or equal to 7% indicates suboptimal  control. A1c targets should be individualized based on  duration of diabetes, age, comorbid conditions, and  other considerations. . Currently, no consensus exists regarding use of hemoglobin A1c for diagnosis of diabetes for children. .          Failed - Valid encounter within last 6 months    Recent Outpatient Visits           8 months ago DM type 2 with diabetic peripheral neuropathy (HCC)   [facility] [place], [place], NP   1 year ago DM type 2 with diabetic peripheral neuropathy [facility])   [place] [facility] [place], [name], NP   1 year ago DM type 2 with diabetic peripheral neuropathy [facility])   [place] [facility] [place], [name], NP   1 year ago Chronic right hip pain   [place] [facility] [place], [place], NP   2 years ago DM type 2 with diabetic peripheral neuropathy [facility])   [place] [facility] [place], [name], NP       Future Appointments             In 1 week Lorre Munroe, NP [facility]             Passed - Cr in normal range and within 360 days    Creat  Date Value Ref Range Status  08/29/2022 0.78 0.50 - 1.05 mg/dL Final   Creatinine, Urine  Date Value Ref Range Status  08/29/2022 35 20 - 275 mg/dL Final         Passed - eGFR in normal range and within 360 days    GFR, Est African American  Date Value Ref Range Status  03/22/2019 102 > OR = 60 mL/min/1.49m2 Final   GFR, Est Non African American  Date Value Ref Range Status  03/22/2019 88 > OR = 60 mL/min/1.49m2 Final   GFR, Estimated  Date Value Ref Range Status  11/30/2021 >60 >60 mL/min Final    Comment:    (NOTE) Calculated using the CKD-EPI Creatinine Equation (2021)    eGFR  Date Value Ref Range Status  08/29/2022 84 > OR = 60 mL/min/1.62m2 Final

## 2023-06-01 ENCOUNTER — Encounter: Payer: Self-pay | Admitting: Internal Medicine

## 2023-06-01 ENCOUNTER — Ambulatory Visit (INDEPENDENT_AMBULATORY_CARE_PROVIDER_SITE_OTHER): Payer: Medicare Other | Admitting: Internal Medicine

## 2023-06-01 VITALS — BP 136/80 | HR 72 | Temp 96.6°F | Wt 244.0 lb

## 2023-06-01 DIAGNOSIS — I10 Essential (primary) hypertension: Secondary | ICD-10-CM

## 2023-06-01 DIAGNOSIS — M47816 Spondylosis without myelopathy or radiculopathy, lumbar region: Secondary | ICD-10-CM

## 2023-06-01 DIAGNOSIS — Z23 Encounter for immunization: Secondary | ICD-10-CM | POA: Diagnosis not present

## 2023-06-01 DIAGNOSIS — G894 Chronic pain syndrome: Secondary | ICD-10-CM | POA: Diagnosis not present

## 2023-06-01 DIAGNOSIS — I420 Dilated cardiomyopathy: Secondary | ICD-10-CM

## 2023-06-01 DIAGNOSIS — M170 Bilateral primary osteoarthritis of knee: Secondary | ICD-10-CM

## 2023-06-01 DIAGNOSIS — Z6841 Body Mass Index (BMI) 40.0 and over, adult: Secondary | ICD-10-CM

## 2023-06-01 DIAGNOSIS — D75839 Thrombocytosis, unspecified: Secondary | ICD-10-CM | POA: Diagnosis not present

## 2023-06-01 DIAGNOSIS — E1142 Type 2 diabetes mellitus with diabetic polyneuropathy: Secondary | ICD-10-CM | POA: Diagnosis not present

## 2023-06-01 DIAGNOSIS — E785 Hyperlipidemia, unspecified: Secondary | ICD-10-CM

## 2023-06-01 DIAGNOSIS — E66813 Obesity, class 3: Secondary | ICD-10-CM

## 2023-06-01 DIAGNOSIS — E1169 Type 2 diabetes mellitus with other specified complication: Secondary | ICD-10-CM

## 2023-06-01 LAB — POCT GLYCOSYLATED HEMOGLOBIN (HGB A1C): Hemoglobin A1C: 7.6 % — AB (ref 4.0–5.6)

## 2023-06-01 MED ORDER — TIRZEPATIDE 2.5 MG/0.5ML ~~LOC~~ SOAJ: 2.5000 mg | SUBCUTANEOUS | 0 refills | Status: DC

## 2023-06-01 NOTE — Assessment & Plan Note (Addendum)
POCT A1c 7.6 Urine microalbumin has been checked within the last year Continue Januvia, Jardiance and Basaglar We will add Mounjaro 2.5 mg weekly x 4 weeks then plan to increase to 5 mg weekly Encourage low-carb diet and exercise for weight loss Encouraged eye exam Encouraged routine foot exam Flu shot today Pneumonia vaccinations UTD

## 2023-06-01 NOTE — Addendum Note (Signed)
Addended by: Kavin Leech E on: 06/01/2023 04:26 PM   Modules accepted: Orders

## 2023-06-01 NOTE — Assessment & Plan Note (Signed)
CBC today.  

## 2023-06-01 NOTE — Assessment & Plan Note (Signed)
Encouraged diet and exercise for weight loss ?

## 2023-06-01 NOTE — Assessment & Plan Note (Signed)
C-Met and lipid profile today Encouraged her to consume a low fat diet Continue rosuvastatin and fenofibrate

## 2023-06-01 NOTE — Patient Instructions (Signed)

## 2023-06-01 NOTE — Assessment & Plan Note (Signed)
Encourage weight loss as this can produce joint pain Continue gabapentin and tizanidine for pain management

## 2023-06-01 NOTE — Assessment & Plan Note (Signed)
Continue amlodipine, enalapril, carvedilol and furosemide as prescribed Reinforced DASH diet and exercise for weight loss C-Met today

## 2023-06-01 NOTE — Assessment & Plan Note (Signed)
Continue gabapentin and tizanidine per pain management

## 2023-06-01 NOTE — Progress Notes (Signed)
Subjective:    Patient ID: Stacy Martinez, female    DOB: 21-Jun-1956, 67 y.o.   MRN: [id number]  HPI  Patient presents to clinic today for follow-up of chronic conditions.  HTN with cardiomyopathy: BP today is.  She is taking amlodipine, enalapril, carvedilol and furosemide as prescribed.  ECG from 11/2021 reviewed.  HLD: Her last LDL was 117, triglycerides 225, 08/2022.  She denies myalgias on rosuvastatin and fenofibrate.  She tries to consume a low-fat diet.  DM2 with peripheral neuropathy: Her last A1c was 8.5%, 08/2022.  She is taking jardiance, [place] and basaglar as prescribed.  Her sugars range 120-170.  She takes gabapentin for neuropathic pain.  Her last eye exam was > 1 year ago.  Flu 06/2022.  Prevnar 06/2021.  Pneumovax 06/2019.  COVID never.  OA/chronic pain: Mainly in her right shoulder, back, hips and knees. She is planning on having bilateral knee placements. She is taking gabapentin and tizanidine as prescribed with some relief of symptoms.  She follows with pain management.  Thrombocytosis: Her last platelet count was 454, 08/2019.Marland Kitchen  She does not follow with hematology.  Review of Systems  Past Medical History:  Diagnosis Date   Allergy    Arthritis    Diabetes mellitus without complication (HCC)    Enlarged heart    Hypertension     Current Outpatient Medications  Medication Sig Dispense Refill   acetaminophen (TYLENOL) 500 MG tablet Take 1-2 tablets (500-1,000 mg total) by mouth every 6 (six) hours as needed. 60 tablet 0   amLODipine (NORVASC) 10 MG tablet TAKE 1 TABLET BY MOUTH ONCE DAILY (COURTSEY  REFILL,  APPOINTMENT  NEEDED) 30 tablet 0   carvedilol (COREG) 25 MG tablet TAKE 1 TABLET BY MOUTH TWICE DAILY WITH MEALS (COURTSEY  REFILL,  APPOINTMENT  NEEDED) 60 tablet 0   diclofenac (VOLTAREN) 50 MG EC tablet Take 1 tablet (50 mg total) by mouth 2 (two) times daily. 60 tablet 2   empagliflozin (JARDIANCE) 25 MG TABS tablet Take 1 tablet by mouth once daily 30  tablet 0   enalapril (VASOTEC) 20 MG tablet Take 1 tablet by mouth twice daily 180 tablet 0   furosemide (LASIX) 40 MG tablet TAKE 1 TABLET BY MOUTH ONCE DAILY (COURTESY  REFILL,  APPOINTMENT  NEEDED) 30 tablet 0   gabapentin (NEURONTIN) 300 MG capsule Take 1 capsule by mouth twice daily 180 capsule 1   glucose blood (RELION GLUCOSE TEST STRIPS) test strip Use as instructed 100 each 12   Insulin Glargine (BASAGLAR KWIKPEN) 100 UNIT/ML INJECT 32 UNITS SUBCUTANEOUSLY ONCE DAILY (APPOINTMENT  WITH  PROVIDER  BEFORE  NEXT  REFILL  IS  DUE) 15 mL 0   ondansetron (ZOFRAN) 4 MG tablet Take 1 tablet (4 mg total) by mouth every 6 (six) hours as needed for nausea. 30 tablet 0   ReliOn Ultra Thin Lancets MISC 1 each by Does not apply route daily. 100 each 11   sitaGLIPtin (JANUVIA) 100 MG tablet Take 1 tablet by mouth once daily 30 tablet 0   tiZANidine (ZANAFLEX) 4 MG tablet Take 1 tablet (4 mg total) by mouth every 8 (eight) hours as needed for muscle spasms. 90 tablet 1   ULTICARE MINI PEN NEEDLES 32G X 6 MM MISC 1 Bottle by Does not apply route as directed. 100 each 1   No current facility-administered medications for this visit.    Allergies  Allergen Reactions   Prednisone Swelling   Metformin And Related  Severe nausea and diarrhea even with extended release versions    Family History  Problem Relation Age of Onset   Diabetes Mother    Cancer Father    Mental illness Son     Social History   Socioeconomic History   Marital status: Divorced    Spouse name: Not on file   Number of children: 4   Years of education: Not on file   Highest education level: Not on file  Occupational History   Not on file  Tobacco Use   Smoking status: Never   Smokeless tobacco: Never  Vaping Use   Vaping status: Never Used  Substance and Sexual Activity   Alcohol use: No    Alcohol/week: 0.0 standard drinks of alcohol   Drug use: No   Sexual activity: Not on file  Other Topics Concern   Not  on file  Social History Narrative   Not on file   Social Determinants of Health   Financial Resource Strain: Not on file  Food Insecurity: Not on file  Transportation Needs: Not on file  Physical Activity: Not on file  Stress: Not on file  Social Connections: Not on file  Intimate Partner Violence: Not on file     Constitutional: Denies fever, malaise, fatigue, headache or abrupt weight changes.  HEENT: Denies eye pain, eye redness, ear pain, ringing in the ears, wax buildup, runny nose, nasal congestion, bloody nose, or sore throat. Respiratory: Denies difficulty breathing, shortness of breath, cough or sputum production.   Cardiovascular: Patient reports swelling in legs.  Denies chest pain, chest tightness, palpitations or swelling in the hands.  Gastrointestinal: Denies abdominal pain, bloating, constipation, diarrhea or blood in the stool.  GU: Denies urgency, frequency, pain with urination, burning sensation, blood in urine, odor or discharge. Musculoskeletal: Denies decrease in range of motion, difficulty with gait, muscle pain or joint pain and swelling.  Skin: Denies redness, rashes, lesions or ulcercations.  Neurological: Patient reports neuropathic pain.  Denies dizziness, difficulty with memory, difficulty with speech or problems with balance and coordination.  Psych: Denies anxiety, depression, SI/HI.  No other specific complaints in a complete review of systems (except as listed in HPI above).     Objective:   Physical Exam  BP 136/80 (BP Location: Left Arm, Patient Position: Sitting, Cuff Size: Large)   Pulse 72   Temp (!) 96.6 F (35.9 C) (Temporal)   Wt 244 lb (110.7 kg)   SpO2 96%   BMI 44.63 kg/m   Wt Readings from Last 3 Encounters:  04/08/23 250 lb (113.4 kg)  02/11/23 240 lb (108.9 kg)  11/26/22 240 lb (108.9 kg)    General: Appears her stated age, obese, in NAD. Skin: Warm, dry and intact. No ulcerations noted. HEENT: Head: normal shape and  size; Eyes: sclera white, no icterus, conjunctiva pink, PERRLA and EOMs intact;  Cardiovascular: Normal rate and rhythm. S1,S2 noted.  No murmur, rubs or gallops noted. No JVD or BLE edema. No carotid bruits noted. Pulmonary/Chest: Normal effort and positive vesicular breath sounds. No respiratory distress. No wheezes, rales or ronchi noted.  Musculoskeletal: Gait slow and steady without device. Neurological: Alert and oriented. Coordination normal.  Psychiatric: Mood and affect normal. Behavior is normal. Judgment and thought content normal.    BMET    Component Value Date/Time   NA 138 08/29/2022 1012   NA 136 11/30/2015 1037   K 5.0 08/29/2022 1012   CL 104 08/29/2022 1012   CO2 24 08/29/2022 1012  GLUCOSE 235 (H) 08/29/2022 1012   BUN 18 08/29/2022 1012   BUN 19 11/30/2015 1037   CREATININE 0.78 08/29/2022 1012   CALCIUM 9.6 08/29/2022 1012   GFRNONAA >60 11/30/2021 0615   GFRNONAA 88 03/22/2019 0909   GFRAA 102 03/22/2019 0909    Lipid Panel     Component Value Date/Time   CHOL 204 (H) 08/29/2022 1012   CHOL 182 11/30/2015 1037   TRIG 225 (H) 08/29/2022 1012   HDL 53 08/29/2022 1012   HDL 47 11/30/2015 1037   CHOLHDL 3.8 08/29/2022 1012   VLDL 33 (H) 04/08/2017 0857   LDLCALC 117 (H) 08/29/2022 1012    CBC    Component Value Date/Time   WBC 8.5 08/29/2022 1012   RBC 4.92 08/29/2022 1012   HGB 12.5 08/29/2022 1012   HGB 12.2 11/30/2015 1037   HCT 38.3 08/29/2022 1012   HCT 38.0 11/30/2015 1037   PLT 454 (H) 08/29/2022 1012   PLT 428 (H) 11/30/2015 1037   MCV 77.8 (L) 08/29/2022 1012   MCV 83 11/30/2015 1037   MCH 25.4 (L) 08/29/2022 1012   MCHC 32.6 08/29/2022 1012   RDW 15.1 (H) 08/29/2022 1012   RDW 15.1 11/30/2015 1037   LYMPHSABS 2,278 08/29/2022 1012   LYMPHSABS 3.1 11/30/2015 1037   MONOABS 0.7 11/15/2021 1110   EOSABS 179 08/29/2022 1012   EOSABS 0.4 11/30/2015 1037   BASOSABS 77 08/29/2022 1012   BASOSABS 0.1 11/30/2015 1037    Hgb  A1C Lab Results  Component Value Date   HGBA1C 8.5 (H) 08/29/2022            Assessment & Plan:     RTC in 6 months, follow-up chronic conditions Nicki Reaper, NP

## 2023-06-02 LAB — COMPLETE METABOLIC PANEL WITH GFR
AG Ratio: 1.3 (calc) (ref 1.0–2.5)
ALT: 15 U/L (ref 6–29)
AST: 16 U/L (ref 10–35)
Albumin: 4.4 g/dL (ref 3.6–5.1)
Alkaline phosphatase (APISO): 108 U/L (ref 37–153)
BUN: 16 mg/dL (ref 7–25)
CO2: 24 mmol/L (ref 20–32)
Calcium: 9.6 mg/dL (ref 8.6–10.4)
Chloride: 105 mmol/L (ref 98–110)
Creat: 0.84 mg/dL (ref 0.50–1.05)
Globulin: 3.5 g/dL (ref 1.9–3.7)
Glucose, Bld: 113 mg/dL (ref 65–139)
Potassium: 5.1 mmol/L (ref 3.5–5.3)
Sodium: 141 mmol/L (ref 135–146)
Total Bilirubin: 0.4 mg/dL (ref 0.2–1.2)
Total Protein: 7.9 g/dL (ref 6.1–8.1)
eGFR: 76 mL/min/{1.73_m2} (ref >=60)

## 2023-06-02 LAB — CBC
HCT: 40 % (ref 35.0–45.0)
Hemoglobin: 12.9 g/dL (ref 11.7–15.5)
MCH: 25.4 pg — ABNORMAL LOW (ref 27.0–33.0)
MCHC: 32.3 g/dL (ref 32.0–36.0)
MCV: 78.9 fL — ABNORMAL LOW (ref 80.0–100.0)
MPV: 10.5 fL (ref 7.5–12.5)
Platelets: 478 10*3/uL — ABNORMAL HIGH (ref 140–400)
RBC: 5.07 10*6/uL (ref 3.80–5.10)
RDW: 14.5 % (ref 11.0–15.0)
WBC: 11 10*3/uL — ABNORMAL HIGH (ref 3.8–10.8)

## 2023-06-02 LAB — LIPID PANEL
Cholesterol: 196 mg/dL (ref <200)
HDL: 54 mg/dL (ref >=50)
LDL Cholesterol (Calc): 109 mg/dL — ABNORMAL HIGH
Non-HDL Cholesterol (Calc): 142 mg/dL — ABNORMAL HIGH (ref <130)
Total CHOL/HDL Ratio: 3.6 (calc) (ref <5.0)
Triglycerides: 212 mg/dL — ABNORMAL HIGH (ref <150)

## 2023-06-08 ENCOUNTER — Telehealth: Payer: Self-pay

## 2023-06-08 NOTE — Telephone Encounter (Signed)
Medicare will not approve her RFA. She can only have 2 in the same region in a 12 month period and she had left side done 10/29/22 and right side done 11/26/22. That was her 2. It doesn't matter if it was on the opposite side, its still in the same region and they only allow the 2.

## 2023-06-24 ENCOUNTER — Other Ambulatory Visit: Payer: Self-pay | Admitting: Internal Medicine

## 2023-06-24 ENCOUNTER — Ambulatory Visit: Payer: Medicare Other | Admitting: Dietician

## 2023-06-25 NOTE — Telephone Encounter (Signed)
Requested medication (s) are due for refill today: yes  Requested medication (s) are on the active medication list: yes  Last refill:  06/01/23 2 ml   Future visit scheduled: yes  Notes to clinic:  med not assigned to a protocol   Requested Prescriptions  Pending Prescriptions Disp Refills   MOUNJARO 2.5 MG/0.5ML Pen [Pharmacy Med Name: Mounjaro 2.5 MG/0.5ML Subcutaneous Solution Pen-injector] 4 mL 0    Sig: INJECT ONE SYRINGEFUL INTO THE SKIN ONCE WEEKLY     Off-Protocol Failed - 06/24/2023 10:46 AM      Failed - Medication not assigned to a protocol, review manually.      Passed - Valid encounter within last 12 months    Recent Outpatient Visits           3 weeks ago Class 3 severe obesity due to excess calories with serious comorbidity and body mass index (BMI) of 40.0 to 44.9 in adult [facility])   [facility] [place], [place] W, NP   10 months ago DM type 2 with diabetic peripheral neuropathy [facility])   [place] [place], [name], NP   1 year ago DM type 2 with diabetic peripheral neuropathy [facility])   [place] [place], [name], NP   1 year ago DM type 2 with diabetic peripheral neuropathy [facility])   [facility] [place], [name], NP   2 years ago Chronic right hip pain   [facility] [place], [name], NP       Future Appointments             In 5 months Baity, [name], NP [place] [facility], [facility]

## 2023-07-08 ENCOUNTER — Other Ambulatory Visit: Payer: Self-pay | Admitting: Internal Medicine

## 2023-07-09 NOTE — Telephone Encounter (Signed)
Requested Prescriptions  Pending Prescriptions Disp Refills   Insulin Glargine (BASAGLAR KWIKPEN) 100 UNIT/ML [Pharmacy Med Name: Basaglar KwikPen 100 UNIT/ML Subcutaneous Solution Pen-injector] 15 mL 2    Sig: INJECT 32 UNITS SUBCUTANEOUSLY ONCE DAILY . APPOINTMENT REQUIRED FOR FUTURE REFILLS     Endocrinology:  Diabetes - Insulins Passed - 07/08/2023  9:40 AM      Passed - HBA1C is between 0 and 7.9 and within 180 days    Hemoglobin A1C  Date Value Ref Range Status  06/01/2023 7.6 (A) 4.0 - 5.6 % Final  01/06/2017 8.0  Final   Hgb A1c MFr Bld  Date Value Ref Range Status  08/29/2022 8.5 (H) <5.7 % of total Hgb Final    Comment:    For someone without known diabetes, a hemoglobin A1c value of 6.5% or greater indicates that they may have  diabetes and this should be confirmed with a follow-up  test. . For someone with known diabetes, a value <7% indicates  that their diabetes is well controlled and a value  greater than or equal to 7% indicates suboptimal  control. A1c targets should be individualized based on  duration of diabetes, age, comorbid conditions, and  other considerations. . Currently, no consensus exists regarding use of hemoglobin A1c for diagnosis of diabetes for children. [name] - Valid encounter within last 6 months    Recent Outpatient Visits           1 month ago Class 3 severe obesity due to excess calories with serious comorbidity and body mass index (BMI) of 40.0 to 44.9 in adult [facility])   [place] [facility] [place], [place] W, NP   10 months ago DM type 2 with diabetic peripheral neuropathy [facility])   [place] [facility] [place], [name], NP   1 year ago DM type 2 with diabetic peripheral neuropathy [facility])   [place] [facility] [place], [name], NP   1 year ago DM type 2 with diabetic peripheral neuropathy [facility])   [place] [facility] [place], [name], NP   2 years ago  Chronic right hip pain   [place] [facility] [place], [name], NP       Future Appointments             In 4 months Baity, [name], NP [place] [facility], [facility]

## 2023-07-23 ENCOUNTER — Other Ambulatory Visit: Payer: Self-pay | Admitting: Student in an Organized Health Care Education/Training Program

## 2023-07-23 ENCOUNTER — Other Ambulatory Visit: Payer: Self-pay | Admitting: Internal Medicine

## 2023-07-23 DIAGNOSIS — I10 Essential (primary) hypertension: Secondary | ICD-10-CM

## 2023-07-23 DIAGNOSIS — E1142 Type 2 diabetes mellitus with diabetic polyneuropathy: Secondary | ICD-10-CM

## 2023-07-24 NOTE — Telephone Encounter (Signed)
Requested Prescriptions  Pending Prescriptions Disp Refills   amLODipine (NORVASC) 10 MG tablet [Pharmacy Med Name: amLODIPine Besylate 10 MG Oral Tablet] 30 tablet 3    Sig: TAKE 1 TABLET BY MOUTH ONCE DAILY . APPOINTMENT REQUIRED FOR FUTURE REFILLS     Cardiovascular: Calcium Channel Blockers 2 Passed - 07/23/2023  9:17 AM      Passed - Last BP in normal range    BP Readings from Last 1 Encounters:  06/01/23 136/80         Passed - Last Heart Rate in normal range    Pulse Readings from Last 1 Encounters:  06/01/23 72         Passed - Valid encounter within last 6 months    Recent Outpatient Visits           1 month ago Class 3 severe obesity due to excess calories with serious comorbidity and body mass index (BMI) of 40.0 to 44.9 in adult [facility])   [place] [facility] [place], [place] W, NP   10 months ago DM type 2 with diabetic peripheral neuropathy [facility])   [place] [facility] [place], [place], NP   1 year ago DM type 2 with diabetic peripheral neuropathy [facility])   [place] [facility] [place], [place], NP   1 year ago DM type 2 with diabetic peripheral neuropathy [facility])   [place] [facility] [place], [place] W, NP   2 years ago Chronic right hip pain   [place] [facility] [place], [name], NP       Future Appointments             In 4 months Baity, [name], NP  [facility], [facility]             carvedilol (COREG) 25 MG tablet [Pharmacy Med Name: Carvedilol 25 MG Oral Tablet] 60 tablet 3    Sig: TAKE 1 TABLET BY MOUTH TWICE DAILY WITH MEALS . APPOINTMENT REQUIRED FOR FUTURE REFILLS     Cardiovascular: Beta Blockers 3 Passed - 07/23/2023  9:17 AM      Passed - Cr in normal range and within 360 days    Creat  Date Value Ref Range Status  06/01/2023 0.84 0.50 - 1.05 mg/dL Final   Creatinine, Urine  Date Value Ref Range Status  08/29/2022 35 20 - 275  mg/dL Final         Passed - AST in normal range and within 360 days    AST  Date Value Ref Range Status  06/01/2023 16 10 - 35 U/L Final         Passed - ALT in normal range and within 360 days    ALT  Date Value Ref Range Status  06/01/2023 15 6 - 29 U/L Final         Passed - Last BP in normal range    BP Readings from Last 1 Encounters:  06/01/23 136/80         Passed - Last Heart Rate in normal range    Pulse Readings from Last 1 Encounters:  06/01/23 72         Passed - Valid encounter within last 6 months    Recent Outpatient Visits           1 month ago Class 3 severe obesity due to excess calories with serious comorbidity and body mass index (  BMI) of 40.0 to 44.9 in adult [facility])   [facility] [place], [name], NP   10 months ago DM type 2 with diabetic peripheral neuropathy [facility])   [place] [facility] [place], [name], NP   1 year ago DM type 2 with diabetic peripheral neuropathy [facility])   [place] [facility] [place], [name], NP   1 year ago DM type 2 with diabetic peripheral neuropathy [facility])   [facility] [place], [name], NP   2 years ago Chronic right hip pain   [place] [facility] [place], [name], NP       Future Appointments             In 4 months [place], [name], NP [place] [facility], [facility]             sitaGLIPtin (JANUVIA) 100 MG tablet [Pharmacy Med Name: Januvia 100 MG Oral Tablet] 30 tablet 3    Sig: Take 1 tablet by mouth once daily     Endocrinology:  Diabetes - DPP-4 Inhibitors Passed - 07/23/2023  9:17 AM      Passed - HBA1C is between 0 and 7.9 and within 180 days    Hemoglobin A1C  Date Value Ref Range Status  06/01/2023 7.6 (A) 4.0 - 5.6 % Final  01/06/2017 8.0  Final   Hgb A1c MFr Bld  Date Value Ref Range Status  08/29/2022 8.5 (H) <5.7 % of total Hgb Final    Comment:    For someone without  known diabetes, a hemoglobin A1c value of 6.5% or greater indicates that they may have  diabetes and this should be confirmed with a follow-up  test. . For someone with known diabetes, a value <7% indicates  that their diabetes is well controlled and a value  greater than or equal to 7% indicates suboptimal  control. A1c targets should be individualized based on  duration of diabetes, age, comorbid conditions, and  other considerations. . Currently, no consensus exists regarding use of hemoglobin A1c for diagnosis of diabetes for children. .          Passed - Cr in normal range and within 360 days    Creat  Date Value Ref Range Status  06/01/2023 0.84 0.50 - 1.05 mg/dL Final   Creatinine, Urine  Date Value Ref Range Status  08/29/2022 35 20 - 275 mg/dL Final         Passed - Valid encounter within last 6 months    Recent Outpatient Visits           1 month ago Class 3 severe obesity due to excess calories with serious comorbidity and body mass index (BMI) of 40.0 to 44.9 in adult [facility])   [place] [facility] [place], [place] W, NP   10 months ago DM type 2 with diabetic peripheral neuropathy [facility])   [facility] [place], [name], NP   1 year ago DM type 2 with diabetic peripheral neuropathy [facility])   [place] [facility] [place], [name], NP   1 year ago DM type 2 with diabetic peripheral neuropathy [facility])   [facility] [place], [name], NP   2 years ago Chronic right hip pain    [facility] [place], [name], [place]  Future Appointments             In 4 months Baity, [name], NP [place] [facility], [facility]             furosemide (LASIX) 40 MG tablet [Pharmacy Med Name: Furosemide 40 MG Oral Tablet] 30 tablet 3    Sig: TAKE 1 TABLET BY MOUTH ONCE DAILY . APPOINTMENT REQUIRED FOR FUTURE REFILLS     Cardiovascular:  Diuretics - Loop  Failed - 07/23/2023  9:17 AM      Failed - Mg Level in normal range and within 180 days    No results found for: "MG"       Passed - K in normal range and within 180 days    Potassium  Date Value Ref Range Status  06/01/2023 5.1 3.5 - 5.3 mmol/L Final         Passed - Ca in normal range and within 180 days    Calcium  Date Value Ref Range Status  06/01/2023 9.6 8.6 - 10.4 mg/dL Final         Passed - Na in normal range and within 180 days    Sodium  Date Value Ref Range Status  06/01/2023 141 135 - 146 mmol/L Final  11/30/2015 136 134 - 144 mmol/L Final         Passed - Cr in normal range and within 180 days    Creat  Date Value Ref Range Status  06/01/2023 0.84 0.50 - 1.05 mg/dL Final   Creatinine, Urine  Date Value Ref Range Status  08/29/2022 35 20 - 275 mg/dL Final         Passed - Cl in normal range and within 180 days    Chloride  Date Value Ref Range Status  06/01/2023 105 98 - 110 mmol/L Final         Passed - Last BP in normal range    BP Readings from Last 1 Encounters:  06/01/23 136/80         Passed - Valid encounter within last 6 months    Recent Outpatient Visits           1 month ago Class 3 severe obesity due to excess calories with serious comorbidity and body mass index (BMI) of 40.0 to 44.9 in adult [facility])   [place] [facility] [place], [place], NP   10 months ago DM type 2 with diabetic peripheral neuropathy [facility])   [place] [facility] [place], [place], NP   1 year ago DM type 2 with diabetic peripheral neuropathy [facility])   [place] [facility] [place], [place], NP   1 year ago DM type 2 with diabetic peripheral neuropathy [facility])   [facility] [place], [place] W, NP   2 years ago Chronic right hip pain   [place] [facility] [place], [name], NP       Future Appointments             In 4 months Baity, [name], NP  [facility], [facility]             empagliflozin (JARDIANCE) 25 MG TABS tablet [Pharmacy Med Name: Jardiance 25 MG Oral Tablet] 30 tablet 3    Sig: Take 1 tablet by mouth once daily     Endocrinology:  Diabetes - SGLT2 Inhibitors Passed - 07/23/2023  9:17  AM      Passed - Cr in normal range and within 360 days    Creat  Date Value Ref Range Status  06/01/2023 0.84 0.50 - 1.05 mg/dL Final   Creatinine, Urine  Date Value Ref Range Status  08/29/2022 35 20 - 275 mg/dL Final         Passed - HBA1C is between 0 and 7.9 and within 180 days    Hemoglobin A1C  Date Value Ref Range Status  06/01/2023 7.6 (A) 4.0 - 5.6 % Final  01/06/2017 8.0  Final   Hgb A1c MFr Bld  Date Value Ref Range Status  08/29/2022 8.5 (H) <5.7 % of total Hgb Final    Comment:    For someone without known diabetes, a hemoglobin A1c value of 6.5% or greater indicates that they may have  diabetes and this should be confirmed with a follow-up  test. . For someone with known diabetes, a value <7% indicates  that their diabetes is well controlled and a value  greater than or equal to 7% indicates suboptimal  control. A1c targets should be individualized based on  duration of diabetes, age, comorbid conditions, and  other considerations. . Currently, no consensus exists regarding use of hemoglobin A1c for diagnosis of diabetes for children. .          Passed - eGFR in normal range and within 360 days    GFR, Est African American  Date Value Ref Range Status  03/22/2019 102 > OR = 60 mL/min/1.14m2 Final   GFR, Est Non African American  Date Value Ref Range Status  03/22/2019 88 > OR = 60 mL/min/1.24m2 Final   GFR, Estimated  Date Value Ref Range Status  11/30/2021 >60 >60 mL/min Final    Comment:    (NOTE) Calculated using the CKD-EPI Creatinine Equation (2021)    eGFR  Date Value Ref Range Status  06/01/2023 76 > OR = 60 mL/min/1.94m2 Final         Passed - Valid encounter within last 6  months    Recent Outpatient Visits           1 month ago Class 3 severe obesity due to excess calories with serious comorbidity and body mass index (BMI) of 40.0 to 44.9 in adult [facility])   [place] [facility] [place], [place] W, NP   10 months ago DM type 2 with diabetic peripheral neuropathy [facility])   [place] [facility] [place], [place], NP   1 year ago DM type 2 with diabetic peripheral neuropathy [facility])   [place] [facility] [place], [place], NP   1 year ago DM type 2 with diabetic peripheral neuropathy [facility])   [place] [facility] [place], [place] W, NP   2 years ago Chronic right hip pain   [facility] [place], [name], NP       Future Appointments             In 4 months Baity, [name], NP [place] [facility], [facility]

## 2023-08-18 ENCOUNTER — Other Ambulatory Visit: Payer: Self-pay | Admitting: Internal Medicine

## 2023-08-18 NOTE — Telephone Encounter (Signed)
Requested Prescriptions  Pending Prescriptions Disp Refills   gabapentin (NEURONTIN) 300 MG capsule [Pharmacy Med Name: Gabapentin 300 MG Oral Capsule] 180 capsule 0    Sig: Take 1 capsule by mouth twice daily     Neurology: Anticonvulsants - gabapentin Passed - 08/18/2023  5:28 PM      Passed - Cr in normal range and within 360 days    Creat  Date Value Ref Range Status  06/01/2023 0.84 0.50 - 1.05 mg/dL Final   Creatinine, Urine  Date Value Ref Range Status  08/29/2022 35 20 - 275 mg/dL Final         Passed - Completed PHQ-2 or PHQ-9 in the last 360 days      Passed - Valid encounter within last 12 months    Recent Outpatient Visits           2 months ago Class 3 severe obesity due to excess calories with serious comorbidity and body mass index (BMI) of 40.0 to 44.9 in adult [facility])   [place] [facility] [place], [place] W, NP   11 months ago DM type 2 with diabetic peripheral neuropathy [facility])   [place] [facility] [place], [place], NP   1 year ago DM type 2 with diabetic peripheral neuropathy [facility])   [place] [facility] [place], [name], NP   1 year ago DM type 2 with diabetic peripheral neuropathy [facility])   [place] [facility] [place], [place] W, NP   2 years ago Chronic right hip pain   [place] [facility] [place], [name], NP       Future Appointments             In 3 months Baity, [name], NP  [facility], [facility]

## 2023-08-28 ENCOUNTER — Encounter: Payer: Self-pay | Admitting: Emergency Medicine

## 2023-08-28 ENCOUNTER — Ambulatory Visit
Admission: EM | Admit: 2023-08-28 | Discharge: 2023-08-28 | Disposition: A | Discharge status: Home / Self Care | Payer: Medicare Other | Attending: Emergency Medicine | Admitting: Emergency Medicine

## 2023-08-28 DIAGNOSIS — J029 Acute pharyngitis, unspecified: Secondary | ICD-10-CM | POA: Diagnosis not present

## 2023-08-28 DIAGNOSIS — J069 Acute upper respiratory infection, unspecified: Secondary | ICD-10-CM | POA: Diagnosis present

## 2023-08-28 LAB — GROUP A STREP BY PCR: Group A Strep by PCR: NOT DETECTED

## 2023-08-28 MED ORDER — AMOXICILLIN-POT CLAVULANATE 875-125 MG PO TABS: 1.0000 | ORAL_TABLET | Freq: Two times a day (BID) | ORAL | 0 refills | Status: AC

## 2023-08-28 MED ORDER — PROMETHAZINE-DM 6.25-15 MG/5ML PO SYRP: 5.0000 mL | ORAL_SOLUTION | Freq: Four times a day (QID) | ORAL | 0 refills | Status: DC | PRN

## 2023-08-28 MED ORDER — BENZONATATE 100 MG PO CAPS: 200.0000 mg | ORAL_CAPSULE | Freq: Three times a day (TID) | ORAL | 0 refills | Status: DC

## 2023-08-28 MED ORDER — IPRATROPIUM BROMIDE 0.06 % NA SOLN: 2.0000 | Freq: Four times a day (QID) | NASAL | 12 refills | Status: DC

## 2023-08-28 NOTE — ED Provider Notes (Addendum)
[facility]    CSN: [id number] Arrival date & time: 08/28/23  0816      History   Chief Complaint Chief Complaint  Patient presents with   Cough    HPI Stacy Martinez is a 67 y.o. female.   HPI  67 year old female with a past medical history significant for cardiomyopathy, hyperlipidemia, type 2 diabetes, hypertension, and chronic pain syndrome presents for evaluation of 1 week worth of respiratory symptoms that consist of a nonproductive cough, runny nose, nasal congestion, sore throat, and ear pain.  She reports that for the first 2 days of her illness she experienced a fever but that has since resolved.  She denies any shortness of breath or wheezing.  Past Medical History:  Diagnosis Date   Allergy    Arthritis    Diabetes mellitus without complication (HCC)    Enlarged heart    Hypertension     Patient Active Problem List   Diagnosis Date Noted   Bilateral primary osteoarthritis of knee 10/02/2022   Thrombocytosis 10/08/2021   Class 3 severe obesity due to excess calories with body mass index (BMI) of 40.0 to 44.9 in adult [facility]) 06/04/2021   Chronic pain syndrome 04/24/2021   Spondylosis without myelopathy or radiculopathy, lumbar region 01/15/2016   Hyperlipidemia associated with type 2 diabetes mellitus (HCC) 11/30/2015   Cardiomyopathy (HCC) 11/30/2015   BP (high blood pressure) 04/18/2015   DM type 2 with diabetic peripheral neuropathy (HCC) 04/18/2015    Past Surgical History:  Procedure Laterality Date   CESAREAN SECTION  1999, 2001   FRACTURE SURGERY     HERNIA REPAIR  2001, 2006   x2   REVERSE SHOULDER ARTHROPLASTY Right 08/27/2020   Procedure: RIGHT REVERSE SHOULDER ARTHROPLASTY,BICEPS TENODESIS;  Surgeon: Signa Kell, MD;  Location: [facility] ORS;  Service: Orthopedics;  Laterality: Right;  GENERAL WITH BLOCK   TOTAL HIP ARTHROPLASTY Right 11/29/2021   Procedure: TOTAL HIP ARTHROPLASTY ANTERIOR APPROACH;  Surgeon: Kennedy Bucker, MD;  Location:  [facility] ORS;  Service: Orthopedics;  Laterality: Right;   WRIST FRACTURE SURGERY Right 2013   metal rod and screws    OB History   No obstetric history on file.      Home Medications    Prior to Admission medications   Medication Sig Start Date End Date Taking? Authorizing Provider  amoxicillin-clavulanate (AUGMENTIN) 875-125 MG tablet Take 1 tablet by mouth every 12 (twelve) hours for 10 days. 08/28/23 09/07/23 Yes Becky Augusta, NP  benzonatate (TESSALON) 100 MG capsule Take 2 capsules (200 mg total) by mouth every 8 (eight) hours. 08/28/23  Yes Becky Augusta, NP  ipratropium (ATROVENT) 0.06 % nasal spray Place 2 sprays into both nostrils 4 (four) times daily. 08/28/23  Yes Becky Augusta, NP  promethazine-dextromethorphan (PROMETHAZINE-DM) 6.25-15 MG/5ML syrup Take 5 mLs by mouth 4 (four) times daily as needed. 08/28/23  Yes Becky Augusta, NP  acetaminophen (TYLENOL) 500 MG tablet Take 1-2 tablets (500-1,000 mg total) by mouth every 6 (six) hours as needed. 12/01/21   Anson Oregon, PA-C  amLODipine (NORVASC) 10 MG tablet TAKE 1 TABLET BY MOUTH ONCE DAILY . APPOINTMENT REQUIRED FOR FUTURE REFILLS 07/24/23   Lorre Munroe, NP  carvedilol (COREG) 25 MG tablet TAKE 1 TABLET BY MOUTH TWICE DAILY WITH MEALS . APPOINTMENT REQUIRED FOR FUTURE REFILLS 07/24/23   Lorre Munroe, NP  diclofenac (VOLTAREN) 50 MG EC tablet Take 1 tablet (50 mg total) by mouth 2 (two) times daily. 02/11/23   Edward Jolly,  MD  empagliflozin (JARDIANCE) 25 MG TABS tablet Take 1 tablet by mouth once daily 07/24/23   Lorre Munroe, NP  enalapril (VASOTEC) 20 MG tablet Take 1 tablet by mouth twice daily 04/17/21   Lorre Munroe, NP  furosemide (LASIX) 40 MG tablet TAKE 1 TABLET BY MOUTH ONCE DAILY . APPOINTMENT REQUIRED FOR FUTURE REFILLS 07/24/23   Lorre Munroe, NP  gabapentin (NEURONTIN) 300 MG capsule Take 1 capsule by mouth twice daily 08/18/23   Lorre Munroe, NP  glucose blood (RELION GLUCOSE TEST STRIPS) test  strip Use as instructed 11/11/19   Malfi, Jodelle Gross, FNP  Insulin Glargine (BASAGLAR KWIKPEN) 100 UNIT/ML INJECT 32 UNITS SUBCUTANEOUSLY ONCE DAILY . APPOINTMENT REQUIRED FOR FUTURE REFILLS 07/09/23   Lorre Munroe, NP  ondansetron (ZOFRAN) 4 MG tablet Take 1 tablet (4 mg total) by mouth every 6 (six) hours as needed for nausea. 12/01/21   Anson Oregon, PA-C  ReliOn Ultra Thin Lancets MISC 1 each by Does not apply route daily. 11/11/19   Malfi, Jodelle Gross, FNP  sitaGLIPtin (JANUVIA) 100 MG tablet Take 1 tablet by mouth once daily 07/24/23   Lorre Munroe, NP  tirzepatide [facility]) 2.5 MG/0.5ML Pen INJECT ONE [facility] INTO THE SKIN ONCE WEEKLY 06/26/23   Lorre Munroe, NP  tiZANidine (ZANAFLEX) 4 MG tablet Take 1 tablet (4 mg total) by mouth every 8 (eight) hours as needed for muscle spasms. 02/11/23   Edward Jolly, MD  ULTICARE MINI PEN NEEDLES 32G X 6 MM MISC 1 Bottle by Does not apply route as directed. 01/18/21   Lorre Munroe, NP    Family History Family History  Problem Relation Age of Onset   Diabetes Mother    Cancer Father    Mental illness Son     Social History Social History   Tobacco Use   Smoking status: Never   Smokeless tobacco: Never  Vaping Use   Vaping status: Never Used  Substance Use Topics   Alcohol use: No    Alcohol/week: 0.0 standard drinks of alcohol   Drug use: No     Allergies   Prednisone and Metformin and related   Review of Systems Review of Systems  Constitutional:  Positive for fever.  HENT:  Positive for congestion, ear pain, rhinorrhea and sore throat.   Respiratory:  Positive for cough. Negative for shortness of breath and wheezing.      Physical Exam Triage Vital Signs ED Triage Vitals  Encounter Vitals Group     BP      Systolic BP Percentile      Diastolic BP Percentile      Pulse      Resp      Temp      Temp src      SpO2      Weight      Height      Head Circumference      Peak Flow      Pain Score       Pain Loc      Pain Education      Exclude from Growth Chart    No data found.  Updated Vital Signs BP (!) 150/84 (BP Location: Right Arm)   Pulse 76   Temp 97.8 F (36.6 C) (Oral)   Resp 14   Ht 5\' 2"  (1.575 m)   Wt 220 lb (99.8 kg)   SpO2 97%   BMI 40.24 kg/m   Visual Acuity  Right Eye Distance:   Left Eye Distance:   Bilateral Distance:    Right Eye Near:   Left Eye Near:    Bilateral Near:     Physical Exam Vitals and nursing note reviewed.  Constitutional:      Appearance: Normal appearance. She is not ill-appearing.  HENT:     Head: Normocephalic and atraumatic.     Right Ear: Tympanic membrane, ear canal and external ear normal. There is no impacted cerumen.     Left Ear: Tympanic membrane, ear canal and external ear normal. There is no impacted cerumen.     Nose: Congestion and rhinorrhea present.     Comments: Nasal mucosa is erythematous and edematous with yellow discharge in both nares.    Mouth/Throat:     Mouth: Mucous membranes are moist.     Pharynx: Oropharynx is clear. Posterior oropharyngeal erythema present. No oropharyngeal exudate.     Comments: Tonsillar pillars are 1+ edematous and erythematous but free of exudate.  Posterior oropharynx demonstrates erythema. Cardiovascular:     Rate and Rhythm: Normal rate and regular rhythm.     Pulses: Normal pulses.     Heart sounds: Normal heart sounds. No murmur heard.    No friction rub. No gallop.  Pulmonary:     Effort: Pulmonary effort is normal.     Breath sounds: Normal breath sounds. No wheezing, rhonchi or rales.  Musculoskeletal:     Cervical back: Normal range of motion and neck supple. No tenderness.  Lymphadenopathy:     Cervical: No cervical adenopathy.  Skin:    General: Skin is warm and dry.     Capillary Refill: Capillary refill takes less than 2 seconds.     Findings: No rash.  Neurological:     General: No focal deficit present.     Mental Status: She is alert and oriented to  person, place, and time.      UC Treatments / Results  Labs (all labs ordered are listed, but only abnormal results are displayed) Labs Reviewed  GROUP A STREP BY PCR    EKG   Radiology No results found.  Procedures Procedures (including critical care time)  Medications Ordered in UC Medications - No data to display  Initial Impression / Assessment and Plan / UC Course  I have reviewed the triage vital signs and the nursing notes.  Pertinent labs & imaging results that were available during my care of the patient were reviewed by me and considered in my medical decision making (see chart for details).   Patient is a pleasant, nontoxic-appearing 67 year old female presenting for evaluation of 1 week worth of respiratory symptoms as outlined in HPI above.  In the exam room she is not in any acute distress and she is able to speak in full sentence without dyspnea or tachypnea.  Respiratory rate at triage was 14 and her oxygen saturation was 97%.  Patient is afebrile with an oral temp of 97.8.  Her exam does reveal inflammation of her upper respiratory tract as well as yellow nasal discharge.  Tonsillar hypertrophy and erythema but no appreciable exudate.  No anterior cervical lymphadenopathy lungs are clear to auscultation all fields.  Her exam is consistent with an upper respiratory infection.  I will order a strep PCR.  Given that she has been experiencing symptoms for a week I think a trial of antibiotics is warranted and I will discharge her home on Augmentin 875 mg twice daily for 10 days for her  URI.  Also Atrovent nasal spray of the congestion and Tessalon Perles and Promethazine DM cough syrup for cough and congestion.  Strep PCR is negative.   Final Clinical Impressions(s) / UC Diagnoses   Final diagnoses:  URI with cough and congestion  Acute pharyngitis, unspecified etiology     Discharge Instructions      Take the Augmentin twice daily for 10 days for treatment  of your URI and sore throat.  Gargle with warm salt water 2-3 times a day to soothe your throat, aid in pain relief, and aid in healing.  Take over-the-counter Tylenol and/or ibuprofen according to the package instructions as needed for pain.  You can also use Chloraseptic or Sucrets lozenges, 1 lozenge every 2 hours as needed for throat pain.  Use the Atrovent nasal spray, 2 squirts in each nostril every 6 hours, as needed for runny nose and postnasal drip.  Use the Tessalon Perles every 8 hours during the day.  Take them with a small sip of water.  They may give you some numbness to the base of your tongue or a metallic taste in your mouth, this is normal.  Use the Promethazine DM cough syrup at bedtime for cough and congestion.  It will make you drowsy so do not take it during the day.  Return for reevaluation or see your primary care provider for any new or worsening symptoms.      ED Prescriptions     Medication Sig Dispense Auth. Provider   amoxicillin-clavulanate (AUGMENTIN) 875-125 MG tablet Take 1 tablet by mouth every 12 (twelve) hours for 10 days. 20 tablet Becky Augusta, NP   benzonatate (TESSALON) 100 MG capsule Take 2 capsules (200 mg total) by mouth every 8 (eight) hours. 21 capsule Becky Augusta, NP   ipratropium (ATROVENT) 0.06 % nasal spray Place 2 sprays into both nostrils 4 (four) times daily. 15 mL Becky Augusta, NP   promethazine-dextromethorphan (PROMETHAZINE-DM) 6.25-15 MG/5ML syrup Take 5 mLs by mouth 4 (four) times daily as needed. 118 mL Becky Augusta, NP      PDMP not reviewed this encounter.   Becky Augusta, NP 08/28/23 2130    Becky Augusta, NP 08/28/23 2009

## 2023-08-28 NOTE — ED Triage Notes (Signed)
Patient c/o cough and chest congestion for a week.  Patient denies fevers.  Patient states that her family members were diagnosed with Bronchitis.

## 2023-08-28 NOTE — Discharge Instructions (Addendum)
Take the Augmentin twice daily for 10 days for treatment of your URI and sore throat.  Gargle with warm salt water 2-3 times a day to soothe your throat, aid in pain relief, and aid in healing.  Take over-the-counter Tylenol and/or ibuprofen according to the package instructions as needed for pain.  You can also use Chloraseptic or Sucrets lozenges, 1 lozenge every 2 hours as needed for throat pain.  Use the Atrovent nasal spray, 2 squirts in each nostril every 6 hours, as needed for runny nose and postnasal drip.  Use the Tessalon Perles every 8 hours during the day.  Take them with a small sip of water.  They may give you some numbness to the base of your tongue or a metallic taste in your mouth, this is normal.  Use the Promethazine DM cough syrup at bedtime for cough and congestion.  It will make you drowsy so do not take it during the day.  Return for reevaluation or see your primary care provider for any new or worsening symptoms.

## 2023-09-17 ENCOUNTER — Encounter: Payer: Self-pay | Admitting: Internal Medicine

## 2023-09-17 ENCOUNTER — Other Ambulatory Visit: Payer: Self-pay | Admitting: Internal Medicine

## 2023-09-17 NOTE — Telephone Encounter (Signed)
Requested medication (s) are due for refill today: yes  Requested medication (s) are on the active medication list: yes  Last refill:  06/25/24  Future visit scheduled: yes  Notes to clinic:  Medication not assigned to a protocol, review manually.      Requested Prescriptions  Pending Prescriptions Disp Refills   MOUNJARO 2.5 MG/0.5ML Pen [Pharmacy Med Name: Mounjaro 2.5 MG/0.5ML Subcutaneous Solution Pen-injector] 12 mL 0    Sig: INJECT 1 SYRINGEFUL (2.5MG ) SUBCUTANEOUSLY ONCE A WEEK     Off-Protocol Failed - 09/17/2023 10:50 AM      Failed - Medication not assigned to a protocol, review manually.      Passed - Valid encounter within last 12 months    Recent Outpatient Visits           3 months ago Class 3 severe obesity due to excess calories with serious comorbidity and body mass index (BMI) of 40.0 to 44.9 in adult [facility])   [place] [facility] [place], [name], NP   1 year ago DM type 2 with diabetic peripheral neuropathy [facility])   [place] [facility] [place], [name], NP   1 year ago DM type 2 with diabetic peripheral neuropathy [facility])   [place] [facility] [place], [name], NP   1 year ago DM type 2 with diabetic peripheral neuropathy [facility])   [place] [facility] [place], [name], NP   2 years ago Chronic right hip pain   [place] [facility] [place], [name], NP       Future Appointments             In 2 months [place], [name], NP  [facility], [facility]

## 2023-09-30 ENCOUNTER — Other Ambulatory Visit: Payer: Self-pay | Admitting: Student in an Organized Health Care Education/Training Program

## 2023-10-05 MED ORDER — TIRZEPATIDE 5 MG/0.5ML ~~LOC~~ SOAJ: 5.0000 mg | SUBCUTANEOUS | 0 refills | Status: DC

## 2023-10-20 ENCOUNTER — Ambulatory Visit
Payer: Medicare Other | Attending: Student in an Organized Health Care Education/Training Program | Admitting: Student in an Organized Health Care Education/Training Program

## 2023-10-20 ENCOUNTER — Encounter: Payer: Self-pay | Admitting: Student in an Organized Health Care Education/Training Program

## 2023-10-20 VITALS — BP 145/68 | HR 77 | Temp 97.3°F | Resp 16 | Ht 62.0 in | Wt 220.0 lb

## 2023-10-20 DIAGNOSIS — M170 Bilateral primary osteoarthritis of knee: Secondary | ICD-10-CM | POA: Diagnosis present

## 2023-10-20 DIAGNOSIS — M47816 Spondylosis without myelopathy or radiculopathy, lumbar region: Secondary | ICD-10-CM | POA: Insufficient documentation

## 2023-10-20 DIAGNOSIS — G894 Chronic pain syndrome: Secondary | ICD-10-CM | POA: Insufficient documentation

## 2023-10-20 NOTE — Progress Notes (Signed)
PROVIDER NOTE: Information contained herein reflects review and annotations entered in association with encounter. Interpretation of such information and data should be left to medically-trained personnel. Information provided to patient can be located elsewhere in the medical record under "Patient Instructions". Document created using STT-dictation technology, any transcriptional errors that may result from process are unintentional.    Patient: Stacy Martinez  Service Category: E/M  Provider: Edward Jolly, MD  DOB: 02/22/1956  DOS: 10/20/2023  Referring Provider: Lorre Munroe, NP  MRN: [id number]  Specialty: Interventional Pain Management  PCP: Lorre Munroe, NP  Type: Established Patient  Setting: Ambulatory outpatient    Location: Office  Delivery: Face-to-face     HPI  [name], a 68 y.o. year old female, is here today because of her Bilateral primary osteoarthritis of knee [M17.0]. Ms. Derderian's primary complain today is Back Pain and Knee Pain (Bilat, left is worse)  Pertinent problems: Ms. Abascal has Spondylosis without myelopathy or radiculopathy, lumbar region on their pertinent problem list. Pain Assessment: Severity of Chronic pain is reported as a 6 /10. Location: Back Lower/down back of legs bilat to knees. Onset: More than a month ago. Quality: Dull, Burning. Timing: Constant. Modifying factor(s): meds, procedure. Vitals:  height is 5\' 2"  (1.575 m) and weight is 220 lb (99.8 kg). Her temperature is 97.3 F (36.3 C) (abnormal). Her blood pressure is 145/68 (abnormal) and her pulse is 77. Her respiration is 16 and oxygen saturation is 100%.  BMI: Estimated body mass index is 40.24 kg/m as calculated from the following:   Height as of this encounter: 5\' 2"  (1.575 m).   Weight as of this encounter: 220 lb (99.8 kg). Last encounter: 04/08/2023. Last procedure: 02/11/2023.  Reason for encounter: patient-requested evaluation.   Discussed the use of AI scribe software for clinical note  transcription with the patient, who gave verbal consent to proceed.  History of Present Illness   Stacy Martinez is a 68 year old female who presents for consideration of repeat nerve ablation procedures for chronic knee (genicular nerve RFA) and back pain (lumbar medial branch RFA).  She experiences significant pain in her knees, particularly the left knee, which is severe OA. She was previously treated with genicular nerve ablation on February 11, 2023, which provided relief (60% for 6 months), but her left knee remains painful and is described as 'bone on bone'.  She has severe low back pain, preventing her from standing for more than ten minutes at a time. The pain is bilateral but more pronounced on the right side. She has had previous lumbar ablations, including a right-sided L3, L4, L5 ablation on November 26, 2022, and a left-sided ablation in July 2022.  Both of these provided 80% pain relief for 10 to 12 months.  She is on Mounjaro to aid in weight loss prior to her knee surgery and has lost 24 pounds.  She is concerned about her ability to care for her two young grandchildren, a two-year-old and a nine-month-old, due to her pain and mobility issues.       ROS  Constitutional: Denies any fever or chills Gastrointestinal: No reported hemesis, hematochezia, vomiting, or acute GI distress Musculoskeletal:  Bilateral knee pain, low back pain Neurological: No reported episodes of acute onset apraxia, aphasia, dysarthria, agnosia, amnesia, paralysis, loss of coordination, or loss of consciousness  Medication Review  Basaglar KwikPen, Insulin Pen Needle, ReliOn Ultra Thin Lancets, acetaminophen, amLODipine, benzonatate, carvedilol, diclofenac, empagliflozin, enalapril, furosemide, gabapentin, glucose  blood, ipratropium, ondansetron, promethazine-dextromethorphan, sitaGLIPtin, tiZANidine, and tirzepatide  History Review  Allergy: Ms. Mckinstry is allergic to prednisone and metformin and related. Drug:  Ms. Pak  reports no history of drug use. Alcohol:  reports no history of alcohol use. Tobacco:  reports that she has never smoked. She has never used smokeless tobacco. Social: Ms. Vanburen  reports that she has never smoked. She has never used smokeless tobacco. She reports that she does not drink alcohol and does not use drugs. Medical:  has a past medical history of Allergy, Arthritis, Diabetes mellitus without complication (HCC), Enlarged heart, and Hypertension. Surgical: Ms. Gunnels  has a past surgical history that includes Wrist fracture surgery (Right, 2013); Cesarean section (1999, 2001); Hernia repair (2001, 2006); Fracture surgery; Reverse shoulder arthroplasty (Right, 08/27/2020); and Total hip arthroplasty (Right, 11/29/2021). Family: family history includes Cancer in her father; Diabetes in her mother; Mental illness in her son.  Laboratory Chemistry Profile   Renal Lab Results  Component Value Date   BUN 16 06/01/2023   CREATININE 0.84 06/01/2023   LABCREA 35 08/29/2022   BCR SEE NOTE: 06/01/2023   GFRAA 102 03/22/2019   GFRNONAA >60 11/30/2021    Hepatic Lab Results  Component Value Date   AST 16 06/01/2023   ALT 15 06/01/2023   ALBUMIN 3.8 11/15/2021   ALKPHOS 95 11/15/2021   LIPASE 19 12/18/2013    Electrolytes Lab Results  Component Value Date   NA 141 06/01/2023   K 5.1 06/01/2023   CL 105 06/01/2023   CALCIUM 9.6 06/01/2023    Bone No results found for: "VD25OH", "VD125OH2TOT", "WU9811BJ4", "NW2956OZ3", "25OHVITD1", "25OHVITD2", "25OHVITD3", "TESTOFREE", "TESTOSTERONE"  Inflammation (CRP: Acute Phase) (ESR: Chronic Phase) Lab Results  Component Value Date   LATICACIDVEN 1.72 01/02/2014         Note: Above Lab results reviewed.  Recent Imaging Review  DG PAIN CLINIC C-ARM 1-60 MIN NO REPORT Fluoro was used, but no Radiologist interpretation will be provided.  Please refer to "NOTES" tab for provider progress note. Note: Reviewed        Physical Exam   General appearance: Well nourished, well developed, and well hydrated. In no apparent acute distress Mental status: Alert, oriented x 3 (person, place, & time)       Respiratory: No evidence of acute respiratory distress Eyes: PERLA Vitals: BP (!) 145/68   Pulse 77   Temp (!) 97.3 F (36.3 C)   Resp 16   Ht 5\' 2"  (1.575 m)   Wt 220 lb (99.8 kg)   SpO2 100%   BMI 40.24 kg/m  BMI: Estimated body mass index is 40.24 kg/m as calculated from the following:   Height as of this encounter: 5\' 2"  (1.575 m).   Weight as of this encounter: 220 lb (99.8 kg). Ideal: Ideal body weight: 50.1 kg (110 lb 7.2 oz) Adjusted ideal body weight: 70 kg (154 lb 4.3 oz)  Low back pain, worse with lumbar facet loading  Bilateral knee pain, worse with weightbearing  5 out of 5 strength bilateral lower extremity: Plantar flexion, dorsiflexion, knee flexion, knee extension.   Assessment   Diagnosis Status  1. Bilateral primary osteoarthritis of knee   2. Lumbar spondylosis   3. Facet syndrome, lumbar   4. Chronic pain syndrome    Having a Flare-up Having a Flare-up Having a Flare-up   Updated Problems: No problems updated.  Plan of Care  1. Bilateral primary osteoarthritis of knee (Primary) - Radiofrequency,Genicular; Future  2. Lumbar  spondylosis - Radiofrequency,Lumbar; Future  3. Facet syndrome, lumbar - Radiofrequency,Lumbar; Future  4. Chronic pain syndrome - Radiofrequency,Genicular; Future   Knee Osteoarthritis and associated bilateral knee pain Chronic left knee pain is due to bone-on-bone osteoarthritis. Greggory Keen is being used for weight loss, resulting in a 24-pound reduction in preparation for knee surgery.  Previous genicular nerve RFA was helpful from an analgesic and functional standpoint.  Patient would like to repeat.  Chronic Low Back Pain related to lumbar facet arthropathy Severe chronic low back pain is worse on the right side, with bilateral involvement. Standing  is difficult for more than ten minutes, necessitating sitting for activities. A right-sided lumbar ablation at L3, L4, L5 was performed on November 26, 2022, and a left-sided ablation in July 2022 which provided 80% pain relief for 10 to 12 months. The risks and benefits of the procedure were discussed. Schedule bilateral lumbar ablation a few weeks after knee ablation.    Ms. BREELLA VANOSTRAND has a current medication list which includes the following long-term medication(s): amlodipine, carvedilol, enalapril, furosemide, gabapentin, basaglar kwikpen, ipratropium, and sitagliptin.  Pharmacotherapy (Medications Ordered): No orders of the defined types were placed in this encounter.  Orders:  Orders Placed This Encounter  Procedures   Radiofrequency,Genicular    Standing Status:   Future    Expiration Date:   01/17/2024    Scheduling Instructions:     Side(s): Bilateral Knee     Level(s): Superior-Lateral, Superior-Medial, and Inferior-Medial Genicular Nerve(s)     Sedation: ECT     Scheduling Timeframe: As soon as pre-approved    Where will this procedure be performed?:   [facility]   Radiofrequency,Lumbar    Standing Status:   Future    Expiration Date:   01/17/2024    Scheduling Instructions:     Side(s): Bilateral     Level(s): L2, L3, L4, L5, Medial Branch Nerve(s)     Sedation: With Sedation     Scheduling Timeframe: As soon as pre-approved    Where will this procedure be performed?:   [facility]   Follow-up plan:   Return in about 27 days (around 11/16/2023) for B/L GN RFA (block 40 mins), ECT.      Status post L2, L3, L4, L5 facet medial branch nerve block on 03/21/2019: Helpful.  Status post right shoulder steroid injection #2 on 03/09/2019 which was not helpful, having worsening shoulder pain.  Right L3, L4, L5 RFA 02/25/21, Left 03/20/21. B/L GN RFA 02/11/23           Recent Visits No visits were found meeting these conditions. Showing recent visits within past  90 days and meeting all other requirements Today's Visits Date Type Provider Dept  10/20/23 Office Visit Edward Jolly, MD [facility]  Showing today's visits and meeting all other requirements Future Appointments Date Type Provider Dept  11/16/23 Appointment Edward Jolly, MD [facility]  Showing future appointments within next 90 days and meeting all other requirements  I discussed the assessment and treatment plan with the patient. The patient was provided an opportunity to ask questions and all were answered. The patient agreed with the plan and demonstrated an understanding of the instructions.  Patient advised to call back or seek an in-person evaluation if the symptoms or condition worsens.  Duration of encounter: .  Total time on encounter, as per AMA guidelines included both the face-to-face and non-face-to-face time personally spent by the physician and/or other qualified health care professional(s)  on the day of the encounter (includes time in activities that require the physician or other qualified health care professional and does not include time in activities normally performed by clinical staff). Physician's time may include the following activities when performed: Preparing to see the patient (e.g., pre-charting review of records, searching for previously ordered imaging, lab work, and nerve conduction tests) Review of prior analgesic pharmacotherapies. Reviewing PMP Interpreting ordered tests (e.g., lab work, imaging, nerve conduction tests) Performing post-procedure evaluations, including interpretation of diagnostic procedures Obtaining and/or reviewing separately obtained history Performing a medically appropriate examination and/or evaluation Counseling and educating the patient/family/caregiver Ordering medications, tests, or procedures Referring and communicating with other health care professionals (when not separately  reported) Documenting clinical information in the electronic or other health record Independently interpreting results (not separately reported) and communicating results to the patient/ family/caregiver Care coordination (not separately reported)  Note by: Edward Jolly, MD Date: 10/20/2023; Time: 10:01 AM

## 2023-10-20 NOTE — Patient Instructions (Signed)

## 2023-10-21 ENCOUNTER — Other Ambulatory Visit: Payer: Self-pay | Admitting: *Deleted

## 2023-10-21 ENCOUNTER — Telehealth: Payer: Self-pay | Admitting: Student in an Organized Health Care Education/Training Program

## 2023-10-21 ENCOUNTER — Telehealth: Payer: Self-pay

## 2023-10-21 DIAGNOSIS — M47816 Spondylosis without myelopathy or radiculopathy, lumbar region: Secondary | ICD-10-CM

## 2023-10-21 DIAGNOSIS — G894 Chronic pain syndrome: Secondary | ICD-10-CM

## 2023-10-21 DIAGNOSIS — M170 Bilateral primary osteoarthritis of knee: Secondary | ICD-10-CM

## 2023-10-21 MED ORDER — TIZANIDINE HCL 4 MG PO TABS: 4.0000 mg | ORAL_TABLET | Freq: Three times a day (TID) | ORAL | 1 refills | Status: DC | PRN

## 2023-10-21 MED ORDER — DICLOFENAC SODIUM 50 MG PO TBEC
50.0000 mg | DELAYED_RELEASE_TABLET | Freq: Two times a day (BID) | ORAL | 2 refills | Status: DC
Start: 2023-10-21 — End: 2023-12-01

## 2023-10-21 NOTE — Telephone Encounter (Signed)
Per Medicare, even if she has had previous RFA's, if it has been longer than 2 years since her last diagnostic lumbar medial branch nerve blocks, she will have to repeat 2 of those before requesting another RFA.  Do you want to order the lumbar facets? I will need to get authorization for those as well.

## 2023-10-21 NOTE — Telephone Encounter (Signed)
Patient came in for visit yesterday, meds still not sent in for Tizanidine and diclofenac Please assist patient

## 2023-10-21 NOTE — Telephone Encounter (Signed)
Rx request sent to Dr. Lateef 

## 2023-10-21 NOTE — Telephone Encounter (Signed)
 Patient informed.

## 2023-10-28 ENCOUNTER — Telehealth: Payer: Self-pay

## 2023-10-28 NOTE — Telephone Encounter (Signed)
 In regards to the previous RFA procedure you ordered. I had said that she needed 2 facets first but I was wrong. The rule is that you have to repeat the MNB if it has been greater than 2 years since the last RFA. So I was able to get the RFA authorized.

## 2023-10-29 ENCOUNTER — Other Ambulatory Visit: Payer: Self-pay

## 2023-10-29 DIAGNOSIS — E1142 Type 2 diabetes mellitus with diabetic polyneuropathy: Secondary | ICD-10-CM

## 2023-10-29 MED ORDER — SITAGLIPTIN PHOSPHATE 100 MG PO TABS
ORAL_TABLET | ORAL | 3 refills | Status: DC
Start: 2023-10-29 — End: 2024-03-23

## 2023-10-29 MED ORDER — EMPAGLIFLOZIN 25 MG PO TABS: 25.0000 mg | ORAL_TABLET | Freq: Every day | ORAL | 3 refills | Status: DC

## 2023-10-30 NOTE — Progress Notes (Addendum)
 PA started  [employer] (Key: [id number]) Rx #: [id number] Need Help? Call us at [phone] Status New (Not sent to plan) Drug Basaglar KwikPen 100UNIT/ML pen-injectors ePA cloud logo Form [facility] Medicare Electronic Prior Authorization Request Form [phone] NCPDP) Original Claim Info [id number]  Approved 10/16/23-until further notice

## 2023-11-16 ENCOUNTER — Encounter: Payer: Self-pay | Admitting: Student in an Organized Health Care Education/Training Program

## 2023-11-16 ENCOUNTER — Ambulatory Visit
Payer: Medicare Other | Attending: Student in an Organized Health Care Education/Training Program | Admitting: Student in an Organized Health Care Education/Training Program

## 2023-11-16 ENCOUNTER — Ambulatory Visit
Admission: RE | Admit: 2023-11-16 | Discharge: 2023-11-16 | Disposition: A | Discharge status: Home / Self Care | Source: Ambulatory Visit | Attending: Student in an Organized Health Care Education/Training Program | Admitting: Student in an Organized Health Care Education/Training Program

## 2023-11-16 DIAGNOSIS — G894 Chronic pain syndrome: Secondary | ICD-10-CM | POA: Diagnosis not present

## 2023-11-16 DIAGNOSIS — M170 Bilateral primary osteoarthritis of knee: Secondary | ICD-10-CM | POA: Insufficient documentation

## 2023-11-16 MED ORDER — LIDOCAINE HCL 2 % IJ SOLN
INTRAMUSCULAR | Status: AC
  Filled 2023-11-16: qty 20

## 2023-11-16 MED ORDER — DIAZEPAM 5 MG PO TABS
10.0000 mg | ORAL_TABLET | ORAL | Status: AC
  Administered 2023-11-16: 10 mg via ORAL

## 2023-11-16 MED ORDER — LIDOCAINE HCL 2 % IJ SOLN
20.0000 mL | Freq: Once | INTRAMUSCULAR | Status: AC
  Administered 2023-11-16: 400 mg

## 2023-11-16 MED ORDER — DIAZEPAM 5 MG PO TABS
ORAL_TABLET | ORAL | Status: AC
  Filled 2023-11-16: qty 2

## 2023-11-16 MED ORDER — ROPIVACAINE HCL 2 MG/ML IJ SOLN
INTRAMUSCULAR | Status: AC
  Filled 2023-11-16: qty 20

## 2023-11-16 MED ORDER — MIDAZOLAM HCL 5 MG/5ML IJ SOLN
INTRAMUSCULAR | Status: AC
  Filled 2023-11-16: qty 5

## 2023-11-16 MED ORDER — FENTANYL CITRATE (PF) 100 MCG/2ML IJ SOLN: 25.0000 ug | INTRAMUSCULAR | Status: DC | PRN

## 2023-11-16 MED ORDER — MIDAZOLAM HCL 5 MG/5ML IJ SOLN: 0.5000 mg | Freq: Once | INTRAMUSCULAR | Status: DC

## 2023-11-16 MED ORDER — LACTATED RINGERS IV SOLN: Freq: Once | INTRAVENOUS | Status: DC

## 2023-11-16 MED ORDER — ROPIVACAINE HCL 2 MG/ML IJ SOLN
18.0000 mL | Freq: Once | INTRAMUSCULAR | Status: AC
  Administered 2023-11-16: 18 mL via PERINEURAL

## 2023-11-16 MED ORDER — DEXAMETHASONE SODIUM PHOSPHATE 10 MG/ML IJ SOLN
20.0000 mg | Freq: Once | INTRAMUSCULAR | Status: AC
  Administered 2023-11-16: 20 mg

## 2023-11-16 MED ORDER — DEXAMETHASONE SODIUM PHOSPHATE 10 MG/ML IJ SOLN
INTRAMUSCULAR | Status: AC
  Filled 2023-11-16: qty 2

## 2023-11-16 MED ORDER — FENTANYL CITRATE (PF) 100 MCG/2ML IJ SOLN
INTRAMUSCULAR | Status: AC
  Filled 2023-11-16: qty 2

## 2023-11-16 NOTE — Progress Notes (Signed)
 PROVIDER NOTE: Interpretation of information contained herein should be left to medically-trained personnel. Specific patient instructions are provided elsewhere under "Patient Instructions" section of medical record. This document was created in part using STT-dictation technology, any transcriptional errors that may result from this process are unintentional.  Patient: Stacy Martinez Type: Established DOB: 1955/12/28 MRN: [id number] PCP: Lorre Munroe, NP  Service: Procedure DOS: 11/16/2023 Setting: Ambulatory Location: Ambulatory outpatient facility Delivery: Face-to-face Provider: Edward Jolly, MD Specialty: Interventional Pain Management Specialty designation: 09 Location: Outpatient facility Ref. Prov.: Lorre Munroe, NP       Interventional Therapy   Primary Reason for Visit: Interventional Pain Management Treatment. CC: Back Pain    Procedure:          Anesthesia, Analgesia, Anxiolysis:  Type: Therapeutic Superolateral, Superomedial, and Inferomedial, Genicular Nerve Radiofrequency Ablation (destruction).   #3 Region: Lateral, Anterior, and Medial aspects of the knee joint, above and below the knee joint proper. Level: Superior and inferior to the knee joint. Laterality: Bilateral  Anesthesia: Local (1-2% Lidocaine)  Sedation: Moderate  Guidance: Fluoroscopy           Position: Supine   Indications: 1. Bilateral primary osteoarthritis of knee   2. Chronic pain syndrome    [name] has been dealing with the above chronic pain for longer than three months and has either failed to respond, was unable to tolerate, or simply did not get enough benefit from other more conservative therapies including, but not limited to: 1. Over-the-counter medications 2. Anti-inflammatory medications 3. Muscle relaxants 4. Membrane stabilizers 5. Opioids 6. Physical therapy and/or chiropractic manipulation 7. Modalities (Heat, ice, etc.) 8. Invasive techniques such as nerve blocks. Ms.  Stacy Martinez has attained more than 50% relief of the pain from a series of diagnostic injections conducted in separate occasions.  Pain Score: Pre-procedure: 6 /10 Post-procedure: 3 /10    Pre-op H&P Assessment:  [name] is a 68 y.o. (year old), female patient, seen today for interventional treatment. 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 [name] has a current medication list which includes the following prescription(s): acetaminophen, amlodipine, benzonatate, carvedilol, diclofenac, empagliflozin, enalapril, furosemide, gabapentin, relion glucose test strips, basaglar kwikpen, ipratropium, ondansetron, promethazine-dextromethorphan, relion ultra thin lancets, sitagliptin, tirzepatide, tizanidine, and ulticare mini pen needles, and the following Facility-Administered Medications: lactated ringers. Her primarily concern today is the Back Pain  Initial Vital Signs:  Pulse/HCG Rate:  ECG Heart Rate: 73 Temp: 98 F (36.7 C) Resp: 16 BP: (!) 156/72 SpO2: 100 %  BMI: Estimated body mass index is 40.24 kg/m as calculated from the following:   Height as of this encounter: 5\' 2"  (1.575 m).   Weight as of this encounter: 220 lb (99.8 kg).  Risk Assessment: Allergies: Reviewed. She is allergic to prednisone and metformin and related.  Allergy Precautions: None required Coagulopathies: Reviewed. None identified.  Blood-thinner therapy: None at this time Active Infection(s): Reviewed. None identified. [name] is afebrile  Site Confirmation: [name] was asked to confirm the procedure and laterality before marking the site Procedure checklist: Completed Consent: Before the procedure and under the influence of no sedative(s), amnesic(s), or anxiolytics, the patient was informed of the  treatment options, risks and possible complications. To fulfill our ethical and legal obligations, as recommended by the American Medical Association's Code of Ethics, I have  informed the patient of my clinical impression; the nature and purpose of the treatment or procedure; the risks, benefits, and possible complications of the intervention; the alternatives, including doing nothing; the risk(s) and benefit(s) of the alternative treatment(s) or procedure(s); and the risk(s) and benefit(s) of doing nothing. The patient was provided information about the general risks and possible complications associated with the procedure. These may include, but are not limited to: failure to achieve desired goals, infection, bleeding, organ or nerve damage, allergic reactions, paralysis, and death. In addition, the patient was informed of those risks and complications associated to the procedure, such as failure to decrease pain; infection; bleeding; organ or nerve damage with subsequent damage to sensory, motor, and/or autonomic systems, resulting in permanent pain, numbness, and/or weakness of one or several areas of the body; allergic reactions; (i.e.: anaphylactic reaction); and/or death. Furthermore, the patient was informed of those risks and complications associated with the medications. These include, but are not limited to: allergic reactions (i.e.: anaphylactic or anaphylactoid reaction(s)); adrenal axis suppression; blood sugar elevation that in diabetics may result in ketoacidosis or comma; water retention that in patients with history of congestive heart failure may result in shortness of breath, pulmonary edema, and decompensation with resultant heart failure; weight gain; swelling or edema; medication-induced neural toxicity; particulate matter embolism and blood vessel occlusion with resultant organ, and/or nervous system infarction; and/or aseptic necrosis of one or more joints. Finally, the patient was  informed that Medicine is not an exact science; therefore, there is also the possibility of unforeseen or unpredictable risks and/or possible complications that may result in a catastrophic outcome. The patient indicated having understood very clearly. We have given the patient no guarantees and we have made no promises. Enough time was given to the patient to ask questions, all of which were answered to the patient's satisfaction. Ms. Boardman has indicated that she wanted to continue with the procedure. Attestation: I, the ordering provider, attest that I have discussed with the patient the benefits, risks, side-effects, alternatives, likelihood of achieving goals, and potential problems during recovery for the procedure that I have provided informed consent. Date  Time: 11/16/2023  8:07 AM  Pre-Procedure Preparation:  Monitoring: As per clinic protocol. Respiration, ETCO2, SpO2, BP, heart rate and rhythm monitor placed and checked for adequate function Safety Precautions: Patient was assessed for positional comfort and pressure points before starting the procedure. Time-out: I initiated and conducted the "Time-out" before starting the procedure, as per protocol. The patient was asked to participate by confirming the accuracy of the "Time Out" information. Verification of the correct person, site, and procedure were performed and confirmed by me, the nursing staff, and the patient. "Time-out" conducted as per Joint Commission's Universal Protocol (UP.01.01.01). Time: 0845 Start Time: 0845 hrs.  Description of Procedure:          Target Area: For Genicular Nerve radiofrequency ablation (destruction), the targets are: the superolateral genicular nerve, located in the lateral distal portion of the femoral shaft as it curves to form the lateral epicondyle, in the region of the distal femoral metaphysis; the superomedial genicular nerve, located in the medial distal portion of the femoral shaft as it curves to  form the medial epicondyle; and the inferomedial genicular nerve, located in the medial, proximal portion of the tibial shaft, as it curves to form the medial epicondyle, in the region of the proximal tibial metaphysis. Approach: Anterior, ipsilateral approach. Area Prepped: Entire knee area, from mid-thigh to mid-shin, lateral, anterior, and medial  aspects. DuraPrep (Iodine Povacrylex [0.7% available iodine] and Isopropyl Alcohol, 74% w/w) Safety Precautions: Aspiration looking for blood return was conducted prior to all injections. At no point did we inject any substances, as a needle was being advanced. No attempts were made at seeking any paresthesias. Safe injection practices and needle disposal techniques used. Medications properly checked for expiration dates. SDV (single dose vial) medications used. Description of the Procedure: Protocol guidelines were followed. The patient was placed in position over the procedure table. The target area was identified and the area prepped in the usual manner. The skin and muscle were infiltrated with local anesthetic. Appropriate amount of time allowed to pass for local anesthetics to take effect. Radiofrequency needles were introduced to the target area using fluoroscopic guidance. Using the NeuroTherm NT1100 Radiofrequency Generator, sensory stimulation using 50 Hz was used to locate & identify the nerve, making sure that the needle was positioned such that there was no sensory stimulation below 0.3 V or above 0.7 V. Stimulation using 2 Hz was used to evaluate the motor component. Care was taken not to lesion any nerves that demonstrated motor stimulation of the lower extremities at an output of less than 2.5 times that of the sensory threshold, or a maximum of 2.0 V. Once satisfactory placement of the needles was achieved, the numbing solution was slowly injected after negative aspiration. After waiting for at least 2 minutes, the ablation was performed at 80  degrees C for 60 seconds, using regular Radiofrequency settings. Once the procedure was completed, the needles were then removed and the area cleansed, making sure to leave some of the prepping solution back to take advantage of its long term bactericidal properties. Intra-operative Compliance: Compliant      Vitals:   11/16/23 0906 11/16/23 0911 11/16/23 0918 11/16/23 0925  BP: (!) 148/81 (!) 161/74 (!) 152/75 (!) 145/75  Resp: 16 18 16    Temp:      SpO2: 95% 99% 99% 100%  Weight:      Height:        Start Time: 0845 hrs. End Time: 0918 hrs. Materials & Medications:  Needle(s) Type: Teflon-coated, curved tip, Radiofrequency needle(s) Gauge: 22G Length: 10cm Medication(s): Please see orders for medications and dosing details.  9 cc solution made of 8cc of 0.2% ropivacaine, 1 cc of Decadron 10 mg/cc. 3 cc injected at each level for the left genicular nerves after sensorimotor testing 9 cc solution made of 8cc of 0.2% ropivacaine, 1 cc of Decadron 10 mg/cc. 3 cc injected at each level for the right genicular nerves after sensorimotor testing  Imaging Guidance (Non-Spinal):          Type of Imaging Technique: Fluoroscopy Guidance (Non-Spinal) Indication(s): Assistance in needle guidance and placement for procedures requiring needle placement in or near specific anatomical locations not easily accessible without such assistance. Exposure Time: Please see nurses notes. Contrast: Before injecting any contrast, we confirmed that the patient did not have an allergy to iodine, shellfish, or radiological contrast. Once satisfactory needle placement was completed at the desired level, radiological contrast was injected. Contrast injected under live fluoroscopy. No contrast complications. See chart for type and volume of contrast used. Fluoroscopic Guidance: I was personally present during the use of fluoroscopy. "Tunnel Vision Technique" used to obtain the best possible view of the target area.  Parallax error corrected before commencing the procedure. "Direction-depth-direction" technique used to introduce the needle under continuous pulsed fluoroscopy. Once target was reached, antero-posterior, oblique, and lateral fluoroscopic projection used  confirm needle placement in all planes. Images permanently stored in EMR. Interpretation: I personally interpreted the imaging intraoperatively. Adequate needle placement confirmed in multiple planes. Appropriate spread of contrast into desired area was observed. No evidence of afferent or efferent intravascular uptake. Permanent images saved into the patient's record.  Antibiotic Prophylaxis:   Anti-infectives (From admission, onward)    None      Indication(s): None identified  Post-operative Assessment:  Post-procedure Vital Signs:  Pulse/HCG Rate:  70 Temp: 98 F (36.7 C) Resp: 16 BP: (!) 145/75 SpO2: 100 %  EBL: None  Complications: No immediate post-treatment complications observed by team, or reported by patient.  Note: The patient tolerated the entire procedure well. A repeat set of vitals were taken after the procedure and the patient was kept under observation following institutional policy, for this type of procedure. Post-procedural neurological assessment was performed, showing return to baseline, prior to discharge. The patient was provided with post-procedure discharge instructions, including a section on how to identify potential problems. Should any problems arise concerning this procedure, the patient was given instructions to immediately contact us, at any time, without hesitation. In any case, we plan to contact the patient by telephone for a follow-up status report regarding this interventional procedure.  Comments:  No additional relevant information.  Plan of Care (POC)  Orders:  Orders Placed This Encounter  Procedures   DG PAIN CLINIC C-ARM 1-60 MIN NO REPORT    Intraoperative interpretation by procedural  physician at [facility].    Standing Status:   Standing    Number of Occurrences:   1    Reason for exam::   Assistance in needle guidance and placement for procedures requiring needle placement in or near specific anatomical locations not easily accessible without such assistance.    Medications ordered for procedure: PMP checked and reviewed. Meds ordered this encounter  Medications   lidocaine (XYLOCAINE) 2 % (with pres) injection 400 mg   lactated ringers infusion   DISCONTD: midazolam (VERSED) 5 MG/5ML injection 0.5-2 mg    Make sure Flumazenil is available in the pyxis when using this medication. If oversedation occurs, administer 0.2 mg IV over 15 sec. If after 45 sec no response, administer 0.2 mg again over 1 min; may repeat at 1 min intervals; not to exceed 4 doses (1 mg)   DISCONTD: fentaNYL (SUBLIMAZE) injection 25-50 mcg    Make sure Narcan is available in the pyxis when using this medication. In the event of respiratory depression (RR< 8/min): Titrate NARCAN (naloxone) in increments of 0.1 to 0.2 mg IV at 2-3 minute intervals, until desired degree of reversal.   ropivacaine (PF) 2 mg/mL (0.2%) (NAROPIN) injection 18 mL   dexamethasone (DECADRON) injection 20 mg   diazepam (VALIUM) tablet 10 mg    Make sure Flumazenil is available in the pyxis when using this medication. If oversedation occurs, administer 0.2 mg IV over 15 sec. If after 45 sec no response, administer 0.2 mg again over 1 min; may repeat at 1 min intervals; not to exceed 4 doses (1 mg)   Medications administered: We administered lidocaine, ropivacaine (PF) 2 mg/mL (0.2%), dexamethasone, and diazepam.  See the medical record for exact dosing, route, and time of administration.  Follow-up plan:   Return for Keep sch. appt.         Recent Visits Date Type Provider Dept  10/20/23 Office Visit Edward Jolly, MD [facility]  Showing recent visits within past 90 days and meeting  all other  requirements Today's Visits Date Type Provider Dept  11/16/23 Procedure visit Edward Jolly, MD [facility]  Showing today's visits and meeting all other requirements Future Appointments Date Type Provider Dept  12/02/23 Appointment Edward Jolly, MD [facility]  Showing future appointments within next 90 days and meeting all other requirements  Disposition: Discharge home  Discharge (Date  Time): 11/16/2023; 0931 hrs.   Primary Care Physician: Lorre Munroe, NP Location: [facility] Note by: Edward Jolly, MD (TTS technology used. I apologize for any typographical errors that were not detected and corrected.) Date: 11/16/2023; Time: 9:48 AM  Disclaimer:  Medicine is not an Visual merchandiser. The only guarantee in medicine is that nothing is guaranteed. It is important to note that the decision to proceed with this intervention was based on the information collected from the patient. The Data and conclusions were drawn from the patient's questionnaire, the interview, and the physical examination. Because the information was provided in large part by the patient, it cannot be guaranteed that it has not been purposely or unconsciously manipulated. Every effort has been made to obtain as much relevant data as possible for this evaluation. It is important to note that the conclusions that lead to this procedure are derived in large part from the available data. Always take into account that the treatment will also be dependent on availability of resources and existing treatment guidelines, considered by other Pain Management Practitioners as being common knowledge and practice, at the time of the intervention. For Medico-Legal purposes, it is also important to point out that variation in procedural techniques and pharmacological choices are the acceptable norm. The indications, contraindications, technique, and results of the above procedure should only be  interpreted and judged by a Board-Certified Interventional Pain Specialist with extensive familiarity and expertise in the same exact procedure and technique.

## 2023-11-16 NOTE — Progress Notes (Signed)
 Safety precautions to be maintained throughout the outpatient stay will include: orient to surroundings, keep bed in low position, maintain call bell within reach at all times, provide assistance with transfer out of bed and ambulation.

## 2023-11-16 NOTE — Patient Instructions (Signed)

## 2023-11-17 ENCOUNTER — Telehealth: Payer: Self-pay

## 2023-11-17 NOTE — Telephone Encounter (Signed)
 No issues post-procedure.

## 2023-11-20 ENCOUNTER — Other Ambulatory Visit: Payer: Self-pay | Admitting: Internal Medicine

## 2023-11-23 NOTE — Telephone Encounter (Signed)
 Requested Prescriptions  Pending Prescriptions Disp Refills   gabapentin (NEURONTIN) 300 MG capsule [Pharmacy Med Name: Gabapentin 300 MG Oral Capsule] 180 capsule 0    Sig: Take 1 capsule by mouth twice daily     Neurology: Anticonvulsants - gabapentin Passed - 11/23/2023  9:38 AM      Passed - Cr in normal range and within 360 days    Creat  Date Value Ref Range Status  06/01/2023 0.84 0.50 - 1.05 mg/dL Final   Creatinine, Urine  Date Value Ref Range Status  08/29/2022 35 20 - 275 mg/dL Final         Passed - Completed PHQ-2 or PHQ-9 in the last 360 days      Passed - Valid encounter within last 12 months    Recent Outpatient Visits           5 months ago Class 3 severe obesity due to excess calories with serious comorbidity and body mass index (BMI) of 40.0 to 44.9 in adult [facility])   [place] [facility] [place], [place], NP   1 year ago DM type 2 with diabetic peripheral neuropathy [facility])   [place] [facility] [place], [name], NP   1 year ago DM type 2 with diabetic peripheral neuropathy [facility])   [place] [facility] [place], [place], NP   2 years ago DM type 2 with diabetic peripheral neuropathy [facility])   [place] [facility] [place], [place] W, NP   2 years ago Chronic right hip pain   [place] [facility] [place], [name], NP       Future Appointments             In 4 days [place], [name], NP [place] [facility], [facility]

## 2023-11-27 ENCOUNTER — Encounter: Payer: Self-pay | Admitting: Internal Medicine

## 2023-11-27 ENCOUNTER — Ambulatory Visit (INDEPENDENT_AMBULATORY_CARE_PROVIDER_SITE_OTHER): Payer: Medicare Other | Admitting: Internal Medicine

## 2023-11-27 ENCOUNTER — Telehealth: Payer: Self-pay

## 2023-11-27 VITALS — BP 138/82 | Ht 62.0 in | Wt 226.0 lb

## 2023-11-27 DIAGNOSIS — E66813 Obesity, class 3: Secondary | ICD-10-CM

## 2023-11-27 DIAGNOSIS — M47816 Spondylosis without myelopathy or radiculopathy, lumbar region: Secondary | ICD-10-CM

## 2023-11-27 DIAGNOSIS — E119 Type 2 diabetes mellitus without complications: Secondary | ICD-10-CM

## 2023-11-27 DIAGNOSIS — I10 Essential (primary) hypertension: Secondary | ICD-10-CM | POA: Diagnosis not present

## 2023-11-27 DIAGNOSIS — E1142 Type 2 diabetes mellitus with diabetic polyneuropathy: Secondary | ICD-10-CM | POA: Diagnosis not present

## 2023-11-27 DIAGNOSIS — Z7984 Long term (current) use of oral hypoglycemic drugs: Secondary | ICD-10-CM

## 2023-11-27 DIAGNOSIS — I420 Dilated cardiomyopathy: Secondary | ICD-10-CM

## 2023-11-27 DIAGNOSIS — Z7985 Long-term (current) use of injectable non-insulin antidiabetic drugs: Secondary | ICD-10-CM

## 2023-11-27 DIAGNOSIS — G894 Chronic pain syndrome: Secondary | ICD-10-CM

## 2023-11-27 DIAGNOSIS — Z794 Long term (current) use of insulin: Secondary | ICD-10-CM

## 2023-11-27 DIAGNOSIS — M170 Bilateral primary osteoarthritis of knee: Secondary | ICD-10-CM | POA: Diagnosis not present

## 2023-11-27 DIAGNOSIS — E785 Hyperlipidemia, unspecified: Secondary | ICD-10-CM

## 2023-11-27 DIAGNOSIS — E1169 Type 2 diabetes mellitus with other specified complication: Secondary | ICD-10-CM

## 2023-11-27 DIAGNOSIS — D75839 Thrombocytosis, unspecified: Secondary | ICD-10-CM

## 2023-11-27 MED ORDER — KETOROLAC TROMETHAMINE 30 MG/ML IJ SOLN
30.0000 mg | Freq: Once | INTRAMUSCULAR | Status: AC
  Administered 2023-11-27: 30 mg via INTRAMUSCULAR

## 2023-11-27 NOTE — Assessment & Plan Note (Signed)
 Encouraged diet and exercise for weight loss ?

## 2023-11-27 NOTE — Assessment & Plan Note (Signed)
Encourage weight loss as this can produce joint pain Continue gabapentin and tizanidine for pain management

## 2023-11-27 NOTE — Progress Notes (Signed)
 Subjective:    Patient ID: Stacy Martinez, female    DOB: 1955-09-13, 68 y.o.   MRN: [id number]  HPI  Patient presents to clinic today for follow-up of chronic conditions.  HTN with cardiomyopathy: Her BP today is 138/82.  She is taking amlodipine, enalapril, carvedilol and furosemide as prescribed.  ECG from 11/2021 reviewed.  HLD: Her last LDL was 109, triglycerides 212, 05/2023.  She reports she is taking something but does not know the name of it.  Receives statin and ezetimibe are normal on her last.  She tries to consume a low-fat diet.  DM2 with peripheral neuropathy: Her last A1c was 7.6 %, 05/2023.  She is taking jardiance, [place], Greggory Keen and basaglar as prescribed.  Her sugars range 120-160.  She takes gabapentin for neuropathic pain.  Her last eye exam was < 1 year ago.  Flu 05/2023.  Prevnar 06/2021.  Pneumovax 06/2019.  COVID never.  OA/chronic pain: Mainly in her right shoulder, back, hips and knees.  She has been having worsening right hip pain.  She is taking gabapentin and tizanidine as prescribed with some relief of symptoms.  She follows with pain management.  Thrombocytosis: Her last platelet count was 478, 05/2023.  She does not follow with hematology.  Review of Systems  Past Medical History:  Diagnosis Date   Allergy    Arthritis    Diabetes mellitus without complication (HCC)    Enlarged heart    Hypertension     Current Outpatient Medications  Medication Sig Dispense Refill   acetaminophen (TYLENOL) 500 MG tablet Take 1-2 tablets (500-1,000 mg total) by mouth every 6 (six) hours as needed. 60 tablet 0   amLODipine (NORVASC) 10 MG tablet TAKE 1 TABLET BY MOUTH ONCE DAILY . APPOINTMENT REQUIRED FOR FUTURE REFILLS 30 tablet 3   benzonatate (TESSALON) 100 MG capsule Take 2 capsules (200 mg total) by mouth every 8 (eight) hours. 21 capsule 0   carvedilol (COREG) 25 MG tablet TAKE 1 TABLET BY MOUTH TWICE DAILY WITH MEALS . APPOINTMENT REQUIRED FOR FUTURE REFILLS 60  tablet 3   diclofenac (VOLTAREN) 50 MG EC tablet Take 1 tablet (50 mg total) by mouth 2 (two) times daily. 60 tablet 2   empagliflozin (JARDIANCE) 25 MG TABS tablet Take 1 tablet (25 mg total) by mouth daily. 30 tablet 3   enalapril (VASOTEC) 20 MG tablet Take 1 tablet by mouth twice daily 180 tablet 0   furosemide (LASIX) 40 MG tablet TAKE 1 TABLET BY MOUTH ONCE DAILY . APPOINTMENT REQUIRED FOR FUTURE REFILLS 30 tablet 3   gabapentin (NEURONTIN) 300 MG capsule Take 1 capsule by mouth twice daily 180 capsule 0   glucose blood (RELION GLUCOSE TEST STRIPS) test strip Use as instructed 100 each 12   Insulin Glargine (BASAGLAR KWIKPEN) 100 UNIT/ML INJECT 32 UNITS SUBCUTANEOUSLY ONCE DAILY . APPOINTMENT REQUIRED FOR FUTURE REFILLS 15 mL 2   ipratropium (ATROVENT) 0.06 % nasal spray Place 2 sprays into both nostrils 4 (four) times daily. 15 mL 12   ondansetron (ZOFRAN) 4 MG tablet Take 1 tablet (4 mg total) by mouth every 6 (six) hours as needed for nausea. 30 tablet 0   promethazine-dextromethorphan (PROMETHAZINE-DM) 6.25-15 MG/5ML syrup Take 5 mLs by mouth 4 (four) times daily as needed. 118 mL 0   ReliOn Ultra Thin Lancets MISC 1 each by Does not apply route daily. 100 each 11   sitaGLIPtin (JANUVIA) 100 MG tablet Take 1 tablet by mouth once daily 30 tablet  3   tirzepatide (MOUNJARO) 5 MG/0.5ML Pen Inject 5 mg into the skin once a week. 6 mL 0   tiZANidine (ZANAFLEX) 4 MG tablet Take 1 tablet (4 mg total) by mouth every 8 (eight) hours as needed for muscle spasms. 90 tablet 1   ULTICARE MINI PEN NEEDLES 32G X 6 MM MISC 1 Bottle by Does not apply route as directed. 100 each 1   No current facility-administered medications for this visit.    Allergies  Allergen Reactions   Prednisone Swelling   Metformin And Related     Severe nausea and diarrhea even with extended release versions    Family History  Problem Relation Age of Onset   Diabetes Mother    Cancer Father    Mental illness Son      Social History   Socioeconomic History   Marital status: Divorced    Spouse name: Not on file   Number of children: 4   Years of education: Not on file   Highest education level: Not on file  Occupational History   Not on file  Tobacco Use   Smoking status: Never   Smokeless tobacco: Never  Vaping Use   Vaping status: Never Used  Substance and Sexual Activity   Alcohol use: No    Alcohol/week: 0.0 standard drinks of alcohol   Drug use: No   Sexual activity: Not on file  Other Topics Concern   Not on file  Social History Narrative   Not on file   Social Drivers of Health   Financial Resource Strain: Not on file  Food Insecurity: Not on file  Transportation Needs: Not on file  Physical Activity: Not on file  Stress: Not on file  Social Connections: Not on file  Intimate Partner Violence: Not on file     Constitutional: Denies fever, malaise, fatigue, headache or abrupt weight changes.  HEENT: Denies eye pain, eye redness, ear pain, ringing in the ears, wax buildup, runny nose, nasal congestion, bloody nose, or sore throat. Respiratory: Denies difficulty breathing, shortness of breath, cough or sputum production.   Cardiovascular: Patient reports swelling in legs.  Denies chest pain, chest tightness, palpitations or swelling in the hands.  Gastrointestinal: Denies abdominal pain, bloating, constipation, diarrhea or blood in the stool.  GU: Denies urgency, frequency, pain with urination, burning sensation, blood in urine, odor or discharge. Musculoskeletal: Denies decrease in range of motion, difficulty with gait, muscle pain or joint pain and swelling.  Skin: Denies redness, rashes, lesions or ulcercations.  Neurological: Patient reports neuropathic pain.  Denies dizziness, difficulty with memory, difficulty with speech or problems with balance and coordination.  Psych: Denies anxiety, depression, SI/HI.  No other specific complaints in a complete review of systems  (except as listed in HPI above).     Objective:   Physical Exam  BP 138/82 (BP Location: Left Arm, Patient Position: Sitting, Cuff Size: Large)   Ht 5\' 2"  (1.575 m)   Wt 226 lb (102.5 kg)   BMI 41.34 kg/m    Wt Readings from Last 3 Encounters:  11/16/23 220 lb (99.8 kg)  10/20/23 220 lb (99.8 kg)  08/28/23 220 lb (99.8 kg)    General: Appears her stated age, obese, in NAD. Skin: Warm, dry and intact. No ulcerations noted. HEENT: Head: normal shape and size; Eyes: sclera white, no icterus, conjunctiva pink, PERRLA and EOMs intact;  Cardiovascular: Normal rate and rhythm. S1,S2 noted.  No murmur, rubs or gallops noted. No JVD or BLE  edema. No carotid bruits noted. Pulmonary/Chest: Normal effort and positive vesicular breath sounds. No respiratory distress. No wheezes, rales or ronchi noted.  Musculoskeletal: Gait slow and steady without device. Neurological: Alert and oriented. Coordination normal.  Psychiatric: Mood and affect normal. Behavior is normal. Judgment and thought content normal.    BMET    Component Value Date/Time   NA 141 06/01/2023 1512   NA 136 11/30/2015 1037   K 5.1 06/01/2023 1512   CL 105 06/01/2023 1512   CO2 24 06/01/2023 1512   GLUCOSE 113 06/01/2023 1512   BUN 16 06/01/2023 1512   BUN 19 11/30/2015 1037   CREATININE 0.84 06/01/2023 1512   CALCIUM 9.6 06/01/2023 1512   GFRNONAA >60 11/30/2021 0615   GFRNONAA 88 03/22/2019 0909   GFRAA 102 03/22/2019 0909    Lipid Panel     Component Value Date/Time   CHOL 196 06/01/2023 1512   CHOL 182 11/30/2015 1037   TRIG 212 (H) 06/01/2023 1512   HDL 54 06/01/2023 1512   HDL 47 11/30/2015 1037   CHOLHDL 3.6 06/01/2023 1512   VLDL 33 (H) 04/08/2017 0857   LDLCALC 109 (H) 06/01/2023 1512    CBC    Component Value Date/Time   WBC 11.0 (H) 06/01/2023 1512   RBC 5.07 06/01/2023 1512   HGB 12.9 06/01/2023 1512   HGB 12.2 11/30/2015 1037   HCT 40.0 06/01/2023 1512   HCT 38.0 11/30/2015 1037   PLT  478 (H) 06/01/2023 1512   PLT 428 (H) 11/30/2015 1037   MCV 78.9 (L) 06/01/2023 1512   MCV 83 11/30/2015 1037   MCH 25.4 (L) 06/01/2023 1512   MCHC 32.3 06/01/2023 1512   RDW 14.5 06/01/2023 1512   RDW 15.1 11/30/2015 1037   LYMPHSABS 2,278 08/29/2022 1012   LYMPHSABS 3.1 11/30/2015 1037   MONOABS 0.7 11/15/2021 1110   EOSABS 179 08/29/2022 1012   EOSABS 0.4 11/30/2015 1037   BASOSABS 77 08/29/2022 1012   BASOSABS 0.1 11/30/2015 1037    Hgb A1C Lab Results  Component Value Date   HGBA1C 7.6 (A) 06/01/2023            Assessment & Plan:     RTC in 6 months, follow-up chronic conditions Nicki Reaper, NP

## 2023-11-27 NOTE — Assessment & Plan Note (Signed)
Continue gabapentin and tizanidine per pain management

## 2023-11-27 NOTE — Telephone Encounter (Signed)
 Copied from CRM [id number]. Topic: General - Other >> Nov 27, 2023  2:25 PM Geroge Baseman wrote: Reason for CRM:  Please resend appointment notes to [facility]. They received the fax that was sent but it was missing appointment dates needed. Callback [phone]

## 2023-11-27 NOTE — Telephone Encounter (Signed)
Faxed again to provided number  

## 2023-11-27 NOTE — Assessment & Plan Note (Signed)
Continue amlodipine, enalapril, carvedilol and furosemide as prescribed Reinforced DASH diet and exercise for weight loss C-Met today

## 2023-11-27 NOTE — Patient Instructions (Signed)

## 2023-11-27 NOTE — Assessment & Plan Note (Signed)
 A1c and urine microalbumin today Continue januvia, jardiance, mounjaro and basaglar Encourage low-carb diet and exercise for weight loss Encouraged eye exam Encouraged routine foot exam Pneumonia vaccinations UTD

## 2023-11-27 NOTE — Addendum Note (Signed)
 Addended by: Luanna Cole D on: 11/27/2023 03:17 PM   Modules accepted: Orders

## 2023-11-27 NOTE — Assessment & Plan Note (Signed)
 C-Met and lipid profile today Encouraged her to consume a low-fat diet Advised her to call me and let me know what cholesterol medicine she is taking

## 2023-11-27 NOTE — Assessment & Plan Note (Signed)
 CBC today.

## 2023-11-28 ENCOUNTER — Encounter: Payer: Self-pay | Admitting: Internal Medicine

## 2023-11-28 ENCOUNTER — Other Ambulatory Visit: Payer: Self-pay | Admitting: Internal Medicine

## 2023-11-28 DIAGNOSIS — I10 Essential (primary) hypertension: Secondary | ICD-10-CM

## 2023-11-28 DIAGNOSIS — E1142 Type 2 diabetes mellitus with diabetic polyneuropathy: Secondary | ICD-10-CM

## 2023-11-28 LAB — COMPREHENSIVE METABOLIC PANEL WITH GFR
AG Ratio: 1.4 (calc) (ref 1.0–2.5)
ALT: 8 U/L (ref 6–29)
AST: 11 U/L (ref 10–35)
Albumin: 4.2 g/dL (ref 3.6–5.1)
Alkaline phosphatase (APISO): 91 U/L (ref 37–153)
BUN: 19 mg/dL (ref 7–25)
CO2: 25 mmol/L (ref 20–32)
Calcium: 9.1 mg/dL (ref 8.6–10.4)
Chloride: 103 mmol/L (ref 98–110)
Creat: 0.67 mg/dL (ref 0.50–1.05)
Globulin: 3.1 g/dL (ref 1.9–3.7)
Glucose, Bld: 86 mg/dL (ref 65–139)
Potassium: 4.3 mmol/L (ref 3.5–5.3)
Sodium: 138 mmol/L (ref 135–146)
Total Bilirubin: 0.4 mg/dL (ref 0.2–1.2)
Total Protein: 7.3 g/dL (ref 6.1–8.1)
eGFR: 96 mL/min/{1.73_m2} (ref >=60)

## 2023-11-28 LAB — CBC
HCT: 39.1 % (ref 35.0–45.0)
Hemoglobin: 12.9 g/dL (ref 11.7–15.5)
MCH: 26.1 pg — ABNORMAL LOW (ref 27.0–33.0)
MCHC: 33 g/dL (ref 32.0–36.0)
MCV: 79 fL — ABNORMAL LOW (ref 80.0–100.0)
MPV: 10.2 fL (ref 7.5–12.5)
Platelets: 472 10*3/uL — ABNORMAL HIGH (ref 140–400)
RBC: 4.95 10*6/uL (ref 3.80–5.10)
RDW: 15.1 % — ABNORMAL HIGH (ref 11.0–15.0)
WBC: 16.9 10*3/uL — ABNORMAL HIGH (ref 3.8–10.8)

## 2023-11-28 LAB — HEMOGLOBIN A1C
Hgb A1c MFr Bld: 6.4 %{Hb} — ABNORMAL HIGH (ref <5.7)
Mean Plasma Glucose: 137 mg/dL
eAG (mmol/L): 7.6 mmol/L

## 2023-11-28 LAB — MICROALBUMIN / CREATININE URINE RATIO
Creatinine, Urine: 85 mg/dL (ref 20–275)
Microalb Creat Ratio: 7 mg/g{creat} (ref <30)
Microalb, Ur: 0.6 mg/dL

## 2023-11-28 LAB — LIPID PANEL
Cholesterol: 207 mg/dL — ABNORMAL HIGH (ref <200)
HDL: 55 mg/dL (ref >=50)
LDL Cholesterol (Calc): 125 mg/dL — ABNORMAL HIGH
Non-HDL Cholesterol (Calc): 152 mg/dL — ABNORMAL HIGH (ref <130)
Total CHOL/HDL Ratio: 3.8 (calc) (ref <5.0)
Triglycerides: 158 mg/dL — ABNORMAL HIGH (ref <150)

## 2023-11-29 ENCOUNTER — Other Ambulatory Visit: Payer: Self-pay | Admitting: Internal Medicine

## 2023-11-29 DIAGNOSIS — I10 Essential (primary) hypertension: Secondary | ICD-10-CM

## 2023-11-30 ENCOUNTER — Encounter: Payer: Self-pay | Admitting: Internal Medicine

## 2023-11-30 MED ORDER — ENALAPRIL MALEATE 20 MG PO TABS: 20.0000 mg | ORAL_TABLET | Freq: Two times a day (BID) | ORAL | 1 refills | Status: DC

## 2023-12-01 ENCOUNTER — Encounter: Payer: Self-pay | Admitting: Internal Medicine

## 2023-12-01 ENCOUNTER — Telehealth: Payer: Self-pay

## 2023-12-01 ENCOUNTER — Other Ambulatory Visit (HOSPITAL_COMMUNITY)
Admission: RE | Admit: 2023-12-01 | Discharge: 2023-12-01 | Disposition: A | Discharge status: Home / Self Care | Source: Ambulatory Visit | Attending: Internal Medicine | Admitting: Internal Medicine

## 2023-12-01 ENCOUNTER — Ambulatory Visit (INDEPENDENT_AMBULATORY_CARE_PROVIDER_SITE_OTHER): Admitting: Internal Medicine

## 2023-12-01 VITALS — BP 140/78 | Ht 62.0 in | Wt 226.0 lb

## 2023-12-01 DIAGNOSIS — R3911 Hesitancy of micturition: Secondary | ICD-10-CM

## 2023-12-01 DIAGNOSIS — N898 Other specified noninflammatory disorders of vagina: Secondary | ICD-10-CM

## 2023-12-01 DIAGNOSIS — R3915 Urgency of urination: Secondary | ICD-10-CM | POA: Diagnosis not present

## 2023-12-01 DIAGNOSIS — I10 Essential (primary) hypertension: Secondary | ICD-10-CM

## 2023-12-01 DIAGNOSIS — E1169 Type 2 diabetes mellitus with other specified complication: Secondary | ICD-10-CM

## 2023-12-01 DIAGNOSIS — E785 Hyperlipidemia, unspecified: Secondary | ICD-10-CM

## 2023-12-01 DIAGNOSIS — Z794 Long term (current) use of insulin: Secondary | ICD-10-CM

## 2023-12-01 LAB — POCT URINE DIPSTICK
Bilirubin, UA: NEGATIVE
Blood, UA: NEGATIVE
Glucose, UA: >=1000 mg/dL — AB
Ketones, POC UA: NEGATIVE mg/dL
Leukocytes, UA: NEGATIVE
Nitrite, UA: NEGATIVE
POC PROTEIN,UA: NEGATIVE
Spec Grav, UA: 1.015 (ref 1.010–1.025)
Urobilinogen, UA: 0.2 U/dL
pH, UA: 6 (ref 5.0–8.0)

## 2023-12-01 MED ORDER — FENOFIBRATE 54 MG PO TABS
54.0000 mg | ORAL_TABLET | Freq: Every day | ORAL | 1 refills | Status: DC
Start: 2023-12-01 — End: 2024-06-21

## 2023-12-01 MED ORDER — FLUCONAZOLE 150 MG PO TABS: 150.0000 mg | ORAL_TABLET | Freq: Once | ORAL | 0 refills | Status: AC

## 2023-12-01 NOTE — Telephone Encounter (Signed)
 Requested medication (s) are due for refill today: yes   Requested medication (s) are on the active medication list: yes   Last refill:  07/24/23 #30 3 refills- lasix, norvasc, 07/24/23 #60 3 refills - coreg  Future visit scheduled: today and on 05/30/24  Notes to clinic:   do you want to refill Rxs and give refills until 05/30/24?     Requested Prescriptions  Pending Prescriptions Disp Refills   furosemide (LASIX) 40 MG tablet [Pharmacy Med Name: Furosemide 40 MG Oral Tablet] 30 tablet 0    Sig: TAKE 1 TABLET BY MOUTH ONCE DAILY (APPOINTMENT  REQUIRED  FOR  FUTURE  REFILLS)     Cardiovascular:  Diuretics - Loop Failed - 12/01/2023  1:27 PM      Failed - Mg Level in normal range and within 180 days    No results found for: "MG"       Failed - Last BP in normal range    BP Readings from Last 1 Encounters:  12/01/23 (!) 140/78         Passed - K in normal range and within 180 days    Potassium  Date Value Ref Range Status  11/27/2023 4.3 3.5 - 5.3 mmol/L Final         Passed - Ca in normal range and within 180 days    Calcium  Date Value Ref Range Status  11/27/2023 9.1 8.6 - 10.4 mg/dL Final         Passed - Na in normal range and within 180 days    Sodium  Date Value Ref Range Status  11/27/2023 138 135 - 146 mmol/L Final  11/30/2015 136 134 - 144 mmol/L Final         Passed - Cr in normal range and within 180 days    Creat  Date Value Ref Range Status  11/27/2023 0.67 0.50 - 1.05 mg/dL Final   Creatinine, Urine  Date Value Ref Range Status  11/27/2023 85 20 - 275 mg/dL Final         Passed - Cl in normal range and within 180 days    Chloride  Date Value Ref Range Status  11/27/2023 103 98 - 110 mmol/L Final         Passed - Valid encounter within last 6 months    Recent Outpatient Visits           Today Urinary urgency   [place] [facility] [place], [name], NP   4 days ago DM type 2 with diabetic peripheral neuropathy [facility])   [facility] [place], [place], NP               carvedilol (COREG) 25 MG tablet [Pharmacy Med Name: Carvedilol 25 MG Oral Tablet] 60 tablet 0    Sig: TAKE 1 TABLET BY MOUTH TWICE DAILY WITH MEALS (APPOINTMENT  REQUIRED  FOR  FUTURE  REFILLS)     Cardiovascular: Beta Blockers 3 Failed - 12/01/2023  1:27 PM      Failed - Last BP in normal range    BP Readings from Last 1 Encounters:  12/01/23 (!) 140/78         Passed - Cr in normal range and within 360 days    Creat  Date Value Ref Range Status  11/27/2023 0.67 0.50 - 1.05 mg/dL Final   Creatinine, Urine  Date Value Ref Range Status  11/27/2023 85 20 - 275 mg/dL  Final         Passed - AST in normal range and within 360 days    AST  Date Value Ref Range Status  11/27/2023 11 10 - 35 U/L Final         Passed - ALT in normal range and within 360 days    ALT  Date Value Ref Range Status  11/27/2023 8 6 - 29 U/L Final         Passed - Last Heart Rate in normal range    Pulse Readings from Last 1 Encounters:  10/20/23 77         Passed - Valid encounter within last 6 months    Recent Outpatient Visits           Today Urinary urgency   [place] [facility] [place], [place], NP   4 days ago DM type 2 with diabetic peripheral neuropathy [facility])   [place] [facility] [place], [place], NP               amLODipine (NORVASC) 10 MG tablet [Pharmacy Med Name: amLODIPine Besylate 10 MG Oral Tablet] 30 tablet 0    Sig: TAKE 1 TABLET BY MOUTH ONCE DAILY (APPOINTMENT  REQUIRED  FOR  FUTURE  REFILLS)     Cardiovascular: Calcium Channel Blockers 2 Failed - 12/01/2023  1:27 PM      Failed - Last BP in normal range    BP Readings from Last 1 Encounters:  12/01/23 (!) 140/78         Passed - Last Heart Rate in normal range    Pulse Readings from Last 1 Encounters:  10/20/23 77         Passed - Valid encounter within last 6 months    Recent Outpatient Visits            Today Urinary urgency   [place] [facility] [place], [place], NP   4 days ago DM type 2 with diabetic peripheral neuropathy [facility])   [place] [facility] [place], [name], [place]

## 2023-12-01 NOTE — Progress Notes (Signed)
 Subjective:    Patient ID: Stacy Martinez, female    DOB: 1956-02-01, 68 y.o.   MRN: [id number]  HPI  Discussed the use of AI scribe software for clinical note transcription with the patient, who gave verbal consent to proceed.   Stacy Martinez is a 68 year old female who presents with symptoms suggestive of a urinary tract infection.  She has been experiencing difficulty urinating and a persistent sensation of needing to urinate for approximately six to seven days. The sensation is described as uncomfortable, but there is no burning or hematuria. She is concerned about these symptoms due to an upcoming spinal procedure and wants to ensure there is no infection present.  She reports vaginal itching and an unusual odor. There is no abdominal or pelvic pain associated with these symptoms.  She has not had any abnormal vaginal bleeding.  Her BP today is 144/80.  She is currently taking amlodipine, carvedilol, enalapril, and lasix for hypertension.  She reports she recently restarted enalapril back again about 1 week ago.   Given her last lipid panel which was elevated with LDL of 125 and triglycerides of 128, she recently restarted fenofibrate and has requested a refill as she has only ten pills left.  She reports she is unable to take statins due to myalgias.      Review of Systems   Past Medical History:  Diagnosis Date   Allergy    Arthritis    Diabetes mellitus without complication (HCC)    Enlarged heart    Hypertension     Current Outpatient Medications  Medication Sig Dispense Refill   acetaminophen (TYLENOL) 500 MG tablet Take 1-2 tablets (500-1,000 mg total) by mouth every 6 (six) hours as needed. (Patient not taking: Reported on 11/27/2023) 60 tablet 0   amLODipine (NORVASC) 10 MG tablet TAKE 1 TABLET BY MOUTH ONCE DAILY . APPOINTMENT REQUIRED FOR FUTURE REFILLS 30 tablet 3   carvedilol (COREG) 25 MG tablet TAKE 1 TABLET BY MOUTH TWICE DAILY WITH MEALS . APPOINTMENT REQUIRED  FOR FUTURE REFILLS 60 tablet 3   diclofenac (VOLTAREN) 50 MG EC tablet Take 1 tablet (50 mg total) by mouth 2 (two) times daily. (Patient not taking: Reported on 11/27/2023) 60 tablet 2   empagliflozin (JARDIANCE) 25 MG TABS tablet Take 1 tablet (25 mg total) by mouth daily. 30 tablet 3   enalapril (VASOTEC) 20 MG tablet Take 1 tablet (20 mg total) by mouth 2 (two) times daily. 180 tablet 1   furosemide (LASIX) 40 MG tablet TAKE 1 TABLET BY MOUTH ONCE DAILY . APPOINTMENT REQUIRED FOR FUTURE REFILLS 30 tablet 3   gabapentin (NEURONTIN) 300 MG capsule Take 1 capsule by mouth twice daily 180 capsule 0   glucose blood (RELION GLUCOSE TEST STRIPS) test strip Use as instructed 100 each 12   Insulin Glargine (BASAGLAR KWIKPEN) 100 UNIT/ML INJECT 32 UNITS SUBCUTANEOUSLY ONCE DAILY . APPOINTMENT REQUIRED FOR FUTURE REFILLS 15 mL 2   ipratropium (ATROVENT) 0.06 % nasal spray Place 2 sprays into both nostrils 4 (four) times daily. (Patient not taking: Reported on 11/27/2023) 15 mL 12   ondansetron (ZOFRAN) 4 MG tablet Take 1 tablet (4 mg total) by mouth every 6 (six) hours as needed for nausea. 30 tablet 0   ReliOn Ultra Thin Lancets MISC 1 each by Does not apply route daily. 100 each 11   sitaGLIPtin (JANUVIA) 100 MG tablet Take 1 tablet by mouth once daily 30 tablet 3   tirzepatide [facility])  5 MG/0.5ML Pen Inject 5 mg into the skin once a week. 6 mL 0   tiZANidine (ZANAFLEX) 4 MG tablet Take 1 tablet (4 mg total) by mouth every 8 (eight) hours as needed for muscle spasms. 90 tablet 1   ULTICARE MINI PEN NEEDLES 32G X 6 MM MISC 1 Bottle by Does not apply route as directed. 100 each 1   No current facility-administered medications for this visit.    Allergies  Allergen Reactions   Prednisone Swelling   Metformin And Related     Severe nausea and diarrhea even with extended release versions    Family History  Problem Relation Age of Onset   Diabetes Mother    Cancer Father    Mental illness Son      Social History   Socioeconomic History   Marital status: Divorced    Spouse name: Not on file   Number of children: 4   Years of education: Not on file   Highest education level: Not on file  Occupational History   Not on file  Tobacco Use   Smoking status: Never   Smokeless tobacco: Never  Vaping Use   Vaping status: Never Used  Substance and Sexual Activity   Alcohol use: No    Alcohol/week: 0.0 standard drinks of alcohol   Drug use: No   Sexual activity: Not on file  Other Topics Concern   Not on file  Social History Narrative   Not on file   Social Drivers of Health   Financial Resource Strain: Not on file  Food Insecurity: Not on file  Transportation Needs: Not on file  Physical Activity: Not on file  Stress: Not on file  Social Connections: Not on file  Intimate Partner Violence: Not on file     Constitutional: Denies fever, malaise, fatigue, headache or abrupt weight changes.  Respiratory: Denies difficulty breathing, shortness of breath, cough or sputum production.   Cardiovascular: Denies chest pain, chest tightness, palpitations or swelling in the hands or feet.  Gastrointestinal: Denies abdominal pain, bloating, constipation, diarrhea or blood in the stool.  GU: Patient reports urinary urgency, hesitancy, vaginal itching and odor.  Denies frequency, pain with urination, burning sensation, blood in urine, or discharge. Musculoskeletal: Denies decrease in range of motion, difficulty with gait, muscle pain or joint pain and swelling.  Skin: Denies redness, rashes, lesions or ulcercations.   No other specific complaints in a complete review of systems (except as listed in HPI above).      Objective:   Physical Exam BP (!) 144/80 (BP Location: Left Arm, Patient Position: Sitting, Cuff Size: Large)   Ht 5\' 2"  (1.575 m)   Wt 226 lb (102.5 kg)   BMI 41.34 kg/m   Wt Readings from Last 3 Encounters:  11/27/23 226 lb (102.5 kg)  11/16/23 220 lb (99.8  kg)  10/20/23 220 lb (99.8 kg)    General: Appears her stated age, obese, in NAD. Cardiovascular: Normal rate and rhythm. S1,S2 noted.  No murmur, rubs or gallops noted.  Pulmonary/Chest: Normal effort and positive vesicular breath sounds. No respiratory distress. No wheezes, rales or ronchi noted.  Abdomen: Soft and nontender.  Pelvic: Self swab. Musculoskeletal:  No difficulty with gait.  Neurological: Alert and oriented. Coordination normal.    BMET    Component Value Date/Time   NA 138 11/27/2023 1457   NA 136 11/30/2015 1037   K 4.3 11/27/2023 1457   CL 103 11/27/2023 1457   CO2 25 11/27/2023 1457  GLUCOSE 86 11/27/2023 1457   BUN 19 11/27/2023 1457   BUN 19 11/30/2015 1037   CREATININE 0.67 11/27/2023 1457   CALCIUM 9.1 11/27/2023 1457   GFRNONAA >60 11/30/2021 0615   GFRNONAA 88 03/22/2019 0909   GFRAA 102 03/22/2019 0909    Lipid Panel     Component Value Date/Time   CHOL 207 (H) 11/27/2023 1457   CHOL 182 11/30/2015 1037   TRIG 158 (H) 11/27/2023 1457   HDL 55 11/27/2023 1457   HDL 47 11/30/2015 1037   CHOLHDL 3.8 11/27/2023 1457   VLDL 33 (H) 04/08/2017 0857   LDLCALC 125 (H) 11/27/2023 1457    CBC    Component Value Date/Time   WBC 16.9 (H) 11/27/2023 1457   RBC 4.95 11/27/2023 1457   HGB 12.9 11/27/2023 1457   HGB 12.2 11/30/2015 1037   HCT 39.1 11/27/2023 1457   HCT 38.0 11/30/2015 1037   PLT 472 (H) 11/27/2023 1457   PLT 428 (H) 11/30/2015 1037   MCV 79.0 (L) 11/27/2023 1457   MCV 83 11/30/2015 1037   MCH 26.1 (L) 11/27/2023 1457   MCHC 33.0 11/27/2023 1457   RDW 15.1 (H) 11/27/2023 1457   RDW 15.1 11/30/2015 1037   LYMPHSABS 2,278 08/29/2022 1012   LYMPHSABS 3.1 11/30/2015 1037   MONOABS 0.7 11/15/2021 1110   EOSABS 179 08/29/2022 1012   EOSABS 0.4 11/30/2015 1037   BASOSABS 77 08/29/2022 1012   BASOSABS 0.1 11/30/2015 1037    Hgb A1C Lab Results  Component Value Date   HGBA1C 6.4 (H) 11/27/2023            Assessment &  Plan:  Assessment and Plan    Urinary Hesitancy, Urinary Urgency, vaginal Itching and Odor Symptoms of vaginal itching and unusual odor suggest possible yeast infection or bacterial vaginosis. Symptoms have persisted since last Tuesday or Wednesday. Urinalysis was normal except for glucose presence, not indicative of a urinary tract infection.  - Perform wet prep to check for bacterial vaginosis and yeast infection - Prescribe a single dose of fluconazole 150 mg p.o. x 1  Hypertension Blood pressure remains elevated, with readings in the 140s to 150s. She is currently on amlodipine, carvedilol, enalapril, and Lasix, with enalapril recently restarted. Plan to monitor blood pressure and consider medication adjustment if it remains elevated at the next visit. Target blood pressure is less than 130/80 mmHg. - Monitor blood pressure at the next visit - Consider adjusting hypertension medication if blood pressure remains elevated  Hyperlipidemia She is currently on fenofibrate for hyperlipidemia with no adverse effects reported. Requires a refill as she has about ten pills left. - Refill fenofibrate prescription   RTC in 5 months for follow-up of chronic conditions Nicki Reaper, NP

## 2023-12-01 NOTE — Telephone Encounter (Signed)
 Copied from CRM [id number]. Topic: General - Other >> Dec 01, 2023  1:53 PM Fredrica W wrote: Ovarie called from [facility]. They received request but was missing information. Per caller documentation needs to include treatment dates of service. Please update and fax back to [phone]. Thank You

## 2023-12-01 NOTE — Patient Instructions (Signed)
 Irritation of the Vagina (Vaginitis): What to Know  Vaginitis is irritation and swelling of the vagina. It happens when the usual balance of bacteria and yeast in the vagina changes. This change causes some types to grow too much. This overgrowth leads to vaginitis. What are the causes? Bacteria. Yeast, which is a fungus. A parasite. A virus. Low hormones in the body. This can occur during pregnancy, breastfeeding, or after menopause. What increases the risk? Irritants, such as douches, bubble baths, scented tampons, and feminine sprays. Antibiotics. Poor hygiene. Wearing tight pants or thong underwear. Some birth control methods, such as diaphragms, vaginal sponges, or spermicides. Having sex without a condom or having sex with more than one person. Infections. Uncontrolled diabetes. What are the signs or symptoms? Abnormal fluid from the vagina. The fluid may be: White, gray, or yellow. Thick, white, and cheesy. Frothy and yellow or green. A bad smell from the vagina. Itching, pain, or swelling in the vagina. Pain during sex. Pain or burning when you pee. How is this diagnosed? This condition is diagnosed based on your symptoms, medical history, and an exam. This may include a pelvic exam. Tests may also be done. Tests may be done to: Check the pH level of your vagina. Check the fluid in your vagina. How is this treated? Treatment will depend on what is causing your vaginitis. Treatment may include: Antibiotics. Antifungal medicines. Medicines to treat symptoms if you have a virus. Your sex partner should also be treated. Estrogen medicines. Medicines to treat allergies. The medicines may be pills or creams. Follow these instructions at home: Lifestyle Keep the area around your vagina clean and dry. Avoid using soap. Rinse the area with water. Until your health care provider says it's okay: Do not douche. Do not use tampons. Use pads, if needed. Do not have sex. Wipe  from front to back after going to the bathroom. When the provider says it's okay, practice safe sex. Use condoms. General instructions Take your medicines only as told. If you were given antibiotics, take them as told. Do not stop taking them even if you start to feel better. How is this prevented? Use mild, unscented products. Avoid the following products if they are scented: Sprays. Detergents. Tampons. Products for cleaning the vagina. Soaps or bubble baths. Let air reach your genital area. To do this: Wear cotton underwear. Do not wear underwear while you sleep. Do not wear tight pants and underwear or pantyhose without a cotton panel. Do not wear thong underwear. Take off any wet clothing, such as bathing suits, as soon as possible. Practice safe sex. Use condoms. Contact a health care provider if: You have pain in the belly or around the pelvis. You have a fever or chills. You have symptoms that last for more than 2-3 days. This information is not intended to replace advice given to you by your health care provider. Make sure you discuss any questions you have with your health care provider. Document Revised: 05/21/2023 Document Reviewed: 12/29/2022 [employer]vier Patient Education  2024 [employer].

## 2023-12-02 ENCOUNTER — Ambulatory Visit
Payer: Medicare Other | Attending: Student in an Organized Health Care Education/Training Program | Admitting: Student in an Organized Health Care Education/Training Program

## 2023-12-02 ENCOUNTER — Telehealth: Payer: Self-pay

## 2023-12-02 ENCOUNTER — Encounter: Payer: Self-pay | Admitting: Student in an Organized Health Care Education/Training Program

## 2023-12-02 ENCOUNTER — Ambulatory Visit
Admission: RE | Admit: 2023-12-02 | Discharge: 2023-12-02 | Disposition: A | Discharge status: Home / Self Care | Source: Ambulatory Visit | Attending: Student in an Organized Health Care Education/Training Program | Admitting: Student in an Organized Health Care Education/Training Program

## 2023-12-02 DIAGNOSIS — M47816 Spondylosis without myelopathy or radiculopathy, lumbar region: Secondary | ICD-10-CM | POA: Diagnosis not present

## 2023-12-02 LAB — CERVICOVAGINAL ANCILLARY ONLY
Bacterial Vaginitis (gardnerella): NEGATIVE
Candida Glabrata: POSITIVE — AB
Candida Vaginitis: POSITIVE — AB
Comment: NEGATIVE
Comment: NEGATIVE
Comment: NEGATIVE

## 2023-12-02 MED ORDER — IOPAMIDOL (ISOVUE-M 200) INJECTION 41%: 10.0000 mL | Freq: Once | INTRAMUSCULAR | Status: DC

## 2023-12-02 MED ORDER — LACTATED RINGERS IV SOLN
Freq: Once | INTRAVENOUS | Status: AC
Start: 2023-12-02 — End: 2023-12-02

## 2023-12-02 MED ORDER — ROPIVACAINE HCL 2 MG/ML IJ SOLN
INTRAMUSCULAR | Status: AC
  Filled 2023-12-02: qty 20

## 2023-12-02 MED ORDER — LIDOCAINE HCL 2 % IJ SOLN
20.0000 mL | Freq: Once | INTRAMUSCULAR | Status: AC
  Administered 2023-12-02: 400 mg

## 2023-12-02 MED ORDER — IOHEXOL 180 MG/ML  SOLN
INTRAMUSCULAR | Status: AC
  Filled 2023-12-02: qty 20

## 2023-12-02 MED ORDER — ROPIVACAINE HCL 2 MG/ML IJ SOLN
18.0000 mL | Freq: Once | INTRAMUSCULAR | Status: AC
  Administered 2023-12-02: 18 mL via PERINEURAL

## 2023-12-02 MED ORDER — DEXAMETHASONE SODIUM PHOSPHATE 10 MG/ML IJ SOLN
20.0000 mg | Freq: Once | INTRAMUSCULAR | Status: AC
  Administered 2023-12-02: 20 mg

## 2023-12-02 MED ORDER — LIDOCAINE HCL 2 % IJ SOLN
INTRAMUSCULAR | Status: AC
  Filled 2023-12-02: qty 20

## 2023-12-02 MED ORDER — DEXAMETHASONE SODIUM PHOSPHATE 10 MG/ML IJ SOLN
INTRAMUSCULAR | Status: AC
Start: 2023-12-02 — End: ?
  Filled 2023-12-02: qty 2

## 2023-12-02 MED ORDER — MIDAZOLAM HCL 2 MG/2ML IJ SOLN
INTRAMUSCULAR | Status: AC
Start: 2023-12-02 — End: ?
  Filled 2023-12-02: qty 2

## 2023-12-02 MED ORDER — MIDAZOLAM HCL 2 MG/2ML IJ SOLN
0.5000 mg | Freq: Once | INTRAMUSCULAR | Status: AC
Start: 2023-12-02 — End: 2023-12-02
  Administered 2023-12-02: 2 mg via INTRAVENOUS

## 2023-12-02 NOTE — Patient Instructions (Signed)

## 2023-12-02 NOTE — Telephone Encounter (Signed)
 Stacy Martinez (Stacy Martinez) Rx #: [id number] Need Help? Call us at [phone] Status Shared Drug Januvia 100MG  tablets Thumbnail image of the form Form [facility] Call-In Form Phone: [phone] Original Claim Info [phone]

## 2023-12-02 NOTE — Progress Notes (Signed)
 Safety precautions to be maintained throughout the outpatient stay will include: orient to surroundings, keep bed in low position, maintain call bell within reach at all times, provide assistance with transfer out of bed and ambulation.    DR Cherylann Ratel notified of BP

## 2023-12-02 NOTE — Progress Notes (Signed)
 PROVIDER NOTE: Information contained herein reflects review and annotations entered in association with encounter. Interpretation of such information and data should be left to medically-trained personnel. Information provided to patient can be located elsewhere in the medical record under "Patient Instructions". Document created using STT-dictation technology, any transcriptional errors that may result from process are unintentional.    Patient: Stacy Martinez  Service Category: Procedure  Provider: Edward Jolly, MD  DOB: Nov 08, 1955  DOS: 12/02/2023  Location: [facility]  MRN: [id number]  Setting: Ambulatory - outpatient  Referring Provider: Edward Jolly, MD  Type: Established Patient  Specialty: Interventional Pain Management  PCP: Lorre Munroe, NP   Primary Reason for Visit: Interventional Pain Management Treatment. CC: Back Pain (lower)   Procedure:          Anesthesia, Analgesia, Anxiolysis:  Type: Thermal Lumbar Facet, Medial Branch Radiofrequency Ablation/Neurotomy           Primary Purpose: Therapeutic Region: Posterolateral Lumbosacral Spine Level: L3, L4, L5, Medial Branch Level(s). These levels will denervate the L3-4, L4-5, lumbar facet joints. Laterality:  Bilateral  Type:  minimal sedation  IV Versed Indication(s): Anxiety Route: Intravenous (IV) IV Access: Secured Sedation: Meaningful verbal contact was maintained at all times during the procedure  Local Anesthetic: Lidocaine 1-2%  Position: Prone   Indications: 1. Lumbar spondylosis   2. Facet syndrome, lumbar    Stacy Martinez has been dealing with the above chronic pain for longer than three months and has either failed to respond, was unable to tolerate, or simply did not get enough benefit from other more conservative therapies including, but not limited to: 1. Over-the-counter medications 2. Anti-inflammatory medications 3. Muscle relaxants 4. Membrane stabilizers 5. Opioids 6. Physical therapy and/or  chiropractic manipulation 7. Modalities (Heat, ice, etc.) 8. Invasive techniques such as nerve blocks. Stacy Martinez has attained more than 50% relief of the pain from a series of diagnostic injections conducted in separate occasions.  Pain Score: Pre-procedure: 5 /10 Post-procedure: 0-No pain/10  Pre-op H&P Assessment:  Ms. Saine is a 68 y.o. (year old), female patient, seen today for interventional treatment. 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 Stacy Martinez has a current medication list which includes the following prescription(s): amlodipine, carvedilol, empagliflozin, enalapril, fenofibrate, furosemide, gabapentin, relion glucose test strips, basaglar kwikpen, ondansetron, relion ultra thin lancets, sitagliptin, tirzepatide, tizanidine, and ulticare mini pen needles. Her primarily concern today is the Back Pain (lower)   Initial Vital Signs:  Pulse/HCG Rate: 74ECG Heart Rate: 81 Temp: 98.2 F (36.8 C) Resp: 16 BP: (!) 171/78 (pt states she did not take BP med this am) SpO2: 100 %  BMI: Estimated body mass index is 40.24 kg/m as calculated from the following:   Height as of this encounter: 5\' 2"  (1.575 m).   Weight as of this encounter: 220 lb (99.8 kg).  Risk Assessment: Allergies: Reviewed. She is allergic to prednisone and metformin and related.  Allergy Precautions: None required Coagulopathies: Reviewed. None identified.  Blood-thinner therapy: None at this time Active Infection(s): Reviewed. None identified. Stacy Martinez is afebrile  Site Confirmation: Stacy Martinez was asked to confirm the procedure and laterality before marking the site Procedure checklist: Completed Consent: Before the procedure and under the influence of no sedative(s), amnesic(s), or anxiolytics, the  patient was informed of the treatment options, risks  and possible complications. To fulfill our ethical and legal obligations, as recommended by the American Medical Association's Code of Ethics, I have informed the patient of my clinical impression; the nature and purpose of the treatment or procedure; the risks, benefits, and possible complications of the intervention; the alternatives, including doing nothing; the risk(s) and benefit(s) of the alternative treatment(s) or procedure(s); and the risk(s) and benefit(s) of doing nothing. The patient was provided information about the general risks and possible complications associated with the procedure. These may include, but are not limited to: failure to achieve desired goals, infection, bleeding, organ or nerve damage, allergic reactions, paralysis, and death. In addition, the patient was informed of those risks and complications associated to Spine-related procedures, such as failure to decrease pain; infection (i.e.: Meningitis, epidural or intraspinal abscess); bleeding (i.e.: epidural hematoma, subarachnoid hemorrhage, or any other type of intraspinal or peri-dural bleeding); organ or nerve damage (i.e.: Any type of peripheral nerve, nerve root, or spinal cord injury) with subsequent damage to sensory, motor, and/or autonomic systems, resulting in permanent pain, numbness, and/or weakness of one or several areas of the body; allergic reactions; (i.e.: anaphylactic reaction); and/or death. Furthermore, the patient was informed of those risks and complications associated with the medications. These include, but are not limited to: allergic reactions (i.e.: anaphylactic or anaphylactoid reaction(s)); adrenal axis suppression; blood sugar elevation that in diabetics may result in ketoacidosis or comma; water retention that in patients with history of congestive heart failure may result in shortness of breath, pulmonary edema, and decompensation with resultant  heart failure; weight gain; swelling or edema; medication-induced neural toxicity; particulate matter embolism and blood vessel occlusion with resultant organ, and/or nervous system infarction; and/or aseptic necrosis of one or more joints. Finally, the patient was informed that Medicine is not an exact science; therefore, there is also the possibility of unforeseen or unpredictable risks and/or possible complications that may result in a catastrophic outcome. The patient indicated having understood very clearly. We have given the patient no guarantees and we have made no promises. Enough time was given to the patient to ask questions, all of which were answered to the patient's satisfaction. Ms. Ignasiak has indicated that she wanted to continue with the procedure. Attestation: I, the ordering provider, attest that I have discussed with the patient the benefits, risks, side-effects, alternatives, likelihood of achieving goals, and potential problems during recovery for the procedure that I have provided informed consent. Date  Time: 12/02/2023  8:08 AM  Pre-Procedure Preparation:  Monitoring: As per clinic protocol. Respiration, ETCO2, SpO2, BP, heart rate and rhythm monitor placed and checked for adequate function Safety Precautions: Patient was assessed for positional comfort and pressure points before starting the procedure. Time-out: I initiated and conducted the "Time-out" before starting the procedure, as per protocol. The patient was asked to participate by confirming the accuracy of the "Time Out" information. Verification of the correct person, site, and procedure were performed and confirmed by me, the nursing staff, and the patient. "Time-out" conducted as per Joint Commission's Universal Protocol (UP.01.01.01). Time: 0857  Description of Procedure:          Laterality:  Bilateral Levels:  L3, L4, L5, Medial Branch Level(s), at the L3-4, L4-5, lumbar facet joints. Area Prepped:  Lumbosacral DuraPrep (Iodine Povacrylex [0.7% available iodine] and Isopropyl Alcohol, 74% w/w) Safety Precautions: Aspiration looking for blood return was conducted prior to all injections. At no point did we inject any substances, as a needle was being advanced. Before injecting,  the patient was told to immediately notify me if she was experiencing any new onset of "ringing in the ears, or metallic taste in the mouth". No attempts were made at seeking any paresthesias. Safe injection practices and needle disposal techniques used. Medications properly checked for expiration dates. SDV (single dose vial) medications used. After the completion of the procedure, all disposable equipment used was discarded in the proper designated medical waste containers. Local Anesthesia: Protocol guidelines were followed. The patient was positioned over the fluoroscopy table. The area was prepped in the usual manner. The time-out was completed. The target area was identified using fluoroscopy. A 12-in long, straight, sterile hemostat was used with fluoroscopic guidance to locate the targets for each level blocked. Once located, the skin was marked with an approved surgical skin marker. Once all sites were marked, the skin (epidermis, dermis, and hypodermis), as well as deeper tissues (fat, connective tissue and muscle) were infiltrated with a small amount of a short-acting local anesthetic, loaded on a 10cc syringe with a 25G, 1.5-in  Needle. An appropriate amount of time was allowed for local anesthetics to take effect before proceeding to the next step. Local Anesthetic: Lidocaine 2.0% The unused portion of the local anesthetic was discarded in the proper designated containers. Technical explanation of process:   Radiofrequency Ablation (RFA) L3 Medial Branch Nerve RFA: The target area for the L3 medial branch is at the junction of the postero-lateral aspect of the superior articular process and the superior, posterior,  and medial edge of the transverse process of L4. Under fluoroscopic guidance, a Radiofrequency needle was inserted until contact was made with os over the superior postero-lateral aspect of the pedicular shadow (target area). Sensory and motor testing was conducted to properly adjust the position of the needle. Once satisfactory placement of the needle was achieved, the numbing solution was slowly injected after negative aspiration for blood. 2.0 mL of the nerve block solution was injected without difficulty or complication. After waiting for at least 3 minutes, the ablation was performed. Once completed, the needle was removed intact. L4 Medial Branch Nerve RFA: The target area for the L4 medial branch is at the junction of the postero-lateral aspect of the superior articular process and the superior, posterior, and medial edge of the transverse process of L5. Under fluoroscopic guidance, a Radiofrequency needle was inserted until contact was made with os over the superior postero-lateral aspect of the pedicular shadow (target area). Sensory and motor testing was conducted to properly adjust the position of the needle. Once satisfactory placement of the needle was achieved, the numbing solution was slowly injected after negative aspiration for blood. 2.0 mL of the nerve block solution was injected without difficulty or complication. After waiting for at least 3 minutes, the ablation was performed. Once completed, the needle was removed intact. L5 Medial Branch Nerve RFA: The target area for the L5 medial branch is at the junction of the postero-lateral aspect of the superior articular process of S1 and the superior, posterior, and medial edge of the sacral ala. Under fluoroscopic guidance, a Radiofrequency needle was inserted until contact was made with os over the superior postero-lateral aspect of the pedicular shadow (target area). Sensory and motor testing was conducted to properly adjust the position of the  needle. Once satisfactory placement of the needle was achieved, the numbing solution was slowly injected after negative aspiration for blood. 2.0 mL of the nerve block solution was injected without difficulty or complication. After waiting for at least 3  minutes, the ablation was performed. Once completed, the needle was removed intact.  Radiofrequency lesioning (ablation):  Radiofrequency Generator: NeuroTherm [id number] Sensory Stimulation Parameters: 50 Hz was used to locate & identify the nerve, making sure that the needle was positioned such that there was no sensory stimulation below 0.3 V or above 0.7 V. Motor Stimulation Parameters: 2 Hz was used to evaluate the motor component. Care was taken not to lesion any nerves that demonstrated motor stimulation of the lower extremities at an output of less than 2.5 times that of the sensory threshold, or a maximum of 2.0 V. Lesioning Technique Parameters: Standard Radiofrequency settings. (Not bipolar or pulsed.) Temperature Settings: 80 degrees C Lesioning time: 60 seconds Intra-operative Compliance: Compliant Materials & Medications: Needle(s) (Electrode/Cannula) Type: Teflon-coated, curved tip, Radiofrequency needle(s) Gauge: 22G Length: 10cm Numbing solution: 6 cc solution made of  5 cc of 0.2% ropivacaine, 1 cc of Decadron 10 mg/cc.  2 cc injected at each level above for  the RIGHT side after sensorimotor testing, prior to lesioning.                       6 cc solution made of  5 cc of 0.2% ropivacaine, 1 cc of Decadron 10 mg/cc.  2 cc injected at each level above for  the LEFT  side after sensorimotor testing, prior to lesioning.   Once the entire procedure was completed, the treated area was cleaned, making sure to leave some of the prepping solution back to take advantage of its long term bactericidal properties.    Illustration of the posterior view of the lumbar spine and the posterior neural structures. Laminae of L2 through S1 are  labeled. DPRL5, dorsal primary ramus of L5; DPRS1, dorsal primary ramus of S1; DPR3, dorsal primary ramus of L3; FJ, facet (zygapophyseal) joint L3-L4; I, inferior articular process of L4; LB1, lateral branch of dorsal primary ramus of L1; IAB, inferior articular branches from L3 medial branch (supplies L4-L5 facet joint); IBP, intermediate branch plexus; MB3, medial branch of dorsal primary ramus of L3; NR3, third lumbar nerve root; S, superior articular process of L5; SAB, superior articular branches from L4 (supplies L4-5 facet joint also); TP3, transverse process of L3.  Vitals:   12/02/23 0915 12/02/23 0920 12/02/23 0925 12/02/23 0932  BP: (!) 140/78 (!) 152/80 (!) 142/69 (!) 140/60  Pulse:    76  Resp: 11 16 12 16   Temp:      SpO2: 97% 97% 95% 100%  Weight:      Height:       Start Time: 0857 hrs. End Time: 0924 hrs.  Imaging Guidance (Spinal):          Type of Imaging Technique: Fluoroscopy Guidance (Spinal) Indication(s): Assistance in needle guidance and placement for procedures requiring needle placement in or near specific anatomical locations not easily accessible without such assistance. Exposure Time: Please see nurses notes. Contrast: None used. Fluoroscopic Guidance: I was personally present during the use of fluoroscopy. "Tunnel Vision Technique" used to obtain the best possible view of the target area. Parallax error corrected before commencing the procedure. "Direction-depth-direction" technique used to introduce the needle under continuous pulsed fluoroscopy. Once target was reached, antero-posterior, oblique, and lateral fluoroscopic projection used confirm needle placement in all planes. Images permanently stored in EMR. Interpretation: No contrast injected. I personally interpreted the imaging intraoperatively. Adequate needle placement confirmed in multiple planes. Permanent images saved into the patient's record.   Post-operative Assessment:  Post-procedure Vital  Signs:  Pulse/HCG Rate: 7674 Temp: 98.2 F (36.8 C) Resp: 16 BP: (!) 140/60 SpO2: 100 %  EBL: None  Complications: No immediate post-treatment complications observed by team, or reported by patient.  Note: The patient tolerated the entire procedure well. A repeat set of vitals were taken after the procedure and the patient was kept under observation following institutional policy, for this type of procedure. Post-procedural neurological assessment was performed, showing return to baseline, prior to discharge. The patient was provided with post-procedure discharge instructions, including a section on how to identify potential problems. Should any problems arise concerning this procedure, the patient was given instructions to immediately contact us, at any time, without hesitation. In any case, we plan to contact the patient by telephone for a follow-up status report regarding this interventional procedure.  Comments:  No additional relevant information.  Plan of Care  Orders:  Orders Placed This Encounter  Procedures   DG PAIN CLINIC C-ARM 1-60 MIN NO REPORT    Intraoperative interpretation by procedural physician at [facility].    Standing Status:   Standing    Number of Occurrences:   1    Reason for exam::   Assistance in needle guidance and placement for procedures requiring needle placement in or near specific anatomical locations not easily accessible without such assistance.   Medications ordered for procedure: Meds ordered this encounter  Medications   DISCONTD: iopamidol (ISOVUE-M) 41 % intrathecal injection 10 mL    Must be Myelogram-compatible. If not available, you may substitute with a water-soluble, non-ionic, hypoallergenic, myelogram-compatible radiological contrast medium.   lidocaine (XYLOCAINE) 2 % (with pres) injection 400 mg   lactated ringers infusion   midazolam (VERSED) injection 0.5-2 mg    Make sure Flumazenil is available in the pyxis when using  this medication. If oversedation occurs, administer 0.2 mg IV over 15 sec. If after 45 sec no response, administer 0.2 mg again over 1 min; may repeat at 1 min intervals; not to exceed 4 doses (1 mg)   ropivacaine (PF) 2 mg/mL (0.2%) (NAROPIN) injection 18 mL   dexamethasone (DECADRON) injection 20 mg   Medications administered: We administered lidocaine, lactated ringers, midazolam, ropivacaine (PF) 2 mg/mL (0.2%), and dexamethasone.  See the medical record for exact dosing, route, and time of administration.  Follow-up plan:   Return in about 8 weeks (around 01/27/2024) for PPE, F2F.     Recent Visits Date Type Provider Dept  11/16/23 Procedure visit Edward Jolly, MD [facility]  10/20/23 Office Visit Edward Jolly, MD [facility]  Showing recent visits within past 90 days and meeting all other requirements Today's Visits Date Type Provider Dept  12/02/23 Procedure visit Edward Jolly, MD [facility]  Showing today's visits and meeting all other requirements Future Appointments Date Type Provider Dept  01/27/24 Appointment Edward Jolly, MD [facility]  Showing future appointments within next 90 days and meeting all other requirements  Disposition: Discharge home  Discharge (Date  Time): 12/02/2023; 0940 hrs.   Primary Care Physician: Lorre Munroe, NP Location: [facility] Note by: Edward Jolly, MD Date: 12/02/2023; Time: 9:41 AM  Disclaimer:  Medicine is not an exact science. The only guarantee in medicine is that nothing is guaranteed. It is important to note that the decision to proceed with this intervention was based on the information collected from the patient. The Data and conclusions were drawn from the patient's questionnaire, the interview, and the physical examination.  Because the information was provided in large part by the patient, it cannot be guaranteed that it has not been purposely or  unconsciously manipulated. Every effort has been made to obtain as much relevant data as possible for this evaluation. It is important to note that the conclusions that lead to this procedure are derived in large part from the available data. Always take into account that the treatment will also be dependent on availability of resources and existing treatment guidelines, considered by other Pain Management Practitioners as being common knowledge and practice, at the time of the intervention. For Medico-Legal purposes, it is also important to point out that variation in procedural techniques and pharmacological choices are the acceptable norm. The indications, contraindications, technique, and results of the above procedure should only be interpreted and judged by a Board-Certified Interventional Pain Specialist with extensive familiarity and expertise in the same exact procedure and technique.

## 2023-12-03 ENCOUNTER — Telehealth: Payer: Self-pay

## 2023-12-03 ENCOUNTER — Encounter: Payer: Self-pay | Admitting: Internal Medicine

## 2023-12-03 NOTE — Telephone Encounter (Signed)
 Post procedure follow up.  Patient states she is doing well.   ?

## 2023-12-03 NOTE — Telephone Encounter (Signed)
 Spoke with Monique at [facility] patient can pick up her prescriptions of Januvia and Jardiance tomorrow for no charge.

## 2023-12-23 ENCOUNTER — Other Ambulatory Visit: Payer: Self-pay | Admitting: Internal Medicine

## 2023-12-24 NOTE — Telephone Encounter (Signed)
 Requested medication (s) are due for refill today: Yes  Requested medication (s) are on the active medication list: Yes  Last refill:  10/05/23  Future visit scheduled: Yes  Notes to clinic:  Unable to refill due to no refill protocol for this medication.      Requested Prescriptions  Pending Prescriptions Disp Refills   MOUNJARO  5 MG/0.5ML Pen [Pharmacy Med Name: Mounjaro  5 MG/0.5ML Subcutaneous Solution Pen-injector] 12 mL 0    Sig: INJECT 5MG  SUBCUTANEOUSLY  ONCE A WEEK     Off-Protocol Failed - 12/24/2023  4:50 PM      Failed - Medication not assigned to a protocol, review manually.      Passed - Valid encounter within last 12 months    Recent Outpatient Visits           3 weeks ago Urinary urgency   [place], [place] W, NP   3 weeks ago DM type 2 with diabetic peripheral neuropathy [facility])    [facility] [place], [place], [place]

## 2023-12-28 ENCOUNTER — Other Ambulatory Visit: Payer: Self-pay | Admitting: Internal Medicine

## 2023-12-28 NOTE — Telephone Encounter (Signed)
 Copied from CRM [id number]. Topic: Clinical - Medication Refill >> Dec 28, 2023  2:13 PM Alpha Arts wrote: Most Recent Primary Care Visit:  Provider: Carollynn Cirri  Department: [facility]  Visit Type: OFFICE VISIT  Date: 12/01/2023  Medication: tirzepatide  (MOUNJARO ) 5 MG/0.5ML Pen   Has the patient contacted their pharmacy? Yes (Agent: If no, request that the patient contact the pharmacy for the refill. If patient does not wish to contact the pharmacy document the reason why and proceed with request.) (Agent: If yes, when and what did the pharmacy advise?) Contact Dr.  Is this the correct pharmacy for this prescription? Yes If no, delete pharmacy and type the correct one.  This is the patient's preferred pharmacy:   [facility] [address] [place] - [address] [place] [address] Phone: [phone] Fax: [phone]   Has the prescription been filled recently? Yes  Is the patient out of the medication? No  Has the patient been seen for an appointment in the last year OR does the patient have an upcoming appointment? Yes  Can we respond through MyChart? Yes  Agent: Please be advised that Rx refills may take up to 3 business days. We ask that you follow-up with your pharmacy.

## 2023-12-30 ENCOUNTER — Other Ambulatory Visit: Payer: Self-pay | Admitting: Internal Medicine

## 2023-12-30 DIAGNOSIS — I10 Essential (primary) hypertension: Secondary | ICD-10-CM

## 2023-12-30 NOTE — Telephone Encounter (Signed)
 Requested medication (s) are due for refill today: yes  Requested medication (s) are on the active medication list: yes  Last refill:  10/05/23 6 ml  Future visit scheduled: yes  Notes to clinic:  med not assigned a protocol   Requested Prescriptions  Pending Prescriptions Disp Refills   tirzepatide  (MOUNJARO ) 5 MG/0.5ML Pen 6 mL 0    Sig: Inject 5 mg into the skin once a week.     Off-Protocol Failed - 12/30/2023  3:48 PM      Failed - Medication not assigned to a protocol, review manually.      Passed - Valid encounter within last 12 months    Recent Outpatient Visits           4 weeks ago Urinary urgency   [facility] [place], [place] W, NP   1 month ago DM type 2 with diabetic peripheral neuropathy [facility])    [facility] [place], [place], [place]

## 2023-12-31 ENCOUNTER — Telehealth: Payer: Self-pay

## 2023-12-31 MED ORDER — TIRZEPATIDE 5 MG/0.5ML ~~LOC~~ SOAJ: 5.0000 mg | SUBCUTANEOUS | 0 refills | Status: DC

## 2023-12-31 NOTE — Telephone Encounter (Addendum)
 Stacy Martinez (Key: [id number]) Rx #: [id number] Mounjaro  5MG /0.5ML auto-injectors Form [employer] Medicare Part D Electronic Prior Authorization Form (2017 NCPDP) Sent to plan   Authorization Expiration Date: August 31, 2024.

## 2024-01-02 NOTE — Telephone Encounter (Signed)
 Requested Prescriptions  Pending Prescriptions Disp Refills   furosemide  (LASIX ) 40 MG tablet [Pharmacy Med Name: Furosemide  40 MG Oral Tablet] 90 tablet 0    Sig: Take 1 tablet (40 mg total) by mouth daily.     Cardiovascular:  Diuretics - Loop Failed - 01/02/2024 12:38 AM      Failed - Mg Level in normal range and within 180 days    No results found for: "MG"       Failed - Last BP in normal range    BP Readings from Last 1 Encounters:  12/02/23 (!) 140/60         Passed - K in normal range and within 180 days    Potassium  Date Value Ref Range Status  11/27/2023 4.3 3.5 - 5.3 mmol/L Final         Passed - Ca in normal range and within 180 days    Calcium   Date Value Ref Range Status  11/27/2023 9.1 8.6 - 10.4 mg/dL Final         Passed - Na in normal range and within 180 days    Sodium  Date Value Ref Range Status  11/27/2023 138 135 - 146 mmol/L Final  11/30/2015 136 134 - 144 mmol/L Final         Passed - Cr in normal range and within 180 days    Creat  Date Value Ref Range Status  11/27/2023 0.67 0.50 - 1.05 mg/dL Final   Creatinine, Urine  Date Value Ref Range Status  11/27/2023 85 20 - 275 mg/dL Final         Passed - Cl in normal range and within 180 days    Chloride  Date Value Ref Range Status  11/27/2023 103 98 - 110 mmol/L Final         Passed - Valid encounter within last 6 months    Recent Outpatient Visits           1 month ago Urinary urgency   [facility] [place], [place], NP   1 month ago DM type 2 with diabetic peripheral neuropathy [facility])    [facility] [place], [place], [place]

## 2024-01-25 ENCOUNTER — Other Ambulatory Visit: Payer: Self-pay | Admitting: Internal Medicine

## 2024-01-25 DIAGNOSIS — I10 Essential (primary) hypertension: Secondary | ICD-10-CM

## 2024-01-26 ENCOUNTER — Ambulatory Visit
Payer: Self-pay | Attending: Student in an Organized Health Care Education/Training Program | Admitting: Student in an Organized Health Care Education/Training Program

## 2024-01-26 ENCOUNTER — Encounter: Payer: Self-pay | Admitting: Student in an Organized Health Care Education/Training Program

## 2024-01-26 VITALS — BP 102/61 | HR 73 | Temp 97.9°F | Resp 16 | Ht 62.0 in | Wt 220.0 lb

## 2024-01-26 DIAGNOSIS — M170 Bilateral primary osteoarthritis of knee: Secondary | ICD-10-CM | POA: Diagnosis present

## 2024-01-26 DIAGNOSIS — M47816 Spondylosis without myelopathy or radiculopathy, lumbar region: Secondary | ICD-10-CM | POA: Insufficient documentation

## 2024-01-26 DIAGNOSIS — G894 Chronic pain syndrome: Secondary | ICD-10-CM | POA: Diagnosis present

## 2024-01-26 NOTE — Progress Notes (Signed)
 Safety precautions to be maintained throughout the outpatient stay will include: orient to surroundings, keep bed in low position, maintain call bell within reach at all times, provide assistance with transfer out of bed and ambulation.

## 2024-01-26 NOTE — Progress Notes (Signed)
 PROVIDER NOTE: Interpretation of information contained herein should be left to medically-trained personnel. Specific patient instructions are provided elsewhere under "Patient Instructions" section of medical record. This document was created in part using AI and STT-dictation technology, any transcriptional errors that may result from this process are unintentional.  Patient: Stacy Martinez  Service: E/M   PCP: Carollynn Cirri, NP  DOB: February 16, 1956  DOS: 01/26/2024  Provider: Cephus Collin, MD  MRN: [id number]  Delivery: Face-to-face  Specialty: Interventional Pain Management  Type: Established Patient  Setting: Ambulatory outpatient facility  Specialty designation: 09  Referring Prov.: Carollynn Cirri, NP  Location: Outpatient office facility       History of present illness (HPI) Stacy Martinez, a 68 y.o. year old female, is here today because of her Lumbar spondylosis [M47.816]. Ms. Shain's primary complain today is Back Pain (lower) and Knee Pain (B/L, left is worse)  Pertinent problems: Ms. Lemar has Spondylosis without myelopathy or radiculopathy, lumbar region on their pertinent problem list.  Pain Assessment: Severity of Chronic pain is reported as a 7 /10. Location: Back Lower, Left/down legs to knees bilat. Onset: More than a month ago. Quality: Aching, Dull. Timing: Constant. Modifying factor(s): sitting. Vitals:  height is 5\' 2"  (1.575 m) and weight is 220 lb (99.8 kg). Her temperature is 97.9 F (36.6 C). Her blood pressure is 102/61 and her pulse is 73. Her respiration is 16 and oxygen saturation is 100%.  BMI: Estimated body mass index is 40.24 kg/m as calculated from the following:   Height as of this encounter: 5\' 2"  (1.575 m).   Weight as of this encounter: 220 lb (99.8 kg).  Last encounter: 10/20/2023. Last procedure: 12/02/2023.  Reason for encounter:   Post-procedure evaluation   Procedure:          Anesthesia, Analgesia, Anxiolysis:  Type: Therapeutic Superolateral,  Superomedial, and Inferomedial, Genicular Nerve Radiofrequency Ablation (destruction).   #3 Region: Lateral, Anterior, and Medial aspects of the knee joint, above and below the knee joint proper. Level: Superior and inferior to the knee joint. Laterality: Bilateral  Anesthesia: Local (1-2% Lidocaine )  Sedation: Moderate  Guidance: Fluoroscopy           Position: Supine   Indications: 1. Bilateral primary osteoarthritis of knee   2. Chronic pain syndrome    Ms. Faulk has been dealing with the above chronic pain for longer than three months and has either failed to respond, was unable to tolerate, or simply did not get enough benefit from other more conservative therapies including, but not limited to: 1. Over-the-counter medications 2. Anti-inflammatory medications 3. Muscle relaxants 4. Membrane stabilizers 5. Opioids 6. Physical therapy and/or chiropractic manipulation 7. Modalities (Heat, ice, etc.) 8. Invasive techniques such as nerve blocks. Ms. Morandi has attained more than 50% relief of the pain from a series of diagnostic injections conducted in separate occasions.  Pain Score: Pre-procedure: 6 /10 Post-procedure: 3 /10    Effectiveness:  Initial hour after procedure: 100 %  Subsequent 4-6 hours post-procedure: 100 % Analgesia past initial 6 hours: 50 % Ongoing improvement:  Analgesic:  50% Function: Somewhat improved ROM: Somewhat improved    ROS  Constitutional: Denies any fever or chills Gastrointestinal: No reported hemesis, hematochezia, vomiting, or acute GI distress Musculoskeletal: Denies any acute onset joint swelling, redness, loss of ROM, or weakness Neurological: No reported episodes of acute onset apraxia, aphasia, dysarthria, agnosia, amnesia, paralysis, loss of coordination, or loss of consciousness  Medication Review  Basaglar   KwikPen, Insulin  Pen Needle, ReliOn Ultra Thin Lancets, amLODipine , carvedilol , empagliflozin , enalapril , fenofibrate ,  furosemide , gabapentin , glucose blood, ondansetron , sitaGLIPtin , tiZANidine , and tirzepatide   History Review  Allergy: Ms. Grieves is allergic to prednisone and metformin  and related. Drug: Ms. Porta  reports no history of drug use. Alcohol:  reports no history of alcohol use. Tobacco:  reports that she has never smoked. She has never used smokeless tobacco. Social: Ms. Bruinsma  reports that she has never smoked. She has never used smokeless tobacco. She reports that she does not drink alcohol and does not use drugs. Medical:  has a past medical history of Allergy, Arthritis, Diabetes mellitus without complication (HCC), Enlarged heart, and Hypertension. Surgical: Ms. 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 Family: family history includes Cancer in her father; Diabetes in her mother; Mental illness in her son.  Laboratory Chemistry Profile   Renal Lab Results  Component Value Date   BUN 19 11/27/2023   CREATININE 0.67 11/27/2023   LABCREA 85 11/27/2023   BCR SEE NOTE: 11/27/2023   GFRAA 102 03/22/2019   GFRNONAA >60 11/30/2021    Hepatic Lab Results  Component Value Date   AST 11 11/27/2023   ALT 8 11/27/2023   ALBUMIN 3.8 11/15/2021   ALKPHOS 95 11/15/2021   LIPASE 19 12/18/2013    Electrolytes Lab Results  Component Value Date   NA 138 11/27/2023   K 4.3 11/27/2023   CL 103 11/27/2023   CALCIUM  9.1 11/27/2023    Bone No results found for: "VD25OH", "VD125OH2TOT", "UE4540JW1", "XB1478GN5", "25OHVITD1", "25OHVITD2", "25OHVITD3", "TESTOFREE", "TESTOSTERONE"  Inflammation (CRP: Acute Phase) (ESR: Chronic Phase) Lab Results  Component Value Date   LATICACIDVEN 1.72 01/02/2014         Note: Above Lab results reviewed.  Recent Imaging Review  DG PAIN CLINIC  C-ARM 1-60 MIN NO REPORT Fluoro was used, but no Radiologist interpretation will be provided.  Please refer to "NOTES" tab for provider progress note. Note: Reviewed         Physical Exam  General appearance: Well nourished, well developed, and well hydrated. In no apparent acute distress Mental status: Alert, oriented x 3 (person, place, & time)       Respiratory: No evidence of acute respiratory distress Eyes: PERLA Vitals: BP 102/61   Pulse 73   Temp 97.9 F (36.6 C)   Resp 16   Ht 5\' 2"  (1.575 m)   Wt 220 lb (99.8 kg)   SpO2 100%   BMI 40.24 kg/m  BMI: Estimated body mass index is 40.24 kg/m as calculated from the following:   Height as of this encounter: 5\' 2"  (1.575 m).   Weight as of this encounter: 220 lb (99.8 kg). Ideal: Ideal body weight: 50.1 kg (110 lb 7.2 oz) Adjusted ideal body weight: 70 kg (154 lb 4.3 oz)  Improvement in low back pain and lumbar facet loading  Assessment   Diagnosis Status  1. Lumbar spondylosis   2. Facet syndrome, lumbar   3. Bilateral primary osteoarthritis of knee   4. Chronic pain syndrome    Controlled Controlled Having a Flare-up    Plan of Care   Positive response to bilateral lumbar radiofrequency ablation as detailed above.  Continue to monitor symptoms and repeat as needed in the future.  Patient has an upcoming appointment with orthopedics to  discuss knee replacement surgery.  I wished her the best with that.  Return if symptoms worsen or fail to improve.    Recent Visits Date Type Provider Dept  12/02/23 Procedure visit Cephus Collin, MD [facility]  11/16/23 Procedure visit Cephus Collin, MD [facility]  Showing recent visits within past 90 days and meeting all other requirements Today's Visits Date Type Provider Dept  01/26/24 Office Visit Cephus Collin, MD [facility]  Showing today's visits and meeting all other requirements Future Appointments No visits were found meeting these  conditions. Showing future appointments within next 90 days and meeting all other requirements  I discussed the assessment and treatment plan with the patient. The patient was provided an opportunity to ask questions and all were answered. The patient agreed with the plan and demonstrated an understanding of the instructions.  Patient advised to call back or seek an in-person evaluation if the symptoms or condition worsens.  Duration of encounter: 20 minutes.  Total time on encounter, as per AMA guidelines included both the face-to-face and non-face-to-face time personally spent by the physician and/or other qualified health care professional(s) on the day of the encounter (includes time in activities that require the physician or other qualified health care professional and does not include time in activities normally performed by clinical staff). Physician's time may include the following activities when performed: Preparing to see the patient (e.g., pre-charting review of records, searching for previously ordered imaging, lab work, and nerve conduction tests) Review of prior analgesic pharmacotherapies. Reviewing PMP Interpreting ordered tests (e.g., lab work, imaging, nerve conduction tests) Performing post-procedure evaluations, including interpretation of diagnostic procedures Obtaining and/or reviewing separately obtained history Performing a medically appropriate examination and/or evaluation Counseling and educating the patient/family/caregiver Ordering medications, tests, or procedures Referring and communicating with other health care professionals (when not separately reported) Documenting clinical information in the electronic or other health record Independently interpreting results (not separately reported) and communicating results to the patient/ family/caregiver Care coordination (not separately reported)  Note by: Cephus Collin, MD (TTS and AI technology used. I apologize for  any typographical errors that were not detected and corrected.) Date: 01/26/2024; Time: 11:45 AM

## 2024-01-27 ENCOUNTER — Ambulatory Visit: Admitting: Student in an Organized Health Care Education/Training Program

## 2024-01-27 NOTE — Telephone Encounter (Signed)
 Requested Prescriptions  Pending Prescriptions Disp Refills   amLODipine  (NORVASC ) 10 MG tablet [Pharmacy Med Name: amLODIPine  Besylate 10 MG Oral Tablet] 30 tablet 0    Sig: Take 1 tablet (10 mg total) by mouth daily.     Cardiovascular: Calcium  Channel Blockers 2 Passed - 01/27/2024  3:29 PM      Passed - Last BP in normal range    BP Readings from Last 1 Encounters:  01/26/24 102/61         Passed - Last Heart Rate in normal range    Pulse Readings from Last 1 Encounters:  01/26/24 73         Passed - Valid encounter within last 6 months    Recent Outpatient Visits           1 month ago Urinary urgency   [facility], [place], NP   2 months ago DM type 2 with diabetic peripheral neuropathy [facility])   [place] [facility] [place], [place], [place]

## 2024-01-28 ENCOUNTER — Telehealth: Payer: Self-pay | Admitting: Internal Medicine

## 2024-01-28 NOTE — Telephone Encounter (Signed)
 Received surgical clearance for patient for left TKA.  Needs to schedule surgical clearance appointment with me

## 2024-01-29 ENCOUNTER — Other Ambulatory Visit: Payer: Self-pay | Admitting: Student in an Organized Health Care Education/Training Program

## 2024-01-29 DIAGNOSIS — G894 Chronic pain syndrome: Secondary | ICD-10-CM

## 2024-01-29 DIAGNOSIS — M170 Bilateral primary osteoarthritis of knee: Secondary | ICD-10-CM

## 2024-01-29 DIAGNOSIS — M47816 Spondylosis without myelopathy or radiculopathy, lumbar region: Secondary | ICD-10-CM

## 2024-02-11 ENCOUNTER — Other Ambulatory Visit: Payer: Self-pay | Admitting: Orthopedic Surgery

## 2024-02-17 ENCOUNTER — Other Ambulatory Visit: Payer: Self-pay | Admitting: Internal Medicine

## 2024-02-19 NOTE — Telephone Encounter (Signed)
 Requested Prescriptions  Pending Prescriptions Disp Refills   gabapentin  (NEURONTIN ) 300 MG capsule [Pharmacy Med Name: Gabapentin  300 MG Oral Capsule] 180 capsule 1    Sig: Take 1 capsule by mouth twice daily     Neurology: Anticonvulsants - gabapentin  Passed - 02/19/2024 12:09 PM      Passed - Cr in normal range and within 360 days    Creat  Date Value Ref Range Status  11/27/2023 0.67 0.50 - 1.05 mg/dL Final   Creatinine, Urine  Date Value Ref Range Status  11/27/2023 85 20 - 275 mg/dL Final         Passed - Completed PHQ-2 or PHQ-9 in the last 360 days      Passed - Valid encounter within last 12 months    Recent Outpatient Visits           2 months ago Urinary urgency   [facility] [place], [place], NP   2 months ago DM type 2 with diabetic peripheral neuropathy [facility])   [place] [facility] [place], [place], [place]

## 2024-03-01 ENCOUNTER — Encounter
Admission: RE | Admit: 2024-03-01 | Discharge: 2024-03-01 | Disposition: A | Discharge status: Home / Self Care | Source: Ambulatory Visit | Attending: Orthopedic Surgery | Admitting: Orthopedic Surgery

## 2024-03-01 ENCOUNTER — Encounter: Payer: Self-pay | Admitting: Orthopedic Surgery

## 2024-03-01 ENCOUNTER — Other Ambulatory Visit: Payer: Self-pay

## 2024-03-01 VITALS — BP 126/69 | HR 67 | Ht 62.0 in | Wt 218.0 lb

## 2024-03-01 DIAGNOSIS — Z01810 Encounter for preprocedural cardiovascular examination: Secondary | ICD-10-CM | POA: Diagnosis not present

## 2024-03-01 DIAGNOSIS — Z01818 Encounter for other preprocedural examination: Secondary | ICD-10-CM | POA: Insufficient documentation

## 2024-03-01 DIAGNOSIS — E1142 Type 2 diabetes mellitus with diabetic polyneuropathy: Secondary | ICD-10-CM

## 2024-03-01 HISTORY — DX: Anemia, unspecified: D64.9

## 2024-03-01 LAB — COMPREHENSIVE METABOLIC PANEL WITH GFR
ALT: 14 U/L (ref 0–44)
AST: 15 U/L (ref 15–41)
Albumin: 3.6 g/dL (ref 3.5–5.0)
Alkaline Phosphatase: 58 U/L (ref 38–126)
Anion gap: 9 (ref 5–15)
BUN: 19 mg/dL (ref 8–23)
CO2: 23 mmol/L (ref 22–32)
Calcium: 8.6 mg/dL — ABNORMAL LOW (ref 8.9–10.3)
Chloride: 106 mmol/L (ref 98–111)
Creatinine, Ser: 0.69 mg/dL (ref 0.44–1.00)
GFR, Estimated: >60 mL/min (ref >60)
Glucose, Bld: 101 mg/dL — ABNORMAL HIGH (ref 70–99)
Potassium: 4.4 mmol/L (ref 3.5–5.1)
Sodium: 138 mmol/L (ref 135–145)
Total Bilirubin: 0.7 mg/dL (ref 0.0–1.2)
Total Protein: 7.1 g/dL (ref 6.5–8.1)

## 2024-03-01 LAB — URINALYSIS, ROUTINE W REFLEX MICROSCOPIC
Bilirubin Urine: NEGATIVE
Glucose, UA: >=500 mg/dL — AB
Hgb urine dipstick: NEGATIVE
Ketones, ur: NEGATIVE mg/dL
Leukocytes,Ua: NEGATIVE
Nitrite: NEGATIVE
Protein, ur: NEGATIVE mg/dL
Specific Gravity, Urine: 1.03 (ref 1.005–1.030)
pH: 5 (ref 5.0–8.0)

## 2024-03-01 LAB — CBC WITH DIFFERENTIAL/PLATELET
Abs Immature Granulocytes: 0.07 10*3/uL (ref 0.00–0.07)
Basophils Absolute: 0.1 10*3/uL (ref 0.0–0.1)
Basophils Relative: 1 %
Eosinophils Absolute: 0.9 10*3/uL — ABNORMAL HIGH (ref 0.0–0.5)
Eosinophils Relative: 8 %
HCT: 39.5 % (ref 36.0–46.0)
Hemoglobin: 12.8 g/dL (ref 12.0–15.0)
Immature Granulocytes: 1 %
Lymphocytes Relative: 29 %
Lymphs Abs: 3.5 10*3/uL (ref 0.7–4.0)
MCH: 26.9 pg (ref 26.0–34.0)
MCHC: 32.4 g/dL (ref 30.0–36.0)
MCV: 83.2 fL (ref 80.0–100.0)
Monocytes Absolute: 0.9 10*3/uL (ref 0.1–1.0)
Monocytes Relative: 7 %
Neutro Abs: 6.5 10*3/uL (ref 1.7–7.7)
Neutrophils Relative %: 54 %
Platelets: 429 10*3/uL — ABNORMAL HIGH (ref 150–400)
RBC: 4.75 MIL/uL (ref 3.87–5.11)
RDW: 15.3 % (ref 11.5–15.5)
WBC: 12 10*3/uL — ABNORMAL HIGH (ref 4.0–10.5)
nRBC: 0 % (ref 0.0–0.2)

## 2024-03-01 LAB — SURGICAL PCR SCREEN
MRSA, PCR: NEGATIVE
Staphylococcus aureus: NEGATIVE

## 2024-03-01 NOTE — Patient Instructions (Addendum)
 Your procedure is scheduled on: Monday 03/14/24 Report to the Registration Desk on the 1st floor of the [facility]. To find out your arrival time, please call [phone] between 1PM - 3PM on: Friday 03/11/24 If your arrival time is 6:00 am, do not arrive before that time as the [facility] entrance doors do not open until 6:00 am.  REMEMBER: Instructions that are not followed completely may result in serious medical risk, up to and including death; or upon the discretion of your surgeon and anesthesiologist your surgery may need to be rescheduled.  Do not eat food after midnight the night before surgery.  No gum chewing or hard candies.  You may however, drink CLEAR liquids up to 2 hours before you are scheduled to arrive for your surgery. Do not drink anything within 2 hours of your scheduled arrival time.  Clear liquids include: - water   In addition, your doctor has ordered for you to drink the provided:   Gatorade G2 Drinking this carbohydrate drink up to two hours before surgery helps to reduce insulin  resistance and improve patient outcomes. Please complete drinking 2 hours before scheduled arrival time.  One week prior to surgery: Stop Anti-inflammatories (NSAIDS) such as Advil, Aleve, Ibuprofen, Motrin, Naproxen, Naprosyn and Aspirin  based products such as Excedrin, Goody's Powder, BC Powder. Stop ANY OVER THE COUNTER supplements/vitamins until after surgery.  You may however, continue to take Tylenol  if needed for pain up until the day of surgery.  **Follow guidelines for insulin  and diabetes medications.** The night before surgery decrease your insulin  to half (6 units) Hold Mounjaro  for 7 days, last day Sunday 03/06/24.  Jardiance  hold for 3 days, last dose Thursday 03/10/24  Continue taking all of your other prescription medications up until the day of surgery.  ON THE DAY OF SURGERY ONLY TAKE THESE MEDICATIONS WITH SIPS OF WATER:  amLODipine  (NORVASC ) 10 MG tablet   carvedilol  (COREG ) 25 MG tablet  fenofibrate  54 MG tablet  gabapentin  (NEURONTIN ) 300 MG capsule   No Alcohol for 24 hours before or after surgery.  No Smoking including e-cigarettes for 24 hours before surgery.  No chewable tobacco products for at least 6 hours before surgery.  No nicotine patches on the day of surgery.  Do not use any recreational drugs for at least a week (preferably 2 weeks) before your surgery.  Please be advised that the combination of cocaine and anesthesia may have negative outcomes, up to and including death. If you test positive for cocaine, your surgery will be cancelled.  On the morning of surgery brush your teeth with toothpaste and water, you may rinse your mouth with mouthwash if you wish. Do not swallow any toothpaste or mouthwash.  Use CHG Soap or wipes as directed on instruction sheet.  Do not wear jewelry, make-up, hairpins, clips or nail polish.  For welded (permanent) jewelry: bracelets, anklets, waist bands, etc.  Please have this removed prior to surgery.  If it is not removed, there is a chance that hospital personnel will need to cut it off on the day of surgery.  Do not wear lotions, powders, or perfumes.   Do not shave body hair from the neck down 48 hours before surgery.  Contact lenses, hearing aids and dentures may not be worn into surgery.  Do not bring valuables to the hospital. [facility] is not responsible for any missing/lost belongings or valuables.   Notify your doctor if there is any change in your medical condition (cold,  fever, infection).  Wear comfortable clothing (specific to your surgery type) to the hospital.  After surgery, you can help prevent lung complications by doing breathing exercises.  Take deep breaths and cough every 1-2 hours. Your doctor may order a device called an Incentive Spirometer to help you take deep breaths.  If you are being admitted to the hospital overnight, leave your suitcase in the  car. After surgery it may be brought to your room.  In case of increased patient census, it may be necessary for you, the patient, to continue your postoperative care in the Same Day Surgery department.  If you are being discharged the day of surgery, you will not be allowed to drive home. You will need a responsible individual to drive you home and stay with you for 24 hours after surgery.   If you are taking public transportation, you will need to have a responsible individual with you.  Please call the Pre-admissions Testing Dept. at [phone] if you have any questions about these instructions.  Surgery Visitation Policy:  Patients having surgery or a procedure may have two visitors.  Children under the age of 10 must have an adult with them who is not the patient.  Inpatient Visitation:    Visiting hours are 7 a.m. to 8 p.m. Up to four visitors are allowed at one time in a patient room. The visitors may rotate out with other people during the day.  One visitor age 108 or older may stay with the patient overnight and must be in the room by 8 p.m.   Merchandiser, retail to address health-related social needs:  [email or web address]    Pre-operative 5 CHG Bath Instructions   You can play a key role in reducing the risk of infection after surgery. Your skin needs to be as free of germs as possible. You can reduce the number of germs on your skin by washing with CHG (chlorhexidine  gluconate) soap before surgery. CHG is an antiseptic soap that kills germs and continues to kill germs even after washing.   DO NOT use if you have an allergy to chlorhexidine /CHG or antibacterial soaps. If your skin becomes reddened or irritated, stop using the CHG and notify one of our RNs at [phone].   Please shower with the CHG soap starting 4 days before surgery using the following schedule:   Thursday 03/10/24 - Monday 03/14/24      Please keep in mind the following:   DO NOT shave, including legs and underarms, starting the day of your first shower.   You may shave your face at any point before/day of surgery.  Place clean sheets on your bed the day you start using CHG soap. Use a clean washcloth (not used since being washed) for each shower. DO NOT sleep with pets once you start using the CHG.   CHG Shower Instructions:  If you choose to wash your hair and private area, wash first with your normal shampoo/soap.  After you use shampoo/soap, rinse your hair and body thoroughly to remove shampoo/soap residue.  Turn the water OFF and apply about 3 tablespoons (45 ml) of CHG soap to a CLEAN washcloth.  Apply CHG soap ONLY FROM YOUR NECK DOWN TO YOUR TOES (washing for 3-5 minutes)  DO NOT use CHG soap on face, private areas, open wounds, or sores.  Pay special attention to the area where your surgery is being performed.  If you are having back surgery, having someone wash your back for you  may be helpful. Wait 2 minutes after CHG soap is applied, then you may rinse off the CHG soap.  Pat dry with a clean towel  Put on clean clothes/pajamas   If you choose to wear lotion, please use ONLY the CHG-compatible lotions on the back of this paper.     Additional instructions for the day of surgery: DO NOT APPLY any lotions, deodorants, cologne, or perfumes.   Put on clean/comfortable clothes.  Brush your teeth.  Ask your nurse before applying any prescription medications to the skin.      CHG Compatible Lotions   Aveeno Moisturizing lotion  Cetaphil Moisturizing Cream  Cetaphil Moisturizing Lotion  Clairol Herbal Essence Moisturizing Lotion, Dry Skin  Clairol Herbal Essence Moisturizing Lotion, Extra Dry Skin  Clairol Herbal Essence Moisturizing Lotion, Normal Skin  Curel Age Defying Therapeutic Moisturizing Lotion with Alpha Hydroxy  Curel Extreme Care Body Lotion  Curel Soothing Hands Moisturizing Hand Lotion  Curel Therapeutic Moisturizing Cream,  Fragrance-Free  Curel Therapeutic Moisturizing Lotion, Fragrance-Free  Curel Therapeutic Moisturizing Lotion, Original Formula  Eucerin Daily Replenishing Lotion  Eucerin Dry Skin Therapy Plus Alpha Hydroxy Crme  Eucerin Dry Skin Therapy Plus Alpha Hydroxy Lotion  Eucerin Original Crme  Eucerin Original Lotion  Eucerin Plus Crme Eucerin Plus Lotion  Eucerin TriLipid Replenishing Lotion  Keri Anti-Bacterial Hand Lotion  Keri Deep Conditioning Original Lotion Dry Skin Formula Softly Scented  Keri Deep Conditioning Original Lotion, Fragrance Free Sensitive Skin Formula  Keri Lotion Fast Absorbing Fragrance Free Sensitive Skin Formula  Keri Lotion Fast Absorbing Softly Scented Dry Skin Formula  Keri Original Lotion  Keri Skin Renewal Lotion Keri Silky Smooth Lotion  Keri Silky Smooth Sensitive Skin Lotion  Nivea Body Creamy Conditioning Oil  Nivea Body Extra Enriched Lotion  Nivea Body Original Lotion  Nivea Body Sheer Moisturizing Lotion Nivea Crme  Nivea Skin Firming Lotion  NutraDerm 30 Skin Lotion  NutraDerm Skin Lotion  NutraDerm Therapeutic Skin Cream  NutraDerm Therapeutic Skin Lotion  ProShield Protective Hand Cream  Provon moisturizing lotion  How to Use an Incentive Spirometer  An incentive spirometer is a tool that measures how well you are filling your lungs with each breath. Learning to take long, deep breaths using this tool can help you keep your lungs clear and active. This may help to reverse or lessen your chance of developing breathing (pulmonary) problems, especially infection. You may be asked to use a spirometer: After a surgery. If you have a lung problem or a history of smoking. After a long period of time when you have been unable to move or be active. If the spirometer includes an indicator to show the highest number that you have reached, your health care provider or respiratory therapist will help you set a goal. Keep a log of your progress as told by  your health care provider. What are the risks? Breathing too quickly may cause dizziness or cause you to pass out. Take your time so you do not get dizzy or light-headed. If you are in pain, you may need to take pain medicine before doing incentive spirometry. It is harder to take a deep breath if you are having pain. How to use your incentive spirometer  Sit up on the edge of your bed or on a chair. Hold the incentive spirometer so that it is in an upright position. Before you use the spirometer, breathe out normally. Place the mouthpiece in your mouth. Make sure your lips are closed tightly around it.  Breathe in slowly and as deeply as you can through your mouth, causing the piston or the ball to rise toward the top of the chamber. Hold your breath for 3-5 seconds, or for as long as possible. If the spirometer includes a coach indicator, use this to guide you in breathing. Slow down your breathing if the indicator goes above the marked areas. Remove the mouthpiece from your mouth and breathe out normally. The piston or ball will return to the bottom of the chamber. Rest for a few seconds, then repeat the steps 10 or more times. Take your time and take a few normal breaths between deep breaths so that you do not get dizzy or light-headed. Do this every 1-2 hours when you are awake. If the spirometer includes a goal marker to show the highest number you have reached (best effort), use this as a goal to work toward during each repetition. After each set of 10 deep breaths, cough a few times. This will help to make sure that your lungs are clear. If you have an incision on your chest or abdomen from surgery, place a pillow or a rolled-up towel firmly against the incision when you cough. This can help to reduce pain while taking deep breaths and coughing. General tips When you are able to get out of bed: Walk around often. Continue to take deep breaths and cough in order to clear your  lungs. Keep using the incentive spirometer until your health care provider says it is okay to stop using it. If you have been in the hospital, you may be told to keep using the spirometer at home. Contact a health care provider if: You are having difficulty using the spirometer. You have trouble using the spirometer as often as instructed. Your pain medicine is not giving enough relief for you to use the spirometer as told. You have a fever. Get help right away if: You develop shortness of breath. You develop a cough with bloody mucus from the lungs. You have fluid or blood coming from an incision site after you cough. Summary An incentive spirometer is a tool that can help you learn to take long, deep breaths to keep your lungs clear and active. You may be asked to use a spirometer after a surgery, if you have a lung problem or a history of smoking, or if you have been inactive for a long period of time. Use your incentive spirometer as instructed every 1-2 hours while you are awake. If you have an incision on your chest or abdomen, place a pillow or a rolled-up towel firmly against your incision when you cough. This will help to reduce pain. Get help right away if you have shortness of breath, you cough up bloody mucus, or blood comes from your incision when you cough. This information is not intended to replace advice given to you by your health care provider. Make sure you discuss any questions you have with your health care provider. Document Revised: 11/07/2019 Document Reviewed: 11/07/2019 Elsevier Patient Education  2023 [employer] Inc.    Preoperative Educational Videos for Total Hip, Knee and Shoulder Replacements  To better prepare for surgery, please view our videos that explain the physical activity and discharge planning required to have the best surgical recovery at [facility]  [email or web address]  Questions? Call [phone] or email [email or web address]

## 2024-03-03 ENCOUNTER — Ambulatory Visit (INDEPENDENT_AMBULATORY_CARE_PROVIDER_SITE_OTHER): Admitting: Internal Medicine

## 2024-03-03 ENCOUNTER — Encounter: Payer: Self-pay | Admitting: Internal Medicine

## 2024-03-03 VITALS — BP 118/70 | HR 68 | Ht 62.0 in | Wt 220.2 lb

## 2024-03-03 DIAGNOSIS — Z01818 Encounter for other preprocedural examination: Secondary | ICD-10-CM

## 2024-03-03 DIAGNOSIS — M170 Bilateral primary osteoarthritis of knee: Secondary | ICD-10-CM

## 2024-03-03 NOTE — Patient Instructions (Signed)
 Exercises for Chronic Knee Pain  Chronic knee pain is pain that lasts longer than 3 months. For most people with chronic knee pain, exercise and weight loss is an important part of treatment. Your health care provider may want you to focus on:  Making the muscles that support your knee stronger. This can take pressure off your knee and reduce pain.  Preventing knee stiffness.  How far you can move your knee, keeping it there or making it farther.  Losing weight (if this applies) to take pressure off your knee, lower your risk for injury, and make it easier for you to exercise.  Your provider will help you make an exercise program that fits your needs and physical abilities. Below are simple, low-impact exercises you can do at home. Ask your provider or physical therapist how often you should do your exercise program and how many times to repeat each exercise.  General safety tips    Get your provider's approval before doing any exercises.  Start slowly and stop any time you feel pain.  Do not exercise if your knee pain is flaring up.  Warm up first. Stretching a cold muscle can cause an injury. Do 5-10 minutes of easy movement or light stretching before beginning your exercises.  Do 5-10 minutes of low-impact activity (like walking or cycling) before starting strengthening exercises.  Contact your provider any time you have pain during or after exercising. Exercise can cause discomfort but should not be painful. It is normal to be a little stiff or sore after exercising.  Stretching and range-of-motion exercises  Front thigh stretch    Stand up straight and support your body by holding on to a chair or resting one hand on a Mastandrea.  With your legs straight and close together, bend one knee to lift your heel up toward your butt.  Using one hand for support, grab your ankle with your free hand.  Pull your foot up closer toward your butt to feel the stretch in front of your thigh.  Hold the stretch for 30  seconds.  Repeat __________ times. Complete this exercise __________ times a day.  Back thigh stretch    Sit on the floor with your back straight and your legs out straight in front of you.  Place the palms of your hands on the floor and slide them toward your feet as you bend at the hip.  Try to touch your nose to your knees and feel the stretch in the back of your thighs.  Hold for 30 seconds.  Repeat __________ times. Complete this exercise __________ times a day.  Calf stretch    Stand facing a Sheehy.  Place the palms of your hands flat against the Ghazi, arms extended, and lean slightly against the Muha.  Get into a lunge position with one leg bent at the knee and the other leg stretched out straight behind you.  Keep both feet facing the Ziesmer and increase the bend in your knee while keeping the heel of the other leg flat on the ground.  You should feel the stretch in your calf. Hold for 30 seconds.  Repeat __________ times. Complete this exercise __________ times a day.  Strengthening exercises  Straight leg lift    Lie on your back with one knee bent and the other leg out straight.  Slowly lift the straight leg without bending the knee.  Lift until your foot is about 12 inches (30 cm) off the floor.  Hold for  3-5 seconds and slowly lower your leg.  Repeat __________ times. Complete this exercise __________ times a day.  Single leg dip    Stand between two chairs and put both hands on the backs of the chairs for support.  Extend one leg out straight with your body weight resting on the heel of the standing leg.  Slowly bend your standing knee to dip your body to the level that is comfortable for you.  Hold for 3-5 seconds.  Repeat __________ times. Complete this exercise __________ times a day.  Hamstring curls    Stand straight, knees close together, facing the back of a chair.  Hold on to the back of a chair with both hands.  Keep one leg straight. Bend the other knee while bringing the heel up toward the butt  until the knee is bent at a 90-degree angle (right angle).  Hold for 3-5 seconds.  Repeat __________ times. Complete this exercise __________ times a day.  Neece squat    Stand straight with your back, hips, and head against a R[place].  Step forward one foot at a time with your back still against the Spargur.  Your feet should be 2 feet (61 cm) from the Jeanpaul at shoulder width. Keeping your back, hips, and head against the Colligan, slide down the Brenner to as close to a sitting position as you can get.  Hold for 5-10 seconds, then slowly slide back up.  Repeat __________ times. Complete this exercise __________ times a day.  Step-ups    Stand in front of a sturdy platform or stool that is about 6 inches (15 cm) high.  Slowly step up with your left / right foot, keeping your knee in line with your hip and foot. Do not let your knee bend so far that you cannot see your toes. Hold on to a chair for balance, but do not use it for support.  Slowly unlock your knee and lower yourself to the starting position.  Repeat __________ times. Complete this exercise __________ times a day.  Contact a health care provider if:  Your exercises cause pain.  Your pain is worse after you exercise.  Your pain prevents you from doing your exercises.  This information is not intended to replace advice given to you by your health care provider. Make sure you discuss any questions you have with your health care provider.  Document Revised: 09/02/2022 Document Reviewed: 09/02/2022  Elsevier Patient Education  2024 [employer].

## 2024-03-03 NOTE — Progress Notes (Signed)
 Subjective:    Patient ID: Stacy Martinez, female    DOB: 12-07-1955, 68 y.o.   MRN: [id number]  HPI  Patient presents to clinic today for surgical clearance.  She plans to have a left total knee arthroplasty performed by Dr. Lorelle on July 14th.  X-ray of the left knee from 06/2021 reviewed.  She is currently taking diclofenac , tizanidine , gabapentin  for pain. She is not on blood thinners. She does have a history of DM 2, last A1C 6.4%, 10/2023.  Review of Systems  Past Medical History:  Diagnosis Date   Allergy    Anemia    Arthritis    Diabetes mellitus without complication (HCC)    Enlarged heart    HFrEF (heart failure with reduced ejection fraction) (HCC)    Hypertension    LBBB (left bundle branch block)     Current Outpatient Medications  Medication Sig Dispense Refill   amLODipine  (NORVASC ) 10 MG tablet Take 1 tablet (10 mg total) by mouth daily. 90 tablet 1   bisacodyl  (DULCOLAX) 5 MG EC tablet Take 5 mg by mouth daily as needed for moderate constipation.     carvedilol  (COREG ) 25 MG tablet TAKE 1 TABLET BY MOUTH TWICE DAILY WITH MEALS (APPOINTMENT  REQUIRED  FOR  FUTURE  REFILLS) 60 tablet 0   empagliflozin  (JARDIANCE ) 25 MG TABS tablet Take 1 tablet (25 mg total) by mouth daily. 30 tablet 3   enalapril  (VASOTEC ) 20 MG tablet Take 1 tablet (20 mg total) by mouth 2 (two) times daily. 180 tablet 1   fenofibrate  54 MG tablet Take 1 tablet (54 mg total) by mouth daily. 90 tablet 1   ferrous sulfate 325 (65 FE) MG tablet Take 325 mg by mouth daily with breakfast.     furosemide  (LASIX ) 40 MG tablet Take 1 tablet (40 mg total) by mouth daily. 90 tablet 0   gabapentin  (NEURONTIN ) 300 MG capsule Take 1 capsule by mouth twice daily 180 capsule 1   glucose blood (RELION GLUCOSE TEST STRIPS) test strip Use as instructed 100 each 12   ibuprofen (ADVIL) 200 MG tablet Take 200 mg by mouth every 6 (six) hours as needed for moderate pain (pain score 4-6).     Insulin  Glargine (BASAGLAR   KWIKPEN) 100 UNIT/ML INJECT 32 UNITS SUBCUTANEOUSLY ONCE DAILY . APPOINTMENT REQUIRED FOR FUTURE REFILLS (Patient taking differently: Inject 11-12 Units into the skin daily.) 15 mL 2   ondansetron  (ZOFRAN ) 4 MG tablet Take 1 tablet (4 mg total) by mouth every 6 (six) hours as needed for nausea. 30 tablet 0   ReliOn Ultra Thin Lancets MISC 1 each by Does not apply route daily. 100 each 11   sitaGLIPtin  (JANUVIA ) 100 MG tablet Take 1 tablet by mouth once daily 30 tablet 3   tirzepatide  (MOUNJARO ) 5 MG/0.5ML Pen Inject 5 mg into the skin once a week. 6 mL 0   tiZANidine  (ZANAFLEX ) 4 MG tablet Take 1 tablet (4 mg total) by mouth every 8 (eight) hours as needed for muscle spasms. 90 tablet 1   ULTICARE MINI PEN NEEDLES 32G X 6 MM MISC 1 Bottle by Does not apply route as directed. 100 each 1   No current facility-administered medications for this visit.    Allergies  Allergen Reactions   Prednisone Swelling   Metformin  And Related Diarrhea and Nausea Only    Severe nausea and diarrhea even with extended release versions    Family History  Problem Relation Age of Onset  Diabetes Mother    Cancer Father    Mental illness Son     Social History   Socioeconomic History   Marital status: Divorced    Spouse name: Not on file   Number of children: 4   Years of education: Not on file   Highest education level: Not on file  Occupational History   Not on file  Tobacco Use   Smoking status: Never   Smokeless tobacco: Never  Vaping Use   Vaping status: Never Used  Substance and Sexual Activity   Alcohol use: No    Alcohol/week: 0.0 standard drinks of alcohol   Drug use: No   Sexual activity: Not on file  Other Topics Concern   Not on file  Social History Narrative   Not on file   Social Drivers of Health   Financial Resource Strain: Low Risk  (03/02/2024)   Received from [facility]   Overall Financial Resource Strain (CARDIA)    Difficulty of Paying Living  Expenses: Not hard at all  Food Insecurity: No Food Insecurity (03/02/2024)   Received from [facility]   Hunger Vital Sign    Within the past 12 months, you worried that your food would run out before you got the money to buy more.: Never true    Within the past 12 months, the food you bought just didn't last and you didn't have money to get more.: Never true  Transportation Needs: No Transportation Needs (03/02/2024)   Received from [facility] - Transportation    In the past 12 months, has lack of transportation kept you from medical appointments or from getting medications?: No    Lack of Transportation (Non-Medical): No  Physical Activity: Not on file  Stress: Not on file  Social Connections: Not on file  Intimate Partner Violence: Not on file     Constitutional: Denies fever, malaise, fatigue, headache or abrupt weight changes.  Respiratory: Denies difficulty breathing, shortness of breath, cough or sputum production.   Cardiovascular: Patient reports swelling in legs.  Denies chest pain, chest tightness, palpitations or swelling in the hands.  Gastrointestinal: Denies abdominal pain, bloating, constipation, diarrhea or blood in the stool.  GU: Denies urgency, frequency, pain with urination, burning sensation, blood in urine, odor or discharge. Musculoskeletal: Patient reports left knee pain and swelling.  Denies decrease in range of motion, difficulty with gait, muscle pain.  Skin: Denies redness, rashes, lesions or ulcercations.  Neurological: Patient reports neuropathic pain.  Denies dizziness, difficulty with memory, difficulty with speech or problems with balance and coordination.    No other specific complaints in a complete review of systems (except as listed in HPI above).     Objective:   Physical Exam  BP 118/70 (BP Location: Left Arm, Patient Position: Sitting, Cuff Size: Large)   Pulse 68   Ht 5' 2 (1.575 m)   Wt 220 lb  3.2 oz (99.9 kg)   SpO2 98%   BMI 40.28 kg/m     Wt Readings from Last 3 Encounters:  03/01/24 218 lb (98.9 kg)  01/26/24 220 lb (99.8 kg)  12/02/23 220 lb (99.8 kg)    General: Appears her stated age, obese, in NAD. Skin: Warm, dry and intact. No ulcerations noted. Cardiovascular: Normal rate and rhythm. S1,S2 noted.  No murmur, rubs or gallops noted. No JVD or BLE edema. No carotid bruits noted. Pulmonary/Chest: Normal effort and positive vesicular breath sounds. No respiratory  distress. No wheezes, rales or ronchi noted.  Musculoskeletal: Joint enlargement noted of bilateral knees.  Peripatellar effusion noted on the left.  Pain with palpation over the patella and the lateral joint line.  She has difficulty getting from a sitting to a standing position.  Gait slow and steady without device. Neurological: Alert and oriented. Coordination normal.    BMET    Component Value Date/Time   NA 138 03/01/2024 1038   NA 136 11/30/2015 1037   K 4.4 03/01/2024 1038   CL 106 03/01/2024 1038   CO2 23 03/01/2024 1038   GLUCOSE 101 (H) 03/01/2024 1038   BUN 19 03/01/2024 1038   BUN 19 11/30/2015 1037   CREATININE 0.69 03/01/2024 1038   CREATININE 0.67 11/27/2023 1457   CALCIUM  8.6 (L) 03/01/2024 1038   GFRNONAA >60 03/01/2024 1038   GFRNONAA 88 03/22/2019 0909   GFRAA 102 03/22/2019 0909    Lipid Panel     Component Value Date/Time   CHOL 207 (H) 11/27/2023 1457   CHOL 182 11/30/2015 1037   TRIG 158 (H) 11/27/2023 1457   HDL 55 11/27/2023 1457   HDL 47 11/30/2015 1037   CHOLHDL 3.8 11/27/2023 1457   VLDL 33 (H) 04/08/2017 0857   LDLCALC 125 (H) 11/27/2023 1457    CBC    Component Value Date/Time   WBC 12.0 (H) 03/01/2024 1038   RBC 4.75 03/01/2024 1038   HGB 12.8 03/01/2024 1038   HGB 12.2 11/30/2015 1037   HCT 39.5 03/01/2024 1038   HCT 38.0 11/30/2015 1037   PLT 429 (H) 03/01/2024 1038   PLT 428 (H) 11/30/2015 1037   MCV 83.2 03/01/2024 1038   MCV 83 11/30/2015  1037   MCH 26.9 03/01/2024 1038   MCHC 32.4 03/01/2024 1038   RDW 15.3 03/01/2024 1038   RDW 15.1 11/30/2015 1037   LYMPHSABS 3.5 03/01/2024 1038   LYMPHSABS 3.1 11/30/2015 1037   MONOABS 0.9 03/01/2024 1038   EOSABS 0.9 (H) 03/01/2024 1038   EOSABS 0.4 11/30/2015 1037   BASOSABS 0.1 03/01/2024 1038   BASOSABS 0.1 11/30/2015 1037    Hgb A1C Lab Results  Component Value Date   HGBA1C 6.4 (H) 11/27/2023            Assessment & Plan:   Preoperative clearance for osteoarthritis of left knee:  ECG and blood work has already been performed Clearance form will be faxed back to [facility]  RTC in 2 months for your annual exam Angeline Laura, NP

## 2024-03-10 ENCOUNTER — Encounter: Payer: Self-pay | Admitting: Orthopedic Surgery

## 2024-03-10 NOTE — Progress Notes (Addendum)
 Perioperative / Anesthesia Services  Pre-Admission Testing Clinical Review / Pre-Operative Anesthesia Consult  Date: 03/11/24  PATIENT DEMOGRAPHICS: Name: Stacy Martinez DOB: July 20, 1956 MRN:   [id number]  Note: Available PAT nursing documentation and vital signs have been reviewed. Clinical nursing staff has updated patient's PMH/PSHx, current medication list, and drug allergies/intolerances to ensure complete and comprehensive history available to assist care teams in MDM as it pertains to the aforementioned surgical procedure and anticipated anesthetic course. Extensive review of available clinical information personally performed. Clayton PMH and PSHx updated with any diagnoses/procedures that  may have been inadvertently omitted during his intake with the pre-admission testing department's nursing staff.  PLANNED SURGICAL PROCEDURE(S):   Case: [id number] Date/Time: 03/14/24 0715   Procedure: ARTHROPLASTY, KNEE, TOTAL (Left: Knee)   Anesthesia type: Choice   Diagnosis: Primary osteoarthritis of left knee [M17.12]   Pre-op diagnosis: Primary osteoarthritis of left knee M17.12   Location: [facility] OR ROOM 02 / [facility]   Surgeons: Lorelle Hussar, MD        CLINICAL DISCUSSION: FRANCESSCA Martinez is a 68 y.o. female who is submitted for pre-surgical anesthesia review and clearance prior to her undergoing the above procedure. Patient has never been a smoker in the past. Pertinent PMH includes: peripartum cardiomyopathy, HFrEF, LBBB, cardiomegaly, HTN, HLD, T2DM, anemia, OA, lumbar DDD.   Patient is followed by cardiology Jodeen, MD). She was last seen in the cardiology clinic on 03/09/2024; notes reviewed. At the time of her clinic visit, patient doing well overall from a cardiovascular perspective. Patient denied any chest pain, shortness of breath, PND, orthopnea, palpitations, significant peripheral edema, weakness, fatigue, vertiginous symptoms, or presyncope/syncope.  Patient with a past medical history significant for cardiovascular diagnoses. Documented physical exam was grossly benign, providing no evidence of acute exacerbation and/or decompensation of the patient's known cardiovascular conditions.  Patient with a history of peripartum cardiomyopathy with resulting HFrEF. Most recent TTE performed on 01/07/2016 revealed a normal left ventricular systolic function with an EF of >55%. There were no regional wall motion abnormalities. Left ventricular diastolic Doppler parameters consistent with abnormal relaxation (G1DD).left atrium mildly enlarged.  Right ventricular size and function normal. RVSP = 30.2 mmHg.  There was trivial to mild pan valvular regurgitation.  All transvalvular gradients were noted to be normal providing no evidence of hemodynamically significant valvular stenosis. Aorta normal in size with no evidence of ectasia or aneurysmal dilatation.  Most recent myocardial perfusion imaging study was performed on 03/25/2016 revealing a mildly reduced left ventricular systolic function with an EF of 48%.  There were no regional wall motion abnormalities.  No artifact or left ventricular cavity size enlargement appreciated on review of imaging. SPECT images demonstrated no evidence of stress-induced myocardial ischemia or arrhythmia; no scintigraphic evidence of scar.  TID ratio = 1.0.  Indeterminate treadmill ECG due to baseline ECG changes and LBBB.  Patient developed severe shortness of breath with peak exercise, however did not experience chest pain.  Study determined to be normal and low risk.  Blood pressure reasonably controlled at 03/09/2024 mmHg on currently prescribed CCB (amlodipine ), beta-blocker (carvedilol ), ACEi (enalapril ) and diuretic (furosemide ) therapies.  Patient is on fenofibrate  for her HLD diagnosis and ASCVD prevention. T2DM well controlled on currently prescribed regimen; last HgbA1c was 6.4% when checked on 11/19/2023. Patient does not  have an OSAH diagnosis. Patient is able to complete all of her  ADL/IADLs without cardiovascular limitation.  Per the DASI, patient is able to achieve at least  4 METS of physical activity without experiencing any significant degree of angina/anginal equivalent symptoms. No changes were made to her medication regimen during her visit with cardiology.  Patient scheduled to follow-up with outpatient cardiology in 1 year or sooner if needed.  Stacy Martinez is scheduled for an elective ARTHROPLASTY, KNEE, TOTAL (Left: Knee) on 03/14/2024 with Dr. Arthea Sheer, MD.  Given patient's past medical history significant for cardiovascular diagnoses, presurgical cardiac clearance was sought by the PAT team. Per cardiology, this patient is optimized for surgery and may proceed with the planned procedural course with a LOW risk of significant perioperative cardiovascular complications.  In review of her medication reconciliation, the patient is not noted to be taking any type of anticoagulation or antiplatelet therapies that would need to be held during her perioperative course.  Patient denies previous perioperative complications with anesthesia in the past. In review her EMR, it is noted that patient underwent a general anesthetic course here at [facility] ([facility] III) in 10/2021 without documented complications.   MOST RECENT VITAL SIGNS:    03/03/2024    1:41 PM 03/01/2024   10:08 AM 01/26/2024   11:30 AM  Vitals with BMI  Height 5' 2 5' 2 5' 2  Weight 220 lbs 3 oz 218 lbs 220 lbs  BMI 40.26 39.86 40.23  Systolic 118 126 897  Diastolic 70 69 61  Pulse 68 67 73   PROVIDERS/SPECIALISTS: NOTE: Primary physician provider listed below. Patient may have been seen by APP or partner within same practice.   PROVIDER ROLE / SPECIALTY LAST SHERLEAN Sheer Arthea, MD Orthopedics (Surgeon) 03/02/2024  Antonette Angeline ORN, NP Primary Care Provider 12/01/2023  Wilburn Fillers, MD  Cardiology 03/09/2024  Marcelino Nurse, MD Pain Management 01/26/2024   ALLERGIES: Allergies  Allergen Reactions   Prednisone Swelling   Metformin  And Related Diarrhea and Nausea Only    Severe nausea and diarrhea even with extended release versions    CURRENT HOME MEDICATIONS: No current facility-administered medications for this encounter.    amLODipine  (NORVASC ) 10 MG tablet   bisacodyl  (DULCOLAX) 5 MG EC tablet   carvedilol  (COREG ) 25 MG tablet   empagliflozin  (JARDIANCE ) 25 MG TABS tablet   enalapril  (VASOTEC ) 20 MG tablet   fenofibrate  54 MG tablet   ferrous sulfate 325 (65 FE) MG tablet   furosemide  (LASIX ) 40 MG tablet   gabapentin  (NEURONTIN ) 300 MG capsule   ibuprofen (ADVIL) 200 MG tablet   Insulin  Glargine (BASAGLAR  KWIKPEN) 100 UNIT/ML   ondansetron  (ZOFRAN ) 4 MG tablet   sitaGLIPtin  (JANUVIA ) 100 MG tablet   tirzepatide  (MOUNJARO ) 5 MG/0.5ML Pen   tiZANidine  (ZANAFLEX ) 4 MG tablet   diclofenac  (VOLTAREN ) 50 MG EC tablet   glucose blood (RELION GLUCOSE TEST STRIPS) test strip   ReliOn Ultra Thin Lancets MISC   ULTICARE MINI PEN NEEDLES 32G X 6 MM MISC   HISTORY: Past Medical History:  Diagnosis Date   Abdominal hernia s/p mesh repair with recurrence and second repair    Allergy    Anemia    Aortic atherosclerosis (HCC)    Arthritis    DDD (degenerative disc disease), lumbar    Enlarged heart    Hepatic steatosis    HFrEF (heart failure with reduced ejection fraction) (HCC)    HLD (hyperlipidemia)    Hypertension    LBBB (left bundle branch block)    Peripartum cardiomyopathy    T2DM (type 2 diabetes mellitus) (HCC)    Past Surgical  History:  Procedure Laterality Date   ABDOMINAL HERNIA REPAIR  2001   CESAREAN SECTION N/A 1999   CESAREAN SECTION N/A 2001   RECURRENT ABDOMINAL HERNIA REPAIR  2006   REVERSE SHOULDER ARTHROPLASTY Right 08/27/2020   Procedure: RIGHT REVERSE SHOULDER ARTHROPLASTY,BICEPS TENODESIS;  Surgeon: Tobie Priest, MD;  Location:  [facility] ORS;  Service: Orthopedics;  Laterality: Right;  GENERAL WITH BLOCK   TOTAL HIP ARTHROPLASTY Right 11/29/2021   Procedure: TOTAL HIP ARTHROPLASTY ANTERIOR APPROACH;  Surgeon: Kathlynn Sharper, MD;  Location: [facility] ORS;  Service: Orthopedics;  Laterality: Right;   WRIST FRACTURE SURGERY Right 2013   metal rod and screws   Family History  Problem Relation Age of Onset   Diabetes Mother    Cancer Father    Mental illness Son    Social History   Tobacco Use   Smoking status: Never   Smokeless tobacco: Never  Substance Use Topics   Alcohol use: No    Alcohol/week: 0.0 standard drinks of alcohol   LABS:  Hospital Outpatient Visit on 03/01/2024  Component Date Value Ref Range Status   WBC 03/01/2024 12.0 (H)  4.0 - 10.5 K/uL Final   RBC 03/01/2024 4.75  3.87 - 5.11 MIL/uL Final   Hemoglobin 03/01/2024 12.8  12.0 - 15.0 g/dL Final   HCT 92/98/7974 39.5  36.0 - 46.0 % Final   MCV 03/01/2024 83.2  80.0 - 100.0 fL Final   MCH 03/01/2024 26.9  26.0 - 34.0 pg Final   MCHC 03/01/2024 32.4  30.0 - 36.0 g/dL Final   RDW 92/98/7974 15.3  11.5 - 15.5 % Final   Platelets 03/01/2024 429 (H)  150 - 400 K/uL Final   nRBC 03/01/2024 0.0  0.0 - 0.2 % Final   Neutrophils Relative % 03/01/2024 54  % Final   Neutro Abs 03/01/2024 6.5  1.7 - 7.7 K/uL Final   Lymphocytes Relative 03/01/2024 29  % Final   Lymphs Abs 03/01/2024 3.5  0.7 - 4.0 K/uL Final   Monocytes Relative 03/01/2024 7  % Final   Monocytes Absolute 03/01/2024 0.9  0.1 - 1.0 K/uL Final   Eosinophils Relative 03/01/2024 8  % Final   Eosinophils Absolute 03/01/2024 0.9 (H)  0.0 - 0.5 K/uL Final   Basophils Relative 03/01/2024 1  % Final   Basophils Absolute 03/01/2024 0.1  0.0 - 0.1 K/uL Final   Immature Granulocytes 03/01/2024 1  % Final   Abs Immature Granulocytes 03/01/2024 0.07  0.00 - 0.07 K/uL Final   Performed at [facility], [address] [place], [place] [address]   Sodium 03/01/2024 138  135 - 145 mmol/L Final    Potassium 03/01/2024 4.4  3.5 - 5.1 mmol/L Final   Chloride 03/01/2024 106  98 - 111 mmol/L Final   CO2 03/01/2024 23  22 - 32 mmol/L Final   Glucose, Bld 03/01/2024 101 (H)  70 - 99 mg/dL Final   Glucose reference range applies only to samples taken after fasting for at least 8 hours.   BUN 03/01/2024 19  8 - 23 mg/dL Final   Creatinine, Ser 03/01/2024 0.69  0.44 - 1.00 mg/dL Final   Calcium  03/01/2024 8.6 (L)  8.9 - 10.3 mg/dL Final   Total Protein 92/98/7974 7.1  6.5 - 8.1 g/dL Final   Albumin 92/98/7974 3.6  3.5 - 5.0 g/dL Final   AST 92/98/7974 15  15 - 41 U/L Final   ALT 03/01/2024 14  0 - 44 U/L Final  Alkaline Phosphatase 03/01/2024 58  38 - 126 U/L Final   Total Bilirubin 03/01/2024 0.7  0.0 - 1.2 mg/dL Final   GFR, Estimated 03/01/2024 >60  >60 mL/min Final   Comment: (NOTE) Calculated using the CKD-EPI Creatinine Equation (2021)    Anion gap 03/01/2024 9  5 - 15 Final   Performed at [facility], [address] [place], [place] [address]   Color, Urine 03/01/2024 YELLOW (A)  YELLOW Final   APPearance 03/01/2024 HAZY (A)  CLEAR Final   Specific Gravity, Urine 03/01/2024 1.030  1.005 - 1.030 Final   pH 03/01/2024 5.0  5.0 - 8.0 Final   Glucose, UA 03/01/2024 >=500 (A)  NEGATIVE mg/dL Final   Hgb urine dipstick 03/01/2024 NEGATIVE  NEGATIVE Final   Bilirubin Urine 03/01/2024 NEGATIVE  NEGATIVE Final   Ketones, ur 03/01/2024 NEGATIVE  NEGATIVE mg/dL Final   Protein, ur 92/98/7974 NEGATIVE  NEGATIVE mg/dL Final   Nitrite 92/98/7974 NEGATIVE  NEGATIVE Final   Leukocytes,Ua 03/01/2024 NEGATIVE  NEGATIVE Final   RBC / HPF 03/01/2024 0-5  0 - 5 RBC/hpf Final   WBC, UA 03/01/2024 0-5  0 - 5 WBC/hpf Final   Bacteria, UA 03/01/2024 MANY (A)  NONE SEEN Final   Squamous Epithelial / HPF 03/01/2024 0-5  0 - 5 /HPF Final   Mucus 03/01/2024 PRESENT   Final   Performed at [facility], [address] [place], [place] [address]   MRSA, PCR 03/01/2024 NEGATIVE   NEGATIVE Final   Staphylococcus aureus 03/01/2024 NEGATIVE  NEGATIVE Final   Comment: (NOTE) The Xpert SA Assay (FDA approved for NASAL specimens in patients 64 years of age and older), is one component of a comprehensive surveillance program. It is not intended to diagnose infection nor to guide or monitor treatment. Performed at [facility], [address] [place], [place] [address]     ECG: Date: 03/01/2024  Time ECG obtained: 1115 AM Rate: 67 bpm Rhythm: normal sinus; LBBB Axis (leads I and aVF): normal Intervals: PR 164 ms. QRS 148 ms. QTc 475 ms. ST segment and T wave changes: No evidence of acute T wave abnormalities or significant ST segment elevation or depression.  Evidence of a possible, age undetermined, prior infarct:  No Comparison: Similar to previous tracing obtained on 11/15/2021   IMAGING / PROCEDURES: DIAGNOSTIC RADIOGRAPHS OF LEFT KNEE 4+ VIEWS performed on 01/26/2024 Severe degenerative changes with tricompartmental involvement.   There is medial patellofemoral bone-on-bone articulation with lateral subluxation of the tibia relative to the femur.   Osteophyte formation, sclerosis and subchondral cyst formation. Kellgren-Lawrence grade 4.   AP, sunrise, and flexed PA of the right knee shows similar tricompartmental degenerative changes with bone-on-bone articulation, osteophyte formation and sclerosis. Kellgren-Lawrence grade 4.   No fractures or dislocations noted in either knee.   MYOCARDIAL PERFUSION IMAGING STUDY (LEXISCAN) performed on 03/25/2016 Mildly reduced left ventricular systolic function with an EF of 48% No regional wall motion abnormalities SPECT images demonstrate homogenous tracer distribution throughout the myocardium No artifact Left ventricular cavity size normal Indeterminant treadmill ECG due to baseline ECG changes and LBBB Severe shortness of breath with peak exercise with no associated chest pain No evidence of  myocardial ischemia  TRANSTHORACIC ECHOCARDIOGRAM performed on 01/07/2016 Normal left ventricular systolic function with an EF of >55% No regional wall motion abnormalities No LVH Left ventricular diastolic Doppler parameters consistent with abnormal relaxation (G1DD). Left atrium mildly enlarged Normal right ventricular size and function RVSP = 30.2 mmHg  Trivial MR and TR Mild AR and TR Normal gradients; no valvular stenosis   IMPRESSION AND PLAN: Loral Campi Howley has been referred for pre-anesthesia review and clearance prior to her undergoing the planned anesthetic and procedural courses. Available labs, pertinent testing, and imaging results were personally reviewed by me in preparation for upcoming operative/procedural course. [facility] medical record has been updated following extensive record review and patient interview with PAT staff.   This patient has been appropriately cleared by cardiology with an overall LOW risk of patient experiencing significant perioperative cardiovascular complications. Based on clinical review performed today (03/11/24), barring any significant acute changes in the patient's overall condition, it is anticipated that she will be able to proceed with the planned surgical intervention. Any acute changes in clinical condition may necessitate her procedure being postponed and/or cancelled. Patient will meet with anesthesia team (MD and/or CRNA) on the day of her procedure for preoperative evaluation/assessment. Questions regarding anesthetic course will be fielded at that time.   Pre-surgical instructions were reviewed with the patient during his PAT appointment, and questions were fielded to satisfaction by PAT clinical staff. She has been instructed on which medications that she will need to hold prior to surgery, as well as the ones that have been deemed safe/appropriate to take on the day of her procedure. As part of the general education provided by PAT, patient  made aware both verbally and in writing, that she would need to abstain from the use of any illegal substances during her perioperative course. She was advised that failure to follow the provided instructions could necessitate case cancellation or result in serious perioperative complications up to and including death. Patient encouraged to contact PAT and/or her surgeon's office to discuss any questions or concerns that may arise prior to surgery; verbalized understanding.   Dorise Pereyra, MSN, APRN, FNP-C, CEN [facility]  Perioperative Services Nurse Practitioner Phone: [phone] Fax: [phone] 03/11/24 1:05 PM  NOTE: This note has been prepared using Dragon dictation software. Despite my best ability to proofread, there is always the potential that unintentional transcriptional errors may still occur from this process.

## 2024-03-14 ENCOUNTER — Ambulatory Visit: Payer: Self-pay | Admitting: Urgent Care

## 2024-03-14 ENCOUNTER — Ambulatory Visit

## 2024-03-14 ENCOUNTER — Other Ambulatory Visit: Payer: Self-pay

## 2024-03-14 ENCOUNTER — Ambulatory Visit
Admission: RE | Admit: 2024-03-14 | Discharge: 2024-03-15 | Disposition: A | Discharge status: Home Health | Attending: Orthopedic Surgery | Admitting: Orthopedic Surgery

## 2024-03-14 ENCOUNTER — Encounter
Admission: RE | Disposition: A | Discharge status: Home Health | Payer: Self-pay | Source: Home / Self Care | Attending: Orthopedic Surgery

## 2024-03-14 ENCOUNTER — Encounter: Payer: Self-pay | Admitting: Orthopedic Surgery

## 2024-03-14 DIAGNOSIS — Z96652 Presence of left artificial knee joint: Secondary | ICD-10-CM

## 2024-03-14 DIAGNOSIS — I110 Hypertensive heart disease with heart failure: Secondary | ICD-10-CM | POA: Insufficient documentation

## 2024-03-14 DIAGNOSIS — Z7984 Long term (current) use of oral hypoglycemic drugs: Secondary | ICD-10-CM | POA: Diagnosis not present

## 2024-03-14 DIAGNOSIS — Z7985 Long-term (current) use of injectable non-insulin antidiabetic drugs: Secondary | ICD-10-CM | POA: Diagnosis not present

## 2024-03-14 DIAGNOSIS — Z6841 Body Mass Index (BMI) 40.0 and over, adult: Secondary | ICD-10-CM | POA: Insufficient documentation

## 2024-03-14 DIAGNOSIS — Z794 Long term (current) use of insulin: Secondary | ICD-10-CM | POA: Diagnosis not present

## 2024-03-14 DIAGNOSIS — E669 Obesity, unspecified: Secondary | ICD-10-CM | POA: Insufficient documentation

## 2024-03-14 DIAGNOSIS — I5022 Chronic systolic (congestive) heart failure: Secondary | ICD-10-CM | POA: Diagnosis not present

## 2024-03-14 DIAGNOSIS — E1142 Type 2 diabetes mellitus with diabetic polyneuropathy: Secondary | ICD-10-CM | POA: Insufficient documentation

## 2024-03-14 DIAGNOSIS — M1712 Unilateral primary osteoarthritis, left knee: Secondary | ICD-10-CM | POA: Diagnosis not present

## 2024-03-14 HISTORY — DX: Unspecified abdominal hernia without obstruction or gangrene: K46.9

## 2024-03-14 HISTORY — DX: Peripartum cardiomyopathy: O90.3

## 2024-03-14 HISTORY — DX: Left bundle-branch block, unspecified: I44.7

## 2024-03-14 HISTORY — DX: Type 2 diabetes mellitus without complications: E11.9

## 2024-03-14 HISTORY — DX: Atherosclerosis of aorta: I70.0

## 2024-03-14 HISTORY — DX: Hyperlipidemia, unspecified: E78.5

## 2024-03-14 HISTORY — DX: Fatty (change of) liver, not elsewhere classified: K76.0

## 2024-03-14 HISTORY — PX: TOTAL KNEE ARTHROPLASTY: SHX125

## 2024-03-14 HISTORY — DX: Other intervertebral disc degeneration, lumbar region without mention of lumbar back pain or lower extremity pain: M51.369

## 2024-03-14 HISTORY — DX: Unspecified systolic (congestive) heart failure: I50.20

## 2024-03-14 LAB — GLUCOSE, CAPILLARY
Glucose-Capillary: 165 mg/dL — ABNORMAL HIGH (ref 70–99)
Glucose-Capillary: 139 mg/dL — ABNORMAL HIGH (ref 70–99)
Glucose-Capillary: 151 mg/dL — ABNORMAL HIGH (ref 70–99)
Glucose-Capillary: 135 mg/dL — ABNORMAL HIGH (ref 70–99)

## 2024-03-14 SURGERY — ARTHROPLASTY, KNEE, TOTAL
Anesthesia: Spinal | Site: Knee | Laterality: Left

## 2024-03-14 MED ORDER — INSULIN ASPART 100 UNIT/ML IJ SOLN: 0.0000 [IU] | Freq: Every day | INTRAMUSCULAR | Status: DC

## 2024-03-14 MED ORDER — CHLORHEXIDINE GLUCONATE 0.12 % MT SOLN
OROMUCOSAL | Status: AC
Start: 2024-03-14 — End: 2024-03-14
  Filled 2024-03-14: qty 15

## 2024-03-14 MED ORDER — ACETAMINOPHEN 10 MG/ML IV SOLN
INTRAVENOUS | Status: AC
  Filled 2024-03-14: qty 100

## 2024-03-14 MED ORDER — METOCLOPRAMIDE HCL 5 MG PO TABS
5.0000 mg | ORAL_TABLET | Freq: Three times a day (TID) | ORAL | Status: DC | PRN
  Filled 2024-03-14: qty 2

## 2024-03-14 MED ORDER — PANTOPRAZOLE SODIUM 40 MG PO TBEC
40.0000 mg | DELAYED_RELEASE_TABLET | Freq: Every day | ORAL | Status: DC
  Administered 2024-03-14 – 2024-03-15 (×2): 40 mg via ORAL
  Filled 2024-03-14 (×2): qty 1

## 2024-03-14 MED ORDER — INSULIN ASPART 100 UNIT/ML IJ SOLN
0.0000 [IU] | Freq: Three times a day (TID) | INTRAMUSCULAR | Status: DC
  Filled 2024-03-14: qty 1

## 2024-03-14 MED ORDER — TRANEXAMIC ACID-NACL 1000-0.7 MG/100ML-% IV SOLN
INTRAVENOUS | Status: AC
  Filled 2024-03-14: qty 100

## 2024-03-14 MED ORDER — CEFAZOLIN SODIUM-DEXTROSE 2-4 GM/100ML-% IV SOLN
2.0000 g | INTRAVENOUS | Status: AC
  Administered 2024-03-14: 2 g via INTRAVENOUS

## 2024-03-14 MED ORDER — KETOROLAC TROMETHAMINE 30 MG/ML IJ SOLN
INTRAMUSCULAR | Status: AC
  Filled 2024-03-14: qty 1

## 2024-03-14 MED ORDER — KETAMINE HCL 50 MG/5ML IJ SOSY
PREFILLED_SYRINGE | INTRAMUSCULAR | Status: AC
Start: 2024-03-14 — End: 2024-03-14
  Filled 2024-03-14: qty 5

## 2024-03-14 MED ORDER — BUPIVACAINE-EPINEPHRINE (PF) 0.25% -1:200000 IJ SOLN
INTRAMUSCULAR | Status: AC
  Filled 2024-03-14: qty 30

## 2024-03-14 MED ORDER — CARVEDILOL 25 MG PO TABS
25.0000 mg | ORAL_TABLET | Freq: Two times a day (BID) | ORAL | Status: DC
  Administered 2024-03-15: 25 mg via ORAL
  Filled 2024-03-14: qty 1

## 2024-03-14 MED ORDER — TRANEXAMIC ACID-NACL 1000-0.7 MG/100ML-% IV SOLN
1000.0000 mg | INTRAVENOUS | Status: AC
  Administered 2024-03-14 (×2): 1000 mg via INTRAVENOUS

## 2024-03-14 MED ORDER — ONDANSETRON HCL 4 MG PO TABS
4.0000 mg | ORAL_TABLET | Freq: Four times a day (QID) | ORAL | Status: DC | PRN
  Filled 2024-03-14: qty 1

## 2024-03-14 MED ORDER — FENOFIBRATE 54 MG PO TABS
54.0000 mg | ORAL_TABLET | Freq: Every day | ORAL | Status: DC
  Administered 2024-03-15: 54 mg via ORAL
  Filled 2024-03-14: qty 1

## 2024-03-14 MED ORDER — EPHEDRINE SULFATE-NACL 50-0.9 MG/10ML-% IV SOSY
PREFILLED_SYRINGE | INTRAVENOUS | Status: DC | PRN
  Administered 2024-03-14: 10 mg via INTRAVENOUS
  Administered 2024-03-14: 5 mg via INTRAVENOUS
  Administered 2024-03-14 (×3): 10 mg via INTRAVENOUS

## 2024-03-14 MED ORDER — DEXAMETHASONE SODIUM PHOSPHATE 10 MG/ML IJ SOLN
INTRAMUSCULAR | Status: AC
  Filled 2024-03-14: qty 1

## 2024-03-14 MED ORDER — MORPHINE SULFATE (PF) 4 MG/ML IV SOLN: 0.5000 mg | INTRAVENOUS | Status: DC | PRN

## 2024-03-14 MED ORDER — ACETAMINOPHEN 500 MG PO TABS: 1000.0000 mg | ORAL_TABLET | Freq: Three times a day (TID) | ORAL | 0 refills | Status: AC

## 2024-03-14 MED ORDER — SODIUM CHLORIDE 0.9 % IV SOLN: INTRAVENOUS | Status: DC

## 2024-03-14 MED ORDER — PHENYLEPHRINE HCL-NACL 20-0.9 MG/250ML-% IV SOLN
INTRAVENOUS | Status: AC
  Filled 2024-03-14: qty 250

## 2024-03-14 MED ORDER — EPHEDRINE 5 MG/ML INJ
INTRAVENOUS | Status: AC
  Filled 2024-03-14: qty 5

## 2024-03-14 MED ORDER — KETOROLAC TROMETHAMINE 15 MG/ML IJ SOLN
7.5000 mg | Freq: Four times a day (QID) | INTRAMUSCULAR | Status: AC
  Administered 2024-03-14 – 2024-03-15 (×4): 7.5 mg via INTRAVENOUS
  Filled 2024-03-14 (×4): qty 1

## 2024-03-14 MED ORDER — MENTHOL 3 MG MT LOZG: 1.0000 | LOZENGE | OROMUCOSAL | Status: DC | PRN

## 2024-03-14 MED ORDER — SURGIPHOR WOUND IRRIGATION SYSTEM - OPTIME
TOPICAL | Status: DC | PRN
Start: 2024-03-14 — End: 2024-03-14
  Administered 2024-03-14: 450 mL via TOPICAL

## 2024-03-14 MED ORDER — PROPOFOL 1000 MG/100ML IV EMUL
INTRAVENOUS | Status: AC
  Filled 2024-03-14: qty 100

## 2024-03-14 MED ORDER — ENOXAPARIN SODIUM 30 MG/0.3ML IJ SOSY
30.0000 mg | PREFILLED_SYRINGE | Freq: Two times a day (BID) | INTRAMUSCULAR | Status: DC
  Administered 2024-03-15: 30 mg via SUBCUTANEOUS
  Filled 2024-03-14: qty 0.3

## 2024-03-14 MED ORDER — DOCUSATE SODIUM 100 MG PO CAPS: 100.0000 mg | ORAL_CAPSULE | Freq: Two times a day (BID) | ORAL | 0 refills | Status: AC

## 2024-03-14 MED ORDER — FUROSEMIDE 20 MG PO TABS
40.0000 mg | ORAL_TABLET | Freq: Every day | ORAL | Status: DC
  Filled 2024-03-14 (×2): qty 2

## 2024-03-14 MED ORDER — TRAMADOL HCL 50 MG PO TABS: 50.0000 mg | ORAL_TABLET | Freq: Four times a day (QID) | ORAL | 0 refills | Status: DC | PRN

## 2024-03-14 MED ORDER — MIDAZOLAM HCL 2 MG/2ML IJ SOLN
INTRAMUSCULAR | Status: AC
  Filled 2024-03-14: qty 2

## 2024-03-14 MED ORDER — INSULIN GLARGINE-YFGN 100 UNIT/ML ~~LOC~~ SOLN
10.0000 [IU] | Freq: Every day | SUBCUTANEOUS | Status: DC
  Filled 2024-03-14 (×2): qty 0.1

## 2024-03-14 MED ORDER — CEFAZOLIN SODIUM-DEXTROSE 2-4 GM/100ML-% IV SOLN
2.0000 g | Freq: Four times a day (QID) | INTRAVENOUS | Status: AC
  Administered 2024-03-14 (×2): 2 g via INTRAVENOUS
  Filled 2024-03-14 (×2): qty 100

## 2024-03-14 MED ORDER — SODIUM CHLORIDE (PF) 0.9 % IJ SOLN
INTRAMUSCULAR | Status: DC | PRN
  Administered 2024-03-14: 71 mL via INTRAMUSCULAR

## 2024-03-14 MED ORDER — BUPIVACAINE HCL (PF) 0.5 % IJ SOLN
INTRAMUSCULAR | Status: DC | PRN
  Administered 2024-03-14: 2.5 mL via INTRATHECAL

## 2024-03-14 MED ORDER — ENOXAPARIN SODIUM 40 MG/0.4ML IJ SOSY: 40.0000 mg | PREFILLED_SYRINGE | INTRAMUSCULAR | 0 refills | Status: DC

## 2024-03-14 MED ORDER — CEFAZOLIN SODIUM-DEXTROSE 2-4 GM/100ML-% IV SOLN
INTRAVENOUS | Status: AC
  Filled 2024-03-14: qty 100

## 2024-03-14 MED ORDER — TRAMADOL HCL 50 MG PO TABS: 50.0000 mg | ORAL_TABLET | Freq: Four times a day (QID) | ORAL | Status: DC | PRN

## 2024-03-14 MED ORDER — PROPOFOL 10 MG/ML IV BOLUS
INTRAVENOUS | Status: DC | PRN
Start: 2024-03-14 — End: 2024-03-14
  Administered 2024-03-14: 20 mg via INTRAVENOUS
  Administered 2024-03-14: 75 ug/kg/min via INTRAVENOUS

## 2024-03-14 MED ORDER — TRANEXAMIC ACID-NACL 1000-0.7 MG/100ML-% IV SOLN
INTRAVENOUS | Status: AC
Start: 2024-03-14 — End: 2024-03-14
  Filled 2024-03-14: qty 100

## 2024-03-14 MED ORDER — ACETAMINOPHEN 500 MG PO TABS
1000.0000 mg | ORAL_TABLET | Freq: Three times a day (TID) | ORAL | Status: DC
  Administered 2024-03-14 – 2024-03-15 (×2): 1000 mg via ORAL
  Filled 2024-03-14 (×3): qty 2

## 2024-03-14 MED ORDER — CHLORHEXIDINE GLUCONATE 0.12 % MT SOLN
15.0000 mL | Freq: Once | OROMUCOSAL | Status: AC
  Administered 2024-03-14: 15 mL via OROMUCOSAL

## 2024-03-14 MED ORDER — OXYCODONE HCL 5 MG/5ML PO SOLN: 5.0000 mg | Freq: Once | ORAL | Status: DC | PRN

## 2024-03-14 MED ORDER — ONDANSETRON HCL 4 MG/2ML IJ SOLN
INTRAMUSCULAR | Status: DC | PRN
  Administered 2024-03-14: 4 mg via INTRAVENOUS

## 2024-03-14 MED ORDER — ACETAMINOPHEN 325 MG PO TABS: 325.0000 mg | ORAL_TABLET | Freq: Four times a day (QID) | ORAL | Status: DC | PRN

## 2024-03-14 MED ORDER — PHENOL 1.4 % MT LIQD: 1.0000 | OROMUCOSAL | Status: DC | PRN

## 2024-03-14 MED ORDER — ENALAPRIL MALEATE 20 MG PO TABS
20.0000 mg | ORAL_TABLET | Freq: Two times a day (BID) | ORAL | Status: DC
  Filled 2024-03-14: qty 1

## 2024-03-14 MED ORDER — LACTATED RINGERS IV SOLN: INTRAVENOUS | Status: DC

## 2024-03-14 MED ORDER — PROPOFOL 10 MG/ML IV BOLUS
INTRAVENOUS | Status: AC
  Filled 2024-03-14: qty 20

## 2024-03-14 MED ORDER — CELECOXIB 200 MG PO CAPS: 200.0000 mg | ORAL_CAPSULE | Freq: Two times a day (BID) | ORAL | 0 refills | Status: AC

## 2024-03-14 MED ORDER — GABAPENTIN 100 MG PO CAPS
300.0000 mg | ORAL_CAPSULE | Freq: Two times a day (BID) | ORAL | Status: DC
  Administered 2024-03-14 – 2024-03-15 (×2): 300 mg via ORAL
  Filled 2024-03-14: qty 3

## 2024-03-14 MED ORDER — AMLODIPINE BESYLATE 10 MG PO TABS
10.0000 mg | ORAL_TABLET | Freq: Every day | ORAL | Status: DC
  Filled 2024-03-14: qty 1

## 2024-03-14 MED ORDER — MIDAZOLAM HCL 5 MG/5ML IJ SOLN
INTRAMUSCULAR | Status: DC | PRN
  Administered 2024-03-14: 2 mg via INTRAVENOUS

## 2024-03-14 MED ORDER — ONDANSETRON HCL 4 MG/2ML IJ SOLN: 4.0000 mg | Freq: Four times a day (QID) | INTRAMUSCULAR | Status: DC | PRN

## 2024-03-14 MED ORDER — FENTANYL CITRATE (PF) 100 MCG/2ML IJ SOLN: 25.0000 ug | INTRAMUSCULAR | Status: DC | PRN

## 2024-03-14 MED ORDER — ONDANSETRON HCL 4 MG PO TABS: 4.0000 mg | ORAL_TABLET | Freq: Four times a day (QID) | ORAL | 0 refills | Status: AC | PRN

## 2024-03-14 MED ORDER — ACETAMINOPHEN 10 MG/ML IV SOLN: 1000.0000 mg | Freq: Once | INTRAVENOUS | Status: DC | PRN

## 2024-03-14 MED ORDER — DOCUSATE SODIUM 100 MG PO CAPS
100.0000 mg | ORAL_CAPSULE | Freq: Two times a day (BID) | ORAL | Status: DC
  Administered 2024-03-14 – 2024-03-15 (×2): 100 mg via ORAL
  Filled 2024-03-14 (×2): qty 1

## 2024-03-14 MED ORDER — ACETAMINOPHEN 10 MG/ML IV SOLN
INTRAVENOUS | Status: DC | PRN
  Administered 2024-03-14: 1000 mg via INTRAVENOUS

## 2024-03-14 MED ORDER — HYDROCODONE-ACETAMINOPHEN 5-325 MG PO TABS
1.0000 | ORAL_TABLET | ORAL | Status: DC | PRN
  Administered 2024-03-14: 2 via ORAL
  Filled 2024-03-14: qty 2

## 2024-03-14 MED ORDER — SODIUM CHLORIDE 0.9 % IR SOLN
Status: DC | PRN
  Administered 2024-03-14: 3000 mL

## 2024-03-14 MED ORDER — ORAL CARE MOUTH RINSE: 15.0000 mL | Freq: Once | OROMUCOSAL | Status: AC

## 2024-03-14 MED ORDER — DEXAMETHASONE SODIUM PHOSPHATE 10 MG/ML IJ SOLN
8.0000 mg | Freq: Once | INTRAMUSCULAR | Status: AC
  Administered 2024-03-14: 10 mg via INTRAVENOUS

## 2024-03-14 MED ORDER — INSULIN GLARGINE-YFGN 100 UNIT/ML ~~LOC~~ SOPN: 10.0000 [IU] | PEN_INJECTOR | Freq: Every day | SUBCUTANEOUS | Status: DC

## 2024-03-14 MED ORDER — KETAMINE HCL 50 MG/5ML IJ SOSY
PREFILLED_SYRINGE | INTRAMUSCULAR | Status: DC | PRN
  Administered 2024-03-14: 10 mg via INTRAVENOUS
  Administered 2024-03-14: 20 mg via INTRAVENOUS

## 2024-03-14 MED ORDER — OXYCODONE HCL 5 MG PO TABS: 5.0000 mg | ORAL_TABLET | Freq: Once | ORAL | Status: DC | PRN

## 2024-03-14 MED ORDER — ONDANSETRON HCL 4 MG/2ML IJ SOLN
INTRAMUSCULAR | Status: AC
  Filled 2024-03-14: qty 2

## 2024-03-14 MED ORDER — BUPIVACAINE LIPOSOME 1.3 % IJ SUSP
INTRAMUSCULAR | Status: AC
  Filled 2024-03-14: qty 20

## 2024-03-14 MED ORDER — OXYCODONE HCL 5 MG PO TABS: 2.5000 mg | ORAL_TABLET | Freq: Three times a day (TID) | ORAL | 0 refills | Status: DC | PRN

## 2024-03-14 MED ORDER — PHENYLEPHRINE HCL-NACL 20-0.9 MG/250ML-% IV SOLN
INTRAVENOUS | Status: DC | PRN
  Administered 2024-03-14: 30 ug/min via INTRAVENOUS

## 2024-03-14 MED ORDER — SODIUM CHLORIDE (PF) 0.9 % IJ SOLN
INTRAMUSCULAR | Status: AC
  Filled 2024-03-14: qty 20

## 2024-03-14 MED ORDER — BUPIVACAINE HCL (PF) 0.5 % IJ SOLN
INTRAMUSCULAR | Status: AC
  Filled 2024-03-14: qty 10

## 2024-03-14 MED ORDER — ONDANSETRON HCL 4 MG/2ML IJ SOLN: 4.0000 mg | Freq: Once | INTRAMUSCULAR | Status: DC | PRN

## 2024-03-14 MED ORDER — METOCLOPRAMIDE HCL 5 MG/ML IJ SOLN: 5.0000 mg | Freq: Three times a day (TID) | INTRAMUSCULAR | Status: DC | PRN

## 2024-03-14 SURGICAL SUPPLY — 57 items
BLADE PATELLA REAM PILOT HOLE (MISCELLANEOUS) IMPLANT
BLADE SAW 90X13X1.19 OSCILLAT (BLADE) IMPLANT
BLADE SAW SAG 25X90X1.19 (BLADE) ×1 IMPLANT
BLADE SAW SAG 29X58X.64 (BLADE) ×1 IMPLANT
BNDG ELASTIC 6INX 5YD STR LF (GAUZE/BANDAGES/DRESSINGS) ×1 IMPLANT
BOWL CEMENT MIX W/ADAPTER (MISCELLANEOUS) IMPLANT
BRUSH SCRUB EZ PLAIN DRY (MISCELLANEOUS) IMPLANT
CHLORAPREP W/TINT 26 (MISCELLANEOUS) ×2 IMPLANT
COMPONENT FEM KNEE PS STD 6 LT (Joint) IMPLANT
COMPONENT PATELLA PEG 3 32 (Joint) IMPLANT
COOLER ICEMAN CLASSIC (MISCELLANEOUS) ×1 IMPLANT
CUFF TRNQT CYL 24X4X16.5-23 (TOURNIQUET CUFF) IMPLANT
CUFF TRNQT CYL 30X4X21-28X (TOURNIQUET CUFF) IMPLANT
DERMABOND ADVANCED .7 DNX12 (GAUZE/BANDAGES/DRESSINGS) ×1 IMPLANT
DRAPE INCISE IOBAN 66X45 STRL (DRAPES) IMPLANT
DRAPE SHEET LG 3/4 BI-LAMINATE (DRAPES) ×2 IMPLANT
DRSG MEPILEX SACRM 8.7X9.8 (GAUZE/BANDAGES/DRESSINGS) ×1 IMPLANT
DRSG OPSITE POSTOP 4X8 (GAUZE/BANDAGES/DRESSINGS) IMPLANT
ELECTRODE REM PT RTRN 9FT ADLT (ELECTROSURGICAL) ×1 IMPLANT
GLOVE BIO SURGEON STRL SZ8 (GLOVE) ×1 IMPLANT
GLOVE BIOGEL PI IND STRL 8 (GLOVE) ×1 IMPLANT
GLOVE PI ORTHO PRO STRL 7.5 (GLOVE) ×2 IMPLANT
GLOVE PI ORTHO PRO STRL SZ8 (GLOVE) ×2 IMPLANT
GLOVE SURG SYN 7.5 PF PI (GLOVE) ×1 IMPLANT
GOWN SRG XL LVL 3 NONREINFORCE (GOWNS) ×1 IMPLANT
GOWN STRL REUS W/ TWL LRG LVL3 (GOWN DISPOSABLE) ×1 IMPLANT
GOWN STRL REUS W/ TWL XL LVL3 (GOWN DISPOSABLE) ×1 IMPLANT
HOOD PEEL AWAY T7 (MISCELLANEOUS) ×2 IMPLANT
KIT TURNOVER KIT A (KITS) ×1 IMPLANT
MANIFOLD NEPTUNE II (INSTRUMENTS) ×1 IMPLANT
MARKER SKIN DUAL TIP RULER LAB (MISCELLANEOUS) ×1 IMPLANT
MAT ABSORB FLUID 56X50 GRAY (MISCELLANEOUS) ×1 IMPLANT
NDL HYPO 21X1.5 SAFETY (NEEDLE) ×1 IMPLANT
NEEDLE HYPO 21X1.5 SAFETY (NEEDLE) ×1 IMPLANT
PACK TOTAL KNEE (MISCELLANEOUS) ×1 IMPLANT
PAD ARMBOARD POSITIONER FOAM (MISCELLANEOUS) ×3 IMPLANT
PAD COLD UNI WRAP-ON (PAD) ×1 IMPLANT
PENCIL SMOKE EVACUATOR (MISCELLANEOUS) ×1 IMPLANT
PIN DRILL HDLS TROCAR 75 4PK (PIN) IMPLANT
SCREW FEMALE HEX FIX 25X2.5 (ORTHOPEDIC DISPOSABLE SUPPLIES) IMPLANT
SCREW HEX HEADED 3.5X27 DISP (ORTHOPEDIC DISPOSABLE SUPPLIES) IMPLANT
SLEEVE SCD COMPRESS KNEE MED (STOCKING) ×1 IMPLANT
SOL .9 NS 3000ML IRR UROMATIC (IV SOLUTION) ×1 IMPLANT
SOLUTION IRRIG SURGIPHOR (IV SOLUTION) ×1 IMPLANT
STEM TIB PERS SZ E 5D LT (Screw) IMPLANT
STEM TIBIAL SZ6-7 EF 14 LT (Joint) IMPLANT
STOCKINETTE IMPERV 14X48 (MISCELLANEOUS) ×1 IMPLANT
SUT STRATAFIX 14 PDO 36 VLT (SUTURE) ×1 IMPLANT
SUT VIC AB 0 CT1 36 (SUTURE) ×1 IMPLANT
SUT VIC AB 2-0 CT2 27 (SUTURE) ×2 IMPLANT
SUTURE STRATA SPIR 4-0 18 (SUTURE) ×1 IMPLANT
SUTURE VICRYL 1-0 27IN ABS (SUTURE) ×1 IMPLANT
SYR 20ML LL LF (SYRINGE) ×2 IMPLANT
TAPE CLOTH 3X10 WHT NS LF (GAUZE/BANDAGES/DRESSINGS) ×1 IMPLANT
TIP FAN IRRIG PULSAVAC PLUS (DISPOSABLE) ×1 IMPLANT
TRAP FLUID SMOKE EVACUATOR (MISCELLANEOUS) ×1 IMPLANT
WATER STERILE IRR 1000ML POUR (IV SOLUTION) ×1 IMPLANT

## 2024-03-14 NOTE — Discharge Instructions (Signed)

## 2024-03-14 NOTE — Anesthesia Procedure Notes (Signed)
 Procedure Name: MAC Date/Time: 03/14/2024 7:34 AM  Performed by: Lorrene Camelia LABOR, CRNAPre-anesthesia Checklist: Emergency Drugs available, Patient identified, Suction available and Patient being monitored Patient Re-evaluated:Patient Re-evaluated prior to induction Oxygen Delivery Method: Simple face mask Preoxygenation: Pre-oxygenation with 100% oxygen Induction Type: IV induction

## 2024-03-14 NOTE — Interval H&P Note (Signed)
 Patient history and physical updated. Consent reviewed including risks, benefits, and alternatives to surgery. Patient agrees with above plan to proceed with left total knee arthroplasty.

## 2024-03-14 NOTE — Plan of Care (Signed)

## 2024-03-14 NOTE — Anesthesia Postprocedure Evaluation (Signed)
 Anesthesia Post Note  Patient: Stacy Martinez  Procedure(s) Performed: ARTHROPLASTY, KNEE, TOTAL (Left: Knee)  Patient location during evaluation: PACU Anesthesia Type: Spinal Level of consciousness: awake and alert Pain management: pain level controlled Vital Signs Assessment: post-procedure vital signs reviewed and stable Respiratory status: spontaneous breathing and respiratory function stable Cardiovascular status: blood pressure returned to baseline and stable Postop Assessment: spinal receding, no headache, no backache, no apparent nausea or vomiting and adequate PO intake Anesthetic complications: no   No notable events documented.   Last Vitals:  Vitals:   03/14/24 1030 03/14/24 1045  BP: (!) 101/47 (!) 99/48  Pulse: 66 71  Resp: 18 14  Temp:    SpO2: 95% 98%    Last Pain:  Vitals:   03/14/24 1030  PainSc: 0-No pain                 Alfonso Ruths

## 2024-03-14 NOTE — Evaluation (Signed)
 Physical Therapy Evaluation Patient Details Name: Stacy Martinez MRN: [id number] DOB: 08-05-1956 Today's Date: 03/14/2024  History of Present Illness  Pt is a 68 yo female s/p L TKA. PMH of HTN, CHF, DM.  Clinical Impression  Pt A&Ox4, reported 2/10 L knee. At baseline the pt lives with her son, works full time, independent. Will be staying with her daughter at discharge.   Pt up in recliner at start/end of session. Able to transfer with supervision, cued for hand placement and RW positioning. She ambulated ~69ft with RW, progressed to supervision with time. Educated on step through gait pattern, upright posture, step length. Able to perform a few seated therapeutic exercises as well.  Overall the patient demonstrated deficits (see PT Problem List) that impede the patient's functional abilities, safety, and mobility and would benefit from skilled PT intervention.          If plan is discharge home, recommend the following: Assistance with cooking/housework;Help with stairs or ramp for entrance;Direct supervision/assist for medications management;A little help with bathing/dressing/bathroom   Can travel by private vehicle        Equipment Recommendations  [facility]?)  Recommendations for Other Services       Functional Status Assessment Patient has had a recent decline in their functional status and demonstrates the ability to make significant improvements in function in a reasonable and predictable amount of time.     Precautions / Restrictions Precautions Precautions: Fall Recall of Precautions/Restrictions: Intact Restrictions Weight Bearing Restrictions Per Provider Order: Yes LLE Weight Bearing Per Provider Order: Weight bearing as tolerated      Mobility  Bed Mobility               General bed mobility comments: pt up in recliner at start/end of session    Transfers Overall transfer level: Needs assistance Equipment used: Rolling walker (2 wheels) Transfers: Sit  to/from Stand Sit to Stand: Supervision                Ambulation/Gait Ambulation/Gait assistance: Contact guard assist, Supervision Gait Distance (Feet): 65 Feet Assistive device: Rolling walker (2 wheels)   Gait velocity: decreased     General Gait Details: step to, able to progress to nearly step through with tactile cues, verbal cues. cues for upright posture, RW positioning  Stairs            Wheelchair Mobility     Tilt Bed    Modified Rankin (Stroke Patients Only)       Balance Overall balance assessment: Needs assistance Sitting-balance support: Feet supported Sitting balance-Leahy Scale: Good     Standing balance support: Bilateral upper extremity supported, During functional activity Standing balance-Leahy Scale: Fair                               Pertinent Vitals/Pain Pain Assessment Pain Assessment: 0-10 Pain Score: 2  Pain Location: L knee Pain Descriptors / Indicators: Sore Pain Intervention(s): Limited activity within patient's tolerance, Monitored during session, Repositioned, Ice applied    Home Living Family/patient expects to be discharged to:: Private residence Living Arrangements: Other relatives;Children Available Help at Discharge: Family;Available 24 hours/day Type of Home: House Home Access: Stairs to enter Entrance Stairs-Rails: Lawyer of Steps: 4   Home Layout: One level   Additional Comments: lives with at least one son, but will be staying with daughter    Prior Function Prior Level of Function : Independent/Modified Independent;Working/employed  Extremity/Trunk Assessment   Upper Extremity Assessment Upper Extremity Assessment: Overall WFL for tasks assessed    Lower Extremity Assessment Lower Extremity Assessment: Generalized weakness       Communication        Cognition Arousal: Alert Behavior During Therapy: WFL for tasks  assessed/performed   PT - Cognitive impairments: No apparent impairments                                 Cueing       General Comments      Exercises Total Joint Exercises Heel Slides: AROM, 10 reps, Seated, Left Long Arc Quad: AROM, Left, 10 reps Goniometric ROM: 0-85 degrees   Assessment/Plan    PT Assessment Patient needs continued PT services  PT Problem List Decreased strength;Pain;Decreased range of motion;Decreased activity tolerance;Decreased knowledge of use of DME;Decreased balance;Decreased mobility;Decreased knowledge of precautions       PT Treatment Interventions DME instruction;Balance training;Gait training;Neuromuscular re-education;Stair training;Functional mobility training;Patient/family education;Therapeutic exercise;Therapeutic activities    PT Goals (Current goals can be found in the Care Plan section)  Acute Rehab PT Goals Patient Stated Goal: to go home PT Goal Formulation: With patient Time For Goal Achievement: 03/28/24 Potential to Achieve Goals: Good    Frequency BID     Co-evaluation               AM-PAC PT 6 Clicks Mobility  Outcome Measure Help needed turning from your back to your side while in a flat bed without using bedrails?: None Help needed moving from lying on your back to sitting on the side of a flat bed without using bedrails?: None Help needed moving to and from a bed to a chair (including a wheelchair)?: None Help needed standing up from a chair using your arms (e.g., wheelchair or bedside chair)?: None Help needed to walk in hospital room?: A Little Help needed climbing 3-5 steps with a railing? : A Little 6 Click Score: 22    End of Session   Activity Tolerance: Patient tolerated treatment well Patient left: in chair;with call bell/phone within reach Nurse Communication: Mobility status PT Visit Diagnosis: Other abnormalities of gait and mobility (R26.89);Difficulty in walking, not elsewhere  classified (R26.2);Muscle weakness (generalized) (M62.81);Pain Pain - Right/Left: Left Pain - part of body: Knee    Time: 1510-1527 PT Time Calculation (min) (ACUTE ONLY): 17 min   Charges:   PT Evaluation $PT Eval Low Complexity: 1 Low PT Treatments $Therapeutic Activity: 8-22 mins PT General Charges $$ ACUTE PT VISIT: 1 Visit         Doyal Shams PT, DPT 3:40 PM,03/14/24

## 2024-03-14 NOTE — Inpatient Diabetes Management (Signed)
 Inpatient Diabetes Program Recommendations  AACE/ADA: New Consensus Statement on Inpatient Glycemic Control   Target Ranges:  Prepandial:   less than 140 mg/dL      Peak postprandial:   less than 180 mg/dL (1-2 hours)      Critically ill patients:  140 - 180 mg/dL    Latest Reference Range & Units 03/14/24 06:34 03/14/24 10:02  Glucose-Capillary 70 - 99 mg/dL 834 (H) 860 (H)    Review of Glycemic Control  Diabetes history: DM2 Outpatient Diabetes medications: Basaglar  32 units daily, Jardiance  25 mg daily, Mounjaro  5 mg Qweek, Januvia  100 mg daily Current orders for Inpatient glycemic control: Novolog  0-15 units TID with meals, Novolog  0-5 units QHS  Inpatient Diabetes Program Recommendations:    Insulin : Please consider ordering Semglee  10 units at bedtime.  Thanks, Earnie Gainer, RN, MSN, CDCES Diabetes Coordinator Inpatient Diabetes Program [phone] (Team Pager from 8am to 5pm)

## 2024-03-14 NOTE — H&P (Signed)
 History of Present Illness: The patient is an 68 y.o. female seen in clinic today for history and physical for left total knee arthroplasty with Dr. Lorelle on 03/14/2024. Patient has advanced left knee osteoarthritis. She has had many years of increasing pain with diminishing results with conservative treatment. She has underwent genicular nerve blocks, cortisone injections as well as gel injections with no improvement. She has recently lost 40 pounds and gotten her weight down to a BMI of 40. She continues to have severe debilitating left knee pain that is interfering with her quality of life and activities of daily living. Her A1c is down to 6.4. Her pain is 8 out of 10 along the medial anterior aspect of her left knee. Her knee will swell and become tight and she has a hard time standing from a seated position.  Past Medical History: Past Medical History:  Diagnosis Date  Diabetes mellitus type 2, uncomplicated (CMS/HHS-HCC)  Hypertension   Past Surgical History: Past Surgical History:  Procedure Laterality Date  Right reverse total shoulder arthroplasty, Rt biceps tenodesis Right 08/27/2020  Dr. Tobie  ARTHROPLASTY HIP TOTAL Right 11/29/2021  By Dr. Kathlynn  CESAREAN SECTION  HERNIA REPAIR   Past Family History: Family History  Problem Relation Age of Onset  Diabetes type II Mother   Medications: Current Outpatient Medications  Medication Sig Dispense Refill  acetaminophen  (TYLENOL ) 500 MG tablet Take 1-2 tablets by mouth every 6 (six) hours as needed  amLODIPine  (NORVASC ) 10 MG tablet Take 1 tablet by mouth once daily  carvediloL  (COREG ) 25 MG tablet  diclofenac  (VOLTAREN ) 50 MG EC tablet Take 50 mg by mouth 2 (two) times daily  empagliflozin  (JARDIANCE ) 25 mg tablet Take 1 tablet by mouth once daily  fenofibrate  54 MG tablet Take 1 tablet by mouth once daily  FUROsemide  (LASIX ) 40 MG tablet Take 2 tablets (80 mg total) by mouth daily.  gabapentin  (NEURONTIN ) 100 MG capsule   insulin  GLARGINE (LANTUS  SOLOSTAR) pen injector (concentration 100 units/mL) Inject 32 Units subcutaneously at bedtime  JANUVIA  100 mg tablet  LEVEMIR  FLEXPEN pen injector (concentration 100 units/mL) INJECT UP TO 45 UNITS SUBCUTANEOUSLY DAILY AS DIRECTED  MOUNJARO  5 mg/0.5 mL pen injector INJECT ONE SYRINGEFUL (5MG ) INTO THE SKIN ONCE WEEKLY  ondansetron  (ZOFRAN ) 4 MG tablet Take 1 tablet by mouth every 6 (six) hours as needed  tiZANidine  (ZANAFLEX ) 2 MG tablet Take 1 tablet (2 mg total) by mouth every 8 (eight) hours as needed for muscle spasms (pt not certain of dosage).  tiZANidine  (ZANAFLEX ) 4 MG tablet TAKE 1 TABLET BY MOUTH EVERY 8 HOURS AS NEEDED FOR MUSCLE SPASM 30 tablet 0  traMADoL  (ULTRAM ) 50 mg tablet Take 1 tablet (50 mg total) by mouth every 6 (six) hours as needed for Pain 50 tablet 0   No current facility-administered medications for this visit.   Allergies: Allergies  Allergen Reactions  Prednisone Swelling    Visit Vitals: Vitals:  03/02/24 1546  BP: (!) 144/78    Review of Systems:  A comprehensive 14 point ROS was performed, reviewed, and the pertinent orthopaedic findings are documented in the HPI.  Physical Exam: General:  Well developed, well nourished, no apparent distress, normal affect, antalgic gait with no assistive device  HEENT: Head normocephalic, atraumatic, PERRL.   Abdomen: Soft, non tender, non distended, Bowel sounds present.  Heart: Examination of the heart reveals regular, rate, and rhythm. There is no murmur noted on ascultation. There is a normal apical pulse.  Lungs: Lungs are  clear to auscultation. There is no wheeze, rhonchi, or crackles. There is normal expansion of bilateral chest walls.   Comprehensive Knee Exam: Gait Antalgic  Alignment Neutral   Inspection Right Left  Skin Normal appearance with no obvious deformity. No ecchymosis or erythema. Normal appearance with no obvious deformity. No ecchymosis or erythema.   Soft Tissue No focal soft tissue swelling No focal soft tissue swelling  Quad Atrophy None None   Palpation  Right Left  Tenderness Medial joint line tenderness to palpation Medial joint line and parapatellar tenderness palpation  Crepitus + patellofemoral and tibiofemoral crepitus + patellofemoral and tibiofemoral crepitus  Effusion None None   Range of Motion Right Left  Flexion 5-95 5-95  Extension Full knee extension without hyperextension Full knee extension without hyperextension   Ligamentous Exam Right Left  Lachman Normal Normal  Valgus 0 Normal Normal  Valgus 30 Normal Normal  Varus 0 Normal Normal  Varus 30 Normal Normal  Anterior Drawer Normal Normal  Posterior Drawer Normal Normal   Meniscal Exam Right Left  Hyperflexion Test Positive Positive  Hyperextension Test Negative Positive  McMurray's Negative Positive   Neurovascular Right Left  Quadriceps Strength 5/5 5/5  Hamstring Strength 5/5 5/5  Hip Abductor Strength 4/5 4/5  Distal Motor Normal Normal  Distal Sensory Normal light touch sensation Normal light touch sensation  Distal Pulses Normal Normal    Imaging Studies: I have reviewed AP, lateral,sunrise, and flexed PA weight bearing knee X-rays (4 views) of the left knee that show severe degenerative changes with tricompartmental involvement. There is medial patellofemoral bone-on-bone articulation with lateral subluxation of the tibia relative to the femur. Osteophyte formation, sclerosis and subchondral cyst formation. Kellgren-Lawrence grade 4. AP, sunrise and flexed PA of the right knee shows similar tricompartmental degenerative changes with bone-on-bone articulation, osteophyte formation and sclerosis. Kellgren-Lawrence grade 4. No fractures or dislocations noted in either knee.   Assessment:  ICD-10-CM  1. Primary osteoarthritis of left knee M17.12    Plan: Arizbeth is a 68 year old female here for history and physical for left total knee  arthroplasty with Dr. Lorelle on 03/14/2024. Patient has advanced left knee osteoarthritis with complete loss of joint space in the medial and lateral compartment of the left knee. Patient's pain is severe debilitating and interfering with her quality of life and activities day living. She said diminishing results with conservative treatment. Risks, benefits, complications of left total knee arthroplasty have discussed with the patient patient has agreed and consented the procedure with Dr. Lorelle on 03/14/2024.  Patient has received clearance for surgery. Agrees with plan for left total knee arthroplasty.

## 2024-03-14 NOTE — Op Note (Signed)
 Patient Name: Stacy Martinez  FMW:[id number]  Pre-Operative Diagnosis: Left knee Osteoarthritis  Post-Operative Diagnosis: (same)  Procedure: Left Total Knee Arthroplasty  Components/Implants: Femur: Persona Size 6 CR PPS   Tibia: Persona Size E OsseoTi  Poly: 14mm MC  Patella: 32x75mm symmetric OsseoTi  Femoral Valgus Cut Angle: 5 degrees  Distal Femoral Re-cut: none  Patella Resurfacing: yes   Date of Surgery: 03/14/2024  Surgeon: Arthea Sheer MD  Assistant: Debby Amber PA (present and scrubbed throughout the case, critical for assistance with exposure, retraction, instrumentation, and closure)   Anesthesiologist: Mazzoni  Anesthesia: Spinal   Tourniquet Time: 65 min  EBL: 25cc  IVF: 850cc  Complications: None   Brief history: The patient is a 68 year old female with a history of osteoarthritis of the left knee with pain limiting their range of motion and activities of daily living, which has failed multiple attempts at conservative therapy.  The risks and benefits of total knee arthroplasty as definitive surgical treatment were discussed with the patient, who opted to proceed with the operation.  After outpatient medical clearance and optimization was completed the patient was admitted to [facility] for the procedure.  All preoperative films were reviewed and an appropriate surgical plan was made prior to surgery. Preoperative range of motion was 5 to 95.   Description of procedure: The patient was brought to the operating room where laterality was confirmed by all those present to be the left side.   Spinal anesthesia was administered and the patient received an intravenous dose of antibiotics for surgical prophylaxis and a dose of tranexamic acid .  Patient is positioned supine on the operating room table with all bony prominences well-padded.  A well-padded tourniquet was applied to the left thigh.  The knee was then prepped and draped in usual  sterile fashion with multiple layers of adhesive and nonadhesive drapes.  All of those present in the operating room participated in a surgical timeout laterality and patient were confirmed.   An Esmarch was wrapped around the extremity and the leg was elevated and the knee flexed.  The tourniquet was inflated to a pressure of 275 mmHg. The Esmarch was removed and the leg was brought down to full extension.  The patella and tibial tubercle identified and outlined using a marking pen and a midline skin incision was made with a knife carried through the subcutaneous tissue down to the extensor retinaculum.  After exposure of the extensor mechanism the medial parapatellar arthrotomy was performed with a scalpel and electrocautery extending down medial and distal to the tibial tubercle taking care to avoid incising the patellar tendon.   A standard medial release was performed over the proximal tibia.  The knee was brought into extension in order to excise the fat pad taking care not to damage the patella tendon.  The superior soft tissue was removed from the anterior surface of the distal femur to visualize for the procedure.  The knee was then brought into flexion with the patella subluxed laterally and subluxing the tibia anteriorly.  The ACL was transected and removed with electrocautery and additional soft tissue was removed from the proximal surface of the tibia to fully expose. The PCL was found to be intact and was preserved.  An extramedullary tibial cutting guide was then applied to the leg with a spring-loaded ankle clamp placed around the distal tibia just above the malleoli the angulation of the guide was adjusted to give some posterior slope in the tibial resection with an  appropriate varus/valgus alignment.  The resection guide was then pinned to the proximal tibia and the proximal tibial surface was resected with an oscillating saw.  Careful attention was paid to ensure the blade did not disrupt any  of the soft tissues including any lateral or medial ligament.  Attention was then turned to the femur, with the knee slightly flexed a opening drill was used to enter the medullary canal of the femur.  After removing the drill marrow was suctioned out to decompress the distal femur.  An intramedullary femoral guide was then inserted into the drill hole and the alignment guide was seated firmly against the distal end of the medial femoral condyle.  The distal femoral cutting guide was then attached and pinned securely to the anterior surface of the femur and the intramedullary rod and alignment guide was removed.  Distal femur resection was then performed with an oscillating saw with retractors protecting medial and laterally.   The distal cutting block was then removed and the extension gap was checked with a spacer.  Extension gap was found to be appropriately sized to accommodate the spacer block.   The femoral sizing guide was then placed securely into the posterior condyles of the femur and the femoral size was measured and determined to be 6.  The size 6; 4-in-1 cutting guide was placed in position and secured with 2 pins.  The anterior posterior and chamfer resections were then performed with an oscillating saw.  Bony fragments and osteophytes were then removed.  Using a lamina spreader the posterior medial and lateral condyles were checked for additional osteophytes and posterior soft tissue remnants.  Any remaining meniscus was removed at this time.  Periarticular injection was performed in the meniscal rims and posterior capsule with aspiration performed to ensure no intravascular injection.   The tibia was then exposed and the tibial trial was pinned onto the plateau after confirming appropriate orientation and rotation.  Using the drill bushing the tibia was prepared to the appropriate drill depth.  Tibial broach impactor was then driven through the punch guide using a mallet.  The femoral trial  component was then inserted onto the femur. A trial tibial polyethylene bearing was then placed and the knee was reduced.  The knee achieved full extension with no hyperextension and was found to be balanced in flexion and extension with the trials in place.  The knee was then brought into full extension the patella was everted and held with 2 Kocher clamps.  The articular surface of the patella was then resected with an patella reamer and saw after careful measurement with a caliper.  The patella was then prepared with the drill guide and a trial patella was placed.  The knee was then taken through range of motion and it was found that the patella articulated appropriately with the trochlea and good patellofemoral motion without subluxation.    The correct final components for implantation were confirmed and opened by the circulator nurse.  The knee was irrigated with normal saline via pulsatile lavage to remove any bony debris or soft tissue.  The prepared surface of the tibia was exposed and the tibial component was implanted with good bony contact.  The femoral component was then placed and impacted showing good coverage and a good snug fit.  The patella was then cleared off and the patella compression tool was used to apply the patellar component with symmetric compression onto the patella.  The tibial component was then irrigated and cleared  of any debris and a real polyethylene component was placed and engaged with the locking mechanism.  The knee was then injected with the particular cocktail.  The knee was taken through range of motion and found to be stable in flexion and extension with patellar tracking.  The knee was then irrigated with copious amount of normal saline via pulsatile lavage to remove all loose bodies and other debris.  The knee was then irrigated with surgiphor betadine based wash and reirrigated with saline.  The tourniquet was then dropped and all bleeding vessels were identified and  coagulated.  The arthrotomy was approximated with #1 Vicryl and closed with #1 Stratafix suture.  The knee was brought into slight flexion and the subcutaneous tissues were closed with 0 Vicryl, 2-0 Vicryl and a running subcuticular 4-0 stratafix barbed suture.  Skin was then glued with Dermabond.  A sterile adhesive dressing was then placed along with a sequential compression device to the calf, a Ted stocking, and a cryotherapy cuff.   Sponge, needle, and Lap counts were all correct at the end of the case.   The patient was transferred off of the operating room table to a hospital bed, good pulses were found distally on the operative side.  The patient was transferred to the recovery room in stable condition.

## 2024-03-14 NOTE — Transfer of Care (Signed)
 Immediate Anesthesia Transfer of Care Note  Patient: Stacy Martinez  Procedure(s) Performed: ARTHROPLASTY, KNEE, TOTAL (Left: Knee)  Patient Location: PACU  Anesthesia Type:Spinal  Level of Consciousness: drowsy and patient cooperative  Airway & Oxygen Therapy: Patient Spontanous Breathing and Patient connected to face mask oxygen  Post-op Assessment: Report given to RN and Post -op Vital signs reviewed and stable  Post vital signs: Reviewed and stable  Last Vitals:  Vitals Value Taken Time  BP 86/37 03/14/24 09:53  Temp    Pulse 74 03/14/24 09:56  Resp 17 03/14/24 09:56  SpO2 97 % 03/14/24 09:56  Vitals shown include unfiled device data.  Last Pain:  Vitals:   03/14/24 0636  PainSc: 0-No pain         Complications: No notable events documented.

## 2024-03-14 NOTE — Anesthesia Procedure Notes (Signed)
 Spinal  Patient location during procedure: OR Start time: 03/14/2024 7:34 AM End time: 03/14/2024 7:38 AM Reason for block: surgical anesthesia Staffing Performed: resident/CRNA  Anesthesiologist: Shellie Odor, MD Resident/CRNA: Lorrene Camelia LABOR, CRNA Performed by: Lorrene Camelia LABOR, CRNA Authorized by: Mazzoni, Andrea, MD   Preanesthetic Checklist Completed: patient identified, IV checked, site marked, risks and benefits discussed, surgical consent, monitors and equipment checked, pre-op evaluation and timeout performed Spinal Block Patient position: sitting Prep: DuraPrep Patient monitoring: heart rate, cardiac monitor, continuous pulse ox and blood pressure Approach: midline Location: L3-4 Injection technique: single-shot Needle Needle type: Pencan  Needle gauge: 24 G Needle length: 9 cm Assessment Sensory level: T4 Events: CSF return

## 2024-03-14 NOTE — Discharge Summary (Signed)
 Physician Discharge Summary  Patient ID: Stacy Martinez MRN: [id number] DOB/AGE: 08-Mar-1956 68 y.o.  Admit date: 03/14/2024 Discharge date: 03/15/2024  Admission Diagnoses:  Primary osteoarthritis of left knee [M17.12] S/P TKR (total knee replacement), left [Z96.652]   Discharge Diagnoses: Patient Active Problem List   Diagnosis Date Noted   S/P TKR (total knee replacement), left 03/14/2024   Bilateral primary osteoarthritis of knee 10/02/2022   Thrombocytosis 10/08/2021   Class 3 severe obesity due to excess calories with body mass index (BMI) of 40.0 to 44.9 in adult 06/04/2021   Chronic pain syndrome 04/24/2021   Spondylosis without myelopathy or radiculopathy, lumbar region 01/15/2016   Hyperlipidemia associated with type 2 diabetes mellitus (HCC) 11/30/2015   Cardiomyopathy (HCC) 11/30/2015   BP (high blood pressure) 04/18/2015   DM type 2 with diabetic peripheral neuropathy (HCC) 04/18/2015    Past Medical History:  Diagnosis Date   Abdominal hernia s/p mesh repair with recurrence and second repair    Allergy    Anemia    Aortic atherosclerosis (HCC)    Arthritis    DDD (degenerative disc disease), lumbar    Enlarged heart    Hepatic steatosis    HFrEF (heart failure with reduced ejection fraction) (HCC)    HLD (hyperlipidemia)    Hypertension    LBBB (left bundle branch block)    Peripartum cardiomyopathy    T2DM (type 2 diabetes mellitus) (HCC)      Transfusion: none   Consultants (if any):   Discharged Condition: Improved  Hospital Course: Stacy Martinez is an 68 y.o. female who was admitted 03/14/2024 with a diagnosis of S/P TKR (total knee replacement), left and went to the operating room on 03/14/2024 and underwent the above named procedures.    Surgeries: Procedure(s): ARTHROPLASTY, KNEE, TOTAL on 03/14/2024 Patient tolerated the surgery well. Taken to PACU where she was stabilized and then transferred to the orthopedic floor.  Started on Lovenox  30 mg q  12 hrs. TEDs and SCDs applied bilaterally. Heels elevated on bed. No evidence of DVT. Negative Homan. Physical therapy started on day #1 for gait training and transfer. OT started day #1 for ADL and assisted devices.  Patient's IV was d/c on day #1. Patient was able to safely and independently complete all PT goals. PT recommending discharge to home.    On post op day #1 patient was stable and ready for discharge to home with HHPT.  Implants: Femur: Persona Size 6 CR PPS   Tibia: Persona Size E OsseoTi  Poly: 14mm MC  Patella: 32x38mm symmetric OsseoTi   She was given perioperative antibiotics:  Anti-infectives (From admission, onward)    Start     Dose/Rate Route Frequency Ordered Stop   03/14/24 1330  ceFAZolin  (ANCEF ) IVPB 2g/100 mL premix        2 g 200 mL/hr over 30 Minutes Intravenous Every 6 hours 03/14/24 1014 03/14/24 2032   03/14/24 0645  ceFAZolin  (ANCEF ) IVPB 2g/100 mL premix        2 g 200 mL/hr over 30 Minutes Intravenous On call to O.R. 03/14/24 0630 03/14/24 0752     .  She was given sequential compression devices, early ambulation, and Lovenox  TEDs for DVT prophylaxis.  She benefited maximally from the hospital stay and there were no complications.    Recent vital signs:  Vitals:   03/15/24 0600 03/15/24 0743  BP: (!) 142/60 (!) 127/45  Pulse: 74 73  Resp: 16 17  Temp: 97.9 F (36.6 C)  98.1 F (36.7 C)  SpO2: 98% 97%    Recent laboratory studies:  Lab Results  Component Value Date   HGB 11.1 (L) 03/15/2024   HGB 12.8 03/01/2024   HGB 12.9 11/27/2023   Lab Results  Component Value Date   WBC 13.5 (H) 03/15/2024   PLT 289 03/15/2024   No results found for: INR Lab Results  Component Value Date   NA 136 03/15/2024   K 3.8 03/15/2024   CL 108 03/15/2024   CO2 20 (L) 03/15/2024   BUN 18 03/15/2024   CREATININE 0.71 03/15/2024   GLUCOSE 128 (H) 03/15/2024    Discharge Medications:   Allergies as of 03/15/2024       Reactions   Prednisone  Swelling   Metformin  And Related Diarrhea, Nausea Only   Severe nausea and diarrhea even with extended release versions        Medication List     STOP taking these medications    diclofenac  50 MG EC tablet Commonly known as: VOLTAREN    ibuprofen 200 MG tablet Commonly known as: ADVIL       TAKE these medications    acetaminophen  500 MG tablet Commonly known as: TYLENOL  Take 2 tablets (1,000 mg total) by mouth every 8 (eight) hours.   amLODipine  10 MG tablet Commonly known as: NORVASC  Take 1 tablet (10 mg total) by mouth daily.   Basaglar  KwikPen 100 UNIT/ML INJECT 32 UNITS SUBCUTANEOUSLY ONCE DAILY . APPOINTMENT REQUIRED FOR FUTURE REFILLS   bisacodyl  5 MG EC tablet Commonly known as: DULCOLAX Take 5 mg by mouth daily as needed for moderate constipation.   carvedilol  25 MG tablet Commonly known as: COREG  TAKE 1 TABLET BY MOUTH TWICE DAILY WITH MEALS (APPOINTMENT  REQUIRED  FOR  FUTURE  REFILLS)   celecoxib  200 MG capsule Commonly known as: CeleBREX  Take 1 capsule (200 mg total) by mouth 2 (two) times daily for 10 days.   docusate sodium  100 MG capsule Commonly known as: COLACE Take 1 capsule (100 mg total) by mouth 2 (two) times daily.   empagliflozin  25 MG Tabs tablet Commonly known as: Jardiance  Take 1 tablet (25 mg total) by mouth daily.   enalapril  20 MG tablet Commonly known as: VASOTEC  Take 1 tablet (20 mg total) by mouth 2 (two) times daily.   enoxaparin  40 MG/0.4ML injection Commonly known as: LOVENOX  Inject 0.4 mLs (40 mg total) into the skin daily for 14 days.   fenofibrate  54 MG tablet Take 1 tablet (54 mg total) by mouth daily.   ferrous sulfate 325 (65 FE) MG tablet Take 325 mg by mouth daily with breakfast.   furosemide  40 MG tablet Commonly known as: LASIX  Take 1 tablet (40 mg total) by mouth daily.   gabapentin  300 MG capsule Commonly known as: NEURONTIN  Take 1 capsule by mouth twice daily   ondansetron  4 MG tablet Commonly  known as: ZOFRAN  Take 1 tablet (4 mg total) by mouth every 6 (six) hours as needed for nausea. What changed: Another medication with the same name was added. Make sure you understand how and when to take each.   ondansetron  4 MG tablet Commonly known as: ZOFRAN  Take 1 tablet (4 mg total) by mouth every 6 (six) hours as needed for nausea. What changed: You were already taking a medication with the same name, and this prescription was added. Make sure you understand how and when to take each.   oxyCODONE  5 MG immediate release tablet Commonly known as: Roxicodone  Take  0.5-1 tablets (2.5-5 mg total) by mouth every 8 (eight) hours as needed for breakthrough pain.   RELION GLUCOSE TEST STRIPS test strip Generic drug: glucose blood Use as instructed   ReliOn Ultra Thin Lancets Misc 1 each by Does not apply route daily.   sitaGLIPtin  100 MG tablet Commonly known as: Januvia  Take 1 tablet by mouth once daily   tirzepatide  5 MG/0.5ML Pen Commonly known as: MOUNJARO  Inject 5 mg into the skin once a week.   tiZANidine  4 MG tablet Commonly known as: Zanaflex  Take 1 tablet (4 mg total) by mouth every 8 (eight) hours as needed for muscle spasms.   traMADol  50 MG tablet Commonly known as: ULTRAM  Take 1 tablet (50 mg total) by mouth every 6 (six) hours as needed for moderate pain (pain score 4-6).   UltiCare Mini Pen Needles 32G X 6 MM Misc Generic drug: Insulin  Pen Needle 1 Bottle by Does not apply route as directed.         Diagnostic Studies: DG Knee Left Port Result Date: 03/14/2024 CLINICAL DATA:  Status post total knee replacement EXAM: PORTABLE LEFT KNEE - 1-2 VIEW COMPARISON:  06/04/2021 FINDINGS: Left knee arthroplasty. Anterior soft tissue swelling. Small volume intra-articular gas. No hardware complication. IMPRESSION: Expected appearance after left knee arthroplasty. Electronically Signed   By: Rockey Kilts M.D.   On: 03/14/2024 10:44    Disposition:      Follow-up  Information     Charlene Debby BROCKS, PA-C Follow up in 2 week(s).   Specialties: Orthopedic Surgery, Emergency Medicine Contact information: [address] [place] [address] [phone]                  Signed: Debby BROCKS Charlene 03/15/2024, 8:06 AM

## 2024-03-14 NOTE — Anesthesia Preprocedure Evaluation (Addendum)
 Anesthesia Evaluation  Patient identified by MRN, date of birth, ID band Patient awake    Reviewed: Allergy & Precautions, NPO status , Patient's Chart, lab work & pertinent test results  History of Anesthesia Complications Negative for: history of anesthetic complications  Airway Mallampati: I   Neck ROM: Full    Dental  (+) Chipped   Pulmonary neg pulmonary ROS   Pulmonary exam normal breath sounds clear to auscultation       Cardiovascular hypertension, +CHF  Normal cardiovascular exam Rhythm:Regular Rate:Normal  ECG 03/01/24:  Normal sinus rhythm Left bundle branch block Abnormal ECG When compared with ECG of 15-Nov-2021 11:18, No significant change was found   Neuro/Psych negative neurological ROS     GI/Hepatic negative GI ROS,,,  Endo/Other  diabetes, Type 2, Insulin  Dependent  Obesity   Renal/GU      Musculoskeletal  (+) Arthritis ,    Abdominal   Peds  Hematology  (+) Blood dyscrasia, anemia   Anesthesia Other Findings Last dose of Mounjaro  03/06/24.  Reviewed and agree with Dorise Boor pre-anesthesia clinical review note.    Cardiology note 03/09/24:  68 y.o. female with  Preop risk stratification, for knee replacement surgery Chronic left bundle branch block Hypertension Diabetes type 2, hyperlipidemia, obesity Venous insufficiency  Patient with no complaints of chest pain or shortness of breath. Euvolemic on exam. EKG showed sinus rhythm with known left bundle branch block Regarding preop restratification, she will be at low risk for knee surgery. Optimized from cardiac standpoint. Blood pressure well-controlled, continue current antihypertensive. On as needed Lasix  for pedal edema  Orders Placed This Encounter  Procedures  ECG 12-lead   Return in about 1 year (around 03/09/2025).    Reproductive/Obstetrics                              Anesthesia  Physical Anesthesia Plan  ASA: 3  Anesthesia Plan: Spinal   Post-op Pain Management:    Induction: Intravenous  PONV Risk Score and Plan: 3 and Propofol  infusion, TIVA, Treatment may vary due to age or medical condition and Ondansetron   Airway Management Planned: Natural Airway and Nasal Cannula  Additional Equipment:   Intra-op Plan:   Post-operative Plan:   Informed Consent: I have reviewed the patients History and Physical, chart, labs and discussed the procedure including the risks, benefits and alternatives for the proposed anesthesia with the patient or authorized representative who has indicated his/her understanding and acceptance.       Plan Discussed with: CRNA  Anesthesia Plan Comments: (Plan for spinal and GA with natural airway, LMA/GETA backup.  Patient consented for risks of anesthesia including but not limited to:  - adverse reactions to medications - damage to eyes, teeth, lips or other oral mucosa - nerve damage due to positioning  - sore throat or hoarseness - headache, bleeding, infection, nerve damage 2/2 spinal - damage to heart, brain, nerves, lungs, other parts of body or loss of life  Informed patient about role of CRNA in peri- and intra-operative care.  Patient voiced understanding.)         Anesthesia Quick Evaluation

## 2024-03-15 ENCOUNTER — Encounter: Payer: Self-pay | Admitting: Orthopedic Surgery

## 2024-03-15 DIAGNOSIS — M1712 Unilateral primary osteoarthritis, left knee: Secondary | ICD-10-CM | POA: Diagnosis not present

## 2024-03-15 LAB — BASIC METABOLIC PANEL WITH GFR
Anion gap: 8 (ref 5–15)
BUN: 18 mg/dL (ref 8–23)
CO2: 20 mmol/L — ABNORMAL LOW (ref 22–32)
Calcium: 8.4 mg/dL — ABNORMAL LOW (ref 8.9–10.3)
Chloride: 108 mmol/L (ref 98–111)
Creatinine, Ser: 0.71 mg/dL (ref 0.44–1.00)
GFR, Estimated: >60 mL/min (ref >60)
Glucose, Bld: 128 mg/dL — ABNORMAL HIGH (ref 70–99)
Potassium: 3.8 mmol/L (ref 3.5–5.1)
Sodium: 136 mmol/L (ref 135–145)

## 2024-03-15 LAB — CBC
HCT: 34.6 % — ABNORMAL LOW (ref 36.0–46.0)
Hemoglobin: 11.1 g/dL — ABNORMAL LOW (ref 12.0–15.0)
MCH: 27 pg (ref 26.0–34.0)
MCHC: 32.1 g/dL (ref 30.0–36.0)
MCV: 84.2 fL (ref 80.0–100.0)
Platelets: 289 K/uL (ref 150–400)
RBC: 4.11 MIL/uL (ref 3.87–5.11)
RDW: 15.4 % (ref 11.5–15.5)
WBC: 13.5 K/uL — ABNORMAL HIGH (ref 4.0–10.5)
nRBC: 0 % (ref 0.0–0.2)

## 2024-03-15 LAB — GLUCOSE, CAPILLARY
Glucose-Capillary: 120 mg/dL — ABNORMAL HIGH (ref 70–99)
Glucose-Capillary: 126 mg/dL — ABNORMAL HIGH (ref 70–99)

## 2024-03-15 MED ORDER — ENALAPRIL MALEATE 10 MG PO TABS
20.0000 mg | ORAL_TABLET | Freq: Two times a day (BID) | ORAL | Status: DC
  Filled 2024-03-15: qty 2

## 2024-03-15 MED ORDER — GABAPENTIN 300 MG PO CAPS
ORAL_CAPSULE | ORAL | Status: AC
  Filled 2024-03-15: qty 1

## 2024-03-15 NOTE — Progress Notes (Signed)
 Physical Therapy Treatment Patient Details Name: Stacy Martinez MRN: [id number] DOB: 1955/12/27 Today's Date: 03/15/2024   History of Present Illness Pt is a 68 yo female s/p L TKA. PMH of HTN, CHF, DM.    PT Comments  Patient alert, agreeable to PT, motivated to go home. Reported 2/10 L knee pain with mobility. Able to perform bed mobility modI, transferred from EOB and from [facility] over standard commode supervision with RW, and able to stand at sink and wash hands without assistance. She ambulated ~258ft with RW and progressed to supervision, and noted for improved step through pattern and LLE weight bearing, still antalgic. Able to perform safe stair navigation and teach back technique. Pt/PT reviewed HEP compliance and car transfers as well. The patient would benefit from further skilled PT intervention to continue to progress towards goals.     If plan is discharge home, recommend the following: Assistance with cooking/housework;Help with stairs or ramp for entrance;Direct supervision/assist for medications management;A little help with bathing/dressing/bathroom   Can travel by private vehicle        Equipment Recommendations  BSC/3in1 [facility]?)    Recommendations for Other Services       Precautions / Restrictions Precautions Precautions: Fall Recall of Precautions/Restrictions: Intact Restrictions Weight Bearing Restrictions Per Provider Order: Yes LLE Weight Bearing Per Provider Order: Weight bearing as tolerated     Mobility  Bed Mobility Overal bed mobility: Modified Independent                  Transfers Overall transfer level: Needs assistance Equipment used: Rolling walker (2 wheels) Transfers: Sit to/from Stand Sit to Stand: Supervision                Ambulation/Gait Ambulation/Gait assistance: Contact guard assist, Supervision Gait Distance (Feet): 250 Feet Assistive device: Rolling walker (2 wheels)   Gait velocity: decreased     General Gait  Details: improved cadence and step through pattern, antalgic gait still noted on LLE   Stairs Stairs: Yes Stairs assistance: Contact guard assist Stair Management: One rail Right, One rail Left, Step to pattern, Forwards Number of Stairs: 4 General stair comments: BUE on one rail, CGA, able to demonstrate and teach back safe technique   Wheelchair Mobility     Tilt Bed    Modified Rankin (Stroke Patients Only)       Balance Overall balance assessment: Needs assistance Sitting-balance support: Feet supported Sitting balance-Leahy Scale: Good     Standing balance support: Bilateral upper extremity supported, During functional activity Standing balance-Leahy Scale: Fair                              Communication    Cognition Arousal: Alert Behavior During Therapy: WFL for tasks assessed/performed   PT - Cognitive impairments: No apparent impairments                         Following commands: Intact      Cueing Cueing Techniques: Verbal cues  Exercises Total Joint Exercises Quad Sets: AROM, Left, 10 reps Heel Slides: AROM, 10 reps, Seated, Left Goniometric ROM: 0-90 degrees    General Comments        Pertinent Vitals/Pain Pain Assessment Pain Assessment: 0-10 Pain Score: 2  Pain Location: L knee Pain Descriptors / Indicators: Sore Pain Intervention(s): Limited activity within patient's tolerance, Monitored during session, Repositioned    Home Living  Prior Function            PT Goals (current goals can now be found in the care plan section) Progress towards PT goals: Progressing toward goals    Frequency    BID      PT Plan      Co-evaluation              AM-PAC PT 6 Clicks Mobility   Outcome Measure  Help needed turning from your back to your side while in a flat bed without using bedrails?: None Help needed moving from lying on your back to sitting on the side of a  flat bed without using bedrails?: None Help needed moving to and from a bed to a chair (including a wheelchair)?: None Help needed standing up from a chair using your arms (e.g., wheelchair or bedside chair)?: None Help needed to walk in hospital room?: A Little Help needed climbing 3-5 steps with a railing? : A Little 6 Click Score: 22    End of Session   Activity Tolerance: Patient tolerated treatment well Patient left: in chair;with call bell/phone within reach Nurse Communication: Mobility status PT Visit Diagnosis: Other abnormalities of gait and mobility (R26.89);Difficulty in walking, not elsewhere classified (R26.2);Muscle weakness (generalized) (M62.81);Pain Pain - Right/Left: Left Pain - part of body: Knee     Time: 9155-9086 PT Time Calculation (min) (ACUTE ONLY): 29 min  Charges:    $Gait Training: 8-22 mins $Therapeutic Activity: 8-22 mins PT General Charges $$ ACUTE PT VISIT: 1 Visit                     Doyal Shams PT, DPT 11:35 AM,03/15/24

## 2024-03-15 NOTE — Plan of Care (Signed)
 Progressing towards discharge

## 2024-03-15 NOTE — Plan of Care (Signed)
   Problem: Pain Management: Goal: Pain level will decrease with appropriate interventions Outcome: Progressing   Problem: Skin Integrity: Goal: Will show signs of wound healing Outcome: Progressing

## 2024-03-15 NOTE — TOC Transition Note (Signed)
 Transition of Care [facility]) - Discharge Note   Patient Details  Name: Stacy Martinez MRN: [id number] Date of Birth: 10/27/1955  Transition of [facility]) CM/SW Contact:  Lauraine JAYSON Carpen, LCSW Phone Number: 03/15/2024, 10:54 AM   Clinical Narrative: Patient has orders to discharge home today. She was set up with [facility] prior to admission. She is agreeable to this. Patient will discharge to her daughter's home: [address] [place], [place] [address]. Gave information to [employer]. DME recommendation for 3-in-1 and patient is agreeable. Ordered through [employer]. No further concerns. Her daughter will pick her up. CSW signing off.  Final next level of care: Home w Home Health Services Barriers to Discharge: No Barriers Identified   Patient Goals and CMS Choice     Choice offered to / list presented to : Patient      Discharge Placement                Patient to be transferred to facility by: Daughter   Patient and family notified of of transfer: 03/15/24  Discharge Plan and Services Additional resources added to the After Visit Summary for                  DME Arranged: 3-N-1 DME Agency: [employer] Date DME Agency Contacted: 03/15/24   Representative spoke with at DME Agency: [facility] HH Arranged: PT, OT HH Agency: [facility] Date HH Agency Contacted: 03/15/24   Representative spoke with at [facility] Agency: Shaun  Social Drivers of Health (SDOH) Interventions SDOH Screenings   Food Insecurity: No Food Insecurity (03/02/2024)   [facility]  Housing: Unknown (03/02/2024)   Received [facility]  Transportation Needs: No Transportation Needs (03/02/2024)   Received from [facility]  Utilities: Not At Risk (03/02/2024)   Received from [facility]  Alcohol Screen: Low Risk  (10/08/2021)  Depression (PHQ2-9): Low Risk  (03/03/2024)  Financial Resource  Strain: Low Risk  (03/02/2024)   Received from [facility]  Tobacco Use: Low Risk  (03/14/2024)     Readmission Risk Interventions     No data to display

## 2024-03-15 NOTE — TOC CM/SW Note (Addendum)
 Patient is not able to walk the distance required to go the bathroom, or he/she is unable to safely negotiate stairs required to access the bathroom.  A 3in1 BSC will alleviate this problem

## 2024-03-15 NOTE — Progress Notes (Signed)
   Subjective: 1 Day Post-Op Procedure(s) (LRB): ARTHROPLASTY, KNEE, TOTAL (Left) Patient reports pain as mild.   Patient is well, and has had no acute complaints or problems Denies any CP, SOB, ABD pain. We will continue therapy today.  Plan is to go Home after hospital stay.  Objective: Vital signs in last 24 hours: Temp:  [97.3 F (36.3 C)-98.2 F (36.8 C)] 98.1 F (36.7 C) (07/15 0743) Pulse Rate:  [65-78] 73 (07/15 0743) Resp:  [11-18] 17 (07/15 0743) BP: (86-144)/(37-60) 127/45 (07/15 0743) SpO2:  [95 %-99 %] 97 % (07/15 0743)  Intake/Output from previous day: 07/14 0701 - 07/15 0700 In: 2356.3 [P.O.:240; I.V.:1646.5; IV Piggyback:469.8] Out: 25 [Blood:25] Intake/Output this shift: No intake/output data recorded.  Recent Labs    03/15/24 0517  HGB 11.1*   Recent Labs    03/15/24 0517  WBC 13.5*  RBC 4.11  HCT 34.6*  PLT 289   Recent Labs    03/15/24 0517  NA 136  K 3.8  CL 108  CO2 20*  BUN 18  CREATININE 0.71  GLUCOSE 128*  CALCIUM  8.4*   No results for input(s): LABPT, INR in the last 72 hours.  EXAM General - Patient is Alert, Appropriate, and Oriented Extremity - Neurovascular intact Sensation intact distally Intact pulses distally Dorsiflexion/Plantar flexion intact No cellulitis present Compartment soft Dressing - dressing C/D/I and no drainage Motor Function - intact, moving foot and toes well on exam.   Past Medical History:  Diagnosis Date   Abdominal hernia s/p mesh repair with recurrence and second repair    Allergy    Anemia    Aortic atherosclerosis (HCC)    Arthritis    DDD (degenerative disc disease), lumbar    Enlarged heart    Hepatic steatosis    HFrEF (heart failure with reduced ejection fraction) (HCC)    HLD (hyperlipidemia)    Hypertension    LBBB (left bundle branch block)    Peripartum cardiomyopathy    T2DM (type 2 diabetes mellitus) (HCC)     Assessment/Plan:   1 Day Post-Op Procedure(s)  (LRB): ARTHROPLASTY, KNEE, TOTAL (Left) Principal Problem:   S/P TKR (total knee replacement), left  Estimated body mass index is 40.28 kg/m as calculated from the following:   Height as of 03/03/24: 5' 2 (1.575 m).   Weight as of 03/03/24: 99.9 kg. Advance diet Up with therapy Pain well controlled Labs and VSS CM to assist with discharge to home with HHPT today pending safe completion of PT goals  DVT Prophylaxis - Lovenox , TED hose, and SCDs Weight-Bearing as tolerated to left leg   T. Medford Amber, PA-C [facility] 03/15/2024, 8:05 AM

## 2024-03-15 NOTE — Progress Notes (Signed)
 DISCHARGE NOTE:  Pt given discharge instructions and verbalized understanding. TED hose on both legs. [facility] sent with pt. Pt wheeled to car by staff, daughter providing transportation home.

## 2024-03-18 ENCOUNTER — Other Ambulatory Visit: Payer: Self-pay | Admitting: Internal Medicine

## 2024-03-18 DIAGNOSIS — I10 Essential (primary) hypertension: Secondary | ICD-10-CM

## 2024-03-21 ENCOUNTER — Other Ambulatory Visit: Payer: Self-pay | Admitting: Internal Medicine

## 2024-03-21 ENCOUNTER — Other Ambulatory Visit: Payer: Self-pay | Admitting: Student in an Organized Health Care Education/Training Program

## 2024-03-21 DIAGNOSIS — E1142 Type 2 diabetes mellitus with diabetic polyneuropathy: Secondary | ICD-10-CM

## 2024-03-21 DIAGNOSIS — I10 Essential (primary) hypertension: Secondary | ICD-10-CM

## 2024-03-21 NOTE — Telephone Encounter (Signed)
 Requested Prescriptions  Pending Prescriptions Disp Refills   carvedilol  (COREG ) 25 MG tablet [Pharmacy Med Name: Carvedilol  25 MG Oral Tablet] 180 tablet 0    Sig: TAKE 1 TABLET BY MOUTH TWICE DAILY WITH MEALS . APPOINTMENT REQUIRED FOR FUTURE REFILLS     Cardiovascular: Beta Blockers 3 Failed - 03/21/2024  8:08 AM      Failed - Last BP in normal range    BP Readings from Last 1 Encounters:  03/15/24 (!) 145/57         Passed - Cr in normal range and within 360 days    Creat  Date Value Ref Range Status  11/27/2023 0.67 0.50 - 1.05 mg/dL Final   Creatinine, Ser  Date Value Ref Range Status  03/15/2024 0.71 0.44 - 1.00 mg/dL Final   Creatinine, Urine  Date Value Ref Range Status  11/27/2023 85 20 - 275 mg/dL Final         Passed - AST in normal range and within 360 days    AST  Date Value Ref Range Status  03/01/2024 15 15 - 41 U/L Final         Passed - ALT in normal range and within 360 days    ALT  Date Value Ref Range Status  03/01/2024 14 0 - 44 U/L Final         Passed - Last Heart Rate in normal range    Pulse Readings from Last 1 Encounters:  03/15/24 66         Passed - Valid encounter within last 6 months    Recent Outpatient Visits           2 weeks ago Bilateral primary osteoarthritis of knee   [facility] [place], Angeline ORN, NP   3 months ago Urinary urgency   [facility] [place], [place] W, NP   3 months ago DM type 2 with diabetic peripheral neuropathy [facility])   [place] [facility] [place], Angeline ORN, [place]

## 2024-03-23 NOTE — Telephone Encounter (Signed)
 Requested medication (s) are due for refill today: yes  Requested medication (s) are on the active medication list: yes  Last refill:  12/31/23 6 ml  Future visit scheduled: yes  Notes to clinic:  med not assigned to a protocol   Requested Prescriptions  Pending Prescriptions Disp Refills   tirzepatide  (MOUNJARO ) 5 MG/0.5ML Pen [Pharmacy Med Name: Mounjaro  5 MG/0.5ML Subcutaneous Solution Pen-injector] 12 mL     Sig: INJECT ONE SYRINGEFUL (5MG ) INTO THE SKIN ONCE WEEKLY     Off-Protocol Failed - 03/23/2024  1:43 PM      Failed - Medication not assigned to a protocol, review manually.      Passed - Valid encounter within last 12 months    Recent Outpatient Visits           2 weeks ago Bilateral primary osteoarthritis of knee   [place] [facility] [place], [place] W, NP   3 months ago Urinary urgency   [place] [facility] [place], Angeline ORN, NP   3 months ago DM type 2 with diabetic peripheral neuropathy [facility])   [place] [facility] [place], Angeline ORN, NP              Signed Prescriptions Disp Refills   JANUVIA  100 MG tablet 90 tablet 0    Sig: Take 1 tablet by mouth once daily     Endocrinology:  Diabetes - DPP-4 Inhibitors Passed - 03/23/2024  1:43 PM      Passed - HBA1C is between 0 and 7.9 and within 180 days    Hemoglobin A1C  Date Value Ref Range Status  01/06/2017 8.0  Final   Hgb A1c MFr Bld  Date Value Ref Range Status  11/27/2023 6.4 (H) <5.7 % of total Hgb Final    Comment:    For someone without known diabetes, a hemoglobin  A1c value between 5.7% and 6.4% is consistent with prediabetes and should be confirmed with a  follow-up test. . For someone with known diabetes, a value <7% indicates that their diabetes is well controlled. A1c targets should be individualized based on duration of diabetes, age, comorbid conditions, and other considerations. . This assay result is consistent with an increased  risk of diabetes. . Currently, no consensus exists regarding use of hemoglobin A1c for diagnosis of diabetes for children. .          Passed - Cr in normal range and within 360 days    Creat  Date Value Ref Range Status  11/27/2023 0.67 0.50 - 1.05 mg/dL Final   Creatinine, Ser  Date Value Ref Range Status  03/15/2024 0.71 0.44 - 1.00 mg/dL Final   Creatinine, Urine  Date Value Ref Range Status  11/27/2023 85 20 - 275 mg/dL Final         Passed - Valid encounter within last 6 months    Recent Outpatient Visits           2 weeks ago Bilateral primary osteoarthritis of knee   [place] [facility] [place], Angeline ORN, NP   3 months ago Urinary urgency   [place] [facility] [place], [place] W, NP   3 months ago DM type 2 with diabetic peripheral neuropathy [facility])   [place] [facility] [place], Angeline ORN, NP               JARDIANCE  25 MG TABS tablet 90 tablet 0  Sig: Take 1 tablet by mouth once daily     Endocrinology:  Diabetes - SGLT2 Inhibitors Passed - 03/23/2024  1:43 PM      Passed - Cr in normal range and within 360 days    Creat  Date Value Ref Range Status  11/27/2023 0.67 0.50 - 1.05 mg/dL Final   Creatinine, Ser  Date Value Ref Range Status  03/15/2024 0.71 0.44 - 1.00 mg/dL Final   Creatinine, Urine  Date Value Ref Range Status  11/27/2023 85 20 - 275 mg/dL Final         Passed - HBA1C is between 0 and 7.9 and within 180 days    Hemoglobin A1C  Date Value Ref Range Status  01/06/2017 8.0  Final   Hgb A1c MFr Bld  Date Value Ref Range Status  11/27/2023 6.4 (H) <5.7 % of total Hgb Final    Comment:    For someone without known diabetes, a hemoglobin  A1c value between 5.7% and 6.4% is consistent with prediabetes and should be confirmed with a  follow-up test. . For someone with known diabetes, a value <7% indicates that their diabetes is well controlled. A1c targets should be  individualized based on duration of diabetes, age, comorbid conditions, and other considerations. . This assay result is consistent with an increased risk of diabetes. . Currently, no consensus exists regarding use of hemoglobin A1c for diagnosis of diabetes for children. .          Passed - eGFR in normal range and within 360 days    GFR, Est African American  Date Value Ref Range Status  03/22/2019 102 > OR = 60 mL/min/1.16m2 Final   GFR, Est Non African American  Date Value Ref Range Status  03/22/2019 88 > OR = 60 mL/min/1.7m2 Final   GFR, Estimated  Date Value Ref Range Status  03/15/2024 >60 >60 mL/min Final    Comment:    (NOTE) Calculated using the CKD-EPI Creatinine Equation (2021)    eGFR  Date Value Ref Range Status  11/27/2023 96 > OR = 60 mL/min/1.62m2 Final         Passed - Valid encounter within last 6 months    Recent Outpatient Visits           2 weeks ago Bilateral primary osteoarthritis of knee   [place] [facility] [place], Angeline ORN, NP   3 months ago Urinary urgency   [place] [facility] [place], [place] W, NP   3 months ago DM type 2 with diabetic peripheral neuropathy [facility])   [place] [facility] [place], Angeline ORN, NP              Refused Prescriptions Disp Refills   carvedilol  (COREG ) 25 MG tablet [Pharmacy Med Name: Carvedilol  25 MG Oral Tablet] 60 tablet     Sig: TAKE 1 TABLET BY MOUTH TWICE DAILY WITH MEALS . APPOINTMENT REQUIRED FOR FUTURE REFILLS     Cardiovascular: Beta Blockers 3 Failed - 03/23/2024  1:43 PM      Failed - Last BP in normal range    BP Readings from Last 1 Encounters:  03/15/24 (!) 145/57         Passed - Cr in normal range and within 360 days    Creat  Date Value Ref Range Status  11/27/2023 0.67 0.50 - 1.05 mg/dL Final   Creatinine, Ser  Date Value Ref Range Status  03/15/2024 0.71 0.44 -  1.00 mg/dL Final   Creatinine, Urine  Date Value Ref  Range Status  11/27/2023 85 20 - 275 mg/dL Final         Passed - AST in normal range and within 360 days    AST  Date Value Ref Range Status  03/01/2024 15 15 - 41 U/L Final         Passed - ALT in normal range and within 360 days    ALT  Date Value Ref Range Status  03/01/2024 14 0 - 44 U/L Final         Passed - Last Heart Rate in normal range    Pulse Readings from Last 1 Encounters:  03/15/24 66         Passed - Valid encounter within last 6 months    Recent Outpatient Visits           2 weeks ago Bilateral primary osteoarthritis of knee   [place] [facility] [place], Angeline ORN, NP   3 months ago Urinary urgency   [place] [facility] [place], [place] W, NP   3 months ago DM type 2 with diabetic peripheral neuropathy [facility])   [place] [facility] [place], Angeline ORN, [place]

## 2024-03-23 NOTE — Telephone Encounter (Signed)
 Requested Prescriptions  Pending Prescriptions Disp Refills   carvedilol  (COREG ) 25 MG tablet [Pharmacy Med Name: Carvedilol  25 MG Oral Tablet] 60 tablet     Sig: TAKE 1 TABLET BY MOUTH TWICE DAILY WITH MEALS . APPOINTMENT REQUIRED FOR FUTURE REFILLS     Cardiovascular: Beta Blockers 3 Failed - 03/23/2024  1:42 PM      Failed - Last BP in normal range    BP Readings from Last 1 Encounters:  03/15/24 (!) 145/57         Passed - Cr in normal range and within 360 days    Creat  Date Value Ref Range Status  11/27/2023 0.67 0.50 - 1.05 mg/dL Final   Creatinine, Ser  Date Value Ref Range Status  03/15/2024 0.71 0.44 - 1.00 mg/dL Final   Creatinine, Urine  Date Value Ref Range Status  11/27/2023 85 20 - 275 mg/dL Final         Passed - AST in normal range and within 360 days    AST  Date Value Ref Range Status  03/01/2024 15 15 - 41 U/L Final         Passed - ALT in normal range and within 360 days    ALT  Date Value Ref Range Status  03/01/2024 14 0 - 44 U/L Final         Passed - Last Heart Rate in normal range    Pulse Readings from Last 1 Encounters:  03/15/24 66         Passed - Valid encounter within last 6 months    Recent Outpatient Visits           2 weeks ago Bilateral primary osteoarthritis of knee   [place] [facility] [place], Angeline ORN, NP   3 months ago Urinary urgency   [place] [facility] [place], [place] W, NP   3 months ago DM type 2 with diabetic peripheral neuropathy [facility])   [place] [facility] [place], [place] W, NP               tirzepatide  (MOUNJARO ) 5 MG/0.5ML Pen [Pharmacy Med Name: Mounjaro  5 MG/0.5ML Subcutaneous Solution Pen-injector] 12 mL     Sig: INJECT ONE SYRINGEFUL (5MG ) INTO THE SKIN ONCE WEEKLY     Off-Protocol Failed - 03/23/2024  1:42 PM      Failed - Medication not assigned to a protocol, review manually.      Passed - Valid encounter within last 12 months    Recent  Outpatient Visits           2 weeks ago Bilateral primary osteoarthritis of knee   [place] [facility] [place], Angeline ORN, NP   3 months ago Urinary urgency   [place] [facility] [place], [place] W, NP   3 months ago DM type 2 with diabetic peripheral neuropathy [facility])   [place] [facility] [place], Angeline ORN, NP               JANUVIA  100 MG tablet [Pharmacy Med Name: Januvia  100 MG Oral Tablet] 90 tablet 0    Sig: Take 1 tablet by mouth once daily     Endocrinology:  Diabetes - DPP-4 Inhibitors Passed - 03/23/2024  1:42 PM      Passed - HBA1C is between 0 and 7.9 and within 180 days    Hemoglobin A1C  Date Value Ref Range Status  01/06/2017 8.0  Final   Hgb A1c MFr Bld  Date Value Ref Range Status  11/27/2023 6.4 (H) <5.7 % of total Hgb Final    Comment:    For someone without known diabetes, a hemoglobin  A1c value between 5.7% and 6.4% is consistent with prediabetes and should be confirmed with a  follow-up test. . For someone with known diabetes, a value <7% indicates that their diabetes is well controlled. A1c targets should be individualized based on duration of diabetes, age, comorbid conditions, and other considerations. . This assay result is consistent with an increased risk of diabetes. . Currently, no consensus exists regarding use of hemoglobin A1c for diagnosis of diabetes for children. .          Passed - Cr in normal range and within 360 days    Creat  Date Value Ref Range Status  11/27/2023 0.67 0.50 - 1.05 mg/dL Final   Creatinine, Ser  Date Value Ref Range Status  03/15/2024 0.71 0.44 - 1.00 mg/dL Final   Creatinine, Urine  Date Value Ref Range Status  11/27/2023 85 20 - 275 mg/dL Final         Passed - Valid encounter within last 6 months    Recent Outpatient Visits           2 weeks ago Bilateral primary osteoarthritis of knee   [place] [facility] [place],  Angeline ORN, NP   3 months ago Urinary urgency   [place] [facility] [place], [place] W, NP   3 months ago DM type 2 with diabetic peripheral neuropathy [facility])   [place] [facility] [place], Angeline ORN, NP               JARDIANCE  25 MG TABS tablet [Pharmacy Med Name: Jardiance  25 MG Oral Tablet] 90 tablet 0    Sig: Take 1 tablet by mouth once daily     Endocrinology:  Diabetes - SGLT2 Inhibitors Passed - 03/23/2024  1:42 PM      Passed - Cr in normal range and within 360 days    Creat  Date Value Ref Range Status  11/27/2023 0.67 0.50 - 1.05 mg/dL Final   Creatinine, Ser  Date Value Ref Range Status  03/15/2024 0.71 0.44 - 1.00 mg/dL Final   Creatinine, Urine  Date Value Ref Range Status  11/27/2023 85 20 - 275 mg/dL Final         Passed - HBA1C is between 0 and 7.9 and within 180 days    Hemoglobin A1C  Date Value Ref Range Status  01/06/2017 8.0  Final   Hgb A1c MFr Bld  Date Value Ref Range Status  11/27/2023 6.4 (H) <5.7 % of total Hgb Final    Comment:    For someone without known diabetes, a hemoglobin  A1c value between 5.7% and 6.4% is consistent with prediabetes and should be confirmed with a  follow-up test. . For someone with known diabetes, a value <7% indicates that their diabetes is well controlled. A1c targets should be individualized based on duration of diabetes, age, comorbid conditions, and other considerations. . This assay result is consistent with an increased risk of diabetes. . Currently, no consensus exists regarding use of hemoglobin A1c for diagnosis of diabetes for children. .          Passed - eGFR in normal range and within 360 days    GFR, Est African American  Date Value Ref  Range Status  03/22/2019 102 > OR = 60 mL/min/1.64m2 Final   GFR, Est Non African American  Date Value Ref Range Status  03/22/2019 88 > OR = 60 mL/min/1.19m2 Final   GFR, Estimated  Date Value Ref Range Status   03/15/2024 >60 >60 mL/min Final    Comment:    (NOTE) Calculated using the CKD-EPI Creatinine Equation (2021)    eGFR  Date Value Ref Range Status  11/27/2023 96 > OR = 60 mL/min/1.45m2 Final         Passed - Valid encounter within last 6 months    Recent Outpatient Visits           2 weeks ago Bilateral primary osteoarthritis of knee   [place] [facility] [place], Angeline ORN, NP   3 months ago Urinary urgency   [place] [facility] [place], [place] W, NP   3 months ago DM type 2 with diabetic peripheral neuropathy [facility])   [place] [facility] [place], Angeline ORN, [place]

## 2024-03-24 ENCOUNTER — Encounter: Payer: Self-pay | Admitting: Orthopedic Surgery

## 2024-03-28 ENCOUNTER — Other Ambulatory Visit: Payer: Self-pay | Admitting: *Deleted

## 2024-03-28 MED ORDER — TIZANIDINE HCL 4 MG PO TABS: 4.0000 mg | ORAL_TABLET | Freq: Three times a day (TID) | ORAL | 1 refills | Status: DC | PRN

## 2024-03-29 ENCOUNTER — Other Ambulatory Visit: Payer: Self-pay | Admitting: Internal Medicine

## 2024-03-29 DIAGNOSIS — I10 Essential (primary) hypertension: Secondary | ICD-10-CM

## 2024-03-30 NOTE — Telephone Encounter (Signed)
 Requested Prescriptions  Pending Prescriptions Disp Refills   furosemide  (LASIX ) 40 MG tablet [Pharmacy Med Name: Furosemide  40 MG Oral Tablet] 90 tablet 0    Sig: Take 1 tablet by mouth once daily     Cardiovascular:  Diuretics - Loop Failed - 03/30/2024 12:27 PM      Failed - Ca in normal range and within 180 days    Calcium   Date Value Ref Range Status  03/15/2024 8.4 (L) 8.9 - 10.3 mg/dL Final         Failed - Mg Level in normal range and within 180 days    No results found for: MG       Failed - Last BP in normal range    BP Readings from Last 1 Encounters:  03/15/24 (!) 145/57         Passed - K in normal range and within 180 days    Potassium  Date Value Ref Range Status  03/15/2024 3.8 3.5 - 5.1 mmol/L Final         Passed - Na in normal range and within 180 days    Sodium  Date Value Ref Range Status  03/15/2024 136 135 - 145 mmol/L Final  11/30/2015 136 134 - 144 mmol/L Final         Passed - Cr in normal range and within 180 days    Creat  Date Value Ref Range Status  11/27/2023 0.67 0.50 - 1.05 mg/dL Final   Creatinine, Ser  Date Value Ref Range Status  03/15/2024 0.71 0.44 - 1.00 mg/dL Final   Creatinine, Urine  Date Value Ref Range Status  11/27/2023 85 20 - 275 mg/dL Final         Passed - Cl in normal range and within 180 days    Chloride  Date Value Ref Range Status  03/15/2024 108 98 - 111 mmol/L Final         Passed - Valid encounter within last 6 months    Recent Outpatient Visits           3 weeks ago Bilateral primary osteoarthritis of knee   [place] [facility] [place], Angeline ORN, NP   4 months ago Urinary urgency   [place] [facility] [place], Angeline ORN, NP   4 months ago DM type 2 with diabetic peripheral neuropathy [facility])   [place] [facility] [place], Angeline ORN, [place]

## 2024-04-04 ENCOUNTER — Encounter: Payer: Self-pay | Admitting: Student in an Organized Health Care Education/Training Program

## 2024-04-04 ENCOUNTER — Ambulatory Visit
Attending: Student in an Organized Health Care Education/Training Program | Admitting: Student in an Organized Health Care Education/Training Program

## 2024-04-04 VITALS — BP 102/58 | HR 78 | Temp 99.0°F | Resp 17 | Ht 62.0 in | Wt 214.0 lb

## 2024-04-04 DIAGNOSIS — M5416 Radiculopathy, lumbar region: Secondary | ICD-10-CM | POA: Insufficient documentation

## 2024-04-04 DIAGNOSIS — M79604 Pain in right leg: Secondary | ICD-10-CM | POA: Diagnosis not present

## 2024-04-04 MED ORDER — GABAPENTIN 300 MG PO CAPS: 300.0000 mg | ORAL_CAPSULE | Freq: Three times a day (TID) | ORAL | 1 refills | Status: DC

## 2024-04-04 MED ORDER — DICLOFENAC SODIUM 75 MG PO TBEC: 75.0000 mg | DELAYED_RELEASE_TABLET | Freq: Two times a day (BID) | ORAL | 1 refills | Status: DC | PRN

## 2024-04-04 NOTE — Progress Notes (Signed)
 PROVIDER NOTE: Interpretation of information contained herein should be left to medically-trained personnel. Specific patient instructions are provided elsewhere under Patient Instructions section of medical record. This document was created in part using AI and STT-dictation technology, any transcriptional errors that may result from this process are unintentional.  Patient: Stacy Martinez  Service: E/M   PCP: Antonette Angeline ORN, NP  DOB: October 03, 1955  DOS: 04/04/2024  Provider: Wallie Sherry, MD  MRN: [id number]  Delivery: Face-to-face  Specialty: Interventional Pain Management  Type: Established Patient  Setting: Ambulatory outpatient facility  Specialty designation: 09  Referring Prov.: Antonette Angeline ORN, NP  Location: Outpatient office facility       History of present illness (HPI) [name], a 68 y.o. year old female, is here today because of her Lumbar radiculopathy [M54.16]. Stacy Martinez's primary complain today is Hip Pain (right)  Pertinent problems: Stacy Martinez has Spondylosis without myelopathy or radiculopathy, lumbar region on their pertinent problem list.  Pain Assessment: Severity of Chronic pain is reported as a 10-Worst pain ever/10. Location: Hip Right/through right buttock to right knee. Onset: More than a month ago. Quality: Aching, Sharp. Timing: Constant. Modifying factor(s): heating pad, meds takes the edge off. Vitals:  height is 5' 2 (1.575 m) and weight is 214 lb (97.1 kg). Her temperature is 99 F (37.2 C). Her blood pressure is 102/58 (abnormal) and her pulse is 78. Her respiration is 17 and oxygen saturation is 97%.  BMI: Estimated body mass index is 39.14 kg/m as calculated from the following:   Height as of this encounter: 5' 2 (1.575 m).   Weight as of this encounter: 214 lb (97.1 kg).  Last encounter: 01/26/2024. Last procedure: 12/02/2023.  Reason for encounter:  History of Present Illness   Stacy Martinez is a 68 year old female who presents with severe right hip and leg  pain.  She has been experiencing severe pain in her right hip and leg, which began two months prior to her left knee surgery on March 14, 2024. Initially, the pain was overshadowed by generalized joint pain, but it became severe and unbearable after the surgery, particularly on March 24, 2024, when she could not bear weight on her right foot.  The pain is described as excruciating, preventing her from walking and causing her to be in tears. It radiates from her hip down to her knee but does not extend past the knee. The pain is sharp and stabbing, unlike the dull ache she experienced previously. She reports being told that her right hip was dropped during the procedure and wonders if this may have contributed to her pain.  Her current medications include gabapentin , which was increased to 300 mg in the morning, 600 mg at night, and 100 mg at lunchtime to manage neuropathy and possible sciatic pain. She also takes tramadol  and oxycodone  for pain relief, though these only provide minimal relief and cause drowsiness. Heat application provides some relief, but a lidocaine  patch was ineffective. She continues to take diclofenac .  An IM steroid injection provided some improvement, allowing her to bear a little weight on her right foot, but the pain remains debilitating. She has a history of right hip replacement surgery two and a half years ago.  Her daughter reports that stretching exercises provided temporary relief, allowing her to take a few steps without excruciating pain, but the pain returned shortly after. The pain has significantly impacted her mobility and quality of life, as she was previously doing  well post-knee surgery.        ROS  Constitutional: Denies any fever or chills Gastrointestinal: No reported hemesis, hematochezia, vomiting, or acute GI distress Musculoskeletal: low back and radiating right leg pain Neurological: No reported episodes of acute onset apraxia, aphasia, dysarthria,  agnosia, amnesia, paralysis, loss of coordination, or loss of consciousness  Medication Review  Basaglar  KwikPen, Insulin  Pen Needle, ReliOn Ultra Thin Lancets, acetaminophen , amLODipine , aspirin  EC, bisacodyl , carvedilol , diclofenac , docusate sodium , empagliflozin , enalapril , enoxaparin , fenofibrate , ferrous sulfate, furosemide , gabapentin , glucose blood, ondansetron , oxyCODONE , sitaGLIPtin , tiZANidine , tirzepatide , and traMADol   History Review  Allergy: Stacy Martinez is allergic to prednisone and metformin  and related. Drug: Stacy Martinez  reports no history of drug use. Alcohol:  reports no history of alcohol use. Tobacco:  reports that she has never smoked. She has never used smokeless tobacco. Social: Ms. Nestler  reports that she has never smoked. She has never used smokeless tobacco. She reports that she does not drink alcohol and does not use drugs. Medical:  has a past medical history of Abdominal hernia s/p mesh repair with recurrence and second repair, Allergy, Anemia, Aortic atherosclerosis (HCC), Arthritis, DDD (degenerative disc disease), lumbar, Enlarged heart, Hepatic steatosis, HFrEF (heart failure with reduced ejection fraction) (HCC), HLD (hyperlipidemia), Hypertension, LBBB (left bundle branch block), Peripartum cardiomyopathy, and T2DM (type 2 diabetes mellitus) (HCC). Surgical: Stacy Martinez  has a past surgical history that includes Wrist fracture surgery (Right, 2013); Cesarean section (N/A, 1999); Reverse shoulder arthroplasty (Right, 08/27/2020); Total hip arthroplasty (Right, 11/29/2021); Abdominal hernia repair (2001); RECURRENT ABDOMINAL HERNIA REPAIR (2006); Cesarean section (N/A, 2001); and Total knee arthroplasty (Left, 03/14/2024). Family: family history includes Cancer in her father; Diabetes in her mother; Mental illness in her son.  Laboratory Chemistry Profile   Renal Lab Results  Component Value Date   BUN 18 03/15/2024   CREATININE 0.71 03/15/2024   LABCREA 85 11/27/2023    BCR SEE NOTE: 11/27/2023   GFRAA 102 03/22/2019   GFRNONAA >60 03/15/2024    Hepatic Lab Results  Component Value Date   AST 15 03/01/2024   ALT 14 03/01/2024   ALBUMIN 3.6 03/01/2024   ALKPHOS 58 03/01/2024   LIPASE 19 12/18/2013    Electrolytes Lab Results  Component Value Date   NA 136 03/15/2024   K 3.8 03/15/2024   CL 108 03/15/2024   CALCIUM  8.4 (L) 03/15/2024    Bone No results found for: VD25OH, [id number], CI6874NY7, CI7874NY7, 25OHVITD1, 25OHVITD2, 25OHVITD3, TESTOFREE, TESTOSTERONE  Inflammation (CRP: Acute Phase) (ESR: Chronic Phase) Lab Results  Component Value Date   LATICACIDVEN 1.72 01/02/2014         Note: Above Lab results reviewed.  Recent Imaging Review  DG Knee Left Port CLINICAL DATA:  Status post total knee replacement  EXAM: PORTABLE LEFT KNEE - 1-2 VIEW  COMPARISON:  06/04/2021  FINDINGS: Left knee arthroplasty. Anterior soft tissue swelling. Small volume intra-articular gas. No hardware complication.  IMPRESSION: Expected appearance after left knee arthroplasty.  Electronically Signed   By: Rockey Kilts M.D.   On: 03/14/2024 10:44   Narrative & Impression  CLINICAL DATA:  Low back pain, no injury   EXAM: LUMBAR SPINE - COMPLETE WITH BENDING VIEWS   COMPARISON:  CT abdomen pelvis of 12/18/2013   FINDINGS: There is anterolisthesis of L5 on S1 by approximately 6 mm and anterolisthesis of L4 on L5 by approximately 9 mm. These malalignments appear to be due to significant degenerative change involving the facet joints at these levels. There is  degenerative disc disease at both L4-5 and L5-S1 levels. No compression deformity is seen. The SI joints appear corticated.   IMPRESSION: 1. Anterolisthesis of L5 on S1 and L4 on L5 as noted above most likely degenerative in origin. 2. Degenerative disc disease at both L4-5 and L5-S1 levels.     Note: Reviewed        Physical Exam  Vitals: BP (!) 102/58   Pulse  78   Temp 99 F (37.2 C)   Resp 17   Ht 5' 2 (1.575 m)   Wt 214 lb (97.1 kg)   SpO2 97%   BMI 39.14 kg/m  BMI: Estimated body mass index is 39.14 kg/m as calculated from the following:   Height as of this encounter: 5' 2 (1.575 m).   Weight as of this encounter: 214 lb (97.1 kg). Ideal: Ideal body weight: 50.1 kg (110 lb 7.2 oz) Adjusted ideal body weight: 68.9 kg (151 lb 13.9 oz) General appearance: Well nourished, well developed, and well hydrated. In no apparent acute distress Mental status: Alert, oriented x 3 (person, place, & time)       Respiratory: No evidence of acute respiratory distress Eyes: PERLA  Lumbar Spine Area Exam  Skin & Axial Inspection: No masses, redness, or swelling Alignment: Symmetrical Functional ROM: Pain restricted ROM affecting primarily the right Stability: No instability detected Muscle Tone/Strength: Functionally intact. No obvious neuro-muscular anomalies detected. Sensory (Neurological): Dermatomal pain pattern on the right Palpation: No palpable anomalies       Provocative Tests: Hyperextension/rotation test: deferred today       Lumbar quadrant test (Kemp's test): (+) on the right for foraminal stenosis Lateral bending test: (+) ipsilateral radicular pain, on the right. Positive for right-sided foraminal stenosis.   +SLR on right Gait & Posture Assessment  Ambulation: Unassisted Gait: Relatively normal for age and body habitus Posture: WNL  Lower Extremity Exam    Side: Right lower extremity  Side: Left lower extremity  Stability: No instability observed          Stability: No instability observed          Skin & Extremity Inspection: Skin color, temperature, and hair growth are WNL. No peripheral edema or cyanosis. No masses, redness, swelling, asymmetry, or associated skin lesions. No contractures.  Skin & Extremity Inspection: Evidence of prior arthroplastic surgery  Functional ROM: Pain restricted ROM for hip and knee joints           Functional ROM: Pain restricted ROM for hip and knee joints          Muscle Tone/Strength: Functionally intact. No obvious neuro-muscular anomalies detected.  Muscle Tone/Strength: Functionally intact. No obvious neuro-muscular anomalies detected.  Sensory (Neurological): Dermatomal pain pattern        Sensory (Neurological): Unimpaired        DTR: Patellar: deferred today Achilles: deferred today Plantar: deferred today  DTR: Patellar: deferred today Achilles: deferred today Plantar: deferred today  Palpation: No palpable anomalies  Palpation: No palpable anomalies   Assessment   Diagnosis  1. Lumbar radiculopathy   2. Right leg pain      Updated Problems: No problems updated.  Plan of Care  Problem-specific:  Assessment and Plan    Right-sided sciatica with neuropathic pain   Right-sided sciatica with neuropathic pain is likely exacerbated by positioning during recent left knee surgery. Pain is severe, radiating from the back to the hip, and does not extend past the knee. Bruising is noted along the path  of pain. Differential includes sciatic nerve irritation and possible muscle tightness. Increase gabapentin  dosage to manage neuropathic pain. Administer right L3 and L4 transforaminal epidural injection pending insurance approval. Provide a sample of Jornavax, a non-opioid pain medication, with instructions for dosing: 100 mg loading dose, then 50 mg every 12 hours. Prescribe gabapentin  300 mg three times a day. Schedule epidural injection for August 6th at 2:30 PM, pending insurance approval.  Chronic pain following left knee replacement   Chronic pain follows left knee replacement surgery on July 14th. Post-surgical pain is within normal healing parameters with no signs of complications such as clot formation. Pain management includes tramadol  and oxycodone , though these provide limited relief and cause sedation and constipation. Heat application provides some relief. Continue  current pain management regimen with tramadol  and oxycodone  as needed. Continue use of heat application for pain relief.       Ms. LAMEES GABLE has a current medication list which includes the following long-term medication(s): amlodipine , carvedilol , enalapril , enoxaparin , fenofibrate , ferrous sulfate, furosemide , basaglar  kwikpen, januvia , and gabapentin .  Pharmacotherapy (Medications Ordered): Meds ordered this encounter  Medications   gabapentin  (NEURONTIN ) 300 MG capsule    Sig: Take 1 capsule (300 mg total) by mouth 3 (three) times daily.    Dispense:  180 capsule    Refill:  1   diclofenac  (VOLTAREN ) 75 MG EC tablet    Sig: Take 1 tablet (75 mg total) by mouth every 12 (twelve) hours as needed for moderate pain (pain score 4-6).    Dispense:  60 tablet    Refill:  1   Orders:  Orders Placed This Encounter  Procedures   Lumbar Transforaminal Epidural    Standing Status:   Future    Expiration Date:   07/05/2024    Scheduling Instructions:     Laterality: Right L3 and L4          Sedation:      Timeframe: As soon as schedule allows.    Where will this procedure be performed?:   [facility]    Return in about 2 days (around 04/06/2024) for Right L3 and L4 TF ESI, in clinic (PO Valium  5mg ).    Recent Visits Date Type Provider Dept  01/26/24 Office Visit Marcelino Nurse, MD [facility]  Showing recent visits within past 90 days and meeting all other requirements Today's Visits Date Type Provider Dept  04/04/24 Office Visit Marcelino Nurse, MD [facility]  Showing today's visits and meeting all other requirements Future Appointments No visits were found meeting these conditions. Showing future appointments within next 90 days and meeting all other requirements  I discussed the assessment and treatment plan with the patient. The patient was provided an opportunity to ask questions and all were answered. The patient agreed with the plan and  demonstrated an understanding of the instructions.  Patient advised to call back or seek an in-person evaluation if the symptoms or condition worsens.  Duration of encounter: .  Total time on encounter, as per AMA guidelines included both the face-to-face and non-face-to-face time personally spent by the physician and/or other qualified health care professional(s) on the day of the encounter (includes time in activities that require the physician or other qualified health care professional and does not include time in activities normally performed by clinical staff). Physician's time may include the following activities when performed: Preparing to see the patient (e.g., pre-charting review of records, searching for previously ordered imaging, lab work, and nerve  conduction tests) Review of prior analgesic pharmacotherapies. Reviewing PMP Interpreting ordered tests (e.g., lab work, imaging, nerve conduction tests) Performing post-procedure evaluations, including interpretation of diagnostic procedures Obtaining and/or reviewing separately obtained history Performing a medically appropriate examination and/or evaluation Counseling and educating the patient/family/caregiver Ordering medications, tests, or procedures Referring and communicating with other health care professionals (when not separately reported) Documenting clinical information in the electronic or other health record Independently interpreting results (not separately reported) and communicating results to the patient/ family/caregiver Care coordination (not separately reported)  Note by: Wallie Sherry, MD (TTS and AI technology used. I apologize for any typographical errors that were not detected and corrected.) Date: 04/04/2024; Time: 3:34 PM

## 2024-04-04 NOTE — Progress Notes (Signed)
 Safety precautions to be maintained throughout the outpatient stay will include: orient to surroundings, keep bed in low position, maintain call bell within reach at all times, provide assistance with transfer out of bed and ambulation.

## 2024-04-04 NOTE — Patient Instructions (Addendum)
 Starting today reduce aspirin  81mg  to once daily in preparation for epidural procedure per Dr. Marcelino.  GENERAL RISKS AND COMPLICATIONS  What are the risk, side effects and possible complications? Generally speaking, most procedures are safe.  However, with any procedure there are risks, side effects, and the possibility of complications.  The risks and complications are dependent upon the sites that are lesioned, or the type of nerve block to be performed.  The closer the procedure is to the spine, the more serious the risks are.  Great care is taken when placing the radio frequency needles, block needles or lesioning probes, but sometimes complications can occur. Infection: Any time there is an injection through the skin, there is a risk of infection.  This is why sterile conditions are used for these blocks.  There are four possible types of infection. Localized skin infection. Central Nervous System Infection-This can be in the form of Meningitis, which can be deadly. Epidural Infections-This can be in the form of an epidural abscess, which can cause pressure inside of the spine, causing compression of the spinal cord with subsequent paralysis. This would require an emergency surgery to decompress, and there are no guarantees that the patient would recover from the paralysis. Discitis-This is an infection of the intervertebral discs.  It occurs in about 1% of discography procedures.  It is difficult to treat and it may lead to surgery.        2. Pain: the needles have to go through skin and soft tissues, will cause soreness.       3. Damage to internal structures:  The nerves to be lesioned may be near blood vessels or    other nerves which can be potentially damaged.       4. Bleeding: Bleeding is more common if the patient is taking blood thinners such as  aspirin , Coumadin, Ticiid, Plavix, etc., or if he/she have some genetic predisposition  such as hemophilia. Bleeding into the spinal canal  can cause compression of the spinal  cord with subsequent paralysis.  This would require an emergency surgery to  decompress and there are no guarantees that the patient would recover from the  paralysis.       5. Pneumothorax:  Puncturing of a lung is a possibility, every time a needle is introduced in  the area of the chest or upper back.  Pneumothorax refers to free air around the  collapsed lung(s), inside of the thoracic cavity (chest cavity).  Another two possible  complications related to a similar event would include: Hemothorax and Chylothorax.   These are variations of the Pneumothorax, where instead of air around the collapsed  lung(s), you may have blood or chyle, respectively.       6. Spinal headaches: They may occur with any procedures in the area of the spine.       7. Persistent CSF (Cerebro-Spinal Fluid) leakage: This is a rare problem, but may occur  with prolonged intrathecal or epidural catheters either due to the formation of a fistulous  track or a dural tear.       8. Nerve damage: By working so close to the spinal cord, there is always a possibility of  nerve damage, which could be as serious as a permanent spinal cord injury with  paralysis.       9. Death:  Although rare, severe deadly allergic reactions known as Anaphylactic  reaction can occur to any of the medications used.  10. Worsening of the symptoms:  We can always make thing worse.  What are the chances of something like this happening? Chances of any of this occuring are extremely low.  By statistics, you have more of a chance of getting killed in a motor vehicle accident: while driving to the hospital than any of the above occurring .  Nevertheless, you should be aware that they are possibilities.  In general, it is similar to taking a shower.  Everybody knows that you can slip, hit your head and get killed.  Does that mean that you should not shower again?  Nevertheless always keep in mind that statistics do not  mean anything if you happen to be on the wrong side of them.  Even if a procedure has a 1 (one) in a 1,000,000 (million) chance of going wrong, it you happen to be that one..Also, keep in mind that by statistics, you have more of a chance of having something go wrong when taking medications.  Who should not have this procedure? If you are on a blood thinning medication (e.g. Coumadin, Plavix, see list of Blood Thinners), or if you have an active infection going on, you should not have the procedure.  If you are taking any blood thinners, please inform your physician.  How should I prepare for this procedure? Do not eat or drink anything at least six hours prior to the procedure. Bring a driver with you .  It cannot be a taxi. Come accompanied by an adult that can drive you back, and that is strong enough to help you if your legs get weak or numb from the local anesthetic. Take all of your medicines the morning of the procedure with just enough water to swallow them. If you have diabetes, make sure that you are scheduled to have your procedure done first thing in the morning, whenever possible. If you have diabetes, take only half of your insulin  dose and notify our nurse that you have done so as soon as you arrive at the clinic. If you are diabetic, but only take blood sugar pills (oral hypoglycemic), then do not take them on the morning of your procedure.  You may take them after you have had the procedure. Do not take aspirin  or any aspirin -containing medications, at least eleven (11) days prior to the procedure.  They may prolong bleeding. Wear loose fitting clothing that may be easy to take off and that you would not mind if it got stained with Betadine or blood. Do not wear any jewelry or perfume Remove any nail coloring.  It will interfere with some of our monitoring equipment.  NOTE: Remember that this is not meant to be interpreted as a complete list of all possible complications.   Unforeseen problems may occur.  BLOOD THINNERS The following drugs contain aspirin  or other products, which can cause increased bleeding during surgery and should not be taken for 2 weeks prior to and 1 week after surgery.  If you should need take something for relief of minor pain, you may take acetaminophen  which is found in Tylenol ,m Datril, Anacin-3 and Panadol. It is not blood thinner. The products listed below are.  Do not take any of the products listed below in addition to any listed on your instruction sheet.  A.P.C or A.P.C with Codeine Codeine Phosphate Capsules #3 Ibuprofen Ridaura  ABC compound Congesprin Imuran rimadil  Advil Cope Indocin Robaxisal  Alka-Seltzer Effervescent Pain Reliever and Antacid Coricidin or Coricidin-D  Indomethacin  Rufen  Alka-Seltzer plus Cold Medicine Cosprin Ketoprofen S-A-C Tablets  Anacin Analgesic Tablets or Capsules Coumadin Korlgesic Salflex  Anacin Extra Strength Analgesic tablets or capsules CP-2 Tablets Lanoril Salicylate  Anaprox Cuprimine Capsules Levenox Salocol  Anexsia-D Dalteparin Magan Salsalate  Anodynos Darvon compound Magnesium Salicylate Sine-off  Ansaid Dasin Capsules Magsal Sodium Salicylate  Anturane Depen Capsules Marnal Soma  APF Arthritis pain formula Dewitt's Pills Measurin  Stanback  Argesic Dia-Gesic Meclofenamic Sulfinpyrazone  Arthritis Bayer Timed Release Aspirin  Diclofenac  Meclomen Sulindac  Arthritis pain formula Anacin Dicumarol Medipren Supac  Analgesic (Safety coated) Arthralgen Diffunasal Mefanamic Suprofen  Arthritis Strength Bufferin Dihydrocodeine Mepro Compound Suprol  Arthropan liquid Dopirydamole Methcarbomol with Aspirin  Synalgos  ASA tablets/Enseals Disalcid Micrainin Tagament  Ascriptin Doan's Midol Talwin  Ascriptin A/D Dolene Mobidin Tanderil  Ascriptin Extra Strength Dolobid Moblgesic Ticlid  Ascriptin with Codeine Doloprin or Doloprin with Codeine Momentum Tolectin  Asperbuf Duoprin Mono-gesic  Trendar  Aspergum Duradyne Motrin or Motrin IB Triminicin  Aspirin  plain, buffered or enteric coated Durasal Myochrisine Trigesic  Aspirin  Suppositories Easprin Nalfon Trillsate  Aspirin  with Codeine Ecotrin Regular or Extra Strength Naprosyn Uracel  Atromid-S Efficin Naproxen Ursinus  Auranofin Capsules Elmiron Neocylate Vanquish  Axotal Emagrin Norgesic Verin  Azathioprine Empirin or Empirin with Codeine Normiflo Vitamin E  Azolid Emprazil Nuprin Voltaren   Bayer Aspirin  plain, buffered or children's or timed BC Tablets or powders Encaprin Orgaran Warfarin Sodium  Buff-a-Comp Enoxaparin  Orudis Zorpin  Buff-a-Comp with Codeine Equegesic Os-Cal-Gesic   Buffaprin Excedrin plain, buffered or Extra Strength Oxalid   Bufferin Arthritis Strength Feldene Oxphenbutazone   Bufferin plain or Extra Strength Feldene Capsules Oxycodone  with Aspirin    Bufferin with Codeine Fenoprofen Fenoprofen Pabalate or Pabalate-SF   Buffets II Flogesic Panagesic   Buffinol plain or Extra Strength Florinal or Florinal with Codeine Panwarfarin   Buf-Tabs Flurbiprofen Penicillamine   Butalbital Compound Four-way cold tablets Penicillin   Butazolidin Fragmin Pepto-Bismol   Carbenicillin Geminisyn Percodan   Carna Arthritis Reliever Geopen Persantine   Carprofen Gold's salt Persistin   Chloramphenicol Goody's Phenylbutazone   Chloromycetin Haltrain Piroxlcam   Clmetidine heparin Plaquenil   Cllnoril Hyco-pap Ponstel   Clofibrate Hydroxy chloroquine Propoxyphen         Before stopping any of these medications, be sure to consult the physician who ordered them.  Some, such as Coumadin (Warfarin) are ordered to prevent or treat serious conditions such as deep thrombosis, pumonary embolisms, and other heart problems.  The amount of time that you may need off of the medication may also vary with the medication and the reason for which you were taking it.  If you are taking any of these medications, please make sure  you notify your pain physician before you undergo any procedures.         Moderate Conscious Sedation, Adult Sedation is the use of medicines to help you relax and not feel pain. Moderate conscious sedation is a type of sedation that makes you less alert than normal. You are still able to respond to instructions, touch, or both. This type of sedation is used during short medical and dental procedures. It is milder than deep sedation, which is a type of sedation you cannot be easily woken up from. It is also milder than general anesthesia, which is the use of medicines to make you fall asleep. Moderate conscious sedation lets you return to your normal activities sooner. Tell a health care provider about: Any allergies you have. All medicines you are taking, including vitamins, herbs,  steroids, eye drops, creams, and over-the-counter medicines. Any problems you or family members have had with anesthesia. Any bleeding problems you have. Any surgeries you have had. Any medical conditions you have. Whether you are pregnant or may be pregnant. Any recent alcohol, tobacco, or drug use. What are the risks? Your health care provider will talk with you about risks. These may include: Oversedation. This is when you get too much medicine. Nausea or vomiting. Allergic reaction to medicines. Trouble breathing. If this happens, a breathing tube may be used. It will be removed when you can breathe better on your own. Heart trouble. Lung trouble. Emergence delirium. This is when you feel confused while the sedation wears off. This gets better with time. What happens before the procedure? When to stop eating and drinking Follow instructions from your health care provider about what you may eat and drink. These may include: 8 hours before your procedure Stop eating most foods. Do not eat meat, fried foods, or fatty foods. Eat only light foods, such as toast or crackers. All liquids are okay except  energy drinks and alcohol. 6 hours before your procedure Stop eating. Drink only clear liquids, such as water, clear fruit juice, black coffee, plain tea, and sports drinks. Do not drink energy drinks or alcohol. 2 hours before your procedure Stop drinking all liquids. You may be allowed to take medicines with small sips of water. If you do not follow your health care provider's instructions, your procedure may be delayed or canceled. Medicines Ask your health care provider about: Changing or stopping your regular medicines. These include any diabetes medicines or blood thinners you take. Taking medicines such as aspirin  and ibuprofen. These medicines can thin your blood. Do not take them unless your health care provider tells you to. Taking over-the-counter medicines, vitamins, herbs, and supplements. Tests and exams You may have an exam or testing. You may have a blood or urine sample taken. General instructions Do not use any products that contain nicotine or tobacco for at least 4 weeks before the procedure. These products include cigarettes, chewing tobacco, and vaping devices, such as e-cigarettes. If you need help quitting, ask your health care provider. If you will be going home right after the procedure, plan to have a responsible adult: Take you home from the hospital or clinic. You will not be allowed to drive. Care for you for the time you are told. What happens during the procedure?  You will be given the sedative. It may be given: As a pill you can take by mouth. It can also be put into the rectum. As a spray through the nose. As an injection into muscle. As an injection into a vein through an IV. You may be given oxygen as needed. Your blood pressure, heart rate, breathing rate, and blood oxygen level will be monitored during the procedure. The medical or dental procedure will be done. The procedure may vary among health care providers and hospitals. What happens after  the procedure? Your blood pressure, heart rate, breathing rate, and blood oxygen level will be monitored until you leave the hospital or clinic. You will get fluids through an IV as needed. Do not drive or operate machinery until your health care provider says that it is safe. This information is not intended to replace advice given to you by your health care provider. Make sure you discuss any questions you have with your health care provider. Document Revised: 03/03/2022 Document Reviewed: 03/03/2022 Elsevier Patient Education  2024 [employer]Epidural Steroid Injection Patient Information  Description: The epidural space surrounds the nerves as they exit the spinal cord.  In some patients, the nerves can be compressed and inflamed by a bulging disc or a tight spinal canal (spinal stenosis).  By injecting steroids into the epidural space, we can bring irritated nerves into direct contact with a potentially helpful medication.  These steroids act directly on the irritated nerves and can reduce swelling and inflammation which often leads to decreased pain.  Epidural steroids may be injected anywhere along the spine and from the neck to the low back depending upon the location of your pain.   After numbing the skin with local anesthetic (like Novocaine), a small needle is passed into the epidural space slowly.  You may experience a sensation of pressure while this is being done.  The entire block usually last less than 10 minutes.  Conditions which may be treated by epidural steroids:  Low back and leg pain Neck and arm pain Spinal stenosis Post-laminectomy syndrome Herpes zoster (shingles) pain Pain from compression fractures  Preparation for the injection:  Do not eat any solid food or dairy products within 8 hours of your appointment.  You may drink clear liquids up to 3 hours before appointment.  Clear liquids include water, black coffee, juice or soda.  No milk or cream please. You  may take your regular medication, including pain medications, with a sip of water before your appointment  Diabetics should hold regular insulin  (if taken separately) and take 1/2 normal NPH dos the morning of the procedure.  Carry some sugar containing items with you to your appointment. A driver must accompany you and be prepared to drive you home after your procedure.  Bring all your current medications with your. An IV may be inserted and sedation may be given at the discretion of the physician.   A blood pressure cuff, EKG and other monitors will often be applied during the procedure.  Some patients may need to have extra oxygen administered for a short period. You will be asked to provide medical information, including your allergies, prior to the procedure.  We must know immediately if you are taking blood thinners (like Coumadin/Warfarin)  Or if you are allergic to IV iodine contrast (dye). We must know if you could possible be pregnant.  Possible side-effects: Bleeding from needle site Infection (rare, may require surgery) Nerve injury (rare) Numbness & tingling (temporary) Difficulty urinating (rare, temporary) Spinal headache ( a headache worse with upright posture) Light -headedness (temporary) Pain at injection site (several days) Decreased blood pressure (temporary) Weakness in arm/leg (temporary) Pressure sensation in back/neck (temporary)  Call if you experience: Fever/chills associated with headache or increased back/neck pain. Headache worsened by an upright position. New onset weakness or numbness of an extremity below the injection site Hives or difficulty breathing (go to the emergency room) Inflammation or drainage at the infection site Severe back/neck pain Any new symptoms which are concerning to you  Please note:  Although the local anesthetic injected can often make your back or neck feel good for several hours after the injection, the pain will likely return.   It takes 3-7 days for steroids to work in the epidural space.  You may not notice any pain relief for at least that one week.  If effective, we will often do a series of three injections spaced 3-6 weeks apart to maximally decrease your pain.  After the initial series, we generally will wait  several months before considering a repeat injection of the same type.  If you have any questions, please call [phone] [facility]

## 2024-04-07 ENCOUNTER — Ambulatory Visit
Admission: RE | Admit: 2024-04-07 | Discharge: 2024-04-07 | Disposition: A | Discharge status: Home / Self Care | Source: Ambulatory Visit | Attending: Student in an Organized Health Care Education/Training Program | Admitting: Student in an Organized Health Care Education/Training Program

## 2024-04-07 ENCOUNTER — Ambulatory Visit (HOSPITAL_BASED_OUTPATIENT_CLINIC_OR_DEPARTMENT_OTHER): Admitting: Student in an Organized Health Care Education/Training Program

## 2024-04-07 ENCOUNTER — Encounter: Payer: Self-pay | Admitting: Student in an Organized Health Care Education/Training Program

## 2024-04-07 DIAGNOSIS — M79604 Pain in right leg: Secondary | ICD-10-CM | POA: Insufficient documentation

## 2024-04-07 DIAGNOSIS — M5416 Radiculopathy, lumbar region: Secondary | ICD-10-CM | POA: Diagnosis not present

## 2024-04-07 MED ORDER — MIDAZOLAM HCL 2 MG/2ML IJ SOLN
INTRAMUSCULAR | Status: AC
  Filled 2024-04-07: qty 2

## 2024-04-07 MED ORDER — LIDOCAINE HCL (PF) 2 % IJ SOLN
INTRAMUSCULAR | Status: AC
  Filled 2024-04-07: qty 10

## 2024-04-07 MED ORDER — IOHEXOL 180 MG/ML  SOLN
10.0000 mL | Freq: Once | INTRAMUSCULAR | Status: AC
  Administered 2024-04-07: 10 mL via EPIDURAL
  Filled 2024-04-07: qty 20

## 2024-04-07 MED ORDER — ROPIVACAINE HCL 2 MG/ML IJ SOLN
2.0000 mL | Freq: Once | INTRAMUSCULAR | Status: AC
  Administered 2024-04-07: 20 mL via EPIDURAL
  Filled 2024-04-07: qty 20

## 2024-04-07 MED ORDER — DEXAMETHASONE SODIUM PHOSPHATE 10 MG/ML IJ SOLN
20.0000 mg | Freq: Once | INTRAMUSCULAR | Status: AC
  Administered 2024-04-07: 10 mg
  Filled 2024-04-07: qty 2

## 2024-04-07 MED ORDER — SODIUM CHLORIDE 0.9% FLUSH
2.0000 mL | Freq: Once | INTRAVENOUS | Status: AC
  Administered 2024-04-07: 10 mL

## 2024-04-07 MED ORDER — LIDOCAINE HCL 2 % IJ SOLN
20.0000 mL | Freq: Once | INTRAMUSCULAR | Status: AC
  Administered 2024-04-07: 100 mg
  Filled 2024-04-07: qty 20

## 2024-04-07 MED ORDER — ROPIVACAINE HCL 2 MG/ML IJ SOLN
INTRAMUSCULAR | Status: AC
  Filled 2024-04-07: qty 20

## 2024-04-07 MED ORDER — DIAZEPAM 5 MG PO TABS
ORAL_TABLET | ORAL | Status: AC
  Filled 2024-04-07: qty 1

## 2024-04-07 MED ORDER — MIDAZOLAM HCL 2 MG/2ML IJ SOLN: 0.5000 mg | Freq: Once | INTRAMUSCULAR | Status: DC

## 2024-04-07 MED ORDER — LACTATED RINGERS IV SOLN: Freq: Once | INTRAVENOUS | Status: DC

## 2024-04-07 NOTE — Patient Instructions (Signed)
____________________________________________________________________________________________  Post-Procedure Discharge Instructions  Instructions:  Apply ice:   Purpose: This will minimize any swelling and discomfort after procedure.   When: Day of procedure, as soon as you get home.  How: Fill a plastic sandwich bag with crushed ice. Cover it with a small towel and apply to injection site.  How long: (15 min on, 15 min off) Apply for 15 minutes then remove x 15 minutes.  Repeat sequence on day of procedure, until you go to bed.  Apply heat:   Purpose: To treat any soreness and discomfort from the procedure.  When: Starting the next day after the procedure.  How: Apply heat to procedure site starting the day following the procedure.  How long: May continue to repeat daily, until discomfort goes away.  Food intake: Start with clear liquids (like water) and advance to regular food, as tolerated.   Physical activities: Keep activities to a minimum for the first 8 hours after the procedure. After that, then as tolerated.  Driving: If you have received any sedation, be responsible and do not drive. You are not allowed to drive for 24 hours after having sedation.  Blood thinner: (Applies only to those taking blood thinners) You may restart your blood thinner 6 hours after your procedure.  Insulin: (Applies only to Diabetic patients taking insulin) As soon as you can eat, you may resume your normal dosing schedule.  Infection prevention: Keep procedure site clean and dry. Shower daily and clean area with soap and water.  Post-procedure Pain Diary: Extremely important that this be done correctly and accurately. Recorded information will be used to determine the next step in treatment. For the purpose of accuracy, follow these rules:  Evaluate only the area treated. Do not report or include pain from an untreated area. For the purpose of this evaluation, ignore all other areas of pain,  except for the treated area.  After your procedure, avoid taking a long nap and attempting to complete the pain diary after you wake up. Instead, set your alarm clock to go off every hour, on the hour, for the initial 8 hours after the procedure. Document the duration of the numbing medicine, and the relief you are getting from it.  Do not go to sleep and attempt to complete it later. It will not be accurate. If you received sedation, it is likely that you were given a medication that may cause amnesia. Because of this, completing the diary at a later time may cause the information to be inaccurate. This information is needed to plan your care.  Follow-up appointment: Keep your post-procedure follow-up evaluation appointment after the procedure (usually 2 weeks for most procedures, 6 weeks for radiofrequencies). DO NOT FORGET to bring you pain diary with you.   Expect: (What should I expect to see with my procedure?)  From numbing medicine (AKA: Local Anesthetics): Numbness or decrease in pain. You may also experience some weakness, which if present, could last for the duration of the local anesthetic.  Onset: Full effect within 15 minutes of injected.  Duration: It will depend on the type of local anesthetic used. On the average, 1 to 8 hours.   From steroids (Applies only if steroids were used): Decrease in swelling or inflammation. Once inflammation is improved, relief of the pain will follow.  Onset of benefits: Depends on the amount of swelling present. The more swelling, the longer it will take for the benefits to be seen. In some cases, up to 10 days.    Duration: Steroids will stay in the system x 2 weeks. Duration of benefits will depend on multiple posibilities including persistent irritating factors.  Side-effects: If present, they may typically last 2 weeks (the duration of the steroids).  Frequent: Cramps (if they occur, drink Gatorade and take over-the-counter Magnesium 450-500 mg  once to twice a day); water retention with temporary weight gain; increases in blood sugar; decreased immune system response; increased appetite.  Occasional: Facial flushing (red, warm cheeks); mood swings; menstrual changes.  Uncommon: Long-term decrease or suppression of natural hormones; bone thinning. (These are more common with higher doses or more frequent use. This is why we prefer that our patients avoid having any injection therapies in other practices.)   Very Rare: Severe mood changes; psychosis; aseptic necrosis.  From procedure: Some discomfort is to be expected once the numbing medicine wears off. This should be minimal if ice and heat are applied as instructed.  Call if: (When should I call?)  You experience numbness and weakness that gets worse with time, as opposed to wearing off.  New onset bowel or bladder incontinence. (Applies only to procedures done in the spine)  Emergency Numbers:  Durning business hours (Monday - Thursday, 8:00 AM - 4:00 PM) (Friday, 9:00 AM - 12:00 Noon): [phone]  After hours: [phone]  NOTE: If you are having a problem and are unable connect with, or to talk to a provider, then go to your nearest urgent care or emergency department. If the problem is serious and urgent, please call 911. ____________________________________________________________________________________________    ______________________________________________________________________________________________  Specialty Pain Scale  Introduction:  There are significant differences in how pain is reported. The word pain usually refers to physical pain, but it is also a common synonym of suffering. The medical community uses a scale from 0 (zero) to 10 (ten) to report pain level. Zero (0) is described as "no pain", while ten (10) is described as "the worse pain you can imagine". The problem with this scale is that physical pain is reported along with suffering.  Suffering refers to mental pain, or more often yet it refers to any unpleasant feeling, emotion or aversion associated with the perception of harm or threat of harm. It is the psychological component of pain.  Pain Specialists prefer to separate the two components. The pain scale used by this practice is the Verbal Numerical Rating Scale (VNRS-11). This scale is for the physical pain only. DO NOT INCLUDE how your pain psychologically affects you. This scale is for adults 21 years of age and older. It has 11 (eleven) levels. The 1st level is 0/10. This means: "right now, I have no pain". In the context of pain management, it also means: "right now, my physical pain is under control with the current therapy".  General Information:  The scale should reflect your current level of pain. Unless you are specifically asked for the level of your worst pain, or your average pain. If you are asked for one of these two, then it should be understood that it is over the past 24 hours.  Levels 1 (one) through 5 (five) are described below, and can be treated as an outpatient. Ambulatory pain management facilities such as ours are more than adequate to treat these levels. Levels 6 (six) through 10 (ten) are also described below, however, these must be treated as a hospitalized patient. While levels 6 (six) and 7 (seven) may be evaluated at an urgent care facility, levels 8 (eight) through 10 (ten)   constitute medical emergencies and as such, they belong in a hospital's emergency department. When having these levels (as described below), do not come to our office. Our facility is not equipped to manage these levels. Go directly to an urgent care facility or an emergency department to be evaluated.  Definitions:  Activities of Daily Living (ADL): Activities of daily living (ADL or ADLs) is a term used in healthcare to refer to people's daily self-care activities. Health professionals often use a person's ability or inability  to perform ADLs as a measurement of their functional status, particularly in regard to people post injury, with disabilities and the elderly. There are two ADL levels: Basic and Instrumental. Basic Activities of Daily Living (BADL  or BADLs) consist of self-care tasks that include: Bathing and showering; personal hygiene and grooming (including brushing/combing/styling hair); dressing; Toilet hygiene (getting to the toilet, cleaning oneself, and getting back up); eating and self-feeding (not including cooking or chewing and swallowing); functional mobility, often referred to as "transferring", as measured by the ability to walk, get in and out of bed, and get into and out of a chair; the broader definition (moving from one place to another while performing activities) is useful for people with different physical abilities who are still able to get around independently. Basic ADLs include the things many people do when they get up in the morning and get ready to go out of the house: get out of bed, go to the toilet, bathe, dress, groom, and eat. On the average, loss of function typically follows a particular order. Hygiene is the first to go, followed by loss of toilet use and locomotion. The last to go is the ability to eat. When there is only one remaining area in which the person is independent, there is a 62.9% chance that it is eating and only a 3.5% chance that it is hygiene. Instrumental Activities of Daily Living (IADL or IADLs) are not necessary for fundamental functioning, but they let an individual live independently in a community. IADL consist of tasks that include: cleaning and maintaining the house; home establishment and maintenance; care of others (including selecting and supervising caregivers); care of pets; child rearing; managing money; managing financials (investments, etc.); meal preparation and cleanup; shopping for groceries and necessities; moving within the community; safety procedures  and emergency responses; health management and maintenance (taking prescribed medications); and using the telephone or other form of communication.  Instructions:  Most patients tend to report their pain as a combination of two factors, their physical pain and their psychosocial pain. This last one is also known as "suffering" and it is reflection of how physical pain affects you socially and psychologically. From now on, report them separately.  From this point on, when asked to report your pain level, report only your physical pain. Use the following table for reference.  Pain Clinic Pain Levels (0-5/10)  Pain Level Score  Description  No Pain 0   Mild pain 1 Nagging, annoying, but does not interfere with basic activities of daily living (ADL). Patients are able to eat, bathe, get dressed, toileting (being able to get on and off the toilet and perform personal hygiene functions), transfer (move in and out of bed or a chair without assistance), and maintain continence (able to control bladder and bowel functions). Blood pressure and heart rate are unaffected. A normal heart rate for a healthy adult ranges from 60 to 100 bpm (beats per minute).   Mild to moderate pain 2 Noticeable   and distracting. Impossible to hide from other people. More frequent flare-ups. Still possible to adapt and function close to normal. It can be very annoying and may have occasional stronger flare-ups. With discipline, patients may get used to it and adapt.   Moderate pain 3 Interferes significantly with activities of daily living (ADL). It becomes difficult to feed, bathe, get dressed, get on and off the toilet or to perform personal hygiene functions. Difficult to get in and out of bed or a chair without assistance. Very distracting. With effort, it can be ignored when deeply involved in activities.   Moderately severe pain 4 Impossible to ignore for more than a few minutes. With effort, patients may still be able to  manage work or participate in some social activities. Very difficult to concentrate. Signs of autonomic nervous system discharge are evident: dilated pupils (mydriasis); mild sweating (diaphoresis); sleep interference. Heart rate becomes elevated (>115 bpm). Diastolic blood pressure (lower number) rises above 100 mmHg. Patients find relief in laying down and not moving.   Severe pain 5 Intense and extremely unpleasant. Associated with frowning face and frequent crying. Pain overwhelms the senses.  Ability to do any activity or maintain social relationships becomes significantly limited. Conversation becomes difficult. Pacing back and forth is common, as getting into a comfortable position is nearly impossible. Pain wakes you up from deep sleep. Physical signs will be obvious: pupillary dilation; increased sweating; goosebumps; brisk reflexes; cold, clammy hands and feet; nausea, vomiting or dry heaves; loss of appetite; significant sleep disturbance with inability to fall asleep or to remain asleep. When persistent, significant weight loss is observed due to the complete loss of appetite and sleep deprivation.  Blood pressure and heart rate becomes significantly elevated. Caution: If elevated blood pressure triggers a pounding headache associated with blurred vision, then the patient should immediately seek attention at an urgent or emergency care unit, as these may be signs of an impending stroke.    Emergency Department Pain Levels (6-10/10)  Emergency Room Pain 6 Severely limiting. Requires emergency care and should not be seen or managed at an outpatient pain management facility. Communication becomes difficult and requires great effort. Assistance to reach the emergency department may be required. Facial flushing and profuse sweating along with potentially dangerous increases in heart rate and blood pressure will be evident.   Distressing pain 7 Self-care is very difficult. Assistance is required to  transport, or use restroom. Assistance to reach the emergency department will be required. Tasks requiring coordination, such as bathing and getting dressed become very difficult.   Disabling pain 8 Self-care is no longer possible. At this level, pain is disabling. The individual is unable to do even the most "basic" activities such as walking, eating, bathing, dressing, transferring to a bed, or toileting. Fine motor skills are lost. It is difficult to think clearly.   Incapacitating pain 9 Pain becomes incapacitating. Thought processing is no longer possible. Difficult to remember your own name. Control of movement and coordination are lost.   The worst pain imaginable 10 At this level, most patients pass out from pain. When this level is reached, collapse of the autonomic nervous system occurs, leading to a sudden drop in blood pressure and heart rate. This in turn results in a temporary and dramatic drop in blood flow to the brain, leading to a loss of consciousness. Fainting is one of the body's self defense mechanisms. Passing out puts the brain in a calmed state and causes it to shut down   for a while, in order to begin the healing process.    Summary: 1.   Refer to this scale when providing us with your pain level. 2.   Be accurate and careful when reporting your pain level. This will help with your care. 3.   Over-reporting your pain level will lead to loss of credibility. 4.   Even a level of 1/10 means that there is pain and will be treated at our facility. 5.   High, inaccurate reporting will be documented as "Symptom Exaggeration", leading to loss of credibility and suspicions of possible secondary gains such as obtaining more narcotics, or wanting to appear disabled, for fraudulent reasons. 6.   Only pain levels of 5 or below will be seen at our facility. 7.   Pain levels of 6 and above will be sent to the Emergency Department and the appointment  cancelled.  ______________________________________________________________________________________________     

## 2024-04-07 NOTE — Progress Notes (Signed)
 1510 Versed  2 mg IV

## 2024-04-07 NOTE — Progress Notes (Signed)
 PROVIDER NOTE: Interpretation of information contained herein should be left to medically-trained personnel. Specific patient instructions are provided elsewhere under Patient Instructions section of medical record. This document was created in part using STT-dictation technology, any transcriptional errors that may result from this process are unintentional.  Patient: Stacy Martinez Type: Established DOB: July 23, 1956 MRN: [id number] PCP: Antonette Angeline ORN, NP  Service: Procedure DOS: 04/07/2024 Setting: Ambulatory Location: Ambulatory outpatient facility Delivery: Face-to-face Provider: Wallie Sherry, MD Specialty: Interventional Pain Management Specialty designation: 09 Location: Outpatient facility Ref. Prov.: Sherry Wallie, MD       Interventional Therapy   Procedure: Lumbar trans-foraminal epidural steroid injection (L-TFESI) #1  Laterality: Right (-RT)  Level: L3 and L4 nerve root(s) Imaging: Fluoroscopy-guided         Anesthesia: Local anesthesia (1-2% Lidocaine ) Sedation: Minimal Sedation                       DOS: 04/07/2024  Performed by: Wallie Sherry, MD  Purpose: Diagnostic/Therapeutic Indications: Lumbar radicular pain severe enough to impact quality of life or function. 1. Lumbar radiculopathy   2. Right leg pain    NAS-11 Pain score:   Pre-procedure: 10-Worst pain ever/10   Post-procedure: 0-No pain/10     Position / Prep / Materials:  Position: Prone  Prep solution: ChloraPrep (2% chlorhexidine  gluconate and 70% isopropyl alcohol) Prep Area: Entire Posterior Lumbosacral Area.  From the lower tip of the scapula down to the tailbone and from flank to flank. Materials:  Tray: Block Needle(s):  Type: Spinal  Gauge (G): 22  Length: 5-in  Qty: 2     H&P (Pre-op Assessment):  Stacy Martinez is a 68 y.o. (year old), female patient, seen today for interventional treatment. 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 Stacy Martinez has a current medication list which includes the following prescription(s): acetaminophen , amlodipine , aspirin  ec, bisacodyl , carvedilol , diclofenac , docusate sodium , enalapril , fenofibrate , ferrous sulfate, furosemide , gabapentin , relion glucose test strips, basaglar  kwikpen, januvia , jardiance , ondansetron , ondansetron , oxycodone , relion ultra thin lancets, mounjaro , tizanidine , tramadol , ulticare mini pen needles, and enoxaparin , and the following Facility-Administered Medications: lactated ringers  and midazolam . Her primarily concern today is the Back Pain (lower)  Initial Vital Signs:  Pulse/HCG Rate: 63ECG Heart Rate: 66 Temp: 98.1 F (36.7 C) Resp: 16 BP: 137/65 SpO2: 99 %  BMI: Estimated body mass index is 39.14 kg/m as calculated from the following:   Height as of this encounter: 5' 2 (1.575 m).   Weight as of this encounter: 214 lb (97.1 kg).  Risk Assessment: Allergies: Reviewed. She is allergic to prednisone and metformin  and related.  Allergy Precautions: None required Coagulopathies: Reviewed. None identified.  Blood-thinner therapy: None at this time Active Infection(s): Reviewed. None identified. Stacy Martinez is afebrile  Site Confirmation: Stacy Martinez was asked to confirm the procedure and laterality before marking the site Procedure checklist: Completed Consent: Before the procedure and under the influence of no sedative(s), amnesic(s), or anxiolytics, the patient was informed of the treatment options, risks and possible complications. To fulfill our ethical and legal obligations, as recommended by the American Medical Association's Code of Ethics, I have informed the patient of my clinical impression; the nature and  purpose of the treatment or procedure; the risks, benefits,  and possible complications of the intervention; the alternatives, including doing nothing; the risk(s) and benefit(s) of the alternative treatment(s) or procedure(s); and the risk(s) and benefit(s) of doing nothing. The patient was provided information about the general risks and possible complications associated with the procedure. These may include, but are not limited to: failure to achieve desired goals, infection, bleeding, organ or nerve damage, allergic reactions, paralysis, and death. In addition, the patient was informed of those risks and complications associated to Spine-related procedures, such as failure to decrease pain; infection (i.e.: Meningitis, epidural or intraspinal abscess); bleeding (i.e.: epidural hematoma, subarachnoid hemorrhage, or any other type of intraspinal or peri-dural bleeding); organ or nerve damage (i.e.: Any type of peripheral nerve, nerve root, or spinal cord injury) with subsequent damage to sensory, motor, and/or autonomic systems, resulting in permanent pain, numbness, and/or weakness of one or several areas of the body; allergic reactions; (i.e.: anaphylactic reaction); and/or death. Furthermore, the patient was informed of those risks and complications associated with the medications. These include, but are not limited to: allergic reactions (i.e.: anaphylactic or anaphylactoid reaction(s)); adrenal axis suppression; blood sugar elevation that in diabetics may result in ketoacidosis or comma; water retention that in patients with history of congestive heart failure may result in shortness of breath, pulmonary edema, and decompensation with resultant heart failure; weight gain; swelling or edema; medication-induced neural toxicity; particulate matter embolism and blood vessel occlusion with resultant organ, and/or nervous system infarction; and/or aseptic necrosis of one or more joints. Finally, the patient was  informed that Medicine is not an exact science; therefore, there is also the possibility of unforeseen or unpredictable risks and/or possible complications that may result in a catastrophic outcome. The patient indicated having understood very clearly. We have given the patient no guarantees and we have made no promises. Enough time was given to the patient to ask questions, all of which were answered to the patient's satisfaction. Ms. Fiore has indicated that she wanted to continue with the procedure. Attestation: I, the ordering provider, attest that I have discussed with the patient the benefits, risks, side-effects, alternatives, likelihood of achieving goals, and potential problems during recovery for the procedure that I have provided informed consent. Date  Time: 04/07/2024  2:29 PM  Pre-Procedure Preparation:  Monitoring: As per clinic protocol. Respiration, ETCO2, SpO2, BP, heart rate and rhythm monitor placed and checked for adequate function Safety Precautions: Patient was assessed for positional comfort and pressure points before starting the procedure. Time-out: I initiated and conducted the Time-out before starting the procedure, as per protocol. The patient was asked to participate by confirming the accuracy of the Time Out information. Verification of the correct person, site, and procedure were performed and confirmed by me, the nursing staff, and the patient. Time-out conducted as per Joint Commission's Universal Protocol (UP.01.01.01). Time: 1509 Start Time: 1510 hrs.  Description/Narrative of Procedure:          Target: The 6 o'clock position under the pedicle, on the affected side. Region: Posterolateral Lumbosacral Approach: Posterior Percutaneous Paravertebral approach.  Rationale (medical necessity): procedure needed and proper for the diagnosis and/or treatment of the patient's medical symptoms and needs. Procedural Technique Safety Precautions: Aspiration looking for  blood return was conducted prior to all injections. At no point did we inject any substances, as a needle was being advanced. No attempts were made at seeking any paresthesias. Safe injection practices and needle disposal techniques used. Medications properly checked for expiration dates. SDV (single dose vial) medications used. Description  of the Procedure: Protocol guidelines were followed. The patient was placed in position over the procedure table. The target area was identified and the area prepped in the usual manner. Skin & deeper tissues infiltrated with local anesthetic. Appropriate amount of time allowed to pass for local anesthetics to take effect. The procedure needles were then advanced to the target area. Proper needle placement secured. Negative aspiration confirmed. Solution injected in intermittent fashion, asking for systemic symptoms every 0.5cc of injectate. The needles were then removed and the area cleansed, making sure to leave some of the prepping solution back to take advantage of its long term bactericidal properties.  Vitals:   04/07/24 1429 04/07/24 1511 04/07/24 1515 04/07/24 1525  BP: 137/65 (!) 141/77 (!) 141/75 (!) 146/73  Pulse: 63     Resp: 16 13 11 16   Temp: 98.1 F (36.7 C)     TempSrc: Temporal     SpO2: 99% 98% 95% 98%  Weight: 214 lb (97.1 kg)     Height: 5' 2 (1.575 m)       Start Time: 1510 hrs. End Time: 1517 hrs.  Imaging Guidance (Spinal):          Type of Imaging Technique: Fluoroscopy Guidance (Spinal) Indication(s): Fluoroscopy guidance for needle placement to enhance accuracy in procedures requiring precise needle localization for targeted delivery of medication in or near specific anatomical locations not easily accessible without such real-time imaging assistance. Exposure Time: Please see nurses notes. Contrast: Before injecting any contrast, we confirmed that the patient did not have an allergy to iodine, shellfish, or radiological contrast.  Once satisfactory needle placement was completed at the desired level, radiological contrast was injected. Contrast injected under live fluoroscopy. No contrast complications. See chart for type and volume of contrast used. Fluoroscopic Guidance: I was personally present during the use of fluoroscopy. Tunnel Vision Technique used to obtain the best possible view of the target area. Parallax error corrected before commencing the procedure. Direction-depth-direction technique used to introduce the needle under continuous pulsed fluoroscopy. Once target was reached, antero-posterior, oblique, and lateral fluoroscopic projection used confirm needle placement in all planes. Images permanently stored in EMR. Interpretation: I personally interpreted the imaging intraoperatively. Adequate needle placement confirmed in multiple planes. Appropriate spread of contrast into desired area was observed. No evidence of afferent or efferent intravascular uptake. No intrathecal or subarachnoid spread observed. Permanent images saved into the patient's record.  Post-operative Assessment:  Post-procedure Vital Signs:  Pulse/HCG Rate: 6364 Temp: 98.1 F (36.7 C) Resp: 16 BP: (!) 146/73 SpO2: 98 %  EBL: None  Complications: No immediate post-treatment complications observed by team, or reported by patient.  Note: The patient tolerated the entire procedure well. A repeat set of vitals were taken after the procedure and the patient was kept under observation following institutional policy, for this type of procedure. Post-procedural neurological assessment was performed, showing return to baseline, prior to discharge. The patient was provided with post-procedure discharge instructions, including a section on how to identify potential problems. Should any problems arise concerning this procedure, the patient was given instructions to immediately contact us , at any time, without hesitation. In any case, we plan to  contact the patient by telephone for a follow-up status report regarding this interventional procedure.  Comments:  No additional relevant information.  Plan of Care (POC)  Orders:  Orders Placed This Encounter  Procedures   DG [facility] C-ARM 1-60 MIN NO REPORT    Intraoperative interpretation by procedural physician at [facility].    Standing Status:   Standing    Number of Occurrences:   1    Reason for exam::   Assistance in needle guidance and placement for procedures requiring needle placement in or near specific anatomical locations not easily accessible without such assistance.     Medications ordered for procedure: Meds ordered this encounter  Medications   iohexol  (OMNIPAQUE ) 180 MG/ML injection 10 mL    Must be Myelogram-compatible. If not available, you may substitute with a water-soluble, non-ionic, hypoallergenic, myelogram-compatible radiological contrast medium.   lidocaine  (XYLOCAINE ) 2 % (with pres) injection 400 mg   sodium chloride  flush (NS) 0.9 % injection 2 mL    This is for a two (2) level block. Use two (2) syringes and divide content in half.   ropivacaine  (PF) 2 mg/mL (0.2%) (NAROPIN ) injection 2 mL    This is for a two (2) level block. Use two (2) syringes and divide content in half.   dexamethasone  (DECADRON ) injection 20 mg    This is for a two (2) level block. Use two (2) syringes and divide content in half.   lactated ringers  infusion   midazolam  (VERSED ) injection 0.5-2 mg    Make sure Flumazenil is available in the pyxis when using this medication. If oversedation occurs, administer 0.2 mg IV over 15 sec. If after 45 sec no response, administer 0.2 mg again over 1 min; may repeat at 1 min intervals; not to exceed 4 doses (1 mg)   Medications administered: We administered iohexol , lidocaine , sodium chloride  flush, ropivacaine  (PF) 2 mg/mL (0.2%), and dexamethasone .  See the medical record for exact dosing, route, and time of  administration.    Follow-up plan:   Return in about 4 weeks (around 05/05/2024) for PPE in person.     Recent Visits Date Type Provider Dept  04/04/24 Office Visit Marcelino Nurse, MD [facility]  01/26/24 Office Visit Marcelino Nurse, MD [facility]  Showing recent visits within past 90 days and meeting all other requirements Today's Visits Date Type Provider Dept  04/07/24 Procedure visit Marcelino Nurse, MD [facility]  Showing today's visits and meeting all other requirements Future Appointments Date Type Provider Dept  05/10/24 Appointment Marcelino Nurse, MD [facility]  Showing future appointments within next 90 days and meeting all other requirements   Disposition: Discharge home  Discharge (Date  Time): 04/07/2024; 1530 hrs.   Primary Care Physician: Antonette Angeline ORN, NP Location: [facility] Note by: Nurse Marcelino, MD (TTS technology used. I apologize for any typographical errors that were not detected and corrected.) Date: 04/07/2024; Time: 3:45 PM  Disclaimer:  Medicine is not an Visual merchandiser. The only guarantee in medicine is that nothing is guaranteed. It is important to note that the decision to proceed with this intervention was based on the information collected from the patient. The Data and conclusions were drawn from the patient's questionnaire, the interview, and the physical examination. Because the information was provided in large part by the patient, it cannot be guaranteed that it has not been purposely or unconsciously manipulated. Every effort has been made to obtain as much relevant data as possible for this evaluation. It is important to note that the conclusions that lead to this procedure are derived in large part from the available data. Always take into account that the treatment will also be dependent on availability of resources and existing treatment guidelines, considered by other Pain  Management  Practitioners as being common knowledge and practice, at the time of the intervention. For Medico-Legal purposes, it is also important to point out that variation in procedural techniques and pharmacological choices are the acceptable norm. The indications, contraindications, technique, and results of the above procedure should only be interpreted and judged by a Board-Certified Interventional Pain Specialist with extensive familiarity and expertise in the same exact procedure and technique.

## 2024-04-20 ENCOUNTER — Telehealth: Payer: Self-pay | Admitting: Student in an Organized Health Care Education/Training Program

## 2024-04-20 ENCOUNTER — Ambulatory Visit: Attending: Nurse Practitioner | Admitting: Nurse Practitioner

## 2024-04-20 ENCOUNTER — Other Ambulatory Visit: Payer: Self-pay | Admitting: Nurse Practitioner

## 2024-04-20 ENCOUNTER — Encounter: Payer: Self-pay | Admitting: Nurse Practitioner

## 2024-04-20 ENCOUNTER — Other Ambulatory Visit: Payer: Self-pay | Admitting: *Deleted

## 2024-04-20 VITALS — BP 132/64 | HR 72 | Temp 97.3°F | Ht 62.0 in | Wt 214.0 lb

## 2024-04-20 DIAGNOSIS — M170 Bilateral primary osteoarthritis of knee: Secondary | ICD-10-CM | POA: Insufficient documentation

## 2024-04-20 DIAGNOSIS — M79604 Pain in right leg: Secondary | ICD-10-CM | POA: Diagnosis not present

## 2024-04-20 DIAGNOSIS — M47816 Spondylosis without myelopathy or radiculopathy, lumbar region: Secondary | ICD-10-CM | POA: Diagnosis present

## 2024-04-20 DIAGNOSIS — G894 Chronic pain syndrome: Secondary | ICD-10-CM | POA: Insufficient documentation

## 2024-04-20 DIAGNOSIS — M5416 Radiculopathy, lumbar region: Secondary | ICD-10-CM | POA: Insufficient documentation

## 2024-04-20 MED ORDER — KETOROLAC TROMETHAMINE 60 MG/2ML IM SOLN
60.0000 mg | Freq: Once | INTRAMUSCULAR | Status: AC
  Administered 2024-04-20: 60 mg via INTRAMUSCULAR
  Filled 2024-04-20: qty 2

## 2024-04-20 MED ORDER — METHOCARBAMOL 1000 MG/10ML IJ SOLN
200.0000 mg | Freq: Once | INTRAMUSCULAR | Status: AC
  Administered 2024-04-20: 200 mg via INTRAMUSCULAR
  Filled 2024-04-20: qty 10

## 2024-04-20 NOTE — Progress Notes (Signed)
 PROVIDER NOTE: Interpretation of information contained herein should be left to medically-trained personnel. Specific patient instructions are provided elsewhere under Patient Instructions section of medical record. This document was created in part using AI and STT-dictation technology, any transcriptional errors that may result from this process are unintentional.  Patient: Stacy Martinez  Service: E/M   PCP: Antonette Angeline ORN, NP  DOB: 1955/09/06  DOS: 04/20/2024  Provider: Emmy MARLA Blanch, NP  MRN: [id number]  Delivery: Face-to-face  Specialty: Interventional Pain Management  Type: Established Patient  Setting: Ambulatory outpatient facility  Specialty designation: 09  Referring Prov.: Antonette Angeline ORN, NP  Location: Outpatient office facility       History of present illness (HPI) [name], a 68 y.o. year old female, is here today because of her Lumbar radiculopathy [M54.16]. Stacy Martinez's primary complain today is Back Pain (right)  Pertinent problems: Stacy Martinez does not have any pertinent problems on file.  Pain Assessment: Severity of Chronic pain is reported as a 10-Worst pain ever/10. Location: Back Lower, Right/pain radiaties down her right leg to her knee. Onset: More than a month ago. Quality: Aching, Burning, Shooting, Sharp. Timing: Constant. Modifying factor(s): Meds, sitting down , ice nad heat. Vitals:  height is 5' 2 (1.575 m) and weight is 214 lb (97.1 kg). Her temperature is 97.3 F (36.3 C) (abnormal). Her blood pressure is 132/64 and her pulse is 72. Her oxygen saturation is 98%.  BMI: Estimated body mass index is 39.14 kg/m as calculated from the following:   Height as of this encounter: 5' 2 (1.575 m).   Weight as of this encounter: 214 lb (97.1 kg).  Last encounter: Visit date not found. Last procedure: Visit date not found.  Reason for encounter: evaluation of worsening, or previously known (established) problem.   Stacy Martinez received a lumbar transforaminal epidural  steroid injection on April 07, 2024.  She is currently experiencing leg pain, which she attributes to the procedure.  I explained that the steroid may take 3 to 4 weeks to achieve its full therapeutic effect.  In the meantime, I recommended Toradol  and Robaxin  IM therapy for symptomatic pain relief.   Pharmacotherapy Assessment    Monitoring: [place] PMP: PDMP not reviewed this encounter.       Pharmacotherapy: No side-effects or adverse reactions reported. Compliance: No problems identified. Effectiveness: Clinically acceptable.  Stacy Dorothe LABOR, RN  04/20/2024  1:59 PM  Sign when Signing Visit Safety precautions to be maintained throughout the outpatient stay will include: orient to surroundings, keep bed in low position, maintain call bell within reach at all times, provide assistance with transfer out of bed and ambulation.     UDS:  No results found for: SUMMARY  No results found for: CBDTHCR No results found for: D8THCCBX No results found for: D9THCCBX  ROS  Constitutional: Denies any fever or chills Gastrointestinal: No reported hemesis, hematochezia, vomiting, or acute GI distress Musculoskeletal: Denies any acute onset joint swelling, redness, loss of ROM, or weakness Neurological: No reported episodes of acute onset apraxia, aphasia, dysarthria, agnosia, amnesia, paralysis, loss of coordination, or loss of consciousness  Medication Review  Basaglar  KwikPen, Insulin  Pen Needle, ReliOn Ultra Thin Lancets, acetaminophen , amLODipine , aspirin  EC, bisacodyl , carvedilol , diclofenac , docusate sodium , empagliflozin , enalapril , enoxaparin , fenofibrate , ferrous sulfate, furosemide , gabapentin , glucose blood, ondansetron , oxyCODONE , sitaGLIPtin , tiZANidine , tirzepatide , and traMADol   History Review  Allergy: Stacy Martinez is allergic to prednisone and metformin  and related. Drug: Stacy Martinez  reports no history of drug  use. Alcohol:  reports no history of alcohol use. Tobacco:  reports that  she has never smoked. She has never used smokeless tobacco. Social: Stacy Martinez  reports that she has never smoked. She has never used smokeless tobacco. She reports that she does not drink alcohol and does not use drugs. Medical:  has a past medical history of Abdominal hernia s/p mesh repair with recurrence and second repair, Allergy, Anemia, Aortic atherosclerosis (HCC), Arthritis, DDD (degenerative disc disease), lumbar, Enlarged heart, Hepatic steatosis, HFrEF (heart failure with reduced ejection fraction) (HCC), HLD (hyperlipidemia), Hypertension, LBBB (left bundle branch block), Peripartum cardiomyopathy, and T2DM (type 2 diabetes mellitus) (HCC). Surgical: Stacy Martinez  has a past surgical history that includes Wrist fracture surgery (Right, 2013); Cesarean section (N/A, 1999); Reverse shoulder arthroplasty (Right, 08/27/2020); Total hip arthroplasty (Right, 11/29/2021); Abdominal hernia repair (2001); RECURRENT ABDOMINAL HERNIA REPAIR (2006); Cesarean section (N/A, 2001); and Total knee arthroplasty (Left, 03/14/2024). Family: family history includes Cancer in her father; Diabetes in her mother; Mental illness in her son.  Laboratory Chemistry Profile   Renal Lab Results  Component Value Date   BUN 18 03/15/2024   CREATININE 0.71 03/15/2024   LABCREA 85 11/27/2023   BCR SEE NOTE: 11/27/2023   GFRAA 102 03/22/2019   GFRNONAA >60 03/15/2024    Hepatic Lab Results  Component Value Date   AST 15 03/01/2024   ALT 14 03/01/2024   ALBUMIN 3.6 03/01/2024   ALKPHOS 58 03/01/2024   LIPASE 19 12/18/2013    Electrolytes Lab Results  Component Value Date   NA 136 03/15/2024   K 3.8 03/15/2024   CL 108 03/15/2024   CALCIUM  8.4 (L) 03/15/2024    Bone No results found for: VD25OH, CI874NY7UNU, CI6874NY7, CI7874NY7, 25OHVITD1, 25OHVITD2, 25OHVITD3, TESTOFREE, TESTOSTERONE  Inflammation (CRP: Acute Phase) (ESR: Chronic Phase) Lab Results  Component Value Date   LATICACIDVEN  1.72 01/02/2014         Note: Above Lab results reviewed.  Recent Imaging Review  DG PAIN CLINIC C-ARM 1-60 MIN NO REPORT Fluoro was used, but no Radiologist interpretation will be provided.  Please refer to NOTES tab for provider progress note. Note: Reviewed        Physical Exam  Vitals: BP 132/64   Pulse 72   Temp (!) 97.3 F (36.3 C)   Ht 5' 2 (1.575 m)   Wt 214 lb (97.1 kg)   SpO2 98%   BMI 39.14 kg/m  BMI: Estimated body mass index is 39.14 kg/m as calculated from the following:   Height as of this encounter: 5' 2 (1.575 m).   Weight as of this encounter: 214 lb (97.1 kg). Ideal: Ideal body weight: 50.1 kg (110 lb 7.2 oz) Adjusted ideal body weight: 68.9 kg (151 lb 13.9 oz) General appearance: Well nourished, well developed, and well hydrated. In no apparent acute distress Mental status: Alert, oriented x 3 (person, place, & time)       Respiratory: No evidence of acute respiratory distress Eyes: PERLA   Assessment   Diagnosis Status  1. Lumbar radiculopathy   2. Right leg pain   3. Bilateral primary osteoarthritis of knee   4. Chronic pain syndrome   5. Lumbar spondylosis    Controlled Controlled Controlled   Updated Problems: No problems updated.  Plan of Care  Problem-specific:  Assessment and Plan  IM therapy in clinic : Torodol 60 mg Robaxin  200 mg    Ms. Stacy BIRCH Fosco has a current medication list which includes  the following long-term medication(s): amlodipine , carvedilol , enalapril , enoxaparin , fenofibrate , ferrous sulfate, furosemide , gabapentin , basaglar  kwikpen, and januvia .  Pharmacotherapy (Medications Ordered): Meds ordered this encounter  Medications   ketorolac  (TORADOL ) injection 60 mg   methocarbamol  (ROBAXIN ) injection 200 mg   Orders:  No orders of the defined types were placed in this encounter.     No follow-ups on file.    Recent Visits Date Type Provider Dept  04/07/24 Procedure visit Marcelino Nurse, MD  [facility]  04/04/24 Office Visit Marcelino Nurse, MD [facility]  01/26/24 Office Visit Marcelino Nurse, MD [facility]  Showing recent visits within past 90 days and meeting all other requirements Today's Visits Date Type Provider Dept  04/20/24 Office Visit Ennio Houp K, NP [facility]  Showing today's visits and meeting all other requirements Future Appointments Date Type Provider Dept  05/10/24 Appointment Marcelino Nurse, MD [facility]  Showing future appointments within next 90 days and meeting all other requirements  I discussed the assessment and treatment plan with the patient. The patient was provided an opportunity to ask questions and all were answered. The patient agreed with the plan and demonstrated an understanding of the instructions.  Patient advised to call back or seek an in-person evaluation if the symptoms or condition worsens.  Duration of encounter: 15 minutes.  Total time on encounter, as per AMA guidelines included both the face-to-face and non-face-to-face time personally spent by the physician and/or other qualified health care professional(s) on the day of the encounter (includes time in activities that require the physician or other qualified health care professional and does not include time in activities normally performed by clinical staff). Physician's time may include the following activities when performed: Preparing to see the patient (e.g., pre-charting review of records, searching for previously ordered imaging, lab work, and nerve conduction tests) Review of prior analgesic pharmacotherapies. Reviewing PMP Interpreting ordered tests (e.g., lab work, imaging, nerve conduction tests) Performing post-procedure evaluations, including interpretation of diagnostic procedures Obtaining and/or reviewing separately obtained history Performing a medically appropriate examination and/or evaluation Counseling and  educating the patient/family/caregiver Ordering medications, tests, or procedures Referring and communicating with other health care professionals (when not separately reported) Documenting clinical information in the electronic or other health record Independently interpreting results (not separately reported) and communicating results to the patient/ family/caregiver Care coordination (not separately reported)  Note by: Azazel Franze K Calee Nugent, NP (TTS and AI technology used. I apologize for any typographical errors that were not detected and corrected.) Date: 04/20/2024; Time: 2:40 PM

## 2024-04-20 NOTE — Telephone Encounter (Signed)
 Patient is still having a great deal of pain and would like to get a round of prednisone sent to her pharmacy. Please advise patient

## 2024-04-20 NOTE — Patient Instructions (Signed)
 Gave Toradol  and Methocarbamol  IM

## 2024-04-20 NOTE — Progress Notes (Signed)
 Safety precautions to be maintained throughout the outpatient stay will include: orient to surroundings, keep bed in low position, maintain call bell within reach at all times, provide assistance with transfer out of bed and ambulation.

## 2024-04-21 ENCOUNTER — Telehealth: Payer: Self-pay

## 2024-04-21 NOTE — Telephone Encounter (Signed)
 Copied from CRM [id number]. Topic: Clinical - Prescription Issue >> Apr 21, 2024  3:39 PM DeAngela L wrote: Reason for CRM:  patient says she was told her insulin  is not covered under her insurance  Insulin  Glargine (BASAGLAR  KWIKPEN) 100 UNIT/ML And now patient is calling to ask the office to confirm which insulin  medication she is using so she can submit a medication refill cause she needs a refill now but really unsure of what she is supposed to submit refill request and  Patient is out of insulin  at this time  Pt num [phone] [facility] [address] [place] - [address] [place] [address] Phone: [phone] Fax: [phone]

## 2024-04-22 ENCOUNTER — Telehealth: Payer: Self-pay

## 2024-04-22 MED ORDER — LANTUS SOLOSTAR 100 UNIT/ML ~~LOC~~ SOPN: 34.0000 [IU] | PEN_INJECTOR | Freq: Every day | SUBCUTANEOUS | 0 refills | Status: DC

## 2024-04-22 NOTE — Telephone Encounter (Signed)
 Received fax from pharmacy stating that a PA was required for Basaglar . Lantus  is the preferred. Please advise. Thanks!

## 2024-04-22 NOTE — Telephone Encounter (Signed)
 Attempted to reach patient to advise of the change below. I kept getting a message to enter VM number. Unable to connect with patient. Okay to advise below if patient calls back.

## 2024-04-22 NOTE — Addendum Note (Signed)
 Addended by: ANTONETTE ANGELINE ORN on: 04/22/2024 09:12 AM   Modules accepted: Orders

## 2024-04-22 NOTE — Telephone Encounter (Signed)
 I have sent Lantus  which is preferred in place of Basaglar  for

## 2024-04-22 NOTE — Telephone Encounter (Signed)
 There is an additional phone note about this.  See previous phone note

## 2024-04-28 ENCOUNTER — Other Ambulatory Visit: Payer: Self-pay | Admitting: Orthopedic Surgery

## 2024-04-28 DIAGNOSIS — M5416 Radiculopathy, lumbar region: Secondary | ICD-10-CM

## 2024-05-03 ENCOUNTER — Other Ambulatory Visit (HOSPITAL_COMMUNITY): Payer: Self-pay

## 2024-05-06 ENCOUNTER — Ambulatory Visit: Admission: RE | Admit: 2024-05-06 | Source: Ambulatory Visit

## 2024-05-09 ENCOUNTER — Ambulatory Visit (HOSPITAL_BASED_OUTPATIENT_CLINIC_OR_DEPARTMENT_OTHER): Admitting: Nurse Practitioner

## 2024-05-09 ENCOUNTER — Encounter: Payer: Self-pay | Admitting: Nurse Practitioner

## 2024-05-09 VITALS — BP 126/64 | HR 73 | Temp 97.3°F | Resp 18 | Ht 62.0 in | Wt 214.0 lb

## 2024-05-09 DIAGNOSIS — M47816 Spondylosis without myelopathy or radiculopathy, lumbar region: Secondary | ICD-10-CM

## 2024-05-09 DIAGNOSIS — M5416 Radiculopathy, lumbar region: Secondary | ICD-10-CM

## 2024-05-09 DIAGNOSIS — M79604 Pain in right leg: Secondary | ICD-10-CM

## 2024-05-09 DIAGNOSIS — G894 Chronic pain syndrome: Secondary | ICD-10-CM | POA: Diagnosis present

## 2024-05-09 DIAGNOSIS — M170 Bilateral primary osteoarthritis of knee: Secondary | ICD-10-CM | POA: Diagnosis present

## 2024-05-09 NOTE — Progress Notes (Signed)
 Safety precautions to be maintained throughout the outpatient stay will include: orient to surroundings, keep bed in low position, maintain call bell within reach at all times, provide assistance with transfer out of bed and ambulation.

## 2024-05-09 NOTE — Progress Notes (Signed)
 PROVIDER NOTE: Interpretation of information contained herein should be left to medically-trained personnel. Specific patient instructions are provided elsewhere under Patient Instructions section of medical record. This document was created in part using AI and STT-dictation technology, any transcriptional errors that may result from this process are unintentional.  Patient: Stacy Martinez  Service: E/M   PCP: Antonette Angeline ORN, NP  DOB: February 11, 1956  DOS: 05/09/2024  Provider: Emmy MARLA Blanch, NP  MRN: [id number]  Delivery: Face-to-face  Specialty: Interventional Pain Management  Type: Established Patient  Setting: Ambulatory outpatient facility  Specialty designation: 09  Referring Prov.: Antonette Angeline ORN, NP  Location: Outpatient office facility       History of present illness (HPI) [name], a 68 y.o. year old female, is here today because of her Lumbar radiculopathy [M54.16]. Ms. Nack's primary complain today is Back Pain  Pertinent problems: Ms. Skidgel has Spondylosis without myelopathy or radiculopathy, lumbar region, and Chronic pain syndrome on their pertinent problem list.  Pain Assessment: Severity of Chronic pain is reported as a 0-No pain/10. Location: Back Lower/None today. Onset: More than a month ago. Quality: Aching, Burning, Shooting, Sharp. Timing: Intermittent. Modifying factor(s): Medication. Vitals:  height is 5' 2 (1.575 m) and weight is 214 lb (97.1 kg). Her temporal temperature is 97.3 F (36.3 C) (abnormal). Her blood pressure is 126/64 and her pulse is 73. Her respiration is 18.  BMI: Estimated body mass index is 39.14 kg/m as calculated from the following:   Height as of this encounter: 5' 2 (1.575 m).   Weight as of this encounter: 214 lb (97.1 kg).  Last encounter: 04/20/2024. Last procedure: Visit date not found.  Reason for encounter: post-procedure evaluation and assessment.   Ms. Hlavac received a diagnostic/therapeutic Lumbar trans-foraminal epidural steroid  injection on April 07, 2024.  She reports 100% pain relief and functional improvement during local anesthetic followed by 80% sustained pain relief and functional improvement since the procedure. However, Pain returned in back and leg on right side after 1 week, had Visit with Ortho, and received Prendisone taper, and an MRI has been ordered for tomorrow 05/10/24.  Procedure Procedure: Lumbar trans-foraminal epidural steroid injection (L-TFESI) #1  Laterality: Right (-RT)  Level: L3 and L4 nerve root(s) Imaging: Fluoroscopy-guided         Anesthesia: Local anesthesia (1-2% Lidocaine ) Sedation: Minimal Sedation                       DOS: 04/07/2024  Performed by: Wallie Sherry, MD   Purpose: Diagnostic/Therapeutic Indications: Lumbar radicular pain severe enough to impact quality of life or function. 1. Lumbar radiculopathy   2. Right leg pain     NAS-11 Pain score:        Pre-procedure: 10-Worst pain ever/10        Post-procedure: 0-No pain/10   Post-Procedure Evaluation    Effectiveness:  Initial hour after procedure: 100 % . Subsequent 4-6 hours post-procedure: 100 % . Analgesia past initial 6 hours: 80 % (Pain returned in back and leg on right side after 1 week, had Visit with Ortho, and received Prendisone taper, and an MRI has been ordered for tomorrow 05/10/24.) . Ongoing improvement:  Analgesic:  Ms. Herzberg received a diagnostic/therapeutic Lumbar trans-foraminal epidural steroid injection on April 07, 2024.  She reports 100% pain relief and functional improvement during local anesthetic followed by 80% sustained pain relief and functional improvement since the procedure.  Function: Ms. Greb reports improvement in  function ROM: Ms. Spegal reports improvement in ROM  Pharmacotherapy Assessment   Monitoring: [place] PMP: PDMP reviewed during this encounter.       Pharmacotherapy: No side-effects or adverse reactions reported. Compliance: No problems identified. Effectiveness: Clinically  acceptable.  Erlene Doyal SAUNDERS, [place]  05/09/2024  1:21 PM  Sign when Signing Visit Safety precautions to be maintained throughout the outpatient stay will include: orient to surroundings, keep bed in low position, maintain call bell within reach at all times, provide assistance with transfer out of bed and ambulation.     UDS:  No results found for: SUMMARY  No results found for: CBDTHCR No results found for: D8THCCBX No results found for: D9THCCBX  ROS  Constitutional: Denies any fever or chills Gastrointestinal: No reported hemesis, hematochezia, vomiting, or acute GI distress Musculoskeletal: Denies any acute onset joint swelling, redness, loss of ROM, or weakness Neurological: No reported episodes of acute onset apraxia, aphasia, dysarthria, agnosia, amnesia, paralysis, loss of coordination, or loss of consciousness  Medication Review  Insulin  Pen Needle, ReliOn Ultra Thin Lancets, acetaminophen , amLODipine , aspirin  EC, bisacodyl , carvedilol , diclofenac , docusate sodium , empagliflozin , enalapril , enoxaparin , fenofibrate , ferrous sulfate, furosemide , gabapentin , glucose blood, insulin  glargine, ondansetron , oxyCODONE , sitaGLIPtin , tiZANidine , tirzepatide , and traMADol   History Review  Allergy: Ms. Langwell is allergic to prednisone and metformin  and related. Drug: Ms. Zachar  reports no history of drug use. Alcohol:  reports no history of alcohol use. Tobacco:  reports that she has never smoked. She has never used smokeless tobacco. Social: Ms. Uliano  reports that she has never smoked. She has never used smokeless tobacco. She reports that she does not drink alcohol and does not use drugs. Medical:  has a past medical history of Abdominal hernia s/p mesh repair with recurrence and second repair, Allergy, Anemia, Aortic atherosclerosis (HCC), Arthritis, DDD (degenerative disc disease), lumbar, Enlarged heart, Hepatic steatosis, HFrEF (heart failure with reduced ejection fraction) (HCC), HLD  (hyperlipidemia), Hypertension, LBBB (left bundle branch block), Peripartum cardiomyopathy, and T2DM (type 2 diabetes mellitus) (HCC). Surgical: Ms. Kennard  has a past surgical history that includes Wrist fracture surgery (Right, 2013); Cesarean section (N/A, 1999); Reverse shoulder arthroplasty (Right, 08/27/2020); Total hip arthroplasty (Right, 11/29/2021); Abdominal hernia repair (2001); RECURRENT ABDOMINAL HERNIA REPAIR (2006); Cesarean section (N/A, 2001); and Total knee arthroplasty (Left, 03/14/2024). Family: family history includes Cancer in her father; Diabetes in her mother; Mental illness in her son.  Laboratory Chemistry Profile   Renal Lab Results  Component Value Date   BUN 18 03/15/2024   CREATININE 0.71 03/15/2024   LABCREA 85 11/27/2023   BCR SEE NOTE: 11/27/2023   GFRAA 102 03/22/2019   GFRNONAA >60 03/15/2024    Hepatic Lab Results  Component Value Date   AST 15 03/01/2024   ALT 14 03/01/2024   ALBUMIN 3.6 03/01/2024   ALKPHOS 58 03/01/2024   LIPASE 19 12/18/2013    Electrolytes Lab Results  Component Value Date   NA 136 03/15/2024   K 3.8 03/15/2024   CL 108 03/15/2024   CALCIUM  8.4 (L) 03/15/2024    Bone No results found for: VD25OH, CI874NY7UNU, CI6874NY7, CI7874NY7, 25OHVITD1, 25OHVITD2, 25OHVITD3, TESTOFREE, TESTOSTERONE  Inflammation (CRP: Acute Phase) (ESR: Chronic Phase) Lab Results  Component Value Date   LATICACIDVEN 1.72 01/02/2014         Note: Above Lab results reviewed.  Recent Imaging Review  DG PAIN CLINIC C-ARM 1-60 MIN NO REPORT Fluoro was used, but no Radiologist interpretation will be provided.  Please refer to NOTES tab  for provider progress note. Note: Reviewed        Physical Exam  Vitals: BP 126/64 (BP Location: Right Arm, Patient Position: Sitting)   Pulse 73   Temp (!) 97.3 F (36.3 C) (Temporal)   Resp 18   Ht 5' 2 (1.575 m)   Wt 214 lb (97.1 kg)   BMI 39.14 kg/m  BMI: Estimated body mass index  is 39.14 kg/m as calculated from the following:   Height as of this encounter: 5' 2 (1.575 m).   Weight as of this encounter: 214 lb (97.1 kg). Ideal: Ideal body weight: 50.1 kg (110 lb 7.2 oz) Adjusted ideal body weight: 68.9 kg (151 lb 13.9 oz) General appearance: Well nourished, well developed, and well hydrated. In no apparent acute distress Mental status: Alert, oriented x 3 (person, place, & time)       Respiratory: No evidence of acute respiratory distress Eyes: PERLA   Assessment   Diagnosis Status  1. Lumbar radiculopathy   2. Right leg pain   3. Bilateral primary osteoarthritis of knee   4. Chronic pain syndrome   5. Lumbar spondylosis   6. Facet syndrome, lumbar    Controlled Controlled Controlled   Updated Problems: No problems updated.  Plan of Care  Problem-specific:  Assessment and Plan  We are awaiting for MRI results that order by [facility].    Ms. ADRIELLA ESSEX has a current medication list which includes the following long-term medication(s): amlodipine , carvedilol , enalapril , enoxaparin , fenofibrate , ferrous sulfate, furosemide , gabapentin , lantus  solostar, and januvia .  Pharmacotherapy (Medications Ordered): No orders of the defined types were placed in this encounter.  Orders:  No orders of the defined types were placed in this encounter.       Return for (VV), Emmy Blanch NP, review of ordered tests.    Recent Visits Date Type Provider Dept  04/20/24 Office Visit Mansur Patti K, NP [facility]  04/07/24 Procedure visit Marcelino Nurse, MD [facility]  04/04/24 Office Visit Marcelino Nurse, MD [facility]  Showing recent visits within past 90 days and meeting all other requirements Today's Visits Date Type Provider Dept  05/09/24 Office Visit Johnie Makki K, NP [facility]  Showing today's visits and meeting all other requirements Future Appointments Date Type Provider Dept  05/23/24 Appointment  Lataya Varnell K, NP [facility]  Showing future appointments within next 90 days and meeting all other requirements  I discussed the assessment and treatment plan with the patient. The patient was provided an opportunity to ask questions and all were answered. The patient agreed with the plan and demonstrated an understanding of the instructions.  Patient advised to call back or seek an in-person evaluation if the symptoms or condition worsens.  Duration of encounter: 20 minutes.  Total time on encounter, as per AMA guidelines included both the face-to-face and non-face-to-face time personally spent by the physician and/or other qualified health care professional(s) on the day of the encounter (includes time in activities that require the physician or other qualified health care professional and does not include time in activities normally performed by clinical staff). Physician's time may include the following activities when performed: Preparing to see the patient (e.g., pre-charting review of records, searching for previously ordered imaging, lab work, and nerve conduction tests) Review of prior analgesic pharmacotherapies. Reviewing PMP Interpreting ordered tests (e.g., lab work, imaging, nerve conduction tests) Performing post-procedure evaluations, including interpretation of diagnostic procedures Obtaining and/or reviewing separately obtained history  Performing a medically appropriate examination and/or evaluation Counseling and educating the patient/family/caregiver Ordering medications, tests, or procedures Referring and communicating with other health care professionals (when not separately reported) Documenting clinical information in the electronic or other health record Independently interpreting results (not separately reported) and communicating results to the patient/ family/caregiver Care coordination (not separately reported)  Note by: Yomaira Solar K Anselmo Reihl, NP (TTS and AI  technology used. I apologize for any typographical errors that were not detected and corrected.) Date: 05/09/2024; Time: 1:43 PM

## 2024-05-10 ENCOUNTER — Ambulatory Visit: Admitting: Nurse Practitioner

## 2024-05-10 ENCOUNTER — Ambulatory Visit
Admission: RE | Admit: 2024-05-10 | Discharge: 2024-05-10 | Disposition: A | Discharge status: Home / Self Care | Source: Ambulatory Visit | Attending: Orthopedic Surgery

## 2024-05-10 DIAGNOSIS — M79604 Pain in right leg: Secondary | ICD-10-CM | POA: Insufficient documentation

## 2024-05-10 DIAGNOSIS — G894 Chronic pain syndrome: Secondary | ICD-10-CM | POA: Insufficient documentation

## 2024-05-10 DIAGNOSIS — M170 Bilateral primary osteoarthritis of knee: Secondary | ICD-10-CM | POA: Insufficient documentation

## 2024-05-10 DIAGNOSIS — M5416 Radiculopathy, lumbar region: Secondary | ICD-10-CM | POA: Diagnosis not present

## 2024-05-10 DIAGNOSIS — M47816 Spondylosis without myelopathy or radiculopathy, lumbar region: Secondary | ICD-10-CM | POA: Insufficient documentation

## 2024-05-23 ENCOUNTER — Ambulatory Visit: Attending: Nurse Practitioner | Admitting: Nurse Practitioner

## 2024-05-23 DIAGNOSIS — G894 Chronic pain syndrome: Secondary | ICD-10-CM

## 2024-05-23 DIAGNOSIS — Z91199 Patient's noncompliance with other medical treatment and regimen due to unspecified reason: Secondary | ICD-10-CM

## 2024-05-23 NOTE — Progress Notes (Signed)
 05/23/2024-Patient does not pick up her call and asking our mail box no. Please Schedule F2F with Emmy Blanch NP

## 2024-05-24 ENCOUNTER — Ambulatory Visit: Attending: Nurse Practitioner | Admitting: Nurse Practitioner

## 2024-05-24 ENCOUNTER — Telehealth: Payer: Self-pay

## 2024-05-24 ENCOUNTER — Encounter: Payer: Self-pay | Admitting: Nurse Practitioner

## 2024-05-24 VITALS — BP 147/60 | HR 69 | Temp 98.2°F | Ht 62.0 in | Wt 214.0 lb

## 2024-05-24 DIAGNOSIS — M5416 Radiculopathy, lumbar region: Secondary | ICD-10-CM | POA: Insufficient documentation

## 2024-05-24 DIAGNOSIS — M47816 Spondylosis without myelopathy or radiculopathy, lumbar region: Secondary | ICD-10-CM | POA: Insufficient documentation

## 2024-05-24 DIAGNOSIS — G894 Chronic pain syndrome: Secondary | ICD-10-CM | POA: Insufficient documentation

## 2024-05-24 NOTE — Patient Instructions (Signed)

## 2024-05-24 NOTE — Telephone Encounter (Signed)
 She was scheduled for a televisit with Seema yesterday but she said she never got a call. In the orders yesterday it said to come back in 2 weeks to go over test results. Patient doesn't want to wait 2 weeks to find out what her results are and she wants a nurse to call her and let her know what they are.

## 2024-05-24 NOTE — Progress Notes (Signed)
 PROVIDER NOTE: Interpretation of information contained herein should be left to medically-trained personnel. Specific patient instructions are provided elsewhere under Patient Instructions section of medical record. This document was created in part using AI and STT-dictation technology, any transcriptional errors that may result from this process are unintentional.  Patient: Stacy Martinez  Service: E/M   PCP: Antonette Angeline ORN, NP  DOB: 26-Apr-1956  DOS: 05/24/2024  Provider: Emmy MARLA Blanch, NP  MRN: [id number]  Delivery: Face-to-face  Specialty: Interventional Pain Management  Type: Established Patient  Setting: Ambulatory outpatient facility  Specialty designation: 09  Referring Prov.: Antonette Angeline ORN, NP  Location: Outpatient office facility       History of present illness (HPI) Stacy Martinez, a 68 y.o. year old female, is here today because of her Lumbar spondylosis [M47.816]. Stacy Martinez's primary complain today is Back Pain (Lower right back)  Pertinent problems: Stacy Martinez has has Spondylosis without myelopathy or radiculopathy, lumbar region, and Chronic pain syndrome on their pertinent problem list.  Pain Assessment: Severity of Chronic pain is reported as a 2 /10. Location: Back Right, Lower/radiates down right leg to right knee. Onset: More than a month ago. Quality: [place], Shooting. Timing: Intermittent. Modifying factor(s): sitting down. Vitals:  height is 5' 2 (1.575 m) and weight is 214 lb (97.1 kg). Her temperature is 98.2 F (36.8 C). Her blood pressure is 147/60 (abnormal) and her pulse is 69. Her oxygen saturation is 99%.  BMI: Estimated body mass index is 39.14 kg/m as calculated from the following:   Height as of this encounter: 5' 2 (1.575 m).   Weight as of this encounter: 214 lb (97.1 kg).  Last encounter: 05/23/2024. Last procedure: Visit date not found.  Reason for encounter: follow-up evaluation and possible interventional therapy.  The patient is here today to review her  lumbar MRI results and discuss possible interventional therapy.  She continues to experience low back pain; however lumbar radiofrequency ablation (RFA) has significantly helped reduce her pain level and improve functional mobility.  She expressed interest in repeating the lumbar radiofrequency ablation procedure again.  MRI shows Grade 1 anterior spondylolisthesis of L4 on 5 and L5 on S1, Moderate facet arthrosis is seen in the mid to lower lumbar spine and Moderate right foraminal narrowing at L3-4. Correlation for right L3 radiculopathy. Pharmacotherapy Assessment   Monitoring: [place] PMP: PDMP not reviewed this encounter.       Pharmacotherapy: No side-effects or adverse reactions reported. Compliance: No problems identified. Effectiveness: Clinically acceptable.  Margrette Nathanel PARAS, RN  05/24/2024  1:47 PM  Sign when Signing Visit Safety precautions to be maintained throughout the outpatient stay will include: orient to surroundings, keep bed in low position, maintain call bell within reach at all times, provide assistance with transfer out of bed and ambulation.     UDS:  No results found for: SUMMARY  No results found for: CBDTHCR No results found for: D8THCCBX No results found for: D9THCCBX  ROS  Constitutional: Denies any fever or chills Gastrointestinal: No reported hemesis, hematochezia, vomiting, or acute GI distress Musculoskeletal: Denies any acute onset joint swelling, redness, loss of ROM, or weakness Neurological: No reported episodes of acute onset apraxia, aphasia, dysarthria, agnosia, amnesia, paralysis, loss of coordination, or loss of consciousness  Medication Review  Insulin  Pen Needle, ReliOn Ultra Thin Lancets, acetaminophen , amLODipine , aspirin  EC, bisacodyl , carvedilol , diclofenac , docusate sodium , empagliflozin , enalapril , enoxaparin , fenofibrate , ferrous sulfate, furosemide , gabapentin , glucose blood, insulin  glargine, ondansetron , oxyCODONE , sitaGLIPtin ,  tiZANidine , tirzepatide ,  and traMADol   History Review  Allergy: Stacy Martinez is allergic to metformin  and metformin  and related. Drug: Stacy Martinez  reports no history of drug use. Alcohol:  reports no history of alcohol use. Tobacco:  reports that she has never smoked. She has never used smokeless tobacco. Social: Stacy Martinez  reports that she has never smoked. She has never used smokeless tobacco. She reports that she does not drink alcohol and does not use drugs. Medical:  has a past medical history of Abdominal hernia s/p mesh repair with recurrence and second repair, Allergy, Anemia, Aortic atherosclerosis, Arthritis, DDD (degenerative disc disease), lumbar, Enlarged heart, Hepatic steatosis, HFrEF (heart failure with reduced ejection fraction) (HCC), HLD (hyperlipidemia), Hypertension, LBBB (left bundle branch block), Peripartum cardiomyopathy, and T2DM (type 2 diabetes mellitus) (HCC). Surgical: Stacy Martinez  has a past surgical history that includes Wrist fracture surgery (Right, 2013); Cesarean section (N/A, 1999); Reverse shoulder arthroplasty (Right, 08/27/2020); Total hip arthroplasty (Right, 11/29/2021); Abdominal hernia repair (2001); RECURRENT ABDOMINAL HERNIA REPAIR (2006); Cesarean section (N/A, 2001); and Total knee arthroplasty (Left, 03/14/2024). Family: family history includes Cancer in her father; Diabetes in her mother; Mental illness in her son.  Laboratory Chemistry Profile   Renal Lab Results  Component Value Date   BUN 18 03/15/2024   CREATININE 0.71 03/15/2024   LABCREA 85 11/27/2023   BCR SEE NOTE: 11/27/2023   GFRAA 102 03/22/2019   GFRNONAA >60 03/15/2024    Hepatic Lab Results  Component Value Date   AST 15 03/01/2024   ALT 14 03/01/2024   ALBUMIN 3.6 03/01/2024   ALKPHOS 58 03/01/2024   LIPASE 19 12/18/2013    Electrolytes Lab Results  Component Value Date   NA 136 03/15/2024   K 3.8 03/15/2024   CL 108 03/15/2024   CALCIUM  8.4 (L) 03/15/2024    Bone No  results found for: VD25OH, [id number], CI6874NY7, CI7874NY7, 25OHVITD1, 25OHVITD2, 25OHVITD3, TESTOFREE, TESTOSTERONE  Inflammation (CRP: Acute Phase) (ESR: Chronic Phase) Lab Results  Component Value Date   LATICACIDVEN 1.72 01/02/2014         Note: Above Lab results reviewed.  Recent Imaging Review  MR LUMBAR SPINE WO CONTRAST MR LUMBAR SPINE WITHOUT IV CONTRAST  COMPARISON: None available  CLINICAL HISTORY: Back pain.  TECHNIQUE: SAG T2, SAG T1, SAG STIR, AX T2, AX T1 without IV contrast.  FINDINGS: There is grade 1 anterior spondylolisthesis of L4 on 5 and L5 on S1 secondary to facet arthrosis at L4-5 and pars defects at L5-S1 on the left. Mild multilevel facet arthrosis is present. Mild scoliotic curvature is present. There is no vertebral body height loss, subluxation or marrow replacing process. The sacrum and SI joints are unremarkable so far as visualized. Conus and cauda equina are unremarkable.  T12-L1: There is no focal disc protrusion, foraminal or spinal stenosis.  L1-2: There is no focal disc protrusion, foraminal or spinal stenosis.  L2-3: There is no focal disc protrusion, foraminal or spinal stenosis. Mild facet arthrosis.  L3-4: Broad-based disc osteophyte and moderate facet arthrosis and ligamentum flavum hypertrophy. There is no significant spinal stenosis. Moderate right foraminal narrowing. Correlation for mild right L3 radiculopathy.  L4-5: Mild grade 1 anterior spondylolisthesis secondary to facet arthrosis. There is moderate severe facet arthrosis. There is moderate to severe bilateral foraminal narrowing. Correlation for L4 radiculopathy. There is effacement of ventral thecal sac slightly crowding the descending nerve roots in lateral recess without impingement.  L5-S1: There is grade 1 anterior spondylolisthesis secondary to severe facet arthrosis and  marginal defect on the left. There is significant foraminal narrowing,  left greater than right. Correlation for left L5 radicular symptoms. No significant spinal stenosis is present.  The retroperitoneal structures demonstrate no significant abnormality.  IMPRESSION: Grade 1 anterior spondylolisthesis of L4 on 5 and L5 on S1 as above. There is moderate foraminal narrowing, right greater than left at L4-5 and left greater than right at L5-S1. Correlation for radicular symptoms. No significant spinal stenosis is appreciated. No acute abnormality.  Moderate right foraminal narrowing at L3-4. Correlation for right L3 radiculopathy.  Moderate facet arthrosis is seen in the mid to lower lumbar spine.  Electronically signed by: Norleen Satchel MD 05/12/2024 03:59 PM EDT RP Workstation: [id number] Note: Reviewed        Physical Exam  Vitals: BP (!) 147/60   Pulse 69   Temp 98.2 F (36.8 C)   Ht 5' 2 (1.575 m)   Wt 214 lb (97.1 kg)   SpO2 99%   BMI 39.14 kg/m  BMI: Estimated body mass index is 39.14 kg/m as calculated from the following:   Height as of this encounter: 5' 2 (1.575 m).   Weight as of this encounter: 214 lb (97.1 kg). Ideal: Ideal body weight: 50.1 kg (110 lb 7.2 oz) Adjusted ideal body weight: 68.9 kg (151 lb 13.9 oz) General appearance: Well nourished, well developed, and well hydrated. In no apparent acute distress Mental status: Alert, oriented x 3 (person, place, & time)       Respiratory: No evidence of acute respiratory distress Eyes: PERLA   Assessment   Diagnosis Status  1. Lumbar spondylosis   2. Lumbar radiculopathy   3. Spondylosis without myelopathy or radiculopathy, lumbar region   4. Facet syndrome, lumbar   5. Chronic pain syndrome    Having a Flare-up Having a Flare-up Having a Flare-up   Updated Problems: No problems updated.  Plan of Care  Problem-specific:  Assessment and Plan  No other problems or issues reported at this visit.  Plan: (ECT): (B) Lumbar RFA with IV versed  with Dr. Marcelino   Ms.  Stacy Martinez has a current medication list which includes the following long-term medication(s): amlodipine , carvedilol , enalapril , enoxaparin , fenofibrate , ferrous sulfate, furosemide , gabapentin , lantus  solostar, and januvia .  Pharmacotherapy (Medications Ordered): No orders of the defined types were placed in this encounter.  Orders:  Orders Placed This Encounter  Procedures   Radiofrequency,Lumbar    Standing Status:   Future    Expiration Date:   08/23/2024    Scheduling Instructions:     Side(s): Bilateral      Level: L3-4, L4-5, and L5-S1 Facets (L3, L4, L5, and S1 Medial Branch)     Sedation: With Sedation.     Timeframe: As soon as schedule allows.    Where will this procedure be performed?:   [facility]      Return in about 3 weeks (around 06/14/2024) for (ECT): (B) Lumbar RFA with IV versed  with Dr. Marcelino .    Recent Visits Date Type Provider Dept  05/23/24 Office Visit Jerry Haugen K, NP [facility]  05/09/24 Office Visit Genella Bas K, NP [facility]  04/20/24 Office Visit Azrael Huss K, NP [facility]  04/07/24 Procedure visit Marcelino Nurse, MD [facility]  04/04/24 Office Visit Marcelino Nurse, MD [facility]  Showing recent visits within past 90 days and meeting all other requirements Today's Visits Date Type Provider Dept  05/24/24 Office Visit Elaiza Shoberg  K, NP [facility]  Showing today's visits and meeting all other requirements Future Appointments No visits were found meeting these conditions. Showing future appointments within next 90 days and meeting all other requirements  I discussed the assessment and treatment plan with the patient. The patient was provided an opportunity to ask questions and all were answered. The patient agreed with the plan and demonstrated an understanding of the instructions.  Patient advised to call back or seek an in-person evaluation if the symptoms or  condition worsens.  Duration of encounter: 20 minutes.  Total time on encounter, as per AMA guidelines included both the face-to-face and non-face-to-face time personally spent by the physician and/or other qualified health care professional(s) on the day of the encounter (includes time in activities that require the physician or other qualified health care professional and does not include time in activities normally performed by clinical staff). Physician's time may include the following activities when performed: Preparing to see the patient (e.g., pre-charting review of records, searching for previously ordered imaging, lab work, and nerve conduction tests) Review of prior analgesic pharmacotherapies. Reviewing PMP Interpreting ordered tests (e.g., lab work, imaging, nerve conduction tests) Performing post-procedure evaluations, including interpretation of diagnostic procedures Obtaining and/or reviewing separately obtained history Performing a medically appropriate examination and/or evaluation Counseling and educating the patient/family/caregiver Ordering medications, tests, or procedures Referring and communicating with other health care professionals (when not separately reported) Documenting clinical information in the electronic or other health record Independently interpreting results (not separately reported) and communicating results to the patient/ family/caregiver Care coordination (not separately reported)  Note by: Emmy MARLA Blanch, NP  Date: 05/24/2024; Time: 3:05 PM

## 2024-05-24 NOTE — Telephone Encounter (Signed)
 Reschedule to today.

## 2024-05-24 NOTE — Progress Notes (Signed)
 Safety precautions to be maintained throughout the outpatient stay will include: orient to surroundings, keep bed in low position, maintain call bell within reach at all times, provide assistance with transfer out of bed and ambulation.

## 2024-05-27 ENCOUNTER — Other Ambulatory Visit: Payer: Self-pay | Admitting: Student in an Organized Health Care Education/Training Program

## 2024-05-27 DIAGNOSIS — M5416 Radiculopathy, lumbar region: Secondary | ICD-10-CM

## 2024-05-27 DIAGNOSIS — M79604 Pain in right leg: Secondary | ICD-10-CM

## 2024-05-30 ENCOUNTER — Ambulatory Visit: Admitting: Internal Medicine

## 2024-05-31 ENCOUNTER — Other Ambulatory Visit: Payer: Self-pay | Admitting: Internal Medicine

## 2024-06-01 ENCOUNTER — Encounter: Admitting: Internal Medicine

## 2024-06-01 NOTE — Progress Notes (Deleted)
 Subjective:    Patient ID: Stacy Martinez, female    DOB: 04/13/1956, 68 y.o.   MRN: [id number]  HPI  Patient presents to clinic today for her annual exam.  Flu: 05/2023 Tetanus: 10/2019 COVID: Pneumovax: 06/2019 Prevnar: 06/2021 Shingrix: Never Pap smear: Mammogram: Bone density: Colon screening: Vision screening: Dentist:  Diet: Exercise:   Review of Systems   Past Medical History:  Diagnosis Date   Abdominal hernia s/p mesh repair with recurrence and second repair    Allergy    Anemia    Aortic atherosclerosis    Arthritis    DDD (degenerative disc disease), lumbar    Enlarged heart    Hepatic steatosis    HFrEF (heart failure with reduced ejection fraction) (HCC)    HLD (hyperlipidemia)    Hypertension    LBBB (left bundle branch block)    Peripartum cardiomyopathy    T2DM (type 2 diabetes mellitus) (HCC)     Current Outpatient Medications  Medication Sig Dispense Refill   acetaminophen  (TYLENOL ) 500 MG tablet Take 2 tablets (1,000 mg total) by mouth every 8 (eight) hours. 30 tablet 0   amLODipine  (NORVASC ) 10 MG tablet Take 1 tablet (10 mg total) by mouth daily. 90 tablet 1   aspirin  EC 81 MG tablet Take 81 mg by mouth daily. Swallow whole. (Patient taking differently: Take 81 mg by mouth in the morning and at bedtime. Swallow whole.)     bisacodyl  (DULCOLAX) 5 MG EC tablet Take 5 mg by mouth daily as needed for moderate constipation.     carvedilol  (COREG ) 25 MG tablet TAKE 1 TABLET BY MOUTH TWICE DAILY WITH MEALS . APPOINTMENT REQUIRED FOR FUTURE REFILLS 180 tablet 0   diclofenac  (VOLTAREN ) 75 MG EC tablet Take 1 tablet (75 mg total) by mouth every 12 (twelve) hours as needed for moderate pain (pain score 4-6). 60 tablet 1   docusate sodium  (COLACE) 100 MG capsule Take 1 capsule (100 mg total) by mouth 2 (two) times daily. 10 capsule 0   enalapril  (VASOTEC ) 20 MG tablet Take 1 tablet (20 mg total) by mouth 2 (two) times daily. 180 tablet 1   enoxaparin   (LOVENOX ) 40 MG/0.4ML injection Inject 0.4 mLs (40 mg total) into the skin daily for 14 days. 5.6 mL 0   fenofibrate  54 MG tablet Take 1 tablet (54 mg total) by mouth daily. 90 tablet 1   ferrous sulfate 325 (65 FE) MG tablet Take 325 mg by mouth daily with breakfast.     furosemide  (LASIX ) 40 MG tablet Take 1 tablet by mouth once daily 90 tablet 0   gabapentin  (NEURONTIN ) 300 MG capsule Take 1 capsule (300 mg total) by mouth 3 (three) times daily. 180 capsule 1   glucose blood (RELION GLUCOSE TEST STRIPS) test strip Use as instructed 100 each 12   insulin  glargine (LANTUS  SOLOSTAR) 100 UNIT/ML Solostar Pen Inject 34 Units into the skin daily. 15 mL 0   JANUVIA  100 MG tablet Take 1 tablet by mouth once daily 90 tablet 0   JARDIANCE  25 MG TABS tablet Take 1 tablet by mouth once daily 90 tablet 0   ondansetron  (ZOFRAN ) 4 MG tablet Take 1 tablet (4 mg total) by mouth every 6 (six) hours as needed for nausea. 30 tablet 0   ondansetron  (ZOFRAN ) 4 MG tablet Take 1 tablet (4 mg total) by mouth every 6 (six) hours as needed for nausea. 20 tablet 0   oxyCODONE  (ROXICODONE ) 5 MG immediate release tablet Take  0.5-1 tablets (2.5-5 mg total) by mouth every 8 (eight) hours as needed for breakthrough pain. 20 tablet 0   ReliOn Ultra Thin Lancets MISC 1 each by Does not apply route daily. 100 each 11   tirzepatide  (MOUNJARO ) 5 MG/0.5ML Pen INJECT ONE SYRINGEFUL (5MG ) INTO THE SKIN ONCE WEEKLY 12 mL 0   tiZANidine  (ZANAFLEX ) 4 MG tablet Take 1 tablet (4 mg total) by mouth every 8 (eight) hours as needed for muscle spasms. 90 tablet 1   traMADol  (ULTRAM ) 50 MG tablet Take 1 tablet (50 mg total) by mouth every 6 (six) hours as needed for moderate pain (pain score 4-6). 30 tablet 0   ULTICARE MINI PEN NEEDLES 32G X 6 MM MISC 1 Bottle by Does not apply route as directed. 100 each 1   No current facility-administered medications for this visit.    Allergies  Allergen Reactions   Metformin  Nausea Only   Metformin   And Related Diarrhea and Nausea Only    Severe nausea and diarrhea even with extended release versions    Family History  Problem Relation Age of Onset   Diabetes Mother    Cancer Father    Mental illness Son     Social History   Socioeconomic History   Marital status: Divorced    Spouse name: Not on file   Number of children: 4   Years of education: Not on file   Highest education level: Not on file  Occupational History   Not on file  Tobacco Use   Smoking status: Never   Smokeless tobacco: Never  Vaping Use   Vaping status: Never Used  Substance and Sexual Activity   Alcohol use: No    Alcohol/week: 0.0 standard drinks of alcohol   Drug use: No   Sexual activity: Not on file  Other Topics Concern   Not on file  Social History Narrative   Not on file   Social Drivers of Health   Financial Resource Strain: Low Risk  (03/02/2024)   Received from [facility]   Overall Financial Resource Strain (CARDIA)    Difficulty of Paying Living Expenses: Not hard at all  Food Insecurity: No Food Insecurity (03/02/2024)   Received from [facility]   Hunger Vital Sign    Within the past 12 months, you worried that your food would run out before you got the money to buy more.: Never true    Within the past 12 months, the food you bought just didn't last and you didn't have money to get more.: Never true  Transportation Needs: No Transportation Needs (03/02/2024)   Received from [facility] - Transportation    In the past 12 months, has lack of transportation kept you from medical appointments or from getting medications?: No    Lack of Transportation (Non-Medical): No  Physical Activity: Not on file  Stress: Not on file  Social Connections: Not on file  Intimate Partner Violence: Not on file     Constitutional: Denies fever, malaise, fatigue, headache or abrupt weight changes.  HEENT: Denies eye pain, eye redness,  ear pain, ringing in the ears, wax buildup, runny nose, nasal congestion, bloody nose, or sore throat. Respiratory: Denies difficulty breathing, shortness of breath, cough or sputum production.   Cardiovascular: Denies chest pain, chest tightness, palpitations or swelling in the hands or feet.  Gastrointestinal: Denies abdominal pain, bloating, constipation, diarrhea or blood in the stool.  GU: Denies urgency, frequency,  pain with urination, burning sensation, blood in urine, odor or discharge. Musculoskeletal: Patient reports chronic joint pain.  Denies decrease in range of motion, difficulty with gait, muscle pain or joint swelling.  Skin: Denies redness, rashes, lesions or ulcercations.  Neurological: Denies dizziness, difficulty with memory, difficulty with speech or problems with balance and coordination.  Psych: Denies anxiety, depression, SI/HI.  No other specific complaints in a complete review of systems (except as listed in HPI above).      Objective:   Physical Exam  There were no vitals taken for this visit. Wt Readings from Last 3 Encounters:  05/24/24 214 lb (97.1 kg)  05/09/24 214 lb (97.1 kg)  04/20/24 214 lb (97.1 kg)    General: Appears their stated age, well developed, well nourished in NAD. Skin: Warm, dry and intact. No rashes, lesions or ulcerations noted. HEENT: Head: normal shape and size; Eyes: sclera white, no icterus, conjunctiva pink, PERRLA and EOMs intact; Ears: Tm's gray and intact, normal light reflex; Nose: mucosa pink and moist, septum midline; Throat/Mouth: Teeth present, mucosa pink and moist, no exudate, lesions or ulcerations noted.  Neck:  Neck supple, trachea midline. No masses, lumps or thyromegaly present.  Cardiovascular: Normal rate and rhythm. S1,S2 noted.  No murmur, rubs or gallops noted. No JVD or BLE edema. No carotid bruits noted. Pulmonary/Chest: Normal effort and positive vesicular breath sounds. No respiratory distress. No wheezes,  rales or ronchi noted.  Abdomen: Soft and nontender. Normal bowel sounds. No distention or masses noted. Liver, spleen and kidneys non palpable. Musculoskeletal: Normal range of motion. No signs of joint swelling. No difficulty with gait.  Neurological: Alert and oriented. Cranial nerves II-XII grossly intact. Coordination normal.  Psychiatric: Mood and affect normal. Behavior is normal. Judgment and thought content normal.    BMET    Component Value Date/Time   NA 136 03/15/2024 0517   NA 136 11/30/2015 1037   K 3.8 03/15/2024 0517   CL 108 03/15/2024 0517   CO2 20 (L) 03/15/2024 0517   GLUCOSE 128 (H) 03/15/2024 0517   BUN 18 03/15/2024 0517   BUN 19 11/30/2015 1037   CREATININE 0.71 03/15/2024 0517   CREATININE 0.67 11/27/2023 1457   CALCIUM  8.4 (L) 03/15/2024 0517   GFRNONAA >60 03/15/2024 0517   GFRNONAA 88 03/22/2019 0909   GFRAA 102 03/22/2019 0909    Lipid Panel     Component Value Date/Time   CHOL 207 (H) 11/27/2023 1457   CHOL 182 11/30/2015 1037   TRIG 158 (H) 11/27/2023 1457   HDL 55 11/27/2023 1457   HDL 47 11/30/2015 1037   CHOLHDL 3.8 11/27/2023 1457   VLDL 33 (H) 04/08/2017 0857   LDLCALC 125 (H) 11/27/2023 1457    CBC    Component Value Date/Time   WBC 13.5 (H) 03/15/2024 0517   RBC 4.11 03/15/2024 0517   HGB 11.1 (L) 03/15/2024 0517   HGB 12.2 11/30/2015 1037   HCT 34.6 (L) 03/15/2024 0517   HCT 38.0 11/30/2015 1037   PLT 289 03/15/2024 0517   PLT 428 (H) 11/30/2015 1037   MCV 84.2 03/15/2024 0517   MCV 83 11/30/2015 1037   MCH 27.0 03/15/2024 0517   MCHC 32.1 03/15/2024 0517   RDW 15.4 03/15/2024 0517   RDW 15.1 11/30/2015 1037   LYMPHSABS 3.5 03/01/2024 1038   LYMPHSABS 3.1 11/30/2015 1037   MONOABS 0.9 03/01/2024 1038   EOSABS 0.9 (H) 03/01/2024 1038   EOSABS 0.4 11/30/2015 1037   BASOSABS 0.1  03/01/2024 1038   BASOSABS 0.1 11/30/2015 1037    Hgb A1C Lab Results  Component Value Date   HGBA1C 6.4 (H) 11/27/2023             Assessment & Plan:   Preventative health maintenance:  Flu Tetanus UTD Encouraged her to get her COVID-vaccine Pneumovax and Prevnar UTD Discussed Shingrix vaccine, she will check coverage with her insurance company and schedule visit if she would like to have this done She no longer needs to screen for cervical cancer Mammogram Bone density Colon Screening Encouraged her to consume a balanced diet and exercise regimen Advised her to see an eye doctor and dentist annually We will check CBC, c-Met, lipid, A1c and urine microalbumin today  RTC in 6 months, follow-up chronic conditions Angeline Laura, NP

## 2024-06-01 NOTE — Telephone Encounter (Signed)
 Requested medication (s) are due for refill today: yes  Requested medication (s) are on the active medication list: yes  Last refill:  04/22/24  Future visit scheduled: no  Notes to clinic:  Unable to refill per protocol due to failed labs, no updated results.      Requested Prescriptions  Pending Prescriptions Disp Refills   LANTUS  SOLOSTAR 100 UNIT/ML Solostar Pen [Pharmacy Med Name: Lantus  SoloStar 100 UNIT/ML Subcutaneous Solution Pen-injector] 15 mL 0    Sig: INJECT 34 UNITS SUBCUTANEOUSLY ONCE DAILY     Endocrinology:  Diabetes - Insulins Failed - 06/01/2024  4:10 PM      Failed - HBA1C is between 0 and 7.9 and within 180 days    Hemoglobin A1C  Date Value Ref Range Status  01/06/2017 8.0  Final   Hgb A1c MFr Bld  Date Value Ref Range Status  11/27/2023 6.4 (H) <5.7 % of total Hgb Final    Comment:    For someone without known diabetes, a hemoglobin  A1c value between 5.7% and 6.4% is consistent with prediabetes and should be confirmed with a  follow-up test. . For someone with known diabetes, a value <7% indicates that their diabetes is well controlled. A1c targets should be individualized based on duration of diabetes, age, comorbid conditions, and other considerations. . This assay result is consistent with an increased risk of diabetes. . Currently, no consensus exists regarding use of hemoglobin A1c for diagnosis of diabetes for children. SABRA Amy - Valid encounter within last 6 months    Recent Outpatient Visits           3 months ago Bilateral primary osteoarthritis of knee   [place] [facility] [place], Angeline ORN, NP   6 months ago Urinary urgency   [place] [facility] [place], [place] W, NP   6 months ago DM type 2 with diabetic peripheral neuropathy [facility])   [place] [facility] [place], Angeline ORN, [place]

## 2024-06-03 ENCOUNTER — Other Ambulatory Visit: Payer: Self-pay | Admitting: *Deleted

## 2024-06-03 DIAGNOSIS — M79604 Pain in right leg: Secondary | ICD-10-CM

## 2024-06-03 DIAGNOSIS — M5416 Radiculopathy, lumbar region: Secondary | ICD-10-CM

## 2024-06-06 ENCOUNTER — Ambulatory Visit: Admitting: Nurse Practitioner

## 2024-06-06 MED ORDER — DICLOFENAC SODIUM 75 MG PO TBEC: 75.0000 mg | DELAYED_RELEASE_TABLET | Freq: Two times a day (BID) | ORAL | 1 refills | Status: DC | PRN

## 2024-06-17 ENCOUNTER — Other Ambulatory Visit: Payer: Self-pay | Admitting: Internal Medicine

## 2024-06-17 DIAGNOSIS — I10 Essential (primary) hypertension: Secondary | ICD-10-CM

## 2024-06-18 ENCOUNTER — Other Ambulatory Visit: Payer: Self-pay | Admitting: Internal Medicine

## 2024-06-18 DIAGNOSIS — I10 Essential (primary) hypertension: Secondary | ICD-10-CM

## 2024-06-18 DIAGNOSIS — E1142 Type 2 diabetes mellitus with diabetic polyneuropathy: Secondary | ICD-10-CM

## 2024-06-20 ENCOUNTER — Ambulatory Visit (HOSPITAL_BASED_OUTPATIENT_CLINIC_OR_DEPARTMENT_OTHER): Admitting: Student in an Organized Health Care Education/Training Program

## 2024-06-20 ENCOUNTER — Ambulatory Visit
Admission: RE | Admit: 2024-06-20 | Discharge: 2024-06-20 | Disposition: A | Discharge status: Home / Self Care | Source: Ambulatory Visit | Attending: Student in an Organized Health Care Education/Training Program | Admitting: Student in an Organized Health Care Education/Training Program

## 2024-06-20 VITALS — BP 114/66 | HR 71 | Temp 97.8°F | Resp 14 | Ht 62.0 in | Wt 214.0 lb

## 2024-06-20 DIAGNOSIS — M47816 Spondylosis without myelopathy or radiculopathy, lumbar region: Secondary | ICD-10-CM | POA: Diagnosis not present

## 2024-06-20 MED ORDER — MIDAZOLAM HCL (PF) 2 MG/2ML IJ SOLN
0.5000 mg | Freq: Once | INTRAMUSCULAR | Status: AC
  Administered 2024-06-20: 2 mg via INTRAVENOUS
  Filled 2024-06-20: qty 2

## 2024-06-20 MED ORDER — LIDOCAINE HCL 2 % IJ SOLN
20.0000 mL | Freq: Once | INTRAMUSCULAR | Status: AC
  Administered 2024-06-20: 400 mg
  Filled 2024-06-20: qty 20

## 2024-06-20 MED ORDER — DEXAMETHASONE SOD PHOSPHATE PF 10 MG/ML IJ SOLN
20.0000 mg | Freq: Once | INTRAMUSCULAR | Status: AC
  Administered 2024-06-20: 10 mg

## 2024-06-20 MED ORDER — LACTATED RINGERS IV SOLN: Freq: Once | INTRAVENOUS | Status: AC

## 2024-06-20 MED ORDER — ROPIVACAINE HCL 2 MG/ML IJ SOLN
18.0000 mL | Freq: Once | INTRAMUSCULAR | Status: AC
  Administered 2024-06-20: 20 mL via PERINEURAL
  Filled 2024-06-20: qty 20

## 2024-06-20 NOTE — Telephone Encounter (Signed)
 Requested Prescriptions  Pending Prescriptions Disp Refills   carvedilol  (COREG ) 25 MG tablet [Pharmacy Med Name: Carvedilol  25 MG Oral Tablet] 180 tablet 0    Sig: TAKE 1 TABLET BY MOUTH TWICE DAILY WITH MEALS APPOINTMENT  REQUIRED  FOR  FUTURE  REFILLS     Cardiovascular: Beta Blockers 3 Passed - 06/20/2024  1:52 PM      Passed - Cr in normal range and within 360 days    Creat  Date Value Ref Range Status  11/27/2023 0.67 0.50 - 1.05 mg/dL Final   Creatinine, Ser  Date Value Ref Range Status  03/15/2024 0.71 0.44 - 1.00 mg/dL Final   Creatinine, Urine  Date Value Ref Range Status  11/27/2023 85 20 - 275 mg/dL Final         Passed - AST in normal range and within 360 days    AST  Date Value Ref Range Status  03/01/2024 15 15 - 41 U/L Final         Passed - ALT in normal range and within 360 days    ALT  Date Value Ref Range Status  03/01/2024 14 0 - 44 U/L Final         Passed - Last BP in normal range    BP Readings from Last 1 Encounters:  06/20/24 114/66         Passed - Last Heart Rate in normal range    Pulse Readings from Last 1 Encounters:  06/20/24 71         Passed - Valid encounter within last 6 months    Recent Outpatient Visits           3 months ago Bilateral primary osteoarthritis of knee   [place] [facility] [place], Angeline ORN, NP   6 months ago Urinary urgency   [place] [facility] [place], [place] W, NP   6 months ago DM type 2 with diabetic peripheral neuropathy [facility])   [place] [facility] [place], Angeline ORN, [place]

## 2024-06-20 NOTE — Progress Notes (Signed)
 Safety precautions to be maintained throughout the outpatient stay will include: orient to surroundings, keep bed in low position, maintain call bell within reach at all times, provide assistance with transfer out of bed and ambulation.

## 2024-06-20 NOTE — Progress Notes (Signed)
 PROVIDER NOTE: Information contained herein reflects review and annotations entered in association with encounter. Interpretation of such information and data should be left to medically-trained personnel. Information provided to patient can be located elsewhere in the medical record under Patient Instructions. Document created using STT-dictation technology, any transcriptional errors that may result from process are unintentional.    Patient: Stacy Martinez  Service Category: Procedure  Provider: Wallie Sherry, MD  DOB: 01-25-1956  DOS: 06/20/2024  Location: [facility]  MRN: [id number]  Setting: Ambulatory - outpatient  Referring Provider: Antonette Angeline ORN, NP  Type: Established Patient  Specialty: Interventional Pain Management  PCP: Antonette Angeline ORN, NP   Primary Reason for Visit: Interventional Pain Management Treatment. CC: Back Pain   Procedure:          Anesthesia, Analgesia, Anxiolysis:  Type: Thermal Lumbar Facet, Medial Branch Radiofrequency Ablation/Neurotomy           Primary Purpose: Therapeutic Region: Posterolateral Lumbosacral Spine Level: L3, L4, L5, Medial Branch Level(s). These levels will denervate the L3-4, L4-5, lumbar facet joints. Laterality: Bilateral  Type: minimal sedation IV Versed  Indication(s): Anxiety Route: Intravenous (IV) IV Access: Secured Sedation: Meaningful verbal contact was maintained at all times during the procedure  Local Anesthetic: Lidocaine  1-2%  Position: Prone   Indications: 1. Lumbar spondylosis   2. Spondylosis without myelopathy or radiculopathy, lumbar region   3. Facet syndrome, lumbar    [name] has been dealing with the above chronic pain for longer than three months and has either failed to respond, was unable to tolerate, or simply did not get enough benefit from other more conservative therapies including, but not limited to: 1. Over-the-counter medications 2. Anti-inflammatory medications 3. Muscle  relaxants 4. Membrane stabilizers 5. Opioids 6. Physical therapy and/or chiropractic manipulation 7. Modalities (Heat, ice, etc.) 8. Invasive techniques such as nerve blocks. [name] has attained more than 50% relief of the pain from a series of diagnostic injections conducted in separate occasions.  Pain Score: Pre-procedure: 8 /10 Post-procedure: 0-No pain/10  Pre-op H&P Assessment:  [name] is a 68 y.o. (year old), female patient, seen today for interventional treatment. She  has a past surgical history that includes Wrist fracture surgery (Right, 2013); Cesarean section (N/A, 1999); Reverse shoulder arthroplasty (Right, 08/27/2020); Total hip arthroplasty (Right, 11/29/2021); Abdominal hernia repair (2001); RECURRENT ABDOMINAL HERNIA REPAIR (2006); Cesarean section (N/A, 2001); and Total knee arthroplasty (Left, 03/14/2024). [name] has a current medication list which includes the following prescription(s): acetaminophen , amlodipine , aspirin  ec, bisacodyl , carvedilol , diclofenac , docusate sodium , enalapril , enoxaparin , fenofibrate , ferrous sulfate, furosemide , gabapentin , relion glucose test strips, lantus  solostar, januvia , jardiance , ondansetron , ondansetron , oxycodone , relion ultra thin lancets, mounjaro , tizanidine , tramadol , and ulticare mini pen needles, and the following Facility-Administered Medications: lactated ringers . Her primarily concern today is the Back Pain   Initial Vital Signs:  Pulse/HCG Rate: 71ECG Heart Rate: 70 Temp: 98 F (36.7 C) Resp: 16 BP: 98/76 SpO2: (!) 81 %  BMI: Estimated body mass index is 39.14 kg/m as calculated from the following:   Height as of this encounter: 5' 2 (1.575 m).   Weight as of this encounter: 214 lb (97.1 kg).  Risk Assessment: Allergies: Reviewed. She is allergic to metformin  and related.  Allergy Precautions: None required Coagulopathies: Reviewed. None identified.  Blood-thinner therapy: None at this time Active  Infection(s): Reviewed. None identified. [name] is afebrile  Site Confirmation: [name] was asked to confirm the procedure and laterality before marking the site  Procedure checklist: Completed Consent: Before the procedure and under the influence of no sedative(s), amnesic(s), or anxiolytics, the patient was informed of the treatment options, risks and possible complications. To fulfill our ethical and legal obligations, as recommended by the American Medical Association's Code of Ethics, I have informed the patient of my clinical impression; the nature and purpose of the treatment or procedure; the risks, benefits, and possible complications of the intervention; the alternatives, including doing nothing; the risk(s) and benefit(s) of the alternative treatment(s) or procedure(s); and the risk(s) and benefit(s) of doing nothing. The patient was provided information about the general risks and possible complications associated with the procedure. These may include, but are not limited to: failure to achieve desired goals, infection, bleeding, organ or nerve damage, allergic reactions, paralysis, and death. In addition, the patient was informed of those risks and complications associated to Spine-related procedures, such as failure to decrease pain; infection (i.e.: Meningitis, epidural or intraspinal abscess); bleeding (i.e.: epidural hematoma, subarachnoid hemorrhage, or any other type of intraspinal or peri-dural bleeding); organ or nerve damage (i.e.: Any type of peripheral nerve, nerve root, or spinal cord injury) with subsequent damage to sensory, motor, and/or autonomic systems, resulting in permanent pain, numbness, and/or weakness of one or several areas of the body; allergic reactions; (i.e.: anaphylactic reaction); and/or death. Furthermore, the patient was informed of those risks and complications associated with the medications. These include, but are not limited to: allergic reactions (i.e.:  anaphylactic or anaphylactoid reaction(s)); adrenal axis suppression; blood sugar elevation that in diabetics may result in ketoacidosis or comma; water retention that in patients with history of congestive heart failure may result in shortness of breath, pulmonary edema, and decompensation with resultant heart failure; weight gain; swelling or edema; medication-induced neural toxicity; particulate matter embolism and blood vessel occlusion with resultant organ, and/or nervous system infarction; and/or aseptic necrosis of one or more joints. Finally, the patient was informed that Medicine is not an exact science; therefore, there is also the possibility of unforeseen or unpredictable risks and/or possible complications that may result in a catastrophic outcome. The patient indicated having understood very clearly. We have given the patient no guarantees and we have made no promises. Enough time was given to the patient to ask questions, all of which were answered to the patient's satisfaction. Ms. Whitelaw has indicated that she wanted to continue with the procedure. Attestation: I, the ordering provider, attest that I have discussed with the patient the benefits, risks, side-effects, alternatives, likelihood of achieving goals, and potential problems during recovery for the procedure that I have provided informed consent. Date  Time: 06/20/2024  8:34 AM  Pre-Procedure Preparation:  Monitoring: As per clinic protocol. Respiration, ETCO2, SpO2, BP, heart rate and rhythm monitor placed and checked for adequate function Safety Precautions: Patient was assessed for positional comfort and pressure points before starting the procedure. Time-out: I initiated and conducted the Time-out before starting the procedure, as per protocol. The patient was asked to participate by confirming the accuracy of the Time Out information. Verification of the correct person, site, and procedure were performed and confirmed by me,  the nursing staff, and the patient. Time-out conducted as per Joint Commission's Universal Protocol (UP.01.01.01). Time: 0931  Description of Procedure:          Laterality: Bilateral Levels:  L3, L4, L5, Medial Branch Level(s), at the L3-4, L4-5, lumbar facet joints. Area Prepped: Lumbosacral DuraPrep (Iodine Povacrylex [0.7% available iodine] and Isopropyl Alcohol, 74% w/w) Safety Precautions: Aspiration looking  for blood return was conducted prior to all injections. At no point did we inject any substances, as a needle was being advanced. Before injecting, the patient was told to immediately notify me if she was experiencing any new onset of ringing in the ears, or metallic taste in the mouth. No attempts were made at seeking any paresthesias. Safe injection practices and needle disposal techniques used. Medications properly checked for expiration dates. SDV (single dose vial) medications used. After the completion of the procedure, all disposable equipment used was discarded in the proper designated medical waste containers. Local Anesthesia: Protocol guidelines were followed. The patient was positioned over the fluoroscopy table. The area was prepped in the usual manner. The time-out was completed. The target area was identified using fluoroscopy. A 12-in long, straight, sterile hemostat was used with fluoroscopic guidance to locate the targets for each level blocked. Once located, the skin was marked with an approved surgical skin marker. Once all sites were marked, the skin (epidermis, dermis, and hypodermis), as well as deeper tissues (fat, connective tissue and muscle) were infiltrated with a small amount of a short-acting local anesthetic, loaded on a 10cc syringe with a 25G, 1.5-in  Needle. An appropriate amount of time was allowed for local anesthetics to take effect before proceeding to the next step. Local Anesthetic: Lidocaine  2.0% The unused portion of the local anesthetic was discarded  in the proper designated containers. Technical explanation of process:   Radiofrequency Ablation (RFA) L3 Medial Branch Nerve RFA: The target area for the L3 medial branch is at the junction of the postero-lateral aspect of the superior articular process and the superior, posterior, and medial edge of the transverse process of L4. Under fluoroscopic guidance, a Radiofrequency needle was inserted until contact was made with os over the superior postero-lateral aspect of the pedicular shadow (target area). Sensory and motor testing was conducted to properly adjust the position of the needle. Once satisfactory placement of the needle was achieved, the numbing solution was slowly injected after negative aspiration for blood. 2.0 mL of the nerve block solution was injected without difficulty or complication. After waiting for at least 3 minutes, the ablation was performed. Once completed, the needle was removed intact. L4 Medial Branch Nerve RFA: The target area for the L4 medial branch is at the junction of the postero-lateral aspect of the superior articular process and the superior, posterior, and medial edge of the transverse process of L5. Under fluoroscopic guidance, a Radiofrequency needle was inserted until contact was made with os over the superior postero-lateral aspect of the pedicular shadow (target area). Sensory and motor testing was conducted to properly adjust the position of the needle. Once satisfactory placement of the needle was achieved, the numbing solution was slowly injected after negative aspiration for blood. 2.0 mL of the nerve block solution was injected without difficulty or complication. After waiting for at least 3 minutes, the ablation was performed. Once completed, the needle was removed intact. L5 Medial Branch Nerve RFA: The target area for the L5 medial branch is at the junction of the postero-lateral aspect of the superior articular process of S1 and the superior, posterior, and  medial edge of the sacral ala. Under fluoroscopic guidance, a Radiofrequency needle was inserted until contact was made with os over the superior postero-lateral aspect of the pedicular shadow (target area). Sensory and motor testing was conducted to properly adjust the position of the needle. Once satisfactory placement of the needle was achieved, the numbing solution was slowly  injected after negative aspiration for blood. 2.0 mL of the nerve block solution was injected without difficulty or complication. After waiting for at least 3 minutes, the ablation was performed. Once completed, the needle was removed intact.  Radiofrequency lesioning (ablation):  Radiofrequency Generator: NeuroTherm [id number] Sensory Stimulation Parameters: 50 Hz was used to locate & identify the nerve, making sure that the needle was positioned such that there was no sensory stimulation below 0.3 V or above 0.7 V. Motor Stimulation Parameters: 2 Hz was used to evaluate the motor component. Care was taken not to lesion any nerves that demonstrated motor stimulation of the lower extremities at an output of less than 2.5 times that of the sensory threshold, or a maximum of 2.0 V. Lesioning Technique Parameters: Standard Radiofrequency settings. (Not bipolar or pulsed.) Temperature Settings: 80 degrees C Lesioning time: 60 seconds Intra-operative Compliance: Compliant Materials & Medications: Needle(s) (Electrode/Cannula) Type: Teflon-coated, curved tip, Radiofrequency needle(s) Gauge: 22G Length: 10cm Numbing solution: 6 cc solution made of  5 cc of 0.2% ropivacaine , 1 cc of Decadron  10 mg/cc.  2 cc injected at each level above for  the RIGHT side after sensorimotor testing, prior to lesioning.                       6 cc solution made of  5 cc of 0.2% ropivacaine , 1 cc of Decadron  10 mg/cc.  2 cc injected at each level above for  the LEFT  side after sensorimotor testing, prior to lesioning.   Once the entire procedure was  completed, the treated area was cleaned, making sure to leave some of the prepping solution back to take advantage of its long term bactericidal properties.    Illustration of the posterior view of the lumbar spine and the posterior neural structures. Laminae of L2 through S1 are labeled. DPRL5, dorsal primary ramus of L5; DPRS1, dorsal primary ramus of S1; DPR3, dorsal primary ramus of L3; FJ, facet (zygapophyseal) joint L3-L4; I, inferior articular process of L4; LB1, lateral branch of dorsal primary ramus of L1; IAB, inferior articular branches from L3 medial branch (supplies L4-L5 facet joint); IBP, intermediate branch plexus; MB3, medial branch of dorsal primary ramus of L3; NR3, third lumbar nerve root; S, superior articular process of L5; SAB, superior articular branches from L4 (supplies L4-5 facet joint also); TP3, transverse process of L3.  Vitals:   06/20/24 0945 06/20/24 0950 06/20/24 0955 06/20/24 1003  BP: 132/77 126/68 132/73 114/66  Pulse:      Resp: 13 15 13 14   Temp:    97.8 F (36.6 C)  TempSrc:    Temporal  SpO2: 95% 97% 96% 97%  Weight:      Height:        Start Time: 0931 hrs. End Time: 0957 hrs.  Imaging Guidance (Spinal):          Type of Imaging Technique: Fluoroscopy Guidance (Spinal) Indication(s): Assistance in needle guidance and placement for procedures requiring needle placement in or near specific anatomical locations not easily accessible without such assistance. Exposure Time: Please see nurses notes. Contrast: None used. Fluoroscopic Guidance: I was personally present during the use of fluoroscopy. Tunnel Vision Technique used to obtain the best possible view of the target area. Parallax error corrected before commencing the procedure. Direction-depth-direction technique used to introduce the needle under continuous pulsed fluoroscopy. Once target was reached, antero-posterior, oblique, and lateral fluoroscopic projection used confirm needle placement  in all planes. Images permanently stored in  EMR. Interpretation: No contrast injected. I personally interpreted the imaging intraoperatively. Adequate needle placement confirmed in multiple planes. Permanent images saved into the patient's record.   Post-operative Assessment:  Post-procedure Vital Signs:  Pulse/HCG Rate: 7166 Temp: 97.8 F (36.6 C) Resp: 14 BP: 114/66 SpO2: 97 %  EBL: None  Complications: No immediate post-treatment complications observed by team, or reported by patient.  Note: The patient tolerated the entire procedure well. A repeat set of vitals were taken after the procedure and the patient was kept under observation following institutional policy, for this type of procedure. Post-procedural neurological assessment was performed, showing return to baseline, prior to discharge. The patient was provided with post-procedure discharge instructions, including a section on how to identify potential problems. Should any problems arise concerning this procedure, the patient was given instructions to immediately contact us , at any time, without hesitation. In any case, we plan to contact the patient by telephone for a follow-up status report regarding this interventional procedure.  Comments:  No additional relevant information.  Plan of Care  Orders:  Orders Placed This Encounter  Procedures   DG PAIN CL[facility] C-ARM 1-60 MIN NO REPORT    Intraoperative interpretation by procedural physician at [facility].    Standing Status:   Standing    Number of Occurrences:   1    Reason for exam::   Assistance in needle guidance and placement for procedures requiring needle placement in or near specific anatomical locations not easily accessible without such assistance.   Medications ordered for procedure: Meds ordered this encounter  Medications   lidocaine  (XYLOCAINE ) 2 % (with pres) injection 400 mg   lactated ringers  infusion   midazolam  PF (VERSED ) injection 0.5-2  mg    Make sure Flumazenil is available in the pyxis when using this medication. If oversedation occurs, administer 0.2 mg IV over 15 sec. If after 45 sec no response, administer 0.2 mg again over 1 min; may repeat at 1 min intervals; not to exceed 4 doses (1 mg)   ropivacaine  (PF) 2 mg/mL (0.2%) (NAROPIN ) injection 18 mL   dexamethasone  (DECADRON ) injection 20 mg   Medications administered: We administered lidocaine , lactated ringers , midazolam  PF, ropivacaine  (PF) 2 mg/mL (0.2%), and dexamethasone .  See the medical record for exact dosing, route, and time of administration.  Follow-up plan:   Return in about 8 weeks (around 08/15/2024) for PPE, F2F, Seema.     Recent Visits Date Type Provider Dept  05/24/24 Office Visit Patel, Seema K, NP [facility]  05/23/24 Office Visit Tobie Emmy POUR, NP [facility]  05/09/24 Office Visit Patel, Seema K, NP [facility]  04/20/24 Office Visit Patel, Seema K, NP [facility]  04/07/24 Procedure visit Marcelino Nurse, MD [facility]  04/04/24 Office Visit Marcelino Nurse, MD [facility]  Showing recent visits within past 90 days and meeting all other requirements Today's Visits Date Type Provider Dept  06/20/24 Procedure visit Marcelino Nurse, MD [facility]  Showing today's visits and meeting all other requirements Future Appointments Date Type Provider Dept  08/10/24 Appointment Patel, Seema K, NP [facility]  Showing future appointments within next 90 days and meeting all other requirements  Disposition: Discharge home  Discharge (Date  Time): 06/20/2024; 1007 hrs.   Primary Care Physician: Antonette Angeline ORN, NP Location: [facility] Note by: Nurse Marcelino, MD Date: 06/20/2024; Time: 10:13 AM  Disclaimer:  Medicine is not an exact science. The  only guarantee in medicine is that nothing is guaranteed. It is important to note that the  decision to proceed with this intervention was based on the information collected from the patient. The Data and conclusions were drawn from the patient's questionnaire, the interview, and the physical examination. Because the information was provided in large part by the patient, it cannot be guaranteed that it has not been purposely or unconsciously manipulated. Every effort has been made to obtain as much relevant data as possible for this evaluation. It is important to note that the conclusions that lead to this procedure are derived in large part from the available data. Always take into account that the treatment will also be dependent on availability of resources and existing treatment guidelines, considered by other Pain Management Practitioners as being common knowledge and practice, at the time of the intervention. For Medico-Legal purposes, it is also important to point out that variation in procedural techniques and pharmacological choices are the acceptable norm. The indications, contraindications, technique, and results of the above procedure should only be interpreted and judged by a Board-Certified Interventional Pain Specialist with extensive familiarity and expertise in the same exact procedure and technique.

## 2024-06-20 NOTE — Patient Instructions (Signed)

## 2024-06-21 ENCOUNTER — Other Ambulatory Visit: Payer: Self-pay | Admitting: Internal Medicine

## 2024-06-21 ENCOUNTER — Telehealth: Payer: Self-pay | Admitting: *Deleted

## 2024-06-21 NOTE — Telephone Encounter (Signed)
 Requested Prescriptions  Pending Prescriptions Disp Refills   enalapril  (VASOTEC ) 20 MG tablet [Pharmacy Med Name: Enalapril  Maleate 20 MG Oral Tablet] 180 tablet 0    Sig: Take 1 tablet by mouth twice daily     Cardiovascular:  ACE Inhibitors Passed - 06/21/2024  8:43 AM      Passed - Cr in normal range and within 180 days    Creat  Date Value Ref Range Status  11/27/2023 0.67 0.50 - 1.05 mg/dL Final   Creatinine, Ser  Date Value Ref Range Status  03/15/2024 0.71 0.44 - 1.00 mg/dL Final   Creatinine, Urine  Date Value Ref Range Status  11/27/2023 85 20 - 275 mg/dL Final         Passed - K in normal range and within 180 days    Potassium  Date Value Ref Range Status  03/15/2024 3.8 3.5 - 5.1 mmol/L Final         Passed - Patient is not pregnant      Passed - Last BP in normal range    BP Readings from Last 1 Encounters:  06/20/24 114/66         Passed - Valid encounter within last 6 months    Recent Outpatient Visits           3 months ago Bilateral primary osteoarthritis of knee   [place] [facility] [place], Angeline ORN, NP   6 months ago Urinary urgency   [place] [facility] [place], Angeline ORN, NP   6 months ago DM type 2 with diabetic peripheral neuropathy [facility])   [place] [facility] [place], Angeline ORN, NP               fenofibrate  54 MG tablet [Pharmacy Med Name: Fenofibrate  54 MG Oral Tablet] 90 tablet 0    Sig: Take 1 tablet by mouth once daily     Cardiovascular:  Antilipid - Fibric Acid Derivatives Failed - 06/21/2024  8:43 AM      Failed - HGB in normal range and within 360 days    Hemoglobin  Date Value Ref Range Status  03/15/2024 11.1 (L) 12.0 - 15.0 g/dL Final  96/68/7982 87.7 11.1 - 15.9 g/dL Final         Failed - HCT in normal range and within 360 days    HCT  Date Value Ref Range Status  03/15/2024 34.6 (L) 36.0 - 46.0 % Final   Hematocrit  Date Value Ref Range Status  11/30/2015  38.0 34.0 - 46.6 % Final         Failed - WBC in normal range and within 360 days    WBC  Date Value Ref Range Status  03/15/2024 13.5 (H) 4.0 - 10.5 K/uL Final         Failed - Lipid Panel in normal range within the last 12 months    Cholesterol, Total  Date Value Ref Range Status  11/30/2015 182 100 - 199 mg/dL Final   Cholesterol  Date Value Ref Range Status  11/27/2023 207 (H) <200 mg/dL Final   LDL Cholesterol (Calc)  Date Value Ref Range Status  11/27/2023 125 (H) mg/dL (calc) Final    Comment:    Reference range: <100 . Desirable range <100 mg/dL for primary prevention;   <70 mg/dL for patients with CHD or diabetic patients  with > or = 2 CHD risk factors. SABRA LDL-C is now calculated using the Martin-Hopkins  calculation, which is a validated novel method providing  better accuracy than the Friedewald equation in the  estimation of LDL-C.  Gladis APPLETHWAITE et al. SANDREA. 7986;689(80): 2061-2068  (http://education.[employer].com/faq/FAQ164)    HDL  Date Value Ref Range Status  11/27/2023 55 > OR = 50 mg/dL Final  96/68/7982 47 >60 mg/dL Final   Triglycerides  Date Value Ref Range Status  11/27/2023 158 (H) <150 mg/dL Final         Passed - ALT in normal range and within 360 days    ALT  Date Value Ref Range Status  03/01/2024 14 0 - 44 U/L Final         Passed - AST in normal range and within 360 days    AST  Date Value Ref Range Status  03/01/2024 15 15 - 41 U/L Final         Passed - Cr in normal range and within 360 days    Creat  Date Value Ref Range Status  11/27/2023 0.67 0.50 - 1.05 mg/dL Final   Creatinine, Ser  Date Value Ref Range Status  03/15/2024 0.71 0.44 - 1.00 mg/dL Final   Creatinine, Urine  Date Value Ref Range Status  11/27/2023 85 20 - 275 mg/dL Final         Passed - PLT in normal range and within 360 days    Platelets  Date Value Ref Range Status  03/15/2024 289 150 - 400 K/uL Final  11/30/2015 428 (H) 150 - 379  x10E3/uL Final         Passed - eGFR is 30 or above and within 360 days    GFR, Est African American  Date Value Ref Range Status  03/22/2019 102 > OR = 60 mL/min/1.10m2 Final   GFR, Est Non African American  Date Value Ref Range Status  03/22/2019 88 > OR = 60 mL/min/1.84m2 Final   GFR, Estimated  Date Value Ref Range Status  03/15/2024 >60 >60 mL/min Final    Comment:    (NOTE) Calculated using the CKD-EPI Creatinine Equation (2021)    eGFR  Date Value Ref Range Status  11/27/2023 96 > OR = 60 mL/min/1.26m2 Final         Passed - Valid encounter within last 12 months    Recent Outpatient Visits           3 months ago Bilateral primary osteoarthritis of knee   [facility] [place], Angeline ORN, NP   6 months ago Urinary urgency   [place] [facility] [place], Angeline ORN, NP   6 months ago DM type 2 with diabetic peripheral neuropathy [facility])    [facility] [place], Angeline ORN, NP               JARDIANCE  25 MG TABS tablet [Pharmacy Med Name: Jardiance  25 MG Oral Tablet] 90 tablet 0    Sig: Take 1 tablet by mouth once daily     Endocrinology:  Diabetes - SGLT2 Inhibitors Failed - 06/21/2024  8:43 AM      Failed - HBA1C is between 0 and 7.9 and within 180 days    Hemoglobin A1C  Date Value Ref Range Status  01/06/2017 8.0  Final   Hgb A1c MFr Bld  Date Value Ref Range Status  11/27/2023 6.4 (H) <5.7 % of total Hgb Final    Comment:    For someone without known diabetes, a hemoglobin  A1c value  between 5.7% and 6.4% is consistent with prediabetes and should be confirmed with a  follow-up test. . For someone with known diabetes, a value <7% indicates that their diabetes is well controlled. A1c targets should be individualized based on duration of diabetes, age, comorbid conditions, and other considerations. . This assay result is consistent with an increased risk of diabetes. . Currently, no  consensus exists regarding use of hemoglobin A1c for diagnosis of diabetes for children. .          Passed - Cr in normal range and within 360 days    Creat  Date Value Ref Range Status  11/27/2023 0.67 0.50 - 1.05 mg/dL Final   Creatinine, Ser  Date Value Ref Range Status  03/15/2024 0.71 0.44 - 1.00 mg/dL Final   Creatinine, Urine  Date Value Ref Range Status  11/27/2023 85 20 - 275 mg/dL Final         Passed - eGFR in normal range and within 360 days    GFR, Est African American  Date Value Ref Range Status  03/22/2019 102 > OR = 60 mL/min/1.74m2 Final   GFR, Est Non African American  Date Value Ref Range Status  03/22/2019 88 > OR = 60 mL/min/1.48m2 Final   GFR, Estimated  Date Value Ref Range Status  03/15/2024 >60 >60 mL/min Final    Comment:    (NOTE) Calculated using the CKD-EPI Creatinine Equation (2021)    eGFR  Date Value Ref Range Status  11/27/2023 96 > OR = 60 mL/min/1.88m2 Final         Passed - Valid encounter within last 6 months    Recent Outpatient Visits           3 months ago Bilateral primary osteoarthritis of knee   [place] [facility] [place], Angeline ORN, NP   6 months ago Urinary urgency   [place] [facility] [place], [place] W, NP   6 months ago DM type 2 with diabetic peripheral neuropathy [facility])   [place] [facility] [place], Angeline ORN, NP               JANUVIA  100 MG tablet [Pharmacy Med Name: Januvia  100 MG Oral Tablet] 90 tablet 0    Sig: Take 1 tablet by mouth once daily     Endocrinology:  Diabetes - DPP-4 Inhibitors Failed - 06/21/2024  8:43 AM      Failed - HBA1C is between 0 and 7.9 and within 180 days    Hemoglobin A1C  Date Value Ref Range Status  01/06/2017 8.0  Final   Hgb A1c MFr Bld  Date Value Ref Range Status  11/27/2023 6.4 (H) <5.7 % of total Hgb Final    Comment:    For someone without known diabetes, a hemoglobin  A1c value between 5.7% and 6.4% is  consistent with prediabetes and should be confirmed with a  follow-up test. . For someone with known diabetes, a value <7% indicates that their diabetes is well controlled. A1c targets should be individualized based on duration of diabetes, age, comorbid conditions, and other considerations. . This assay result is consistent with an increased risk of diabetes. . Currently, no consensus exists regarding use of hemoglobin A1c for diagnosis of diabetes for children. .          Passed - Cr in normal range and within 360 days    Creat  Date Value Ref Range Status  11/27/2023 0.67 0.50 -  1.05 mg/dL Final   Creatinine, Ser  Date Value Ref Range Status  03/15/2024 0.71 0.44 - 1.00 mg/dL Final   Creatinine, Urine  Date Value Ref Range Status  11/27/2023 85 20 - 275 mg/dL Final         Passed - Valid encounter within last 6 months    Recent Outpatient Visits           3 months ago Bilateral primary osteoarthritis of knee   [place] [facility] [place], Angeline ORN, NP   6 months ago Urinary urgency   [place] [facility] [place], [place] W, NP   6 months ago DM type 2 with diabetic peripheral neuropathy [facility])   [place] [facility] [place], Angeline ORN, [place]

## 2024-06-21 NOTE — Telephone Encounter (Signed)
 No problems post procedure.

## 2024-06-23 NOTE — Telephone Encounter (Signed)
 Requested Prescriptions  Pending Prescriptions Disp Refills   LANTUS  SOLOSTAR 100 UNIT/ML Solostar Pen [Pharmacy Med Name: Lantus  SoloStar 100 UNIT/ML Subcutaneous Solution Pen-injector] 15 mL 0    Sig: INJECT 34 UNITS SUBCUTANEOUSLY ONCE DAILY     Endocrinology:  Diabetes - Insulins Failed - 06/23/2024  2:18 PM      Failed - HBA1C is between 0 and 7.9 and within 180 days    Hemoglobin A1C  Date Value Ref Range Status  01/06/2017 8.0  Final   Hgb A1c MFr Bld  Date Value Ref Range Status  11/27/2023 6.4 (H) <5.7 % of total Hgb Final    Comment:    For someone without known diabetes, a hemoglobin  A1c value between 5.7% and 6.4% is consistent with prediabetes and should be confirmed with a  follow-up test. . For someone with known diabetes, a value <7% indicates that their diabetes is well controlled. A1c targets should be individualized based on duration of diabetes, age, comorbid conditions, and other considerations. . This assay result is consistent with an increased risk of diabetes. . Currently, no consensus exists regarding use of hemoglobin A1c for diagnosis of diabetes for children. SABRA Amy - Valid encounter within last 6 months    Recent Outpatient Visits           3 months ago Bilateral primary osteoarthritis of knee   [place] [facility] [place], Angeline ORN, NP   6 months ago Urinary urgency   [place] [facility] [place], [place] W, NP   6 months ago DM type 2 with diabetic peripheral neuropathy [facility])   [place] [facility] [place], Angeline ORN, [place]

## 2024-06-26 ENCOUNTER — Other Ambulatory Visit: Payer: Self-pay | Admitting: Internal Medicine

## 2024-06-26 DIAGNOSIS — I10 Essential (primary) hypertension: Secondary | ICD-10-CM

## 2024-06-28 NOTE — Telephone Encounter (Signed)
 Requested Prescriptions  Pending Prescriptions Disp Refills   furosemide  (LASIX ) 40 MG tablet [Pharmacy Med Name: Furosemide  40 MG Oral Tablet] 90 tablet 0    Sig: Take 1 tablet (40 mg total) by mouth daily. OFFICE VISIT NEEDED FOR ADDITIONAL REFILLS     Cardiovascular:  Diuretics - Loop Failed - 06/28/2024 12:39 PM      Failed - Ca in normal range and within 180 days    Calcium   Date Value Ref Range Status  03/15/2024 8.4 (L) 8.9 - 10.3 mg/dL Final         Failed - Mg Level in normal range and within 180 days    No results found for: MG       Passed - K in normal range and within 180 days    Potassium  Date Value Ref Range Status  03/15/2024 3.8 3.5 - 5.1 mmol/L Final         Passed - Na in normal range and within 180 days    Sodium  Date Value Ref Range Status  03/15/2024 136 135 - 145 mmol/L Final  11/30/2015 136 134 - 144 mmol/L Final         Passed - Cr in normal range and within 180 days    Creat  Date Value Ref Range Status  11/27/2023 0.67 0.50 - 1.05 mg/dL Final   Creatinine, Ser  Date Value Ref Range Status  03/15/2024 0.71 0.44 - 1.00 mg/dL Final   Creatinine, Urine  Date Value Ref Range Status  11/27/2023 85 20 - 275 mg/dL Final         Passed - Cl in normal range and within 180 days    Chloride  Date Value Ref Range Status  03/15/2024 108 98 - 111 mmol/L Final         Passed - Last BP in normal range    BP Readings from Last 1 Encounters:  06/20/24 114/66         Passed - Valid encounter within last 6 months    Recent Outpatient Visits           3 months ago Bilateral primary osteoarthritis of knee   [place] [facility] [place], Angeline ORN, NP   7 months ago Urinary urgency   [place] [facility] [place], Angeline ORN, NP   7 months ago DM type 2 with diabetic peripheral neuropathy [facility])   [place] [facility] [place], Angeline ORN, NP

## 2024-07-26 ENCOUNTER — Other Ambulatory Visit: Payer: Self-pay | Admitting: Internal Medicine

## 2024-07-26 DIAGNOSIS — I10 Essential (primary) hypertension: Secondary | ICD-10-CM

## 2024-07-27 NOTE — Telephone Encounter (Signed)
 Requested Prescriptions  Pending Prescriptions Disp Refills   amLODipine  (NORVASC ) 10 MG tablet [Pharmacy Med Name: amLODIPine  Besylate 10 MG Oral Tablet] 90 tablet 0    Sig: Take 1 tablet by mouth once daily     Cardiovascular: Calcium  Channel Blockers 2 Passed - 07/27/2024  1:26 PM      Passed - Last BP in normal range    BP Readings from Last 1 Encounters:  06/20/24 114/66         Passed - Last Heart Rate in normal range    Pulse Readings from Last 1 Encounters:  06/20/24 71         Passed - Valid encounter within last 6 months    Recent Outpatient Visits           4 months ago Bilateral primary osteoarthritis of knee   [place] [facility] [place], Angeline ORN, NP   7 months ago Urinary urgency   [place] [facility] [place], [place] W, NP   8 months ago DM type 2 with diabetic peripheral neuropathy [facility])   [place] [facility] [place], Angeline ORN, [place]

## 2024-08-06 DIAGNOSIS — M47816 Spondylosis without myelopathy or radiculopathy, lumbar region: Secondary | ICD-10-CM | POA: Insufficient documentation

## 2024-08-06 DIAGNOSIS — M5416 Radiculopathy, lumbar region: Secondary | ICD-10-CM | POA: Insufficient documentation

## 2024-08-09 NOTE — Progress Notes (Unsigned)
 PROVIDER NOTE: Interpretation of information contained herein should be left to medically-trained personnel. Specific patient instructions are provided elsewhere under Patient Instructions section of medical record. This document was created in part using AI and STT-dictation technology, any transcriptional errors that may result from this process are unintentional.  Patient: Stacy Martinez  Service: E/M   PCP: Antonette Angeline ORN, NP  DOB: 1955-10-20  DOS: 08/10/2024  Provider: Emmy MARLA Blanch, NP  MRN: [id number]  Delivery: Face-to-face  Specialty: Interventional Pain Management  Type: Established Patient  Setting: Ambulatory outpatient facility  Specialty designation: 09  Referring Prov.: Antonette Angeline ORN, NP  Location: Outpatient office facility       History of present illness (HPI) [name], a 68 y.o. year old female, is here today because of her Lumbar spondylosis [M47.816]. [name]'s primary complain today is No chief complaint on file.  Pertinent problems: [name] has DM type 2 with diabetic peripheral neuropathy (HCC); Spondylosis without myelopathy or radiculopathy, lumbar region; Chronic pain syndrome; Class 3 severe obesity due to excess calories with body mass index (BMI) of 40.0 to 44.9 in adult [facility]); Bilateral primary osteoarthritis of knee; and Type 2 diabetes mellitus (HCC) on their pertinent problem list.  Pain Assessment: Severity of   is reported as a  /10. Location:    / . Onset:  . Quality:  . Timing:  . Modifying factor(s):  SABRA Vitals:  vitals were not taken for this visit.  BMI: Estimated body mass index is 39.14 kg/m as calculated from the following:   Height as of 06/20/24: 5' 2 (1.575 m).   Weight as of 06/20/24: 214 lb (97.1 kg).  Last encounter: 05/24/2024. Last procedure: Visit date not found.  Reason for encounter: both, medication management and post-procedure evaluation and assessment.   Discussed the use of AI scribe software for clinical note transcription  with the patient, who gave verbal consent to proceed.  History of Present Illness          Procedure:           Anesthesia, Analgesia, Anxiolysis:  Type: Thermal Lumbar Facet, Medial Branch Radiofrequency Ablation/Neurotomy           Primary Purpose: Therapeutic Region: Posterolateral Lumbosacral Spine Level: L3, L4, L5, Medial Branch Level(s). These levels will denervate the L3-4, L4-5, lumbar facet joints. Laterality: Bilateral   Type: minimal sedation IV Versed  Indication(s): Anxiety Route: Intravenous (IV) IV Access: Secured Sedation: Meaningful verbal contact was maintained at all times during the procedure  Local Anesthetic: Lidocaine  1-2%   Position: Prone    Indications: 1. Lumbar spondylosis   2. Spondylosis without myelopathy or radiculopathy, lumbar region   3. Facet syndrome, lumbar     [name] has been dealing with the above chronic pain for longer than three months and has either failed to respond, was unable to tolerate, or simply did not get enough benefit from other more conservative therapies including, but not limited to: 1. Over-the-counter medications 2. Anti-inflammatory medications 3. Muscle relaxants 4. Membrane stabilizers 5. Opioids 6. Physical therapy and/or chiropractic manipulation 7. Modalities (Heat, ice, etc.) 8. Invasive techniques such as nerve blocks. [name] has attained more than 50% relief of the pain from a series of diagnostic injections conducted in separate occasions.   Pain Score: Pre-procedure: 8 /10 Post-procedure: 0-No pain/10   Effectiveness:  Initial hour after procedure:   ***. Subsequent 4-6 hours post-procedure:   ***. Analgesia past initial 6 hours:   ***. Ongoing  improvement:  Analgesic:  *** Function: {Blank single:19197::No benefit,No improvement,Back to baseline,Transient improvement,[name] reports improvement in function,Somewhat improved,Minimal improvement,   ***   } ROM: {Blank  single:19197::No benefit,No improvement,Back to baseline,Transient improvement,[name] reports improvement in ROM,Somewhat improved,Minimal improvement,   ***   } Interpretation: ***  Pharmacotherapy Assessment   Monitoring: [place] PMP: PDMP reviewed during this encounter.       Pharmacotherapy: No side-effects or adverse reactions reported. Compliance: No problems identified. Effectiveness: Clinically acceptable.  No notes on file  UDS:  No results found for: SUMMARY  No results found for: CBDTHCR No results found for: D8THCCBX No results found for: D9THCCBX  ROS  Constitutional: Denies any fever or chills Gastrointestinal: No reported hemesis, hematochezia, vomiting, or acute GI distress Musculoskeletal: Denies any acute onset joint swelling, redness, loss of ROM, or weakness Neurological: No reported episodes of acute onset apraxia, aphasia, dysarthria, agnosia, amnesia, paralysis, loss of coordination, or loss of consciousness  Medication Review  Insulin  Pen Needle, ReliOn Ultra Thin Lancets, acetaminophen , amLODipine , aspirin  EC, bisacodyl , carvedilol , diclofenac , docusate sodium , empagliflozin , enalapril , enoxaparin , fenofibrate , ferrous sulfate, furosemide , gabapentin , glucose blood, insulin  glargine, ondansetron , oxyCODONE , sitaGLIPtin , tiZANidine , tirzepatide , and traMADol   History Review  Allergy: [name] is allergic to metformin  and related. Drug: [name]  reports no history of drug use. Alcohol:  reports no history of alcohol use. Tobacco:  reports that she has never smoked. She has never used smokeless tobacco. Social: [name]  reports that she has never smoked. She has never used smokeless tobacco. She reports that she does not drink alcohol and does not use drugs. Medical:  has a past medical history of Abdominal hernia s/p mesh repair with recurrence and second repair, Allergy, Anemia, Aortic atherosclerosis, Arthritis, DDD (degenerative disc  disease), lumbar, Enlarged heart, Hepatic steatosis, HFrEF (heart failure with reduced ejection fraction) (HCC), HLD (hyperlipidemia), Hypertension, LBBB (left bundle branch block), Peripartum cardiomyopathy, and T2DM (type 2 diabetes mellitus) (HCC). Surgical: Ms. Gabrys  has a past surgical history that includes Wrist fracture surgery (Right, 2013); Cesarean section (N/A, 1999); Reverse shoulder arthroplasty (Right, 08/27/2020); Total hip arthroplasty (Right, 11/29/2021); Abdominal hernia repair (2001); RECURRENT ABDOMINAL HERNIA REPAIR (2006); Cesarean section (N/A, 2001); and Total knee arthroplasty (Left, 03/14/2024). Family: family history includes Cancer in her father; Diabetes in her mother; Mental illness in her son.  Laboratory Chemistry Profile   Renal Lab Results  Component Value Date   BUN 18 03/15/2024   CREATININE 0.71 03/15/2024   LABCREA 85 11/27/2023   BCR SEE NOTE: 11/27/2023   GFRAA 102 03/22/2019   GFRNONAA >60 03/15/2024    Hepatic Lab Results  Component Value Date   AST 15 03/01/2024   ALT 14 03/01/2024   ALBUMIN 3.6 03/01/2024   ALKPHOS 58 03/01/2024   LIPASE 19 12/18/2013    Electrolytes Lab Results  Component Value Date   NA 136 03/15/2024   K 3.8 03/15/2024   CL 108 03/15/2024   CALCIUM  8.4 (L) 03/15/2024    Bone No results found for: VD25OH, CI874NY7UNU, CI6874NY7, CI7874NY7, 25OHVITD1, 25OHVITD2, 25OHVITD3, TESTOFREE, TESTOSTERONE  Inflammation (CRP: Acute Phase) (ESR: Chronic Phase) Lab Results  Component Value Date   LATICACIDVEN 1.72 01/02/2014         Note: Above Lab results reviewed.  Recent Imaging Review  DG PAIN CLINIC C-ARM 1-60 MIN NO REPORT Fluoro was used, but no Radiologist interpretation will be provided.  Please refer to NOTES tab for provider progress note. Note: Reviewed  Physical Exam  Vitals: There were no vitals taken for this visit. BMI: Estimated body mass index is 39.14 kg/m as calculated  from the following:   Height as of 06/20/24: 5' 2 (1.575 m).   Weight as of 06/20/24: 214 lb (97.1 kg). Ideal: Patient weight not recorded General appearance: Well nourished, well developed, and well hydrated. In no apparent acute distress Mental status: Alert, oriented x 3 (person, place, & time)       Respiratory: No evidence of acute respiratory distress Eyes: PERLA   Assessment   Diagnosis Status  1. Lumbar spondylosis   2. Spondylosis without myelopathy or radiculopathy, lumbar region   3. Facet syndrome, lumbar   4. Lumbar radiculopathy   5. Right leg pain   6. Chronic pain syndrome   7. Bilateral primary osteoarthritis of knee    Controlled Controlled Controlled   Updated Problems: No problems updated.  Plan of Care  Problem-specific:  Assessment and Plan            [name] has a current medication list which includes the following long-term medication(s): amlodipine , carvedilol , enalapril , enoxaparin , fenofibrate , ferrous sulfate, furosemide , gabapentin , januvia , and lantus  solostar.  Pharmacotherapy (Medications Ordered): No orders of the defined types were placed in this encounter.  Orders:  No orders of the defined types were placed in this encounter.    {There is no content from the last Plan section.}   No follow-ups on file.    Recent Visits Date Type Provider Dept  06/20/24 Procedure visit Marcelino Nurse, MD [facility]  05/24/24 Office Visit Kathlean Cinco K, NP [facility]  05/23/24 Office Visit Joseandres Mazer K, NP [facility]  Showing recent visits within past 90 days and meeting all other requirements Future Appointments Date Type Provider Dept  08/10/24 Appointment Felissa Blouch K, NP [facility]  Showing future appointments within next 90 days and meeting all other requirements  I discussed the assessment and treatment plan with the patient. The patient was provided an opportunity to ask  questions and all were answered. The patient agreed with the plan and demonstrated an understanding of the instructions.  Patient advised to call back or seek an in-person evaluation if the symptoms or condition worsens.  Duration of encounter: *** minutes.  Total time on encounter, as per AMA guidelines included both the face-to-face and non-face-to-face time personally spent by the physician and/or other qualified health care professional(s) on the day of the encounter (includes time in activities that require the physician or other qualified health care professional and does not include time in activities normally performed by clinical staff). Physician's time may include the following activities when performed: Preparing to see the patient (e.g., pre-charting review of records, searching for previously ordered imaging, lab work, and nerve conduction tests) Review of prior analgesic pharmacotherapies. Reviewing PMP Interpreting ordered tests (e.g., lab work, imaging, nerve conduction tests) Performing post-procedure evaluations, including interpretation of diagnostic procedures Obtaining and/or reviewing separately obtained history Performing a medically appropriate examination and/or evaluation Counseling and educating the patient/family/caregiver Ordering medications, tests, or procedures Referring and communicating with other health care professionals (when not separately reported) Documenting clinical information in the electronic or other health record Independently interpreting results (not separately reported) and communicating results to the patient/ family/caregiver Care coordination (not separately reported)  Note by: Maguadalupe Lata K Jose Corvin, NP (TTS and AI technology used. I apologize for any typographical errors that were not detected and corrected.) Date: 08/10/2024; Time: 1:11  PM

## 2024-08-10 ENCOUNTER — Encounter: Payer: Self-pay | Admitting: Nurse Practitioner

## 2024-08-10 ENCOUNTER — Ambulatory Visit: Attending: Nurse Practitioner | Admitting: Nurse Practitioner

## 2024-08-10 VITALS — BP 167/70 | HR 78 | Temp 98.0°F | Resp 20 | Ht 62.0 in | Wt 215.0 lb

## 2024-08-10 DIAGNOSIS — M47816 Spondylosis without myelopathy or radiculopathy, lumbar region: Secondary | ICD-10-CM | POA: Insufficient documentation

## 2024-08-10 DIAGNOSIS — M170 Bilateral primary osteoarthritis of knee: Secondary | ICD-10-CM

## 2024-08-10 DIAGNOSIS — G894 Chronic pain syndrome: Secondary | ICD-10-CM | POA: Diagnosis not present

## 2024-08-10 DIAGNOSIS — M79604 Pain in right leg: Secondary | ICD-10-CM | POA: Diagnosis not present

## 2024-08-10 DIAGNOSIS — M5416 Radiculopathy, lumbar region: Secondary | ICD-10-CM | POA: Diagnosis not present

## 2024-08-10 MED ORDER — DICLOFENAC SODIUM 75 MG PO TBEC: 75.0000 mg | DELAYED_RELEASE_TABLET | Freq: Two times a day (BID) | ORAL | 1 refills | Status: AC | PRN

## 2024-08-10 MED ORDER — GABAPENTIN 300 MG PO CAPS: 300.0000 mg | ORAL_CAPSULE | Freq: Three times a day (TID) | ORAL | 1 refills | Status: AC

## 2024-08-10 MED ORDER — TIZANIDINE HCL 4 MG PO TABS: 4.0000 mg | ORAL_TABLET | Freq: Three times a day (TID) | ORAL | 1 refills | Status: AC | PRN

## 2024-08-10 NOTE — Progress Notes (Signed)
 Safety precautions to be maintained throughout the outpatient stay will include: orient to surroundings, keep bed in low position, maintain call bell within reach at all times, provide assistance with transfer out of bed and ambulation.

## 2024-08-10 NOTE — Patient Instructions (Signed)
 Radiofrequency Ablation Radiofrequency ablation is a procedure that is performed to relieve pain. The procedure is often used for back, neck, or arm pain. Radiofrequency ablation involves the use of a machine that creates radio waves to make heat. During the procedure, the heat is applied to the nerve that carries the pain signal. The heat damages the nerve and interferes with the pain signal. Pain relief usually starts about 2 weeks after the procedure and lasts for 6 months to 1 year. Tell a health care provider about: Any allergies you have. All medicines you are taking, including vitamins, herbs, eye drops, creams, and over-the-counter medicines. Any problems you or family members have had with anesthetic medicines. Any bleeding problems you have. Any surgeries you have had. Any medical conditions you have. Whether you are pregnant or may be pregnant. What are the risks? Generally, this is a safe procedure. However, problems may occur, including: Pain or soreness at the injection site. Allergic reaction to medicines given during the procedure. Bleeding. Infection at the injection site. Damage to nerves or blood vessels. What happens before the procedure? When to stop eating and drinking Follow instructions from your health care provider about what you may eat and drink before your procedure. These may include: 8 hours before the procedure Stop eating most foods. Do not eat meat, fried foods, or fatty foods. Eat only light foods, such as toast or crackers. All liquids are okay except energy drinks and alcohol. 6 hours before the procedure Stop eating. Drink only clear liquids, such as water, clear fruit juice, black coffee, plain tea, and sports drinks. Do not drink energy drinks or alcohol. 2 hours before the procedure Stop drinking all liquids. You may be allowed to take medicine with small sips of water. If you do not follow your health care provider's instructions, your  procedure may be delayed or canceled. Medicines Ask your health care provider about: Changing or stopping your regular medicines. This is especially important if you are taking diabetes medicines or blood thinners. Taking medicines such as aspirin  and ibuprofen. These medicines can thin your blood. Do not take these medicines unless your health care provider tells you to take them. Taking over-the-counter medicines, vitamins, herbs, and supplements. General instructions Ask your health care provider what steps will be taken to help prevent infection. These steps may include: Removing hair at the procedure site. Washing skin with a germ-killing soap. Taking antibiotic medicine. If you will be going home right after the procedure, plan to have a responsible adult: Take you home from the hospital or clinic. You will not be allowed to drive. Care for you for the time you are told. What happens during the procedure?  You will be awake during the procedure. You will need to be able to talk with the health care provider during the procedure. An IV will be inserted into one of your veins. You will be given one or more of the following: A medicine to help you relax (sedative). A medicine to numb the area (local anesthetic). Your health care provider will insert a radiofrequency needle into the area to be treated. This is done with the help of fluoroscopy. A wire that carries the radio waves (electrode) will be put through the radiofrequency needle. An electrical pulse will be sent through the electrode to verify the correct nerve that is causing your pain. You will feel a tingling sensation, and you may have muscle twitching. The tissue around the needle tip will be heated by an  electric current that comes from the radiofrequency machine. This will numb the nerves. The needle will be removed. A bandage (dressing) will be put on the insertion area. The procedure may vary among health care providers  and hospitals. What happens after the procedure? Your blood pressure, heart rate, breathing rate, and blood oxygen level will be monitored until you leave the hospital or clinic. Return to your normal activities as told by your health care provider. Ask your health care provider what activities are safe for you. If you were given a sedative during the procedure, it can affect you for several hours. Do not drive or operate machinery until your health care provider says that it is safe. Summary Radiofrequency ablation is a procedure that is performed to relieve pain. The procedure is often used for back, neck, or arm pain. Radiofrequency ablation involves the use of a machine that creates radio waves to make heat. Plan to have a responsible adult take you home from the hospital or clinic. Do not drive or operate machinery until your health care provider says that it is safe. Return to your normal activities as told by your health care provider. Ask your health care provider what activities are safe for you. This information is not intended to replace advice given to you by your health care provider. Make sure you discuss any questions you have with your health care provider. Document Revised: 02/05/2021 Document Reviewed: 02/05/2021 Elsevier Patient Education  2024 [employer]  Moderate Conscious Sedation, Adult, Care After After the procedure, it is common to have: Sleepiness for a few hours. Impaired judgment for a few hours. Trouble with balance. Nausea or vomiting if you eat too soon. Follow these instructions at home: For the time period you were told by your health care provider:  Rest. Do not participate in activities where you could fall or become injured. Do not drive or use machinery. Do not drink alcohol. Do not take sleeping pills or medicines that cause drowsiness. Do not make important decisions or sign legal documents. Do not take care of children on your own. Eating and  drinking Follow instructions from your health care provider about what you may eat and drink. Drink enough fluid to keep your urine pale yellow. If you vomit: Drink clear fluids slowly and in small amounts as you are able. Clear fluids include water, ice chips, low-calorie sports drinks, and fruit juice that has water added to it (diluted fruit juice). Eat light and bland foods in small amounts as you are able. These foods include bananas, applesauce, rice, lean meats, toast, and crackers. General instructions Take over-the-counter and prescription medicines only as told by your health care provider. Have a responsible adult stay with you for the time you are told. Do not use any products that contain nicotine or tobacco. These products include cigarettes, chewing tobacco, and vaping devices, such as e-cigarettes. If you need help quitting, ask your health care provider. Return to your normal activities as told by your health care provider. Ask your health care provider what activities are safe for you. Your health care provider may give you more instructions. Make sure you know what you can and cannot do. Contact a health care provider if: You are still sleepy or having trouble with balance after 24 hours. You feel light-headed. You vomit every time you eat or drink. You get a rash. You have a fever. You have redness or swelling around the IV site. Get help right away if: You have  trouble breathing. You start to feel confused at home. These symptoms may be an emergency. Get help right away. Call 911. Do not wait to see if the symptoms will go away. Do not drive yourself to the hospital. This information is not intended to replace advice given to you by your health care provider. Make sure you discuss any questions you have with your health care provider. Document Revised: 03/03/2022 Document Reviewed: 03/03/2022 Elsevier Patient Education  2024 [employer].

## 2024-08-16 NOTE — Progress Notes (Unsigned)
 Subjective:    Patient ID: Stacy Martinez, female    DOB: Jul 30, 1956, 68 y.o.   MRN: [id number]  HPI  Patient presents to clinic today for her annual exam.  Flu: 05/2023 Tetanus: 10/2019 COVID: Pneumovax: 06/2019 Prevnar: 06/2021 Shingrix: Pap smear: Mammogram: Bone density: Colon screening: Vision screening: Dentist:  Diet: Exercise:   Review of Systems   Past Medical History:  Diagnosis Date   Abdominal hernia s/p mesh repair with recurrence and second repair    Allergy    Anemia    Aortic atherosclerosis    Arthritis    DDD (degenerative disc disease), lumbar    Enlarged heart    Hepatic steatosis    HFrEF (heart failure with reduced ejection fraction) (HCC)    HLD (hyperlipidemia)    Hypertension    LBBB (left bundle branch block)    Peripartum cardiomyopathy    T2DM (type 2 diabetes mellitus) (HCC)     Current Outpatient Medications  Medication Sig Dispense Refill   acetaminophen  (TYLENOL ) 500 MG tablet Take 2 tablets (1,000 mg total) by mouth every 8 (eight) hours. 30 tablet 0   amLODipine  (NORVASC ) 10 MG tablet Take 1 tablet by mouth once daily 90 tablet 0   aspirin  EC 81 MG tablet Take 81 mg by mouth daily. Swallow whole. (Patient taking differently: Take 81 mg by mouth in the morning and at bedtime. Swallow whole.)     bisacodyl  (DULCOLAX) 5 MG EC tablet Take 5 mg by mouth daily as needed for moderate constipation.     carvedilol  (COREG ) 25 MG tablet TAKE 1 TABLET BY MOUTH TWICE DAILY WITH MEALS APPOINTMENT  REQUIRED  FOR  FUTURE  REFILLS 180 tablet 0   diclofenac  (VOLTAREN ) 75 MG EC tablet Take 1 tablet (75 mg total) by mouth every 12 (twelve) hours as needed for moderate pain (pain score 4-6). 60 tablet 1   docusate sodium  (COLACE) 100 MG capsule Take 1 capsule (100 mg total) by mouth 2 (two) times daily. 10 capsule 0   enalapril  (VASOTEC ) 20 MG tablet Take 1 tablet by mouth twice daily 180 tablet 0   enoxaparin  (LOVENOX ) 40 MG/0.4ML injection Inject  0.4 mLs (40 mg total) into the skin daily for 14 days. 5.6 mL 0   fenofibrate  54 MG tablet Take 1 tablet by mouth once daily 90 tablet 0   ferrous sulfate 325 (65 FE) MG tablet Take 325 mg by mouth daily with breakfast.     furosemide  (LASIX ) 40 MG tablet Take 1 tablet (40 mg total) by mouth daily. OFFICE VISIT NEEDED FOR ADDITIONAL REFILLS 90 tablet 0   gabapentin  (NEURONTIN ) 300 MG capsule Take 1 capsule (300 mg total) by mouth 3 (three) times daily. 180 capsule 1   glucose blood (RELION GLUCOSE TEST STRIPS) test strip Use as instructed 100 each 12   JANUVIA  100 MG tablet Take 1 tablet by mouth once daily 90 tablet 0   JARDIANCE  25 MG TABS tablet Take 1 tablet by mouth once daily 90 tablet 0   LANTUS  SOLOSTAR 100 UNIT/ML Solostar Pen INJECT 34 UNITS SUBCUTANEOUSLY ONCE DAILY 15 mL 0   ondansetron  (ZOFRAN ) 4 MG tablet Take 1 tablet (4 mg total) by mouth every 6 (six) hours as needed for nausea. 30 tablet 0   ondansetron  (ZOFRAN ) 4 MG tablet Take 1 tablet (4 mg total) by mouth every 6 (six) hours as needed for nausea. 20 tablet 0   ReliOn Ultra Thin Lancets MISC 1 each by Does not  apply route daily. 100 each 11   tirzepatide  (MOUNJARO ) 5 MG/0.5ML Pen INJECT ONE SYRINGEFUL (5MG ) INTO THE SKIN ONCE WEEKLY 12 mL 0   tiZANidine  (ZANAFLEX ) 4 MG tablet Take 1 tablet (4 mg total) by mouth every 8 (eight) hours as needed for muscle spasms. 90 tablet 1   ULTICARE MINI PEN NEEDLES 32G X 6 MM MISC 1 Bottle by Does not apply route as directed. 100 each 1   No current facility-administered medications for this visit.    Allergies[1]  Family History  Problem Relation Age of Onset   Diabetes Mother    Cancer Father    Mental illness Son     Social History   Socioeconomic History   Marital status: Divorced    Spouse name: Not on file   Number of children: 4   Years of education: Not on file   Highest education level: Not on file  Occupational History   Not on file  Tobacco Use   Smoking  status: Never   Smokeless tobacco: Never  Vaping Use   Vaping status: Never Used  Substance and Sexual Activity   Alcohol use: No    Alcohol/week: 0.0 standard drinks of alcohol   Drug use: No   Sexual activity: Not on file  Other Topics Concern   Not on file  Social History Narrative   Not on file   Social Drivers of Health   Tobacco Use: Low Risk (08/10/2024)   Patient History    Smoking Tobacco Use: Never    Smokeless Tobacco Use: Never    Passive Exposure: Not on file  Financial Resource Strain: Low Risk  (03/02/2024)   Received from [facility]   Overall Financial Resource Strain (CARDIA)    Difficulty of Paying Living Expenses: Not hard at all  Food Insecurity: No Food Insecurity (03/02/2024)   Received from [facility]   Epic    Within the past 12 months, you worried that your food would run out before you got the money to buy more.: Never true    Within the past 12 months, the food you bought just didn't last and you didn't have money to get more.: Never true  Transportation Needs: No Transportation Needs (03/02/2024)   Received from [facility] - Transportation    In the past 12 months, has lack of transportation kept you from medical appointments or from getting medications?: No    Lack of Transportation (Non-Medical): No  Physical Activity: Not on file  Stress: Not on file  Social Connections: Not on file  Intimate Partner Violence: Not on file  Depression (PHQ2-9): Low Risk (08/10/2024)   Depression (PHQ2-9)    PHQ-2 Score: 0  Alcohol Screen: Low Risk (10/08/2021)   Alcohol Screen    Last Alcohol Screening Score (AUDIT): 0  Housing: Unknown (03/02/2024)   Received from [facility]   Epic    In the last 12 months, was there a time when you were not able to pay the mortgage or rent on time?: No    In the past 12 months, how many times have you moved where you were living?: 0    Homeless  in the Last Year: Not on file  Utilities: Not At Risk (03/02/2024)   Received from [facility]   Epic    In the past 12 months has the electric, gas, oil, or water company threatened to shut off services in  your home?: No  Health Literacy: Not on file     Constitutional: Denies fever, malaise, fatigue, headache or abrupt weight changes.  HEENT: Denies eye pain, eye redness, ear pain, ringing in the ears, wax buildup, runny nose, nasal congestion, bloody nose, or sore throat. Respiratory: Denies difficulty breathing, shortness of breath, cough or sputum production.   Cardiovascular: Patient reports swelling in legs.  Denies chest pain, chest tightness, palpitations or swelling in the hands.  Gastrointestinal: Denies abdominal pain, bloating, constipation, diarrhea or blood in the stool.  GU: Denies urgency, frequency, pain with urination, burning sensation, blood in urine, odor or discharge. Musculoskeletal: Patient reports chronic joint and back pain.  Denies decrease in range of motion, difficulty with gait, muscle pain or joint swelling.  Skin: Denies redness, rashes, lesions or ulcercations.  Neurological: Patient reports peripheral neuropathy.  Denies dizziness, difficulty with memory, difficulty with speech or problems with balance and coordination.  Psych: Denies anxiety, depression, SI/HI.  No other specific complaints in a complete review of systems (except as listed in HPI above).      Objective:   Physical Exam  There were no vitals taken for this visit. Wt Readings from Last 3 Encounters:  08/10/24 215 lb (97.5 kg)  06/20/24 214 lb (97.1 kg)  05/24/24 214 lb (97.1 kg)    General: Appears their stated age, well developed, well nourished in NAD. Skin: Warm, dry and intact. No rashes, lesions or ulcerations noted. HEENT: Head: normal shape and size; Eyes: sclera white, no icterus, conjunctiva pink, PERRLA and EOMs intact; Ears: Tm's gray and intact, normal  light reflex; Nose: mucosa pink and moist, septum midline; Throat/Mouth: Teeth present, mucosa pink and moist, no exudate, lesions or ulcerations noted.  Neck:  Neck supple, trachea midline. No masses, lumps or thyromegaly present.  Cardiovascular: Normal rate and rhythm. S1,S2 noted.  No murmur, rubs or gallops noted. No JVD or BLE edema. No carotid bruits noted. Pulmonary/Chest: Normal effort and positive vesicular breath sounds. No respiratory distress. No wheezes, rales or ronchi noted.  Abdomen: Soft and nontender. Normal bowel sounds. No distention or masses noted. Liver, spleen and kidneys non palpable. Musculoskeletal: Normal range of motion. No signs of joint swelling. No difficulty with gait.  Neurological: Alert and oriented. Cranial nerves II-XII grossly intact. Coordination normal.  Psychiatric: Mood and affect normal. Behavior is normal. Judgment and thought content normal.    BMET    Component Value Date/Time   NA 136 03/15/2024 0517   NA 136 11/30/2015 1037   K 3.8 03/15/2024 0517   CL 108 03/15/2024 0517   CO2 20 (L) 03/15/2024 0517   GLUCOSE 128 (H) 03/15/2024 0517   BUN 18 03/15/2024 0517   BUN 19 11/30/2015 1037   CREATININE 0.71 03/15/2024 0517   CREATININE 0.67 11/27/2023 1457   CALCIUM  8.4 (L) 03/15/2024 0517   GFRNONAA >60 03/15/2024 0517   GFRNONAA 88 03/22/2019 0909   GFRAA 102 03/22/2019 0909    Lipid Panel     Component Value Date/Time   CHOL 207 (H) 11/27/2023 1457   CHOL 182 11/30/2015 1037   TRIG 158 (H) 11/27/2023 1457   HDL 55 11/27/2023 1457   HDL 47 11/30/2015 1037   CHOLHDL 3.8 11/27/2023 1457   VLDL 33 (H) 04/08/2017 0857   LDLCALC 125 (H) 11/27/2023 1457    CBC    Component Value Date/Time   WBC 13.5 (H) 03/15/2024 0517   RBC 4.11 03/15/2024 0517   HGB 11.1 (L) 03/15/2024 9482  HGB 12.2 11/30/2015 1037   HCT 34.6 (L) 03/15/2024 0517   HCT 38.0 11/30/2015 1037   PLT 289 03/15/2024 0517   PLT 428 (H) 11/30/2015 1037   MCV 84.2  03/15/2024 0517   MCV 83 11/30/2015 1037   MCH 27.0 03/15/2024 0517   MCHC 32.1 03/15/2024 0517   RDW 15.4 03/15/2024 0517   RDW 15.1 11/30/2015 1037   LYMPHSABS 3.5 03/01/2024 1038   LYMPHSABS 3.1 11/30/2015 1037   MONOABS 0.9 03/01/2024 1038   EOSABS 0.9 (H) 03/01/2024 1038   EOSABS 0.4 11/30/2015 1037   BASOSABS 0.1 03/01/2024 1038   BASOSABS 0.1 11/30/2015 1037    Hgb A1C Lab Results  Component Value Date   HGBA1C 6.4 (H) 11/27/2023            Assessment & Plan:   Preventative health maintenance:  Flu Tetanus UTD Encouraged her to get her COVID vaccine Pneumovax and Prevnar UTD Discussed Shingrix vaccine, she will check coverage with her insurance company and schedule visit if she would like to have this done She no longer needs to screen for cervical cancer Mammogram Bone density Colon screening Encouraged her to consume a balanced diet and exercise regimen Advised her to see an eye doctor and dentist annually We will check CBC, c-Met, lipid, A1c today  RTC in 6 months, follow-up chronic conditions Angeline Laura, NP     [1]  Allergies Allergen Reactions   Metformin  And Related Diarrhea and Nausea Only    Severe nausea and diarrhea even with extended release versions

## 2024-08-17 ENCOUNTER — Ambulatory Visit: Admitting: Internal Medicine

## 2024-08-17 ENCOUNTER — Encounter: Payer: Self-pay | Admitting: Internal Medicine

## 2024-08-17 VITALS — BP 150/90 | Ht 62.0 in | Wt 253.6 lb

## 2024-08-17 DIAGNOSIS — Z0001 Encounter for general adult medical examination with abnormal findings: Secondary | ICD-10-CM

## 2024-08-17 DIAGNOSIS — Z7985 Long-term (current) use of injectable non-insulin antidiabetic drugs: Secondary | ICD-10-CM | POA: Diagnosis not present

## 2024-08-17 DIAGNOSIS — R220 Localized swelling, mass and lump, head: Secondary | ICD-10-CM

## 2024-08-17 DIAGNOSIS — Z7984 Long term (current) use of oral hypoglycemic drugs: Secondary | ICD-10-CM

## 2024-08-17 DIAGNOSIS — Z23 Encounter for immunization: Secondary | ICD-10-CM

## 2024-08-17 DIAGNOSIS — E1142 Type 2 diabetes mellitus with diabetic polyneuropathy: Secondary | ICD-10-CM | POA: Diagnosis not present

## 2024-08-17 DIAGNOSIS — Z780 Asymptomatic menopausal state: Secondary | ICD-10-CM | POA: Diagnosis not present

## 2024-08-17 DIAGNOSIS — I152 Hypertension secondary to endocrine disorders: Secondary | ICD-10-CM

## 2024-08-17 DIAGNOSIS — Z794 Long term (current) use of insulin: Secondary | ICD-10-CM

## 2024-08-17 DIAGNOSIS — E1159 Type 2 diabetes mellitus with other circulatory complications: Secondary | ICD-10-CM | POA: Diagnosis not present

## 2024-08-17 DIAGNOSIS — I420 Dilated cardiomyopathy: Secondary | ICD-10-CM

## 2024-08-17 DIAGNOSIS — Z1231 Encounter for screening mammogram for malignant neoplasm of breast: Secondary | ICD-10-CM | POA: Diagnosis not present

## 2024-08-17 DIAGNOSIS — Z1211 Encounter for screening for malignant neoplasm of colon: Secondary | ICD-10-CM

## 2024-08-17 MED ORDER — TIRZEPATIDE 2.5 MG/0.5ML ~~LOC~~ SOAJ: 2.5000 mg | SUBCUTANEOUS | 0 refills | Status: DC

## 2024-08-17 MED ORDER — LANTUS SOLOSTAR 100 UNIT/ML ~~LOC~~ SOPN: 34.0000 [IU] | PEN_INJECTOR | Freq: Every day | SUBCUTANEOUS | 0 refills | Status: DC

## 2024-08-17 NOTE — Assessment & Plan Note (Signed)
 Uncontrolled Complicated by morbid obesity Advised her to continue amlodipine  10 mg daily, enalapril  20 mg twice daily and furosemide  20 mg daily Advised her to increase carvedilol  to 25 mg twice daily as prescribed Reinforced DASH diet and exercise for weight loss C-Met today

## 2024-08-17 NOTE — Assessment & Plan Note (Signed)
 Encouraged diet and exercise for weight loss ?

## 2024-08-17 NOTE — Assessment & Plan Note (Signed)
-

## 2024-08-17 NOTE — Addendum Note (Signed)
 Addended by: ANTONETTE ANGELINE ORN on: 08/17/2024 11:17 AM   Modules accepted: Orders

## 2024-08-17 NOTE — Patient Instructions (Signed)
 Health Maintenance for Postmenopausal Women Menopause is a normal process in which your ability to get pregnant comes to an end. This process happens slowly over many months or years, usually between the ages of 76 and 38. Menopause is complete when you have missed your menstrual period for 12 months. It is important to talk with your health care provider about some of the most common conditions that affect women after menopause (postmenopausal women). These include heart disease, cancer, and bone loss (osteoporosis). Adopting a healthy lifestyle and getting preventive care can help to promote your health and wellness. The actions you take can also lower your chances of developing some of these common conditions. What are the signs and symptoms of menopause? During menopause, you may have the following symptoms: Hot flashes. These can be moderate or severe. Night sweats. Decrease in sex drive. Mood swings. Headaches. Tiredness (fatigue). Irritability. Memory problems. Problems falling asleep or staying asleep. Talk with your health care provider about treatment options for your symptoms. Do I need hormone replacement therapy? Hormone replacement therapy is effective in treating symptoms that are caused by menopause, such as hot flashes and night sweats. Hormone replacement carries certain risks, especially as you become older. If you are thinking about using estrogen or estrogen with progestin, discuss the benefits and risks with your health care provider. How can I reduce my risk for heart disease and stroke? The risk of heart disease, heart attack, and stroke increases as you age. One of the causes may be a change in the body's hormones during menopause. This can affect how your body uses dietary fats, triglycerides, and cholesterol. Heart attack and stroke are medical emergencies. There are many things that you can do to help prevent heart disease and stroke. Watch your blood pressure High  blood pressure causes heart disease and increases the risk of stroke. This is more likely to develop in people who have high blood pressure readings or are overweight. Have your blood pressure checked: Every 3-5 years if you are 32-23 years of age. Every year if you are 31 years old or older. Eat a healthy diet  Eat a diet that includes plenty of vegetables, fruits, low-fat dairy products, and lean protein. Do not eat a lot of foods that are high in solid fats, added sugars, or sodium. Get regular exercise Get regular exercise. This is one of the most important things you can do for your health. Most adults should: Try to exercise for at least 150 minutes each week. The exercise should increase your heart rate and make you sweat (moderate-intensity exercise). Try to do strengthening exercises at least twice each week. Do these in addition to the moderate-intensity exercise. Spend less time sitting. Even light physical activity can be beneficial. Other tips Work with your health care provider to achieve or maintain a healthy weight. Do not use any products that contain nicotine or tobacco. These products include cigarettes, chewing tobacco, and vaping devices, such as e-cigarettes. If you need help quitting, ask your health care provider. Know your numbers. Ask your health care provider to check your cholesterol and your blood sugar (glucose). Continue to have your blood tested as directed by your health care provider. Do I need screening for cancer? Depending on your health history and family history, you may need to have cancer screenings at different stages of your life. This may include screening for: Breast cancer. Cervical cancer. Lung cancer. Colorectal cancer. What is my risk for osteoporosis? After menopause, you may be  at increased risk for osteoporosis. Osteoporosis is a condition in which bone destruction happens more quickly than new bone creation. To help prevent osteoporosis or  the bone fractures that can happen because of osteoporosis, you may take the following actions: If you are 24-54 years old, get at least 1,000 mg of calcium and at least 600 international units (IU) of vitamin D  per day. If you are older than age 75 but younger than age 30, get at least 1,200 mg of calcium and at least 600 international units (IU) of vitamin D  per day. If you are older than age 8, get at least 1,200 mg of calcium and at least 800 international units (IU) of vitamin D  per day. Smoking and drinking excessive alcohol increase the risk of osteoporosis. Eat foods that are rich in calcium and vitamin D , and do weight-bearing exercises several times each week as directed by your health care provider. How does menopause affect my mental health? Depression may occur at any age, but it is more common as you become older. Common symptoms of depression include: Feeling depressed. Changes in sleep patterns. Changes in appetite or eating patterns. Feeling an overall lack of motivation or enjoyment of activities that you previously enjoyed. Frequent crying spells. Talk with your health care provider if you think that you are experiencing any of these symptoms. General instructions See your health care provider for regular wellness exams and vaccines. This may include: Scheduling regular health, dental, and eye exams. Getting and maintaining your vaccines. These include: Influenza vaccine. Get this vaccine each year before the flu season begins. Pneumonia vaccine. Shingles vaccine. Tetanus, diphtheria, and pertussis (Tdap) booster vaccine. Your health care provider may also recommend other immunizations. Tell your health care provider if you have ever been abused or do not feel safe at home. Summary Menopause is a normal process in which your ability to get pregnant comes to an end. This condition causes hot flashes, night sweats, decreased interest in sex, mood swings, headaches, or lack  of sleep. Treatment for this condition may include hormone replacement therapy. Take actions to keep yourself healthy, including exercising regularly, eating a healthy diet, watching your weight, and checking your blood pressure and blood sugar levels. Get screened for cancer and depression. Make sure that you are up to date with all your vaccines. This information is not intended to replace advice given to you by your health care provider. Make sure you discuss any questions you have with your health care provider. Document Revised: 01/07/2021 Document Reviewed: 01/07/2021 Elsevier Patient Education  2024 [employer].

## 2024-08-18 ENCOUNTER — Ambulatory Visit: Payer: Self-pay | Admitting: Internal Medicine

## 2024-08-18 ENCOUNTER — Other Ambulatory Visit: Payer: Self-pay | Admitting: Internal Medicine

## 2024-08-18 DIAGNOSIS — E1142 Type 2 diabetes mellitus with diabetic polyneuropathy: Secondary | ICD-10-CM

## 2024-08-18 DIAGNOSIS — I10 Essential (primary) hypertension: Secondary | ICD-10-CM

## 2024-08-18 LAB — LIPID PANEL
Cholesterol: 218 mg/dL — ABNORMAL HIGH (ref <200)
HDL: 60 mg/dL (ref >=50)
LDL Cholesterol (Calc): 126 mg/dL — ABNORMAL HIGH
Non-HDL Cholesterol (Calc): 158 mg/dL — ABNORMAL HIGH (ref <130)
Total CHOL/HDL Ratio: 3.6 (calc) (ref <5.0)
Triglycerides: 199 mg/dL — ABNORMAL HIGH (ref <150)

## 2024-08-18 LAB — COMPREHENSIVE METABOLIC PANEL WITH GFR
AG Ratio: 1.4 (calc) (ref 1.0–2.5)
ALT: 13 U/L (ref 6–29)
AST: 14 U/L (ref 10–35)
Albumin: 4.3 g/dL (ref 3.6–5.1)
Alkaline phosphatase (APISO): 83 U/L (ref 37–153)
BUN: 18 mg/dL (ref 7–25)
CO2: 25 mmol/L (ref 20–32)
Calcium: 9.4 mg/dL (ref 8.6–10.4)
Chloride: 105 mmol/L (ref 98–110)
Creat: 0.8 mg/dL (ref 0.50–1.05)
Globulin: 3 g/dL (ref 1.9–3.7)
Glucose, Bld: 117 mg/dL — ABNORMAL HIGH (ref 65–99)
Potassium: 5 mmol/L (ref 3.5–5.3)
Sodium: 140 mmol/L (ref 135–146)
Total Bilirubin: 0.4 mg/dL (ref 0.2–1.2)
Total Protein: 7.3 g/dL (ref 6.1–8.1)
eGFR: 80 mL/min/1.73m2 (ref >=60)

## 2024-08-18 LAB — CBC
HCT: 38.5 % (ref 35.9–46.0)
Hemoglobin: 12.3 g/dL (ref 11.7–15.5)
MCH: 25.7 pg — ABNORMAL LOW (ref 27.0–33.0)
MCHC: 31.9 g/dL (ref 31.6–35.4)
MCV: 80.4 fL — ABNORMAL LOW (ref 81.4–101.7)
MPV: 11 fL (ref 7.5–12.5)
Platelets: 463 Thousand/uL — ABNORMAL HIGH (ref 140–400)
RBC: 4.79 Million/uL (ref 3.80–5.10)
RDW: 14.5 % (ref 11.0–15.0)
WBC: 11.3 Thousand/uL — ABNORMAL HIGH (ref 3.8–10.8)

## 2024-08-18 LAB — HEMOGLOBIN A1C
Hgb A1c MFr Bld: 7.2 % — ABNORMAL HIGH (ref <5.7)
Mean Plasma Glucose: 160 mg/dL
eAG (mmol/L): 8.9 mmol/L

## 2024-08-22 NOTE — Telephone Encounter (Signed)
 Requested Prescriptions  Pending Prescriptions Disp Refills   enalapril  (VASOTEC ) 20 MG tablet [Pharmacy Med Name: Enalapril  Maleate 20 MG Oral Tablet] 180 tablet 0    Sig: Take 1 tablet by mouth twice daily     Cardiovascular:  ACE Inhibitors Failed - 08/22/2024  3:59 PM      Failed - Last BP in normal range    BP Readings from Last 1 Encounters:  08/17/24 (!) 150/90         Passed - Cr in normal range and within 180 days    Creat  Date Value Ref Range Status  08/17/2024 0.80 0.50 - 1.05 mg/dL Final   Creatinine, Urine  Date Value Ref Range Status  11/27/2023 85 20 - 275 mg/dL Final         Passed - K in normal range and within 180 days    Potassium  Date Value Ref Range Status  08/17/2024 5.0 3.5 - 5.3 mmol/L Final         Passed - Patient is not pregnant      Passed - Valid encounter within last 6 months    Recent Outpatient Visits           5 days ago Encounter for general adult medical examination with abnormal findings   [place] [facility] [place], Angeline ORN, NP   5 months ago Bilateral primary osteoarthritis of knee   [place] [facility] [place], Angeline ORN, NP   8 months ago Urinary urgency   [place] [facility] [place], [place], NP   8 months ago DM type 2 with diabetic peripheral neuropathy [facility])   [place] [facility] [place], Angeline ORN, [place]

## 2024-08-30 ENCOUNTER — Telehealth: Payer: Self-pay | Admitting: Internal Medicine

## 2024-08-30 MED ORDER — FREESTYLE LIBRE 3 PLUS SENSOR MISC: 3 refills | Status: AC

## 2024-08-30 NOTE — Telephone Encounter (Signed)
 Rx for freestyle libre 3 sent to pharmacy

## 2024-08-30 NOTE — Addendum Note (Signed)
 Addended by: ANTONETTE ANGELINE ORN on: 08/30/2024 02:50 PM   Modules accepted: Orders

## 2024-08-30 NOTE — Telephone Encounter (Signed)
 Not on current med list.

## 2024-08-30 NOTE — Telephone Encounter (Signed)
 Copied from CRM #[id number]. Topic: Clinical - Medication Refill >> Aug 30, 2024  1:28 PM Stacy Martinez wrote: Medication: Libre 3 freestyle discs  Has the patient contacted their pharmacy? Yes (Agent: If no, request that the patient contact the pharmacy for the refill. If patient does not wish to contact the pharmacy document the reason why and proceed with request.) (Agent: If yes, when and what did the pharmacy advise?)  This is the patient's preferred pharmacy:  Says these came in the mail and that she never received them from the pharmacy...   Is this the correct pharmacy for this prescription? Yes If no, delete pharmacy and type the correct one.   Has the prescription been filled recently? Yes  Is the patient out of the medication? Yes  Has the patient been seen for an appointment in the last year OR does the patient have an upcoming appointment? Yes  Can we respond through MyChart? Yes  Agent: Please be advised that Rx refills may take up to 3 business days. We ask that you follow-up with your pharmacy.

## 2024-08-30 NOTE — Telephone Encounter (Signed)
"  Patient advised  "

## 2024-09-05 ENCOUNTER — Ambulatory Visit: Admitting: Student in an Organized Health Care Education/Training Program

## 2024-09-06 ENCOUNTER — Ambulatory Visit: Admitting: Internal Medicine

## 2024-09-06 NOTE — Progress Notes (Deleted)
 "  Subjective:    Patient ID: Stacy Martinez, female    DOB: 08/31/1956, 69 y.o.   MRN: [id number]  HPI    Review of Systems   Past Medical History:  Diagnosis Date   Abdominal hernia s/p mesh repair with recurrence and second repair    Allergy    Anemia    Aortic atherosclerosis    Arthritis    DDD (degenerative disc disease), lumbar    Enlarged heart    Hepatic steatosis    HFrEF (heart failure with reduced ejection fraction) (HCC)    HLD (hyperlipidemia)    Hypertension    LBBB (left bundle branch block)    Peripartum cardiomyopathy    T2DM (type 2 diabetes mellitus) (HCC)     Current Outpatient Medications  Medication Sig Dispense Refill   acetaminophen  (TYLENOL ) 500 MG tablet Take 2 tablets (1,000 mg total) by mouth every 8 (eight) hours. 30 tablet 0   amLODipine  (NORVASC ) 10 MG tablet Take 1 tablet by mouth once daily 90 tablet 0   aspirin  EC 81 MG tablet Take 81 mg by mouth daily. Swallow whole. (Patient not taking: Reported on 08/17/2024)     bisacodyl  (DULCOLAX) 5 MG EC tablet Take 5 mg by mouth daily as needed for moderate constipation.     carvedilol  (COREG ) 25 MG tablet TAKE 1 TABLET BY MOUTH TWICE DAILY WITH MEALS APPOINTMENT  REQUIRED  FOR  FUTURE  REFILLS 180 tablet 0   Continuous Glucose Sensor (FREESTYLE LIBRE 3 PLUS SENSOR) MISC Change sensor every 15 days. 6 each 3   diclofenac  (VOLTAREN ) 75 MG EC tablet Take 1 tablet (75 mg total) by mouth every 12 (twelve) hours as needed for moderate pain (pain score 4-6). 60 tablet 1   docusate sodium  (COLACE) 100 MG capsule Take 1 capsule (100 mg total) by mouth 2 (two) times daily. 10 capsule 0   enalapril  (VASOTEC ) 20 MG tablet Take 1 tablet by mouth twice daily 180 tablet 0   fenofibrate  54 MG tablet Take 1 tablet by mouth once daily 90 tablet 0   ferrous sulfate 325 (65 FE) MG tablet Take 325 mg by mouth daily with breakfast.     furosemide  (LASIX ) 40 MG tablet Take 1 tablet (40 mg total) by mouth daily. OFFICE VISIT  NEEDED FOR ADDITIONAL REFILLS 90 tablet 0   gabapentin  (NEURONTIN ) 300 MG capsule Take 1 capsule (300 mg total) by mouth 3 (three) times daily. 180 capsule 1   glucose blood (RELION GLUCOSE TEST STRIPS) test strip Use as instructed 100 each 12   insulin  glargine (LANTUS  SOLOSTAR) 100 UNIT/ML Solostar Pen Inject 34 Units into the skin daily. 15 mL 0   JANUVIA  100 MG tablet Take 1 tablet by mouth once daily 90 tablet 0   JARDIANCE  25 MG TABS tablet Take 1 tablet by mouth once daily 90 tablet 0   ondansetron  (ZOFRAN ) 4 MG tablet Take 1 tablet (4 mg total) by mouth every 6 (six) hours as needed for nausea. 30 tablet 0   ondansetron  (ZOFRAN ) 4 MG tablet Take 1 tablet (4 mg total) by mouth every 6 (six) hours as needed for nausea. 20 tablet 0   ReliOn Ultra Thin Lancets MISC 1 each by Does not apply route daily. 100 each 11   tirzepatide  (MOUNJARO ) 2.5 MG/0.5ML Pen Inject 2.5 mg into the skin once a week. 2 mL 0   tiZANidine  (ZANAFLEX ) 4 MG tablet Take 1 tablet (4 mg total) by mouth every 8 (eight)  hours as needed for muscle spasms. 90 tablet 1   ULTICARE MINI PEN NEEDLES 32G X 6 MM MISC 1 Bottle by Does not apply route as directed. 100 each 1   No current facility-administered medications for this visit.    Allergies[1]  Family History  Problem Relation Age of Onset   Diabetes Mother    Cancer Father    Mental illness Son     Social History   Socioeconomic History   Marital status: Divorced    Spouse name: Not on file   Number of children: 4   Years of education: Not on file   Highest education level: Not on file  Occupational History   Not on file  Tobacco Use   Smoking status: Never   Smokeless tobacco: Never  Vaping Use   Vaping status: Never Used  Substance and Sexual Activity   Alcohol use: No    Alcohol/week: 0.0 standard drinks of alcohol   Drug use: No   Sexual activity: Not on file  Other Topics Concern   Not on file  Social History Narrative   Not on file    Social Drivers of Health   Tobacco Use: Low Risk (08/17/2024)   Patient History    Smoking Tobacco Use: Never    Smokeless Tobacco Use: Never    Passive Exposure: Not on file  Financial Resource Strain: Low Risk  (03/02/2024)   Received from [facility]   Overall Financial Resource Strain (CARDIA)    Difficulty of Paying Living Expenses: Not hard at all  Food Insecurity: No Food Insecurity (03/02/2024)   Received from [facility]   Epic    Within the past 12 months, you worried that your food would run out before you got the money to buy more.: Never true    Within the past 12 months, the food you bought just didn't last and you didn't have money to get more.: Never true  Transportation Needs: No Transportation Needs (03/02/2024)   Received from [facility] - Transportation    In the past 12 months, has lack of transportation kept you from medical appointments or from getting medications?: No    Lack of Transportation (Non-Medical): No  Physical Activity: Not on file  Stress: Not on file  Social Connections: Not on file  Intimate Partner Violence: Not on file  Depression (PHQ2-9): Low Risk (08/17/2024)   Depression (PHQ2-9)    PHQ-2 Score: 0  Alcohol Screen: Low Risk (10/08/2021)   Alcohol Screen    Last Alcohol Screening Score (AUDIT): 0  Housing: Unknown (03/02/2024)   Received from [facility]   Epic    In the last 12 months, was there a time when you were not able to pay the mortgage or rent on time?: No    In the past 12 months, how many times have you moved where you were living?: 0    Homeless in the Last Year: Not on file  Utilities: Not At Risk (03/02/2024)   Received from [facility]   Epic    In the past 12 months has the electric, gas, oil, or water company threatened to shut off services in your home?: No  Health Literacy: Not on file     Constitutional: Denies fever,  malaise, fatigue, headache or abrupt weight changes.  HEENT: Denies eye pain, eye redness, ear pain, ringing in the ears, wax buildup, runny nose, nasal congestion, bloody  nose, or sore throat. Respiratory: Denies difficulty breathing, shortness of breath, cough or sputum production.   Cardiovascular: Patient reports swelling in legs.  Denies chest pain, chest tightness, palpitations or swelling in the hands.  Gastrointestinal: Denies abdominal pain, bloating, constipation, diarrhea or blood in the stool.  GU: Denies urgency, frequency, pain with urination, burning sensation, blood in urine, odor or discharge. Musculoskeletal: Patient reports chronic joint and back pain.  Denies decrease in range of motion, difficulty with gait, muscle pain or joint swelling.  Skin:  Denies redness, rashes, lesions or ulcercations.  Neurological: Patient reports peripheral neuropathy.  Denies dizziness, difficulty with memory, difficulty with speech or problems with balance and coordination.  Psych: Denies anxiety, depression, SI/HI.  No other specific complaints in a complete review of systems (except as listed in HPI above).      Objective:   Physical Exam There were no vitals taken for this visit.  Wt Readings from Last 3 Encounters:  08/17/24 253 lb 9.6 oz (115 kg)  08/10/24 215 lb (97.5 kg)  06/20/24 214 lb (97.1 kg)    General: Appears her stated age, Stacy Martinez, in NAD. Skin: Warm, dry and intact.  1 cm oval-shaped smooth, nonmobile mass noted of the left nasal bridge.  No rashes, lesions or ulcerations noted. HEENT: Head: normal shape and size; Eyes: sclera white, no icterus, conjunctiva pink, PERRLA and EOMs intact;  Neck:  Neck supple, trachea midline. No masses, lumps or thyromegaly present.  Cardiovascular: Normal rate and rhythm. S1,S2 noted.  No murmur, rubs or gallops noted. No JVD or BLE edema. No carotid bruits noted. Pulmonary/Chest: Normal effort and positive vesicular breath sounds. No  respiratory distress. No wheezes, rales or ronchi noted.  Abdomen: Normal bowel sounds.  Musculoskeletal: Strength 5/5 BUE/BLE.  No difficulty with gait.  Neurological: Alert and oriented. Cranial nerves II-XII grossly intact. Coordination normal.  Psychiatric: Mood and affect normal. Behavior is normal. Judgment and thought content normal.    BMET    Component Value Date/Time   NA 140 08/17/2024 1057   NA 136 11/30/2015 1037   K 5.0 08/17/2024 1057   CL 105 08/17/2024 1057   CO2 25 08/17/2024 1057   GLUCOSE 117 (H) 08/17/2024 1057   BUN 18 08/17/2024 1057   BUN 19 11/30/2015 1037   CREATININE 0.80 08/17/2024 1057   CALCIUM  9.4 08/17/2024 1057   GFRNONAA >60 03/15/2024 0517   GFRNONAA 88 03/22/2019 0909   GFRAA 102 03/22/2019 0909    Lipid Panel     Component Value Date/Time   CHOL 218 (H) 08/17/2024 1057   CHOL 182 11/30/2015 1037   TRIG 199 (H) 08/17/2024 1057   HDL 60 08/17/2024 1057   HDL 47 11/30/2015 1037   CHOLHDL 3.6 08/17/2024 1057   VLDL 33 (H) 04/08/2017 0857   LDLCALC 126 (H) 08/17/2024 1057    CBC    Component Value Date/Time   WBC 11.3 (H) 08/17/2024 1057   RBC 4.79 08/17/2024 1057   HGB 12.3 08/17/2024 1057   HGB 12.2 11/30/2015 1037   HCT 38.5 08/17/2024 1057   HCT 38.0 11/30/2015 1037   PLT 463 (H) 08/17/2024 1057   PLT 428 (H) 11/30/2015 1037   MCV 80.4 (L) 08/17/2024 1057   MCV 83 11/30/2015 1037   MCH 25.7 (L) 08/17/2024 1057   MCHC 31.9 08/17/2024 1057   RDW 14.5 08/17/2024 1057   RDW 15.1 11/30/2015 1037   LYMPHSABS 3.5 03/01/2024 1038   LYMPHSABS 3.1 11/30/2015 1037  MONOABS 0.9 03/01/2024 1038   EOSABS 0.9 (H) 03/01/2024 1038   EOSABS 0.4 11/30/2015 1037   BASOSABS 0.1 03/01/2024 1038   BASOSABS 0.1 11/30/2015 1037    Hgb A1C Lab Results  Component Value Date   HGBA1C 7.2 (H) 08/17/2024            Assessment & Plan:    RTC in 5 months, follow-up chronic conditions Stacy Laura, NP       [1]   Allergies Allergen Reactions   Metformin  And Related Diarrhea and Nausea Only    Severe nausea and diarrhea even with extended release versions   "

## 2024-09-08 ENCOUNTER — Other Ambulatory Visit: Payer: Self-pay | Admitting: Internal Medicine

## 2024-09-09 NOTE — Telephone Encounter (Signed)
 Requested medication (s) are due for refill today: Yes  Requested medication (s) are on the active medication list: Yes  Last refill:  08/17/24  Future visit scheduled: Yes  Notes to clinic:  Manual review.    Requested Prescriptions  Pending Prescriptions Disp Refills   MOUNJARO  2.5 MG/0.5ML Pen [Pharmacy Med Name: Mounjaro  2.5 MG/0.5ML Subcutaneous Solution Auto-injector] 4 mL 0    Sig: INJECT 1 PEN (2.5MG ) SUBCUTANEOUSLY ONCE A WEEK     Off-Protocol Failed - 09/09/2024 11:23 AM      Failed - Medication not assigned to a protocol, review manually.      Passed - Valid encounter within last 12 months    Recent Outpatient Visits           3 weeks ago Encounter for general adult medical examination with abnormal findings   [place] [facility] [place], Angeline ORN, NP   6 months ago Bilateral primary osteoarthritis of knee   [place] [facility] [place], Angeline ORN, NP   9 months ago Urinary urgency   [place] [facility] [place], Angeline ORN, NP   9 months ago DM type 2 with diabetic peripheral neuropathy [facility])   [place] [facility] [place], Angeline ORN, [place]

## 2024-09-12 ENCOUNTER — Encounter: Payer: Self-pay | Admitting: Internal Medicine

## 2024-09-22 ENCOUNTER — Other Ambulatory Visit: Payer: Self-pay | Admitting: Internal Medicine

## 2024-09-22 DIAGNOSIS — E1142 Type 2 diabetes mellitus with diabetic polyneuropathy: Secondary | ICD-10-CM

## 2024-09-22 DIAGNOSIS — I10 Essential (primary) hypertension: Secondary | ICD-10-CM

## 2024-09-22 NOTE — Telephone Encounter (Signed)
 Requested Prescriptions  Pending Prescriptions Disp Refills   carvedilol  (COREG ) 25 MG tablet [Pharmacy Med Name: Carvedilol  25 MG Oral Tablet] 180 tablet 0    Sig: TAKE 1 TABLET BY MOUTH TWICE DAILY WITH MEALS (APPOINTMENT  REQUIRED  FOR  FUTURE  REFILLS)     Cardiovascular: Beta Blockers 3 Failed - 09/22/2024 12:51 PM      Failed - Last BP in normal range    BP Readings from Last 1 Encounters:  08/17/24 (!) 150/90         Passed - Cr in normal range and within 360 days    Creat  Date Value Ref Range Status  08/17/2024 0.80 0.50 - 1.05 mg/dL Final   Creatinine, Urine  Date Value Ref Range Status  11/27/2023 85 20 - 275 mg/dL Final         Passed - AST in normal range and within 360 days    AST  Date Value Ref Range Status  08/17/2024 14 10 - 35 U/L Final         Passed - ALT in normal range and within 360 days    ALT  Date Value Ref Range Status  08/17/2024 13 6 - 29 U/L Final         Passed - Last Heart Rate in normal range    Pulse Readings from Last 1 Encounters:  08/10/24 78         Passed - Valid encounter within last 6 months    Recent Outpatient Visits           1 month ago Encounter for general adult medical examination with abnormal findings   [place] [facility] [place], Angeline ORN, NP   6 months ago Bilateral primary osteoarthritis of knee   [place] [facility] [place], Angeline ORN, NP   9 months ago Urinary urgency   [place] [facility] [place], Angeline ORN, NP   10 months ago DM type 2 with diabetic peripheral neuropathy [facility])   [facility] [place], Angeline ORN, NP               JANUVIA  100 MG tablet [Pharmacy Med Name: Januvia  100 MG Oral Tablet] 90 tablet 0    Sig: Take 1 tablet by mouth once daily     Endocrinology:  Diabetes - DPP-4 Inhibitors Passed - 09/22/2024 12:51 PM      Passed - HBA1C is between 0 and 7.9 and within 180 days    Hemoglobin A1C  Date Value Ref  Range Status  01/06/2017 8.0  Final   Hgb A1c MFr Bld  Date Value Ref Range Status  08/17/2024 7.2 (H) <5.7 % Final    Comment:    For someone without known diabetes, a hemoglobin A1c value of 6.5% or greater indicates that they may have  diabetes and this should be confirmed with a follow-up  test. . For someone with known diabetes, a value <7% indicates  that their diabetes is well controlled and a value  greater than or equal to 7% indicates suboptimal  control. A1c targets should be individualized based on  duration of diabetes, age, comorbid conditions, and  other considerations. . Currently, no consensus exists regarding use of hemoglobin A1c for diagnosis of diabetes for children. .          Passed - Cr in normal range and within 360 days    Creat  Date Value Ref Range  Status  08/17/2024 0.80 0.50 - 1.05 mg/dL Final   Creatinine, Urine  Date Value Ref Range Status  11/27/2023 85 20 - 275 mg/dL Final         Passed - Valid encounter within last 6 months    Recent Outpatient Visits           1 month ago Encounter for general adult medical examination with abnormal findings   [place] [place], Angeline ORN, NP   6 months ago Bilateral primary osteoarthritis of knee   [place] [facility] [place], Angeline ORN, NP   9 months ago Urinary urgency   [place] [facility] [place], Angeline ORN, NP   10 months ago DM type 2 with diabetic peripheral neuropathy [facility])    [facility] [place], Angeline ORN, NP               fenofibrate  54 MG tablet [Pharmacy Med Name: Fenofibrate  54 MG Oral Tablet] 90 tablet 0    Sig: Take 1 tablet by mouth once daily     Cardiovascular:  Antilipid - Fibric Acid Derivatives Failed - 09/22/2024 12:51 PM      Failed - PLT in normal range and within 360 days    Platelets  Date Value Ref Range Status  08/17/2024 463 (H) 140 - 400 Thousand/uL Final  11/30/2015 428  (H) 150 - 379 x10E3/uL Final         Failed - WBC in normal range and within 360 days    WBC  Date Value Ref Range Status  08/17/2024 11.3 (H) 3.8 - 10.8 Thousand/uL Final         Failed - Lipid Panel in normal range within the last 12 months    Cholesterol, Total  Date Value Ref Range Status  11/30/2015 182 100 - 199 mg/dL Final   Cholesterol  Date Value Ref Range Status  08/17/2024 218 (H) <200 mg/dL Final   LDL Cholesterol (Calc)  Date Value Ref Range Status  08/17/2024 126 (H) mg/dL (calc) Final    Comment:    Reference range: <100 . Desirable range <100 mg/dL for primary prevention;   <70 mg/dL for patients with CHD or diabetic patients  with > or = 2 CHD risk factors. SABRA LDL-C is now calculated using the Martin-Hopkins  calculation, which is a validated novel method providing  better accuracy than the Friedewald equation in the  estimation of LDL-C.  Gladis APPLETHWAITE et al. SANDREA. 7986;689(80): 2061-2068  (http://education.[employer].com/faq/FAQ164)    HDL  Date Value Ref Range Status  08/17/2024 60 > OR = 50 mg/dL Final  96/68/7982 47 >60 mg/dL Final   Triglycerides  Date Value Ref Range Status  08/17/2024 199 (H) <150 mg/dL Final         Passed - ALT in normal range and within 360 days    ALT  Date Value Ref Range Status  08/17/2024 13 6 - 29 U/L Final         Passed - AST in normal range and within 360 days    AST  Date Value Ref Range Status  08/17/2024 14 10 - 35 U/L Final         Passed - Cr in normal range and within 360 days    Creat  Date Value Ref Range Status  08/17/2024 0.80 0.50 - 1.05 mg/dL Final   Creatinine, Urine  Date Value Ref Range Status  11/27/2023 85 20 -  275 mg/dL Final         Passed - HGB in normal range and within 360 days    Hemoglobin  Date Value Ref Range Status  08/17/2024 12.3 11.7 - 15.5 g/dL Final  96/68/7982 87.7 11.1 - 15.9 g/dL Final         Passed - HCT in normal range and within 360 days    HCT   Date Value Ref Range Status  08/17/2024 38.5 35.9 - 46.0 % Final   Hematocrit  Date Value Ref Range Status  11/30/2015 38.0 34.0 - 46.6 % Final         Passed - eGFR is 30 or above and within 360 days    GFR, Est African American  Date Value Ref Range Status  03/22/2019 102 > OR = 60 mL/min/1.28m2 Final   GFR, Est Non African American  Date Value Ref Range Status  03/22/2019 88 > OR = 60 mL/min/1.28m2 Final   GFR, Estimated  Date Value Ref Range Status  03/15/2024 >60 >60 mL/min Final    Comment:    (NOTE) Calculated using the CKD-EPI Creatinine Equation (2021)    eGFR  Date Value Ref Range Status  08/17/2024 80 > OR = 60 mL/min/1.67m2 Final         Passed - Valid encounter within last 12 months    Recent Outpatient Visits           1 month ago Encounter for general adult medical examination with abnormal findings   [place] [facility] [place], Angeline ORN, NP   6 months ago Bilateral primary osteoarthritis of knee   [place] [facility] [place], Angeline ORN, NP   9 months ago Urinary urgency   [place] [facility] [place], Angeline ORN, NP   10 months ago DM type 2 with diabetic peripheral neuropathy [facility])   [place] [facility] [place], Angeline ORN, NP               JARDIANCE  25 MG TABS tablet [Pharmacy Med Name: Jardiance  25 MG Oral Tablet] 90 tablet 0    Sig: Take 1 tablet by mouth once daily     Endocrinology:  Diabetes - SGLT2 Inhibitors Passed - 09/22/2024 12:51 PM      Passed - Cr in normal range and within 360 days    Creat  Date Value Ref Range Status  08/17/2024 0.80 0.50 - 1.05 mg/dL Final   Creatinine, Urine  Date Value Ref Range Status  11/27/2023 85 20 - 275 mg/dL Final         Passed - HBA1C is between 0 and 7.9 and within 180 days    Hemoglobin A1C  Date Value Ref Range Status  01/06/2017 8.0  Final   Hgb A1c MFr Bld  Date Value Ref Range Status  08/17/2024 7.2 (H) <5.7 %  Final    Comment:    For someone without known diabetes, a hemoglobin A1c value of 6.5% or greater indicates that they may have  diabetes and this should be confirmed with a follow-up  test. . For someone with known diabetes, a value <7% indicates  that their diabetes is well controlled and a value  greater than or equal to 7% indicates suboptimal  control. A1c targets should be individualized based on  duration of diabetes, age, comorbid conditions, and  other considerations. . Currently, no consensus exists regarding use of hemoglobin A1c for diagnosis of diabetes for  children. .          Passed - eGFR in normal range and within 360 days    GFR, Est African American  Date Value Ref Range Status  03/22/2019 102 > OR = 60 mL/min/1.36m2 Final   GFR, Est Non African American  Date Value Ref Range Status  03/22/2019 88 > OR = 60 mL/min/1.68m2 Final   GFR, Estimated  Date Value Ref Range Status  03/15/2024 >60 >60 mL/min Final    Comment:    (NOTE) Calculated using the CKD-EPI Creatinine Equation (2021)    eGFR  Date Value Ref Range Status  08/17/2024 80 > OR = 60 mL/min/1.16m2 Final         Passed - Valid encounter within last 6 months    Recent Outpatient Visits           1 month ago Encounter for general adult medical examination with abnormal findings   [place] [facility] [place], Angeline ORN, NP   6 months ago Bilateral primary osteoarthritis of knee   [place] [facility] [place], Angeline ORN, NP   9 months ago Urinary urgency   [place] [facility] [place], [place] W, NP   10 months ago DM type 2 with diabetic peripheral neuropathy [facility])    [facility] [place], Angeline ORN, [place]

## 2024-09-23 ENCOUNTER — Other Ambulatory Visit: Payer: Self-pay | Admitting: Internal Medicine

## 2024-09-23 NOTE — Telephone Encounter (Signed)
 Requested medication (s) are due for refill today: yes  Requested medication (s) are on the active medication list: yes  Last refill:  08/17/24 15 ml   Future visit scheduled: yes  Notes to clinic:  med not assigned to a protocol   Requested Prescriptions  Pending Prescriptions Disp Refills   insulin  glargine-yfgn (SEMGLEE ) 100 UNIT/ML Pen [Pharmacy Med Name: Insulin  Glargine-yfgn 100 UNIT/ML Subcutaneous Solution Pen-injector] 15 mL 0    Sig: INJECT 34 UNITS SUBCUTANEOUSLY ONCE DAILY     Off-Protocol Failed - 09/23/2024  1:59 PM      Failed - Medication not assigned to a protocol, review manually.      Passed - Valid encounter within last 12 months    Recent Outpatient Visits           1 month ago Encounter for general adult medical examination with abnormal findings   [place] [facility] [place], Angeline ORN, NP   6 months ago Bilateral primary osteoarthritis of knee   [place] [facility] [place], Angeline ORN, NP   9 months ago Urinary urgency   [place] [facility] [place], Angeline ORN, NP   10 months ago DM type 2 with diabetic peripheral neuropathy [facility])   [place] [facility] [place], Angeline ORN, [place]

## 2024-09-27 ENCOUNTER — Other Ambulatory Visit: Payer: Self-pay

## 2024-10-04 ENCOUNTER — Other Ambulatory Visit: Payer: Self-pay | Admitting: Internal Medicine

## 2024-10-05 ENCOUNTER — Ambulatory Visit
Admission: RE | Admit: 2024-10-05 | Discharge: 2024-10-05 | Disposition: A | Source: Ambulatory Visit | Attending: Student in an Organized Health Care Education/Training Program | Admitting: Student in an Organized Health Care Education/Training Program

## 2024-10-05 ENCOUNTER — Encounter: Payer: Self-pay | Admitting: Student in an Organized Health Care Education/Training Program

## 2024-10-05 ENCOUNTER — Ambulatory Visit: Payer: Self-pay | Admitting: Student in an Organized Health Care Education/Training Program

## 2024-10-05 VITALS — BP 111/75 | HR 70 | Temp 98.3°F | Resp 18 | Ht 61.0 in | Wt 215.0 lb

## 2024-10-05 DIAGNOSIS — M47816 Spondylosis without myelopathy or radiculopathy, lumbar region: Secondary | ICD-10-CM | POA: Diagnosis not present

## 2024-10-05 MED ORDER — FENTANYL CITRATE (PF) 100 MCG/2ML IJ SOLN
25.0000 ug | INTRAMUSCULAR | Status: DC | PRN
  Administered 2024-10-05: 50 ug via INTRAVENOUS

## 2024-10-05 MED ORDER — LIDOCAINE HCL 2 % IJ SOLN
INTRAMUSCULAR | Status: AC
  Filled 2024-10-05: qty 20

## 2024-10-05 MED ORDER — ROPIVACAINE HCL 2 MG/ML IJ SOLN
18.0000 mL | Freq: Once | INTRAMUSCULAR | Status: AC
  Administered 2024-10-05: 18 mL via PERINEURAL

## 2024-10-05 MED ORDER — LACTATED RINGERS IV SOLN: Freq: Once | INTRAVENOUS | Status: AC

## 2024-10-05 MED ORDER — DEXAMETHASONE SOD PHOSPHATE PF 10 MG/ML IJ SOLN
20.0000 mg | Freq: Once | INTRAMUSCULAR | Status: AC
  Administered 2024-10-05: 20 mg

## 2024-10-05 MED ORDER — DEXAMETHASONE SOD PHOSPHATE PF 10 MG/ML IJ SOLN
INTRAMUSCULAR | Status: AC
  Filled 2024-10-05: qty 2

## 2024-10-05 MED ORDER — MIDAZOLAM HCL 2 MG/2ML IJ SOLN
INTRAMUSCULAR | Status: AC
  Filled 2024-10-05: qty 2

## 2024-10-05 MED ORDER — LIDOCAINE HCL 2 % IJ SOLN
20.0000 mL | Freq: Once | INTRAMUSCULAR | Status: AC
  Administered 2024-10-05: 400 mg

## 2024-10-05 MED ORDER — ROPIVACAINE HCL 2 MG/ML IJ SOLN
INTRAMUSCULAR | Status: AC
  Filled 2024-10-05: qty 20

## 2024-10-05 MED ORDER — MIDAZOLAM HCL (PF) 2 MG/2ML IJ SOLN
0.5000 mg | Freq: Once | INTRAMUSCULAR | Status: AC
  Administered 2024-10-05: 2 mg via INTRAVENOUS

## 2024-10-05 MED ORDER — FENTANYL CITRATE (PF) 100 MCG/2ML IJ SOLN
INTRAMUSCULAR | Status: AC
  Filled 2024-10-05: qty 2

## 2024-10-05 NOTE — Progress Notes (Signed)
 PROVIDER NOTE: Information contained herein reflects review and annotations entered in association with encounter. Interpretation of such information and data should be left to medically-trained personnel. Information provided to patient can be located elsewhere in the medical record under Patient Instructions. Document created using STT-dictation technology, any transcriptional errors that may result from process are unintentional.    Patient: Stacy Martinez  Service Category: Procedure  Provider: Wallie Sherry, MD  DOB: 1956-04-05  DOS: 10/05/2024  Location: [facility]  MRN: [id number]  Setting: Ambulatory - outpatient  Referring Provider: Antonette Angeline ORN, NP  Type: Established Patient  Specialty: Interventional Pain Management  PCP: Antonette Angeline ORN, NP   Primary Reason for Visit: Interventional Pain Management Treatment. CC: Back Pain (Lower right and left)   Procedure:          Anesthesia, Analgesia, Anxiolysis:  Type: Thermal Lumbar Facet, Medial Branch Radiofrequency Ablation/Neurotomy           Primary Purpose: Therapeutic Region: Posterolateral Lumbosacral Spine Level: L3, L4, L5, Medial Branch Level(s). These levels will denervate the L3-4, L4-5, lumbar facet joints. Laterality: Bilateral  Type:  IV Versed  and Fentanyl  Indication(s): Anxiety and Analgesia Route: Intravenous (IV) IV Access: Secured Sedation: Meaningful verbal contact was maintained at all times during the procedure  Local Anesthetic: Lidocaine  1-2%  Position: Prone   Indications: 1. Facet syndrome, lumbar   2. Lumbar spondylosis     [name] has been dealing with the above chronic pain for longer than three months and has either failed to respond, was unable to tolerate, or simply did not get enough benefit from other more conservative therapies including, but not limited to: 1. Over-the-counter medications 2. Anti-inflammatory medications 3. Muscle relaxants 4. Membrane stabilizers 5.  Opioids 6. Physical therapy and/or chiropractic manipulation 7. Modalities (Heat, ice, etc.) 8. Invasive techniques such as nerve blocks. [name] has attained more than 50% relief of the pain from a series of diagnostic injections conducted in separate occasions.  Pain Score: Pre-procedure: 6 /10 Post-procedure: 0-No pain/10  Pre-op H&P Assessment:  Stacy Martinez is a 69 y.o. (year old), female patient, seen today for interventional treatment. She  has a past surgical history that includes Wrist fracture surgery (Right, 2013); Cesarean section (N/A, 1999); Reverse shoulder arthroplasty (Right, 08/27/2020); Total hip arthroplasty (Right, 11/29/2021); Abdominal hernia repair (2001); RECURRENT ABDOMINAL HERNIA REPAIR (2006); Cesarean section (N/A, 2001); and Total knee arthroplasty (Left, 03/14/2024). Stacy Martinez has a current medication list which includes the following prescription(s): acetaminophen , amlodipine , aspirin  ec, bisacodyl , carvedilol , freestyle libre 3 plus sensor, diclofenac , docusate sodium , enalapril , fenofibrate , ferrous sulfate, furosemide , gabapentin , relion glucose test strips, insulin  glargine-yfgn, januvia , jardiance , mounjaro , ondansetron , ondansetron , relion ultra thin lancets, tizanidine , and ulticare mini pen needles, and the following Facility-Administered Medications: fentanyl . Her primarily concern today is the Back Pain (Lower right and left)   Initial Vital Signs:  Pulse/HCG Rate: 70ECG Heart Rate: 71 Temp: 98.3 F (36.8 C) Resp: 15 BP: 123/63 SpO2: 99 %  BMI: Estimated body mass index is 40.62 kg/m as calculated from the following:   Height as of this encounter: 5' 1 (1.549 m).   Weight as of this encounter: 215 lb (97.5 kg).  Risk Assessment: Allergies: Reviewed. She is allergic to metformin  and related.  Allergy Precautions: None required Coagulopathies: Reviewed. None identified.  Blood-thinner therapy: None at this time Active Infection(s): Reviewed. None  identified. Stacy Martinez is afebrile  Site Confirmation: Stacy Martinez was asked to confirm the procedure and laterality before marking  the site Procedure checklist: Completed Consent: Before the procedure and under the influence of no sedative(s), amnesic(s), or anxiolytics, the patient was informed of the treatment options, risks and possible complications. To fulfill our ethical and legal obligations, as recommended by the American Medical Association's Code of Ethics, I have informed the patient of my clinical impression; the nature and purpose of the treatment or procedure; the risks, benefits, and possible complications of the intervention; the alternatives, including doing nothing; the risk(s) and benefit(s) of the alternative treatment(s) or procedure(s); and the risk(s) and benefit(s) of doing nothing. The patient was provided information about the general risks and possible complications associated with the procedure. These may include, but are not limited to: failure to achieve desired goals, infection, bleeding, organ or nerve damage, allergic reactions, paralysis, and death. In addition, the patient was informed of those risks and complications associated to Spine-related procedures, such as failure to decrease pain; infection (i.e.: Meningitis, epidural or intraspinal abscess); bleeding (i.e.: epidural hematoma, subarachnoid hemorrhage, or any other type of intraspinal or peri-dural bleeding); organ or nerve damage (i.e.: Any type of peripheral nerve, nerve root, or spinal cord injury) with subsequent damage to sensory, motor, and/or autonomic systems, resulting in permanent pain, numbness, and/or weakness of one or several areas of the body; allergic reactions; (i.e.: anaphylactic reaction); and/or death. Furthermore, the patient was informed of those risks and complications associated with the medications. These include, but are not limited to: allergic reactions (i.e.: anaphylactic or anaphylactoid  reaction(s)); adrenal axis suppression; blood sugar elevation that in diabetics may result in ketoacidosis or comma; water retention that in patients with history of congestive heart failure may result in shortness of breath, pulmonary edema, and decompensation with resultant heart failure; weight gain; swelling or edema; medication-induced neural toxicity; particulate matter embolism and blood vessel occlusion with resultant organ, and/or nervous system infarction; and/or aseptic necrosis of one or more joints. Finally, the patient was informed that Medicine is not an exact science; therefore, there is also the possibility of unforeseen or unpredictable risks and/or possible complications that may result in a catastrophic outcome. The patient indicated having understood very clearly. We have given the patient no guarantees and we have made no promises. Enough time was given to the patient to ask questions, all of which were answered to the patient's satisfaction. Ms. Radman has indicated that she wanted to continue with the procedure. Attestation: I, the ordering provider, attest that I have discussed with the patient the benefits, risks, side-effects, alternatives, likelihood of achieving goals, and potential problems during recovery for the procedure that I have provided informed consent. Date  Time: 10/05/2024  8:01 AM  Pre-Procedure Preparation:  Monitoring: As per clinic protocol. Respiration, ETCO2, SpO2, BP, heart rate and rhythm monitor placed and checked for adequate function Safety Precautions: Patient was assessed for positional comfort and pressure points before starting the procedure. Time-out: I initiated and conducted the Time-out before starting the procedure, as per protocol. The patient was asked to participate by confirming the accuracy of the Time Out information. Verification of the correct person, site, and procedure were performed and confirmed by me, the nursing staff, and the  patient. Time-out conducted as per Joint Commission's Universal Protocol (UP.01.01.01). Time: 0925  Description of Procedure:          Laterality: Bilateral Levels:  L3, L4, L5, Medial Branch Level(s), at the L3-4, L4-5, lumbar facet joints. Area Prepped: Lumbosacral DuraPrep (Iodine Povacrylex [0.7% available iodine] and Isopropyl Alcohol, 74% w/w) Safety Precautions:  Aspiration looking for blood return was conducted prior to all injections. At no point did we inject any substances, as a needle was being advanced. Before injecting, the patient was told to immediately notify me if she was experiencing any new onset of ringing in the ears, or metallic taste in the mouth. No attempts were made at seeking any paresthesias. Safe injection practices and needle disposal techniques used. Medications properly checked for expiration dates. SDV (single dose vial) medications used. After the completion of the procedure, all disposable equipment used was discarded in the proper designated medical waste containers. Local Anesthesia: Protocol guidelines were followed. The patient was positioned over the fluoroscopy table. The area was prepped in the usual manner. The time-out was completed. The target area was identified using fluoroscopy. A 12-in long, straight, sterile hemostat was used with fluoroscopic guidance to locate the targets for each level blocked. Once located, the skin was marked with an approved surgical skin marker. Once all sites were marked, the skin (epidermis, dermis, and hypodermis), as well as deeper tissues (fat, connective tissue and muscle) were infiltrated with a small amount of a short-acting local anesthetic, loaded on a 10cc syringe with a 25G, 1.5-in  Needle. An appropriate amount of time was allowed for local anesthetics to take effect before proceeding to the next step. Local Anesthetic: Lidocaine  2.0% The unused portion of the local anesthetic was discarded in the proper designated  containers. Technical explanation of process:   Radiofrequency Ablation (RFA) L3 Medial Branch Nerve RFA: The target area for the L3 medial branch is at the junction of the postero-lateral aspect of the superior articular process and the superior, posterior, and medial edge of the transverse process of L4. Under fluoroscopic guidance, a Radiofrequency needle was inserted until contact was made with os over the superior postero-lateral aspect of the pedicular shadow (target area). Sensory and motor testing was conducted to properly adjust the position of the needle. Once satisfactory placement of the needle was achieved, the numbing solution was slowly injected after negative aspiration for blood. 2.0 mL of the nerve block solution was injected without difficulty or complication. After waiting for at least 3 minutes, the ablation was performed. Once completed, the needle was removed intact. L4 Medial Branch Nerve RFA: The target area for the L4 medial branch is at the junction of the postero-lateral aspect of the superior articular process and the superior, posterior, and medial edge of the transverse process of L5. Under fluoroscopic guidance, a Radiofrequency needle was inserted until contact was made with os over the superior postero-lateral aspect of the pedicular shadow (target area). Sensory and motor testing was conducted to properly adjust the position of the needle. Once satisfactory placement of the needle was achieved, the numbing solution was slowly injected after negative aspiration for blood. 2.0 mL of the nerve block solution was injected without difficulty or complication. After waiting for at least 3 minutes, the ablation was performed. Once completed, the needle was removed intact. L5 Medial Branch Nerve RFA: The target area for the L5 medial branch is at the junction of the postero-lateral aspect of the superior articular process of S1 and the superior, posterior, and medial edge of the  sacral ala. Under fluoroscopic guidance, a Radiofrequency needle was inserted until contact was made with os over the superior postero-lateral aspect of the pedicular shadow (target area). Sensory and motor testing was conducted to properly adjust the position of the needle. Once satisfactory placement of the needle was achieved, the numbing solution  was slowly injected after negative aspiration for blood. 2.0 mL of the nerve block solution was injected without difficulty or complication. After waiting for at least 3 minutes, the ablation was performed. Once completed, the needle was removed intact.  Radiofrequency lesioning (ablation):  Radiofrequency Generator: Medtronic Sensory Stimulation Parameters: 50 Hz was used to locate & identify the nerve, making sure that the needle was positioned such that there was no sensory stimulation below 0.3 V or above 0.7 V. Motor Stimulation Parameters: 2 Hz was used to evaluate the motor component. Care was taken not to lesion any nerves that demonstrated motor stimulation of the lower extremities at an output of less than 2.5 times that of the sensory threshold, or a maximum of 2.0 V. Lesioning Technique Parameters: Standard Radiofrequency settings. (Not bipolar or pulsed.) Temperature Settings: 80 degrees C Lesioning time: 60 seconds Intra-operative Compliance: Compliant Materials & Medications: Needle(s) (Electrode/Cannula) Type: Teflon-coated, curved tip, Radiofrequency needle(s) Gauge: 22G Length: 10cm Numbing solution: 6 cc solution made of  5 cc of 0.2% ropivacaine , 1 cc of Decadron  10 mg/cc.  2 cc injected at each level above for  the RIGHT side after sensorimotor testing, prior to lesioning.                       6 cc solution made of  5 cc of 0.2% ropivacaine , 1 cc of Decadron  10 mg/cc.  2 cc injected at each level above for  the LEFT  side after sensorimotor testing, prior to lesioning.   Once the entire procedure was completed, the treated area  was cleaned, making sure to leave some of the prepping solution back to take advantage of its long term bactericidal properties.    Illustration of the posterior view of the lumbar spine and the posterior neural structures. Laminae of L2 through S1 are labeled. DPRL5, dorsal primary ramus of L5; DPRS1, dorsal primary ramus of S1; DPR3, dorsal primary ramus of L3; FJ, facet (zygapophyseal) joint L3-L4; I, inferior articular process of L4; LB1, lateral branch of dorsal primary ramus of L1; IAB, inferior articular branches from L3 medial branch (supplies L4-L5 facet joint); IBP, intermediate branch plexus; MB3, medial branch of dorsal primary ramus of L3; NR3, third lumbar nerve root; S, superior articular process of L5; SAB, superior articular branches from L4 (supplies L4-5 facet joint also); TP3, transverse process of L3.  Vitals:   10/05/24 0940 10/05/24 0945 10/05/24 0952 10/05/24 0959  BP: 109/70 115/72 116/76 111/75  Pulse:      Resp: 17 16 18    Temp:      SpO2: 96% 97% 95% 100%  Weight:      Height:        Start Time: 0925 hrs. End Time: 0950 hrs.  Imaging Guidance (Spinal):          Type of Imaging Technique: Fluoroscopy Guidance (Spinal) Indication(s): Assistance in needle guidance and placement for procedures requiring needle placement in or near specific anatomical locations not easily accessible without such assistance. Exposure Time: Please see nurses notes. Contrast: None used. Fluoroscopic Guidance: I was personally present during the use of fluoroscopy. Tunnel Vision Technique used to obtain the best possible view of the target area. Parallax error corrected before commencing the procedure. Direction-depth-direction technique used to introduce the needle under continuous pulsed fluoroscopy. Once target was reached, antero-posterior, oblique, and lateral fluoroscopic projection used confirm needle placement in all planes. Images permanently stored in EMR. Interpretation: No  contrast injected. I personally interpreted  the imaging intraoperatively. Adequate needle placement confirmed in multiple planes. Permanent images saved into the patient's record.   Post-operative Assessment:  Post-procedure Vital Signs:  Pulse/HCG Rate: 7072 Temp: 98.3 F (36.8 C) Resp: 18 BP: 111/75 SpO2: 100 %  EBL: None  Complications: No immediate post-treatment complications observed by team, or reported by patient.  Note: The patient tolerated the entire procedure well. A repeat set of vitals were taken after the procedure and the patient was kept under observation following institutional policy, for this type of procedure. Post-procedural neurological assessment was performed, showing return to baseline, prior to discharge. The patient was provided with post-procedure discharge instructions, including a section on how to identify potential problems. Should any problems arise concerning this procedure, the patient was given instructions to immediately contact us , at any time, without hesitation. In any case, we plan to contact the patient by telephone for a follow-up status report regarding this interventional procedure.  Comments:  No additional relevant information.  Plan of Care  Orders:  Orders Placed This Encounter  Procedures   DG PAIN CLINIC C-ARM 1-60 MIN NO REPORT    Intraoperative interpretation by procedural physician at [facility].    Standing Status:   Standing    Number of Occurrences:   1    Reason for exam::   Assistance in needle guidance and placement for procedures requiring needle placement in or near specific anatomical locations not easily accessible without such assistance.   Medications ordered for procedure: Meds ordered this encounter  Medications   lidocaine  (XYLOCAINE ) 2 % (with pres) injection 400 mg   lactated ringers  infusion   fentaNYL  (SUBLIMAZE ) injection 25-50 mcg    Make sure Narcan is available in the pyxis when using this  medication. In the event of respiratory depression (RR< 8/min): Titrate NARCAN (naloxone) in increments of 0.1 to 0.2 mg IV at 2-3 minute intervals, until desired degree of reversal.   midazolam  PF (VERSED ) injection 0.5-2 mg    Make sure Flumazenil is available in the pyxis when using this medication. If oversedation occurs, administer 0.2 mg IV over 15 sec. If after 45 sec no response, administer 0.2 mg again over 1 min; may repeat at 1 min intervals; not to exceed 4 doses (1 mg)   ropivacaine  (PF) 2 mg/mL (0.2%) (NAROPIN ) injection 18 mL   dexamethasone  (DECADRON ) injection 20 mg   Medications administered: We administered lidocaine , lactated ringers , fentaNYL , midazolam  PF, ropivacaine  (PF) 2 mg/mL (0.2%), and dexamethasone .  See the medical record for exact dosing, route, and time of administration.  Follow-up plan:   Return for Keep sch. appt.     Recent Visits Date Type Provider Dept  08/10/24 Office Visit Patel, Seema K, NP [facility]  Showing recent visits within past 90 days and meeting all other requirements Today's Visits Date Type Provider Dept  10/05/24 Procedure visit Marcelino Nurse, MD [facility]  Showing today's visits and meeting all other requirements Future Appointments Date Type Provider Dept  11/30/24 Appointment Patel, Seema K, NP [facility]  Showing future appointments within next 90 days and meeting all other requirements  Disposition: Discharge home  Discharge (Date  Time): 10/05/2024; 1006 hrs.   Primary Care Physician: Antonette Angeline ORN, NP Location: [facility] Note by: Nurse Marcelino, MD Date: 10/05/2024; Time: 10:56 AM  Disclaimer:  Medicine is not an exact science. The only guarantee in medicine is that nothing is guaranteed. It is important to note that the  decision to proceed with this intervention was based on the information collected from the patient. The Data and conclusions were drawn from  the patient's questionnaire, the interview, and the physical examination. Because the information was provided in large part by the patient, it cannot be guaranteed that it has not been purposely or unconsciously manipulated. Every effort has been made to obtain as much relevant data as possible for this evaluation. It is important to note that the conclusions that lead to this procedure are derived in large part from the available data. Always take into account that the treatment will also be dependent on availability of resources and existing treatment guidelines, considered by other Pain Management Practitioners as being common knowledge and practice, at the time of the intervention. For Medico-Legal purposes, it is also important to point out that variation in procedural techniques and pharmacological choices are the acceptable norm. The indications, contraindications, technique, and results of the above procedure should only be interpreted and judged by a Board-Certified Interventional Pain Specialist with extensive familiarity and expertise in the same exact procedure and technique.

## 2024-10-05 NOTE — Progress Notes (Signed)
 Safety precautions to be maintained throughout the outpatient stay will include: orient to surroundings, keep bed in low position, maintain call bell within reach at all times, provide assistance with transfer out of bed and ambulation.

## 2024-10-05 NOTE — Telephone Encounter (Signed)
 Requested medication (s) are due for refill today: yes  Requested medication (s) are on the active medication list: yes  Last refill:  09/09/24  Future visit scheduled: {Yes  Notes to clinic:  Medication not assigned to a protocol, review manually.      Requested Prescriptions  Pending Prescriptions Disp Refills   MOUNJARO  2.5 MG/0.5ML Pen [Pharmacy Med Name: Mounjaro  2.5 MG/0.5ML Subcutaneous Solution Auto-injector] 4 mL 0    Sig: INJECT  2.5 MG  SUBCUTANEOUSLY ONCE A WEEK     Off-Protocol Failed - 10/05/2024  2:38 PM      Failed - Medication not assigned to a protocol, review manually.      Passed - Valid encounter within last 12 months    Recent Outpatient Visits           1 month ago Encounter for general adult medical examination with abnormal findings   [place] [facility] [place], Angeline ORN, NP   7 months ago Bilateral primary osteoarthritis of knee   [place] [facility] [place], Angeline ORN, NP   10 months ago Urinary urgency   [place] [facility] [place], Angeline ORN, NP   10 months ago DM type 2 with diabetic peripheral neuropathy [facility])   [place] [facility] [place], Angeline ORN, [place]

## 2024-10-06 ENCOUNTER — Telehealth: Payer: Self-pay | Admitting: *Deleted

## 2024-10-06 NOTE — Telephone Encounter (Signed)
 No problems post procedure.

## 2024-11-30 ENCOUNTER — Encounter: Admitting: Nurse Practitioner

## 2025-02-16 ENCOUNTER — Ambulatory Visit: Admitting: Internal Medicine
